# Patient Record
Sex: Female | Born: 1965
Health system: Southern US, Community
[De-identification: ages and names within clinical notes are randomized; demographics above are authoritative.]

## PROBLEM LIST (undated history)

## (undated) DIAGNOSIS — M1990 Unspecified osteoarthritis, unspecified site: Secondary | ICD-10-CM

## (undated) DIAGNOSIS — E785 Hyperlipidemia, unspecified: Secondary | ICD-10-CM

## (undated) DIAGNOSIS — C50919 Malignant neoplasm of unspecified site of unspecified female breast: Secondary | ICD-10-CM

## (undated) DIAGNOSIS — I10 Essential (primary) hypertension: Secondary | ICD-10-CM

## (undated) HISTORY — DX: Hyperlipidemia, unspecified: E78.5

## (undated) HISTORY — DX: Malignant neoplasm of unspecified site of unspecified female breast: C50.919

## (undated) HISTORY — DX: Unspecified osteoarthritis, unspecified site: M19.90

## (undated) HISTORY — DX: Essential (primary) hypertension: I10

---

## 2000-03-01 ENCOUNTER — Encounter: Payer: Self-pay | Admitting: Family Medicine

## 2000-03-01 ENCOUNTER — Ambulatory Visit (HOSPITAL_COMMUNITY): Admission: RE | Admit: 2000-03-01 | Discharge: 2000-03-01 | Payer: Self-pay | Admitting: Family Medicine

## 2001-04-15 ENCOUNTER — Encounter: Admission: RE | Admit: 2001-04-15 | Discharge: 2001-04-15 | Payer: Self-pay | Admitting: Obstetrics & Gynecology

## 2001-05-20 ENCOUNTER — Encounter: Admission: RE | Admit: 2001-05-20 | Discharge: 2001-05-20 | Payer: Self-pay | Admitting: Obstetrics & Gynecology

## 2002-07-07 ENCOUNTER — Encounter: Payer: Self-pay | Admitting: *Deleted

## 2002-07-07 ENCOUNTER — Inpatient Hospital Stay (HOSPITAL_COMMUNITY): Admission: AD | Admit: 2002-07-07 | Discharge: 2002-07-10 | Payer: Self-pay | Admitting: *Deleted

## 2002-07-09 ENCOUNTER — Encounter (INDEPENDENT_AMBULATORY_CARE_PROVIDER_SITE_OTHER): Payer: Self-pay | Admitting: Specialist

## 2002-07-16 ENCOUNTER — Encounter: Admission: RE | Admit: 2002-07-16 | Discharge: 2002-07-16 | Payer: Self-pay | Admitting: Obstetrics and Gynecology

## 2002-07-30 ENCOUNTER — Encounter: Admission: RE | Admit: 2002-07-30 | Discharge: 2002-07-30 | Payer: Self-pay | Admitting: Obstetrics and Gynecology

## 2002-08-20 ENCOUNTER — Encounter: Admission: RE | Admit: 2002-08-20 | Discharge: 2002-08-20 | Payer: Self-pay | Admitting: Obstetrics and Gynecology

## 2002-10-29 ENCOUNTER — Encounter: Admission: RE | Admit: 2002-10-29 | Discharge: 2002-10-29 | Payer: Self-pay | Admitting: Obstetrics and Gynecology

## 2002-11-24 ENCOUNTER — Encounter: Admission: RE | Admit: 2002-11-24 | Discharge: 2002-11-24 | Payer: Self-pay | Admitting: Obstetrics and Gynecology

## 2003-05-11 ENCOUNTER — Encounter: Admission: RE | Admit: 2003-05-11 | Discharge: 2003-05-11 | Payer: Self-pay | Admitting: Obstetrics and Gynecology

## 2003-07-15 ENCOUNTER — Encounter: Admission: RE | Admit: 2003-07-15 | Discharge: 2003-07-15 | Payer: Self-pay | Admitting: Obstetrics and Gynecology

## 2003-08-26 ENCOUNTER — Encounter: Admission: RE | Admit: 2003-08-26 | Discharge: 2003-08-26 | Payer: Self-pay | Admitting: Obstetrics and Gynecology

## 2003-10-07 ENCOUNTER — Encounter: Admission: RE | Admit: 2003-10-07 | Discharge: 2003-10-07 | Payer: Self-pay | Admitting: Obstetrics and Gynecology

## 2004-01-11 ENCOUNTER — Encounter: Admission: RE | Admit: 2004-01-11 | Discharge: 2004-01-11 | Payer: Self-pay | Admitting: Obstetrics and Gynecology

## 2004-01-13 ENCOUNTER — Encounter: Admission: RE | Admit: 2004-01-13 | Discharge: 2004-01-13 | Payer: Self-pay | Admitting: Obstetrics and Gynecology

## 2004-02-29 ENCOUNTER — Encounter: Admission: RE | Admit: 2004-02-29 | Discharge: 2004-02-29 | Payer: Self-pay | Admitting: Obstetrics and Gynecology

## 2004-05-02 ENCOUNTER — Encounter: Admission: RE | Admit: 2004-05-02 | Discharge: 2004-05-02 | Payer: Self-pay | Admitting: Obstetrics and Gynecology

## 2004-08-10 ENCOUNTER — Inpatient Hospital Stay (HOSPITAL_COMMUNITY): Admission: AD | Admit: 2004-08-10 | Discharge: 2004-08-10 | Payer: Self-pay | Admitting: Obstetrics and Gynecology

## 2004-08-15 ENCOUNTER — Ambulatory Visit (HOSPITAL_COMMUNITY): Admission: RE | Admit: 2004-08-15 | Discharge: 2004-08-15 | Payer: Self-pay | Admitting: Obstetrics and Gynecology

## 2004-08-15 ENCOUNTER — Ambulatory Visit: Payer: Self-pay | Admitting: Obstetrics and Gynecology

## 2004-08-15 IMAGING — US US PELVIS COMPLETE MODIFY
1 series · 14 of 25 positions shown · non-contrast
Comparison: none

CLINICAL DATA: Pelvic pain.  
 ULTRASOUND OF THE PELVIS 
 Both transabdominal and transvaginal ultrasound of the pelvis were performed.  The uterus is normal in size measuring 8.9 cm sagittally with a depth of 3.1 cm and width of 4.7 cm.  The endometrium is normal measuring 5.9 mm in thickness.  No myometrial abnormality is seen.  Both ovaries are normal in size.  No free fluid is seen.  
 IMPRESSION
 Negative pelvic ultrasound.

[Series 1: us pelvis complete modify · 0.33mm/px · 14 of 43 slices shown]
[im 1/43]
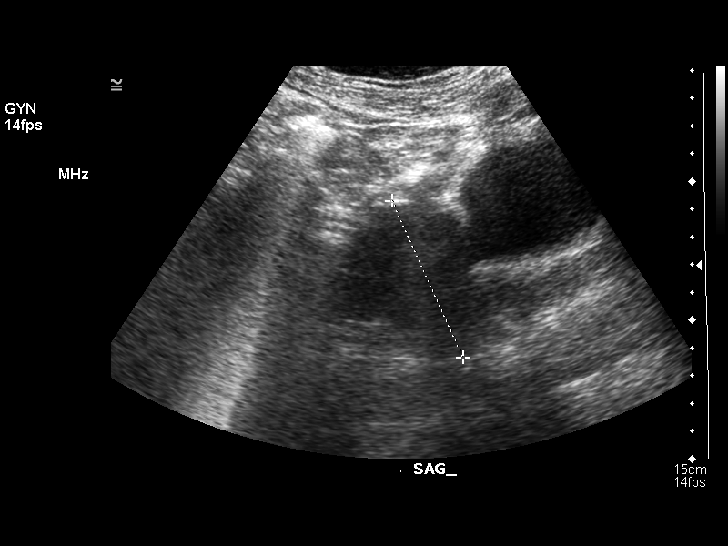
[im 4/43]
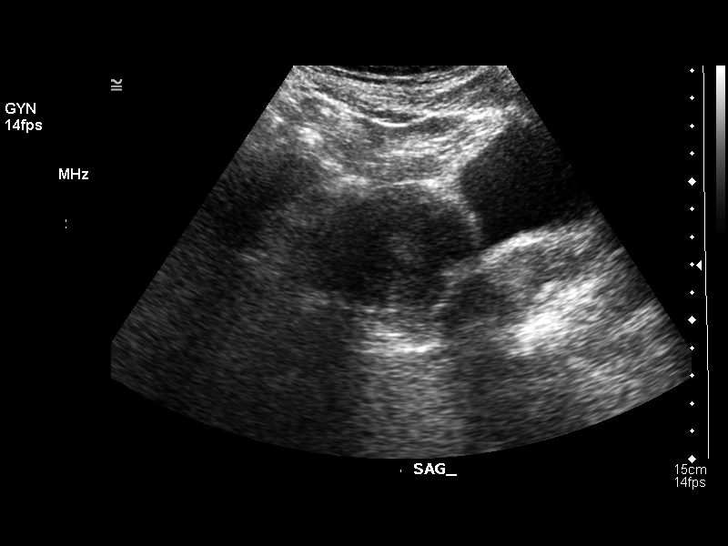
[im 8/43]
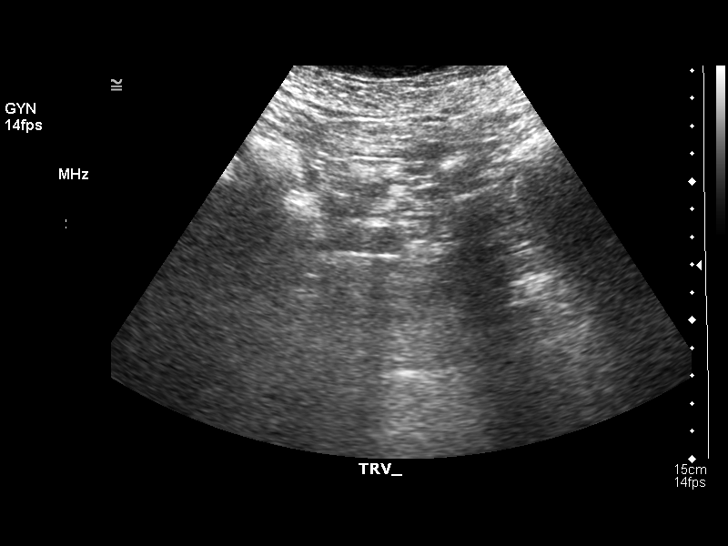
[im 11/43]
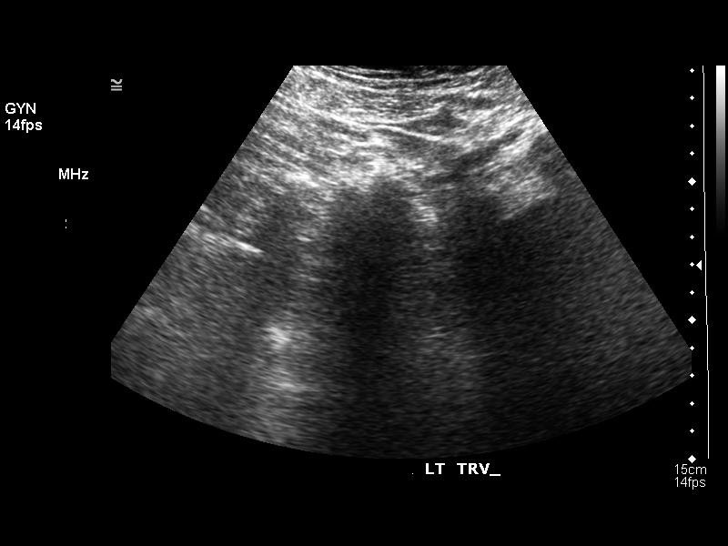
[im 15/43]
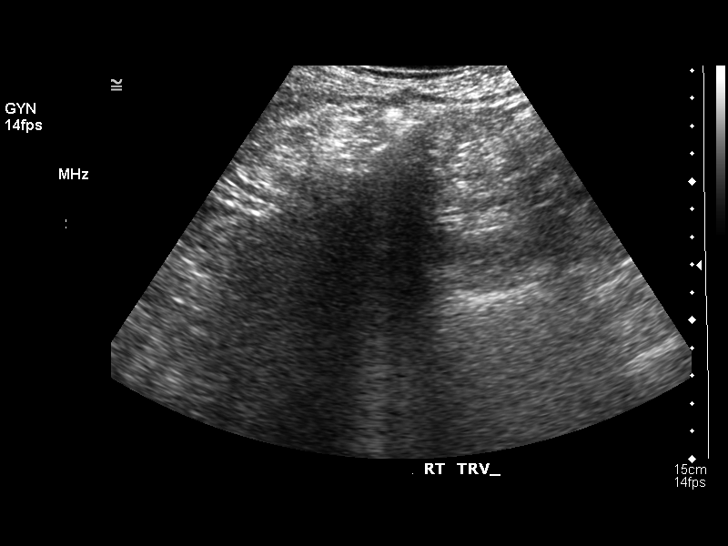
[im 16/43]
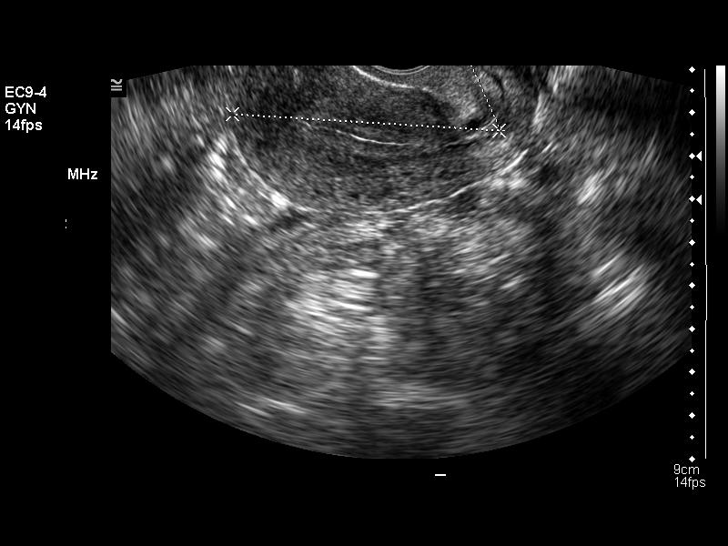
[im 20/43]
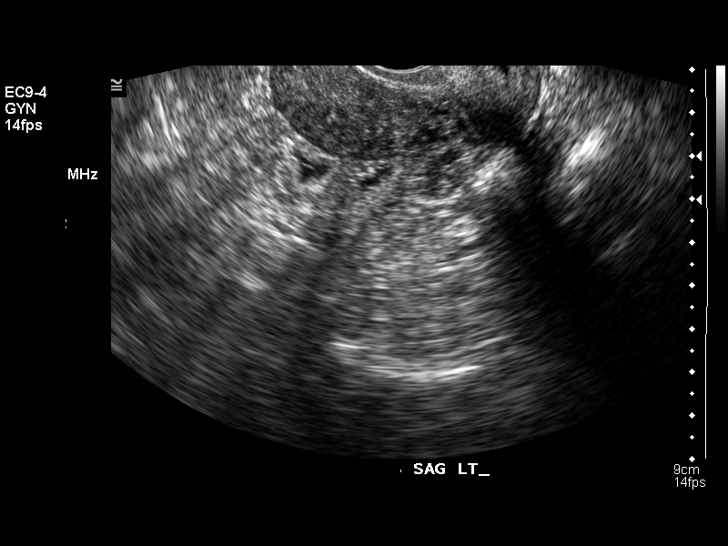
[im 23/43]
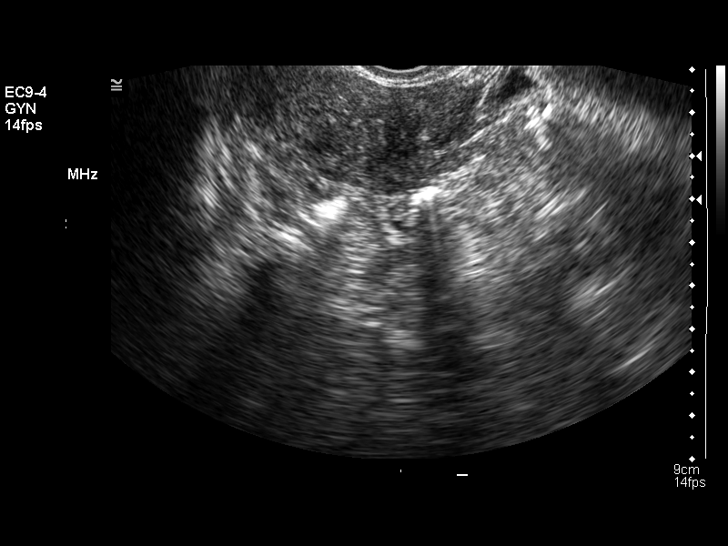
[im 27/43]
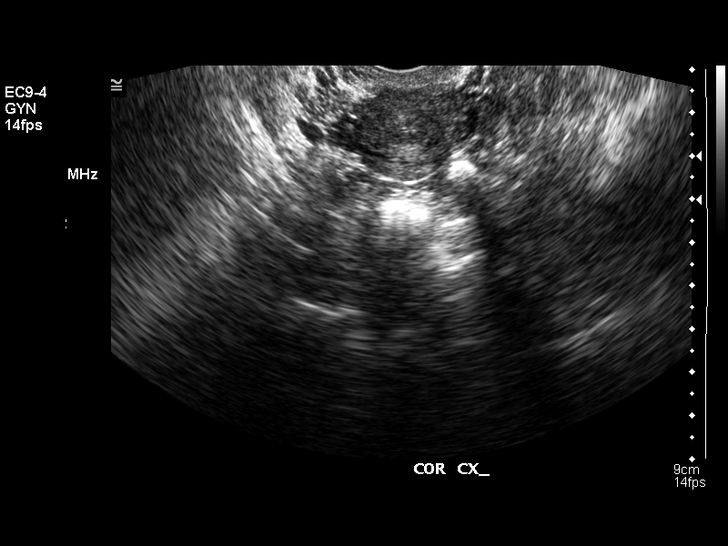
[im 29/43]
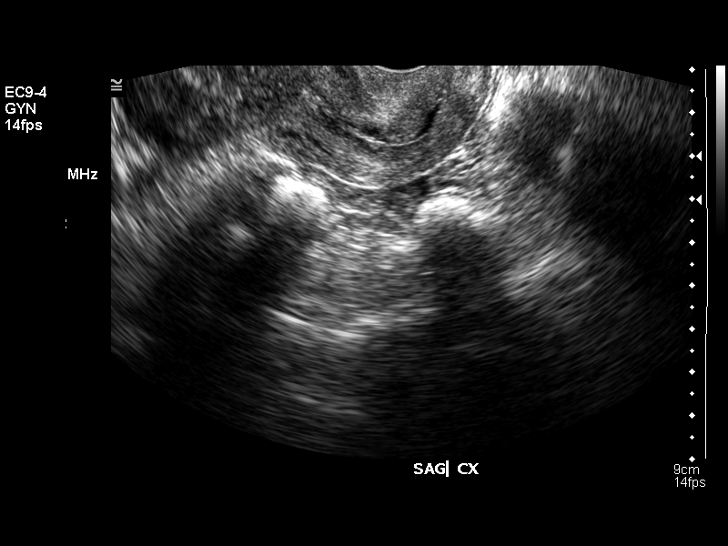
[im 32/43]
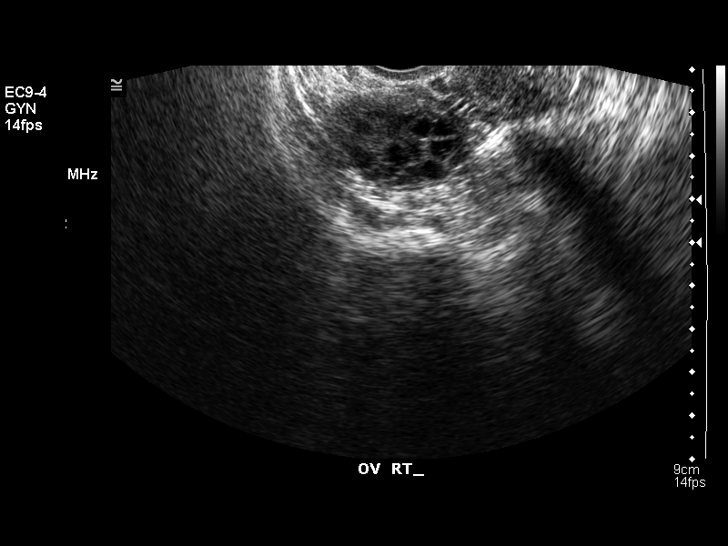
[im 36/43]
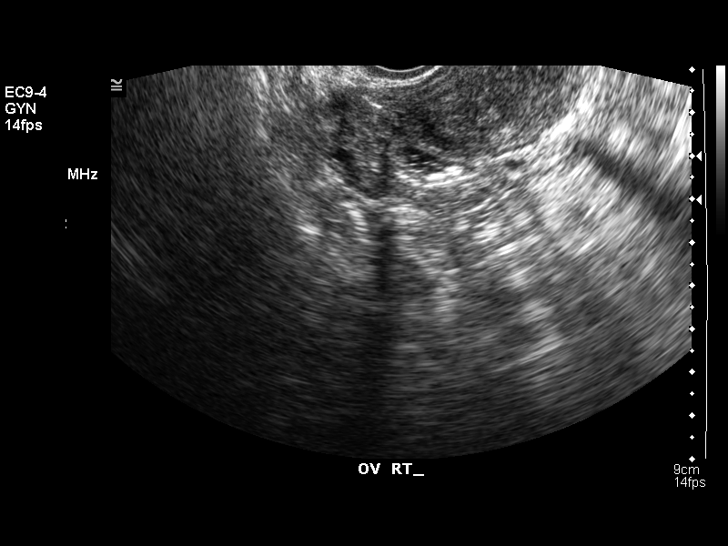
[im 39/43]
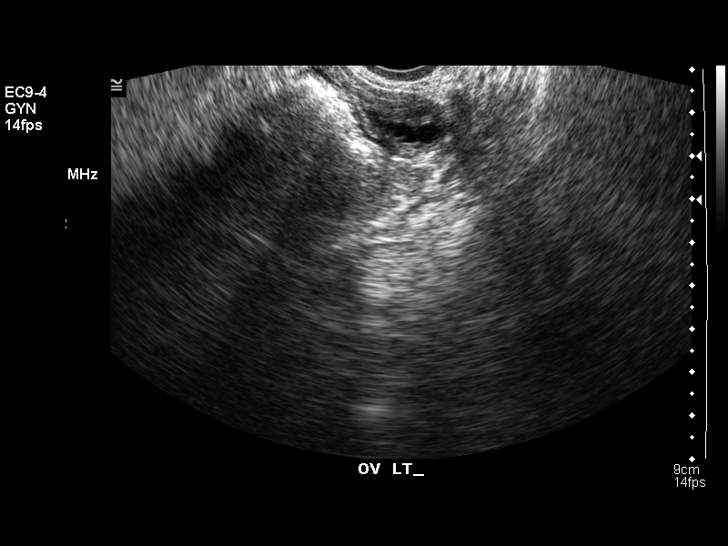
[im 43/43]
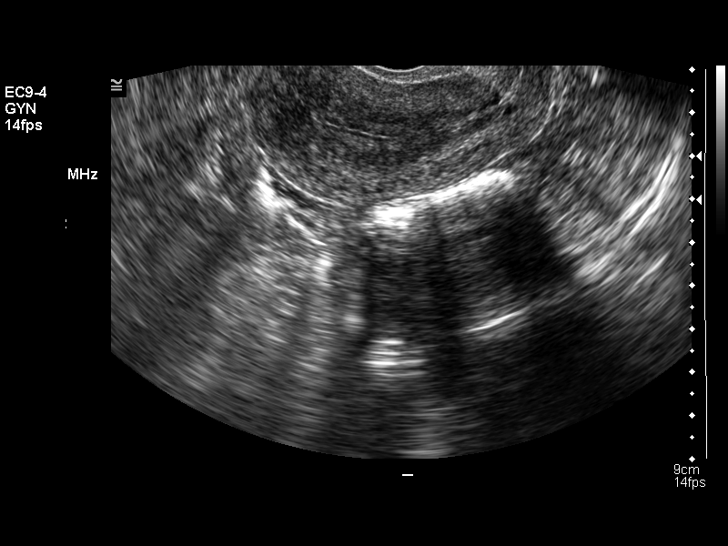

[14 of 25 positions shown; findings below may reference images not displayed]

## 2005-01-16 ENCOUNTER — Emergency Department (HOSPITAL_COMMUNITY): Admission: EM | Admit: 2005-01-16 | Discharge: 2005-01-17 | Payer: Self-pay | Admitting: Emergency Medicine

## 2005-02-17 ENCOUNTER — Emergency Department (HOSPITAL_COMMUNITY): Admission: EM | Admit: 2005-02-17 | Discharge: 2005-02-17 | Payer: Self-pay | Admitting: Emergency Medicine

## 2005-09-24 ENCOUNTER — Emergency Department (HOSPITAL_COMMUNITY): Admission: EM | Admit: 2005-09-24 | Discharge: 2005-09-24 | Payer: Self-pay | Admitting: Emergency Medicine

## 2005-09-24 IMAGING — CR DG CHEST 2V
2 series · 2 of 2 positions shown · non-contrast
Comparison: None.

CLINICAL DATA: Cough.
CHEST- 2 VIEW:

[view not recorded (1 of 2)]
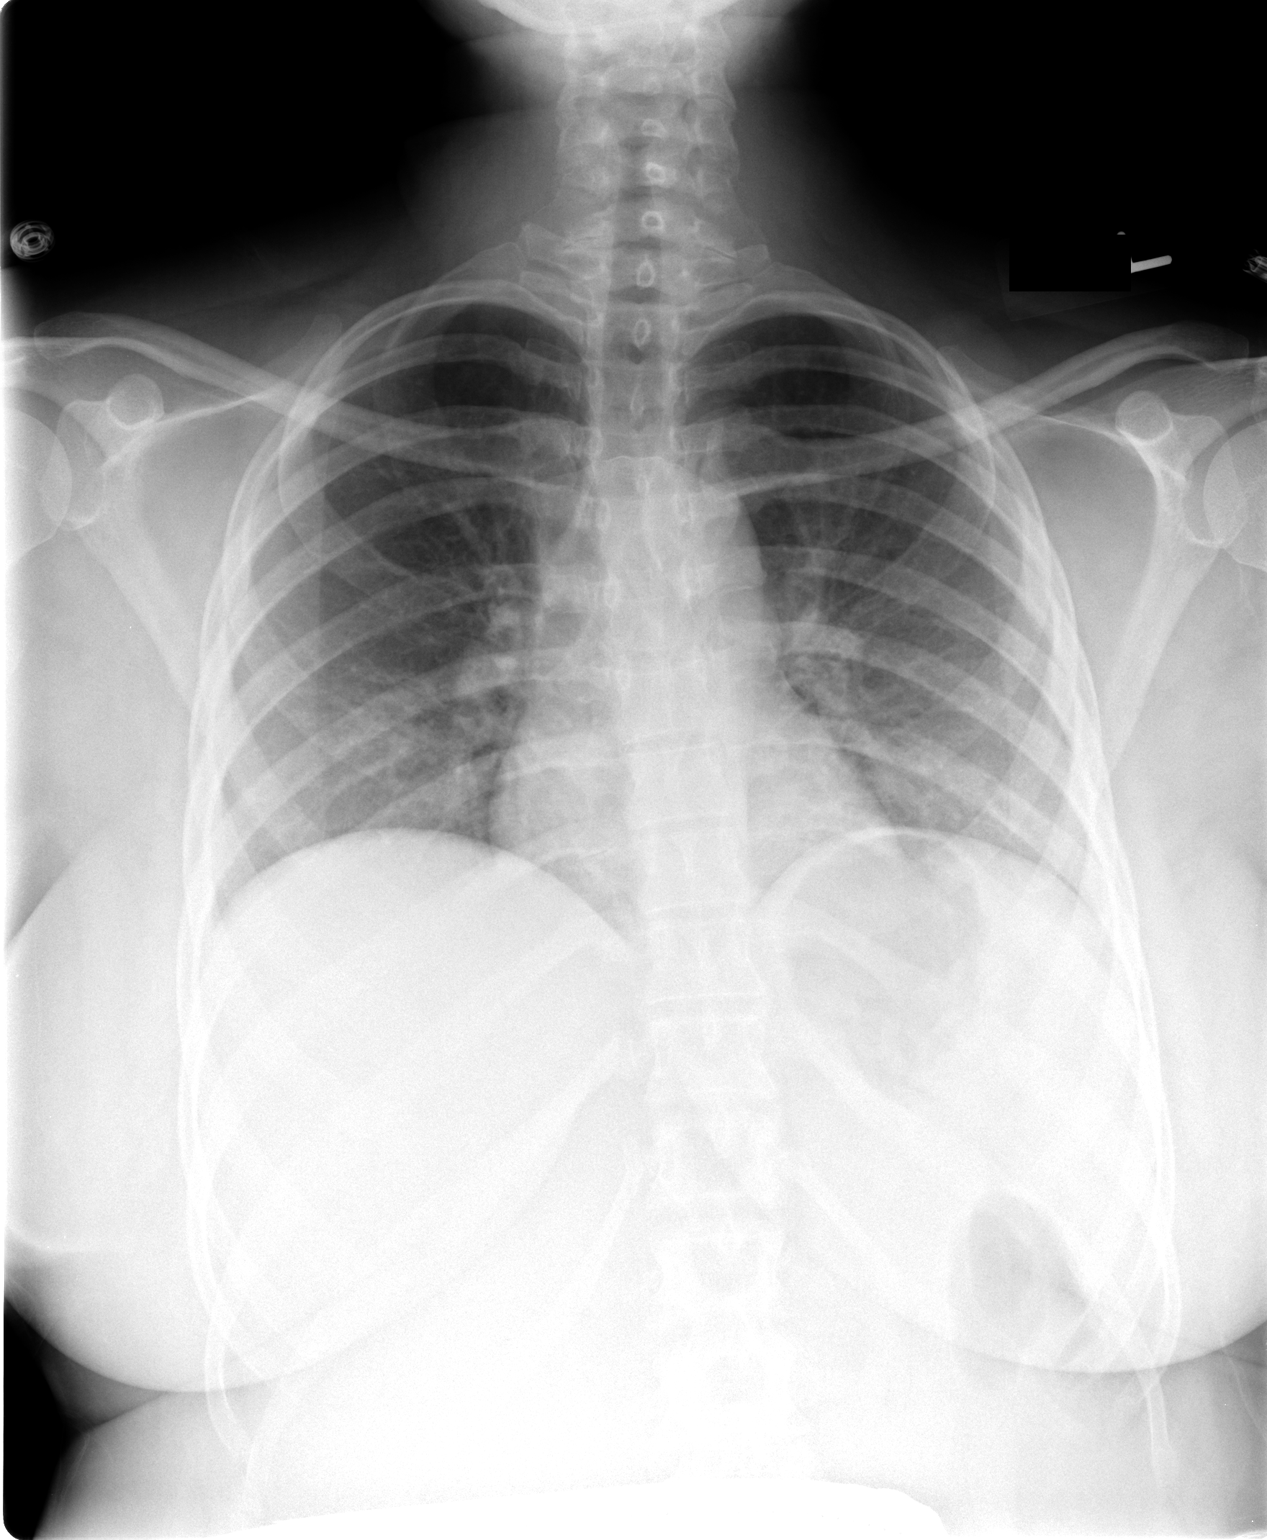

[view not recorded (2 of 2)]
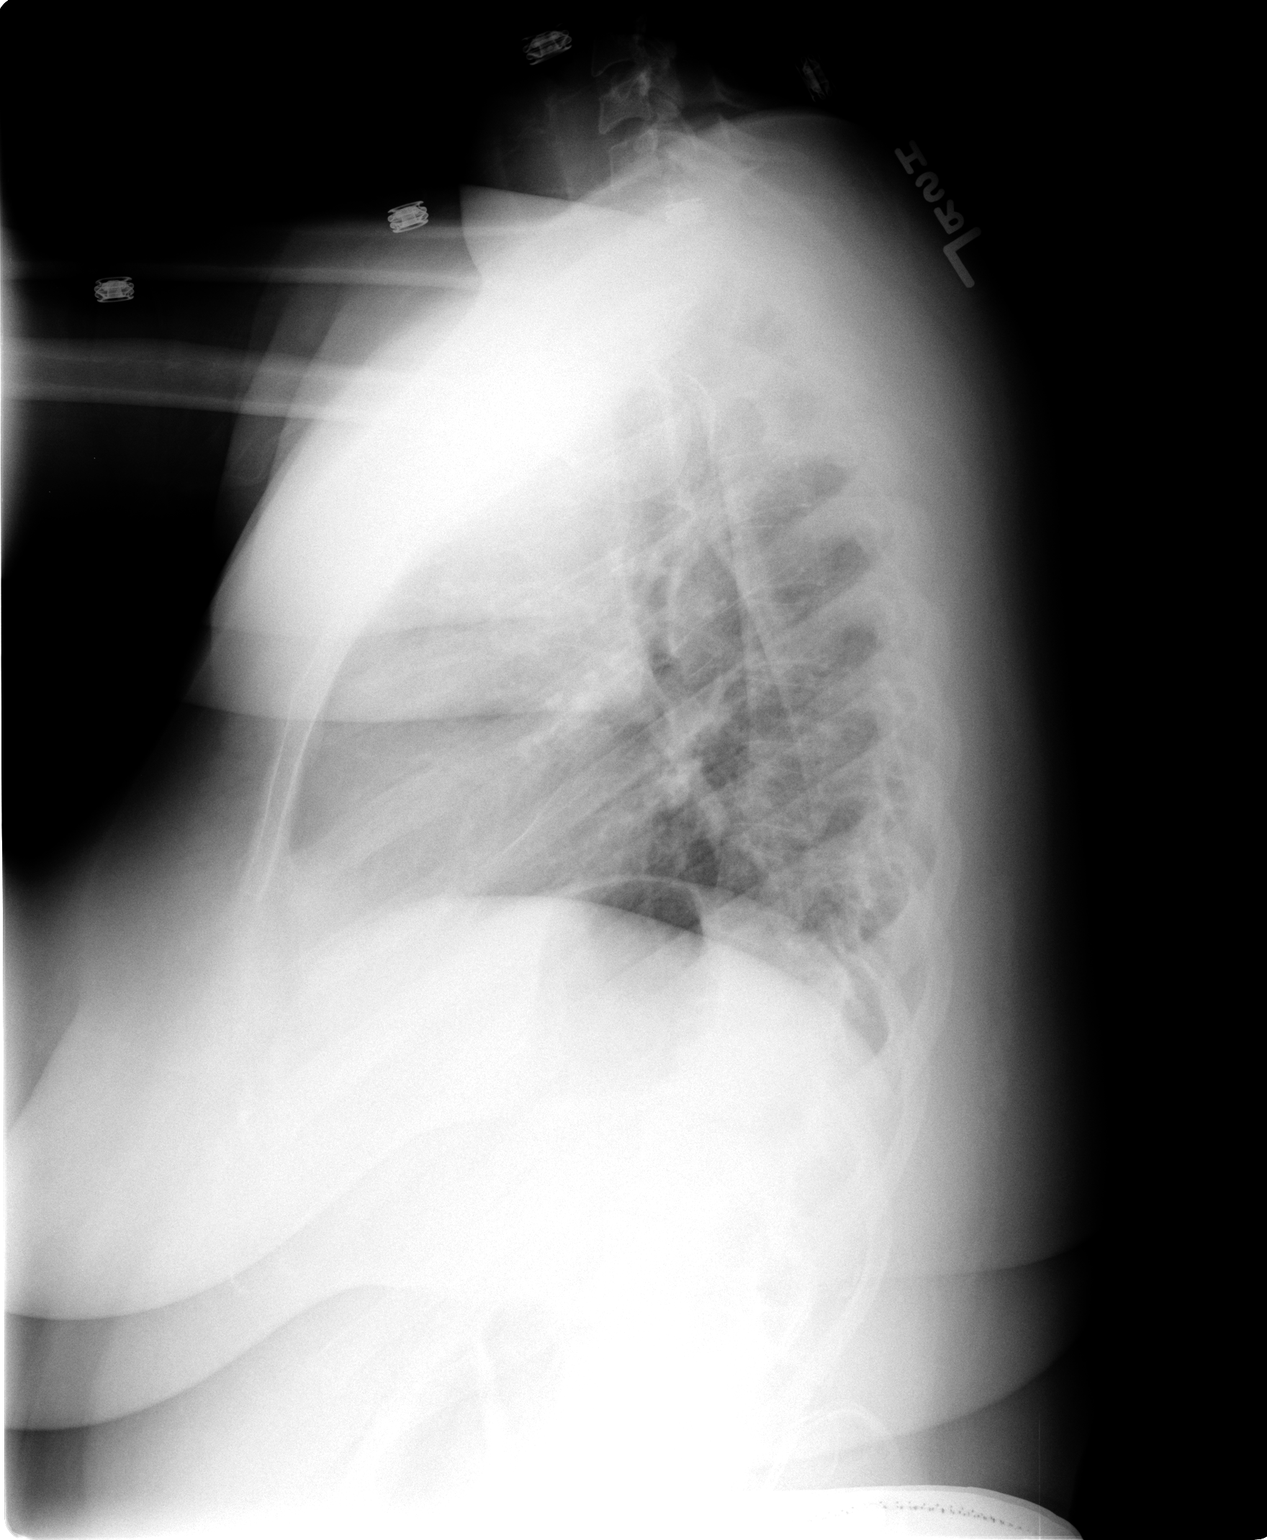

[2 of 2 positions shown; findings below may reference images not displayed]

FINDINGS: Low level of inspiration. Heart and lungs within normal limits considering. Osseous structures intact.
IMPRESSION: Suboptimal  inspiration ? no active disease.

## 2006-12-25 ENCOUNTER — Inpatient Hospital Stay (HOSPITAL_COMMUNITY): Admission: AD | Admit: 2006-12-25 | Discharge: 2006-12-26 | Payer: Self-pay | Admitting: Obstetrics and Gynecology

## 2006-12-25 IMAGING — US US TRANSVAGINAL NON-OB
1 series · 14 of 25 positions shown · non-contrast
Comparison: [DATE].

CLINICAL DATA: Vaginal bleeding and severe pelvic pain. 
 TRANSABDOMINAL AND TRANSVAGINAL PELVIC ULTRASOUND:
TECHNIQUE: Both transabdominal and transvaginal ultrasound examinations of the pelvis were performed including evaluation of the uterus, ovaries, adnexal regions, and pelvic cul-de-sac.

[Series 1: us transvaginal non-ob · 0.28mm/px · 14 of 30 slices shown]
[im 1/30]
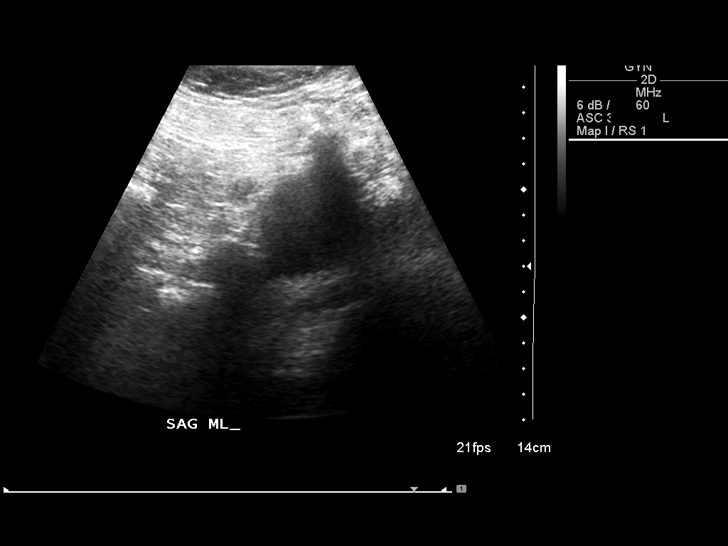
[im 3/30]
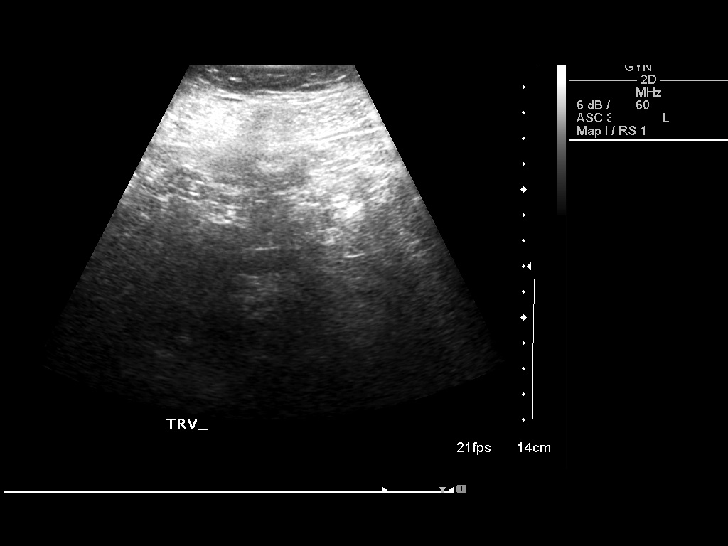
[im 5/30]
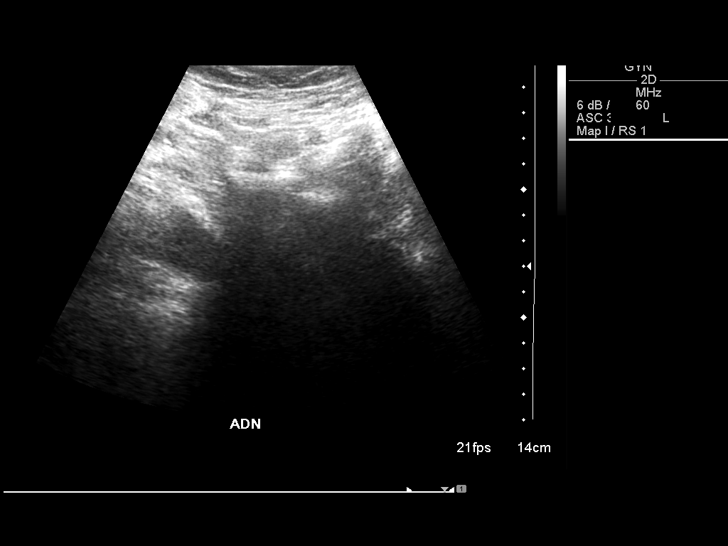
[im 8/30]
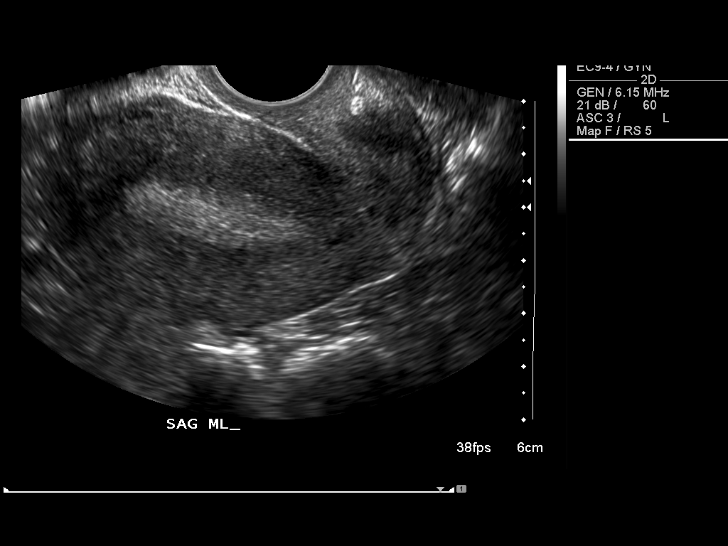
[im 10/30]
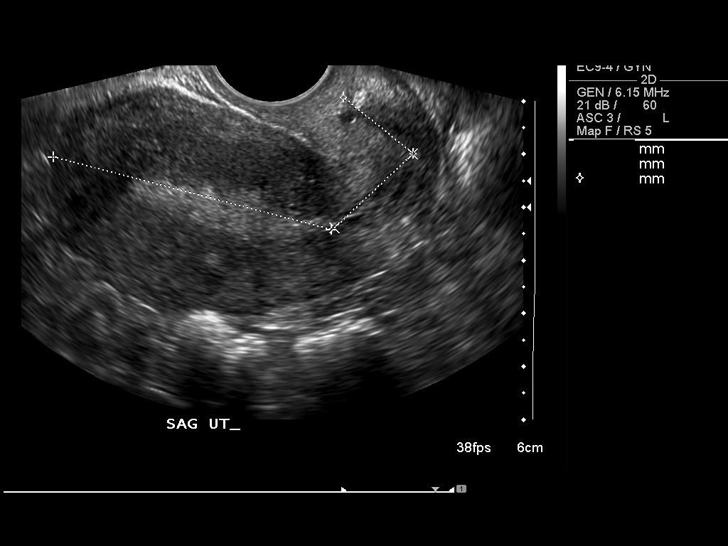
[im 11/30]
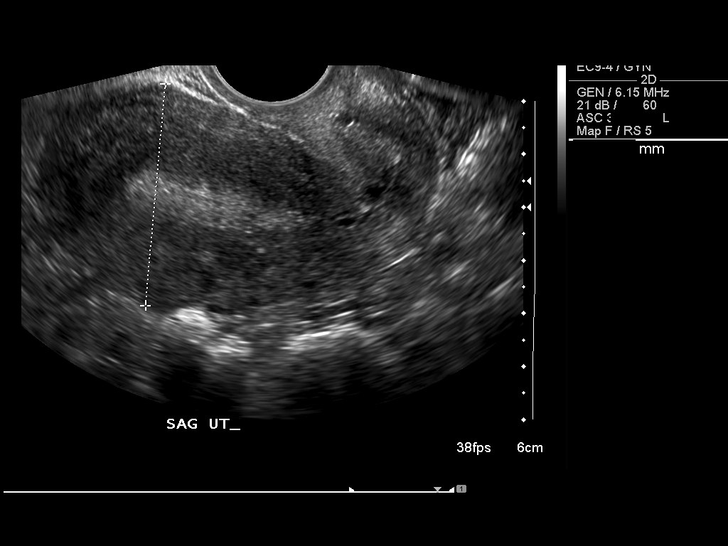
[im 14/30]
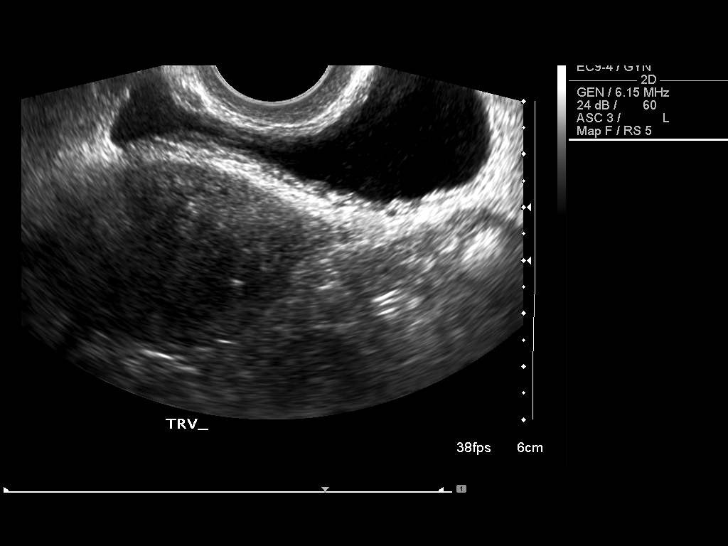
[im 16/30]
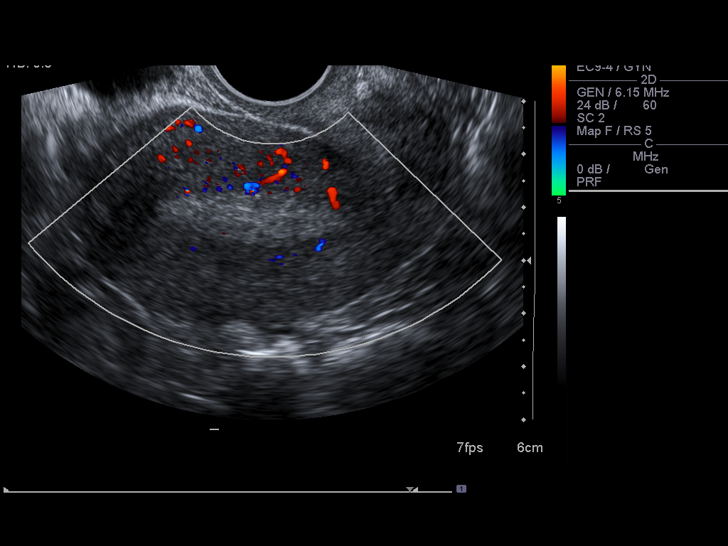
[im 19/30]
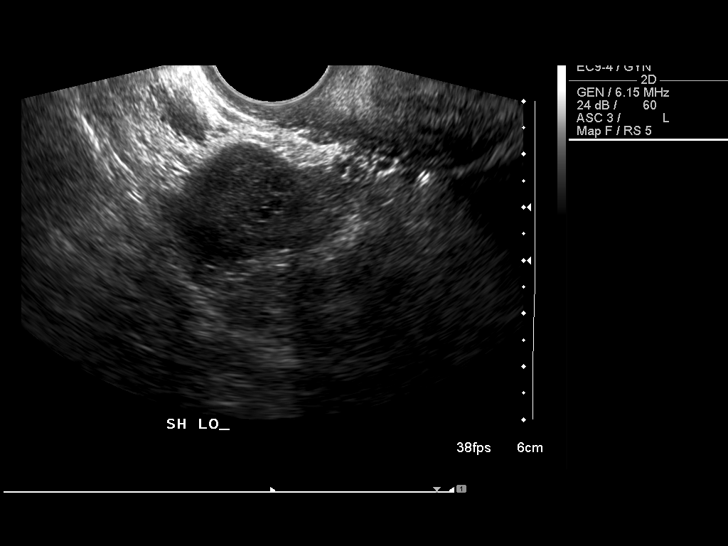
[im 20/30]
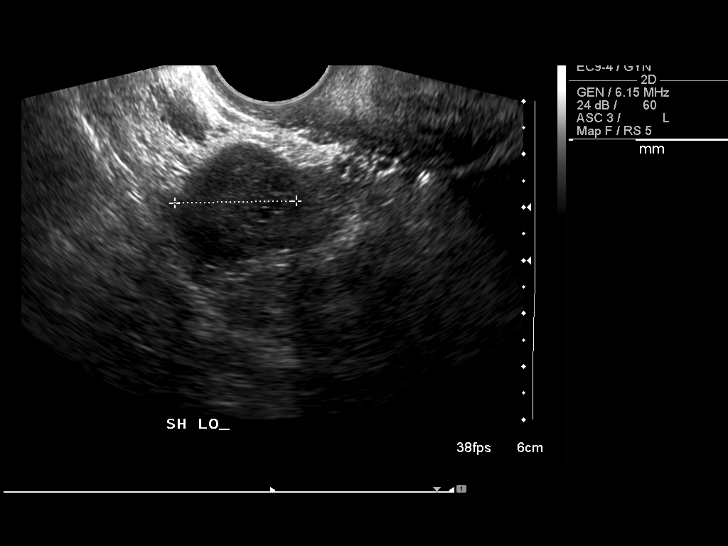
[im 22/30]
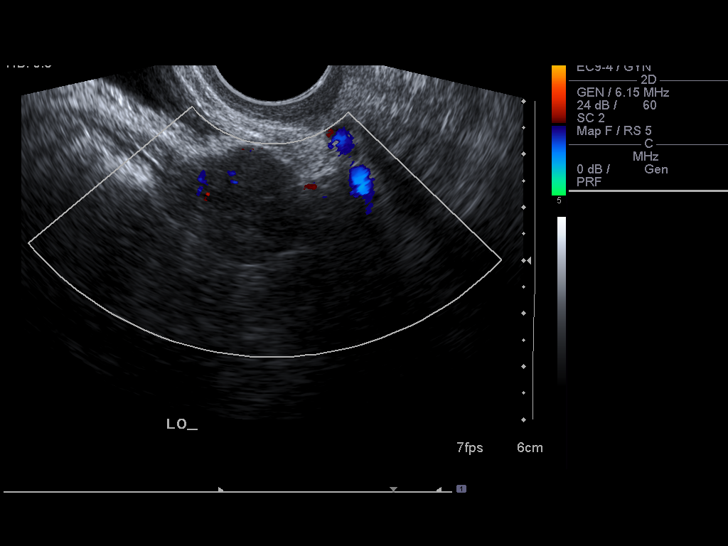
[im 25/30]
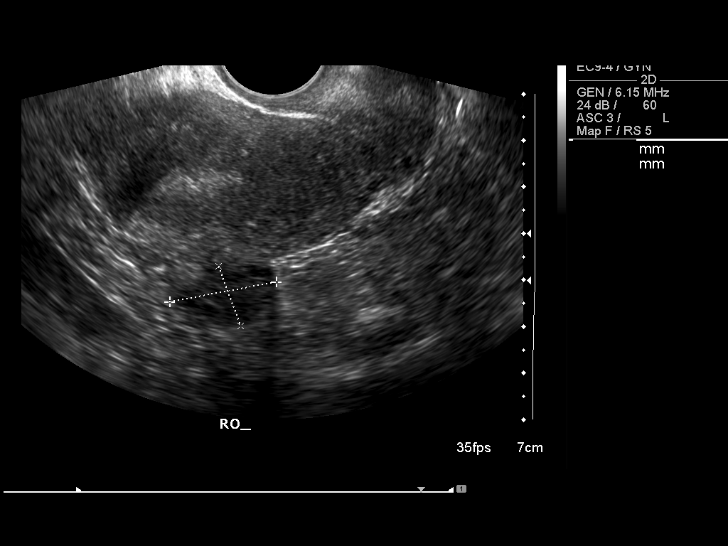
[im 27/30]
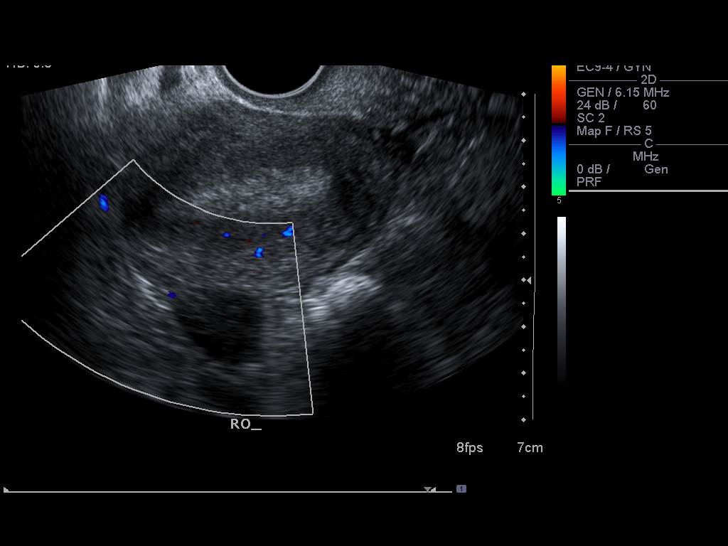
[im 30/30]
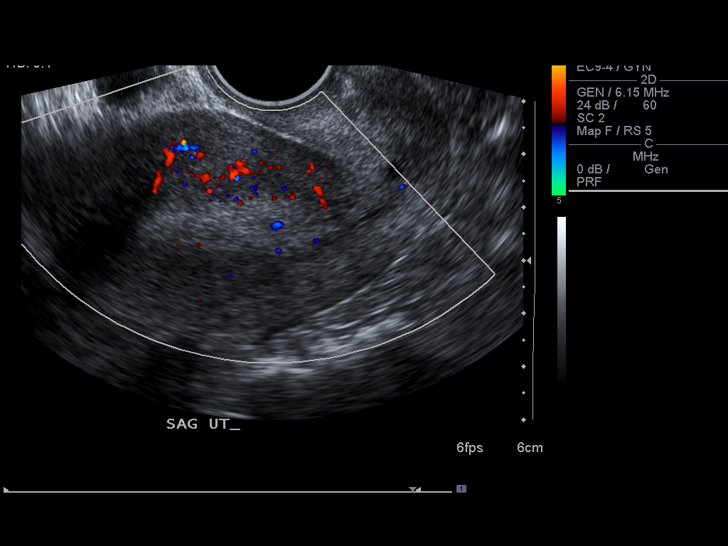

[14 of 25 positions shown; findings below may reference images not displayed]

FINDINGS: The uterus is unremarkable and anteverted measuring 9.1 x 4.2 x 5.1 cm. The endometrial stripe is homogeneous measuring 9 mm in greatest diameter.  No focal uterine lesions are identified.  The ovaries bilaterally are normal.  There is no evidence of adnexal mass or free fluid.
IMPRESSION: Normal pelvic ultrasound.

## 2007-01-02 ENCOUNTER — Ambulatory Visit (HOSPITAL_COMMUNITY): Admission: RE | Admit: 2007-01-02 | Discharge: 2007-01-02 | Payer: Self-pay | Admitting: Obstetrics and Gynecology

## 2007-01-02 IMAGING — RF DG HYSTEROGRAM
5 series · 5 of 5 positions shown · IV contrast (omnipaque)
Comparison: none

CLINICAL DATA: Infertility with oligomenorrhea.  
 HYSTEROGRAM:
TECHNIQUE: Following sterile cleansing of the external cervical os with Betadine, hysterosalpingography was performed via placement of a 5-french hysterosalpingography catheter into the lower uterine segment where it was stabilized with an end-catheter balloon.  The end-catheter balloon was lowered at the conclusion of the exam to allow evaluation of the lower uterine segment portion of the endometrial canal.  Approximately 18 cc Omnipaque 300 was injected into the endometrial canal without difficulty.

[Series 1: run · 1 of 1 slices shown (1 of 5)]
[im 1/1]
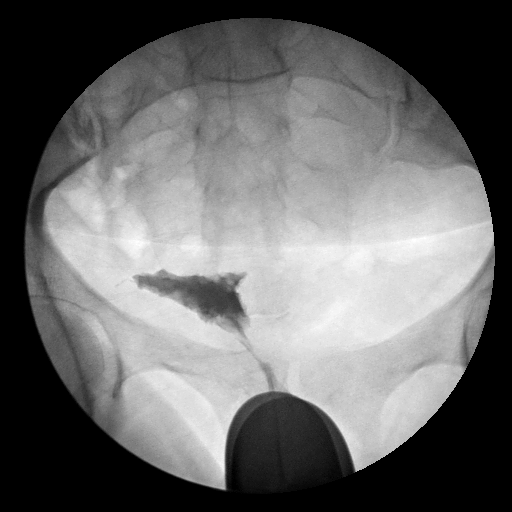

[Series 2: run · 1 of 1 slices shown (2 of 5)]
[im 1/1]
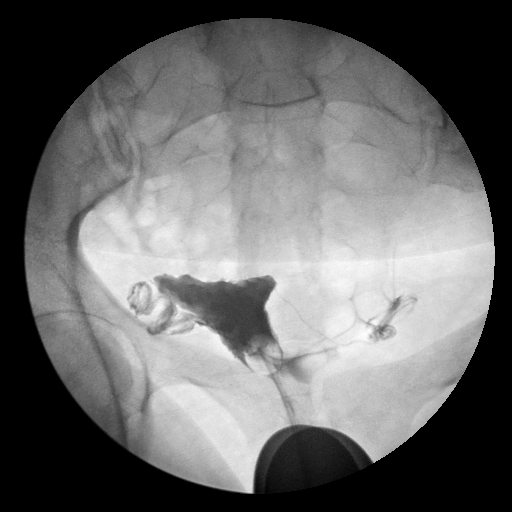

[Series 3: run · 1 of 1 slices shown (3 of 5)]
[im 1/1]
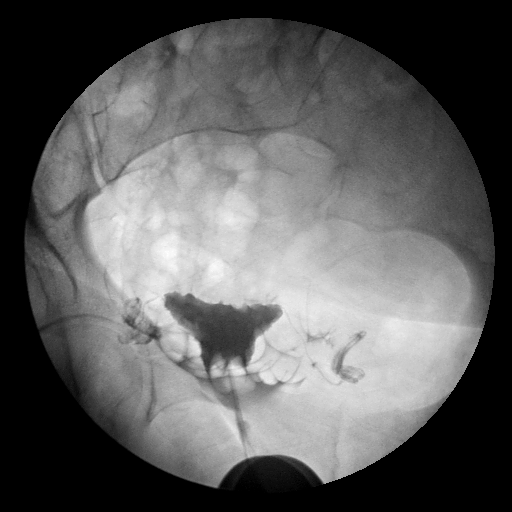

[Series 4: run · 1 of 1 slices shown (4 of 5)]
[im 1/1]
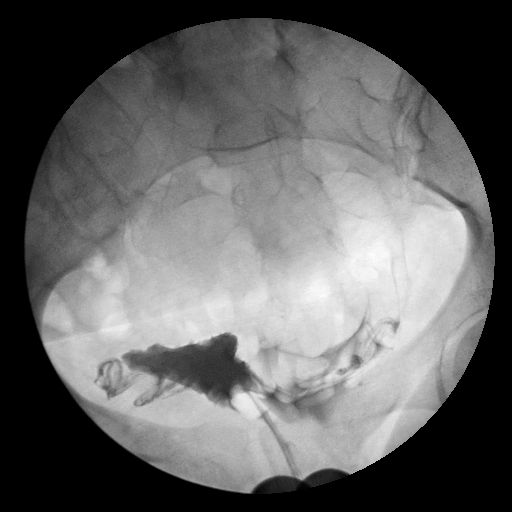

[Series 5: run · 1 of 1 slices shown (5 of 5)]
[im 1/1]
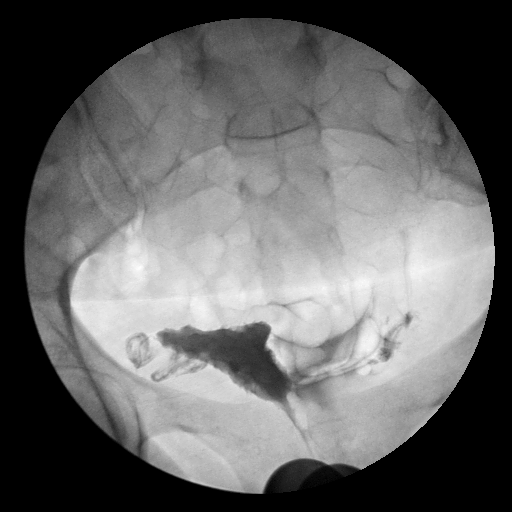

[5 of 5 positions shown; findings below may reference images not displayed]

FINDINGS: The endometrial morphology demonstrates mild diffuse irregularity of the endometrial contour.  No evidence for focal filling defects or bridging linear defects are seen to suggest synechia, polyps, or submucosal myomas.  The mild diffuse irregularity is of questionable significance in this patient with no history of prior instrumentation and likely represents a normal variant.  
 Both fallopian tubes have a normal morphology and bilateral free intraperitoneal spill was documented.  No evidence for loculation of contrast was seen within the pelvis to suggest the presence of peritubal or periovarian adhesions.
IMPRESSION: Mild diffuse contour irregularity of the endometrial canal of questionable significance.  Please see above report for discussion.  Bilateral tubal patentcy confirmed.

## 2007-06-24 ENCOUNTER — Emergency Department (HOSPITAL_COMMUNITY): Admission: EM | Admit: 2007-06-24 | Discharge: 2007-06-24 | Payer: Self-pay | Admitting: Emergency Medicine

## 2007-06-24 IMAGING — CT CT PELVIS W/ CM
3 of 5 series · 13 of 36 positions shown, 19 images · IV contrast (OMNI 300/WATER & 100 ML OMNI 300)
Comparison: None.

CLINICAL DATA: Lower abdominal pain.  Vomiting.
 ABDOMEN CT WITH CONTRAST:
TECHNIQUE: Multidetector CT imaging of the abdomen was performed following the standard protocol during bolus administration of intravenous contrast.
 Contrast:  100 cc Omnipaque 300.
TECHNIQUE: Multidetector CT imaging of the pelvis was performed following the standard protocol during bolus administration of intravenous contrast.

[Series 2: routine abdomen · axial · 0.70mm/px · z∈[-346,+4]mm · 8 of 91 slices shown, 13 images]
[im 11/91  soft-tissue]
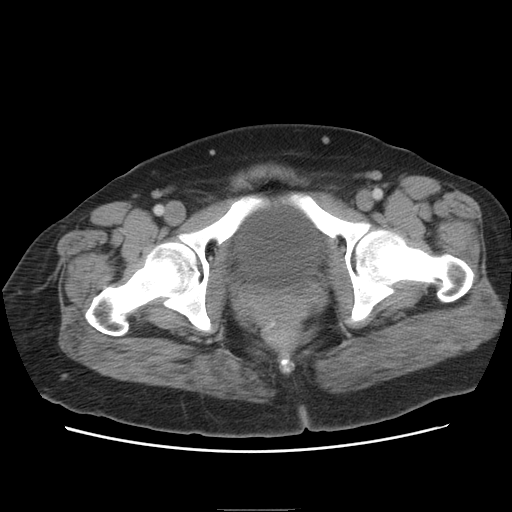
[im 11/91  bone]
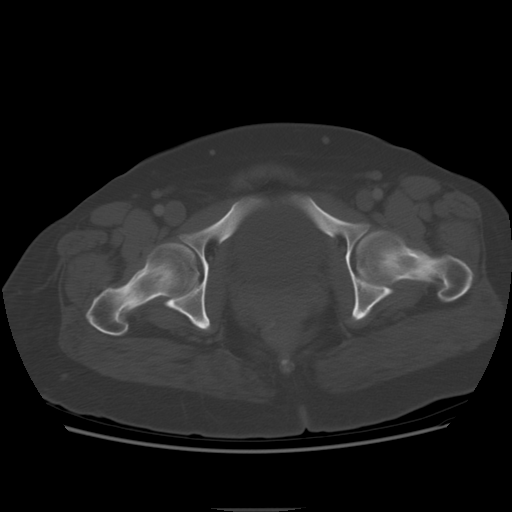
[im 21/91  soft-tissue]
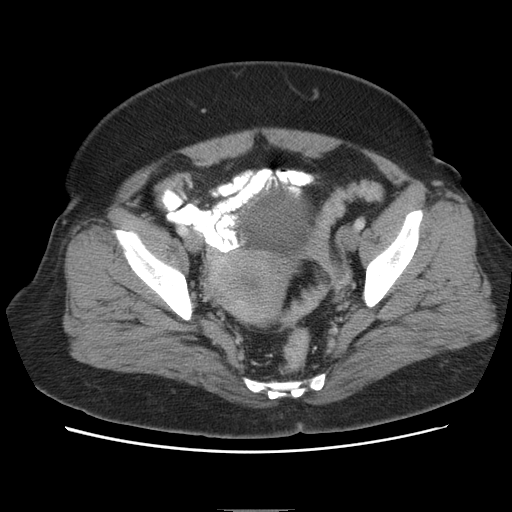
[im 31/91  soft-tissue]
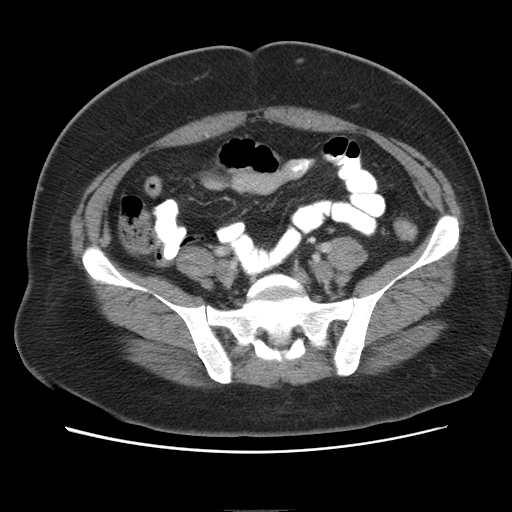
[im 41/91  soft-tissue]
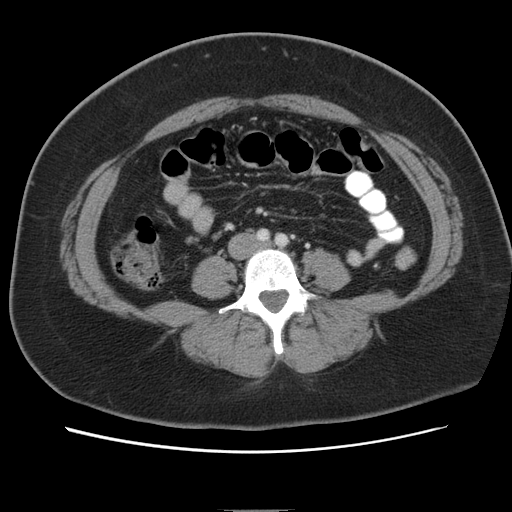
[im 51/91  soft-tissue]
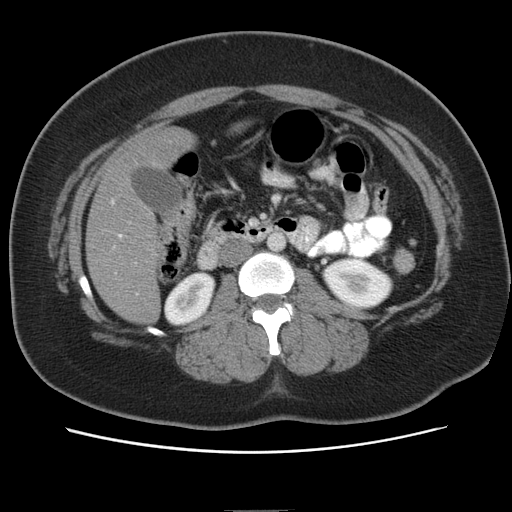
[im 51/91  lung]
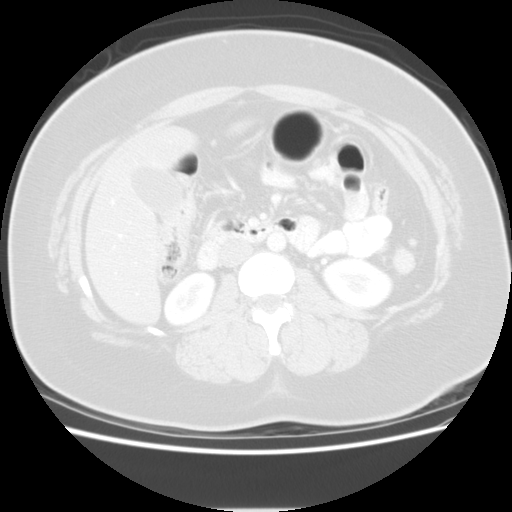
[im 61/91  soft-tissue]
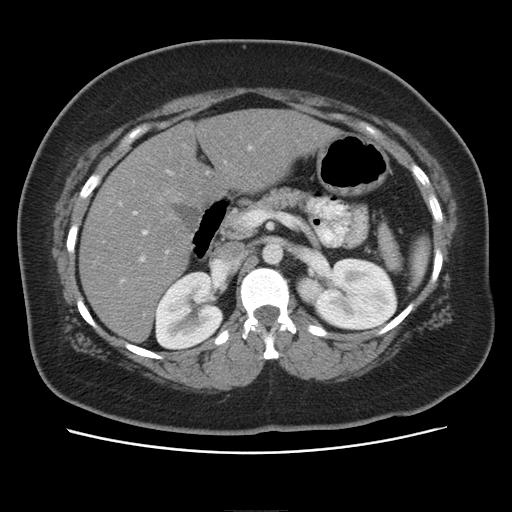
[im 61/91  lung]
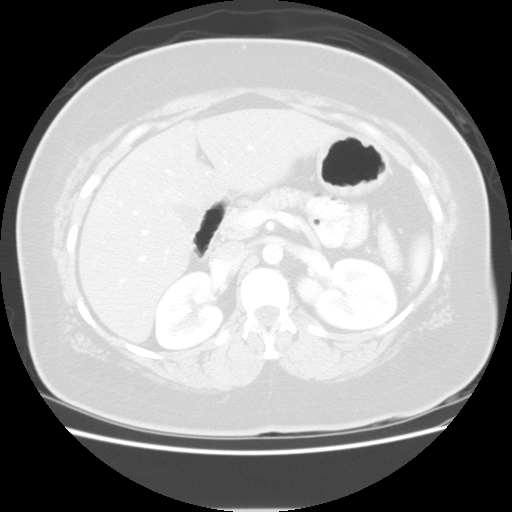
[im 71/91  soft-tissue]
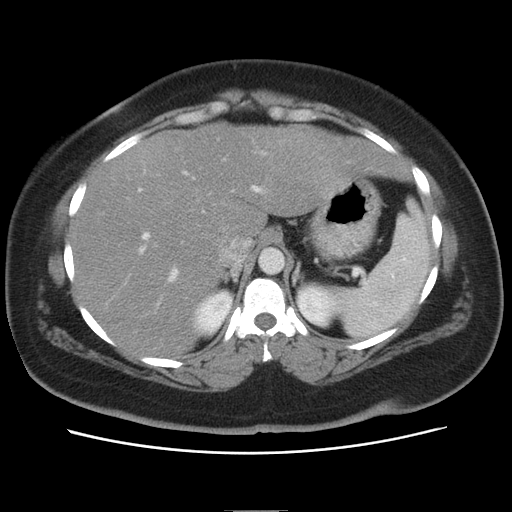
[im 71/91  lung]
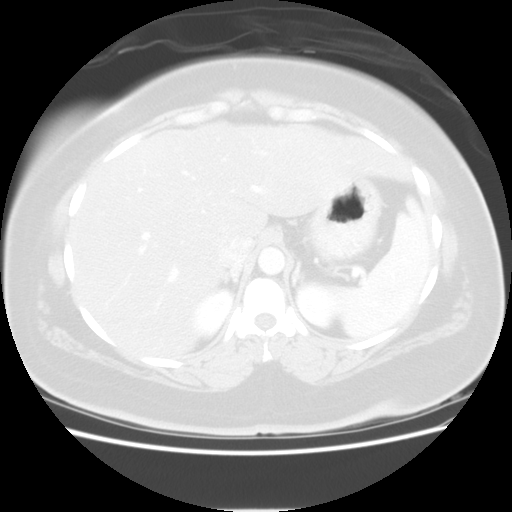
[im 81/91  soft-tissue]
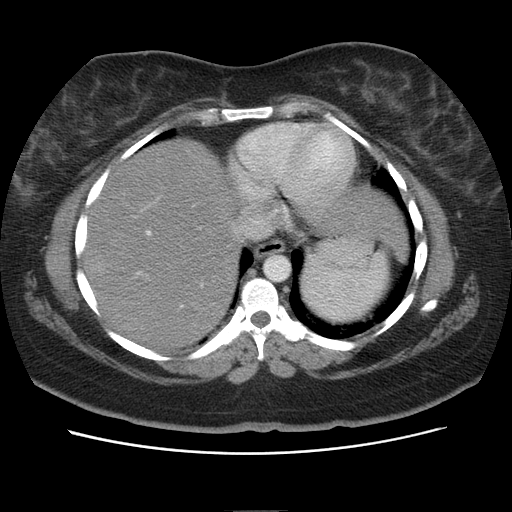
[im 81/91  lung]
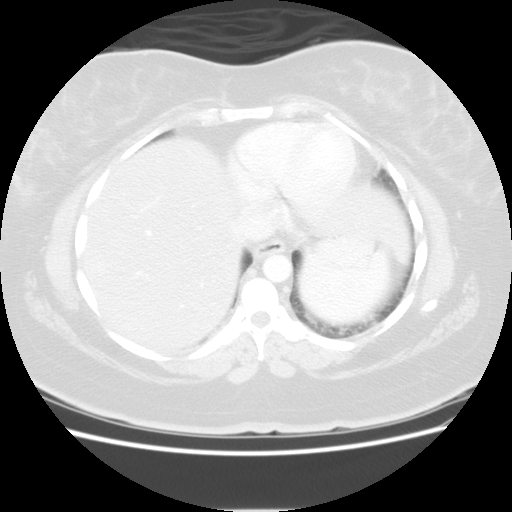

[Series 400: reformatted · sagittal · 0.93mm/px · 1 of 174 slices shown, 2 images (1 of 2)]
[im 58/174  soft-tissue]
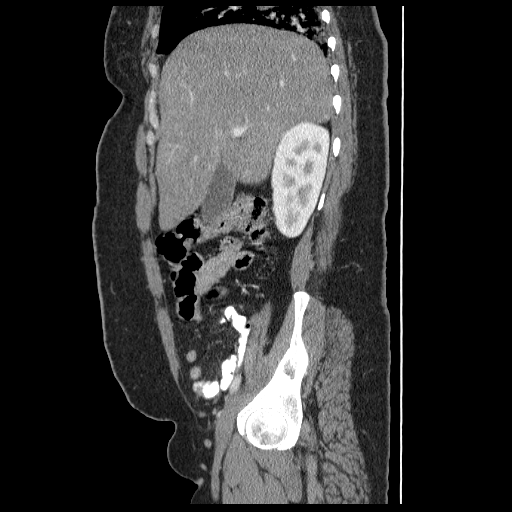
[im 58/174  bone]
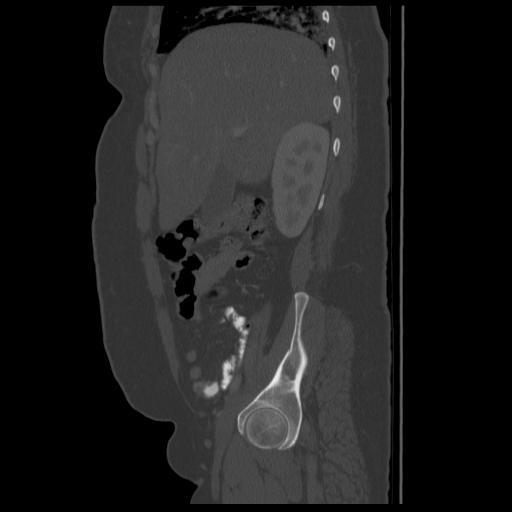

[Series 401: reformatted · coronal · 0.93mm/px · 4 of 145 slices shown (2 of 2)]
[im 11/145  soft-tissue]
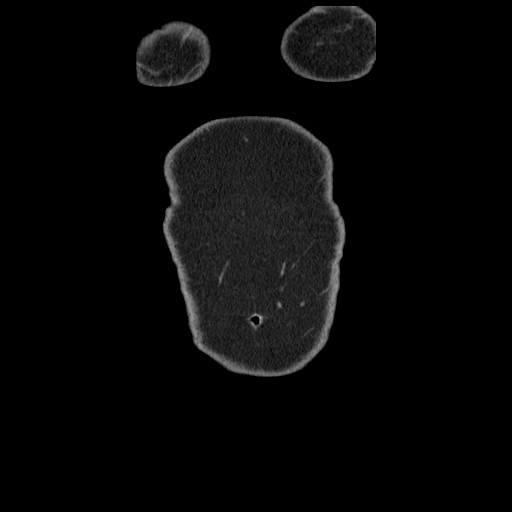
[im 31/145  soft-tissue]
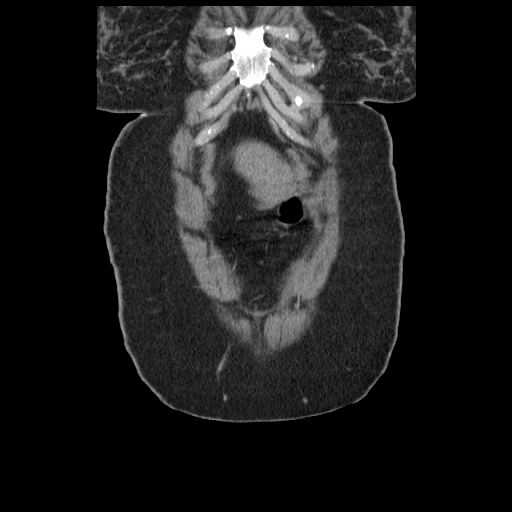
[im 52/145  soft-tissue]
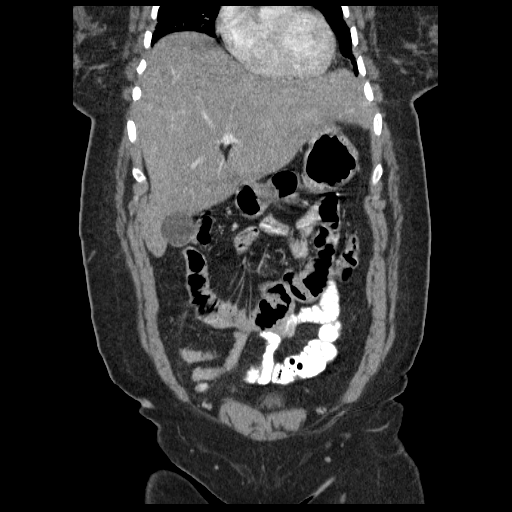
[im 62/145  soft-tissue]
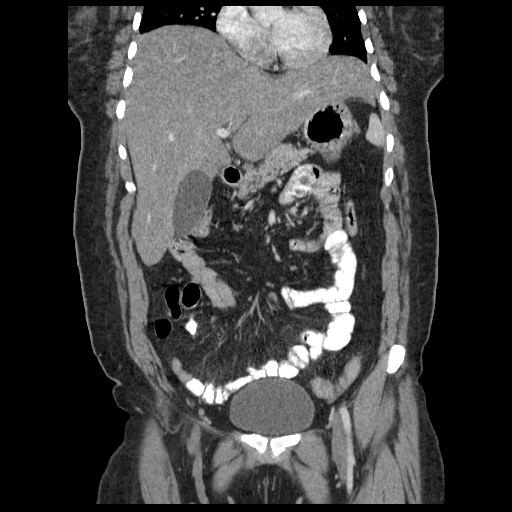

[13 of 36 positions shown; findings below may reference images not displayed]

FINDINGS: There is no pleural or pericardial effusion.  Patient has patchy bibasilar airspace disease likely reflecting atelectasis.  The liver is diffusely low attenuating consistent with fatty change.  No focal liver lesion is identified.  The gallbladder, biliary tree, adrenal glands, kidneys, spleen, and pancreas all appear normal.  No abdominal lymphadenopathy or fluid collection.  Stomach and small bowel are unremarkable in appearance.  No focal bony abnormality.
IMPRESSION: 1.  No acute findings in the abdomen.
 2.  Fatty liver.
 3.  Patchy bibasilar airspace disease, likely atelectasis.
 PELVIS CT WITH CONTRAST:
FINDINGS: The uterus and adnexa are unremarkable.  Colon appears normal with the appendix well visualized and also normal in appearance.   Several small lymph nodes are identified in the right lower quadrant.  No pelvic fluid or lymphadenopathy.  No focal bony abnormality.
IMPRESSION: Small right lower quadrant lymph nodes may be due to mesenteric adenitis.  Appendix is normal.

## 2007-06-24 IMAGING — CR DG ABDOMEN ACUTE W/ 1V CHEST
3 series · 3 of 3 positions shown · non-contrast
Comparison: None.

CLINICAL DATA: Lower abdominal pain.  Nausea and vomiting. 
ACUTE ABDOMINAL SERIES:

[w chest pa]
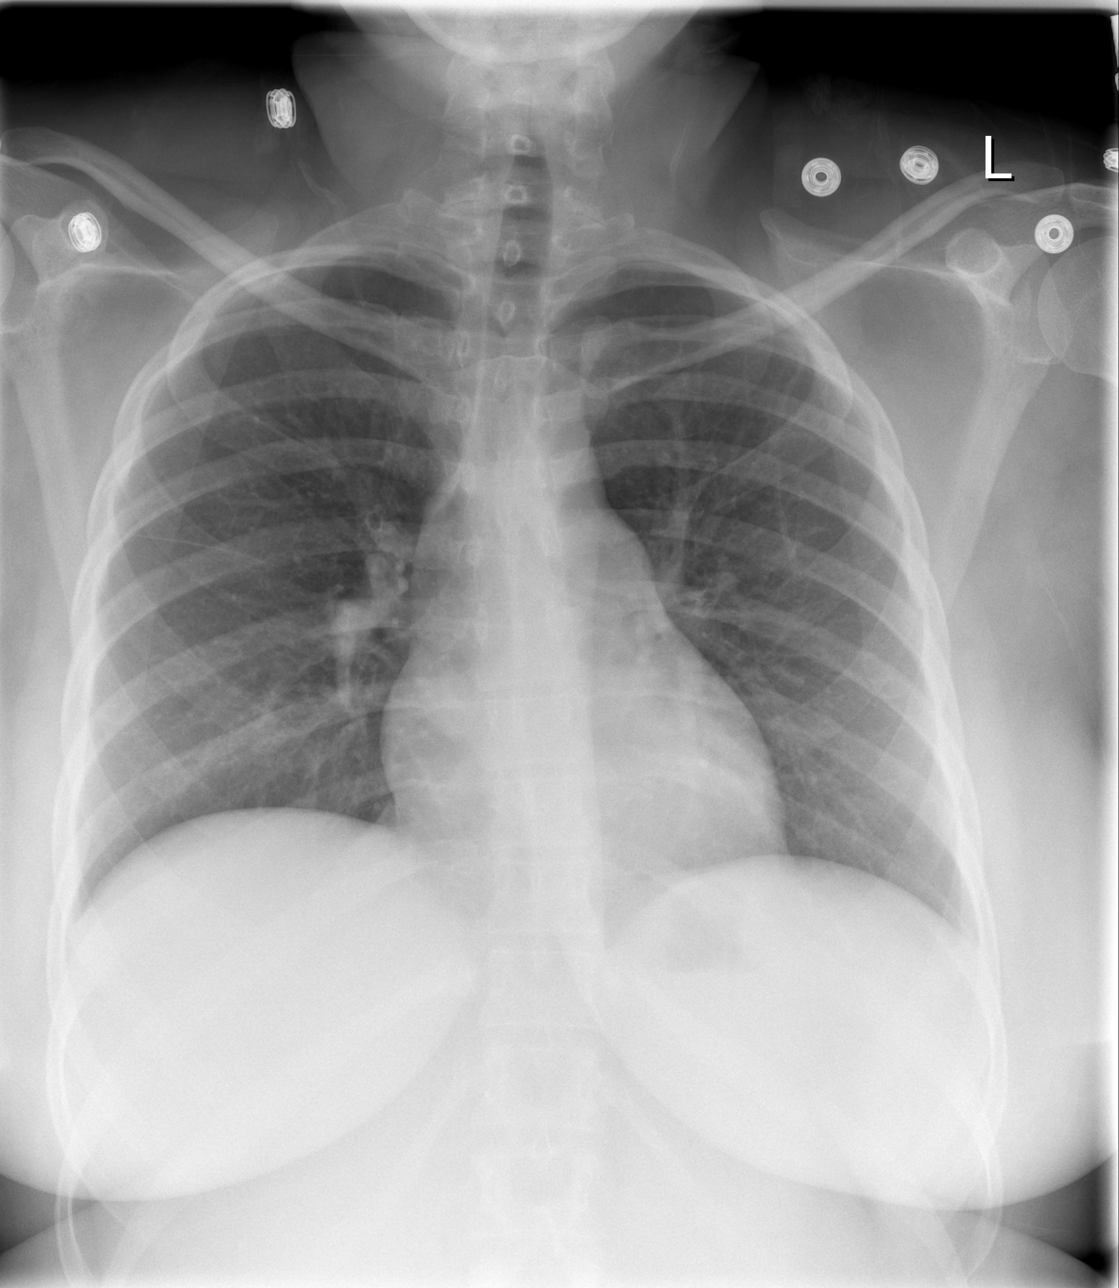

[w abdomen upright]
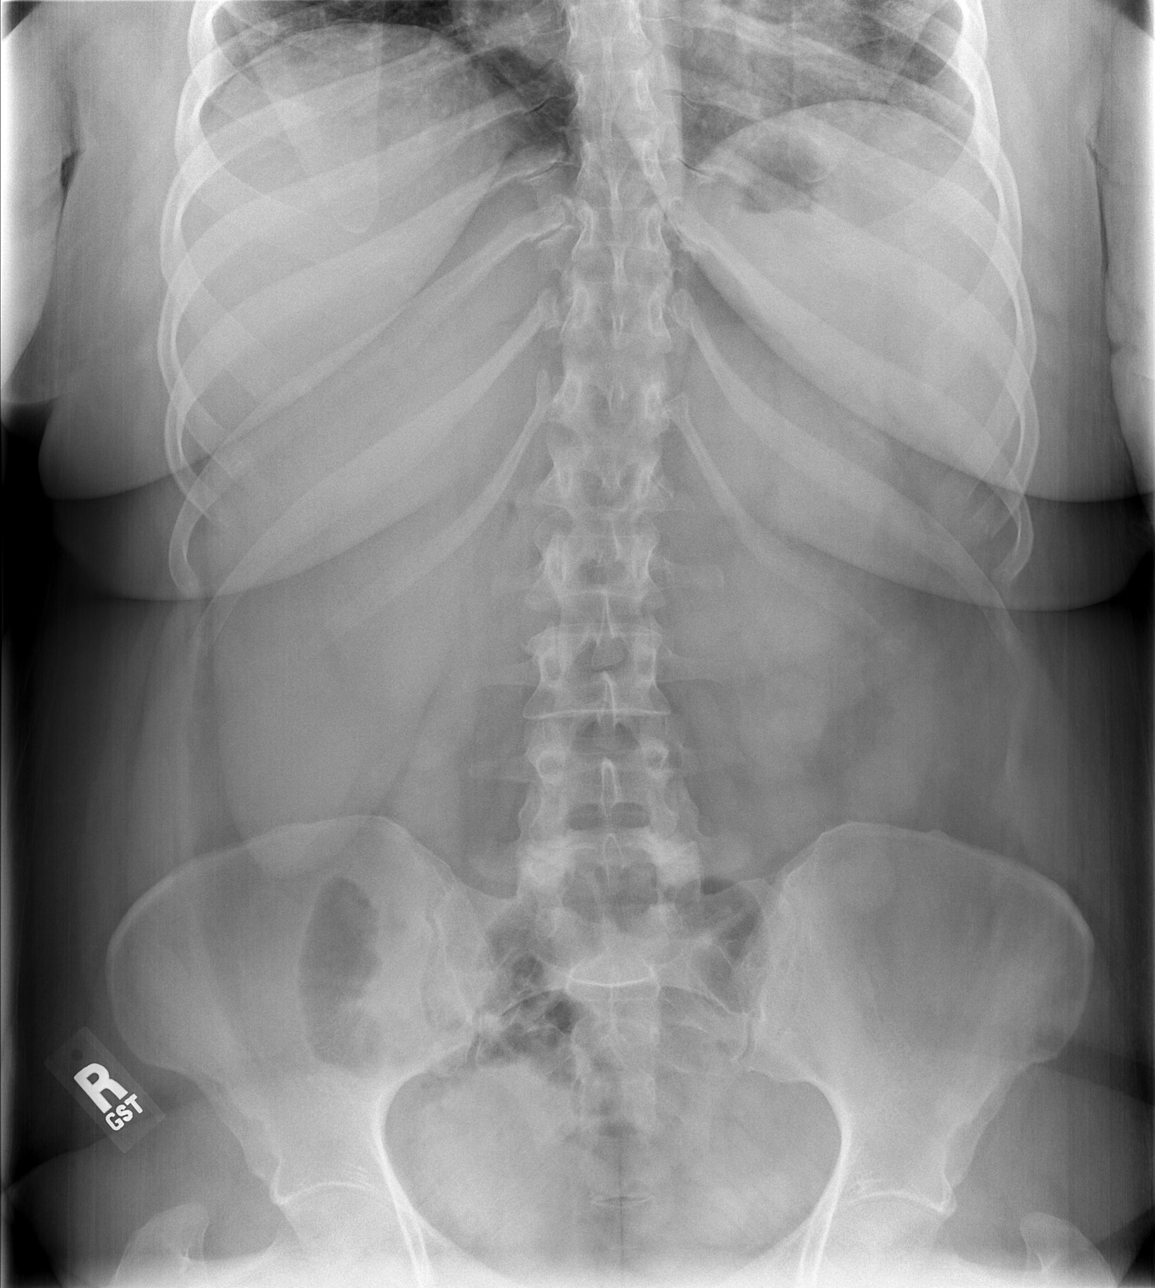

[t abdomen supine]
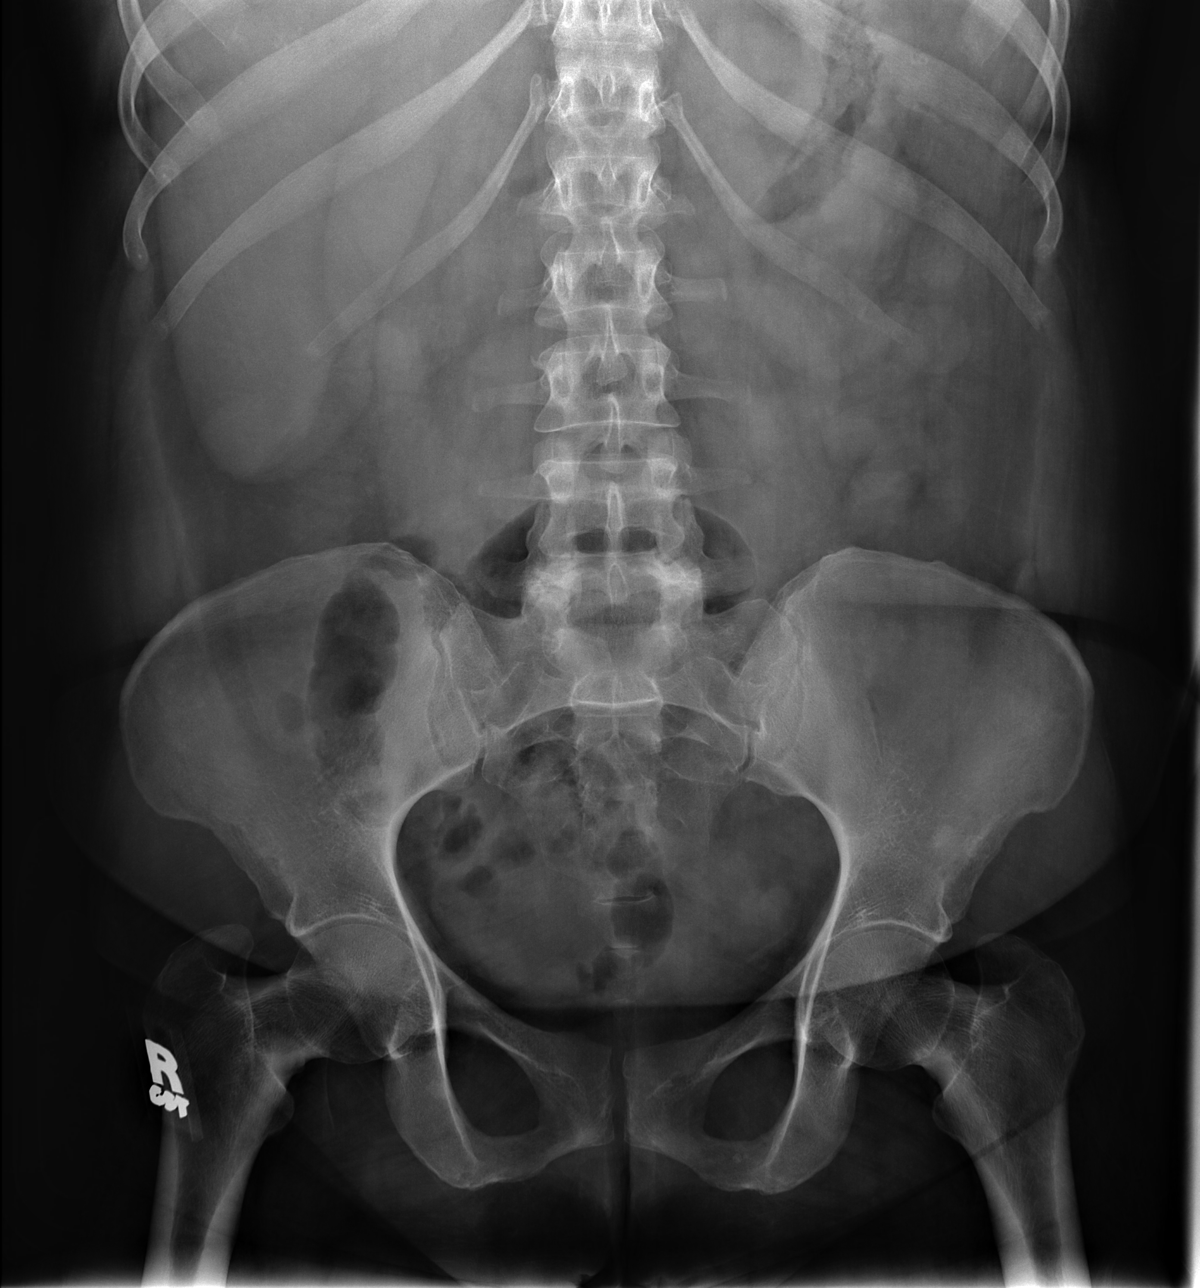

[3 of 3 positions shown; findings below may reference images not displayed]

FINDINGS: Single view of chest demonstrates clear lungs with normal appearing heart and mediastinum.  No pleural effusion. 
Two views of the abdomen demonstrate no free intraperitoneal air and a normal bowel gas pattern.  No unexpected abdominal calcifications.
IMPRESSION: No acute finding chest or abdomen.

## 2007-12-01 ENCOUNTER — Emergency Department (HOSPITAL_COMMUNITY): Admission: EM | Admit: 2007-12-01 | Discharge: 2007-12-01 | Payer: Self-pay | Admitting: Emergency Medicine

## 2010-01-30 ENCOUNTER — Emergency Department (HOSPITAL_COMMUNITY): Admission: EM | Admit: 2010-01-30 | Discharge: 2010-01-30 | Payer: Self-pay | Admitting: Emergency Medicine

## 2010-04-27 ENCOUNTER — Encounter: Admission: RE | Admit: 2010-04-27 | Discharge: 2010-04-27 | Payer: Self-pay | Admitting: Family Medicine

## 2010-08-14 ENCOUNTER — Ambulatory Visit: Payer: Self-pay | Admitting: Surgery

## 2010-08-14 ENCOUNTER — Ambulatory Visit (HOSPITAL_COMMUNITY): Admission: RE | Admit: 2010-08-14 | Discharge: 2010-08-14 | Payer: Self-pay | Admitting: Family Medicine

## 2010-08-14 ENCOUNTER — Encounter: Payer: Self-pay | Admitting: Family Medicine

## 2011-03-09 NOTE — Consult Note (Signed)
   NAME:  Rebecca [name], Rebecca [name]                    ACCOUNT NO.:  [id number]   MEDICAL RECORD NO.:  [id number]                   PATIENT TYPE:  OUT   LOCATION:  [facility]                                  FACILITY:  [facility]   PHYSICIAN:  Phil D. Okey Dupre, M.D.                  DATE OF BIRTH:  07/06/1966   DATE OF CONSULTATION:  DATE OF DISCHARGE:                                   CONSULTATION   HISTORY OF PRESENT ILLNESS:  The patient is a 45 year old nulligravida  Arabic woman who has irregular periods and has been trying to get pregnant  for some time.  Check her chart you will see the [facility] and other levels.  We  gave her b.i.d. Clomid two months ago.  Unfortunately, her understanding of  the temperature chart was not clear and the patient only took her  temperature during her period.  We are going to repeat the Clomid 100 b.i.d.  starting today, the sixth day of her cycle and she is instructed to come in  after 30 days if she does not have a period, to continue the temperature  chart, and if she has a period to come in on or before the fifth day.  She  has also been complaining of some slight abdominal bloating, especially  after eating and frequency of urination.  We are going to get a beta-HCG,  Helicobacter pylori, and a urinalysis.   IMPRESSION:  1. Anovulatory infertility.  2. Urinary frequency.                                               Phil D. Okey Dupre, M.D.    PDR/MEDQ  D:  05/11/2003  T:  05/11/2003  Job:  [id number]

## 2011-03-09 NOTE — Op Note (Signed)
   NAME:  Rebecca [name], Rebecca [name]                    ACCOUNT NO.:  [id number]   MEDICAL RECORD NO.:  [id number]                   PATIENT TYPE:  INP   LOCATION:  9326                                 [facility]   PHYSICIAN:  Conni Elliot, M.D.             DATE OF BIRTH:  [birth year]   DATE OF PROCEDURE:  07/09/2002  DATE OF DISCHARGE:  07/10/2002                                 OPERATIVE REPORT   PREOPERATIVE DIAGNOSIS:  Dysfunctional uterine bleeding.   POSTOPERATIVE DIAGNOSIS:  Dysfunctional uterine bleeding.   OPERATION PERFORMED:  Dilatation and curettage.   SURGEON:  Conni Elliot, M.D.   OPERATIVE FINDINGS:  Exam under anesthesia revealed the uterus to be normal  size, shape, and contour, anterior to mid plane.  Adnexa were clear.  Postural vaginal exam the cervix was open and extruding clot.  The cavity  was sounded to approximately 8 cm and felt to patulous but regular in  contour.   OPERATIVE PROCEDURE:  The patient was in the dorsal lithotomy position and  prepped and draped in sterile fashion.  The bladder was emptied.  A weighted  speculum was placed in the posterior vagina.  Cervix with anterior  tenaculum.  As already noted, the cervix was already dilated and easily  accepted to a 25 dilator without significant dilatation.  A paracervical  block was placed prior to dilatation.  Sharp curettage was utilized and as  stated, the cavity was large but regular in contour and a very small amount  of tissue was obtained and this is sent to pathology.  The cavity felt  gritty throughout.  At the end of the procedure, the patient taken to  recovery room __________.                                                  Conni Elliot, M.D.    ASG/MEDQ  D:  07/09/2002  T:  07/10/2002  Job:  [id number]

## 2011-03-09 NOTE — Group Therapy Note (Signed)
NAME:  Rebecca [name], Rebecca [name]                    ACCOUNT NO.:  [id number]   MEDICAL RECORD NO.:  [id number]                   PATIENT TYPE:  OUT   LOCATION:  [facility]                           FACILITY:  [facility]   PHYSICIAN:  Argentina Donovan, MD                     DATE OF BIRTH:  1966/08/01   DATE OF SERVICE:  02/29/2004                                    CLINIC NOTE   REASON FOR VISIT:  This patient who is an infertility patient with  oligomenorrhea is in for Pap smear.  Unfortunately, we could not do it today  because her period started.  The patient's last menstrual period started  today - Feb 29, 2004.  She has been on Clomid 50 mg t.i.d. during the last  cycle for 5 days.  She did not have a biphasic curve until day #31 but has  been up for 13 days prior to the onset of her period so we may have  accomplished ovulation with this patient.  We are going to continue her on  that for the next 2 months.  If she continues this but has a long portion  before she ovulates in each cycle we will go ahead and give her some  chorionic gonadotrophin.  At this point, it appeared she had a biphasal  curve up to 98 and hopefully we have accomplished what we set out to do with  this patient.  We will plan on a Pap smear next cycle and continue her on  the 50 mg three times a day for 5 days.   IMPRESSION:  Oligomenorrhea, infertility.                                               Argentina Donovan, MD    PR/MEDQ  D:  02/29/2004  T:  02/29/2004  Job:  [id number]

## 2011-03-09 NOTE — Discharge Summary (Signed)
   NAME:  Rebecca [name], Rebecca [name]                    ACCOUNT NO.:  [id number]   MEDICAL RECORD NO.:  [id number]                   PATIENT TYPE:  INP   LOCATION:  9326                                 [facility]ILITY[facility]   PHYSICIAN:  Phil D. Okey Dupre, M.D.                  DATE OF BIRTH:  [birth year]   DATE OF ADMISSION:  07/07/2002  DATE OF DISCHARGE:  07/10/2002                                 DISCHARGE SUMMARY   HISTORY OF PRESENT ILLNESS:  This is a 46 year old nulligravida African  female who was admitted because of dysfunctional bleeding after having gone  four years with amenorrhea.  She had never had regular periods.   HOSPITAL COURSE:  She was treated with Premarin on admission and the  bleeding continued.  On the day prior to discharge, she had a D&C at which  time the cervix appeared somewhat dilated and the uterus boggy.  A sonogram  on her admission was completely normal and the endometrium was thin.  At the  time of discharge, there is no bleeding.  We do not have a diagnosis for her  condition as the hormone levels drawn in the hospital were skewed by the  fact that the patient was on Premarin therapy.  Therefore, I told her to  return to the clinic in one week so that we may evaluate her hormone levels  and check the pathology report.   DISCHARGE DIAGNOSIS:  Dysfunctional uterine bleeding pending confirmatory  diagnosis.   CONDITION ON DISCHARGE:  Satisfactory with mild anemia.                                               Phil D. Okey Dupre, M.D.    PDR/MEDQ  D:  07/10/2002  T:  07/12/2002  Job:  [id number]

## 2011-03-09 NOTE — Group Therapy Note (Signed)
NAME:  Rebecca [name], Rebecca [name]                    ACCOUNT NO.:  [id number]   MEDICAL RECORD NO.:  [id number]                   PATIENT TYPE:  OUT   LOCATION:  [facility]                           FACILITY:  [facility]   PHYSICIAN:  Argentina Donovan, MD                     DATE OF BIRTH:  [birth year]   DATE OF SERVICE:  05/02/2004                                    CLINIC NOTE   REASON FOR VISIT:  The patient is a nulligravida Arab lady who we have been  following for infertility - see previous note - who has had two excellent  biphasic curves on Clomid t.i.d. for 5 days.  We are going to have her  continue on this for 3 more months.  If she is not pregnant and the day of  coitus coincides with a favorable preovulatory time, we will get a sperm  count and then if that is okay, follow up with hysterosalpingogram, etc.   IMPRESSION:  Anovulation, apparently corrected.                                               Argentina Donovan, MD    PR/MEDQ  D:  05/02/2004  T:  05/02/2004  Job:  [id number]

## 2011-03-09 NOTE — Group Therapy Note (Signed)
NAMEMarland [name]  TALIANA, MERSEREAU NO.:  [id number]   MEDICAL RECORD NO.:  [id number]          PATIENT TYPE:  WOC   LOCATION:  [facility]                   FACILITY:  [facility]   PHYSICIAN:  Argentina Donovan, MD        DATE OF BIRTH:  [birth year]   DATE OF SERVICE:                                    CLINIC NOTE   DATE OF VISIT:  August 15, 2004.   CLINIC NOTE:  The patient is a 45 year old, Arabic, nulligravida female, who  has had a problem with anovulation and has now a successful biphasic curve  on three 50 mg Clomid tablets a day for five days for the past two cycles.  Her ovulation apparently occurs around day 20 to 57, and her husband, who  had been a Information systems manager, had difficulty getting home for their  contact to try and get pregnant.  We are going to keep the patient on the  same dose for the next three months in an attempt to see whether or not she  can get pregnant this way.  If not, we may add a shot of chorionic  gonadotropin on day 11 of the cycle following her taking of the Clomid.  The  patient, however, does not complain of any mid-cycle pain, but since her  period this month has had lower abdominal pain, it is difficult to evaluate  because of her habitus, but an ultrasound will be obtained in order to see  whether or not we can make sure she does not have an overstimulation with an  ovarian cyst.  The patient is going to call or come in around fifth day of  the next cycle.  I have taught them how to time that in relationship to  temperature rise, so making an appointment should be easy.      PR/MEDQ  D:  08/15/2004  T:  08/15/2004  Job:  [id number]

## 2011-03-09 NOTE — Group Therapy Note (Signed)
   NAME:  Rebecca [name], SPEIR                    ACCOUNT NO.:  [id number]   MEDICAL RECORD NO.:  [id number]                   PATIENT TYPE:  OUT   LOCATION:  [facility]                           FACILITY:  [facility]   PHYSICIAN:  Argentina Donovan, MD                     DATE OF BIRTH:  Dec 21, 1965   DATE OF SERVICE:  07/15/2003                                    CLINIC NOTE   HISTORY OF PRESENT ILLNESS:  The patient is a 45 year old nulligravida  Arabic woman who was recently treated for Helicobacter pylori, apparently  successfully as the symptoms have abated.  We are going to repeat her test  on that.  She brings in her temperature chart which shows a flat,  nonbiphasic cycle.  I am going to put her on 50 mg daily x5 days for the  next three cycles, Clomid.  She has been instructed if she has no period  within 30 days after her dose to come in as we may have to increase the dose  of Clomid.  She has been anovulatory and has amenorrhea for quite some time,  is anxious to get pregnant.   IMPRESSION:  1. Hypothalamic.  2. Amenorrhea.                                               Argentina Donovan, MD    PR/MEDQ  D:  07/15/2003  T:  07/15/2003  Job:  [id number]

## 2011-08-03 LAB — URINE MICROSCOPIC-ADD ON

## 2011-08-03 LAB — CBC
Hemoglobin: 12.7
MCHC: 34
MCV: 78.8
RBC: 4.75

## 2011-08-03 LAB — URINALYSIS, ROUTINE W REFLEX MICROSCOPIC
Bilirubin Urine: NEGATIVE
Ketones, ur: 15 — AB
Leukocytes, UA: NEGATIVE
Nitrite: NEGATIVE
Urobilinogen, UA: 1
pH: 8.5 — ABNORMAL HIGH

## 2011-08-03 LAB — DIFFERENTIAL
Basophils Absolute: 0
Eosinophils Absolute: 0.2
Lymphocytes Relative: 30
Lymphs Abs: 2.2
Neutrophils Relative %: 59

## 2011-08-03 LAB — COMPREHENSIVE METABOLIC PANEL
ALT: 32
CO2: 29
Calcium: 8.9
Chloride: 100
Creatinine, Ser: 0.55
GFR calc non Af Amer: >60
Glucose, Bld: 156 — ABNORMAL HIGH
Total Bilirubin: 0.5

## 2011-08-03 LAB — POCT PREGNANCY, URINE: Operator id: 26138

## 2011-08-03 LAB — LIPASE, BLOOD: Lipase: 20

## 2011-08-28 ENCOUNTER — Emergency Department (HOSPITAL_COMMUNITY)
Admission: EM | Admit: 2011-08-28 | Discharge: 2011-08-28 | Disposition: A | Discharge status: Home / Self Care | Payer: Self-pay | Attending: Emergency Medicine | Admitting: Emergency Medicine

## 2011-08-28 ENCOUNTER — Encounter: Payer: Self-pay | Admitting: *Deleted

## 2011-08-28 ENCOUNTER — Emergency Department (HOSPITAL_COMMUNITY): Payer: Self-pay

## 2011-08-28 DIAGNOSIS — M25469 Effusion, unspecified knee: Secondary | ICD-10-CM | POA: Insufficient documentation

## 2011-08-28 DIAGNOSIS — Y990 Civilian activity done for income or pay: Secondary | ICD-10-CM | POA: Insufficient documentation

## 2011-08-28 DIAGNOSIS — M25569 Pain in unspecified knee: Secondary | ICD-10-CM | POA: Insufficient documentation

## 2011-08-28 DIAGNOSIS — S838X9A Sprain of other specified parts of unspecified knee, initial encounter: Secondary | ICD-10-CM | POA: Insufficient documentation

## 2011-08-28 DIAGNOSIS — X500XXA Overexertion from strenuous movement or load, initial encounter: Secondary | ICD-10-CM | POA: Insufficient documentation

## 2011-08-28 DIAGNOSIS — S86919A Strain of unspecified muscle(s) and tendon(s) at lower leg level, unspecified leg, initial encounter: Secondary | ICD-10-CM

## 2011-08-28 DIAGNOSIS — E119 Type 2 diabetes mellitus without complications: Secondary | ICD-10-CM | POA: Insufficient documentation

## 2011-08-28 IMAGING — CR DG KNEE COMPLETE 4+V*L*
5 series · 5 of 5 positions shown · non-contrast
Comparison: None.

CLINICAL DATA: Left knee pain.  No known injury.

LEFT KNEE - COMPLETE 4+ VIEW

[t knee ap left]
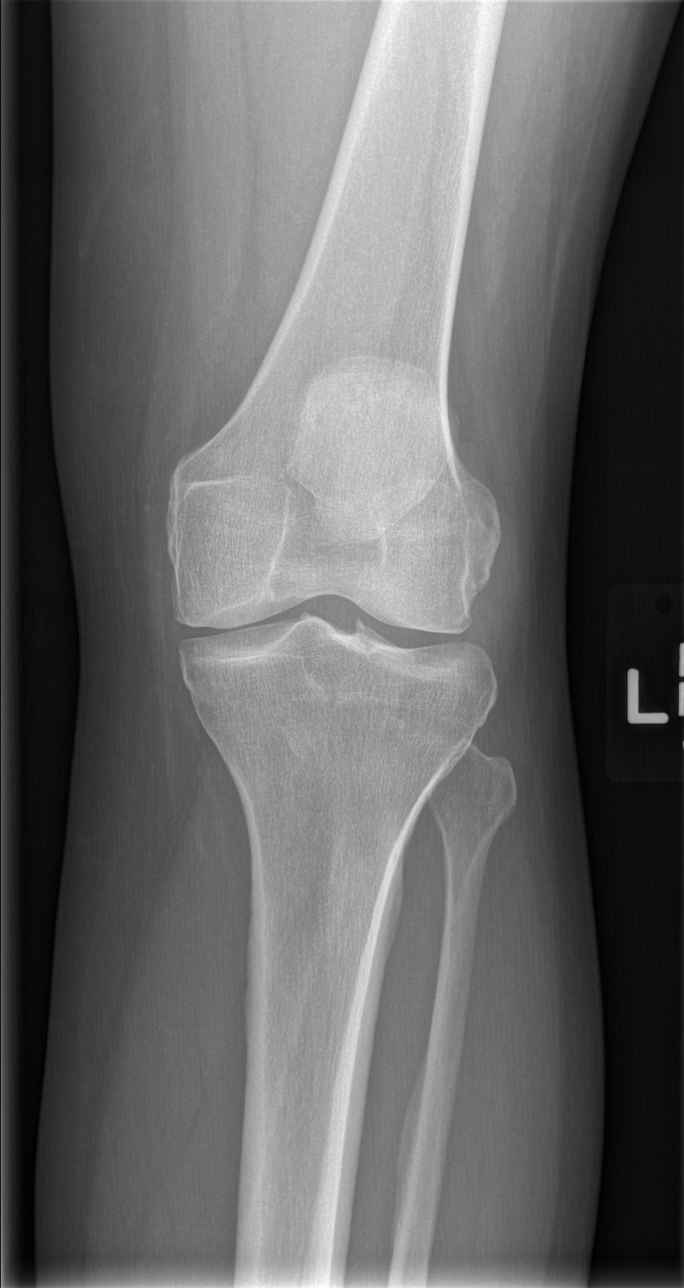

[t knee obl left (1 of 2)]
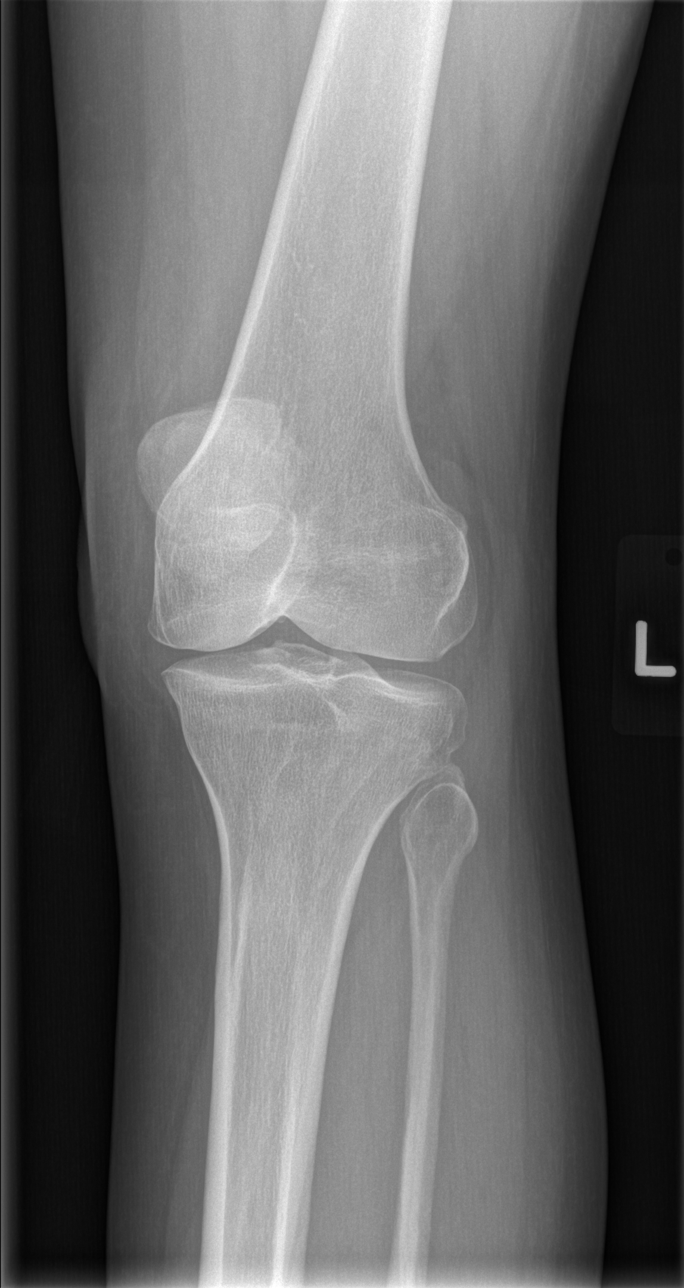

[t knee obl left (2 of 2)]
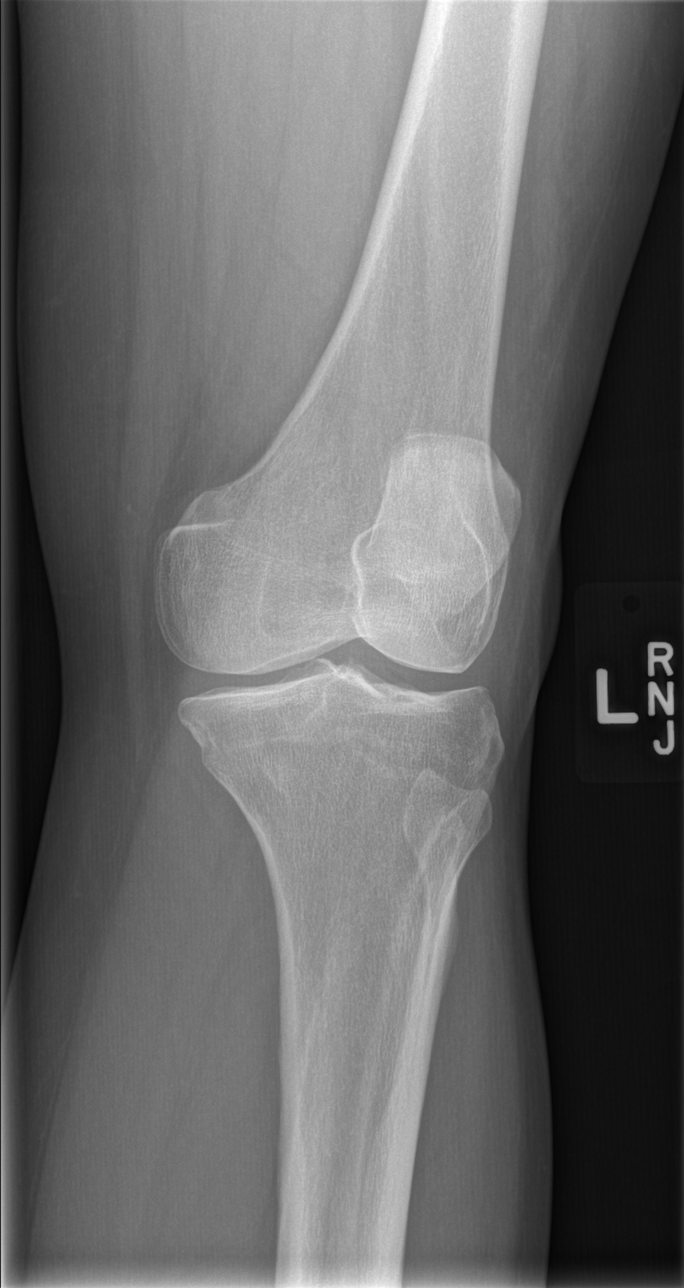

[t knee lat left (1 of 2)]
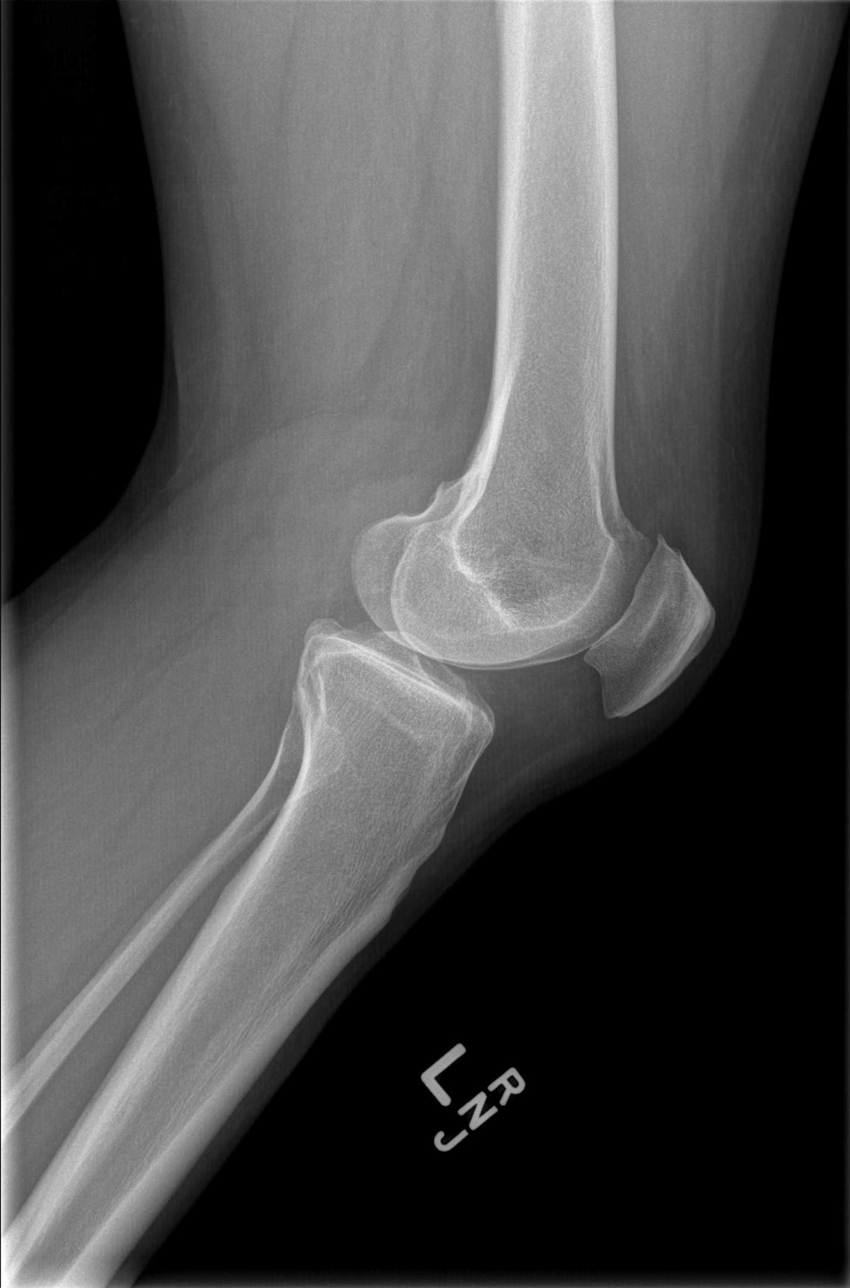

[t knee lat left (2 of 2)]
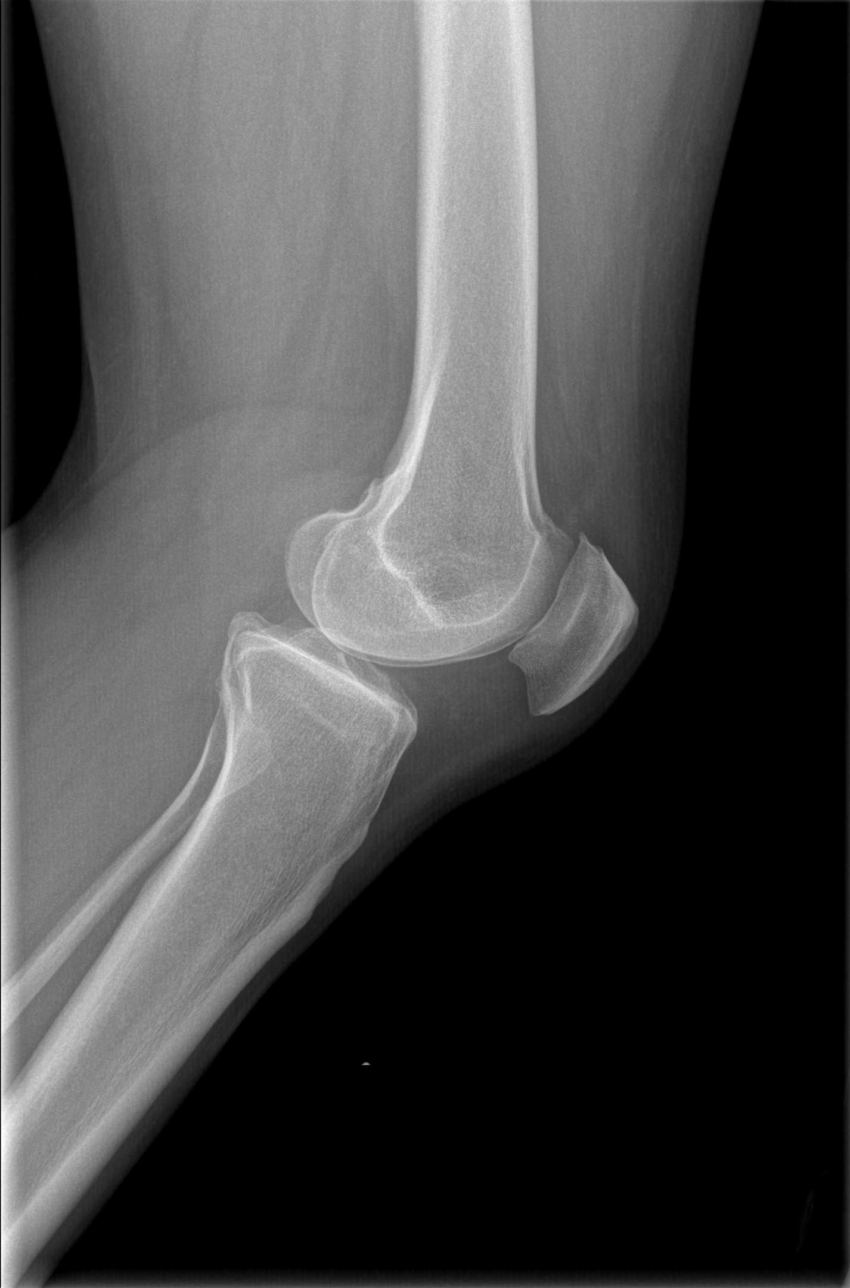

[5 of 5 positions shown; findings below may reference images not displayed]

FINDINGS: No displaced acute fracture or dislocation identified. No
aggressive appearing osseous lesion.  No appreciable joint
effusion.  Mild patellofemoral DJD.
IMPRESSION: Patellofemoral DJD.  No acute osseous abnormality.

## 2011-08-28 MED ORDER — HYDROCODONE-ACETAMINOPHEN 5-325 MG PO TABS: 2.0000 | ORAL_TABLET | ORAL | Status: AC | PRN

## 2011-08-28 MED ORDER — HYDROCODONE-ACETAMINOPHEN 5-325 MG PO TABS
1.0000 | ORAL_TABLET | Freq: Once | ORAL | Status: AC
  Administered 2011-08-28: 1 via ORAL
  Filled 2011-08-28: qty 1

## 2011-08-28 NOTE — ED Provider Notes (Signed)
History     CSN: [id number] Arrival date & time: 08/28/2011  1:03 AM   First MD Initiated Contact with Patient 08/28/11 0421      Chief Complaint  Patient presents with  . Knee Pain    (Consider location/radiation/quality/duration/timing/severity/associated sxs/prior treatment) HPI Comments: Patient states she was at work when she twisted her left knee while stepping over a plank  Patient is a 45 y.o. female presenting with knee pain. The history is provided by the patient and a relative. The history is limited by a language barrier. A language interpreter was used.  Knee Pain This is a new problem. The current episode started today. The problem occurs constantly. The problem has been gradually improving. Associated symptoms include joint swelling. Pertinent negatives include no abdominal pain, anorexia, chest pain, chills, coughing, diaphoresis, fatigue, fever, myalgias, neck pain, rash, sore throat, visual change, vomiting or weakness. The symptoms are aggravated by twisting and walking. She has tried nothing for the symptoms. The treatment provided no relief.    Past Medical History  Diagnosis Date  . Diabetes mellitus     History reviewed. No pertinent past surgical history.  History reviewed. No pertinent family history.  History  Substance Use Topics  . Smoking status: Not on file  . Smokeless tobacco: Not on file  . Alcohol Use: No    OB History    Grav Para Term Preterm Abortions TAB SAB Ect Mult Living                  Review of Systems  Constitutional: Negative.  Negative for fever, chills, diaphoresis and fatigue.  HENT: Negative.  Negative for sore throat and neck pain.   Eyes: Negative.   Respiratory: Negative.  Negative for cough.   Cardiovascular: Negative.  Negative for chest pain.  Gastrointestinal: Negative.  Negative for vomiting, abdominal pain and anorexia.  Genitourinary: Negative.   Musculoskeletal: Positive for joint swelling and gait  problem. Negative for myalgias and back pain.  Skin: Negative for rash.  Neurological: Negative.  Negative for weakness.  Hematological: Negative.   Psychiatric/Behavioral: Negative.     Allergies  Review of patient's allergies indicates no known allergies.  Home Medications   Current Outpatient Rx  Name Route Sig Dispense Refill  . METFORMIN HCL ER (MOD) 500 MG PO TB24 Oral Take 500 mg by mouth daily with breakfast.        BP 161/102  Pulse 90  Temp(Src) 98.2 F (36.8 C) (Oral)  SpO2 95%  Physical Exam  Nursing note and vitals reviewed. Constitutional: She is oriented to person, place, and time. She appears well-developed and well-nourished.  HENT:  Head: Normocephalic and atraumatic.  Eyes: Pupils are equal, round, and reactive to light.  Neck: Normal range of motion.  Cardiovascular: Normal rate, regular rhythm and normal heart sounds.   Pulmonary/Chest: Effort normal and breath sounds normal.  Abdominal: Soft. There is no tenderness.  Musculoskeletal:       Left knee: She exhibits normal range of motion, no swelling, no effusion, no deformity, no erythema, no bony tenderness and normal meniscus. tenderness found. Medial joint line tenderness noted. No patellar tendon tenderness noted.  Neurological: She is alert and oriented to person, place, and time.  Skin: Skin is warm and dry.  Psychiatric: She has a normal mood and affect. Her behavior is normal. Judgment and thought content normal.    ED Course  Procedures (including critical care time)  Labs Reviewed - No data to display Dg  Knee Complete 4 Views Left  08/28/2011  *RADIOLOGY REPORT*  Clinical Data: Left knee pain.  No known injury.  LEFT KNEE - COMPLETE 4+ VIEW  Comparison: None.  Findings: No displaced acute fracture or dislocation identified. No aggressive appearing osseous lesion.  No appreciable joint effusion.  Mild patellofemoral DJD.  IMPRESSION: Patellofemoral DJD.  No acute osseous abnormality.   Original Report Authenticated By: Waneta Martins, M.D.     Knee Strain   MDM  Patient with knee strain - x-ray is negative - able to ambulate in the room - will place in knee sleeve and give short course of pain control        Saanvi Hakala [place], [place] 08/28/11 [phone]

## 2011-08-28 NOTE — ED Notes (Signed)
PA at bedside with patient and patient family

## 2011-08-28 NOTE — ED Notes (Signed)
Per EMS- pt in c/o left knee pain x1 day

## 2011-08-28 NOTE — ED Notes (Signed)
Patient assisted to restroom. Pt wanting to walk after restroom and able to walk with minimal assistance. Pt in no apparent distress with husband at bedside

## 2011-08-30 NOTE — ED Provider Notes (Signed)
Medical screening examination/treatment/procedure(s) were performed by non-physician practitioner and as supervising physician I was immediately available for consultation/collaboration.  Toy Baker, MD 08/30/11 Ernestina [place]

## 2012-04-16 ENCOUNTER — Telehealth: Payer: Self-pay | Admitting: Family Medicine

## 2012-04-16 ENCOUNTER — Encounter: Payer: Self-pay | Admitting: Family Medicine

## 2012-04-16 ENCOUNTER — Ambulatory Visit (INDEPENDENT_AMBULATORY_CARE_PROVIDER_SITE_OTHER): Payer: Self-pay | Admitting: Family Medicine

## 2012-04-16 VITALS — BP 134/87 | HR 89 | Temp 98.1°F | Ht 63.0 in | Wt 157.9 lb

## 2012-04-16 DIAGNOSIS — M25562 Pain in left knee: Secondary | ICD-10-CM

## 2012-04-16 DIAGNOSIS — E119 Type 2 diabetes mellitus without complications: Secondary | ICD-10-CM | POA: Insufficient documentation

## 2012-04-16 DIAGNOSIS — M25561 Pain in right knee: Secondary | ICD-10-CM | POA: Insufficient documentation

## 2012-04-16 DIAGNOSIS — E785 Hyperlipidemia, unspecified: Secondary | ICD-10-CM

## 2012-04-16 DIAGNOSIS — E1100 Type 2 diabetes mellitus with hyperosmolarity without nonketotic hyperglycemic-hyperosmolar coma (NKHHC): Secondary | ICD-10-CM

## 2012-04-16 DIAGNOSIS — R002 Palpitations: Secondary | ICD-10-CM

## 2012-04-16 DIAGNOSIS — M549 Dorsalgia, unspecified: Secondary | ICD-10-CM

## 2012-04-16 DIAGNOSIS — E1101 Type 2 diabetes mellitus with hyperosmolarity with coma: Secondary | ICD-10-CM

## 2012-04-16 DIAGNOSIS — M25569 Pain in unspecified knee: Secondary | ICD-10-CM

## 2012-04-16 LAB — LIPID PANEL
Cholesterol: 210 mg/dL — ABNORMAL HIGH (ref 0–200)
LDL Cholesterol: 89 mg/dL (ref 0–99)
Total CHOL/HDL Ratio: 5 Ratio
VLDL: 79 mg/dL — ABNORMAL HIGH (ref 0–40)

## 2012-04-16 LAB — CBC
HCT: 37.5 % (ref 36.0–46.0)
Hemoglobin: 12.7 g/dL (ref 12.0–15.0)
MCH: 26.7 pg (ref 26.0–34.0)
MCHC: 33.9 g/dL (ref 30.0–36.0)
MCV: 78.9 fL (ref 78.0–100.0)
RBC: 4.75 MIL/uL (ref 3.87–5.11)

## 2012-04-16 LAB — POCT GLYCOSYLATED HEMOGLOBIN (HGB A1C): Hemoglobin A1C: 7.3

## 2012-04-16 LAB — BASIC METABOLIC PANEL
CO2: 24 mEq/L (ref 19–32)
Chloride: 104 mEq/L (ref 96–112)
Creat: 0.46 mg/dL — ABNORMAL LOW (ref 0.50–1.10)
Potassium: 4 mEq/L (ref 3.5–5.3)
Sodium: 139 mEq/L (ref 135–145)

## 2012-04-16 MED ORDER — CYCLOBENZAPRINE HCL 5 MG PO TABS: 5.0000 mg | ORAL_TABLET | Freq: Every evening | ORAL | Status: AC | PRN

## 2012-04-16 MED ORDER — METFORMIN HCL ER (MOD) 500 MG PO TB24: 500.0000 mg | ORAL_TABLET | Freq: Every day | ORAL | Status: DC

## 2012-04-16 MED ORDER — METOPROLOL TARTRATE 25 MG PO TABS: 12.5000 mg | ORAL_TABLET | Freq: Two times a day (BID) | ORAL | Status: DC

## 2012-04-16 NOTE — Assessment & Plan Note (Signed)
Will check lipid profile

## 2012-04-16 NOTE — Progress Notes (Signed)
  Subjective:    Patient ID: Rebecca [name], female    DOB: Mar 18, 1966, 46 y.o.   MRN: [id number]  HPI  Patient comes in to establish care.  Her husband is here with her, she speaks Arabic and limited [place], but they agree to have her husband translate for her. She has multiple complaints.   Knee pain- knees have hurt her for a long time.  She has had no specific injury to them, but she works on her feet all day and they ache after work.  Her calvs also hurt.  She denies any swelling, locking, but does say there is a cracking noise.  She wears knee sleeves on them sometimes.  She says she takes tylenol and ibuprofen for her pain- her husband says she takes too much of the over the counter medications.   Palpitations- the patient complains that sometimes when she exerts herself her heart races and she has to sit down.  She denies chest pain and dyspnea just complains of palpitations.  This has been going on for a year or two.  She has never passed out from this.    Back pain- she complains this has only been going on a few weeks.  She says she wakes up and her whole back is sore.  She denies any falls or injury, denies incontinence, weakness or numbness in LE.    DM- takes metformin, has not been check ing blood sugars lately.  She does not think she has had any high or low blood sugars.   HLD- has been told int he past she had high cholesterol, but is not taking a medication for this.  She says she thinks she eats a healthy diet.   Review of Systems Pertinent items in HPI.     Objective:   Physical Exam BP 134/87  Pulse 89  Temp 98.1 F (36.7 C) (Oral)  Ht 5\' 3"  (1.6 m)  Wt 157 lb 14.4 oz (71.623 kg)  BMI 27.97 kg/m2 General appearance: alert, cooperative and no distress Neck: no adenopathy, no carotid bruit, no JVD, supple, symmetrical, trachea midline and thyroid not enlarged, symmetric, no tenderness/mass/nodules Back: symmetric, no curvature. ROM normal. No CVA tenderness.,  + diffuse tenderness to palpation and muscle spasm Lungs: clear to auscultation bilaterally Heart: regular rate and rhythm, S1, S2 normal, no murmur, click, rub or gallop Abdomen: soft, non-tender; bowel sounds normal; no masses,  no organomegaly Extremities: extremities normal, atraumatic, no cyanosis or edema Knees: Normal appearance, no deformity or effusion  Normal ROM, +crepitus with ROM  +joint line tenderness  Ligaments in tact   McMurray's negative.     Assessment & Plan:

## 2012-04-16 NOTE — Telephone Encounter (Signed)
Called to let Dr Lula Olszewski know what he uses: Lantus Solostar 100units

## 2012-04-16 NOTE — Telephone Encounter (Signed)
This is patient's wife- error.

## 2012-04-16 NOTE — Assessment & Plan Note (Signed)
Will check A1C, BMET to evaluate for control and any kidney disease.

## 2012-04-16 NOTE — Assessment & Plan Note (Signed)
No red flags. Will start flexeril at bedtime to see if this improves muscle spasm.

## 2012-04-16 NOTE — Assessment & Plan Note (Signed)
Suspect SVT- but will check TSH, CBC.  Will start low dose metoprolol to see if this helps her symptoms.

## 2012-04-16 NOTE — Assessment & Plan Note (Signed)
Suspect osteoarthritis- will obtain standing x-rays to evaluate.  Advised to continue knee sleeves during work, ice knees after work, and use tylenol and ibuprofen, reviewed safe doses of both medications.

## 2012-04-16 NOTE — Patient Instructions (Signed)
It was good to meet you.  I have refilled your metformin.  I am starting you on a low dose of a blood pressure and heart rate medication to see if this helps with your fast heart beat.  I am checking your blood work to monitor your diabetes and your cholesterol, as well as look for any thyroid problems that could cause your heart to go fast. I will send you a letter with your lab work.   Please go to the Imaging center to have your knee x-rays done.  Please keep wearing your knee braces, and ice your knees for 20 minutes after work.

## 2012-04-18 ENCOUNTER — Encounter: Payer: Self-pay | Admitting: Family Medicine

## 2012-04-23 ENCOUNTER — Ambulatory Visit
Admission: RE | Admit: 2012-04-23 | Discharge: 2012-04-23 | Disposition: A | Discharge status: Home / Self Care | Payer: No Typology Code available for payment source | Source: Ambulatory Visit | Attending: Family Medicine | Admitting: Family Medicine

## 2012-04-23 DIAGNOSIS — M25561 Pain in right knee: Secondary | ICD-10-CM

## 2012-04-23 IMAGING — CR DG KNEE STANDING AP BILAT
2 series · 2 of 2 positions shown · non-contrast
Comparison: None.

CLINICAL DATA: Bilateral knee pain.

BILATERAL KNEES STANDING - 2 VIEWS TOTAL

[view not recorded (1 of 2)]
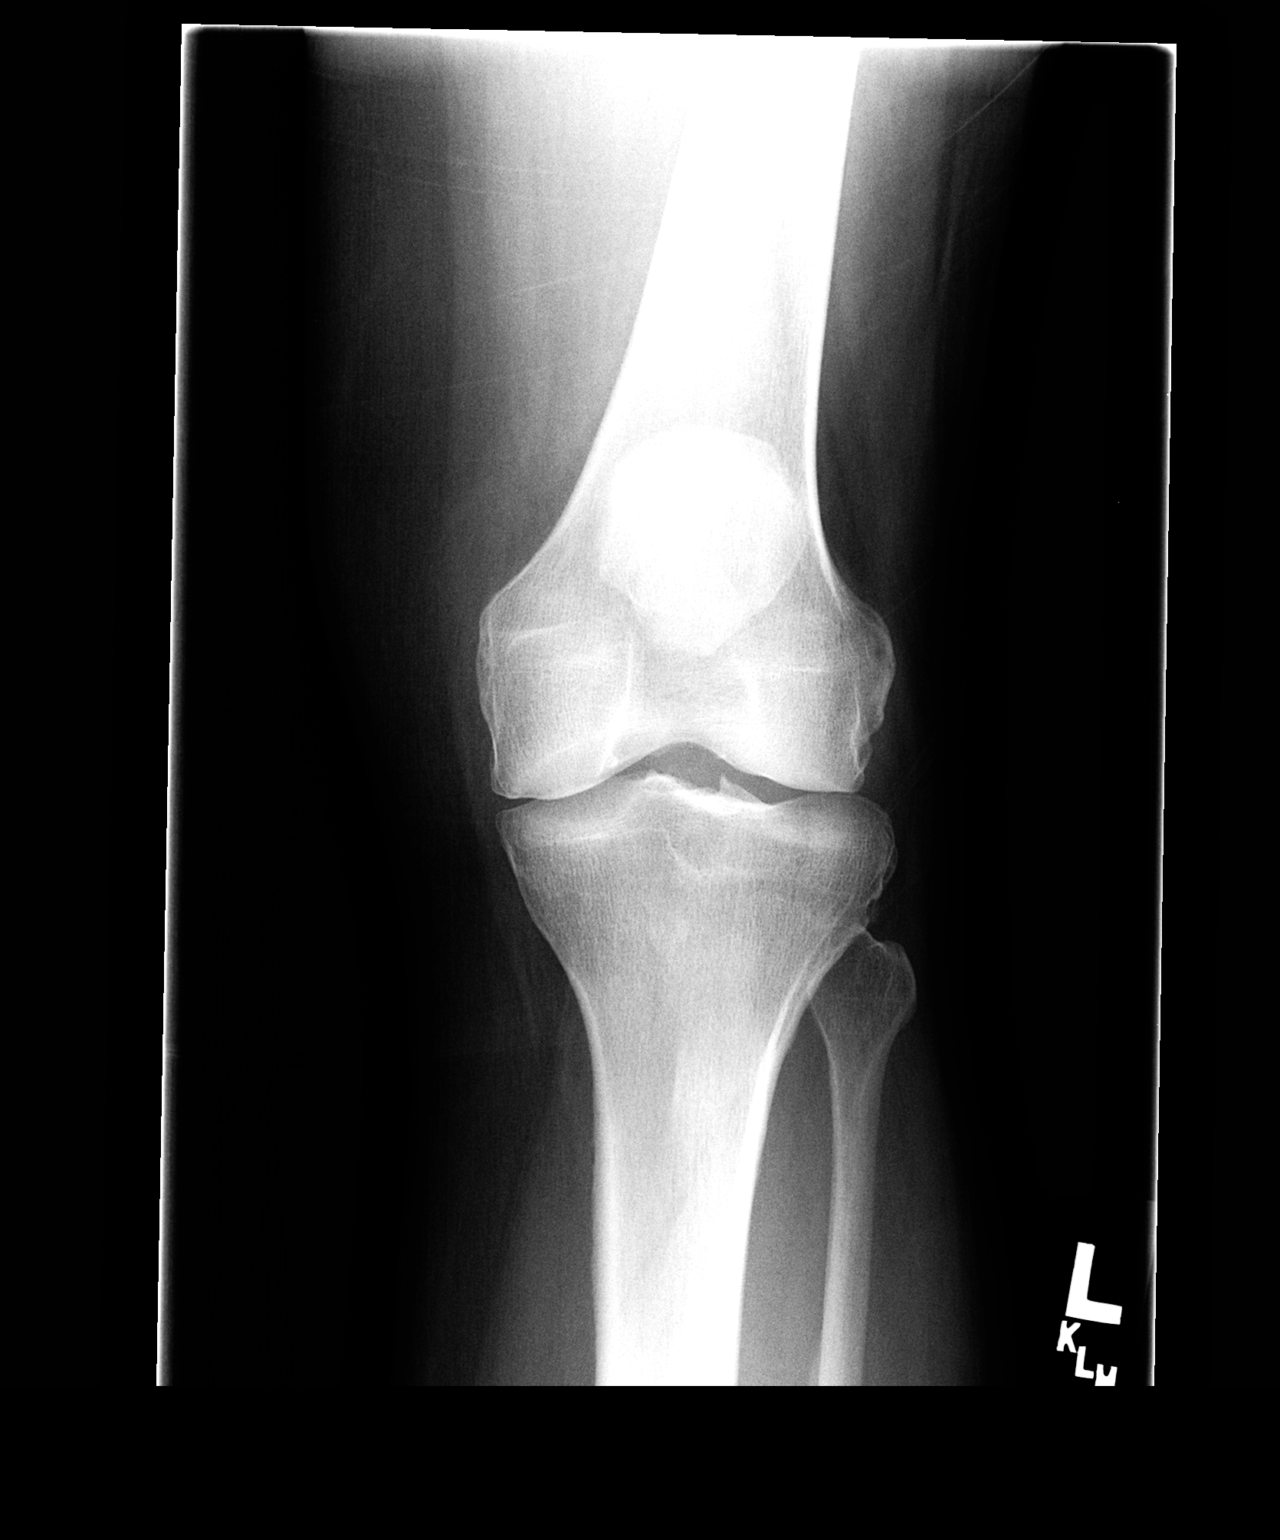

[view not recorded (2 of 2)]
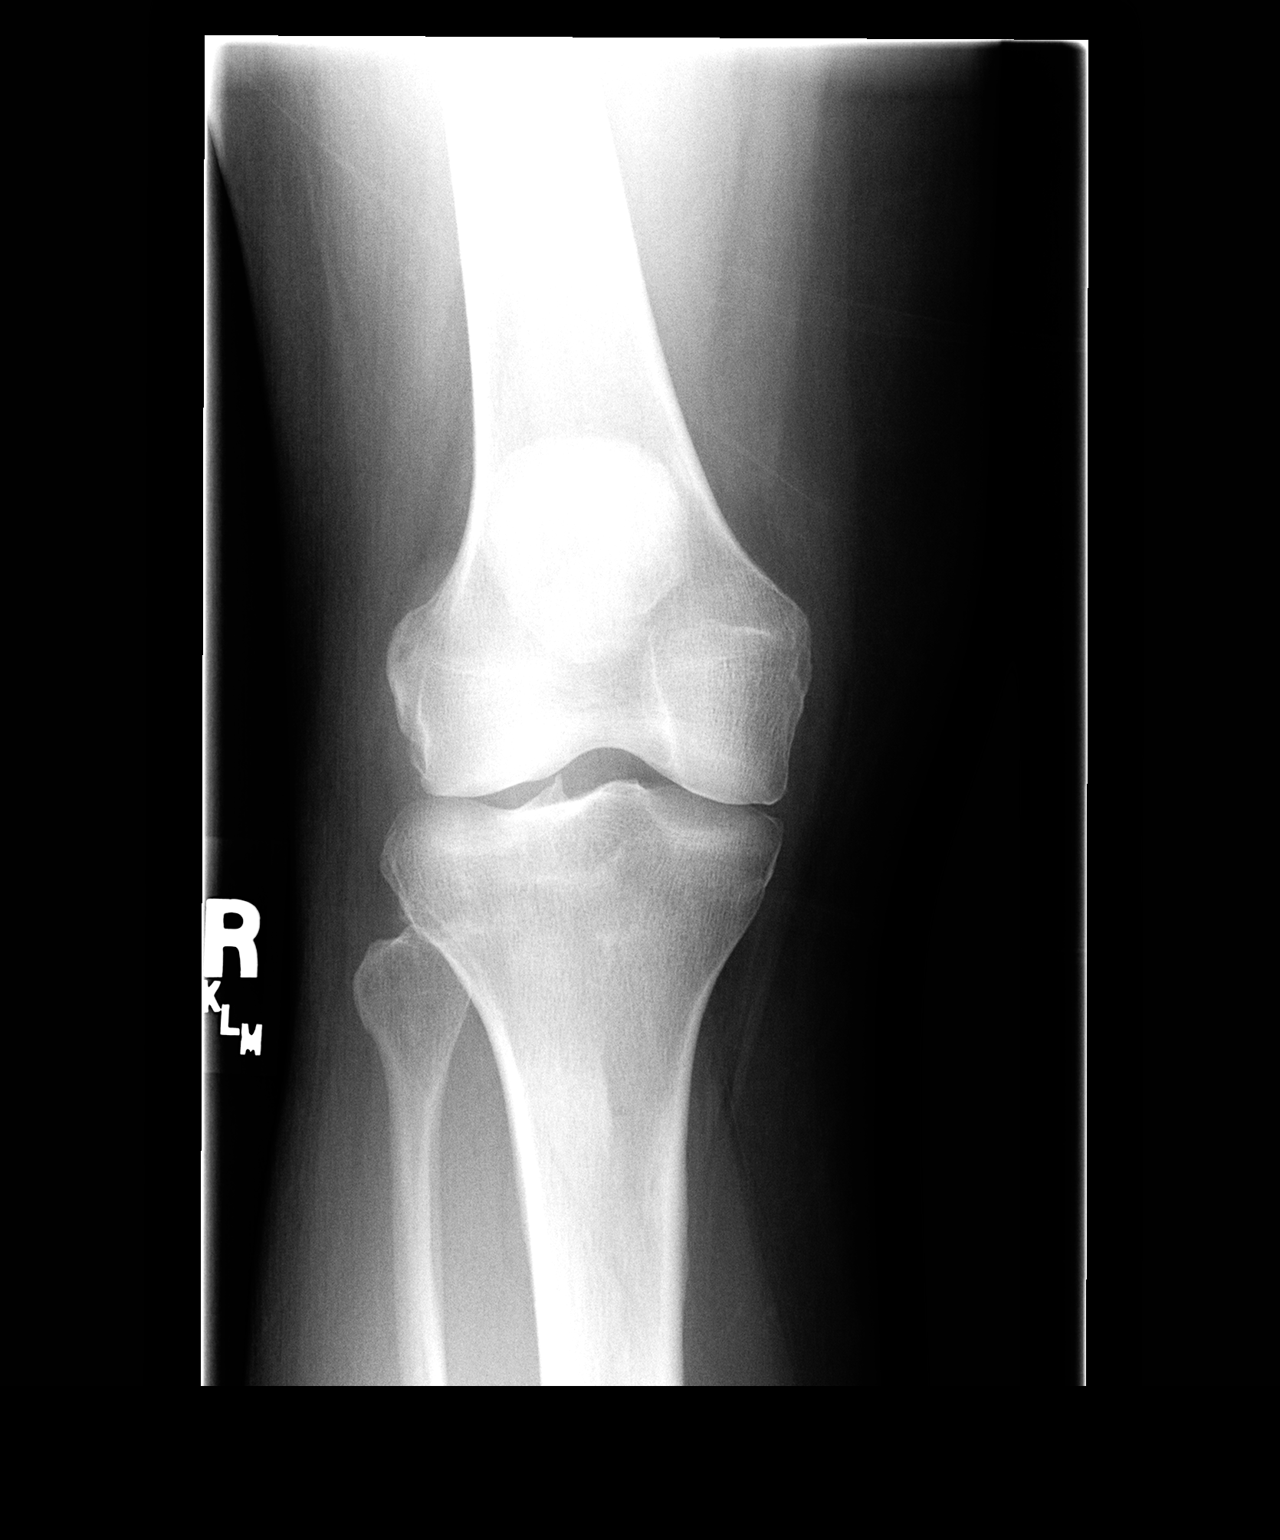

[2 of 2 positions shown; findings below may reference images not displayed]

FINDINGS: A frontal view of the left knee demonstrates spurring of
the tibial spine and suspected mild reduction medial compartmental
articular height.

A frontal projection of the right knee similarly demonstrates mild
spurring of the tibial spine, and mild medial compartmental
narrowing.
IMPRESSION: 1.  Mild findings of osteoarthritis.

## 2012-05-12 ENCOUNTER — Ambulatory Visit (INDEPENDENT_AMBULATORY_CARE_PROVIDER_SITE_OTHER): Payer: No Typology Code available for payment source | Admitting: Family Medicine

## 2012-05-12 ENCOUNTER — Encounter: Payer: Self-pay | Admitting: Family Medicine

## 2012-05-12 VITALS — BP 130/89 | HR 84 | Temp 97.7°F | Ht 63.0 in | Wt 156.0 lb

## 2012-05-12 DIAGNOSIS — M25561 Pain in right knee: Secondary | ICD-10-CM

## 2012-05-12 DIAGNOSIS — E119 Type 2 diabetes mellitus without complications: Secondary | ICD-10-CM

## 2012-05-12 DIAGNOSIS — M25569 Pain in unspecified knee: Secondary | ICD-10-CM

## 2012-05-12 DIAGNOSIS — R002 Palpitations: Secondary | ICD-10-CM

## 2012-05-12 DIAGNOSIS — E785 Hyperlipidemia, unspecified: Secondary | ICD-10-CM

## 2012-05-12 MED ORDER — OMEGA-3 FATTY ACIDS 1000 MG PO CAPS: 2.0000 g | ORAL_CAPSULE | Freq: Every day | ORAL | Status: AC

## 2012-05-12 NOTE — Patient Instructions (Signed)
Please continue to use tylenol and ibuprofen for your knees, you should also wear your knee braces at work and make sure you wear good shoes.   Your triglycerides (part of the cholesterol) are high.  I want you to start taking fish oil to help with this.

## 2012-05-12 NOTE — Assessment & Plan Note (Addendum)
Well controlled- encouraged her to check CBG's. Continue metformin at current dose.

## 2012-05-12 NOTE — Progress Notes (Signed)
  Subjective:    Patient ID: Rebecca [name], female    DOB: 11-Jul-1966, 46 y.o.   MRN: [id number]  HPI  [name] comes in for follow up.    DM- she has not been checking her blood sugars, but thinks they are doing well.  She is taking the metformin once a day without difficulty.    HLD- patient has been told in past she has high cholesterol, is not taking any medications.  Discussed Lipid profile.   Palpitations- she did not take the metoprolol because she was scared to.  She says that she has continued to have some feelings of tiredness and occasional racing heart beat at work.  She says it has improved somewhat though.  She declines any further medications.   Knee pain- XR reviewed, show mild DJD, no other acute abnormalities.  Patient says she has taken occasional tylenol for the pain, but is not taking ibuprofen for the pain. She denies any knee swelling.   Review of Systems See HPI    Objective:   Physical Exam BP 130/89  Pulse 84  Temp 97.7 F (36.5 C)  Ht 5\' 3"  (1.6 m)  Wt 156 lb (70.761 kg)  BMI 27.63 kg/m2  LMP 03/26/2012 General appearance: alert, cooperative and no distress Neck: no adenopathy, supple, symmetrical, trachea midline and thyroid not enlarged, symmetric, no tenderness/mass/nodules Lungs: clear to auscultation bilaterally Heart: regular rate and rhythm, S1, S2 normal, no murmur, click, rub or gallop Abdomen: soft, non-tender; bowel sounds normal; no masses,  no organomegaly Extremities: extremities normal, atraumatic, no cyanosis or edema       Assessment & Plan:

## 2012-05-12 NOTE — Assessment & Plan Note (Signed)
Due to OA.  Advised tylenol scheduled and ibuprofen PRN.  Also advised icing knees after work and wearing knee sleeves.

## 2012-05-12 NOTE — Assessment & Plan Note (Signed)
Unclear etiology- still suspect SVT.  Patient instructed to drink plenty of fluids.  Offered to Rx propranolol for as needed, patient declines.  Reviewed red flags to go to ER.

## 2012-05-12 NOTE — Assessment & Plan Note (Signed)
Rx for fish oil, 2000 mg po qd for triglycerides.  Advised to increase activity as tolerated, and decrease cholesterol in diet.

## 2012-07-14 ENCOUNTER — Other Ambulatory Visit: Payer: Self-pay | Admitting: Family Medicine

## 2012-07-14 MED ORDER — METOPROLOL SUCCINATE ER 25 MG PO TB24: 25.0000 mg | ORAL_TABLET | Freq: Every day | ORAL | Status: DC

## 2012-08-14 ENCOUNTER — Ambulatory Visit: Payer: No Typology Code available for payment source | Admitting: Family Medicine

## 2012-08-19 ENCOUNTER — Ambulatory Visit (INDEPENDENT_AMBULATORY_CARE_PROVIDER_SITE_OTHER): Payer: Self-pay | Admitting: Family Medicine

## 2012-08-19 ENCOUNTER — Encounter: Payer: Self-pay | Admitting: Family Medicine

## 2012-08-19 VITALS — BP 138/90 | HR 81 | Temp 98.0°F | Ht 63.0 in | Wt 157.0 lb

## 2012-08-19 DIAGNOSIS — M25569 Pain in unspecified knee: Secondary | ICD-10-CM

## 2012-08-19 DIAGNOSIS — B373 Candidiasis of vulva and vagina: Secondary | ICD-10-CM | POA: Insufficient documentation

## 2012-08-19 DIAGNOSIS — E119 Type 2 diabetes mellitus without complications: Secondary | ICD-10-CM

## 2012-08-19 DIAGNOSIS — K029 Dental caries, unspecified: Secondary | ICD-10-CM | POA: Insufficient documentation

## 2012-08-19 DIAGNOSIS — B3731 Acute candidiasis of vulva and vagina: Secondary | ICD-10-CM | POA: Insufficient documentation

## 2012-08-19 DIAGNOSIS — M25561 Pain in right knee: Secondary | ICD-10-CM

## 2012-08-19 DIAGNOSIS — N760 Acute vaginitis: Secondary | ICD-10-CM

## 2012-08-19 LAB — POCT WET PREP (WET MOUNT): Clue Cells Wet Prep Whiff POC: NEGATIVE

## 2012-08-19 MED ORDER — PENICILLIN V POTASSIUM 500 MG PO TABS: 500.0000 mg | ORAL_TABLET | Freq: Three times a day (TID) | ORAL | Status: DC

## 2012-08-19 MED ORDER — TRAMADOL HCL 50 MG PO TABS: 50.0000 mg | ORAL_TABLET | Freq: Three times a day (TID) | ORAL | Status: DC | PRN

## 2012-08-19 NOTE — Assessment & Plan Note (Signed)
Rx penicillin x 1 week, will refer to dentist if able.

## 2012-08-19 NOTE — Assessment & Plan Note (Signed)
Well controlled, no medication changes.

## 2012-08-19 NOTE — Progress Notes (Signed)
  Subjective:    Patient ID: Rebecca [name], female    DOB: 09-09-1966, 46 y.o.   MRN: [id number]  HPI  [name] comes in for follow up.    DM: Patient is taking metfomim XL, 500 mg daily.  Patient is not checking blood sugars.  No hyper or hypoglycemic episodes, no polyuria or polydypisa.   Knee pain, bilateral, but R>L.  No acute injury.  Hurts after standing all day at work.  She says they have been making her go up stairs to use the time clock upstairs instead of using the one downstairs.  The stairs really hurt hurt her knee, she is wondering if I can write a note.  She has poor understanding of her knee pain (from OA), and says she wants some thing to make it go away permanently.  She is afraid to take ibuprofen as she has been told this can hurt her kidneys.  She is not taking any medications for her knee pain.    Tooth pain- has had a few cracked teeth for more than a year.  The one on the left upper molar is hurting badly lately, and she says there is some drainage with foul odor.  No fevers or chills.   Vaginal discharge- present for several weeks, has some lower abdominal pain and itching, but no dysuria, fevers, back pain. Is married, no new sexual contacts.   Past Medical History  Diagnosis Date  . Diabetes mellitus   . Hyperlipidemia   . Arthritis    Family History  Problem Relation Age of Onset  . Diabetes Mother   . Heart disease Mother   . Diabetes Father   . Cancer Father 82    Throat  . Diabetes Sister   . Diabetes Brother    History  Substance Use Topics  . Smoking status: Passive Smoke Exposure - Never Smoker  . Smokeless tobacco: Not on file  . Alcohol Use: No    Review of Systems See HPI    Objective:   Physical Exam BP 138/90  Pulse 81  Temp 98 F (36.7 C) (Oral)  Ht 5\' 3"  (1.6 m)  Wt 157 lb (71.215 kg)  BMI 27.81 kg/m2  LMP 08/12/2012 General appearance: alert, cooperative and no distress Mouth: poor dentition, mild drainage from left  upper 2nd to last molar, but no abscess seen.  Abdomen: soft, non-tender; bowel sounds normal; no masses,  no organomegaly Pelvic: cervix normal in appearance, external genitalia normal, no adnexal masses or tenderness, no cervical motion tenderness, rectovaginal septum normal, uterus normal size, shape, and consistency and vagina normal without discharge Knees: Normal to inspection with no erythema or effusion or obvious bony abnormalities. +medial joint line tenderness bilaterally, right knee with lateral joint line tenderness.  ROM normal in flexion and extension and lower leg rotation, +crepitus.  Ligaments with solid consistent endpoints including ACL, PCL, LCL, MCL.      Assessment & Plan:

## 2012-08-19 NOTE — Patient Instructions (Signed)
It was good to see you, I am sorry your knees are still hurting you.  Please try taking two extra strength (1000 mg total) tylenol (acetominophen) three times a day.  You can also take two ibuprofen when you have moderate pain, and I am prescribing a medication called tramadol for severe pain.    Please take the penicillin for one week to help with the tooth.  I will refer you to the dentist.   I will call you to let you know if you need a medication for the vaginal discharge.

## 2012-08-19 NOTE — Assessment & Plan Note (Signed)
Spent 10 minutes discussing osteoarthritis as irreversible except by total knee replacement, which would be very difficult to get considering she has no insurance.  Advised that there is no magic to remove the knee pain, we must manage the pain with medications.  Gave hand out on OA. Advised scheduling tylenol, ibuprofen as needed, tramadol for severe pain.

## 2012-08-19 NOTE — Assessment & Plan Note (Signed)
Wet prep done today  

## 2012-08-20 ENCOUNTER — Telehealth: Payer: Self-pay | Admitting: Family Medicine

## 2012-08-20 MED ORDER — FLUCONAZOLE 150 MG PO TABS: 150.0000 mg | ORAL_TABLET | Freq: Once | ORAL | Status: DC

## 2012-08-20 NOTE — Telephone Encounter (Signed)
Called patient, left message that wet prep showed yeast infection, Rx [facility] diflucan [facility].

## 2012-12-12 ENCOUNTER — Encounter: Payer: Self-pay | Admitting: Family Medicine

## 2012-12-12 ENCOUNTER — Ambulatory Visit (INDEPENDENT_AMBULATORY_CARE_PROVIDER_SITE_OTHER): Payer: No Typology Code available for payment source | Admitting: Family Medicine

## 2012-12-12 VITALS — BP 145/89 | HR 67 | Temp 98.2°F | Ht 63.0 in | Wt 160.0 lb

## 2012-12-12 DIAGNOSIS — N898 Other specified noninflammatory disorders of vagina: Secondary | ICD-10-CM

## 2012-12-12 DIAGNOSIS — E119 Type 2 diabetes mellitus without complications: Secondary | ICD-10-CM

## 2012-12-12 DIAGNOSIS — B3731 Acute candidiasis of vulva and vagina: Secondary | ICD-10-CM

## 2012-12-12 DIAGNOSIS — R002 Palpitations: Secondary | ICD-10-CM

## 2012-12-12 LAB — POCT WET PREP (WET MOUNT): Clue Cells Wet Prep Whiff POC: NEGATIVE

## 2012-12-12 MED ORDER — NYSTATIN 100000 UNIT/GM EX OINT: TOPICAL_OINTMENT | Freq: Two times a day (BID) | CUTANEOUS | Status: DC

## 2012-12-12 MED ORDER — FLUCONAZOLE 150 MG PO TABS: 150.0000 mg | ORAL_TABLET | Freq: Once | ORAL | Status: DC

## 2012-12-12 NOTE — Assessment & Plan Note (Signed)
With surround skin involvement.  Rx for diflucan and nystatin ointment.  Advised that if problem is recurrent should take diflucan preventatively.

## 2012-12-12 NOTE — Patient Instructions (Signed)
It was good to see you.  I want you to come back and see me in about one month so I can see how you are doing.  Please take the diflucan, but also use the nystatin ointment on the itchy areas on the outside. Be sure not to douche or wash on the inside of your vagina vigorously.   It was good to see you today.  Your Hemoglobin A1C is  Lab Results  Component Value Date   HGBA1C 7.6 12/12/2012  .  Remember your goal for A1C is less than 7.  Your goal for fasting morning blood sugar is 80-120.  Good job, please continue your current medication.   Your blood pressure today was BP: 145/89 mmHg.  Remember your goal blood pressure is about 140/80.  Please be sure to take your medication every day.  We will continue to monitor it.

## 2012-12-12 NOTE — Assessment & Plan Note (Signed)
Well controlled, continue metformin.  

## 2012-12-12 NOTE — Assessment & Plan Note (Signed)
Denies recurrent complaints, continue at current dosage.

## 2012-12-12 NOTE — Progress Notes (Signed)
  Subjective:    Patient ID: Rebecca [name], female    DOB: June 24, 1966, 47 y.o.   MRN: [id number]  HPI:  Patient comes in with complaints about recurrent yeast infections.  Getting a history is very difficult due to language barrier, but she declines phone language line.  She says that her GYN would give her refills on the diflucan so she could take it when she needed it.  She is unable to tell me how often (how many times a year, how many months she goes without a yeast infection, etc) she gets yeast infections.  I cannot reliably determine if she douches or not based on the language barrier either. She complains that "her skin is falling off" around her vagina and she complains of itching.   DM: Patient is taking metformin.  Patient is not checking blood sugars.  No hyper or hypoglycemic episodes, no polyuria or polydypisa  Palpitations Taking metoprolol without difficulty.  Palpitations have improved with this medicaiton.  Denies chest pain, dizziness, LE edema.  Patient does not check blood pressures.   She also complains of anxiety and foot pain, which I have asked her to come back to see me about in a few weeks due to time constraints.   Past Medical History  Diagnosis Date  . Diabetes mellitus   . Hyperlipidemia   . Arthritis     History  Substance Use Topics  . Smoking status: Passive Smoke Exposure - Never Smoker  . Smokeless tobacco: Not on file  . Alcohol Use: No    Family History  Problem Relation Age of Onset  . Diabetes Mother   . Heart disease Mother   . Diabetes Father   . Cancer Father 26    Throat  . Diabetes Sister   . Diabetes Brother    ROS Pertinent items in HPI    Objective:  Physical Exam:  BP 145/89  Pulse 67  Temp(Src) 98.2 F (36.8 C) (Oral)  Ht 5\' 3"  (1.6 m)  Wt 160 lb (72.576 kg)  BMI 28.35 kg/m2 General appearance: alert, cooperative and no distress Head: Normocephalic, without obvious abnormality, atraumatic Lungs: clear to  auscultation bilaterally Heart: regular rate and rhythm, S1, S2 normal, no murmur, click, rub or gallop Pulses: 2+ and symmetric GU: Female circumcision, pt has visible thick white discharge.  The skin around the vagina and anus are scaly and white.        Assessment & Plan:

## 2013-01-14 ENCOUNTER — Other Ambulatory Visit: Payer: Self-pay | Admitting: Family Medicine

## 2013-01-14 MED ORDER — METOPROLOL SUCCINATE ER 25 MG PO TB24: 25.0000 mg | ORAL_TABLET | Freq: Every day | ORAL | Status: DC

## 2013-05-15 ENCOUNTER — Telehealth: Payer: Self-pay | Admitting: *Deleted

## 2013-05-15 DIAGNOSIS — E1100 Type 2 diabetes mellitus with hyperosmolarity without nonketotic hyperglycemic-hyperosmolar coma (NKHHC): Secondary | ICD-10-CM

## 2013-05-15 MED ORDER — METFORMIN HCL ER (MOD) 500 MG PO TB24: 500.0000 mg | ORAL_TABLET | Freq: Every day | ORAL | Status: DC

## 2013-05-15 NOTE — Telephone Encounter (Signed)
Done.  Jamyla Ard L, CMA  

## 2013-05-15 NOTE — Telephone Encounter (Signed)
Refilled metformin. [facility]. Thanks.

## 2013-05-15 NOTE — Telephone Encounter (Signed)
Pharmacy calling requesting refill on Metformin 500 mg one pill once a day disp #30 with one refill called to [facility].  Pt will call and schedule appt to meet new MD.  Radene Ou, CMA '

## 2013-06-16 ENCOUNTER — Other Ambulatory Visit: Payer: Self-pay | Admitting: *Deleted

## 2013-06-16 DIAGNOSIS — E1100 Type 2 diabetes mellitus with hyperosmolarity without nonketotic hyperglycemic-hyperosmolar coma (NKHHC): Secondary | ICD-10-CM

## 2013-06-16 MED ORDER — METFORMIN HCL ER (MOD) 500 MG PO TB24: 500.0000 mg | ORAL_TABLET | Freq: Every day | ORAL | Status: DC

## 2013-06-16 NOTE — Telephone Encounter (Signed)
Will refill. Patient needs appointment and A1c prior to any additional refills. Thanks.

## 2013-06-17 ENCOUNTER — Ambulatory Visit: Payer: Self-pay

## 2013-06-17 NOTE — Telephone Encounter (Signed)
Pt notified of refill and pharmacy called w/refill

## 2013-08-06 ENCOUNTER — Encounter: Payer: Self-pay | Admitting: Family Medicine

## 2013-08-06 ENCOUNTER — Ambulatory Visit (INDEPENDENT_AMBULATORY_CARE_PROVIDER_SITE_OTHER): Payer: Self-pay | Admitting: Family Medicine

## 2013-08-06 VITALS — BP 135/88 | HR 85 | Temp 97.5°F | Wt 156.0 lb

## 2013-08-06 DIAGNOSIS — K029 Dental caries, unspecified: Secondary | ICD-10-CM

## 2013-08-06 DIAGNOSIS — R5381 Other malaise: Secondary | ICD-10-CM

## 2013-08-06 DIAGNOSIS — M791 Myalgia, unspecified site: Secondary | ICD-10-CM | POA: Insufficient documentation

## 2013-08-06 DIAGNOSIS — E1100 Type 2 diabetes mellitus with hyperosmolarity without nonketotic hyperglycemic-hyperosmolar coma (NKHHC): Secondary | ICD-10-CM

## 2013-08-06 DIAGNOSIS — E119 Type 2 diabetes mellitus without complications: Secondary | ICD-10-CM

## 2013-08-06 DIAGNOSIS — E785 Hyperlipidemia, unspecified: Secondary | ICD-10-CM

## 2013-08-06 DIAGNOSIS — B373 Candidiasis of vulva and vagina: Secondary | ICD-10-CM

## 2013-08-06 DIAGNOSIS — E1101 Type 2 diabetes mellitus with hyperosmolarity with coma: Secondary | ICD-10-CM

## 2013-08-06 DIAGNOSIS — R5383 Other fatigue: Secondary | ICD-10-CM

## 2013-08-06 DIAGNOSIS — IMO0001 Reserved for inherently not codable concepts without codable children: Secondary | ICD-10-CM

## 2013-08-06 LAB — LIPID PANEL
Cholesterol: 258 mg/dL — ABNORMAL HIGH (ref 0–200)
Total CHOL/HDL Ratio: 6.6 Ratio
Triglycerides: 668 mg/dL — ABNORMAL HIGH (ref <150)

## 2013-08-06 LAB — POCT GLYCOSYLATED HEMOGLOBIN (HGB A1C): Hemoglobin A1C: 7.3

## 2013-08-06 LAB — BASIC METABOLIC PANEL
BUN: 10 mg/dL (ref 6–23)
Calcium: 9 mg/dL (ref 8.4–10.5)
Glucose, Bld: 221 mg/dL — ABNORMAL HIGH (ref 70–99)
Sodium: 135 mEq/L (ref 135–145)

## 2013-08-06 LAB — CBC
HCT: 37.8 % (ref 36.0–46.0)
Hemoglobin: 12.8 g/dL (ref 12.0–15.0)
MCH: 26.8 pg (ref 26.0–34.0)
MCHC: 33.9 g/dL (ref 30.0–36.0)

## 2013-08-06 MED ORDER — METFORMIN HCL ER (MOD) 500 MG PO TB24: 500.0000 mg | ORAL_TABLET | Freq: Every day | ORAL | Status: DC

## 2013-08-06 MED ORDER — TRAMADOL HCL 50 MG PO TABS: 50.0000 mg | ORAL_TABLET | Freq: Three times a day (TID) | ORAL | Status: DC | PRN

## 2013-08-06 MED ORDER — FLUCONAZOLE 150 MG PO TABS: 150.0000 mg | ORAL_TABLET | Freq: Once | ORAL | Status: DC

## 2013-08-06 NOTE — Assessment & Plan Note (Signed)
Has recurrent infections with DM. Will give refill on diflucan.

## 2013-08-06 NOTE — Progress Notes (Signed)
Patient ID: Rebecca [name], female   DOB: 18-Jan-1966, 47 y.o.   MRN: [id number] Rebecca [name] is a 47 y.o. female who presents today for f/u.  DM: patient is taking metformin. Tolerating well. Denies hypoglycemia events. Notes recurrent yeast infections that clear up with diflucan. Has been on this chronically.  Leg pain: states in bilateral calves has pain that states is like muscle balling up and getting hard. Worse in right calf. Occurs when walking or standing for too long. Has been an issue for 5 years. States is not on statin. States has had veins checked and was told they were ok.  Teeth: notes several broken teeth. Has not seen a dentist is some time. Does not have insurance or the orange card at this time.  States gets tired after walking a long distance and then walking up stairs. 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 Feels like her heart is beating fast. Denies chest pain and shortness of breath with this. Denies chest pain and shortness of breath at rest.    Past Medical History  Diagnosis Date  . Diabetes mellitus   . Hyperlipidemia   . Arthritis     History  Smoking status  . Passive Smoke Exposure - Never Smoker  Smokeless tobacco  . Not on file    Family History  Problem Relation Age of Onset  . Diabetes Mother   . Heart disease Mother   . Diabetes Father   . Cancer Father 60    Throat  . Diabetes Sister   . Diabetes Brother     Current Outpatient Prescriptions on File Prior to Visit  Medication Sig Dispense Refill  . nystatin ointment (MYCOSTATIN) Apply topically 2 (two) times daily.  30 g  0   No current facility-administered medications on file prior to visit.    ROS: Per HPI   Physical Exam Filed Vitals:   08/06/13 0835  BP: 135/88  Pulse: 85  Temp: 97.5 F (36.4 C)   Physical Examination: General appearance  - alert, well appearing, and in no distress Chest - clear to auscultation, no wheezes, rales or rhonchi, symmetric air entry Heart - normal rate, regular rhythm, normal S1, S2, no murmurs, rubs, clicks or gallops Musculoskeletal - point tenderness in proximal right calf, states is in area of previous cramp, no masses, no cords, no swelling in this area Extremities - no pedal edema noted  Patient was ambulated twice around clinic with O2 sats of 98% at the lowest and HR to 106 at max.  Lab Results  Component Value Date   HGBA1C 7.3 08/06/2013    Assessment/Plan: Please see individual problem list.  I have spent >25 minutes in the care of this patient with >50% spent in counseling/coordination of care regarding DM, dental issue, leg pain, and tiredness with walking.

## 2013-08-06 NOTE — Assessment & Plan Note (Addendum)
Occurs following walking long distance and then going up stairs. I had her walked in clinic and she did not desat or become overly tachycardic or short of breath. I suspect she is slightly out of shape and this is contributing to this. Will continue to follow.

## 2013-08-06 NOTE — Assessment & Plan Note (Signed)
In right > left calf. Sounds like muscle cramping. No masses or cords felt on exam. Has been a chronic issue. Will check BMET to determine if electrolyte abnormalities could be contributing.

## 2013-08-06 NOTE — Assessment & Plan Note (Signed)
A1c slightly improved. Will continue current metformin regimen.

## 2013-08-06 NOTE — Patient Instructions (Signed)
Nice to meet you. We will check some labs today for your diabetes and cramping. We will call you with the results. Please stay well hydrated as this will help with your cramping. Please continue to take your metformin. You can take tramadol for your pain. This will not hurt your kidney. I will see you back in about a month for a full physical exam.

## 2013-08-06 NOTE — Assessment & Plan Note (Signed)
Patient without orange card. Once she is able to get the orange card set up we will refer to dentist.

## 2013-08-06 NOTE — Assessment & Plan Note (Signed)
Not currently on statin. Will check lipid panel.

## 2013-08-10 ENCOUNTER — Other Ambulatory Visit: Payer: Self-pay | Admitting: Family Medicine

## 2013-08-10 ENCOUNTER — Telehealth: Payer: Self-pay | Admitting: *Deleted

## 2013-08-10 MED ORDER — GEMFIBROZIL 600 MG PO TABS: 600.0000 mg | ORAL_TABLET | Freq: Two times a day (BID) | ORAL | Status: DC

## 2013-08-10 NOTE — Telephone Encounter (Signed)
Called rx for gemfibrozil into pharmacy and left on their Vm. Jazmin Hartsell,CMA

## 2013-08-10 NOTE — Telephone Encounter (Signed)
pharmacy called and the Lopid 600mg  called in this morning is not covered by the [facility] pharmacy nor at the [facility]. Need something different prescribed

## 2013-08-10 NOTE — Telephone Encounter (Signed)
Could you please call the pharmacy to find out what type of fibrates are covered at these pharmacies. I can then write a correct prescription. Thanks.

## 2013-08-10 NOTE — Telephone Encounter (Signed)
Left message for pt to call back. Muhammad Vacca, CMA  

## 2013-08-10 NOTE — Telephone Encounter (Signed)
Message copied by Henri Medal on Mon Aug 10, 2013  9:44 AM ------      Message from: Birdie Sons, ERIC G      Created: Mon Aug 10, 2013  1:21 AM       Patient with triglycerides to 668. She needs to be started on medication for this. I will send in a prescription for gemfibrozil to be taken every day. We will reassess her triglycerides in 3 months after having been on this medication. Please inform the patient. ------

## 2013-08-11 NOTE — Telephone Encounter (Signed)
Left message with the pharmacy to call back and to get name of a fibrate that is available. Thanks [employer]

## 2013-08-11 NOTE — Telephone Encounter (Signed)
Spoke with Myla at [facility] and they carry Liposen.  They do an eligibility for this on tues, wed, thur from 9-11am or 2-4pm.  Pt can walk in to apply for free medication through the manufacturer.  All they have to provide is their household income.  Rx can still be faxed over and will cal pt when this has been done with what she can do. Karam Dunson,CMA

## 2013-08-12 MED ORDER — FENOFIBRATE 50 MG PO CAPS: 50.0000 mg | ORAL_CAPSULE | Freq: Every day | ORAL | Status: DC

## 2013-08-12 NOTE — Telephone Encounter (Signed)
Left message for pt on indentified VM making her that rx was faxed to pharmacy.  Informed her in message that she needs to contact [facility] on how to apply for this medication for free.  [phone].  Stamatia Masri,CMA

## 2013-08-12 NOTE — Telephone Encounter (Signed)
Prescription for lipofen done and can be faxed or called in to the pharmacy. Thanks.

## 2013-10-02 ENCOUNTER — Telehealth: Payer: Self-pay | Admitting: Family Medicine

## 2013-10-02 ENCOUNTER — Ambulatory Visit: Payer: No Typology Code available for payment source

## 2013-10-02 ENCOUNTER — Telehealth: Payer: Self-pay | Admitting: *Deleted

## 2013-10-02 MED ORDER — FENOFIBRATE 50 MG PO CAPS: 50.0000 mg | ORAL_CAPSULE | Freq: Every day | ORAL | Status: DC

## 2013-10-02 NOTE — Telephone Encounter (Signed)
Pharmacy calling stating that they are out of fenofibrate and patient states she would be able to afford the $60 copay anyway. Pharmacist states they also have mevaclor, simvastatin, or generic lopid patient could try that would be cheaper. Will forward to PCP.

## 2013-10-02 NOTE — Telephone Encounter (Signed)
Pt requesting rx be sent to [facility] pharmacy on [address] For her Lipofen cholesterol med.  She has been out and wanted it [facility] of [facility].  Please respond to this asap.  Patient have not been feeling well since she's been out of med.  Applying for her renewal of OC but have to apply for the Affordable [employer] ins first.

## 2013-10-02 NOTE — Telephone Encounter (Signed)
Refills sent to [facility]. 

## 2013-10-02 NOTE — Telephone Encounter (Signed)
Pt and husband are aware of this. Hareem Surowiec,CMA

## 2013-10-06 MED ORDER — GEMFIBROZIL 600 MG PO TABS: 600.0000 mg | ORAL_TABLET | Freq: Two times a day (BID) | ORAL | Status: DC

## 2013-10-06 NOTE — Telephone Encounter (Signed)
Generic lopid prescription sent to pharmacy.

## 2013-11-11 ENCOUNTER — Other Ambulatory Visit: Payer: Self-pay | Admitting: Family Medicine

## 2013-11-11 NOTE — Telephone Encounter (Signed)
Pt called because she needs a refill on her diabetes medication and also needs to talk to a nurse. jw

## 2013-12-17 ENCOUNTER — Emergency Department (HOSPITAL_COMMUNITY)
Admission: EM | Admit: 2013-12-17 | Discharge: 2013-12-17 | Disposition: A | Discharge status: Home / Self Care | Payer: No Typology Code available for payment source | Attending: Emergency Medicine | Admitting: Emergency Medicine

## 2013-12-17 ENCOUNTER — Emergency Department (HOSPITAL_COMMUNITY): Payer: No Typology Code available for payment source

## 2013-12-17 ENCOUNTER — Encounter (HOSPITAL_COMMUNITY): Payer: Self-pay | Admitting: Emergency Medicine

## 2013-12-17 DIAGNOSIS — R0981 Nasal congestion: Secondary | ICD-10-CM

## 2013-12-17 DIAGNOSIS — Z79899 Other long term (current) drug therapy: Secondary | ICD-10-CM | POA: Insufficient documentation

## 2013-12-17 DIAGNOSIS — R519 Headache, unspecified: Secondary | ICD-10-CM

## 2013-12-17 DIAGNOSIS — R51 Headache: Secondary | ICD-10-CM | POA: Insufficient documentation

## 2013-12-17 DIAGNOSIS — R059 Cough, unspecified: Secondary | ICD-10-CM

## 2013-12-17 DIAGNOSIS — E785 Hyperlipidemia, unspecified: Secondary | ICD-10-CM | POA: Insufficient documentation

## 2013-12-17 DIAGNOSIS — M129 Arthropathy, unspecified: Secondary | ICD-10-CM | POA: Insufficient documentation

## 2013-12-17 DIAGNOSIS — E119 Type 2 diabetes mellitus without complications: Secondary | ICD-10-CM | POA: Insufficient documentation

## 2013-12-17 DIAGNOSIS — J029 Acute pharyngitis, unspecified: Secondary | ICD-10-CM

## 2013-12-17 DIAGNOSIS — R509 Fever, unspecified: Secondary | ICD-10-CM | POA: Insufficient documentation

## 2013-12-17 DIAGNOSIS — R05 Cough: Secondary | ICD-10-CM | POA: Insufficient documentation

## 2013-12-17 DIAGNOSIS — J3489 Other specified disorders of nose and nasal sinuses: Secondary | ICD-10-CM | POA: Insufficient documentation

## 2013-12-17 LAB — RAPID STREP SCREEN (MED CTR MEBANE ONLY): Streptococcus, Group A Screen (Direct): NEGATIVE

## 2013-12-17 IMAGING — CR DG CHEST 2V
2 series · 2 of 2 positions shown · non-contrast
Comparison: [DATE] chest radiograph

CLINICAL DATA: 47-year-old with shortness of breath

EXAM:
CHEST  2 VIEW

[w chest pa]
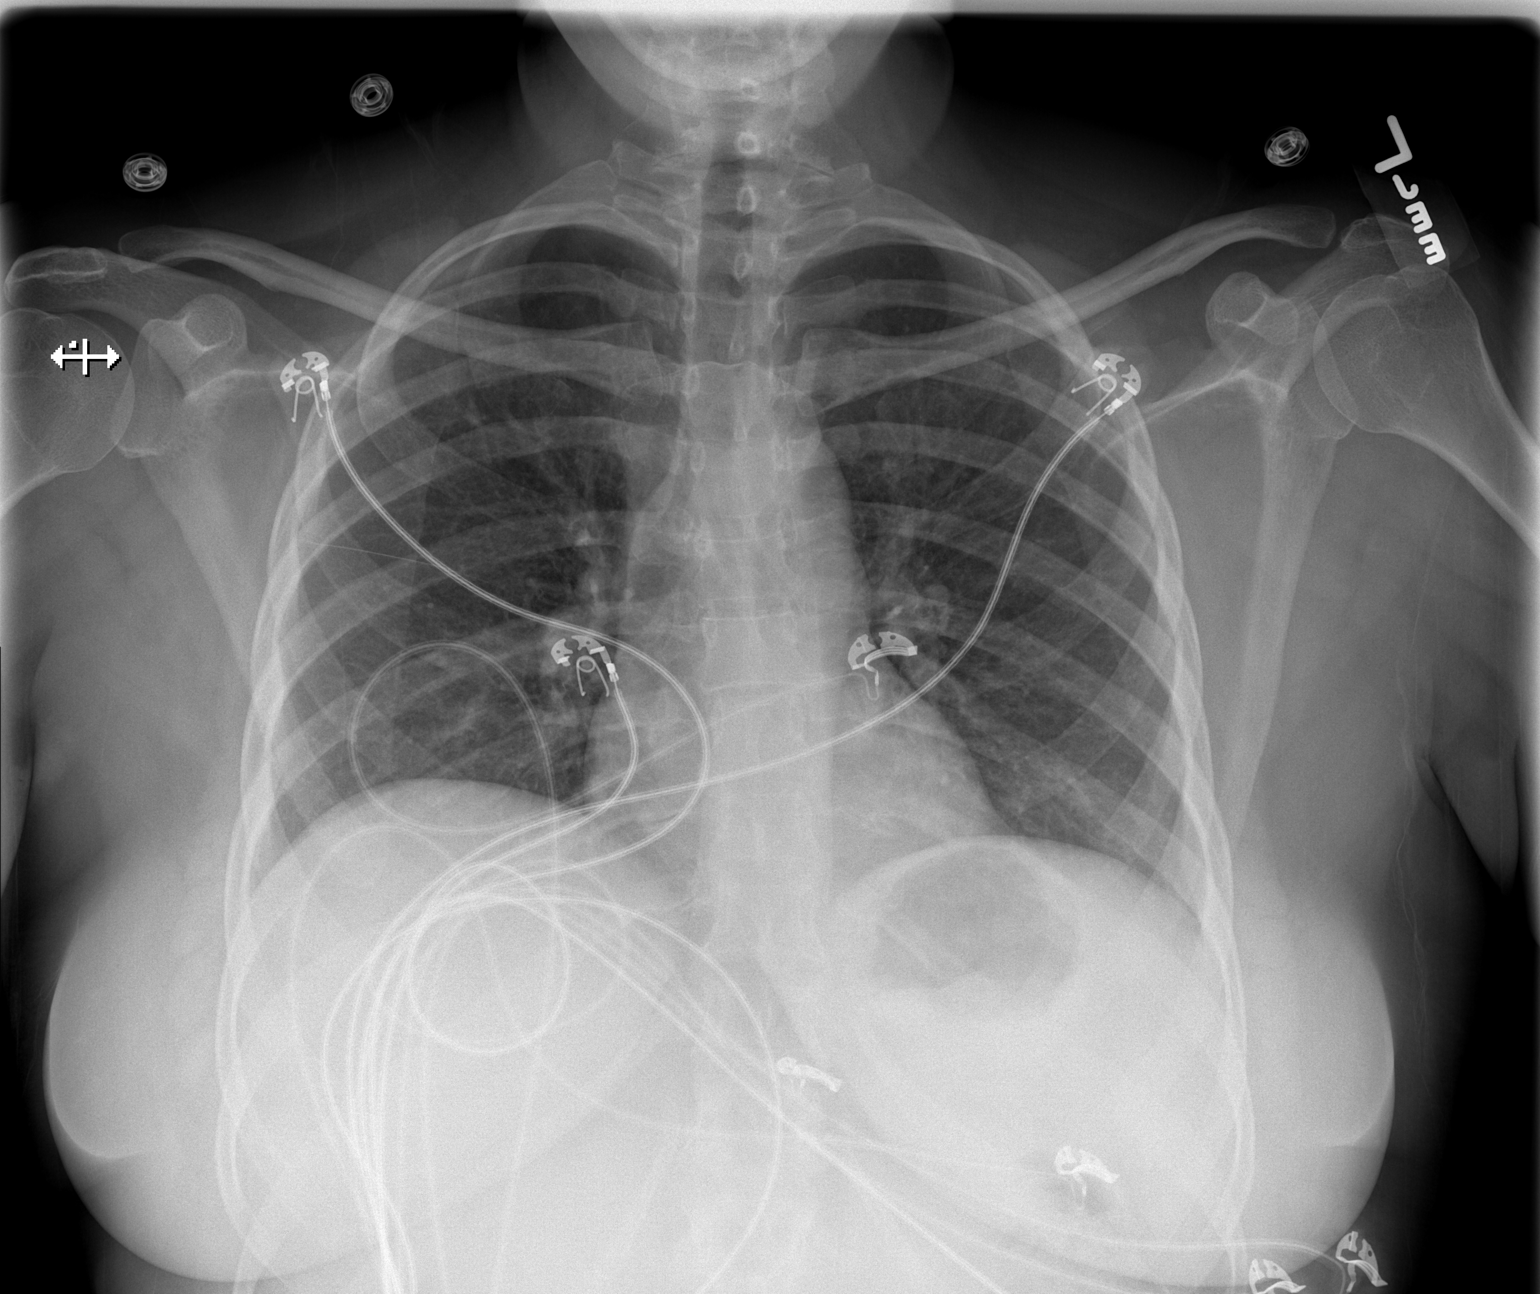

[w chest lat]
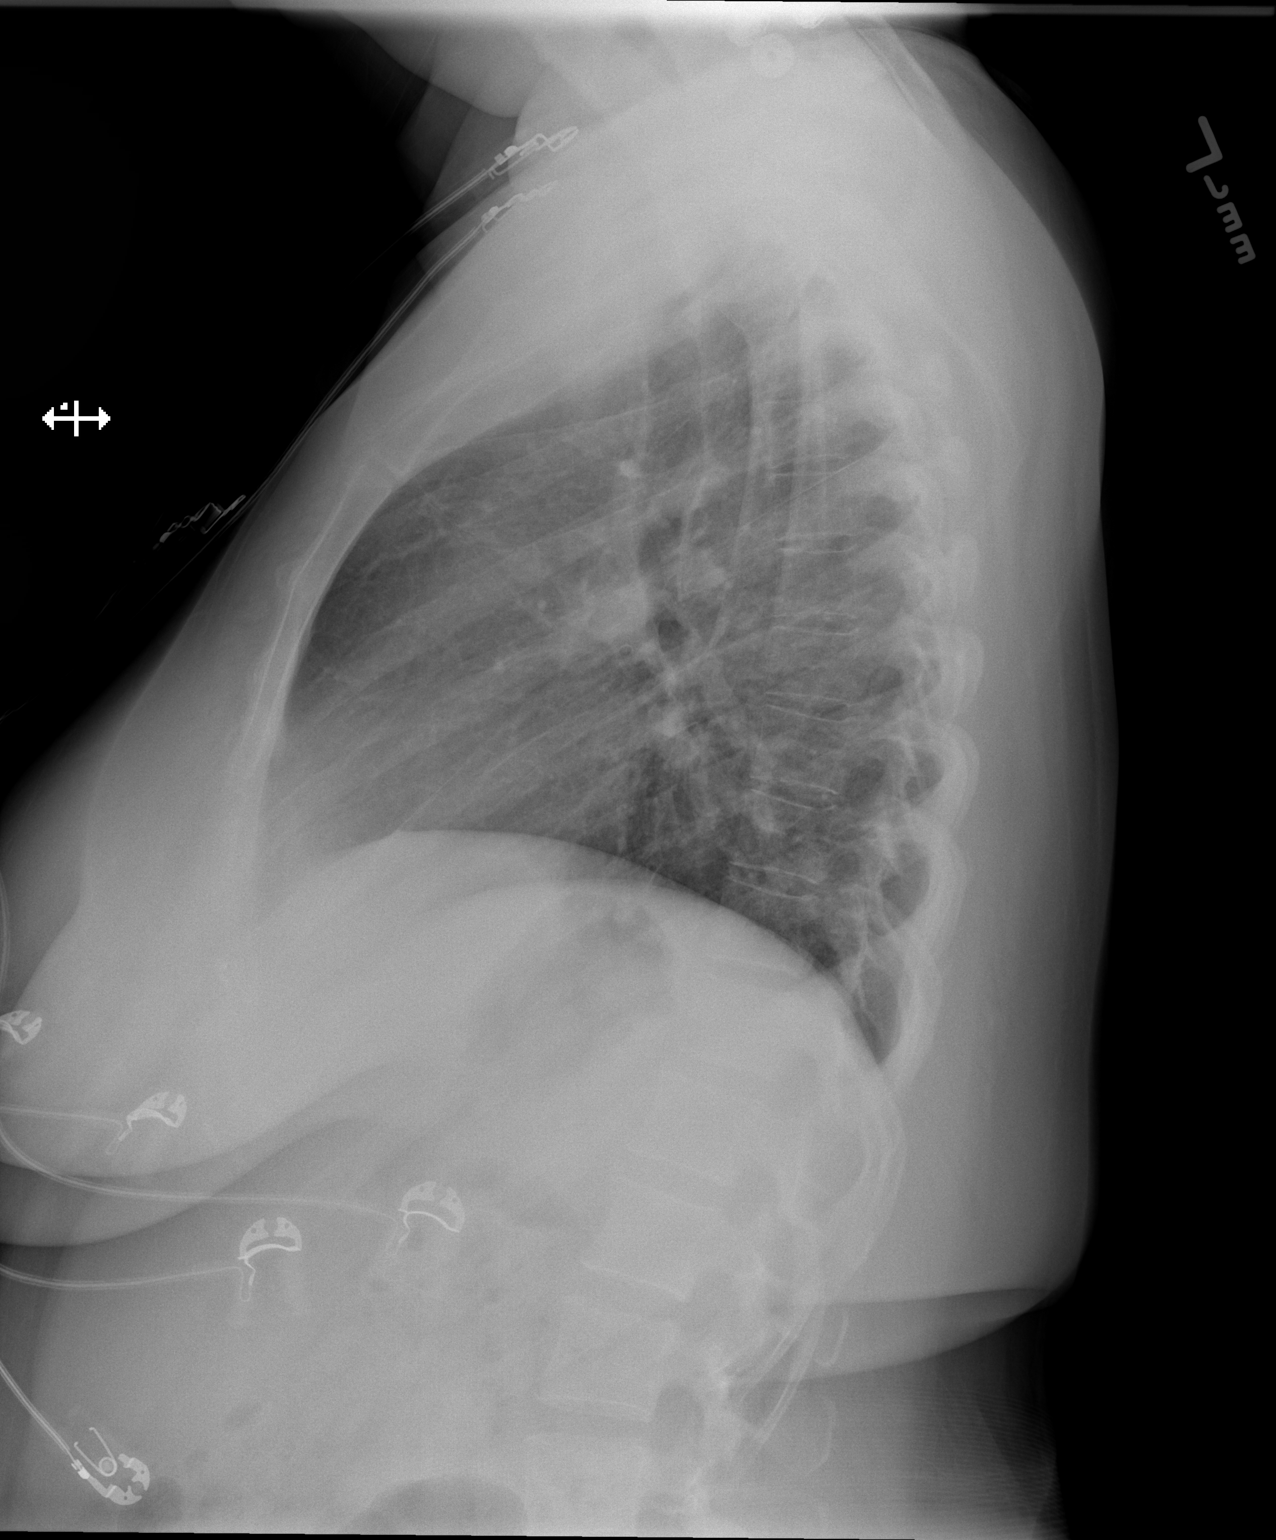

[2 of 2 positions shown; findings below may reference images not displayed]

FINDINGS: The cardiomediastinal silhouette is unremarkable.

The lungs are clear.

There is no evidence of focal airspace disease, pulmonary edema,
suspicious pulmonary nodule/mass, pleural effusion, or pneumothorax.
No acute bony abnormalities are identified.
IMPRESSION: No active cardiopulmonary disease.

## 2013-12-17 MED ORDER — METOCLOPRAMIDE HCL 5 MG/ML IJ SOLN
5.0000 mg | Freq: Once | INTRAMUSCULAR | Status: AC
  Administered 2013-12-17: 5 mg via INTRAVENOUS
  Filled 2013-12-17: qty 2

## 2013-12-17 MED ORDER — SODIUM CHLORIDE 0.9 % IV BOLUS (SEPSIS)
1000.0000 mL | INTRAVENOUS | Status: AC
  Administered 2013-12-17: 1000 mL via INTRAVENOUS

## 2013-12-17 MED ORDER — DIPHENHYDRAMINE HCL 50 MG/ML IJ SOLN
25.0000 mg | Freq: Once | INTRAMUSCULAR | Status: AC
  Administered 2013-12-17: 25 mg via INTRAVENOUS
  Filled 2013-12-17: qty 1

## 2013-12-17 MED ORDER — OXYMETAZOLINE HCL 0.05 % NA SOLN
1.0000 | Freq: Once | NASAL | Status: AC
  Administered 2013-12-17: 1 via NASAL
  Filled 2013-12-17: qty 15

## 2013-12-17 MED ORDER — KETOROLAC TROMETHAMINE 30 MG/ML IJ SOLN
30.0000 mg | Freq: Once | INTRAMUSCULAR | Status: AC
  Administered 2013-12-17: 30 mg via INTRAVENOUS
  Filled 2013-12-17: qty 1

## 2013-12-17 NOTE — ED Provider Notes (Signed)
CSN: [id number]     Arrival date & time 12/17/13  1805 History   First MD Initiated Contact with Patient 12/17/13 1812     Chief Complaint  Patient presents with  . Shortness of Breath     Patient is a 48 y.o. female presenting with pharyngitis.  Sore Throat This is a new problem. The current episode started yesterday. The problem occurs constantly. The problem has been gradually worsening. Associated symptoms include chills, coughing, a fever ( subjective) and a sore throat. Pertinent negatives include no abdominal pain, chest pain, congestion, headaches, nausea, neck pain, rash, vomiting or weakness. Nothing aggravates the symptoms. She has tried NSAIDs and acetaminophen for the symptoms. The treatment provided mild relief.    Past Medical History  Diagnosis Date  . Diabetes mellitus   . Hyperlipidemia   . Arthritis    Past Surgical History  Procedure Laterality Date  . Cesarean section     Family History  Problem Relation Age of Onset  . Diabetes Mother   . Heart disease Mother   . Diabetes Father   . Cancer Father 40    Throat  . Diabetes Sister   . Diabetes Brother    History  Substance Use Topics  . Smoking status: Passive Smoke Exposure - Never Smoker  . Smokeless tobacco: Not on file  . Alcohol Use: No   OB History   Grav Para Term Preterm Abortions TAB SAB Ect Mult Living                 Review of Systems  Constitutional: Positive for fever ( subjective) and chills. Negative for activity change and appetite change.  HENT: Positive for sore throat. Negative for congestion, ear pain and rhinorrhea.   Eyes: Negative for pain.  Respiratory: Positive for cough. Negative for shortness of breath.   Cardiovascular: Negative for chest pain and palpitations.  Gastrointestinal: Negative for nausea, vomiting and abdominal pain.  Genitourinary: Negative for dysuria, difficulty urinating and pelvic pain.  Musculoskeletal: Negative for back pain and neck pain.  Skin:  Negative for rash and wound.  Neurological: Negative for weakness and headaches.  Psychiatric/Behavioral: Negative for behavioral problems, confusion and agitation.      Allergies  Review of patient's allergies indicates no known allergies.  Home Medications   Current Outpatient Rx  Name  Route  Sig  Dispense  Refill  . acetaminophen (TYLENOL) 500 MG tablet   Oral   Take 1,000 mg by mouth daily as needed for mild pain.         Marland Kitchen gemfibrozil (LOPID) 600 MG tablet   Oral   Take 600 mg by mouth 2 (two) times daily before a meal.         . ibuprofen (ADVIL,MOTRIN) 200 MG tablet   Oral   Take 400 mg by mouth daily as needed for mild pain.         . metFORMIN (GLUCOPHAGE-XR) 500 MG 24 hr tablet   Oral   Take 500 mg by mouth daily with breakfast.          BP 148/85  Pulse 100  Temp(Src) 97.4 F (36.3 C) (Oral)  Resp 16  Ht 5\' 5"  (1.651 m)  SpO2 100%  LMP 11/26/2013 Physical Exam  Constitutional: She is oriented to person, place, and time. She appears well-developed and well-nourished. No distress.  HENT:  Head: Normocephalic and atraumatic.  Nose: Nose normal.  Mouth/Throat: Oropharynx is clear and moist.  Eyes: EOM are normal. Pupils  are equal, round, and reactive to light.  Neck: Normal range of motion. Neck supple. No tracheal deviation present.  Cardiovascular: Regular rhythm, normal heart sounds and intact distal pulses.   Tachycardia   Pulmonary/Chest: Effort normal and breath sounds normal. She has no rales.  Abdominal: Soft. Bowel sounds are normal. She exhibits no distension. There is no tenderness. There is no rebound and no guarding.  Musculoskeletal: Normal range of motion. She exhibits no tenderness.  Neurological: She is alert and oriented to person, place, and time.  Skin: Skin is warm and dry. No rash noted.  Psychiatric: She has a normal mood and affect. Her behavior is normal.    ED Course  Procedures  Labs Review Labs Reviewed  RAPID  STREP SCREEN  CULTURE, GROUP A STREP  INFLUENZA PANEL BY PCR (TYPE A & B, H1N1)  URINALYSIS, ROUTINE W REFLEX MICROSCOPIC   Imaging Review Dg Chest 2 View  12/17/2013   CLINICAL DATA:  48 year old with shortness of breath  EXAM: CHEST  2 VIEW  COMPARISON:  09/24/2005 chest radiograph  FINDINGS: The cardiomediastinal silhouette is unremarkable.  The lungs are clear.  There is no evidence of focal airspace disease, pulmonary edema, suspicious pulmonary nodule/mass, pleural effusion, or pneumothorax. No acute bony abnormalities are identified.  IMPRESSION: No active cardiopulmonary disease.   Electronically Signed   By: Hassan Rowan M.D.   On: 12/17/2013 19:01    EKG Interpretation   None       MDM   Final diagnoses:  Cough  Headache  Nasal congestion  Sore throat    48 yo F in NAD AFVSS non toxic appearing presents with 24 hours of sore throat , cough, and subjective fevers. Patients is afebrile here ut took tylenol and motrin at home. She additionally took some left over anabiotics ( not sure what they were called) Patient denies any neck stiffness but reports diffuse myalgias and a mild headache. No meningismus. Doubt meningitis.   CXR with [facility]. Strep swab negative.   Flu swab obtained but will not result today. Feel patient is not a high risk patient. Opted not to treat with tamiflu.   Thorough discussion with patient on plan, findings, return precautions. Patient is feeling much better after afrin and migraine cocktail. Tolerating po and ambulatory. Case co managed with my attending Dr. Aline Brochure.   Strong return precautions given . Follow up with PCP in 2-3 days or sooner in ED if worsening or any concerns.     Ruthell Rummage, MD 12/17/13 2233

## 2013-12-17 NOTE — ED Notes (Addendum)
Pt reports since yesterday she has had a fever with sob, sore throat, headache and cough and back pain. Pt taking tylenol and ibuprofen at home for fever. Lung sounds clear.

## 2013-12-17 NOTE — Discharge Instructions (Signed)
Cough, Adult  A cough is a reflex that helps clear your throat and airways. It can help heal the body or may be a reaction to an irritated airway. A cough may only last 2 or 3 weeks (acute) or may last more than 8 weeks (chronic).  CAUSES Acute cough:  Viral or bacterial infections. Chronic cough:  Infections.  Allergies.  Asthma.  Post-nasal drip.  Smoking.  Heartburn or acid reflux.  Some medicines.  Chronic lung problems (COPD).  Cancer. SYMPTOMS   Cough.  Fever.  Chest pain.  Increased breathing rate.  High-pitched whistling sound when breathing (wheezing).  Colored mucus that you cough up (sputum). TREATMENT   A bacterial cough may be treated with antibiotic medicine.  A viral cough must run its course and will not respond to antibiotics.  Your caregiver may recommend other treatments if you have a chronic cough. HOME CARE INSTRUCTIONS   Only take over-the-counter or prescription medicines for pain, discomfort, or fever as directed by your caregiver. Use cough suppressants only as directed by your caregiver.  Use a cold steam vaporizer or humidifier in your bedroom or home to help loosen secretions.  Sleep in a semi-upright position if your cough is worse at night.  Rest as needed.  Stop smoking if you smoke. SEEK IMMEDIATE MEDICAL CARE IF:   You have pus in your sputum.  Your cough starts to worsen.  You cannot control your cough with suppressants and are losing sleep.  You begin coughing up blood.  You have difficulty breathing.  You develop pain which is getting worse or is uncontrolled with medicine.  You have a fever. MAKE SURE YOU:   Understand these instructions.  Will watch your condition.  Will get help right away if you are not doing well or get worse. Document Released: 04/06/2011 Document Revised: 12/31/2011 Document Reviewed: 04/06/2011 [facility] Patient Information 2014 [place].  Pharyngitis Pharyngitis is a  sore throat (pharynx). There is redness, pain, and swelling of your throat. HOME CARE   Drink enough fluids to keep your pee (urine) clear or pale yellow.  Only take medicine as told by your doctor.  You may get sick again if you do not take medicine as told. Finish your medicines, even if you start to feel better.  Do not take aspirin.  Rest.  Rinse your mouth (gargle) with salt water ( tsp of salt per 1 qt of water) every 1 2 hours. This will help the pain.  If you are not at risk for choking, you can suck on hard candy or sore throat lozenges. GET HELP IF:  You have large, tender lumps on your neck.  You have a rash.  You cough up green, yellow-brown, or bloody spit. GET HELP RIGHT AWAY IF:   You have a stiff neck.  You drool or cannot swallow liquids.  You throw up (vomit) or are not able to keep medicine or liquids down.  You have very bad pain that does not go away with medicine.  You have problems breathing (not from a stuffy nose). MAKE SURE YOU:   Understand these instructions.  Will watch your condition.  Will get help right away if you are not doing well or get worse. Document Released: 03/26/2008 Document Revised: 07/29/2013 Document Reviewed: 06/15/2013 [facility] Patient Information 2014 [place].  Sore Throat A sore throat is a painful, burning, sore, or scratchy feeling of the throat. There may be pain or tenderness when swallowing or talking. You may have  other symptoms with a sore throat. These include coughing, sneezing, fever, or a swollen neck. A sore throat is often the first sign of another sickness. These sicknesses may include a cold, flu, strep throat, or an infection called mono. Most sore throats go away without medical treatment.  HOME CARE   Only take medicine as told by your doctor.  Drink enough fluids to keep your pee (urine) clear or pale yellow.  Rest as needed.  Try using throat sprays, lozenges, or suck on hard candy (if  older than 4 years or as told).  Sip warm liquids, such as broth, herbal tea, or warm water with honey. Try sucking on frozen ice pops or drinking cold liquids.  Rinse the mouth (gargle) with salt water. Mix 1 teaspoon salt with 8 ounces of water.  Do not smoke. Avoid being around others when they are smoking.  Put a humidifier in your bedroom at night to moisten the air. You can also turn on a hot shower and sit in the bathroom for 5 10 minutes. Be sure the bathroom door is closed. GET HELP RIGHT AWAY IF:   You have trouble breathing.  You cannot swallow fluids, soft foods, or your spit (saliva).  You have more puffiness (swelling) in the throat.  Your sore throat does not get better in 7 days.  You feel sick to your stomach (nauseous) and throw up (vomit).  You have a fever or lasting symptoms for more than 2 3 days.  You have a fever and your symptoms suddenly get worse. MAKE SURE YOU:   Understand these instructions.  Will watch your condition.  Will get help right away if you are not doing well or get worse. Document Released: 07/17/2008 Document Revised: 07/02/2012 Document Reviewed: 06/15/2012 [name] Patient Information 2014 [place], [place].

## 2013-12-18 LAB — INFLUENZA PANEL BY PCR (TYPE A & B)
H1N1FLUPCR: NOT DETECTED
Influenza A By PCR: NEGATIVE
Influenza B By PCR: NEGATIVE

## 2013-12-18 NOTE — ED Provider Notes (Signed)
Medical screening examination/treatment/procedure(s) were conducted as a shared visit with resident physician and myself.  I personally evaluated the patient during the encounter.  Date: 12/17/2013  Rate: 96  Rhythm: normal sinus rhythm  QRS Axis: normal  Intervals: normal  ST/T Wave abnormalities: normal  Conduction Disutrbances:none  Narrative Interpretation: No ST or T wave changes consistent with ischemia.  Old EKG Reviewed: none available   I interviewed and examined the patient. Lungs are CTAB. Cardiac exam wnl. Abdomen soft.  Likely viral URI. Pt well appearing on exam. Feeling better after tx here. I discussed her feeling of sob. After a thorough discussion she notes that she is not sob, but has nasal congestion and is having to breath through her mouth.  Blanchard Kelch, MD 12/18/13 1352

## 2013-12-19 LAB — CULTURE, GROUP A STREP

## 2015-07-14 ENCOUNTER — Emergency Department (HOSPITAL_COMMUNITY)
Admission: EM | Admit: 2015-07-14 | Discharge: 2015-07-14 | Disposition: A | Discharge status: Home / Self Care | Payer: 59 | Attending: Emergency Medicine | Admitting: Emergency Medicine

## 2015-07-14 ENCOUNTER — Emergency Department (HOSPITAL_COMMUNITY): Payer: 59

## 2015-07-14 ENCOUNTER — Encounter (HOSPITAL_COMMUNITY): Payer: Self-pay | Admitting: Emergency Medicine

## 2015-07-14 DIAGNOSIS — Z79899 Other long term (current) drug therapy: Secondary | ICD-10-CM | POA: Insufficient documentation

## 2015-07-14 DIAGNOSIS — E119 Type 2 diabetes mellitus without complications: Secondary | ICD-10-CM | POA: Insufficient documentation

## 2015-07-14 DIAGNOSIS — L089 Local infection of the skin and subcutaneous tissue, unspecified: Secondary | ICD-10-CM | POA: Diagnosis present

## 2015-07-14 DIAGNOSIS — L02511 Cutaneous abscess of right hand: Secondary | ICD-10-CM

## 2015-07-14 DIAGNOSIS — M1990 Unspecified osteoarthritis, unspecified site: Secondary | ICD-10-CM | POA: Insufficient documentation

## 2015-07-14 LAB — CBG MONITORING, ED: Glucose-Capillary: 102 mg/dL — ABNORMAL HIGH (ref 65–99)

## 2015-07-14 IMAGING — DX DG FINGER THUMB 2+V*R*
3 series · 3 of 3 positions shown · non-contrast
Comparison: None.

CLINICAL DATA: Pain and swelling after thumb laceration 2 days ago.

EXAM:
RIGHT THUMB 2+V

[finger ap]
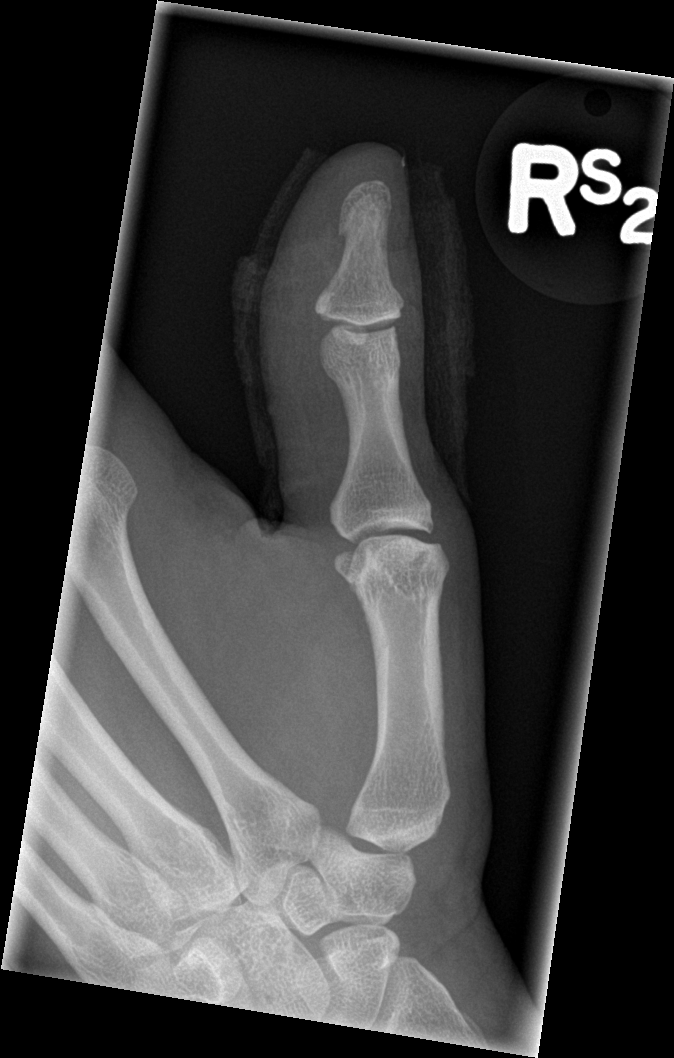

[finger obl]
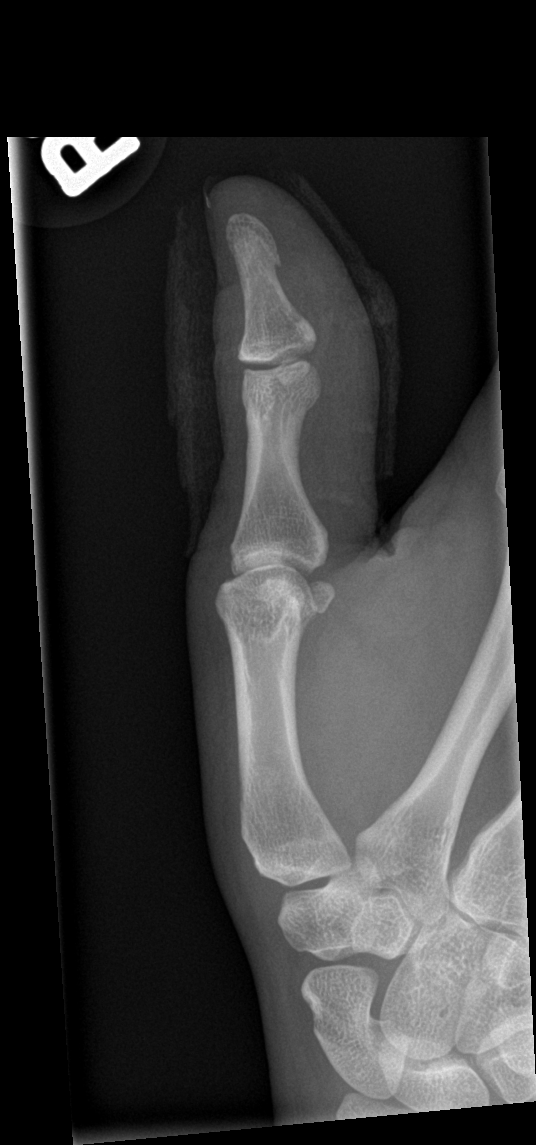

[finger lat]
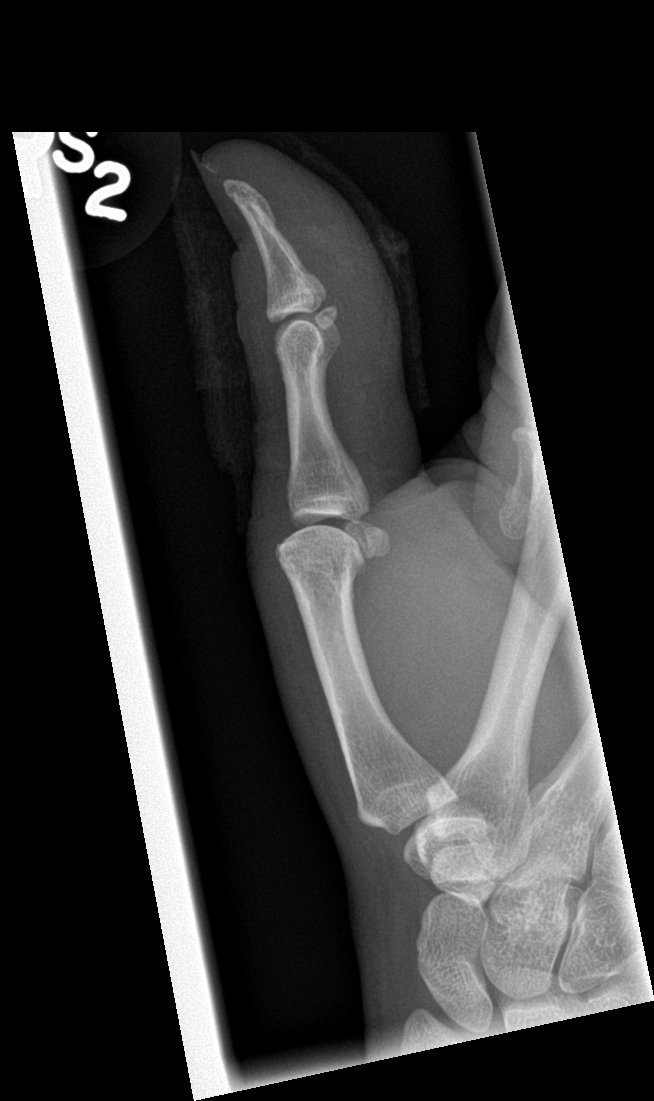

[3 of 3 positions shown; findings below may reference images not displayed]

FINDINGS: There is no evidence of fracture or dislocation. There is no
evidence of arthropathy or other focal bone abnormality. No
radiopaque foreign body is noted.
IMPRESSION: No bony abnormality seen in the right thumb.

## 2015-07-14 MED ORDER — CEPHALEXIN 500 MG PO CAPS: 500.0000 mg | ORAL_CAPSULE | Freq: Two times a day (BID) | ORAL | Status: DC

## 2015-07-14 MED ORDER — BUPIVACAINE HCL (PF) 0.5 % IJ SOLN: 10.0000 mL | Freq: Once | INTRAMUSCULAR | Status: DC

## 2015-07-14 MED ORDER — LIDOCAINE HCL (PF) 1 % IJ SOLN
5.0000 mL | Freq: Once | INTRAMUSCULAR | Status: AC
  Administered 2015-07-14: 5 mL
  Filled 2015-07-14: qty 5

## 2015-07-14 NOTE — Discharge Instructions (Signed)
Abscess  An abscess is an infected area that contains a collection of pus and debris.It can occur in almost any part of the body. An abscess is also known as a furuncle or boil.  CAUSES   An abscess occurs when tissue gets infected. This can occur from blockage of oil or sweat glands, infection of hair follicles, or a minor injury to the skin. As the body tries to fight the infection, pus collects in the area and creates pressure under the skin. This pressure causes pain. People with weakened immune systems have difficulty fighting infections and get certain abscesses more often.   SYMPTOMS  Usually an abscess develops on the skin and becomes a painful mass that is red, warm, and tender. If the abscess forms under the skin, you may feel a moveable soft area under the skin. Some abscesses break open (rupture) on their own, but most will continue to get worse without care. The infection can spread deeper into the body and eventually into the bloodstream, causing you to feel ill.   DIAGNOSIS   Your caregiver will take your medical history and perform a physical exam. A sample of fluid may also be taken from the abscess to determine what is causing your infection.  TREATMENT   Your caregiver may prescribe antibiotic medicines to fight the infection. However, taking antibiotics alone usually does not cure an abscess. Your caregiver may need to make a small cut (incision) in the abscess to drain the pus. In some cases, gauze is packed into the abscess to reduce pain and to continue draining the area.  HOME CARE INSTRUCTIONS    Only take over-the-counter or prescription medicines for pain, discomfort, or fever as directed by your caregiver.   If you were prescribed antibiotics, take them as directed. Finish them even if you start to feel better.   If gauze is used, follow your caregiver's directions for changing the gauze.   To avoid spreading the infection:   Keep your draining abscess covered with a  bandage.   Wash your hands well.   Do not share personal care items, towels, or whirlpools with others.   Avoid skin contact with others.   Keep your skin and clothes clean around the abscess.   Keep all follow-up appointments as directed by your caregiver.  SEEK MEDICAL CARE IF:    You have increased pain, swelling, redness, fluid drainage, or bleeding.   You have muscle aches, chills, or a general ill feeling.   You have a fever.  MAKE SURE YOU:    Understand these instructions.   Will watch your condition.   Will get help right away if you are not doing well or get worse.  Document Released: 07/18/2005 Document Revised: 04/08/2012 Document Reviewed: 12/21/2011  [employer] Patient Information 2015 [employer]. This information is not intended to replace advice given to you by your health care provider. Make sure you discuss any questions you have with your health care provider.  Incision and Drainage  Incision and drainage is a procedure in which a sac-like structure (cystic structure) is opened and drained. The area to be drained usually contains material such as pus, fluid, or blood.   LET YOUR CAREGIVER KNOW ABOUT:    Allergies to medicine.   Medicines taken, including vitamins, herbs, eyedrops, over-the-counter medicines, and creams.   Use of steroids (by mouth or creams).   Previous problems with anesthetics or numbing medicines.   History of bleeding problems or blood clots.     Previous surgery.   Other health problems, including diabetes and kidney problems.   Possibility of pregnancy, if this applies.  RISKS AND COMPLICATIONS   Pain.   Bleeding.   Scarring.   Infection.  BEFORE THE PROCEDURE   You may need to have an ultrasound or other imaging tests to see how large or deep your cystic structure is. Blood tests may also be used to determine if you have an infection or how severe the infection is. You may need to have a tetanus shot.  PROCEDURE   The affected area is cleaned with a  cleaning fluid. The cyst area will then be numbed with a medicine (local anesthetic). A small incision will be made in the cystic structure. A syringe or catheter may be used to drain the contents of the cystic structure, or the contents may be squeezed out. The area will then be flushed with a cleansing solution. After cleansing the area, it is often gently packed with a gauze or another wound dressing. Once it is packed, it will be covered with gauze and tape or some other type of wound dressing.  AFTER THE PROCEDURE    Often, you will be allowed to go home right after the procedure.   You may be given antibiotic medicine to prevent or heal an infection.   If the area was packed with gauze or some other wound dressing, you will likely need to come back in 1 to 2 days to get it removed.   The area should heal in about 14 days.  Document Released: 04/03/2001 Document Revised: 04/08/2012 Document Reviewed: 12/03/2011  [employer] Patient Information 2015 [employer]. This information is not intended to replace advice given to you by your health care provider. Make sure you discuss any questions you have with your health care provider.

## 2015-07-14 NOTE — ED Notes (Signed)
C/o right thumb infection, has been seen by dr, taking medicine, states it is still draining. Reddened and swollen  Nilozol 500mg  bid amoxycillan bid

## 2015-07-14 NOTE — ED Notes (Signed)
CBG 102 

## 2015-07-14 NOTE — ED Provider Notes (Signed)
CSN: [id number]     Arrival date & time 07/14/15  1123 History  This chart was scribed for Margarita Mail, PA-C, working with Frances Nickels, MD by Starleen Arms, ED Scribe. This patient was seen in room TR10C/TR10C and the patient's care was started at 12:46 PM.   Chief Complaint  Patient presents with  . finger infection    The history is provided by the patient. No language interpreter was used.   HPI Comments: Rebecca [name] is a 49 y.o. female with hx of DM who presents to the Emergency Department for wound check for a moderately painful finger right thumb finger infection onset 9/19.  She was seen recently for this complaint in [place] and diagnosed with paronychia.  The complaint was I&D'd and she was prescribed nilozol and amoxicillin, which she has taken.  Overall the complaint has improved significantly since initial treatment but began to slightly worsen and swell minimally this morning.    Past Medical History  Diagnosis Date  . Diabetes mellitus   . Hyperlipidemia   . Arthritis    Past Surgical History  Procedure Laterality Date  . Cesarean section     Family History  Problem Relation Age of Onset  . Diabetes Mother   . Heart disease Mother   . Diabetes Father   . Cancer Father 57    Throat  . Diabetes Sister   . Diabetes Brother    Social History  Substance Use Topics  . Smoking status: Passive Smoke Exposure - Never Smoker  . Smokeless tobacco: None  . Alcohol Use: No   OB History    No data available     Review of Systems  Constitutional: Negative for fever and chills.  Endocrine: Negative for polydipsia, polyphagia and polyuria.  Skin: Positive for wound.  All other systems reviewed and are negative.     Allergies  Review of patient's allergies indicates no known allergies.  Home Medications   Prior to Admission medications   Medication Sig Start Date End Date Taking? Authorizing Provider  acetaminophen (TYLENOL) 500 MG tablet Take 1,000 mg by  mouth daily as needed for mild pain.    Historical Provider, MD  cephALEXin (KEFLEX) 500 MG capsule Take 1 capsule (500 mg total) by mouth 2 (two) times daily. 07/14/15   Margarita Mail, PA-C  gemfibrozil (LOPID) 600 MG tablet Take 600 mg by mouth 2 (two) times daily before a meal.    Historical Provider, MD  ibuprofen (ADVIL,MOTRIN) 200 MG tablet Take 400 mg by mouth daily as needed for mild pain.    Historical Provider, MD  metFORMIN (GLUCOPHAGE-XR) 500 MG 24 hr tablet Take 500 mg by mouth daily with breakfast.    Historical Provider, MD   BP 160/98 mmHg  Pulse 80  Temp(Src) 97.8 F (36.6 C) (Oral)  Resp 18  SpO2 100%  LMP 07/14/2015 (Exact Date) Physical Exam  Constitutional: She is oriented to person, place, and time. She appears well-developed and well-nourished. No distress.  HENT:  Head: Normocephalic and atraumatic.  Eyes: Conjunctivae and EOM are normal.  Neck: Neck supple. No tracheal deviation present.  Cardiovascular: Normal rate.   Pulmonary/Chest: Effort normal. No respiratory distress.  Musculoskeletal: Normal range of motion.  Neurological: She is alert and oriented to person, place, and time.  Skin: Skin is warm and dry.  Right thumb: Swelling, induration, and small fluctuance.  Fingers and feet with henna decorations.  Psychiatric: She has a normal mood and affect. Her behavior is normal.  Nursing note and vitals reviewed.       ED Course  Procedures (including critical care time)  DIAGNOSTIC STUDIES: Oxygen Saturation is 100% on RA, normal by my interpretation.    COORDINATION OF CARE:  12:55 PM Informed patient of plans to perform I&D.  Will consult with hand surgeon.  Patient acknowledges and agrees with plan.    INCISION AND DRAINAGE PROCEDURE NOTE: Patient identification was confirmed and verbal consent was obtained. This procedure was performed by Margarita Mail, PA-C at 12:58 PM. Site: R thumb Sterile procedures observed Anesthetic used (type  and amt): Bupivicaine 0.5% Blade size: 11 Drainage: moderate Complexity: simple Site anesthetized, incision made over site, wound drained and explored loculations, rinsed with copious amounts of normal saline, wound packed with sterile gauze, covered with dry, sterile dressing.  Pt tolerated procedure well without complications.  Instructions for care discussed verbally and pt provided with additional written instructions for homecare and f/u.  Labs Review Labs Reviewed  CBG MONITORING, ED - Abnormal; Notable for the following:    Glucose-Capillary 102 (*)    All other components within normal limits    Imaging Review Dg Finger Thumb Right  07/14/2015   CLINICAL DATA:  Pain and swelling after thumb laceration 2 days ago.  EXAM: RIGHT THUMB 2+V  COMPARISON:  None.  FINDINGS: There is no evidence of fracture or dislocation. There is no evidence of arthropathy or other focal bone abnormality. No radiopaque foreign body is noted.  IMPRESSION: No bony abnormality seen in the right thumb.   Electronically Signed   By: Marijo Conception, M.D.   On: 07/14/2015 14:09   I have personally reviewed and evaluated these images and lab results as part of my medical decision-making.   EKG Interpretation None      MDM   Final diagnoses:  Abscess of thumb, right    Patient with recurrent abscess. No fbs on xray Wound incised and drained.  GBG wnl. Patient will be dischaged on  Keflex to follow p wit Hand.  Discussed with attendign physician. Appears safe for disharge at this time    I personally performed the services described in this documentation, which was scribed in my presence. The recorded information has been reviewed and is accurate.     Margarita Mail, PA-C 07/14/15 2306  Leo Grosser, MD 07/15/15 [id number]

## 2016-01-16 ENCOUNTER — Encounter: Payer: Self-pay | Admitting: Family Medicine

## 2016-01-16 ENCOUNTER — Ambulatory Visit (INDEPENDENT_AMBULATORY_CARE_PROVIDER_SITE_OTHER): Payer: No Typology Code available for payment source | Admitting: Family Medicine

## 2016-01-16 VITALS — BP 147/100 | HR 101 | Temp 97.6°F | Ht 63.0 in | Wt 155.0 lb

## 2016-01-16 DIAGNOSIS — Z0000 Encounter for general adult medical examination without abnormal findings: Secondary | ICD-10-CM

## 2016-01-16 DIAGNOSIS — E138 Other specified diabetes mellitus with unspecified complications: Secondary | ICD-10-CM

## 2016-01-16 DIAGNOSIS — R102 Pelvic and perineal pain: Secondary | ICD-10-CM

## 2016-01-16 DIAGNOSIS — I10 Essential (primary) hypertension: Secondary | ICD-10-CM

## 2016-01-16 LAB — CBC WITH DIFFERENTIAL/PLATELET
Basophils Absolute: 0 10*3/uL (ref 0.0–0.1)
Basophils Relative: 0 % (ref 0–1)
Eosinophils Absolute: 0.2 10*3/uL (ref 0.0–0.7)
Eosinophils Relative: 3 % (ref 0–5)
HCT: 39.6 % (ref 36.0–46.0)
Hemoglobin: 13 g/dL (ref 12.0–15.0)
LYMPHS ABS: 2.7 10*3/uL (ref 0.7–4.0)
LYMPHS PCT: 37 % (ref 12–46)
MCH: 26.1 pg (ref 26.0–34.0)
MCHC: 32.8 g/dL (ref 30.0–36.0)
MCV: 79.5 fL (ref 78.0–100.0)
MPV: 9.8 fL (ref 8.6–12.4)
Monocytes Absolute: 0.4 10*3/uL (ref 0.1–1.0)
Monocytes Relative: 5 % (ref 3–12)
Neutro Abs: 4 10*3/uL (ref 1.7–7.7)
Neutrophils Relative %: 55 % (ref 43–77)
PLATELETS: 318 10*3/uL (ref 150–400)
RBC: 4.98 MIL/uL (ref 3.87–5.11)
RDW: 14.1 % (ref 11.5–15.5)
WBC: 7.2 10*3/uL (ref 4.0–10.5)

## 2016-01-16 LAB — POCT URINALYSIS DIP (DEVICE)
BILIRUBIN URINE: NEGATIVE
GLUCOSE, UA: NEGATIVE mg/dL
HGB URINE DIPSTICK: NEGATIVE
KETONES UR: NEGATIVE mg/dL
Nitrite: NEGATIVE
Protein, ur: NEGATIVE mg/dL
UROBILINOGEN UA: 0.2 mg/dL (ref 0.0–1.0)
pH: 5.5 (ref 5.0–8.0)

## 2016-01-16 MED ORDER — LISINOPRIL 20 MG PO TABS: 20.0000 mg | ORAL_TABLET | Freq: Every day | ORAL | Status: DC

## 2016-01-16 MED ORDER — METFORMIN HCL 500 MG PO TABS: 500.0000 mg | ORAL_TABLET | Freq: Two times a day (BID) | ORAL | Status: DC

## 2016-01-16 MED FILL — LISINOPRIL 20 MG TABLET: 20 | 30 days supply | Qty: 30 | Fill #0

## 2016-01-16 MED FILL — metFORMIN HCL 500 MG TABS: 500 | 30 days supply | Qty: 60 | Fill #0

## 2016-01-16 NOTE — Progress Notes (Signed)
Patient ID: Rebecca [name], female   DOB: 1965-11-29, 50 y.o.   MRN: [id number]   Rebecca [name], is a 50 y.o. female  [id number]  [id number]  DOB - 01-Mar-1966  CC:  Chief Complaint  Patient presents with  . new patient/get established    has orange card, elevated blood pressure, diabetic, has pain with urination since menses, no birth control, has been married for 18 years no conception       HPI: Rebecca [name] is a 50 y.o. female here to establish care. She has a history of Type II diabetes, hyperlipidemia and arthritis.  Her only medication at this time is metformin 500 bid. She has been on Lopid but not currently. She is not on ACE/ARB for renal protection. She some times takes ibuprofen for knee pain.  Health maintenance: She reports being up to date on her tetanus, declines flu. Is in need of PAP and mammogram.  No Known Allergies Past Medical History  Diagnosis Date  . Diabetes mellitus   . Hyperlipidemia   . Arthritis    Current Outpatient Prescriptions on File Prior to Visit  Medication Sig Dispense Refill  . ibuprofen (ADVIL,MOTRIN) 200 MG tablet Take 400 mg by mouth daily as needed for mild pain.    Marland Kitchen acetaminophen (TYLENOL) 500 MG tablet Take 1,000 mg by mouth daily as needed for mild pain. Reported on 01/16/2016    . cephALEXin (KEFLEX) 500 MG capsule Take 1 capsule (500 mg total) by mouth 2 (two) times daily. (Patient not taking: Reported on 01/16/2016) 28 capsule 0   No current facility-administered medications on file prior to visit.   Family History  Problem Relation Age of Onset  . Diabetes Mother   . Heart disease Mother   . Diabetes Father   . Cancer Father 38    Throat  . Diabetes Sister   . Diabetes Brother    Social History   Social History  . Marital Status: Married    Spouse Name: N/A  . Number of Children: N/A  . Years of Education: N/A   Occupational History  . Not on file.   Social History Main Topics  . Smoking  status: Passive Smoke Exposure - Never Smoker  . Smokeless tobacco: Not on file  . Alcohol Use: No  . Drug Use: No  . Sexual Activity: Yes   Other Topics Concern  . Not on file   Social History Narrative    Review of Systems: Constitutional: Negative for fever, chills, appetite change, weight loss,  Fatigue. Skin: Negative for rashes or lesions of concern. HENT: Negative for ear pain, ear discharge.nose bleeds Eyes: Negative for pain, discharge, redness, itching and visual disturbance. Neck: Negative for pain, stiffness Respiratory: Negative for cough, shortness of breath,   Cardiovascular: Negative for chest pain, palpitations and leg swelling. Gastrointestinal: Negative for abdominal pain, nausea, vomiting, diarrhea, constipations Genitourinary: Negative for dysuria, urgency, frequency, hematuria,  Musculoskeletal: Positive  for back pain,, knee pain  joint  swelling, and gait problem.Negative for weakness. Neurological: Negative for dizziness, tremors, seizures, syncope,   light-headedness, numbness and headaches.  Hematological: Negative for easy bruising or bleeding Psychiatric/Behavioral: Negative for depression, anxiety, decreased concentration, confusion GYN" Positive for adnexal pain on occasionally. Negative for unusual discharge.   Objective:   Filed Vitals:   01/16/16 0924  BP: 147/100  Pulse: 101  Temp: 97.6 F (36.4 C)    Physical Exam: Constitutional: Patient appears well-developed and well-nourished. No distress. HENT: Normocephalic, atraumatic, External right and  left ear normal. Oropharynx is clear and moist.  Eyes: Conjunctivae and EOM are normal. PERRLA, no scleral icterus. Neck: Normal ROM. Neck supple. No lymphadenopathy, No thyromegaly. CVS: RRR, S1/S2 +, no murmurs, no gallops, no rubs Pulmonary: Effort and breath sounds normal, no stridor, rhonchi, wheezes, rales.  Abdominal: Soft. Normoactive BS,, no distension, tenderness, rebound or guarding.   Musculoskeletal: Normal range of motion. No edema and no tenderness.  Neuro: Alert.Normal muscle tone coordination. Non-focal Skin: Skin is warm and dry. No rash noted. Not diaphoretic. No erythema. No pallor. Psychiatric: Normal mood and affect. Behavior, judgment, thought content normal.  Lab Results  Component Value Date   WBC 6.2 08/06/2013   HGB 12.8 08/06/2013   HCT 37.8 08/06/2013   MCV 79.2 08/06/2013   PLT 271 08/06/2013   Lab Results  Component Value Date   CREATININE 0.52 08/06/2013   BUN 10 08/06/2013   NA 135 08/06/2013   K 3.9 08/06/2013   CL 101 08/06/2013   CO2 23 08/06/2013    Lab Results  Component Value Date   HGBA1C 7.3 08/06/2013   Lipid Panel     Component Value Date/Time   CHOL 258* 08/06/2013 0919   TRIG 668* 08/06/2013 0919   HDL 39* 08/06/2013 0919   CHOLHDL 6.6 08/06/2013 0919   VLDL NOT CALC 08/06/2013 0919   LDLCALC  08/06/2013 0919     Comment:       Not calculated due to Triglyceride >400. Suggest ordering Direct LDL (Unit Code: [phone]).   Total Cholesterol/HDL Ratio:CHD Risk                        Coronary Heart Disease Risk Table                                        Men       Women          1/2 Average Risk              3.4        3.3              Average Risk              5.0        4.4           2X Average Risk              9.6        7.1           3X Average Risk             23.4       11.0 Use the calculated Patient Ratio above and the CHD Risk table  to determine the patient's CHD Risk. ATP III Classification (LDL):       < 100        mg/dL         Optimal      100 - 129     mg/dL         Near or Above Optimal      130 - 159     mg/dL         Borderline High      160 - 189     mg/dL         High       >  190        mg/dL         Very High         Assessment and plan:   1. Health care maintenance  - TSH - Urinalysis Dipstick - HIV antibody (with reflex) -Recommend appoint for PAP.  2. Diabetes mellitus of  other type with complication (HCC)  - COMPLETE METABOLIC PANEL WITH GFR - CBC with Differential - Lipid panel - Microalbumin, urine - metFORMIN (GLUCOPHAGE) 500 MG tablet; Take 1 tablet (500 mg total) by mouth 2 (two) times daily with a meal.  Dispense: 180 tablet; Refill: 1  3. Adnexal pain  - Chlamydia/Gonococcus/Trichomonas, NAA  4. Essential hypertension -lisinopril 20 mg, #90, one po q day 1 Refill   6 months and prn  The patient was given clear instructions to go to ER or return to medical center if symptoms don't improve, worsen or new problems develop. The patient verbalized understanding.    Micheline Chapman FNP  01/16/2016, 10:04 AM

## 2016-01-16 NOTE — Patient Instructions (Signed)
Make an appointment to come back for PAP and BP check in 3 weeks. Will make a decision about medication for cholesterol when I receive labs. Remember to eat a diet low in sweets and other carb. Eat lots of vegetables fruits and lean meat that is not fried. Try to exercise regularly We will put in referral to orthopedist. Some one will call you about your appointment, if you get it. Call [phone] to make an appointment for mammogram Someone will call you about appointment with eye doctor.

## 2016-01-17 LAB — COMPLETE METABOLIC PANEL WITH GFR
ALT: 19 U/L (ref 6–29)
AST: 21 U/L (ref 10–35)
Albumin: 4.3 g/dL (ref 3.6–5.1)
Alkaline Phosphatase: 60 U/L (ref 33–115)
BUN: 10 mg/dL (ref 7–25)
CHLORIDE: 98 mmol/L (ref 98–110)
CO2: 21 mmol/L (ref 20–31)
Calcium: 9.5 mg/dL (ref 8.6–10.2)
Creat: 0.56 mg/dL (ref 0.50–1.10)
Glucose, Bld: 165 mg/dL — ABNORMAL HIGH (ref 65–99)
POTASSIUM: 4.1 mmol/L (ref 3.5–5.3)
Sodium: 136 mmol/L (ref 135–146)
Total Bilirubin: 0.4 mg/dL (ref 0.2–1.2)
Total Protein: 7.2 g/dL (ref 6.1–8.1)

## 2016-01-17 LAB — HEMOGLOBIN A1C
Hgb A1c MFr Bld: 8.4 % — ABNORMAL HIGH (ref <5.7)
Mean Plasma Glucose: 194 mg/dL

## 2016-01-17 LAB — GC/CHLAMYDIA PROBE AMP
CT PROBE, AMP APTIMA: NOT DETECTED
GC PROBE AMP APTIMA: NOT DETECTED

## 2016-01-17 LAB — MICROALBUMIN, URINE: Microalb, Ur: 0.9 mg/dL

## 2016-01-17 LAB — LIPID PANEL
CHOL/HDL RATIO: 5 ratio (ref <=5.0)
Cholesterol: 241 mg/dL — ABNORMAL HIGH (ref 125–200)
HDL: 48 mg/dL (ref >=46)
TRIGLYCERIDES: 512 mg/dL — AB (ref <150)

## 2016-01-17 LAB — TRICHOMONAS VAGINALIS, PROBE AMP: TRICHOMONAS VAGINALIS PROBE APTIMA: NEGATIVE

## 2016-01-17 LAB — TSH: TSH: 2.29 m[IU]/L

## 2016-01-17 LAB — HIV ANTIBODY (ROUTINE TESTING W REFLEX): HIV: NONREACTIVE

## 2016-01-18 ENCOUNTER — Other Ambulatory Visit: Payer: Self-pay

## 2016-01-18 DIAGNOSIS — Z1231 Encounter for screening mammogram for malignant neoplasm of breast: Secondary | ICD-10-CM

## 2016-01-23 ENCOUNTER — Other Ambulatory Visit: Payer: Self-pay | Admitting: Family Medicine

## 2016-01-23 MED ORDER — PRAVASTATIN SODIUM 40 MG PO TABS: 40.0000 mg | ORAL_TABLET | Freq: Every day | ORAL | Status: DC

## 2016-01-23 MED FILL — PRAVASTATIN NA 40 MG TAB: 40 | 30 days supply | Qty: 30 | Fill #0

## 2016-01-23 NOTE — Progress Notes (Signed)
LM to [facility] 

## 2016-01-25 ENCOUNTER — Telehealth: Payer: Self-pay

## 2016-01-25 NOTE — Telephone Encounter (Signed)
Rebecca [name], Patient called back and I made aware of labs and the need to take Metformin 1000mg  twice daily and need to start cholesterol medication as prescribed. Patient verbalized understanding.     [name],  Patient is requesting something for yeast infection since she has been on the antibiotic, she has now developed a yeast infection. She also is complaning that the bp medication is making her heart rate go up, can you have your nurse call her on Friday to address these issues? Also please change rx for metformin in the computer so when she calls in a refill we will have the correct dosage to send in. Please advise. Thanks!

## 2016-01-26 ENCOUNTER — Other Ambulatory Visit: Payer: Self-pay | Admitting: Family Medicine

## 2016-01-26 DIAGNOSIS — E138 Other specified diabetes mellitus with unspecified complications: Secondary | ICD-10-CM

## 2016-01-26 MED ORDER — METFORMIN HCL 500 MG PO TABS
1000.0000 mg | ORAL_TABLET | Freq: Two times a day (BID) | ORAL | Status: DC
Start: 2016-01-26 — End: 2016-11-02

## 2016-01-27 NOTE — Telephone Encounter (Signed)
Can get yeast medication over the counter like monostat. Talk with her about her pulse. Has she counted her pulse? How fast does it get. How long does it stay fast?

## 2016-01-30 NOTE — Telephone Encounter (Signed)
Spoke with patient and advised her to get Monistat OTC for yeast infection per NP, patient has appointment to discuss heart rate. No new concerns voiced.

## 2016-02-07 ENCOUNTER — Other Ambulatory Visit: Payer: No Typology Code available for payment source

## 2016-02-08 ENCOUNTER — Other Ambulatory Visit: Payer: No Typology Code available for payment source

## 2016-02-09 ENCOUNTER — Ambulatory Visit
Admission: RE | Admit: 2016-02-09 | Discharge: 2016-02-09 | Disposition: A | Discharge status: Home / Self Care | Payer: No Typology Code available for payment source | Source: Ambulatory Visit

## 2016-02-09 DIAGNOSIS — Z1231 Encounter for screening mammogram for malignant neoplasm of breast: Secondary | ICD-10-CM

## 2016-02-09 IMAGING — MG MM DIGITAL SCREENING BILAT
5 series · 5 of 5 positions shown · non-contrast
Comparison: None.

CLINICAL DATA: Screening.

EXAM:
DIGITAL SCREENING BILATERAL MAMMOGRAM WITH CAD

[L CC]
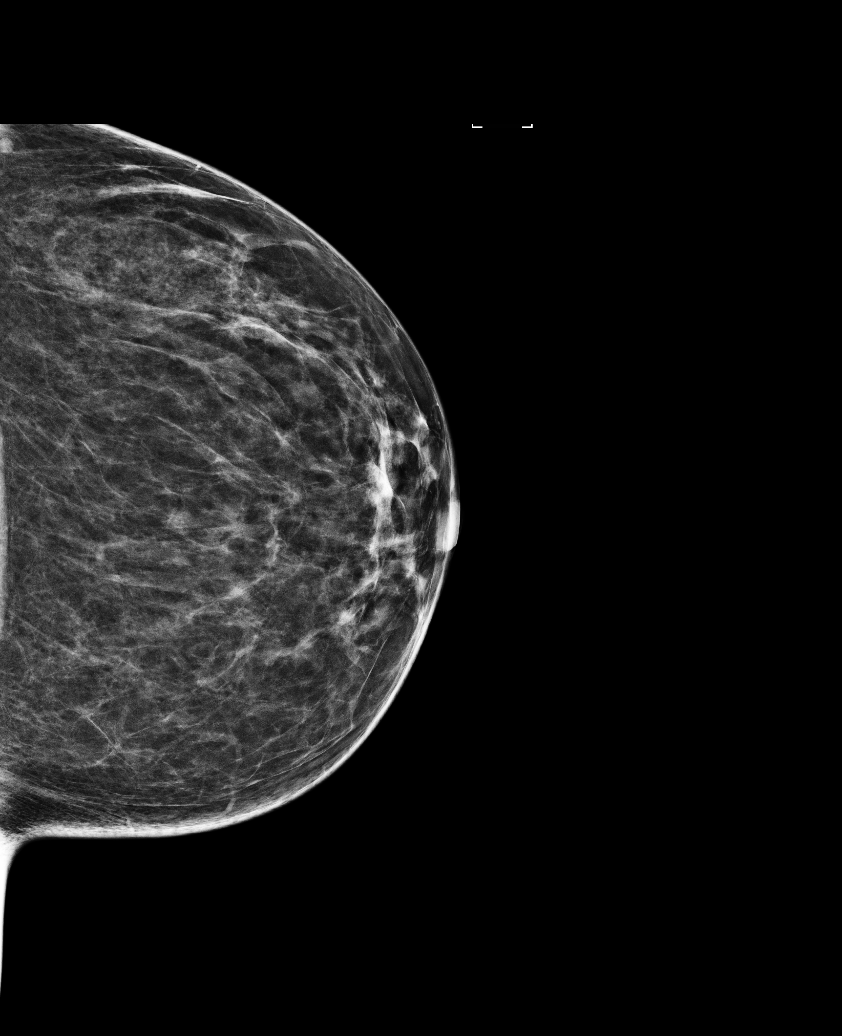

[L MLO (1 of 2)]
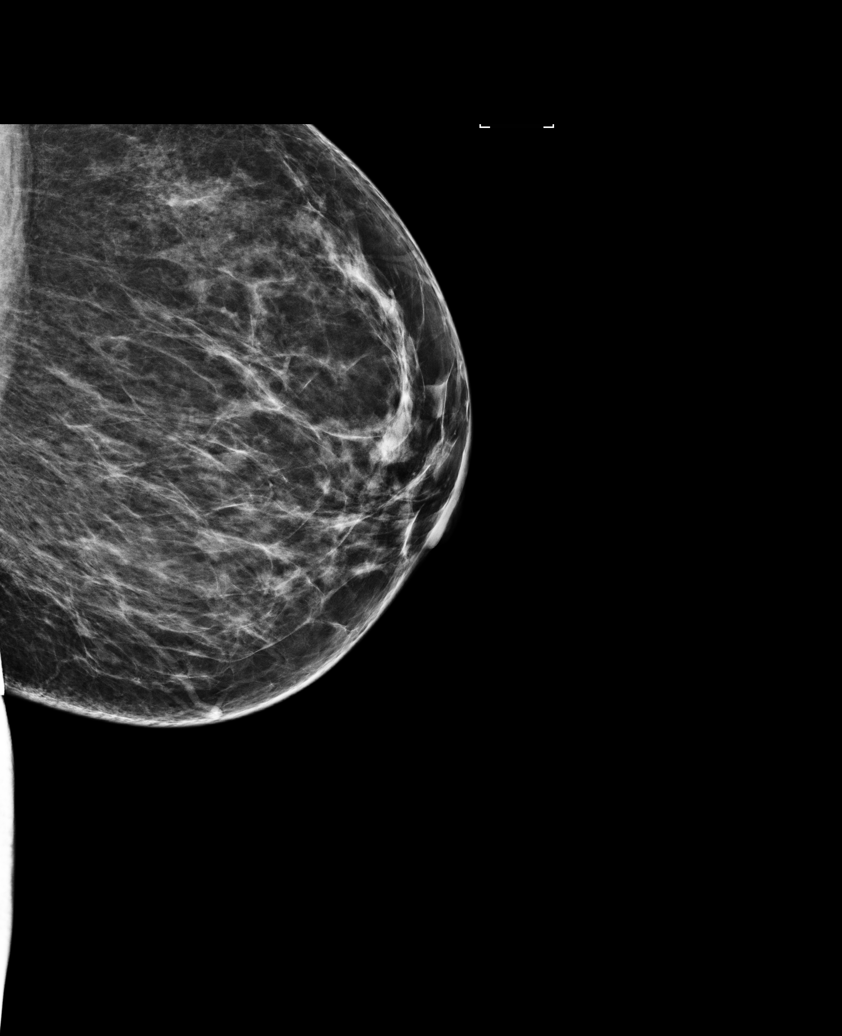

[L MLO (2 of 2)]
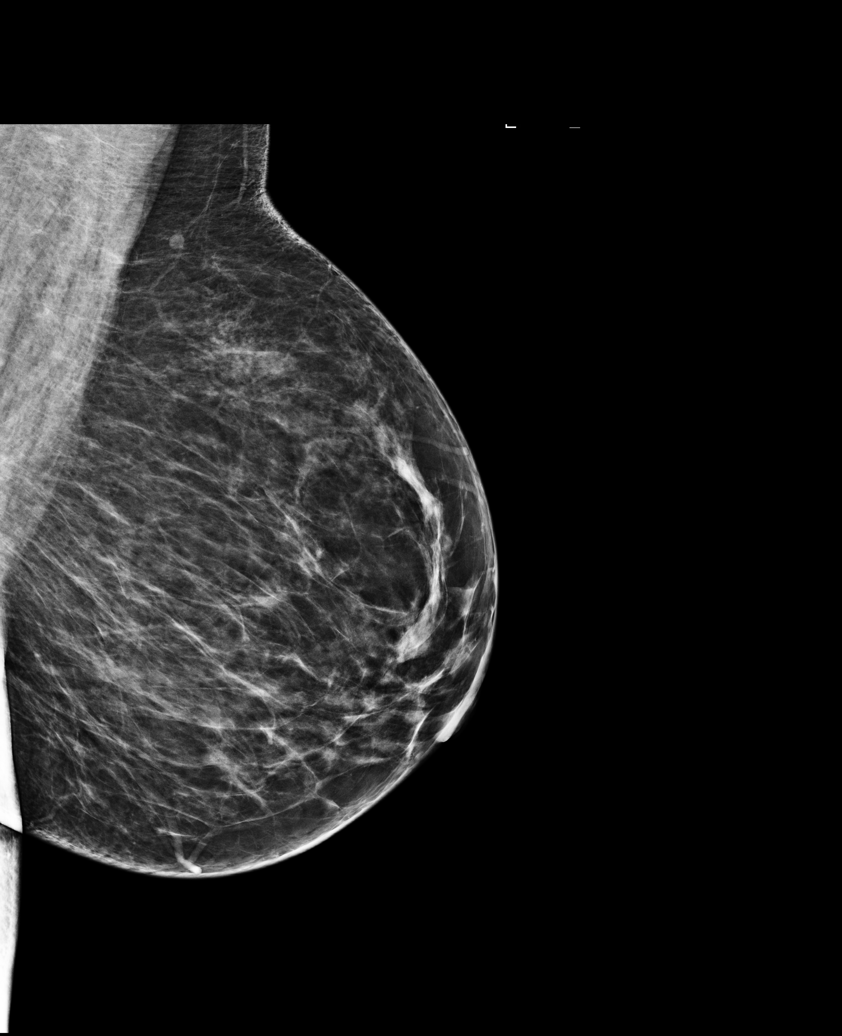

[R MLO]
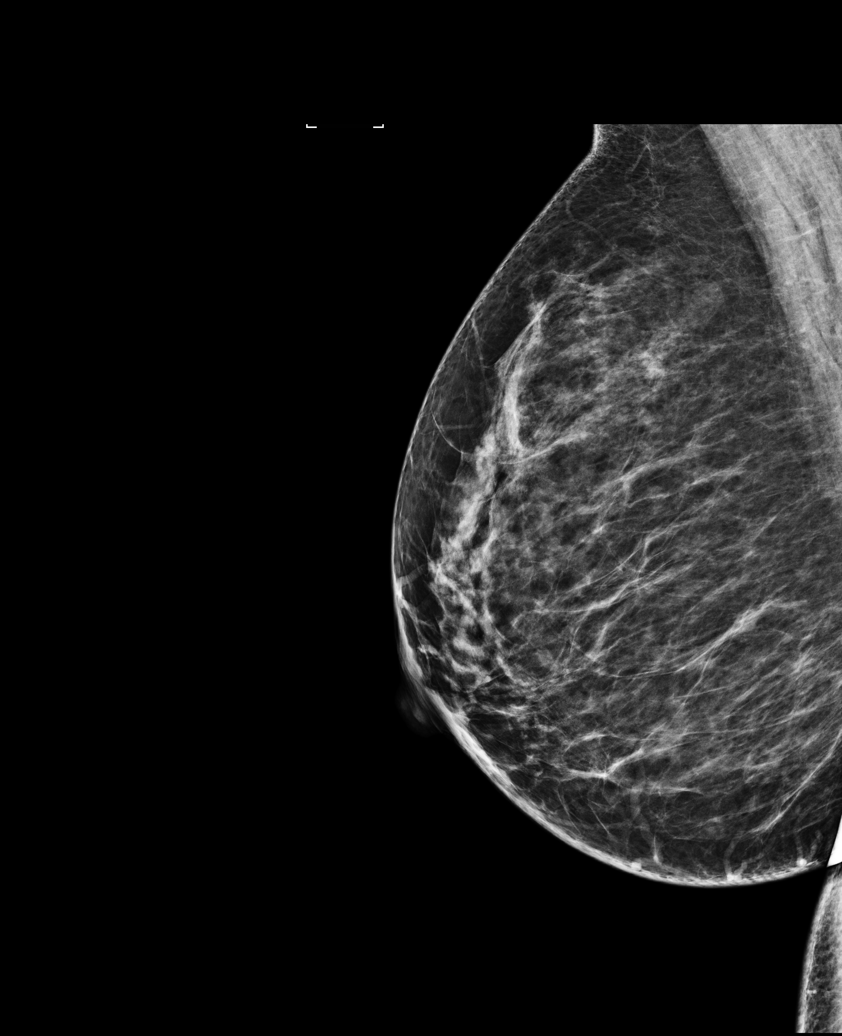

[R CC]
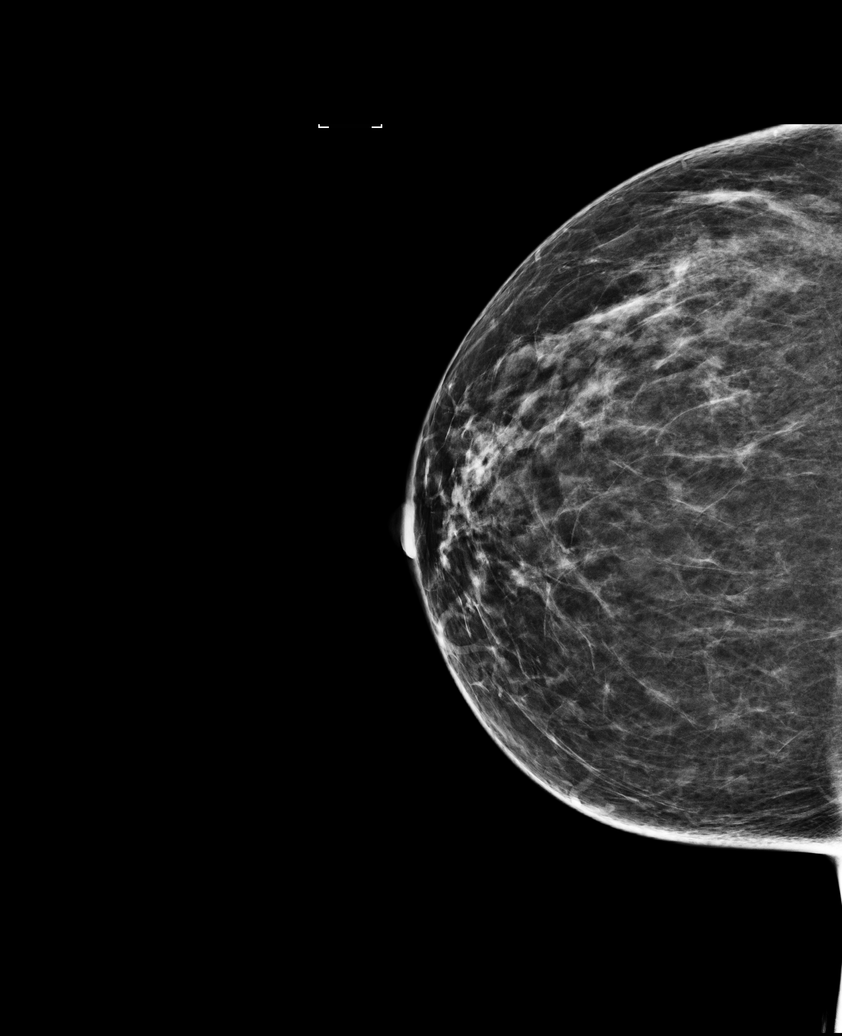

[5 of 5 positions shown; findings below may reference images not displayed]

ACR Breast Density Category b: There are scattered areas of
fibroglandular density.
FINDINGS: In the left breast, a possible mass warrants further evaluation. In
the right breast, no findings suspicious for malignancy.

Images were processed with CAD.
IMPRESSION: Further evaluation is suggested for possible mass in the left
breast.

RECOMMENDATION:
Diagnostic mammogram and possibly ultrasound of the left breast.
(Code:[GA])

The patient will be contacted regarding the findings, and additional
imaging will be scheduled.

BI-RADS CATEGORY  0: Incomplete. Need additional imaging evaluation
and/or prior mammograms for comparison.

## 2016-02-10 ENCOUNTER — Ambulatory Visit (INDEPENDENT_AMBULATORY_CARE_PROVIDER_SITE_OTHER): Payer: No Typology Code available for payment source | Admitting: Family Medicine

## 2016-02-10 ENCOUNTER — Encounter: Payer: Self-pay | Admitting: Family Medicine

## 2016-02-10 VITALS — BP 127/80 | HR 78 | Temp 98.2°F | Resp 18 | Wt 152.0 lb

## 2016-02-10 DIAGNOSIS — B3731 Acute candidiasis of vulva and vagina: Secondary | ICD-10-CM

## 2016-02-10 DIAGNOSIS — R05 Cough: Secondary | ICD-10-CM

## 2016-02-10 DIAGNOSIS — R059 Cough, unspecified: Secondary | ICD-10-CM

## 2016-02-10 DIAGNOSIS — B373 Candidiasis of vulva and vagina: Secondary | ICD-10-CM

## 2016-02-10 MED ORDER — CETIRIZINE HCL 10 MG PO TABS: 10.0000 mg | ORAL_TABLET | Freq: Every day | ORAL | Status: DC

## 2016-02-10 MED ORDER — NYSTATIN-TRIAMCINOLONE 100000-0.1 UNIT/GM-% EX OINT: 1.0000 "application " | TOPICAL_OINTMENT | Freq: Two times a day (BID) | CUTANEOUS | Status: DC

## 2016-02-10 MED FILL — metFORMIN HCL 500 MG TABS: 500 | 30 days supply | Qty: 60 | Fill #1

## 2016-02-10 MED FILL — ?CETIRIZINE HCL 10 MG TABLE: 10 | 30 days supply | Qty: 30 | Fill #0

## 2016-02-10 NOTE — Patient Instructions (Signed)
Take zyrtec once a day for throat itching and cough. Have prescribed nystatin for vaginal itching.

## 2016-02-10 NOTE — Progress Notes (Signed)
Patient ID: Rebecca [name], female   DOB: 04/02/1966, 49 y.o.   MRN: [id number]   Rebecca [name], is a 50 y.o. female  [id number]  [id number]  DOB - 1966/07/23  CC:  Chief Complaint  Patient presents with  . Cough    with phlegm       HPI: Rebecca [name] is a 50 y.o. female here complaining of itchy throat with cough for last couple of weeks.  She denies itchy, runny nose, itchy eyes, or skin rash. She also c/o vaginal discharge and itching. She has been using some old nystatin and request a  Refill.  She has a history of occassional yeast infections. She was here less than a month ago for review of chronic conditions, so will only address current complaints today. No Known Allergies Past Medical History  Diagnosis Date  . Diabetes mellitus   . Hyperlipidemia   . Arthritis    Current Outpatient Prescriptions on File Prior to Visit  Medication Sig Dispense Refill  . acetaminophen (TYLENOL) 500 MG tablet Take 1,000 mg by mouth daily as needed for mild pain. Reported on 01/16/2016    . ibuprofen (ADVIL,MOTRIN) 200 MG tablet Take 400 mg by mouth daily as needed for mild pain.    . metFORMIN (GLUCOPHAGE) 500 MG tablet Take 2 tablets (1,000 mg total) by mouth 2 (two) times daily with a meal. 180 tablet 1  . pravastatin (PRAVACHOL) 40 MG tablet Take 1 tablet (40 mg total) by mouth daily. 90 tablet 1  . cephALEXin (KEFLEX) 500 MG capsule Take 1 capsule (500 mg total) by mouth 2 (two) times daily. (Patient not taking: Reported on 01/16/2016) 28 capsule 0  . lisinopril (PRINIVIL,ZESTRIL) 20 MG tablet Take 1 tablet (20 mg total) by mouth daily. (Patient not taking: Reported on 02/10/2016) 90 tablet 1   No current facility-administered medications on file prior to visit.   Family History  Problem Relation Age of Onset  . Diabetes Mother   . Heart disease Mother   . Diabetes Father   . Cancer Father 36    Throat  . Diabetes Sister   . Diabetes Brother    Social  History   Social History  . Marital Status: Married    Spouse Name: N/A  . Number of Children: N/A  . Years of Education: N/A   Occupational History  . Not on file.   Social History Main Topics  . Smoking status: Passive Smoke Exposure - Never Smoker  . Smokeless tobacco: Not on file  . Alcohol Use: No  . Drug Use: No  . Sexual Activity: Yes   Other Topics Concern  . Not on file   Social History Narrative    Review of Systems: Constitutional: Negative for fever, chills, appetite change, weight loss,  Fatigue. Skin: Negative for rashes or lesions of concern. HENT: Negative for ear pain, ear discharge.nose bleeds. Positive for itching throat Eyes: Negative for pain, discharge, redness, itching and visual disturbance. Neck: Negative for pain, stiffness Respiratory: Positive for cough, shortness of breath,   Cardiovascular: Negative for chest pain, palpitations and leg swelling. Gastrointestinal: Negative for abdominal pain, nausea, vomiting, diarrhea, constipations Genitourinary: Negative for dysuria, urgency, frequency, hematuria,  Musculoskeletal: Negative for back pain, joint pain, joint  swelling, and gait problem.Negative for weakness. Neurological: Negative for dizziness, tremors, seizures, syncope,   light-headedness, numbness and headaches.  Hematological: Negative for easy bruising or bleeding Psychiatric/Behavioral: Negative for depression, anxiety, decreased concentration, confusion GU: Positive for vaginal discharge and intense  itching.  Objective:   Filed Vitals:   02/10/16 0942  BP: 127/80  Pulse: 78  Temp: 98.2 F (36.8 C)  Resp: 18    Physical Exam: Constitutional: Patient appears well-developed and well-nourished. No distress. HENT: Normocephalic, atraumatic, External right and left ear normal. Oropharynx is clear and moist.  Eyes: Conjunctivae and EOM are normal. PERRLA, no scleral icterus. Neck: Normal ROM. Neck supple. No lymphadenopathy, No  thyromegaly. CVS: RRR, S1/S2 +, no murmurs, no gallops, no rubs Pulmonary: Effort and breath sounds normal, no stridor, rhonchi, wheezes, rales.  Abdominal: Soft. Normoactive BS,, no distension, tenderness, rebound or guarding.  Musculoskeletal: Normal range of motion. No edema and no tenderness.  Neuro: Alert.Normal muscle tone coordination. Non-focal Skin: Skin is warm and dry. No rash noted. Not diaphoretic. No erythema. No pallor. Psychiatric: Normal mood and affect. Behavior, judgment, thought content normal.  Lab Results  Component Value Date   WBC 7.2 01/16/2016   HGB 13.0 01/16/2016   HCT 39.6 01/16/2016   MCV 79.5 01/16/2016   PLT 318 01/16/2016   Lab Results  Component Value Date   CREATININE 0.56 01/16/2016   BUN 10 01/16/2016   NA 136 01/16/2016   K 4.1 01/16/2016   CL 98 01/16/2016   CO2 21 01/16/2016    Lab Results  Component Value Date   HGBA1C 8.4* 01/16/2016   Lipid Panel     Component Value Date/Time   CHOL 241* 01/16/2016 1006   TRIG 512* 01/16/2016 1006   HDL 48 01/16/2016 1006   CHOLHDL 5.0 01/16/2016 1006   VLDL NOT CALC 01/16/2016 1006   LDLCALC NOT CALC 01/16/2016 1006       Assessment and plan:   1. Cough - cetirizine (ZYRTEC) 10 MG tablet; Take 1 tablet (10 mg total) by mouth daily.  Dispense: 30 tablet; Refill: 11  2. Vaginal yeast infection - nystatin-triamcinolone ointment (MYCOLOG); Apply 1 application topically 2 (two) times daily.  Dispense: 30 g; Refill: 0   Return if symptoms worsen or fail to improve.  The patient was given clear instructions to go to ER or return to medical center if symptoms don't improve, worsen or new problems develop. The patient verbalized understanding.    Micheline Chapman FNP  02/10/2016, 11:47 AM

## 2016-02-13 ENCOUNTER — Other Ambulatory Visit: Payer: Self-pay | Admitting: Family Medicine

## 2016-02-13 DIAGNOSIS — R928 Other abnormal and inconclusive findings on diagnostic imaging of breast: Secondary | ICD-10-CM

## 2016-02-17 ENCOUNTER — Other Ambulatory Visit: Payer: Self-pay | Admitting: Family Medicine

## 2016-02-17 ENCOUNTER — Other Ambulatory Visit: Payer: Self-pay

## 2016-02-17 DIAGNOSIS — R928 Other abnormal and inconclusive findings on diagnostic imaging of breast: Secondary | ICD-10-CM

## 2016-02-20 ENCOUNTER — Ambulatory Visit
Admission: RE | Admit: 2016-02-20 | Discharge: 2016-02-20 | Disposition: A | Discharge status: Home / Self Care | Payer: No Typology Code available for payment source | Source: Ambulatory Visit | Attending: Family Medicine | Admitting: Family Medicine

## 2016-02-20 DIAGNOSIS — R928 Other abnormal and inconclusive findings on diagnostic imaging of breast: Secondary | ICD-10-CM

## 2016-02-20 IMAGING — MG MM DIAG BREAST TOMO UNI LEFT
6 series · 6 of 14 positions shown · non-contrast
Comparison: [DATE]

CLINICAL DATA: Possible mass left breast on recent baseline
screening mammogram.

EXAM:
2D DIGITAL DIAGNOSTIC LEFT MAMMOGRAM WITH CAD AND ADJUNCT TOMO
ULTRASOUND LEFT BREAST

[L MLO synth-2D]
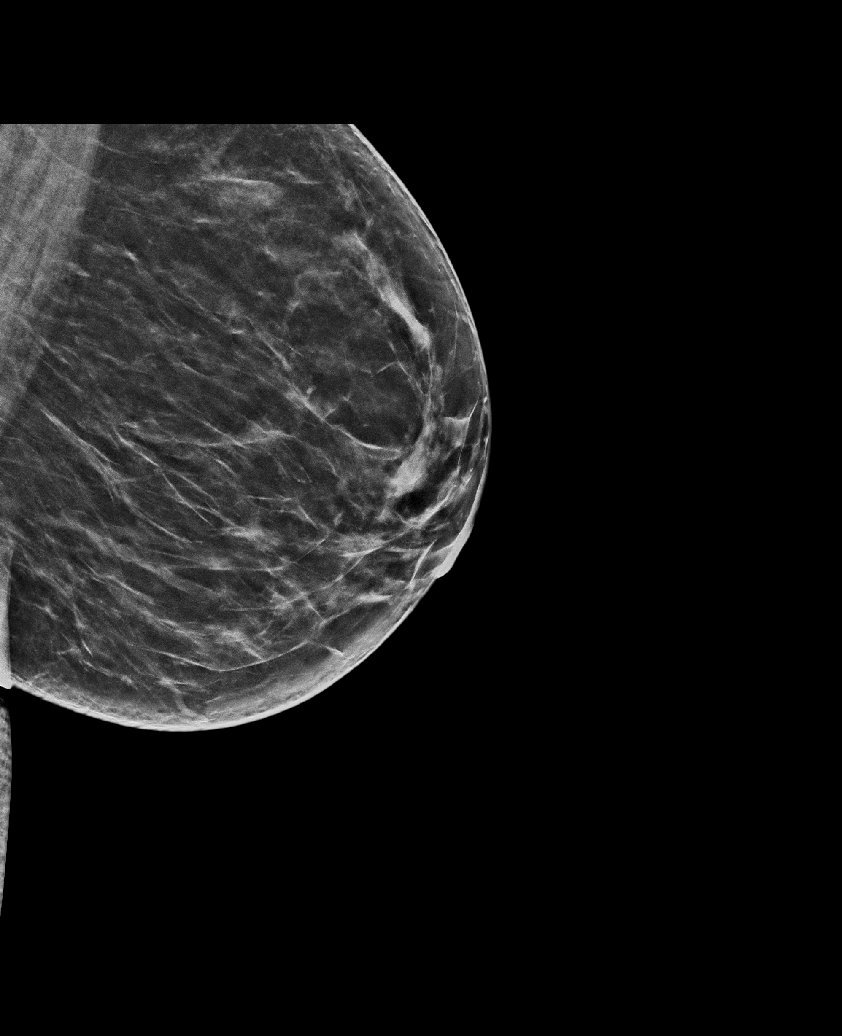

[L MLO]
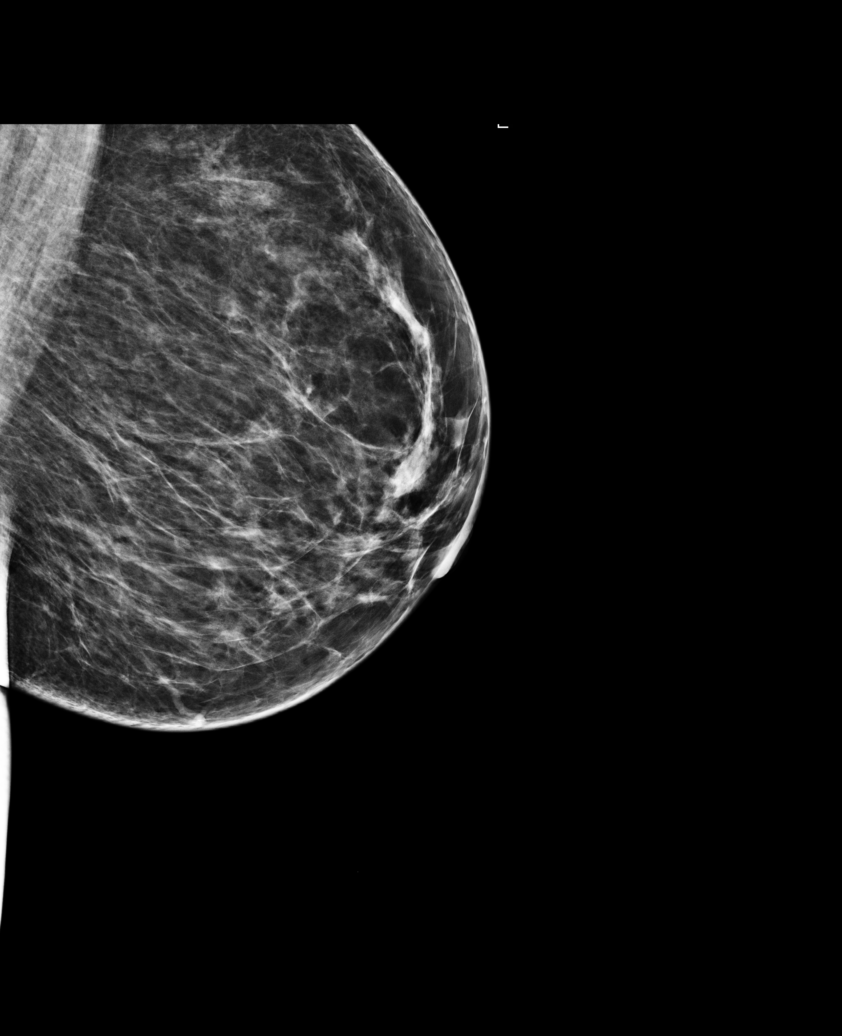

[L XCCL]
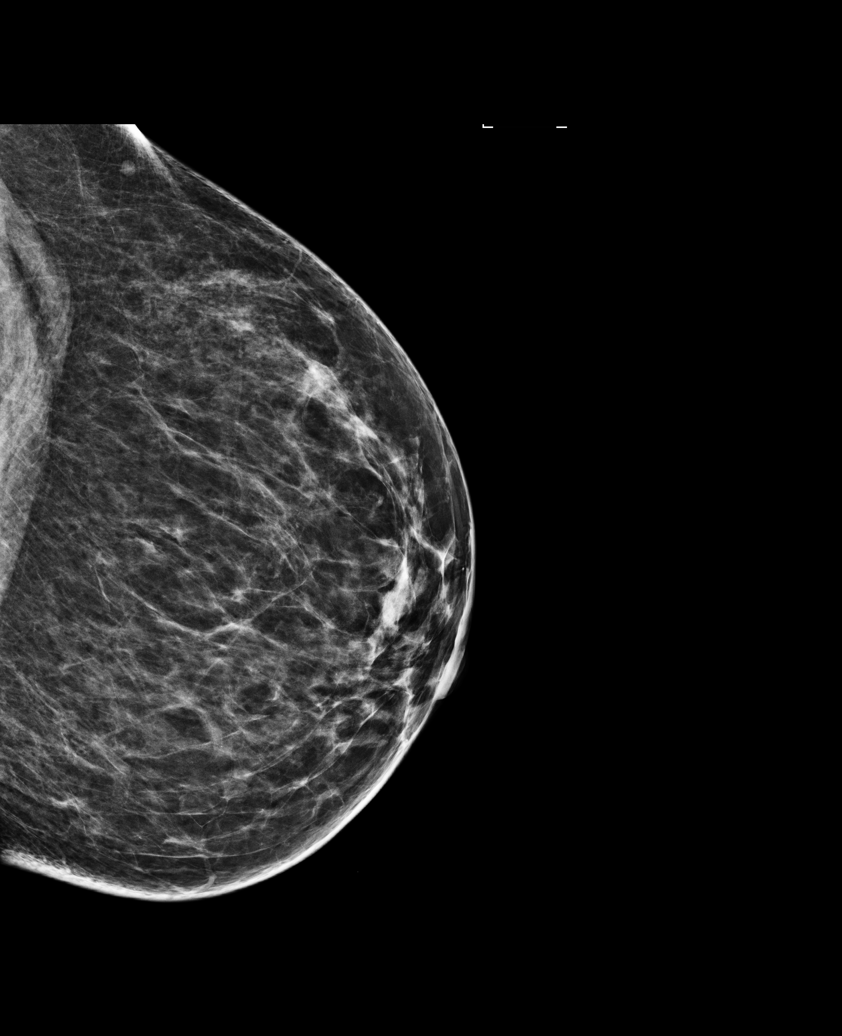

[L XCCL synth-2D]
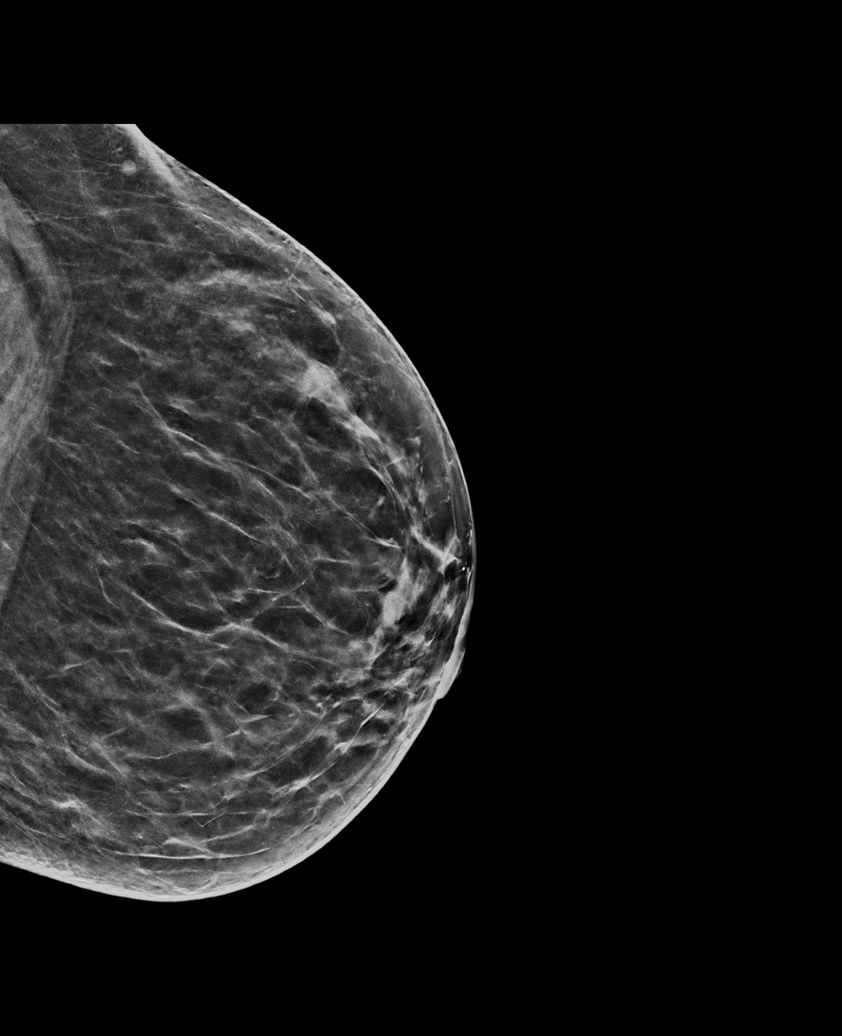

[L XCCL tomo · tomo slice 33/64.0]
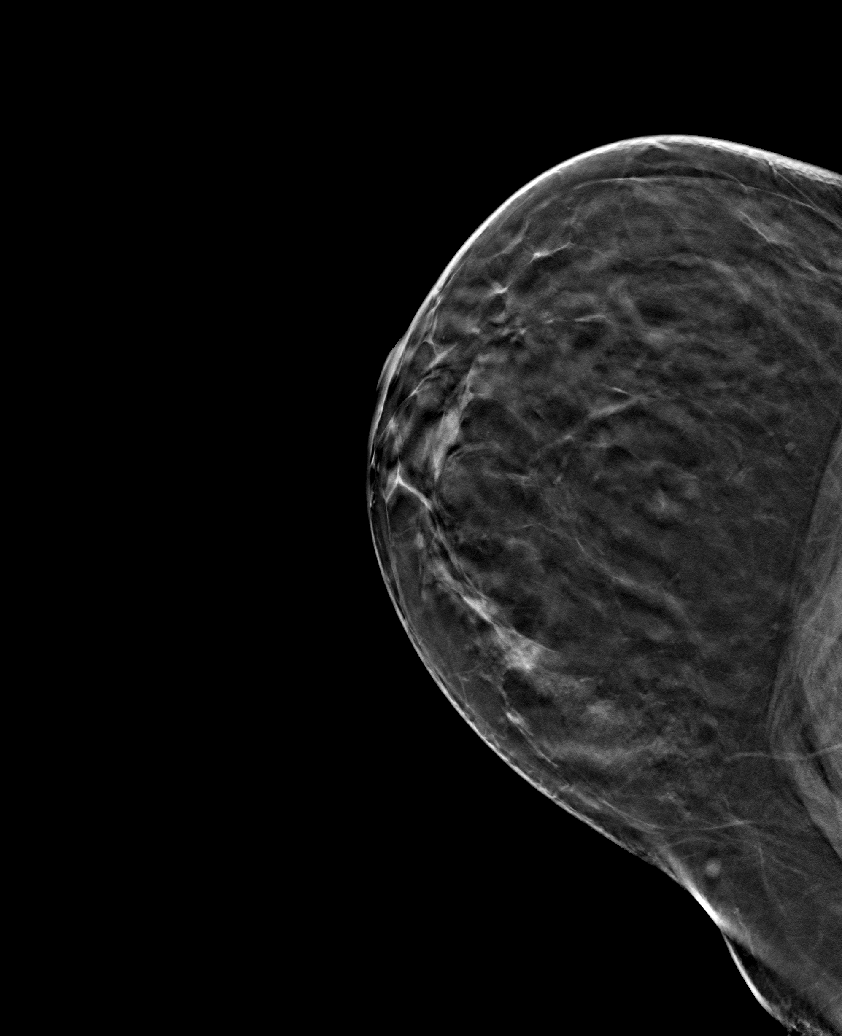

[L MLO tomo · tomo slice 34/67.0]
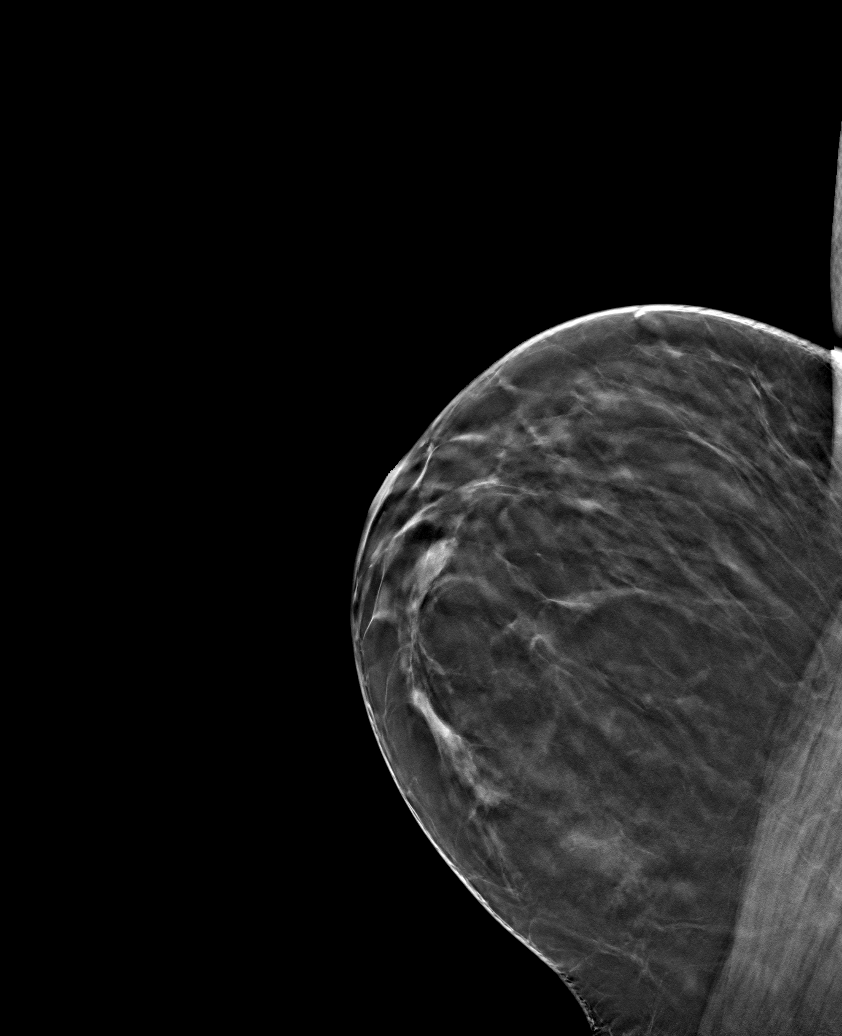

[6 of 14 positions shown; findings below may reference images not displayed]

ACR Breast Density Category b: There are scattered areas of
fibroglandular density.
FINDINGS: 3D tomographic images of the left breast in the MLO and exaggerated
CC lateral projections are obtained. There is a circumscribed 3 mm
nodule in the axillary tail of the left breast with a probable fatty
hilum. This is favored to be an axillary tail intramammary lymph
node.

Mammographic images were processed with CAD.

Targeted ultrasound is performed, showing a circumscribed oval
hypoechoic nodule measuring 4 x 2 x 4 mm. There is central
echogenicity with vascular flow. Findings are consistent with an
intramammary lymph node. This is at [DATE] position 12 cm from the
nipple.
IMPRESSION: Benign intramammary lymph node axillary tail left breast. No
evidence of malignancy.

RECOMMENDATION:
Screening mammogram in one year.(Code:[H3])

I have discussed the findings and recommendations with the patient.
Results were also provided in writing at the conclusion of the
visit. If applicable, a reminder letter will be sent to the patient
regarding the next appointment.

BI-RADS CATEGORY  2: Benign.

## 2016-03-20 MED FILL — PRAVASTATIN NA 40 MG TAB: 40 | 30 days supply | Qty: 30 | Fill #1

## 2016-03-20 MED FILL — NYSTATIN-TRIAMCINOLONE OINT: 100000-0.1 | 15 days supply | Qty: 30 | Fill #0

## 2016-03-20 MED FILL — metFORMIN HCL 500 MG TABS: 500 | 30 days supply | Qty: 60 | Fill #2

## 2016-03-20 MED FILL — ?CETIRIZINE HCL 10 MG TABLE: 10 | 30 days supply | Qty: 30 | Fill #1

## 2016-06-15 ENCOUNTER — Ambulatory Visit: Payer: No Typology Code available for payment source | Admitting: Family Medicine

## 2016-11-02 ENCOUNTER — Ambulatory Visit (HOSPITAL_COMMUNITY)
Admission: RE | Admit: 2016-11-02 | Discharge: 2016-11-02 | Disposition: A | Discharge status: Home / Self Care | Payer: BLUE CROSS/BLUE SHIELD | Source: Ambulatory Visit | Attending: Family Medicine | Admitting: Family Medicine

## 2016-11-02 ENCOUNTER — Encounter: Payer: Self-pay | Admitting: Family Medicine

## 2016-11-02 ENCOUNTER — Ambulatory Visit: Payer: BLUE CROSS/BLUE SHIELD | Attending: Family Medicine | Admitting: Family Medicine

## 2016-11-02 VITALS — BP 130/84 | HR 92 | Temp 97.7°F | Resp 18 | Ht 63.0 in | Wt 156.8 lb

## 2016-11-02 DIAGNOSIS — E119 Type 2 diabetes mellitus without complications: Secondary | ICD-10-CM | POA: Insufficient documentation

## 2016-11-02 DIAGNOSIS — E118 Type 2 diabetes mellitus with unspecified complications: Secondary | ICD-10-CM

## 2016-11-02 DIAGNOSIS — M25562 Pain in left knee: Secondary | ICD-10-CM

## 2016-11-02 DIAGNOSIS — M2010 Hallux valgus (acquired), unspecified foot: Secondary | ICD-10-CM | POA: Insufficient documentation

## 2016-11-02 DIAGNOSIS — Z9181 History of falling: Secondary | ICD-10-CM | POA: Diagnosis present

## 2016-11-02 DIAGNOSIS — M19072 Primary osteoarthritis, left ankle and foot: Secondary | ICD-10-CM | POA: Diagnosis not present

## 2016-11-02 DIAGNOSIS — M25561 Pain in right knee: Secondary | ICD-10-CM | POA: Insufficient documentation

## 2016-11-02 DIAGNOSIS — M25471 Effusion, right ankle: Secondary | ICD-10-CM | POA: Insufficient documentation

## 2016-11-02 DIAGNOSIS — Z79899 Other long term (current) drug therapy: Secondary | ICD-10-CM | POA: Diagnosis not present

## 2016-11-02 DIAGNOSIS — I10 Essential (primary) hypertension: Secondary | ICD-10-CM

## 2016-11-02 DIAGNOSIS — M25571 Pain in right ankle and joints of right foot: Secondary | ICD-10-CM | POA: Diagnosis not present

## 2016-11-02 DIAGNOSIS — M170 Bilateral primary osteoarthritis of knee: Secondary | ICD-10-CM | POA: Insufficient documentation

## 2016-11-02 DIAGNOSIS — M79672 Pain in left foot: Secondary | ICD-10-CM

## 2016-11-02 DIAGNOSIS — Z7984 Long term (current) use of oral hypoglycemic drugs: Secondary | ICD-10-CM | POA: Diagnosis not present

## 2016-11-02 DIAGNOSIS — E785 Hyperlipidemia, unspecified: Secondary | ICD-10-CM

## 2016-11-02 LAB — BASIC METABOLIC PANEL
BUN: 13 mg/dL (ref 7–25)
CO2: 26 mmol/L (ref 20–31)
Calcium: 9 mg/dL (ref 8.6–10.2)
Chloride: 101 mmol/L (ref 98–110)
Creat: 0.5 mg/dL (ref 0.50–1.10)
Glucose, Bld: 190 mg/dL — ABNORMAL HIGH (ref 65–99)
POTASSIUM: 4.1 mmol/L (ref 3.5–5.3)
Sodium: 136 mmol/L (ref 135–146)

## 2016-11-02 LAB — GLUCOSE, POCT (MANUAL RESULT ENTRY): POC GLUCOSE: 166 mg/dL — AB (ref 70–99)

## 2016-11-02 LAB — LIPID PANEL
CHOLESTEROL: 263 mg/dL — AB (ref <200)
HDL: 48 mg/dL — ABNORMAL LOW (ref >50)
LDL Cholesterol: 135 mg/dL — ABNORMAL HIGH (ref <100)
TRIGLYCERIDES: 399 mg/dL — AB (ref <150)
Total CHOL/HDL Ratio: 5.5 Ratio — ABNORMAL HIGH (ref <5.0)
VLDL: 80 mg/dL — ABNORMAL HIGH (ref <30)

## 2016-11-02 LAB — POCT GLYCOSYLATED HEMOGLOBIN (HGB A1C): Hemoglobin A1C: 9.5

## 2016-11-02 IMAGING — DX DG FOOT COMPLETE 3+V*L*
3 series · 3 of 3 positions shown · non-contrast
Comparison: None

CLINICAL DATA: Recent fall.  Left foot pain

EXAM:
LEFT FOOT - COMPLETE 3+ VIEW

[foot ap]
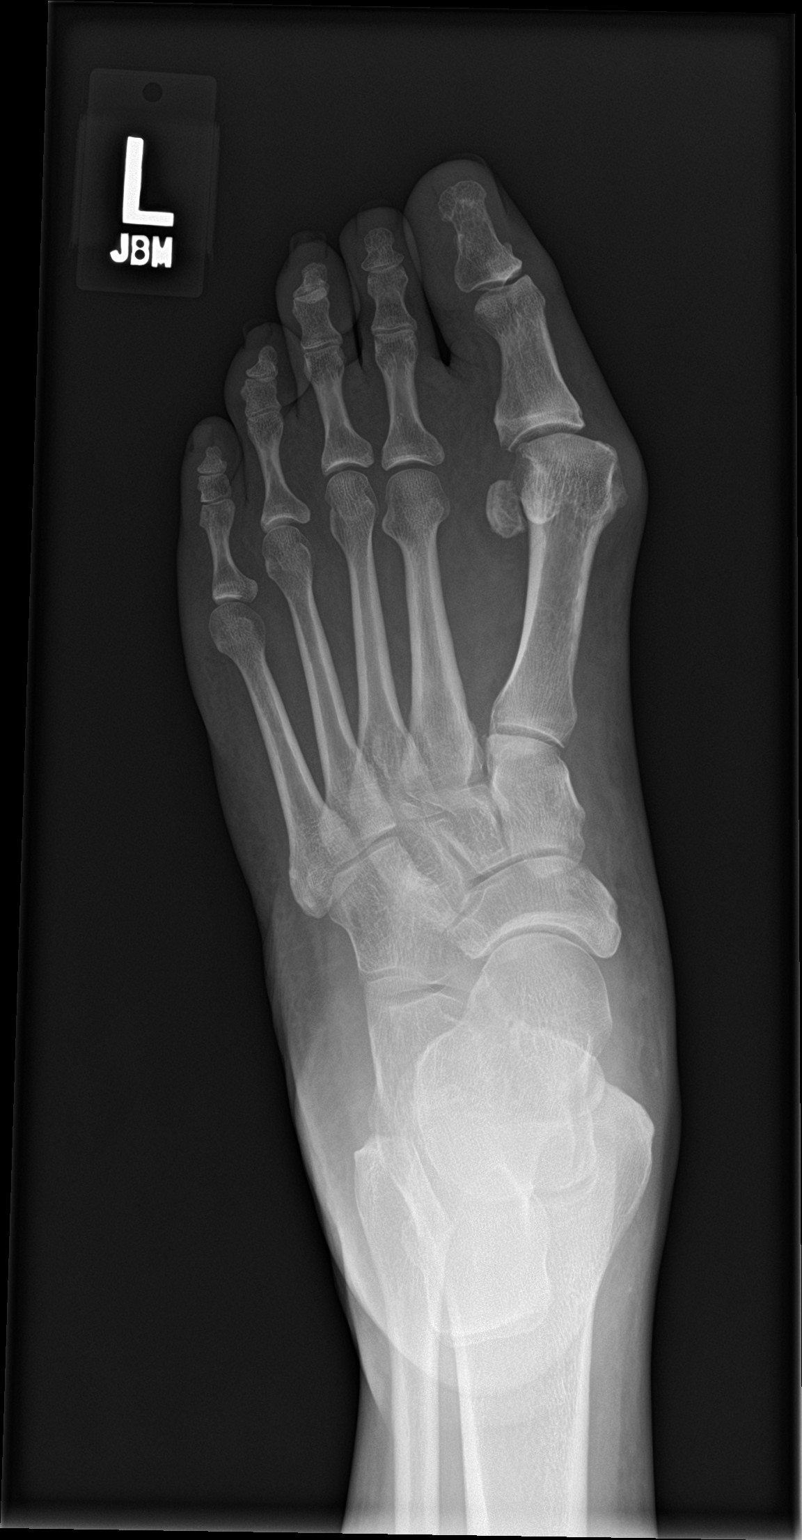

[foot obl]
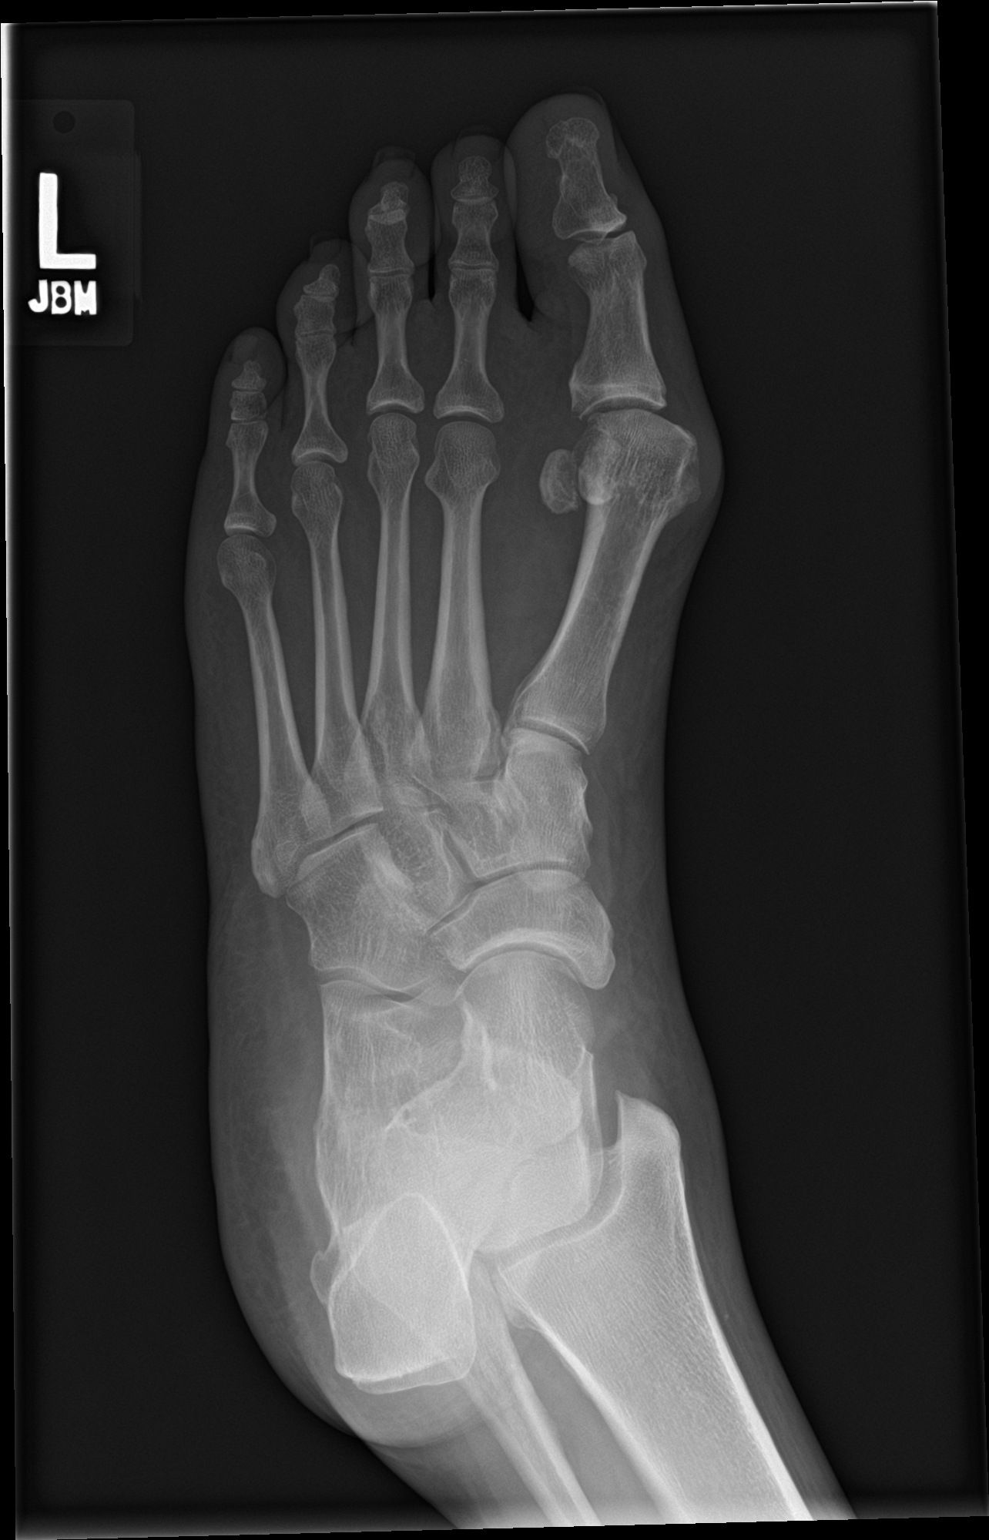

[foot lat]
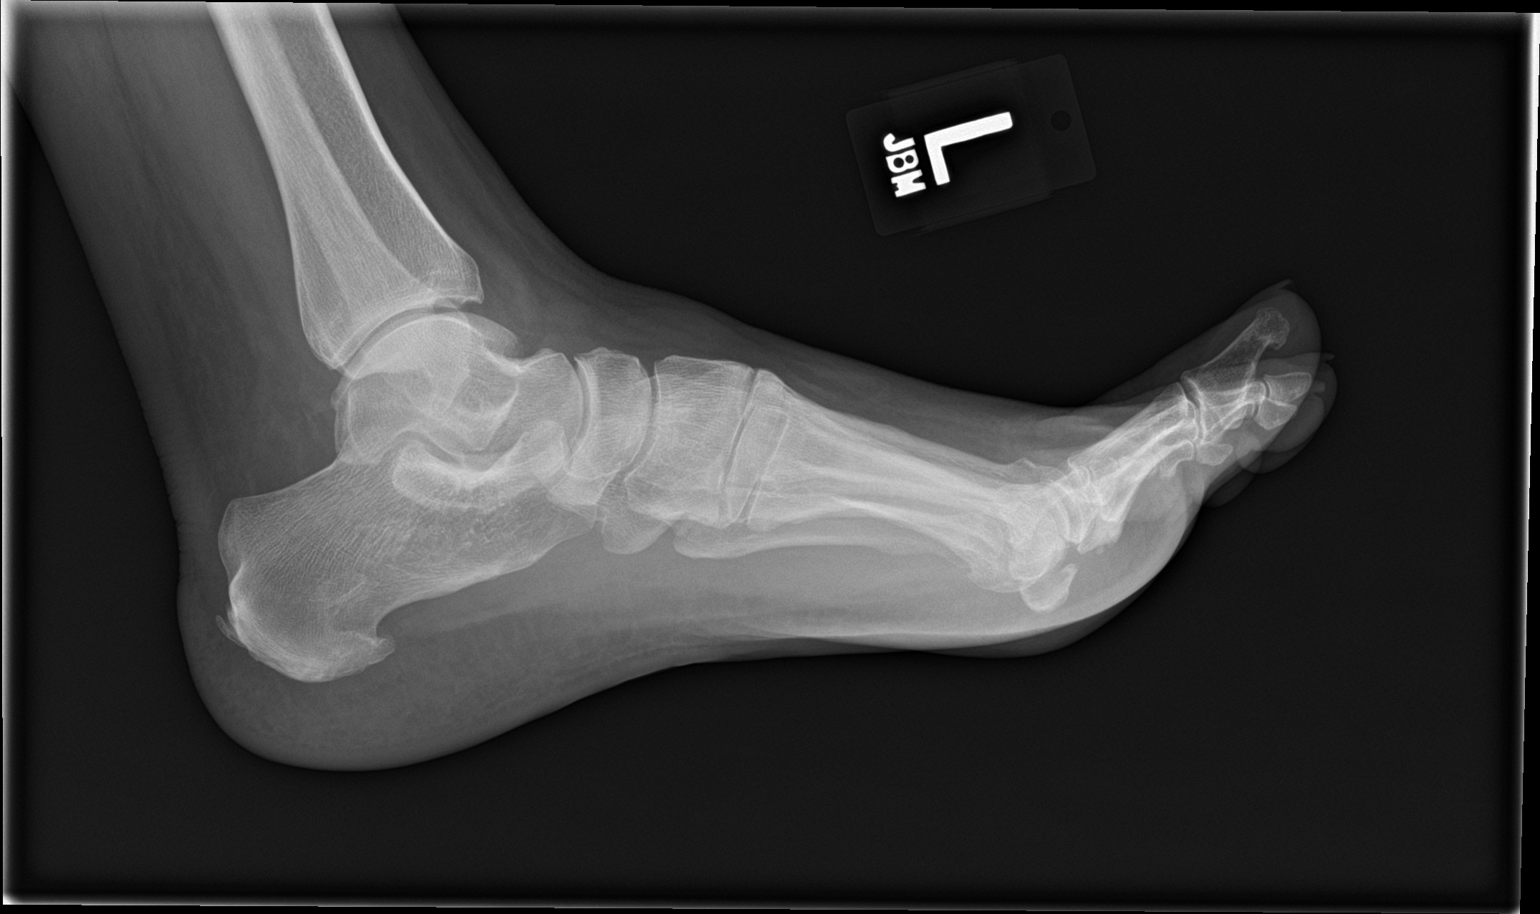

[3 of 3 positions shown; findings below may reference images not displayed]

FINDINGS: Mild degenerative changes and hallux valgus deformity at the first
MTP joint. No acute bony abnormality. Specifically, no fracture,
subluxation, or dislocation. Soft tissues are intact. Plantar
calcaneal spur.
IMPRESSION: Hallux valgus.  No acute bony abnormality.

## 2016-11-02 IMAGING — DX DG KNEE COMPLETE 4+V*R*
4 series · 4 of 4 positions shown · non-contrast
Comparison: None.

CLINICAL DATA: Acute bilateral knee pain.  Recent fall.

EXAM:
RIGHT KNEE - COMPLETE 4+ VIEW

[knee ap]
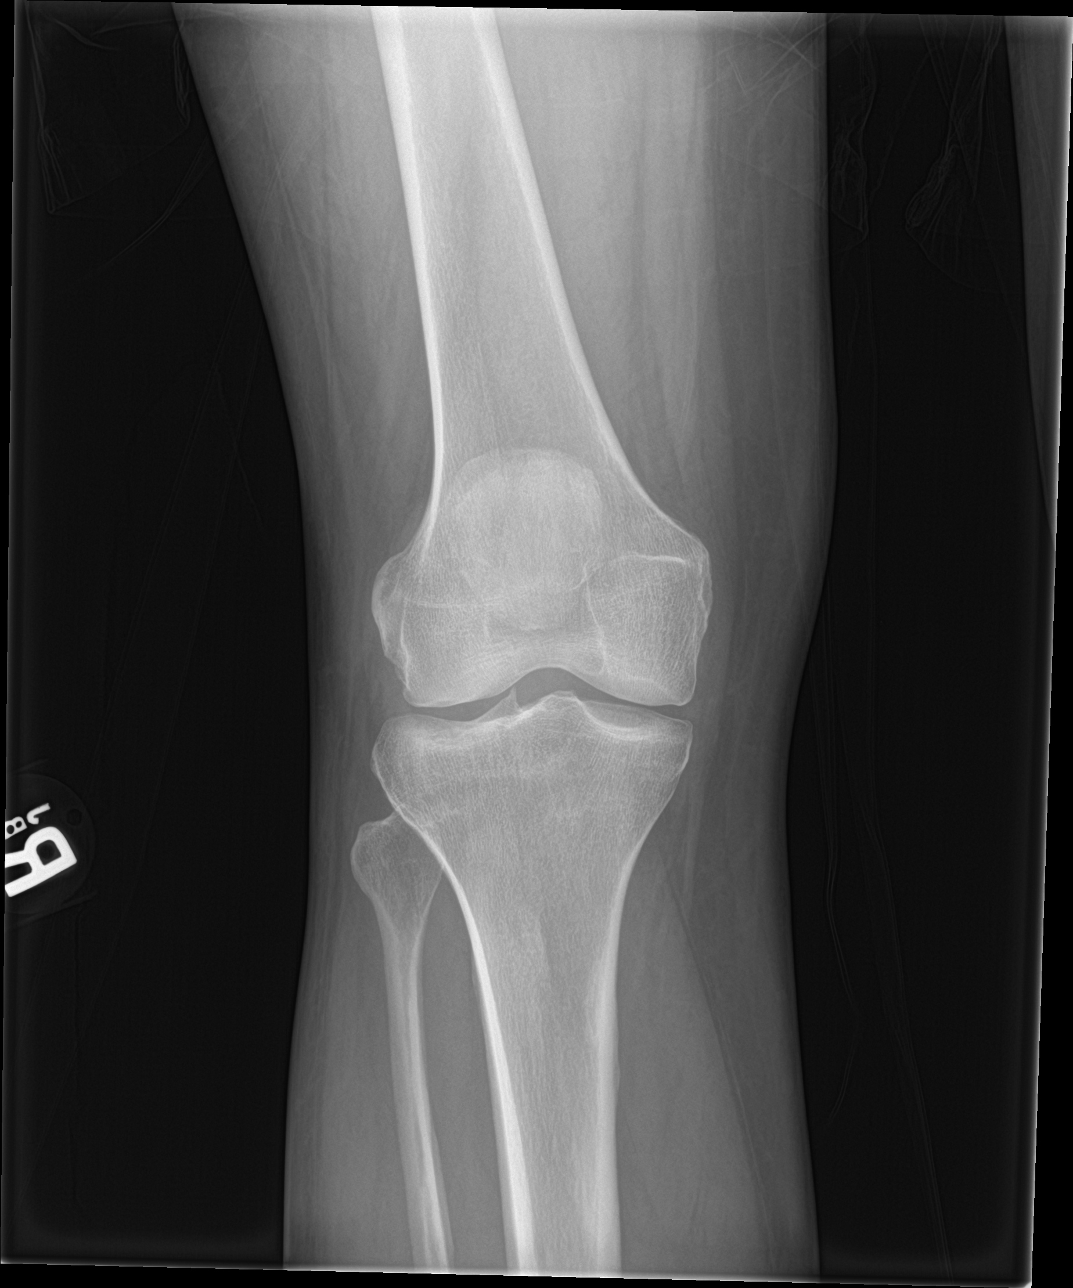

[knee lat]
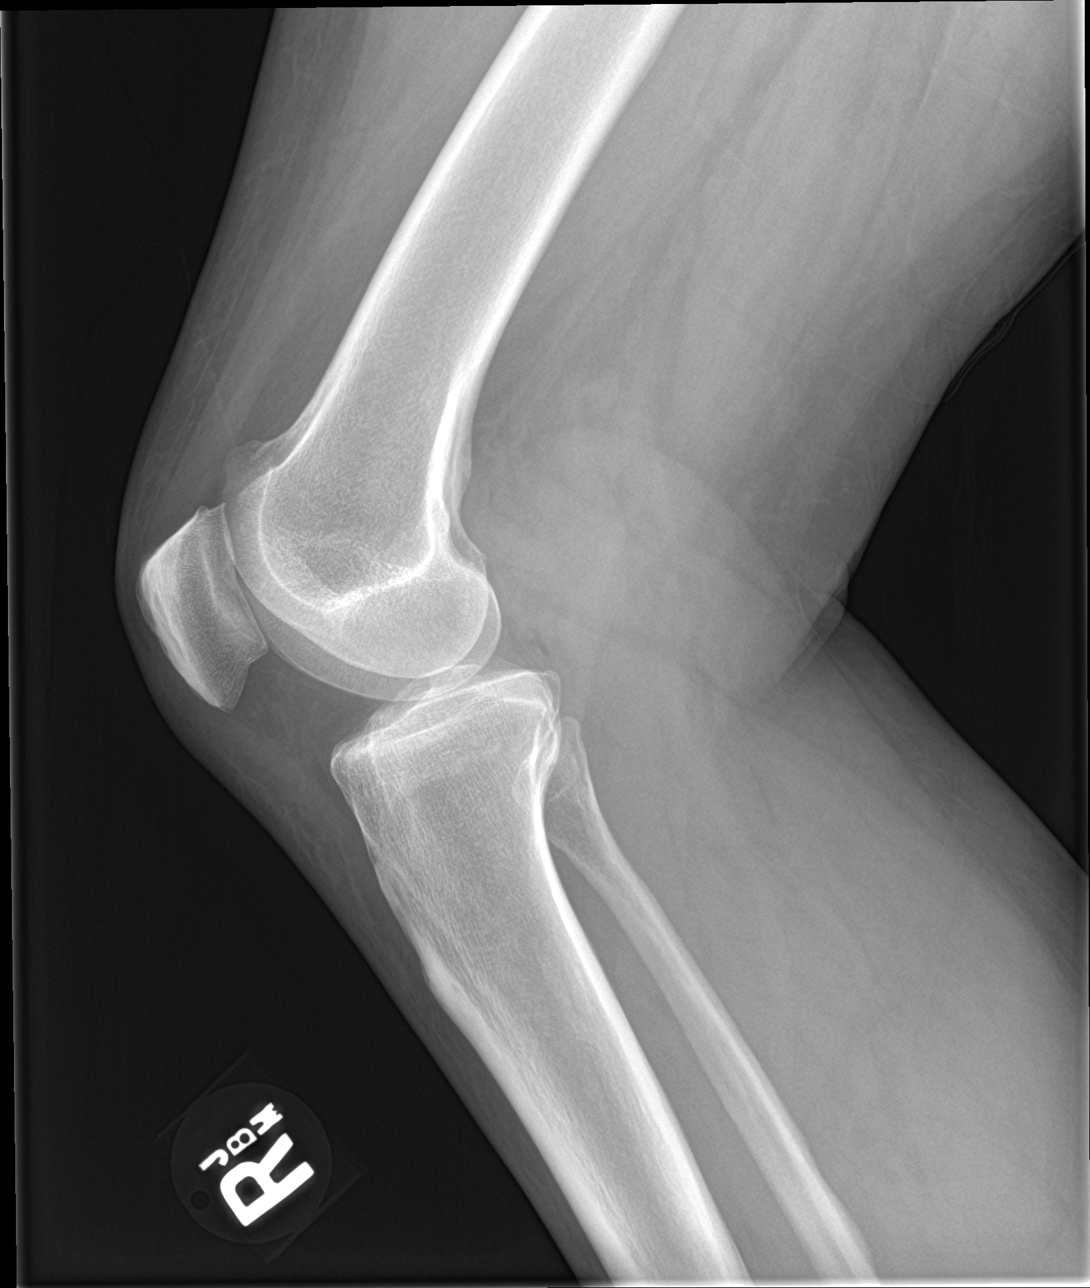

[knee obl (1 of 2)]
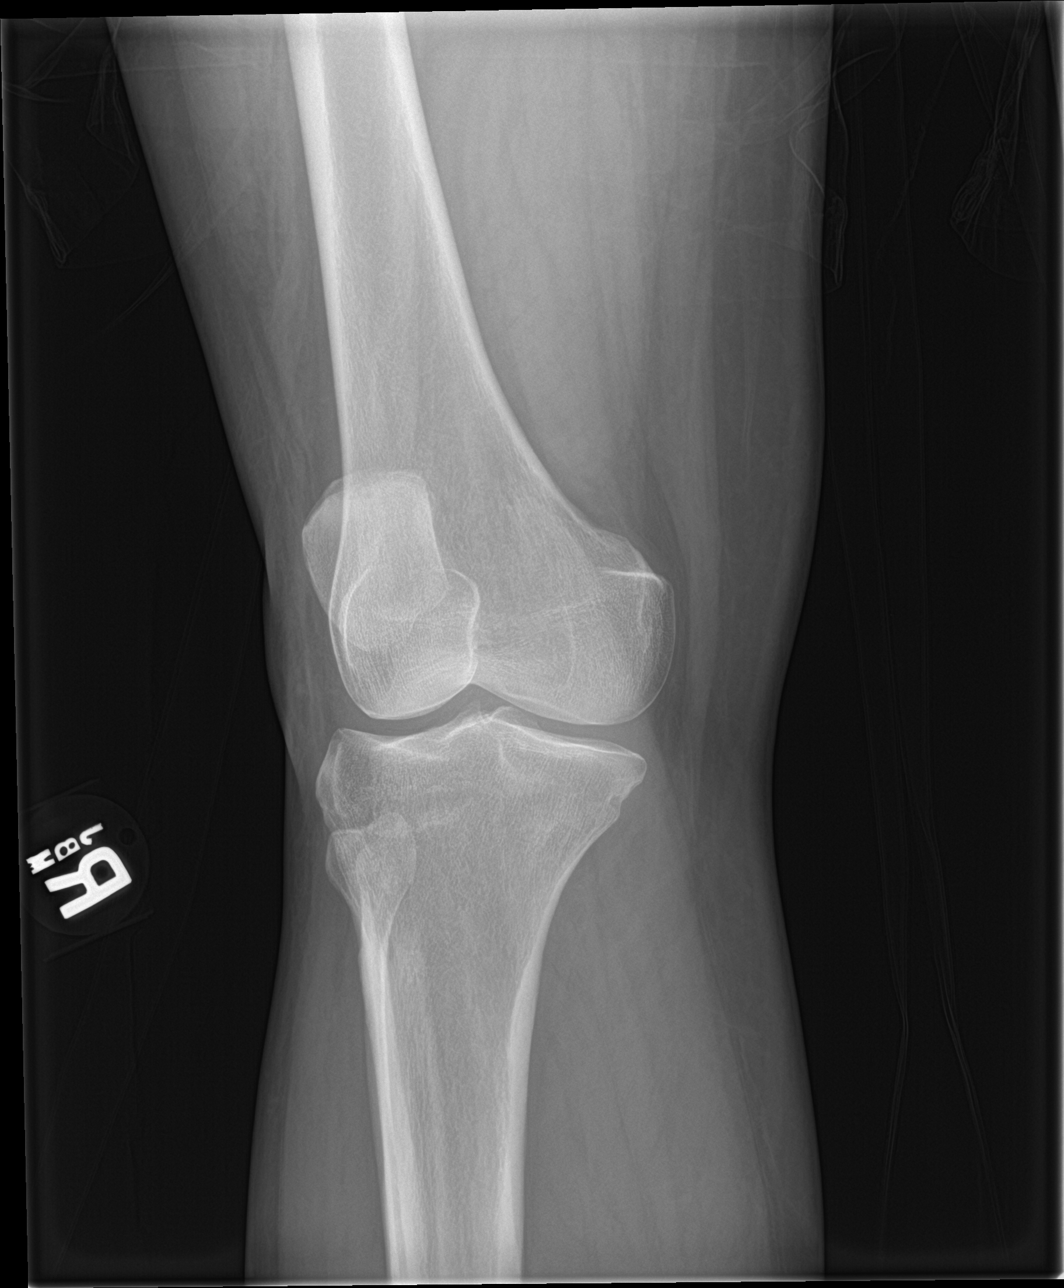

[knee obl (2 of 2)]
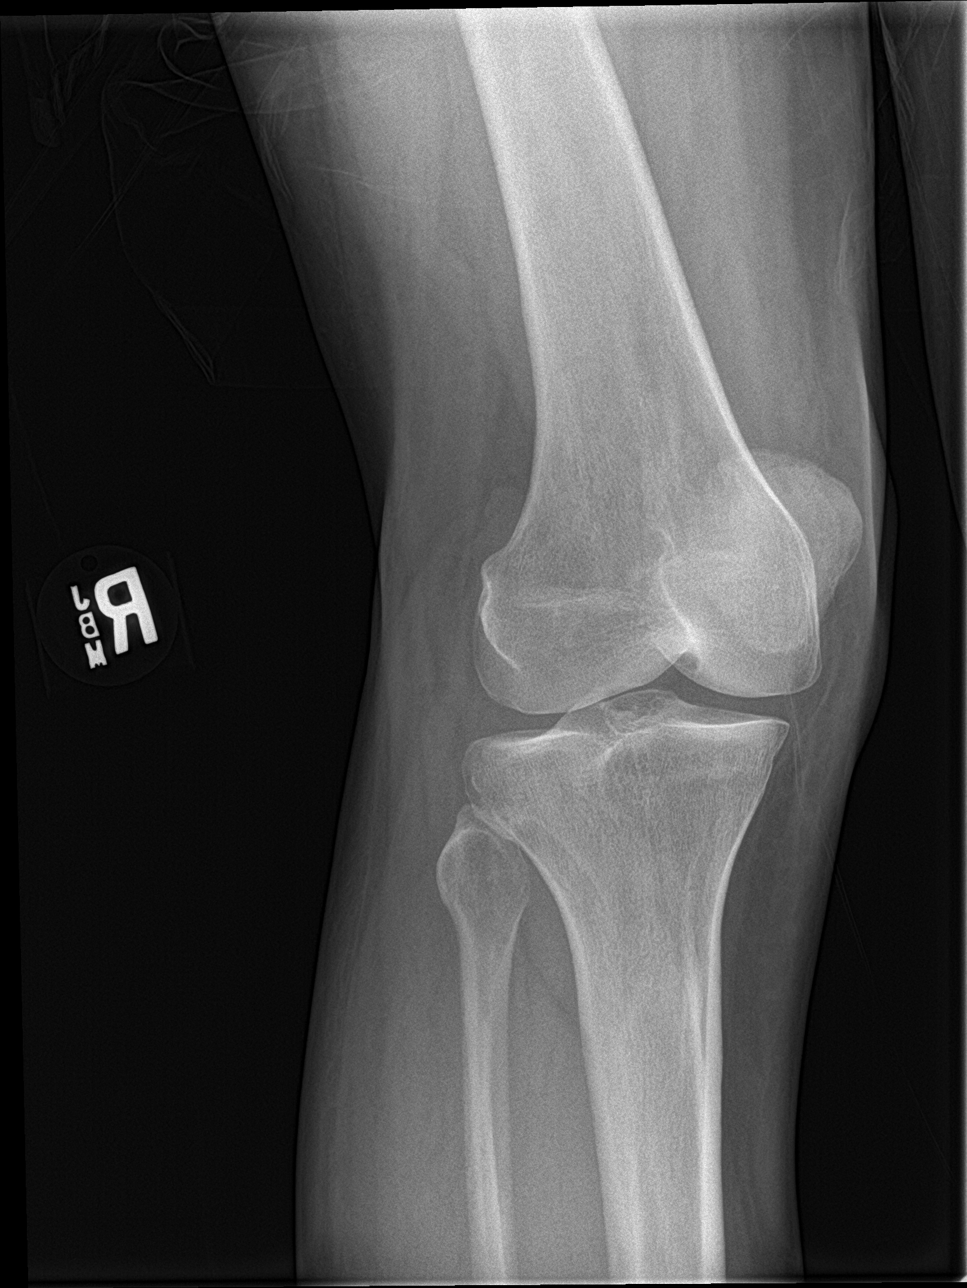

[4 of 4 positions shown; findings below may reference images not displayed]

FINDINGS: Early spurring and joint space narrowing in the medial and
patellofemoral compartments. No acute bony abnormality.
Specifically, no fracture, subluxation, or dislocation. Soft tissues
are intact. No joint effusion.
IMPRESSION: No acute bony abnormality.

## 2016-11-02 IMAGING — DX DG KNEE COMPLETE 4+V*L*
4 series · 4 of 4 positions shown · non-contrast
Comparison: [DATE]

CLINICAL DATA: Acute bilateral knee pain.  Recent fall.

EXAM:
LEFT KNEE - COMPLETE 4+ VIEW

[knee ap]
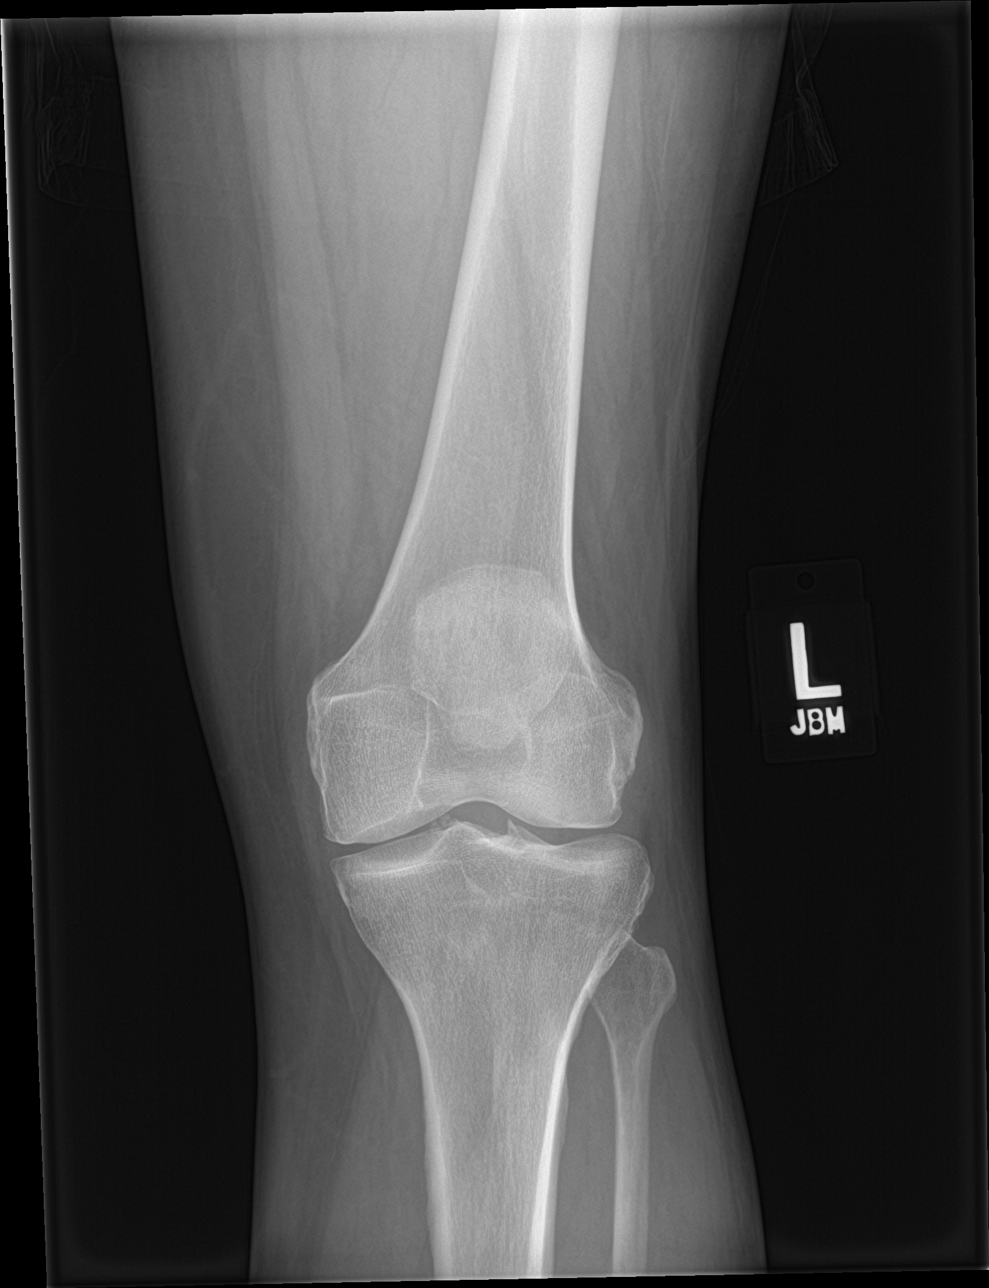

[knee lat]
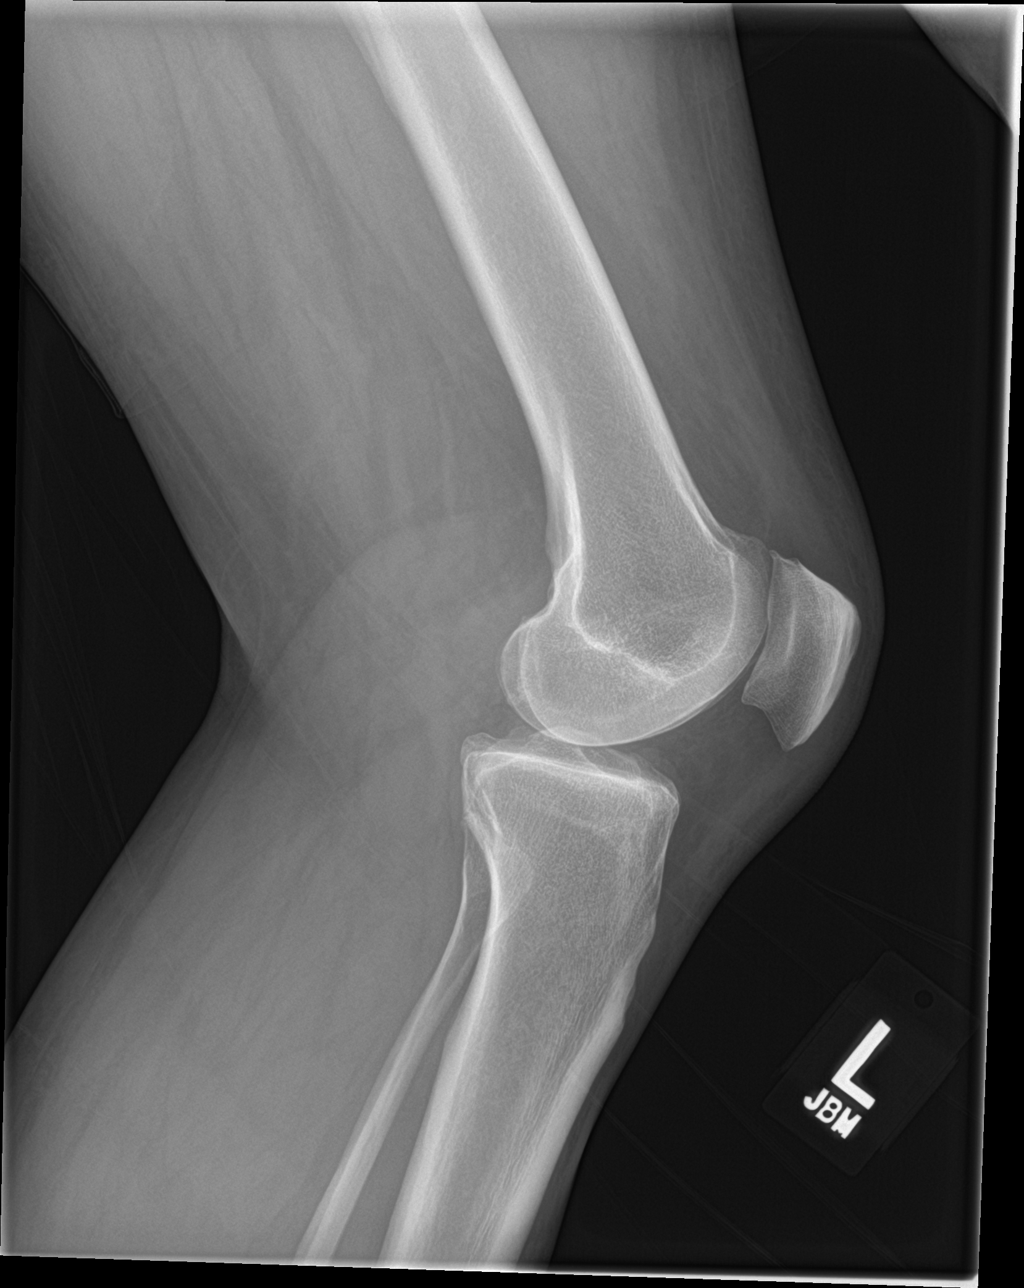

[knee obl (1 of 2)]
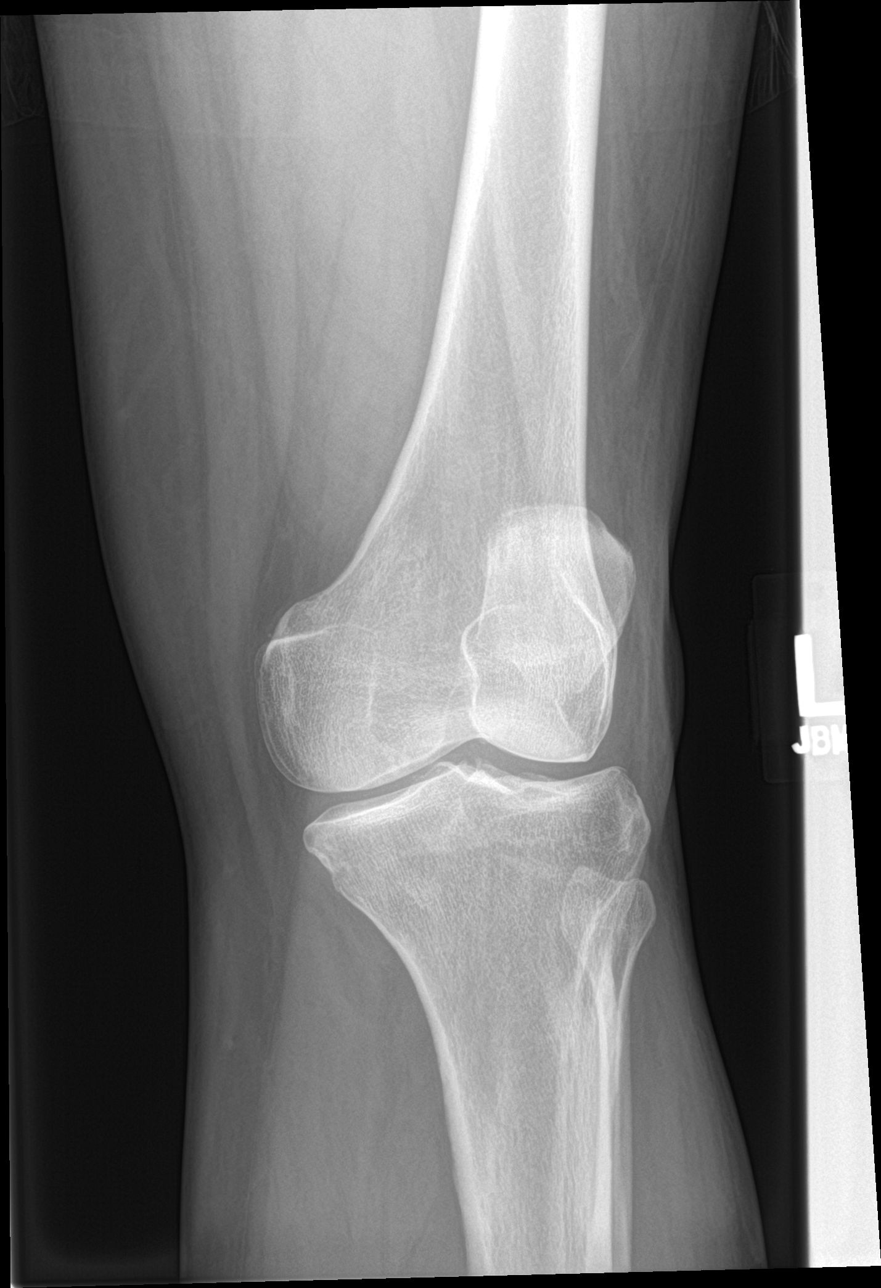

[knee obl (2 of 2)]
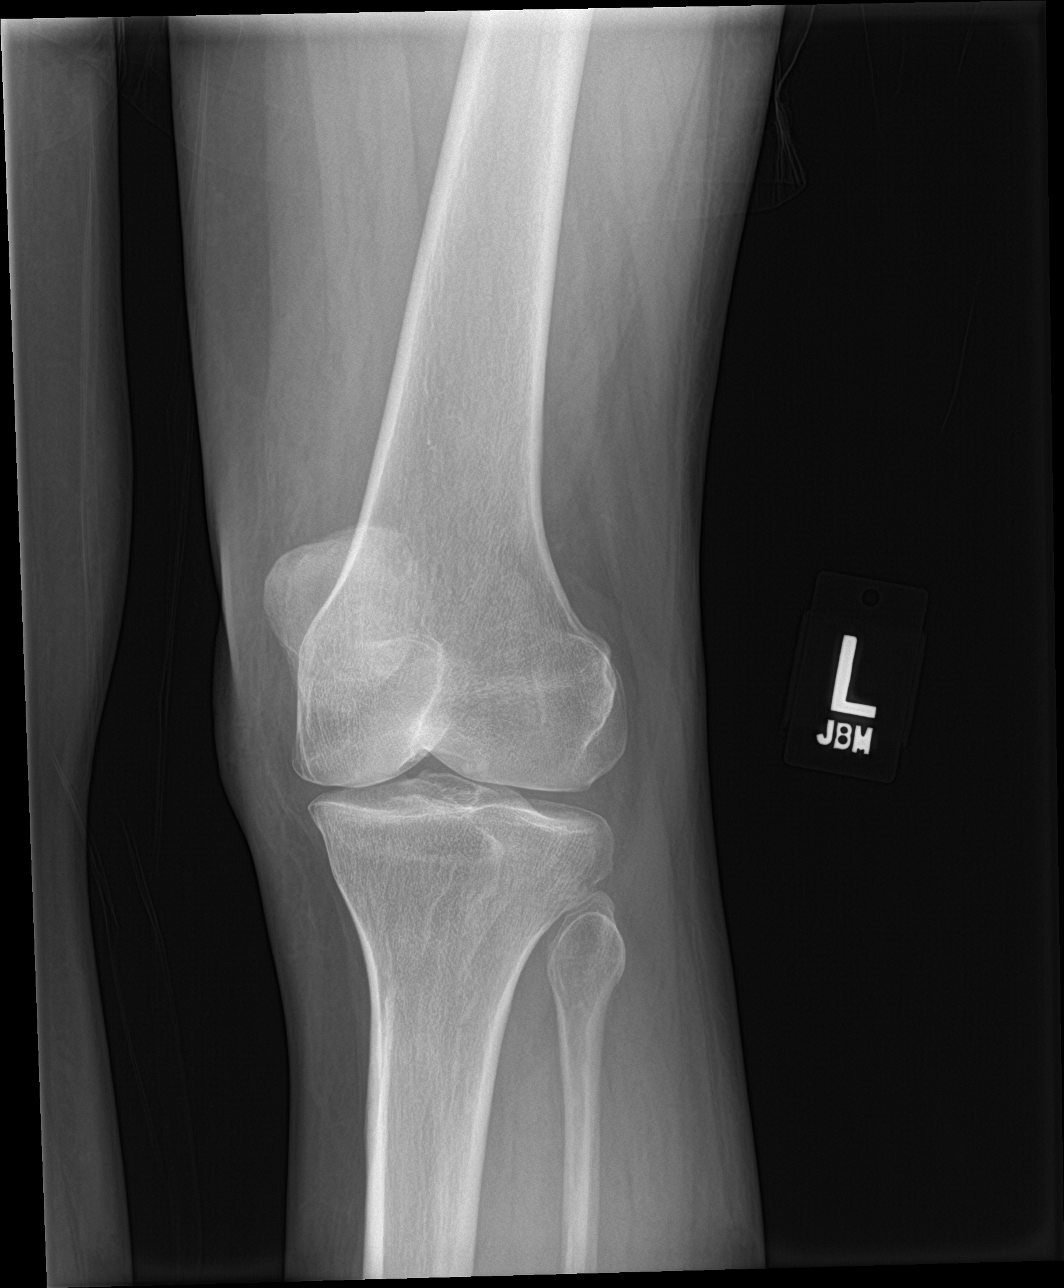

[4 of 4 positions shown; findings below may reference images not displayed]

FINDINGS: Early degenerative changes with joint space narrowing and spurring,
best seen in the patellofemoral compartment. No acute bony
abnormality. Specifically, no fracture, subluxation, or dislocation.
Soft tissues are intact. No joint effusion.
IMPRESSION: No acute bony abnormality.

## 2016-11-02 IMAGING — DX DG FOOT COMPLETE 3+V*R*
3 series · 3 of 3 positions shown · non-contrast
Comparison: None.

CLINICAL DATA: Recent fall. Pain and swelling of right ankle and
foot

EXAM:
RIGHT FOOT COMPLETE - 3+ VIEW

[foot ap]
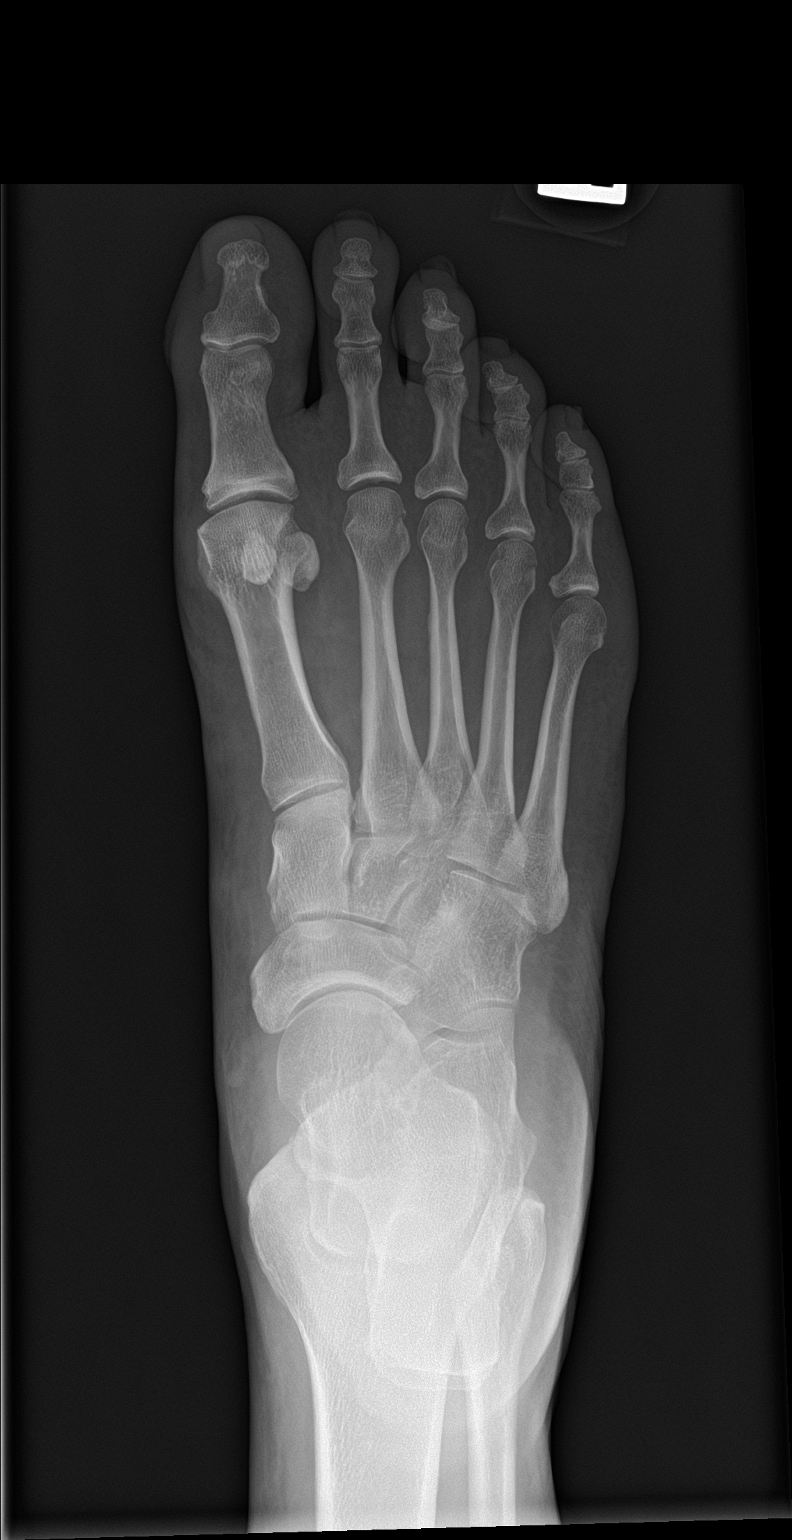

[foot obl]
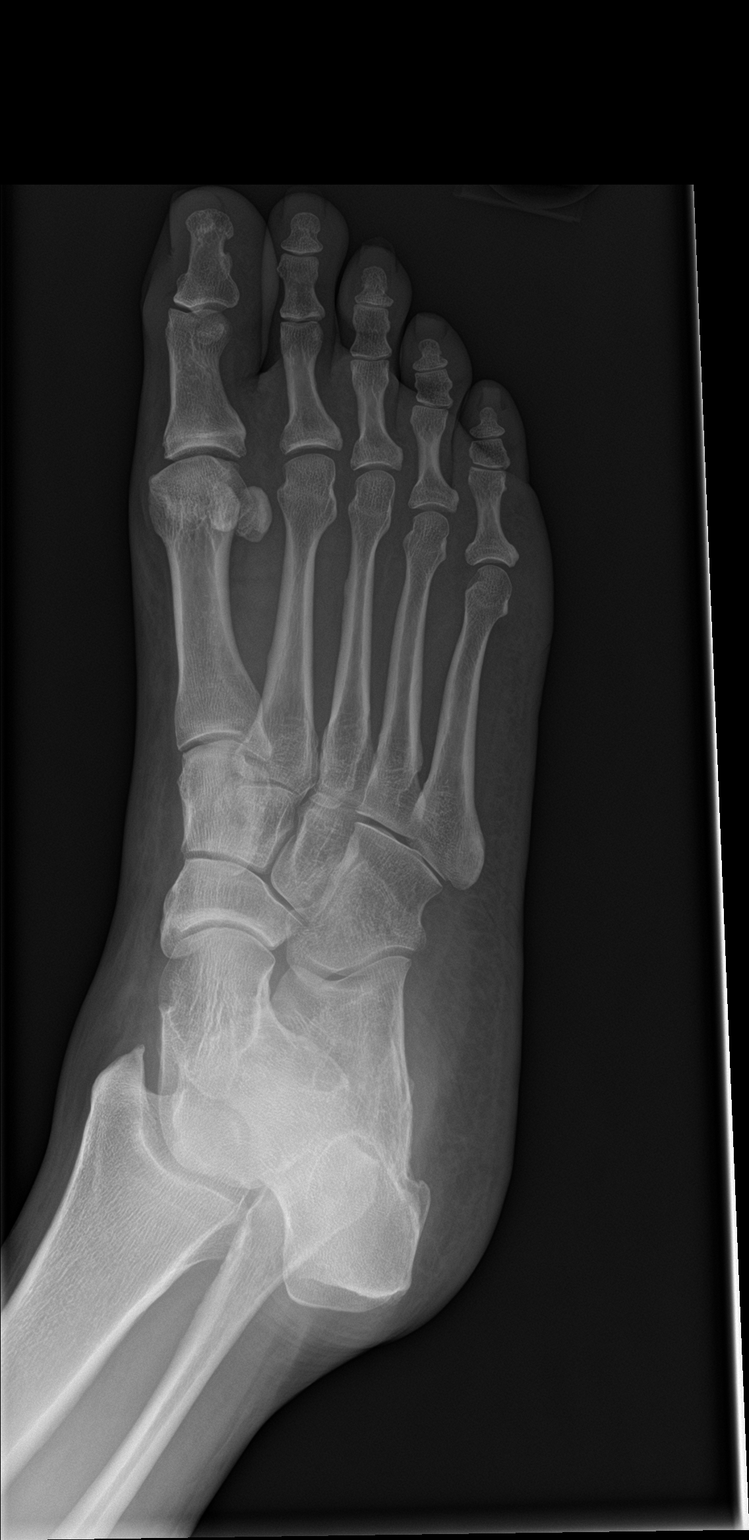

[foot lat]
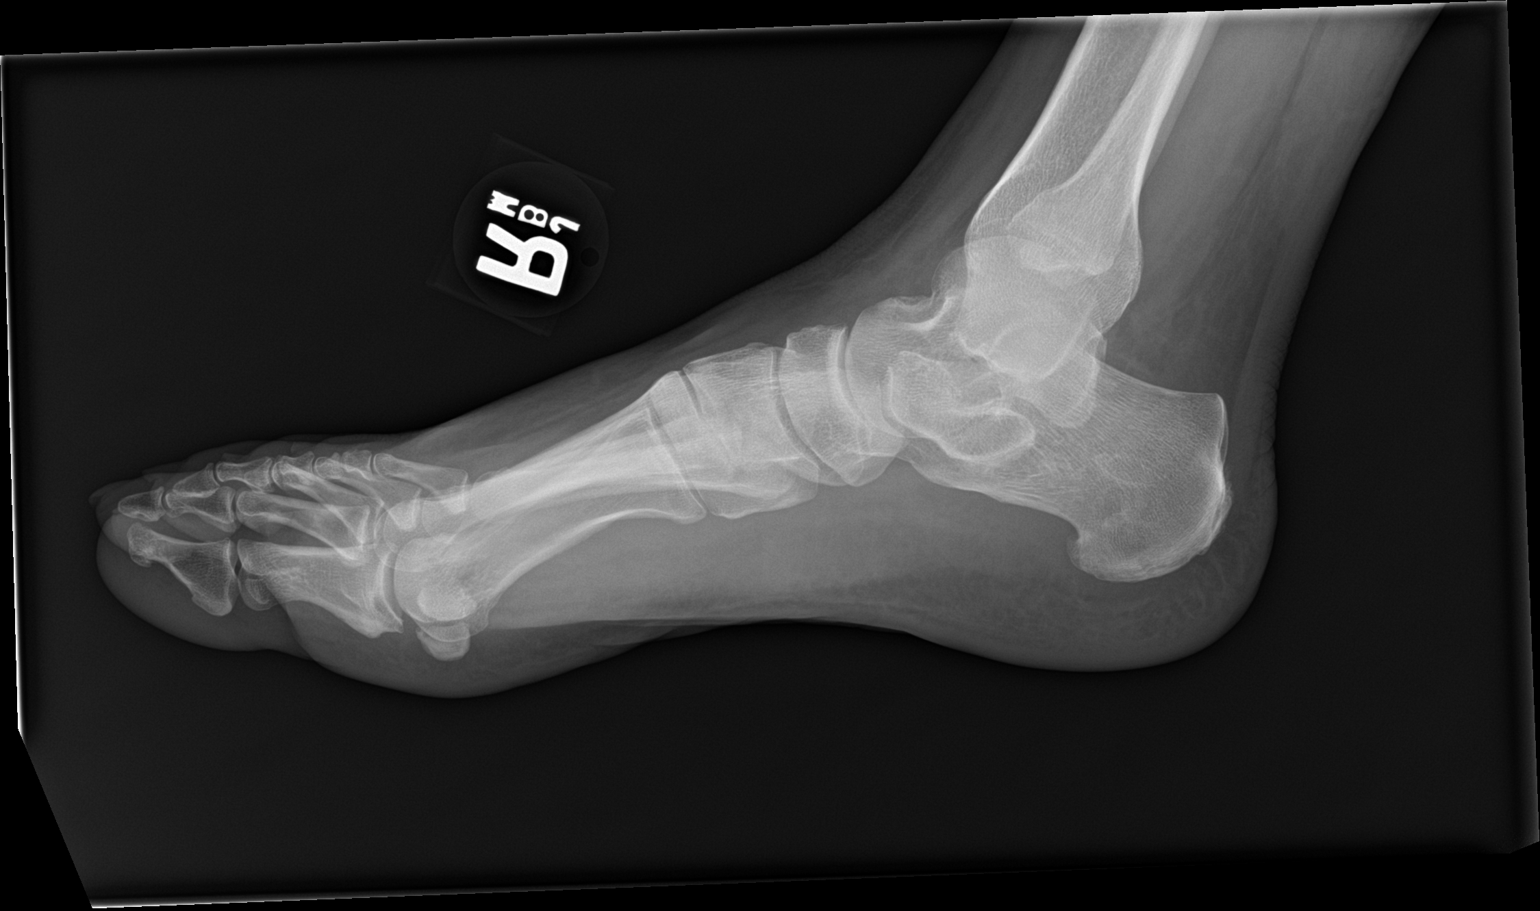

[3 of 3 positions shown; findings below may reference images not displayed]

FINDINGS: No acute bony abnormality. Specifically, no fracture, subluxation,
or dislocation. Soft tissues are intact. Joint spaces are
maintained.
IMPRESSION: No acute bony abnormality.

## 2016-11-02 MED ORDER — LISINOPRIL 20 MG PO TABS: 20.0000 mg | ORAL_TABLET | Freq: Every day | ORAL | 2 refills | Status: DC

## 2016-11-02 MED ORDER — METFORMIN HCL 500 MG PO TABS
1000.0000 mg | ORAL_TABLET | Freq: Two times a day (BID) | ORAL | 2 refills | Status: DC
Start: 2016-11-02 — End: 2017-03-05

## 2016-11-02 MED ORDER — NAPROXEN 500 MG PO TABS: 500.0000 mg | ORAL_TABLET | Freq: Two times a day (BID) | ORAL | 0 refills | Status: DC

## 2016-11-02 MED ORDER — PRAVASTATIN SODIUM 40 MG PO TABS: 40.0000 mg | ORAL_TABLET | Freq: Every day | ORAL | 2 refills | Status: DC

## 2016-11-02 MED FILL — metFORMIN HCL 500 MG TABS: 500 | 30 days supply | Qty: 120 | Fill #0

## 2016-11-02 MED FILL — NAPROXEN 500 MG TABLET: 500 | 20 days supply | Qty: 40 | Fill #0

## 2016-11-02 NOTE — Progress Notes (Signed)
Patient is her for diabetes  patient also is complaining about Knee pain and that its swelling  The right one is the worst one.  The patient also stated that she has vaginal itching   Patient has not taken any meds or have eaten today  Patient declined the flu shot today

## 2016-11-02 NOTE — Progress Notes (Signed)
Subjective:  Patient ID: Rebecca [name], female    DOB: [birth year]  Age: 51 y.o. MRN: [id number]  CC: Diabetes   HPI Rebecca [name] presents for diabetes mellitus. Symptoms: hyperglycemia. Symptoms have worsened, beginning 2 days ago.Patient denies foot ulcerations, nausea, paresthesia of the feet, visual disturbances and vomitting. She reports no longer having medications available and has been using her husbands medications. She also c/o history of a fall on Jan. 1 while trying to board a train in [place]. Reports falling forward. Denies any head injury, vision changes, or prior history of falls. Reports left knee pain, right knee pain, and right ankle swelling. Pain 10/10 at it's worst. Denies taking anything for symptoms.  Outpatient Medications Prior to Visit  Medication Sig Dispense Refill  . acetaminophen (TYLENOL) 500 MG tablet Take 1,000 mg by mouth daily as needed for mild pain. Reported on 01/16/2016    . cephALEXin (KEFLEX) 500 MG capsule Take 1 capsule (500 mg total) by mouth 2 (two) times daily. (Patient not taking: Reported on 01/16/2016) 28 capsule 0  . cetirizine (ZYRTEC) 10 MG tablet Take 1 tablet (10 mg total) by mouth daily. 30 tablet 11  . ibuprofen (ADVIL,MOTRIN) 200 MG tablet Take 400 mg by mouth daily as needed for mild pain.    Marland Kitchen lisinopril (PRINIVIL,ZESTRIL) 20 MG tablet Take 1 tablet (20 mg total) by mouth daily. (Patient not taking: Reported on 02/10/2016) 90 tablet 1  . metFORMIN (GLUCOPHAGE) 500 MG tablet Take 2 tablets (1,000 mg total) by mouth 2 (two) times daily with a meal. 180 tablet 1  . nystatin-triamcinolone ointment (MYCOLOG) Apply 1 application topically 2 (two) times daily. 30 g 0  . pravastatin (PRAVACHOL) 40 MG tablet Take 1 tablet (40 mg total) by mouth daily. 90 tablet 1   No facility-administered medications prior to visit.     ROS Review of Systems  Eyes: Negative.   Respiratory: Negative.   Cardiovascular: Negative.   Gastrointestinal:  Negative.   Musculoskeletal: Positive for arthralgias, joint swelling and myalgias.  Skin: Negative.   Neurological: Negative.    Objective:  There were no vitals taken for this visit.  BP/Weight 02/10/2016 02/08/2016 0000000  Systolic BP [id number]  Diastolic BP 80 84 [id number]  Wt. (Lbs) 152 - 155  BMI 26.93 - 27.46   Physical Exam  Constitutional: She is oriented to person, place, and time.  Eyes: Pupils are equal, round, and reactive to light.  Cardiovascular: Normal rate, regular rhythm, normal heart sounds and intact distal pulses.   Pulmonary/Chest: Effort normal and breath sounds normal.  Abdominal: Soft. Bowel sounds are normal.  Musculoskeletal: Normal range of motion. She exhibits tenderness.       Right knee: She exhibits bony tenderness.       Left knee: She exhibits bony tenderness.       Right ankle: She exhibits swelling. She exhibits normal pulse.       Left foot: There is deformity (bunion to left metarsal joint).  Neurological: She is alert and oriented to person, place, and time.   Assessment & Plan:   Problem List Items Addressed This Visit      Endocrine   Diabetes mellitus type 2, controlled ([place]) - Primary   Relevant Medications   metFORMIN (GLUCOPHAGE) 500 MG tablet   lisinopril (PRINIVIL,ZESTRIL) 20 MG tablet   pravastatin (PRAVACHOL) 40 MG tablet   Other Relevant Orders   POCT glycosylated hemoglobin (Hb A1C) (Completed)   Microalbumin / creatinine urine ratio (  Completed)   Lipid panel (Completed)   POCT glucose (manual entry) (Completed)   Basic metabolic panel (Completed)   Ambulatory referral to Ophthalmology     Other   Hyperlipidemia   Relevant Medications   lisinopril (PRINIVIL,ZESTRIL) 20 MG tablet   pravastatin (PRAVACHOL) 40 MG tablet    Other Visit Diagnoses    Acute bilateral knee pain       Relevant Medications   naproxen (NAPROSYN) 500 MG tablet   Other Relevant Orders   DG Knee Complete 4 Views Left (Completed)   DG Knee  Complete 4 Views Right (Completed)   History of recent fall       Relevant Orders   DG Knee Complete 4 Views Left (Completed)   DG Knee Complete 4 Views Right (Completed)   DG Foot Complete Left (Completed)   DG Foot Complete Right (Completed)   Pain and swelling of right ankle       Relevant Medications   naproxen (NAPROSYN) 500 MG tablet   Other Relevant Orders   DG Foot Complete Right (Completed)   Left foot pain       Relevant Orders   DG Knee Complete 4 Views Left (Completed)   Essential hypertension       Relevant Medications   lisinopril (PRINIVIL,ZESTRIL) 20 MG tablet   pravastatin (PRAVACHOL) 40 MG tablet     Meds ordered this encounter  Medications  . naproxen (NAPROSYN) 500 MG tablet    Sig: Take 1 tablet (500 mg total) by mouth 2 (two) times daily with a meal.    Dispense:  40 tablet    Refill:  0    Order Specific Question:   Supervising Provider    Answer:   Tresa Garter [id number]  . metFORMIN (GLUCOPHAGE) 500 MG tablet    Sig: Take 2 tablets (1,000 mg total) by mouth 2 (two) times daily with a meal.    Dispense:  120 tablet    Refill:  2    Order Specific Question:   Supervising Provider    Answer:   Tresa Garter [id number]  . lisinopril (PRINIVIL,ZESTRIL) 20 MG tablet    Sig: Take 1 tablet (20 mg total) by mouth daily.    Dispense:  30 tablet    Refill:  2    Order Specific Question:   Supervising Provider    Answer:   Tresa Garter [id number]  . pravastatin (PRAVACHOL) 40 MG tablet    Sig: Take 1 tablet (40 mg total) by mouth daily.    Dispense:  30 tablet    Refill:  2    Order Specific Question:   Supervising Provider    Answer:   Tresa Garter [id number]    Follow-up: Return in about 3 months (around 01/31/2017) for Diabetes.   Alfonse Spruce FNP

## 2016-11-03 LAB — MICROALBUMIN / CREATININE URINE RATIO
CREATININE, URINE: 141 mg/dL (ref 20–320)
MICROALB UR: 0.6 mg/dL
Microalb Creat Ratio: 4 mcg/mg creat (ref <30)

## 2016-11-05 ENCOUNTER — Other Ambulatory Visit: Payer: Self-pay | Admitting: Family Medicine

## 2016-11-06 ENCOUNTER — Other Ambulatory Visit: Payer: Self-pay | Admitting: Family Medicine

## 2016-11-09 ENCOUNTER — Telehealth: Payer: Self-pay | Admitting: *Deleted

## 2016-11-09 ENCOUNTER — Other Ambulatory Visit: Payer: Self-pay | Admitting: Family Medicine

## 2016-11-09 DIAGNOSIS — M25562 Pain in left knee: Secondary | ICD-10-CM

## 2016-11-09 DIAGNOSIS — M2012 Hallux valgus (acquired), left foot: Secondary | ICD-10-CM

## 2016-11-09 DIAGNOSIS — E782 Mixed hyperlipidemia: Secondary | ICD-10-CM

## 2016-11-09 DIAGNOSIS — M25561 Pain in right knee: Secondary | ICD-10-CM

## 2016-11-09 MED ORDER — ATORVASTATIN CALCIUM 20 MG PO TABS: 20.0000 mg | ORAL_TABLET | Freq: Every day | ORAL | 2 refills | Status: DC

## 2016-11-09 MED FILL — ATORVASTATIN 20 MG TABLET: 20 | 30 days supply | Qty: 30 | Fill #0

## 2016-11-09 NOTE — Telephone Encounter (Signed)
-----   Message from Alfonse Spruce, [place] sent at 11/09/2016  9:49 AM EST ----- Odette Horns showed left foot bunion and narrowing of the knee joints. No fractures. Referral has been made to orthopedics. -Cholesterol level were elevated. This can increase your risk of heart disease. You were prescribed atorvastatin to help lower these levels. Discontinue taking pravastatin. Recheck labs in 3 months. -Microalbumin/creatinine ratio level was normal. This tests for protein in your urine that can indicate early signs of kidney damage.  -All other labs are normal.

## 2016-11-09 NOTE — Telephone Encounter (Signed)
Medical Assistant used [employer] to contact patient.  Interpreter Name: Zenon Mayo #: [id number] Patient was not available, [employer] left patient a voicemail. Voicemail states to give a call back to [place] with [facility] at [phone].

## 2016-11-22 ENCOUNTER — Ambulatory Visit (INDEPENDENT_AMBULATORY_CARE_PROVIDER_SITE_OTHER): Payer: BLUE CROSS/BLUE SHIELD | Admitting: Family

## 2016-11-22 ENCOUNTER — Encounter (INDEPENDENT_AMBULATORY_CARE_PROVIDER_SITE_OTHER): Payer: Self-pay | Admitting: Orthopedic Surgery

## 2016-11-22 DIAGNOSIS — M25561 Pain in right knee: Secondary | ICD-10-CM

## 2016-11-22 DIAGNOSIS — M25562 Pain in left knee: Secondary | ICD-10-CM

## 2016-11-22 DIAGNOSIS — M25571 Pain in right ankle and joints of right foot: Secondary | ICD-10-CM | POA: Diagnosis not present

## 2016-11-22 DIAGNOSIS — M25471 Effusion, right ankle: Secondary | ICD-10-CM

## 2016-11-22 MED ORDER — NAPROXEN 500 MG PO TABS: 500.0000 mg | ORAL_TABLET | Freq: Two times a day (BID) | ORAL | 0 refills | Status: DC

## 2016-11-22 MED FILL — NAPROXEN 500 MG TABLET: 500 | 20 days supply | Qty: 40 | Fill #0

## 2016-11-22 NOTE — Progress Notes (Signed)
Office Visit Note   Patient: Rebecca [name]           Date of Birth: 04-11-1966           MRN: [id number] Visit Date: 11/22/2016              Requested by: Alfonse Spruce, [address] [place], [place] [address] PCP: Fredia Beets, FNP  Chief Complaint  Patient presents with  . Left Knee - Pain  . Right Knee - Pain  . Left Great Toe - Pain    HPI: Patient presents today with left great toe pain. She has hallux valgus deformity. She has been seen for medical evaluation for this before and was recommended stiff sole shoes. This no longer helps. She states the pain has been ongoing for two years now. She has had diagnostic imaging done of bilateral feet.  Patient also complains of bilateral knee pain since December 31st 2017. She fell down and hit both knees on a piece of metal. She states right knee is worse than the right. She reports of previous swelling. She was seen by medical doctor who prescribed naproxen. She has taken this seldom. She has had diagnostic imaging done of bilateral knees. Maxcine Ham, RT  The patient is a 51 year old woman who is seen today for evaluation of 2 separate issues. She has a bunion deformity of the left great toe. Has tried stiff soled walking shoes. She is in a stiff [employer] clog today. Sole does not bed. This is not providing much relief. She is hopeful to have this surgically repaired. She will be travelling to [place] from march to April and would like to proceed with surgery when she returns this summer.   She fell on new years eve directly on her bilateral knees. Has had continued anterior knee pain. This is exacerbated by kneeling. This is especially trouble some as she prays often and is unable to be on her knees. Minimal pain with weight bearing.  No swelling.     Assessment & Plan: Visit Diagnoses:  1. Acute bilateral knee pain   2. Pain and swelling of right ankle     Plan: Have given her the surgical  scheduler's card. She's to call when she returns to set up surgery for bunionectomy. Reassured that with conservative measures the knee pain will resolved. She will continue with Naproxen for pain. May use ice and rest.   Follow-Up Instructions: Return if symptoms worsen or fail to improve.   Physical Exam  Constitutional: Appears well-developed.  Head: Normocephalic.  Eyes: EOM are normal.  Neck: Normal range of motion.  Cardiovascular: Normal rate.   Pulmonary/Chest: Effort normal.  Neurological: Is alert.  Skin: Skin is warm.  Psychiatric: Has a normal mood and affect.  Right Knee Exam   Tenderness  The patient is experiencing tenderness in the lateral joint line, medial joint line and patella.  Muscle Strength   The patient has normal right knee strength.  Tests  Varus: negative Valgus: negative  Other  Erythema: absent Swelling: none Other tests: no effusion present   Left Knee Exam   Tenderness  The patient is experiencing tenderness in the medial joint line and patella.  Muscle Strength   The patient has normal left knee strength.  Tests  Varus: negative Valgus: negative  Other  Erythema: absent Swelling: none Effusion: no effusion present    Left foot: valgus deformity of left great toe. No callus. No ulcer. No erythema.  Does have a strong DP pulse.    Imaging: No results found.  Orders:  No orders of the defined types were placed in this encounter.  Meds ordered this encounter  Medications  . naproxen (NAPROSYN) 500 MG tablet    Sig: Take 1 tablet (500 mg total) by mouth 2 (two) times daily with a meal.    Dispense:  40 tablet    Refill:  0     Procedures: No procedures performed  Clinical Data: No additional findings.  Subjective: Review of Systems  Objective: Vital Signs: Ht 5\' 3"  (1.6 m)   Wt 156 lb (70.8 kg)   BMI 27.63 kg/m   Specialty Comments:  No specialty comments available.  PMFS History: Patient Active  Problem List   Diagnosis Date Noted  . Muscle pain 08/06/2013  . Tiredness 08/06/2013  . Candida vaginitis 08/19/2012  . Dental cavities 08/19/2012  . Knee pain, bilateral 04/16/2012  . Diabetes mellitus type 2, controlled ([place]) 04/16/2012  . Hyperlipidemia 04/16/2012  . Palpitations 04/16/2012  . Back pain 04/16/2012   Past Medical History:  Diagnosis Date  . Arthritis   . Diabetes mellitus   . Hyperlipidemia     Family History  Problem Relation Age of Onset  . Diabetes Mother   . Heart disease Mother   . Diabetes Father   . Cancer Father 47    Throat  . Diabetes Sister   . Diabetes Brother     Past Surgical History:  Procedure Laterality Date  . CESAREAN SECTION     Social History   Occupational History  . Not on file.   Social History Main Topics  . Smoking status: Passive Smoke Exposure - Never Smoker  . Smokeless tobacco: Never Used  . Alcohol use No  . Drug use: No  . Sexual activity: Yes

## 2016-12-10 IMAGING — US US BREAST*L* LIMITED INC AXILLA
1 series · 5 of 5 positions shown · non-contrast
Comparison: [DATE]

CLINICAL DATA: Possible mass left breast on recent baseline
screening mammogram.

EXAM:
2D DIGITAL DIAGNOSTIC LEFT MAMMOGRAM WITH CAD AND ADJUNCT TOMO
ULTRASOUND LEFT BREAST

[Series 1: us breast*left* limited inc axilla · 0.04mm/px · 5 of 5 slices shown]
[im 1/5]
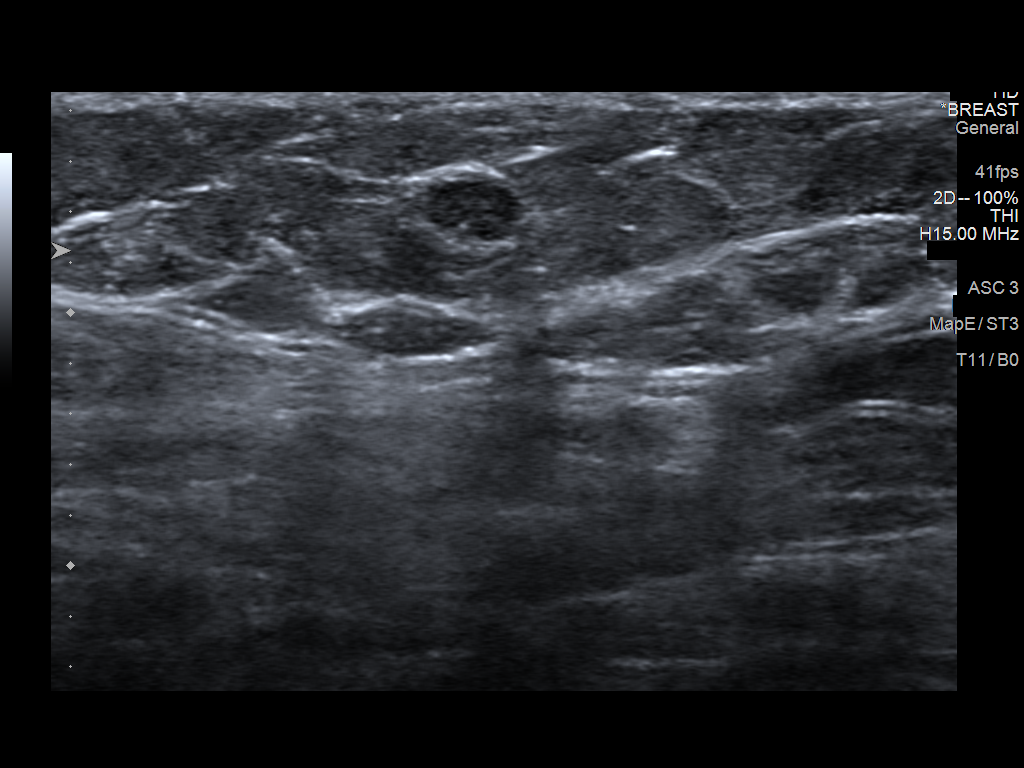
[im 2/5]
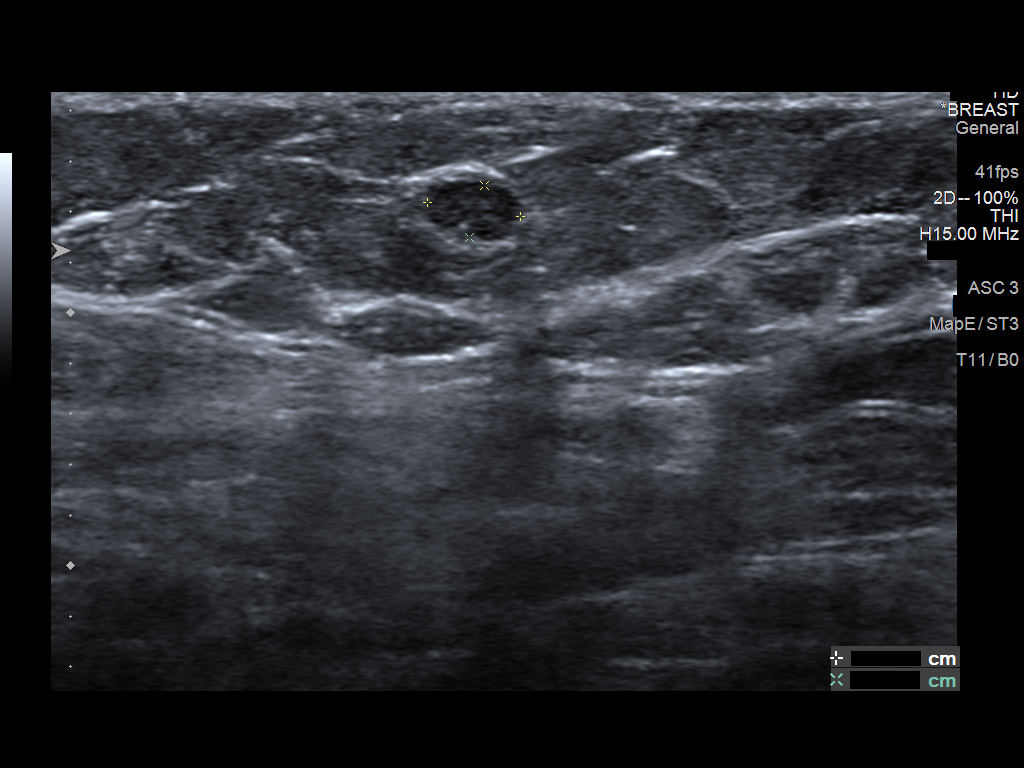
[im 3/5]
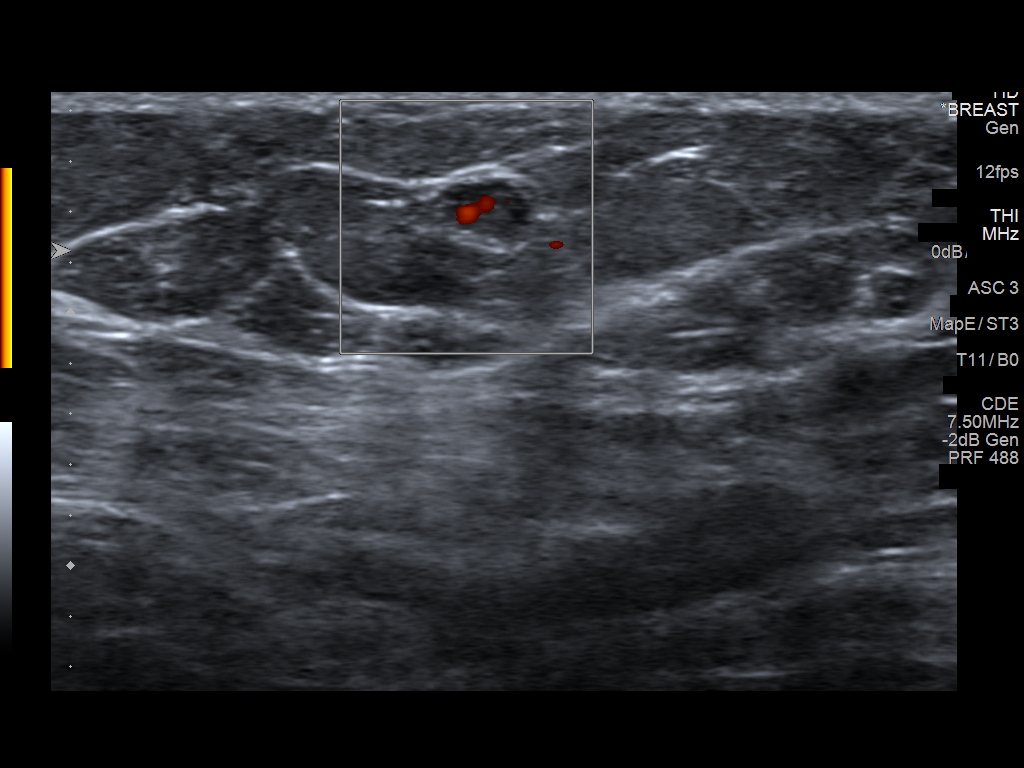
[im 4/5]
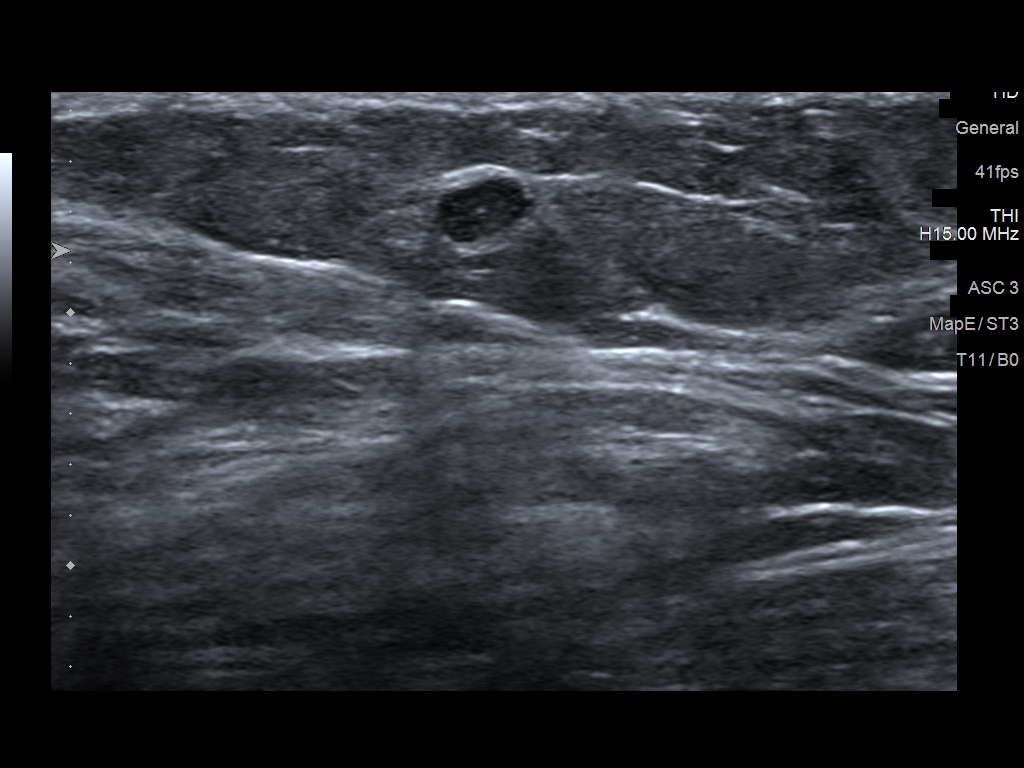
[im 5/5]
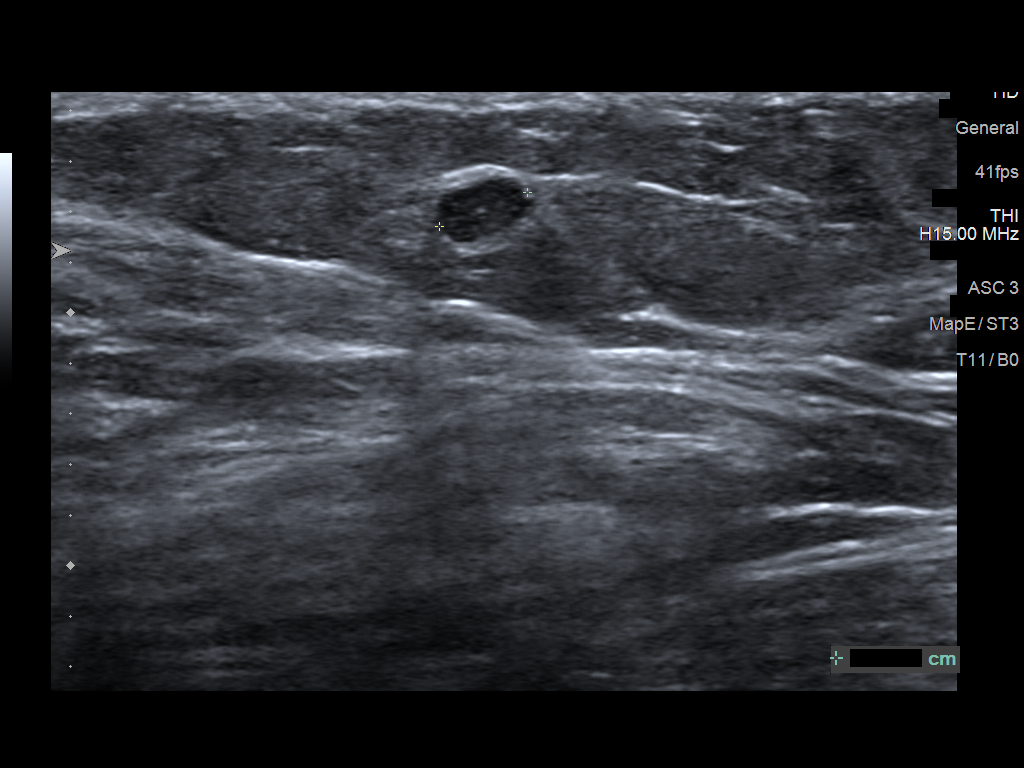

[5 of 5 positions shown; findings below may reference images not displayed]

ACR Breast Density Category b: There are scattered areas of
fibroglandular density.
FINDINGS: 3D tomographic images of the left breast in the MLO and exaggerated
CC lateral projections are obtained. There is a circumscribed 3 mm
nodule in the axillary tail of the left breast with a probable fatty
hilum. This is favored to be an axillary tail intramammary lymph
node.

Mammographic images were processed with CAD.

Targeted ultrasound is performed, showing a circumscribed oval
hypoechoic nodule measuring 4 x 2 x 4 mm. There is central
echogenicity with vascular flow. Findings are consistent with an
intramammary lymph node. This is at [DATE] position 12 cm from the
nipple.
IMPRESSION: Benign intramammary lymph node axillary tail left breast. No
evidence of malignancy.

RECOMMENDATION:
Screening mammogram in one year.(Code:[H3])

I have discussed the findings and recommendations with the patient.
Results were also provided in writing at the conclusion of the
visit. If applicable, a reminder letter will be sent to the patient
regarding the next appointment.

BI-RADS CATEGORY  2: Benign.

## 2016-12-17 MED FILL — LISINOPRIL 20 MG TABLET: 20 | 30 days supply | Qty: 30 | Fill #0

## 2016-12-17 MED FILL — CLOMIPHENE CITRATE 50 MG TA: 50 | 90 days supply | Qty: 45 | Fill #0

## 2016-12-17 MED FILL — PRAVASTATIN NA 40 MG TAB: 40 | 30 days supply | Qty: 30 | Fill #0

## 2016-12-20 ENCOUNTER — Other Ambulatory Visit: Payer: Self-pay | Admitting: Family Medicine

## 2016-12-20 DIAGNOSIS — M25562 Pain in left knee: Principal | ICD-10-CM

## 2016-12-20 DIAGNOSIS — M25561 Pain in right knee: Secondary | ICD-10-CM

## 2016-12-20 DIAGNOSIS — M25571 Pain in right ankle and joints of right foot: Secondary | ICD-10-CM

## 2016-12-20 DIAGNOSIS — M25471 Effusion, right ankle: Secondary | ICD-10-CM

## 2017-02-19 MED FILL — ATORVASTATIN 20 MG TABLET: 20 | 30 days supply | Qty: 30 | Fill #1

## 2017-02-19 MED FILL — metFORMIN HCL 500 MG TABS: 500 | 30 days supply | Qty: 120 | Fill #1

## 2017-03-05 ENCOUNTER — Ambulatory Visit: Payer: BLUE CROSS/BLUE SHIELD | Attending: Family Medicine | Admitting: Family Medicine

## 2017-03-05 VITALS — BP 134/86 | HR 64 | Temp 97.3°F | Resp 18 | Ht 63.0 in | Wt 150.2 lb

## 2017-03-05 DIAGNOSIS — Z7984 Long term (current) use of oral hypoglycemic drugs: Secondary | ICD-10-CM | POA: Diagnosis not present

## 2017-03-05 DIAGNOSIS — E1169 Type 2 diabetes mellitus with other specified complication: Secondary | ICD-10-CM

## 2017-03-05 DIAGNOSIS — M546 Pain in thoracic spine: Secondary | ICD-10-CM

## 2017-03-05 DIAGNOSIS — E785 Hyperlipidemia, unspecified: Secondary | ICD-10-CM | POA: Diagnosis not present

## 2017-03-05 DIAGNOSIS — E1165 Type 2 diabetes mellitus with hyperglycemia: Secondary | ICD-10-CM

## 2017-03-05 DIAGNOSIS — N898 Other specified noninflammatory disorders of vagina: Secondary | ICD-10-CM

## 2017-03-05 DIAGNOSIS — I10 Essential (primary) hypertension: Secondary | ICD-10-CM | POA: Diagnosis not present

## 2017-03-05 DIAGNOSIS — E119 Type 2 diabetes mellitus without complications: Secondary | ICD-10-CM | POA: Diagnosis present

## 2017-03-05 DIAGNOSIS — E782 Mixed hyperlipidemia: Secondary | ICD-10-CM | POA: Diagnosis not present

## 2017-03-05 DIAGNOSIS — Z79899 Other long term (current) drug therapy: Secondary | ICD-10-CM | POA: Diagnosis not present

## 2017-03-05 DIAGNOSIS — M545 Low back pain: Secondary | ICD-10-CM | POA: Diagnosis not present

## 2017-03-05 LAB — POCT GLYCOSYLATED HEMOGLOBIN (HGB A1C): HEMOGLOBIN A1C: 9

## 2017-03-05 LAB — GLUCOSE, POCT (MANUAL RESULT ENTRY): POC GLUCOSE: 136 mg/dL — AB (ref 70–99)

## 2017-03-05 MED ORDER — LABETALOL HCL 100 MG PO TABS: 100.0000 mg | ORAL_TABLET | Freq: Two times a day (BID) | ORAL | 2 refills | Status: DC

## 2017-03-05 MED ORDER — GLIPIZIDE 5 MG PO TABS: 5.0000 mg | ORAL_TABLET | Freq: Every day | ORAL | 2 refills | Status: DC

## 2017-03-05 MED ORDER — FLUCONAZOLE 150 MG PO TABS: 150.0000 mg | ORAL_TABLET | Freq: Once | ORAL | 0 refills | Status: AC

## 2017-03-05 MED ORDER — METFORMIN HCL 500 MG PO TABS: 1000.0000 mg | ORAL_TABLET | Freq: Two times a day (BID) | ORAL | 2 refills | Status: DC

## 2017-03-05 MED ORDER — IBUPROFEN 400 MG PO TABS: 600.0000 mg | ORAL_TABLET | Freq: Three times a day (TID) | ORAL | 0 refills | Status: DC | PRN

## 2017-03-05 MED FILL — IBUPROFEN 400 MG TABLET: 400 | 6 days supply | Qty: 30 | Fill #0

## 2017-03-05 MED FILL — glipiZIDE 5 MG TABS: 5 | 30 days supply | Qty: 30 | Fill #0

## 2017-03-05 MED FILL — FLUCONAZOLE 150 MG TABLET: 150 | 1 days supply | Qty: 1 | Fill #0

## 2017-03-05 NOTE — Patient Instructions (Addendum)
Start check blood sugars twice a day, am and pm.   Diabetes Mellitus and Food It is important for you to manage your blood sugar (glucose) level. Your blood glucose level can be greatly affected by what you eat. Eating healthier foods in the appropriate amounts throughout the day at about the same time each day will help you control your blood glucose level. It can also help slow or prevent worsening of your diabetes mellitus. Healthy eating may even help you improve the level of your blood pressure and reach or maintain a healthy weight. General recommendations for healthful eating and cooking habits include:  Eating meals and snacks regularly. Avoid going long periods of time without eating to lose weight.  Eating a diet that consists mainly of plant-based foods, such as fruits, vegetables, nuts, legumes, and whole grains.  Using low-heat cooking methods, such as baking, instead of high-heat cooking methods, such as deep frying. Work with your dietitian to make sure you understand how to use the Nutrition Facts information on food labels. How can food affect me? Carbohydrates  Carbohydrates affect your blood glucose level more than any other type of food. Your dietitian will help you determine how many carbohydrates to eat at each meal and teach you how to count carbohydrates. Counting carbohydrates is important to keep your blood glucose at a healthy level, especially if you are using insulin or taking certain medicines for diabetes mellitus. Alcohol  Alcohol can cause sudden decreases in blood glucose (hypoglycemia), especially if you use insulin or take certain medicines for diabetes mellitus. Hypoglycemia can be a life-threatening condition. Symptoms of hypoglycemia (sleepiness, dizziness, and disorientation) are similar to symptoms of having too much alcohol. If your health care provider has given you approval to drink alcohol, do so in moderation and use the following guidelines:  Women  should not have more than one drink per day, and men should not have more than two drinks per day. One drink is equal to:  12 oz of beer.  5 oz of wine.  1 oz of hard liquor.  Do not drink on an empty stomach.  Keep yourself hydrated. Have water, diet soda, or unsweetened iced tea.  Regular soda, juice, and other mixers might contain a lot of carbohydrates and should be counted. What foods are not recommended? As you make food choices, it is important to remember that all foods are not the same. Some foods have fewer nutrients per serving than other foods, even though they might have the same number of calories or carbohydrates. It is difficult to get your body what it needs when you eat foods with fewer nutrients. Examples of foods that you should avoid that are high in calories and carbohydrates but low in nutrients include:  Trans fats (most processed foods list trans fats on the Nutrition Facts label).  Regular soda.  Juice.  Candy.  Sweets, such as cake, pie, doughnuts, and cookies.  Fried foods. What foods can I eat? Eat nutrient-rich foods, which will nourish your body and keep you healthy. The food you should eat also will depend on several factors, including:  The calories you need.  The medicines you take.  Your weight.  Your blood glucose level.  Your blood pressure level.  Your cholesterol level. You should eat a variety of foods, including:  Protein.  Lean cuts of meat.  Proteins low in saturated fats, such as fish, egg whites, and beans. Avoid processed meats.  Fruits and vegetables.  Fruits and vegetables  that may help control blood glucose levels, such as apples, mangoes, and yams.  Dairy products.  Choose fat-free or low-fat dairy products, such as milk, yogurt, and cheese.  Grains, bread, pasta, and rice.  Choose whole grain products, such as multigrain bread, whole oats, and brown rice. These foods may help control blood  pressure.  Fats.  Foods containing healthful fats, such as nuts, avocado, olive oil, canola oil, and fish. Does everyone with diabetes mellitus have the same meal plan? Because every person with diabetes mellitus is different, there is not one meal plan that works for everyone. It is very important that you meet with a dietitian who will help you create a meal plan that is just right for you. This information is not intended to replace advice given to you by your health care provider. Make sure you discuss any questions you have with your health care provider. Document Released: 07/05/2005 Document Revised: 03/15/2016 Document Reviewed: 09/04/2013 Elsevier Interactive Patient Education  2017 [place].  Glipizide tablets What is this medicine? GLIPIZIDE (GLIP i zide) helps to treat type 2 diabetes. Treatment is combined with diet and exercise. The medicine helps your body to use insulin better. This medicine may be used for other purposes; ask your health care provider or pharmacist if you have questions. COMMON BRAND NAME(S): Glucotrol What should I tell my health care provider before I take this medicine? They need to know if you have any of these conditions: -diabetic ketoacidosis -glucose-6-phosphate dehydrogenase deficiency -heart disease -kidney disease -liver disease -porphyria -severe infection or injury -thyroid disease -an unusual or allergic reaction to glipizide, sulfa drugs, other medicines, foods, dyes, or preservatives -pregnant or trying to get pregnant -breast-feeding How should I use this medicine? Take this medicine by mouth. Swallow with a drink of water. Do not take with food. Take it 30 minutes before a meal. Follow the directions on the prescription label. If you take this medicine once a day, take it 30 minutes before breakfast. Take your doses at the same time each day. Do not take more often than directed. Talk to your pediatrician regarding the use of  this medicine in children. Special care may be needed. Elderly patients over 44 years old may have a stronger reaction and need a smaller dose. Overdosage: If you think you have taken too much of this medicine contact a poison control center or emergency room at once. NOTE: This medicine is only for you. Do not share this medicine with others. What if I miss a dose? If you miss a dose, take it as soon as you can. If it is almost time for your next dose, take only that dose. Do not take double or extra doses. What may interact with this medicine? -bosentan -chloramphenicol -cisapride -clarithromycin -medicines for fungal or yeast infections -metoclopramide -probenecid -warfarin Many medications may cause an increase or decrease in blood sugar, these include: -alcohol containing beverages -aspirin and aspirin-like drugs -chloramphenicol -chromium -diuretics -female hormones, like estrogens or progestins and birth control pills -heart medicines -isoniazid -female hormones or anabolic steroids -medicines for weight loss -medicines for allergies, asthma, cold, or cough -medicines for mental problems -medicines called MAO Inhibitors like Nardil, Parnate, Marplan, Eldepryl -niacin -NSAIDs, medicines for pain and inflammation, like ibuprofen or naproxen -pentamidine -phenytoin -probenecid -quinolone antibiotics like ciprofloxacin, levofloxacin, ofloxacin -some herbal dietary supplements -steroid medicines like prednisone or cortisone -thyroid medicine This list may not describe all possible interactions. Give your health care provider a list of all the  medicines, herbs, non-prescription drugs, or dietary supplements you use. Also tell them if you smoke, drink alcohol, or use illegal drugs. Some items may interact with your medicine. What should I watch for while using this medicine? Visit your doctor or health care professional for regular checks on your progress. A test called the  HbA1C (A1C) will be monitored. This is a simple blood test. It measures your blood sugar control over the last 2 to 3 months. You will receive this test every 3 to 6 months. Learn how to check your blood sugar. Learn the symptoms of low and high blood sugar and how to manage them. Always carry a quick-source of sugar with you in case you have symptoms of low blood sugar. Examples include hard sugar candy or glucose tablets. Make sure others know that you can choke if you eat or drink when you develop serious symptoms of low blood sugar, such as seizures or unconsciousness. They must get medical help at once. Tell your doctor or health care professional if you have high blood sugar. You might need to change the dose of your medicine. If you are sick or exercising more than usual, you might need to change the dose of your medicine. Do not skip meals. Ask your doctor or health care professional if you should avoid alcohol. Many nonprescription cough and cold products contain sugar or alcohol. These can affect blood sugar. This medicine can make you more sensitive to the sun. Keep out of the sun. If you cannot avoid being in the sun, wear protective clothing and use sunscreen. Do not use sun lamps or tanning beds/booths. Wear a medical ID bracelet or chain, and carry a card that describes your disease and details of your medicine and dosage times. What side effects may I notice from receiving this medicine? Side effects that you should report to your doctor or health care professional as soon as possible: -allergic reactions like skin rash, itching or hives, swelling of the face, lips, or tongue -breathing problems -dark urine -fever, chills, sore throat -signs and symptoms of low blood sugar such as feeling anxious, confusion, dizziness, increased hunger, unusually weak or tired, sweating, shakiness, cold, irritable, headache, blurred vision, fast heartbeat, loss of consciousness -unusual bleeding or  bruising -yellowing of the eyes or skin Side effects that usually do not require medical attention (report to your doctor or health care professional if they continue or are bothersome): -diarrhea -dizziness -headache -heartburn -nausea -stomach gas This list may not describe all possible side effects. Call your doctor for medical advice about side effects. You may report side effects to FDA at 1-800-FDA-1088. Where should I keep my medicine? Keep out of the reach of children. Store at room temperature below 30 degrees C (86 degrees F). Throw away any unused medicine after the expiration date. NOTE: This sheet is a summary. It may not cover all possible information. If you have questions about this medicine, talk to your doctor, pharmacist, or health care provider.  2018 Elsevier/Gold Standard (2013-01-21 14:42:46)  Labetalol tablets What is this medicine? LABETALOL (la BET a lole) is a beta-blocker. Beta-blockers reduce the workload on the heart and help it to beat more regularly. This medicine is used to treat high blood pressure. This medicine may be used for other purposes; ask your health care provider or pharmacist if you have questions. COMMON BRAND NAME(S): Normodyne, Trandate What should I tell my health care provider before I take this medicine? They need to know if you  have any of these conditions: -diabetes -history of heart attack, heart disease or heart failure -kidney disease -liver disease -lung or breathing disease, like asthma or emphysema -pheochromocytoma -thyroid disease -an unusual or allergic reaction to labetalol, other beta-blockers, medicines, foods, dyes, or preservatives -pregnant or trying to get pregnant -breast-feeding How should I use this medicine? Take this medicine by mouth with a glass of water. Follow the directions on the prescription label. Take your doses at regular intervals. Do not take your medicine more often than directed. Do not stop  taking this medicine suddenly. This could lead to serious heart-related effects. Talk to your pediatrician regarding the use of this medicine in children. Special care may be needed. Overdosage: If you think you have taken too much of this medicine contact a poison control center or emergency room at once. NOTE: This medicine is only for you. Do not share this medicine with others. What if I miss a dose? If you miss a dose, take it as soon as you can. If it is almost time for your next dose, take only that dose. Do not take double or extra doses. What may interact with this medicine? This medicine also interact with the following medications: -certain medicines for blood pressure, heart disease, irregular heart beat -cimetidine -general anesthetics -medicines for asthma or lung disease like albuterol -medicines for depression -nitroglycerin This list may not describe all possible interactions. Give your health care provider a list of all the medicines, herbs, non-prescription drugs, or dietary supplements you use. Also tell them if you smoke, drink alcohol, or use illegal drugs. Some items may interact with your medicine. What should I watch for while using this medicine? Visit your doctor or health care professional for regular check ups. Check your blood pressure and pulse rate regularly. Ask your health care professional what your blood pressure and pulse rate should be, and when you should contact him or her. You may get drowsy or dizzy. Do not drive, use machinery, or do anything that needs mental alertness until you know how this medicine affects you. Do not stand or sit up quickly. Alcohol may interfere with the effect of this medicine. Avoid alcoholic drinks. This medicine can affect blood sugar levels. If you have diabetes, check with your doctor or health care professional before you change your diet or the dose of your diabetic medicine. Do not treat yourself for coughs, colds, or pain  while you are taking this medicine without asking your doctor or health care professional for advice. Some ingredients may increase your blood pressure. What side effects may I notice from receiving this medicine? Side effects that you should report to your doctor or health care professional as soon as possible: -allergic reactions like skin rash, itching or hives, swelling of the face, lips, or tongue -breathing problems -cold hands or feet -dark urine -depression -general ill feeling or flu-like symptoms -irregular heartbeat -light-colored stools -loss of appetite, nausea -pain or trouble passing urine -right upper belly pain -slow heart rate (fewer than recommended by your doctor or health care professional) -swollen legs or ankles -tingling of the scalp or skin -unusually weak or tired -vomiting -yellowing of the eyes or skin Side effects that usually do not require medical attention (report to your doctor or health care professional if they continue or are bothersome): -decreased sexual function or desire -dry itching skin -headache -tiredness This list may not describe all possible side effects. Call your doctor for medical advice about side effects. You may  report side effects to FDA at 1-800-FDA-1088. Where should I keep my medicine? Keep out of the reach of children. Store at room temperature between 15 and 30 degrees C (59 and 86 degrees F). Protect from light. Keep container tightly closed. Throw away any unused medicine after the expiration date. NOTE: This sheet is a summary. It may not cover all possible information. If you have questions about this medicine, talk to your doctor, pharmacist, or health care provider.  2018 Elsevier/Gold Standard (2013-06-12 14:34:23)

## 2017-03-05 NOTE — Progress Notes (Signed)
Subjective:  Patient ID: Rebecca [name], female    DOB: [birth year]  Age: 50 y.o. MRN: [id number]  CC: Diabetes   HPI Rebecca [name] presents for  Diabetes Mellitus: Patient presents for follow up of diabetes. Symptoms: hyperglycemia. Symptoms have been basically asymptomatic. Patient denies foot ulcerations, nausea, paresthesia of the feet, visual disturbances and vomitting.  Evaluation to date has been included: fasting blood sugar, fasting lipid panel, hemoglobin A1C and microalbuminuria.  Home sugars: BGs are running  consistent with Hgb A1C. Treatment to date: Continued sulfonylurea which has been effective and Continued metformin which has been effective.   Hypertension: Patient here for follow-up of elevated blood pressure. She is not exercising and is adherent to low salt diet.  Blood pressure She does not check BP at home.   Cardiac symptoms none. Patient denies none.  Cardiovascular risk factors: diabetes mellitus, dyslipidemia, hypertension and sedentary lifestyle. Use of agents associated with hypertension: NSAIDS. History of target organ damage: none.  Vaginitis: Patient complains of an abnormal vaginal discharge for a few days. Vaginal symptoms include discharge described as white and curd-like and local irritation.Vulvar symptoms include burning, local irritation and vulvar itching. She denies any vaginal lesions or dysuria. STI Risk: Very low risk of STD exposure. Reports 1 sexual partner within the last 3 months. She declines STD screening at this time. She agrees to testing for BV and yeast.   Low Back Pain: Patient complains of chronic low back pain.The patient first noted symptoms 29month ago. It was not related to no known injury. The pain is rated moderate, and is located at the lumbar area. The pain is described as aching and occurs intermittently. She denies symptoms have  been progressive. Symptoms are exacerbated by flexion. Factors which relieve the pain include  NSAIDs and rest. Other associated symptoms include no other symptoms. She denies any bowel or bladder incontinence or paresthesias.Treatment efforts have included rest, OTC NSAIDS and prescription NSAIDS, with relief.   Outpatient Medications Prior to Visit  Medication Sig Dispense Refill  . atorvastatin (LIPITOR) 20 MG tablet Take 1 tablet (20 mg total) by mouth daily at 6 PM. 30 tablet 2  . metFORMIN (GLUCOPHAGE) 500 MG tablet Take 2 tablets (1,000 mg total) by mouth 2 (two) times daily with a meal. 120 tablet 2  . acetaminophen (TYLENOL) 500 MG tablet Take 1,000 mg by mouth daily as needed for mild pain. Reported on 01/16/2016    . cephALEXin (KEFLEX) 500 MG capsule Take 1 capsule (500 mg total) by mouth 2 (two) times daily. (Patient not taking: Reported on 01/16/2016) 28 capsule 0  . cetirizine (ZYRTEC) 10 MG tablet Take 1 tablet (10 mg total) by mouth daily. 30 tablet 11  . naproxen (NAPROSYN) 500 MG tablet Take 1 tablet (500 mg total) by mouth 2 (two) times daily with a meal. 40 tablet 0  . naproxen (NAPROSYN) 500 MG tablet TAKE 1 TABLET BY MOUTH 2 TIMES DAILY WITH A MEAL. 40 tablet 0  . nystatin-triamcinolone ointment (MYCOLOG) Apply 1 application topically 2 (two) times daily. 30 g 0  . ibuprofen (ADVIL,MOTRIN) 200 MG tablet Take 400 mg by mouth daily as needed for mild pain.    Marland Kitchen lisinopril (PRINIVIL,ZESTRIL) 20 MG tablet Take 1 tablet (20 mg total) by mouth daily. 30 tablet 2  . pravastatin (PRAVACHOL) 40 MG tablet   2   No facility-administered medications prior to visit.     ROS Review of Systems  Constitutional: Negative.   Eyes: Negative.  Respiratory: Negative.   Cardiovascular: Negative.   Gastrointestinal: Negative.   Genitourinary: Positive for vaginal discharge.  Musculoskeletal: Positive for back pain.  Psychiatric/Behavioral: Negative.     Objective:  BP 134/86 (BP Location: Left Arm, Patient Position: Sitting, Cuff Size: Normal)   Pulse 64   Temp 97.3 F (36.3  C) (Oral)   Resp 18   Ht 5\' 3"  (1.6 m)   Wt 150 lb 3.2 oz (68.1 kg)   SpO2 97%   BMI 26.61 kg/m   BP/Weight 03/05/2017 11/22/2016 9/41/7408  Systolic BP 144 - 818  Diastolic BP 86 - 84  Wt. (Lbs) 150.2 156 156.8  BMI 26.61 27.63 27.78     Physical Exam  Constitutional: She is oriented to person, place, and time. She appears well-developed and well-nourished.  Eyes: Conjunctivae are normal. Pupils are equal, round, and reactive to light.  Neck: Normal range of motion. Neck supple. No JVD present.  Cardiovascular: Normal rate, regular rhythm, normal heart sounds and intact distal pulses.   Pulmonary/Chest: Effort normal and breath sounds normal.  Abdominal: Soft. Bowel sounds are normal.  Musculoskeletal: Normal range of motion.  Neurological: She is alert and oriented to person, place, and time. She has normal reflexes.  Skin: Skin is warm and dry.  Psychiatric: She has a normal mood and affect.  Nursing note and vitals reviewed.  Assessment & Plan:   Problem List Items Addressed This Visit      Other   Hyperlipidemia   Relevant Medications   labetalol (NORMODYNE) 100 MG tablet   Other Relevant Orders   Hepatic Function Panel (Completed)   Lipid Panel (Completed)   Back pain   Relevant Medications   ibuprofen (ADVIL,MOTRIN) 400 MG tablet    Other Visit Diagnoses    Uncontrolled type 2 diabetes mellitus with hyperglycemia, without long-term current use of insulin (HCC)    -  Primary   Relevant Medications   metFORMIN (GLUCOPHAGE) 500 MG tablet   glipiZIDE (GLUCOTROL) 5 MG tablet   Other Relevant Orders   POCT glycosylated hemoglobin (Hb A1C) (Completed)   Glucose (CBG) (Completed)   Vaginal discharge       Relevant Orders   Urine cytology ancillary only (Completed)   Essential hypertension       Relevant Medications   labetalol (NORMODYNE) 100 MG tablet   Hyperlipidemia associated with type 2 diabetes mellitus (HCC)       Relevant Medications   metFORMIN  (GLUCOPHAGE) 500 MG tablet   glipiZIDE (GLUCOTROL) 5 MG tablet   labetalol (NORMODYNE) 100 MG tablet      Meds ordered this encounter  Medications  . metFORMIN (GLUCOPHAGE) 500 MG tablet    Sig: Take 2 tablets (1,000 mg total) by mouth 2 (two) times daily with a meal.    Dispense:  120 tablet    Refill:  2    Order Specific Question:   Supervising Provider    Answer:   Tresa Garter [id number]  . glipiZIDE (GLUCOTROL) 5 MG tablet    Sig: Take 1 tablet (5 mg total) by mouth daily before breakfast.    Dispense:  30 tablet    Refill:  2    Order Specific Question:   Supervising Provider    Answer:   Tresa Garter [id number]  . fluconazole (DIFLUCAN) 150 MG tablet    Sig: Take 1 tablet (150 mg total) by mouth once.    Dispense:  1 tablet    Refill:  0  Order Specific Question:   Supervising Provider    Answer:   Tresa Garter [id number]  . ibuprofen (ADVIL,MOTRIN) 400 MG tablet    Sig: Take 1.5 tablets (600 mg total) by mouth every 8 (eight) hours as needed.    Dispense:  30 tablet    Refill:  0    Order Specific Question:   Supervising Provider    Answer:   Tresa Garter [id number]  . labetalol (NORMODYNE) 100 MG tablet    Sig: Take 1 tablet (100 mg total) by mouth 2 (two) times daily.    Dispense:  60 tablet    Refill:  2    Order Specific Question:   Supervising Provider    Answer:   Tresa Garter [id number]    Follow-up: Return in about 1 month (around 04/05/2017) for DM /HTN.   Alfonse Spruce FNP

## 2017-03-05 NOTE — Progress Notes (Signed)
Patient is here for f/up  Patient has not taking any medications for today  Patient has not eaten for today  Patient denies pain for today but had a concern that her period came early this month

## 2017-03-06 LAB — LIPID PANEL
Chol/HDL Ratio: 3.9 ratio (ref 0.0–4.4)
Cholesterol, Total: 152 mg/dL (ref 100–199)
HDL: 39 mg/dL — AB (ref >39)
LDL Calculated: 47 mg/dL (ref 0–99)
Triglycerides: 332 mg/dL — ABNORMAL HIGH (ref 0–149)
VLDL Cholesterol Cal: 66 mg/dL — ABNORMAL HIGH (ref 5–40)

## 2017-03-06 LAB — HEPATIC FUNCTION PANEL
ALT: 18 IU/L (ref 0–32)
AST: 21 IU/L (ref 0–40)
Albumin: 4.1 g/dL (ref 3.5–5.5)
Alkaline Phosphatase: 69 IU/L (ref 39–117)
BILIRUBIN TOTAL: 0.2 mg/dL (ref 0.0–1.2)
BILIRUBIN, DIRECT: 0.09 mg/dL (ref 0.00–0.40)
Total Protein: 6.6 g/dL (ref 6.0–8.5)

## 2017-03-07 MED FILL — TERCONAZOLE 0.4% VAG CREAM: 0.4 | 7 days supply | Qty: 45 | Fill #0

## 2017-03-08 LAB — URINE CYTOLOGY ANCILLARY ONLY
Bacterial vaginitis: POSITIVE — AB
Candida vaginitis: POSITIVE — AB

## 2017-03-11 ENCOUNTER — Telehealth: Payer: Self-pay | Admitting: Family Medicine

## 2017-03-11 ENCOUNTER — Other Ambulatory Visit: Payer: Self-pay | Admitting: Family Medicine

## 2017-03-11 DIAGNOSIS — N760 Acute vaginitis: Secondary | ICD-10-CM

## 2017-03-11 DIAGNOSIS — E1165 Type 2 diabetes mellitus with hyperglycemia: Secondary | ICD-10-CM

## 2017-03-11 DIAGNOSIS — E782 Mixed hyperlipidemia: Secondary | ICD-10-CM

## 2017-03-11 DIAGNOSIS — B9689 Other specified bacterial agents as the cause of diseases classified elsewhere: Secondary | ICD-10-CM

## 2017-03-11 DIAGNOSIS — E119 Type 2 diabetes mellitus without complications: Secondary | ICD-10-CM | POA: Insufficient documentation

## 2017-03-11 MED ORDER — ACCU-CHEK SOFT TOUCH LANCETS MISC: 12 refills | Status: DC

## 2017-03-11 MED ORDER — METRONIDAZOLE 500 MG PO TABS: 500.0000 mg | ORAL_TABLET | Freq: Two times a day (BID) | ORAL | 0 refills | Status: DC

## 2017-03-11 MED ORDER — TRUEPLUS LANCETS 28G MISC: 1.0000 | Freq: Once | 12 refills | Status: DC

## 2017-03-11 MED ORDER — GLUCOSE BLOOD VI STRP: ORAL_STRIP | 12 refills | Status: DC

## 2017-03-11 MED ORDER — ATORVASTATIN CALCIUM 40 MG PO TABS: 40.0000 mg | ORAL_TABLET | Freq: Every day | ORAL | 0 refills | Status: DC

## 2017-03-11 MED FILL — metroNIDAZOLE 500 MG TABS: 500 | 7 days supply | Qty: 14 | Fill #0

## 2017-03-11 MED FILL — ATORVASTATIN 40 MG TABLET: 40 | 30 days supply | Qty: 30 | Fill #0

## 2017-03-11 NOTE — Telephone Encounter (Signed)
Refill request for blood glucose strips and lancets sent to pharmacy on file.

## 2017-03-11 NOTE — Telephone Encounter (Signed)
PT came to the office requesting a refill for the Strip to check her sugar level, please sent it to the phramacy please follow up with PT

## 2017-03-12 NOTE — Telephone Encounter (Signed)
-----   Message from Alfonse Spruce, [place] sent at 03/11/2017  1:56 PM EDT ----- Urine test positive for BV and yeast. Complete diflucan pill that was prescribed for yeast. Bacterial vaginosis was positive. BV is caused by an overgrowth of germs in the vagina. You will be prescribed metronidazole. To reduce your risk of developing BV don't douche, don't use scented soap or sprays, and use protection during sexual intercourse. Lipid levels were elevated. This can increase your risk of heart disease your dose of atorvastatin will be increased. Recommend recheck in 3 months. Liver function normal Start eating a diet low in saturated fat. Limit your intake of fried foods, red meats, and whole milk.

## 2017-03-12 NOTE — Telephone Encounter (Signed)
CMA call patient regarding lab results & to let he know that we already sent her refill for lancets & strips to the pharmacy   Patient Verify DOB  Patient was aware and understood

## 2017-03-13 ENCOUNTER — Other Ambulatory Visit: Payer: Self-pay | Admitting: Family Medicine

## 2017-03-13 DIAGNOSIS — E1165 Type 2 diabetes mellitus with hyperglycemia: Secondary | ICD-10-CM

## 2017-03-13 MED ORDER — CONTOUR NEXT MONITOR W/DEVICE KIT: 1.0000 | PACK | Freq: Once | 0 refills | Status: AC

## 2017-03-13 MED ORDER — GLUCOSE BLOOD VI STRP: ORAL_STRIP | 11 refills | Status: DC

## 2017-03-13 MED ORDER — MICROLET LANCETS MISC: 1.0000 | Freq: Once | 11 refills | Status: AC

## 2017-04-04 MED FILL — glipiZIDE 5 MG TABS: 5 | 30 days supply | Qty: 30 | Fill #1

## 2017-04-04 MED FILL — ?METFORMIN HCL 500MG TABLET: 500 | 30 days supply | Qty: 120 | Fill #2

## 2017-04-04 MED FILL — ATORVASTATIN 40 MG TABLET: 40 | 30 days supply | Qty: 30 | Fill #1

## 2017-05-06 ENCOUNTER — Other Ambulatory Visit: Payer: Self-pay | Admitting: Family Medicine

## 2017-05-06 DIAGNOSIS — E1165 Type 2 diabetes mellitus with hyperglycemia: Secondary | ICD-10-CM

## 2017-05-06 MED FILL — ?GLIPIZIDE 5MG TABLET: 5 | 30 days supply | Qty: 30 | Fill #2

## 2017-05-06 MED FILL — ?ATORVASTATIN 40MG TABLET: 40 | 30 days supply | Qty: 30 | Fill #2

## 2017-05-06 MED FILL — ?METFORMIN HCL 500MG TABLET: 500 | 30 days supply | Qty: 120 | Fill #0

## 2017-06-06 ENCOUNTER — Other Ambulatory Visit: Payer: Self-pay | Admitting: Family Medicine

## 2017-06-06 DIAGNOSIS — E1165 Type 2 diabetes mellitus with hyperglycemia: Secondary | ICD-10-CM

## 2017-06-06 DIAGNOSIS — E782 Mixed hyperlipidemia: Secondary | ICD-10-CM

## 2017-06-06 MED FILL — glipiZIDE 5 MG TABS: 5 | 30 days supply | Qty: 30 | Fill #0

## 2017-06-06 MED FILL — ATORVASTATIN 40 MG TABLET: 40 | 30 days supply | Qty: 30 | Fill #0

## 2017-06-06 NOTE — Telephone Encounter (Signed)
CMA call regarding medication refill already sent to pharmacy   Patient did not answer but left a detailed message

## 2017-06-10 MED FILL — ?METFORMIN HCL 500MG TABLET: 500 | 30 days supply | Qty: 120 | Fill #1

## 2017-06-17 ENCOUNTER — Ambulatory Visit: Payer: Self-pay | Attending: Family Medicine

## 2017-06-26 ENCOUNTER — Ambulatory Visit: Payer: Self-pay | Attending: Family Medicine | Admitting: Family Medicine

## 2017-06-26 ENCOUNTER — Encounter: Payer: Self-pay | Admitting: Family Medicine

## 2017-06-26 VITALS — BP 101/68 | HR 72 | Temp 98.1°F | Resp 18 | Ht 63.0 in | Wt 156.0 lb

## 2017-06-26 DIAGNOSIS — E1165 Type 2 diabetes mellitus with hyperglycemia: Secondary | ICD-10-CM | POA: Insufficient documentation

## 2017-06-26 DIAGNOSIS — L700 Acne vulgaris: Secondary | ICD-10-CM

## 2017-06-26 DIAGNOSIS — E119 Type 2 diabetes mellitus without complications: Secondary | ICD-10-CM

## 2017-06-26 DIAGNOSIS — Z79899 Other long term (current) drug therapy: Secondary | ICD-10-CM | POA: Insufficient documentation

## 2017-06-26 DIAGNOSIS — Z7984 Long term (current) use of oral hypoglycemic drugs: Secondary | ICD-10-CM | POA: Insufficient documentation

## 2017-06-26 DIAGNOSIS — E785 Hyperlipidemia, unspecified: Secondary | ICD-10-CM | POA: Insufficient documentation

## 2017-06-26 DIAGNOSIS — I10 Essential (primary) hypertension: Secondary | ICD-10-CM | POA: Insufficient documentation

## 2017-06-26 DIAGNOSIS — Z789 Other specified health status: Secondary | ICD-10-CM

## 2017-06-26 LAB — GLUCOSE, POCT (MANUAL RESULT ENTRY): POC Glucose: 91 mg/dl (ref 70–99)

## 2017-06-26 LAB — POCT URINE PREGNANCY: PREG TEST UR: NEGATIVE

## 2017-06-26 LAB — POCT GLYCOSYLATED HEMOGLOBIN (HGB A1C): Hemoglobin A1C: 6.5

## 2017-06-26 MED ORDER — METFORMIN HCL 500 MG PO TABS: 1000.0000 mg | ORAL_TABLET | Freq: Two times a day (BID) | ORAL | 6 refills | Status: DC

## 2017-06-26 MED ORDER — DAPSONE 5 % EX GEL: CUTANEOUS | 1 refills | Status: DC

## 2017-06-26 MED ORDER — CETAPHIL GENTLE CLEANSER EX LIQD: 1.0000 "application " | Freq: Two times a day (BID) | CUTANEOUS | Status: DC

## 2017-06-26 MED ORDER — GLIPIZIDE 5 MG PO TABS: ORAL_TABLET | ORAL | 1 refills | Status: DC

## 2017-06-26 NOTE — Patient Instructions (Signed)
Acne  Acne is a skin problem that causes small, red bumps (pimples). Acne happens when the tiny holes in your skin (pores) get blocked. Your pores may become red, sore, and swollen. They may also become infected. Acne is a common skin problem. It is especially common in teenagers. Acne usually goes away over time.  Follow these instructions at home:  Good skin care is the most important thing you can do to treat your acne. Take care of your skin as told by your doctor. You may be told to do these things:  · Wash your skin gently at least two times each day. You should also wash your skin:  ? After you exercise.  ? Before you go to bed.  · Use mild soap.  · Use a water-based skin moisturizer after you wash your skin.  · Use a sunscreen or sunblock with SPF 30 or greater. This is very important if you are using acne medicines.  · Choose cosmetics that will not plug your oil glands (are noncomedogenic).    Medicines  · Take over-the-counter and prescription medicines only as told by your doctor.  · If you were prescribed an antibiotic medicine, apply or take it as told by your doctor. Do not stop using the antibiotic even if your acne improves.  General instructions  · Keep your hair clean and off of your face. Shampoo your hair regularly. If you have oily hair, you may need to wash it every day.  · Avoid leaning your chin or forehead on your hands.  · Avoid wearing tight headbands or hats.  · Avoid picking or squeezing your pimples. That can make your acne worse and cause scarring.  · Keep all follow-up visits as told by your doctor. This is important.  · Shave gently. Only shave when it is necessary.  · Keep a food journal. This can help you to see if any foods are linked with your acne.  Contact a doctor if:  · Your acne is not better after eight weeks.  · Your acne gets worse.  · You have a large area of skin that is red or tender.  · You think that you are having side effects from any acne medicine.  This  information is not intended to replace advice given to you by your health care provider. Make sure you discuss any questions you have with your health care provider.  Document Released: 09/27/2011 Document Revised: 03/15/2016 Document Reviewed: 12/15/2014  Elsevier Interactive Patient Education © 2018 Else[employer] Inc[employer]

## 2017-06-26 NOTE — Progress Notes (Signed)
Subjective:  Patient ID: Rebecca [name], female    DOB: January 12, 1966  Age: 51 y.o. MRN: [id number]  CC: Diabetes   HPI Rebecca [name] presents for  follow up of diabetes. Symptoms: hyperglycemia. Symptoms have been basically asymptomatic. Patient denies foot ulcerations, nausea, paresthesia of the feet, visual disturbances and vomiting.  Evaluation to date has been included: fasting blood sugar, fasting lipid panel, hemoglobin A1C and microalbuminuria.  Home sugars: BGs are running  consistent with Hgb A1C. CBG 90's-130's.Treatment to date: Continued sulfonylurea which has been effective and Continued metformin which has been effective. History of hypertension. She is adherent to low salt diet. She does not check BP at home.   Cardiac symptoms none. Patient denies none.  Cardiovascular risk factors: diabetes mellitus, dyslipidemia, hypertension and sedentary lifestyle. Use of agents associated with hypertension: NSAIDS. History of target organ damage: none. She reports sensation of movement to the suprapubic area. She denies any dysuria or discharge. LPM 06/25/17. She is trying conceive and reports seeing gynecologist specialist.   Outpatient Medications Prior to Visit  Medication Sig Dispense Refill  . acetaminophen (TYLENOL) 500 MG tablet Take 1,000 mg by mouth daily as needed for mild pain. Reported on 01/16/2016    . atorvastatin (LIPITOR) 40 MG tablet TAKE 1 TABLET BY MOUTH DAILY. 30 tablet 0  . cephALEXin (KEFLEX) 500 MG capsule Take 1 capsule (500 mg total) by mouth 2 (two) times daily. (Patient not taking: Reported on 01/16/2016) 28 capsule 0  . cetirizine (ZYRTEC) 10 MG tablet Take 1 tablet (10 mg total) by mouth daily. 30 tablet 11  . glucose blood test strip Use as instructed 100 each 11  . ibuprofen (ADVIL,MOTRIN) 400 MG tablet Take 1.5 tablets (600 mg total) by mouth every 8 (eight) hours as needed. 30 tablet 0  . labetalol (NORMODYNE) 100 MG tablet Take 1 tablet (100 mg total)  by mouth 2 (two) times daily. 60 tablet 2  . metroNIDAZOLE (FLAGYL) 500 MG tablet Take 1 tablet (500 mg total) by mouth 2 (two) times daily. 14 tablet 0  . naproxen (NAPROSYN) 500 MG tablet Take 1 tablet (500 mg total) by mouth 2 (two) times daily with a meal. 40 tablet 0  . naproxen (NAPROSYN) 500 MG tablet TAKE 1 TABLET BY MOUTH 2 TIMES DAILY WITH A MEAL. 40 tablet 0  . nystatin-triamcinolone ointment (MYCOLOG) Apply 1 application topically 2 (two) times daily. 30 g 0  . glipiZIDE (GLUCOTROL) 5 MG tablet TAKE 1 TABLET BY MOUTH DAILY BEFORE BREAKFAST. 30 tablet 0  . metFORMIN (GLUCOPHAGE) 500 MG tablet Take 2 tablets (1,000 mg total) by mouth 2 (two) times daily with a meal. 120 tablet 2   No facility-administered medications prior to visit.     ROS Review of Systems  Constitutional: Negative.   Eyes: Negative.   Respiratory: Negative.   Cardiovascular: Negative.   Gastrointestinal: Negative.   Endocrine: Negative.   Skin: Negative.   Neurological: Negative.    Objective:  BP 101/68 (BP Location: Left Arm, Patient Position: Sitting, Cuff Size: Normal)   Pulse 72   Temp 98.1 F (36.7 C) (Oral)   Resp 18   Ht 5\' 3"  (1.6 m)   Wt 156 lb (70.8 kg)   SpO2 98%   BMI 27.63 kg/m   BP/Weight 06/26/2017 03/09/8415 6/0/6301  Systolic BP 601 093 -  Diastolic BP 68 86 -  Wt. (Lbs) 156 150.2 156  BMI 27.63 26.61 27.63   Physical Exam  Constitutional: She appears  well-developed and well-nourished.  Eyes: Pupils are equal, round, and reactive to light. Conjunctivae are normal.  Neck: No JVD present.  Cardiovascular: Normal rate, regular rhythm, normal heart sounds and intact distal pulses.   Pulmonary/Chest: Effort normal and breath sounds normal.  Abdominal: Soft. Bowel sounds are normal. There is no tenderness.  Skin: Skin is warm and dry.  Scattered comodones to face.  Nursing note and vitals reviewed.  Diabetic Foot Exam - Simple   Simple Foot Form Diabetic Foot exam was  performed with the following findings:  Yes 06/26/2017  9:35 AM  Visual Inspection No deformities, no ulcerations, no other skin breakdown bilaterally:  Yes Sensation Testing Intact to touch and monofilament testing bilaterally:  Yes Pulse Check Posterior Tibialis and Dorsalis pulse intact bilaterally:  Yes Comments    Assessment & Plan:   Problem List Items Addressed This Visit      Endocrine   Diabetes mellitus type 2, controlled ([place]) - Primary   Relevant Medications   metFORMIN (GLUCOPHAGE) 500 MG tablet   glipiZIDE (GLUCOTROL) 5 MG tablet   Other Relevant Orders   Glucose (CBG) (Completed)   HgB A1c (Completed)   Ambulatory referral to Ophthalmology    Other Visit Diagnoses    Attempting to conceive       Based on hpi and past history will obtain urine preg.   Relevant Orders   POCT urine pregnancy (Completed)   Acne comedone       Relevant Medications   Soap & Cleansers (CETAPHIL GENTLE CLEANSER) LIQD   Dapsone 5 % topical gel      Meds ordered this encounter  Medications  . metFORMIN (GLUCOPHAGE) 500 MG tablet    Sig: Take 2 tablets (1,000 mg total) by mouth 2 (two) times daily with a meal.    Dispense:  120 tablet    Refill:  6    Order Specific Question:   Supervising Provider    Answer:   Tresa Garter [id number]  . glipiZIDE (GLUCOTROL) 5 MG tablet    Sig: TAKE 1 TABLET BY MOUTH DAILY BEFORE BREAKFAST.    Dispense:  90 tablet    Refill:  1    Order Specific Question:   Supervising Provider    Answer:   Tresa Garter [id number]  . Soap & Cleansers (CETAPHIL GENTLE CLEANSER) LIQD    Sig: Apply 1 application topically 2 (two) times daily. For face washing. Rinse off with warm water and pat dry.    Order Specific Question:   Supervising Provider    Answer:   Tresa Garter [id number]  . Dapsone 5 % topical gel    Sig: APPLY ONE PEA SIZED AMOUNT TO FACE TWICE A DAY.    Dispense:  30 g    Refill:  1    Order Specific Question:    Supervising Provider    Answer:   Tresa Garter [[id number]]    Follow-up: Return in about 3 months (around 09/25/2017) for Diabetes.   Alfonse Spruce FNP

## 2017-06-26 NOTE — Progress Notes (Signed)
Patient is here for f/up  

## 2017-07-10 ENCOUNTER — Other Ambulatory Visit: Payer: Self-pay | Admitting: Family Medicine

## 2017-07-10 DIAGNOSIS — E1165 Type 2 diabetes mellitus with hyperglycemia: Secondary | ICD-10-CM

## 2017-07-10 MED FILL — ?METFORMIN HCL 500MG TABLET: 500 | 30 days supply | Qty: 120 | Fill #0

## 2017-07-10 MED FILL — glipiZIDE 5 MG TABS: 5 | 30 days supply | Qty: 30 | Fill #0

## 2017-08-08 MED FILL — ?METFORMIN HCL 500MG TABLET: 500 | 30 days supply | Qty: 120 | Fill #1

## 2017-08-08 MED FILL — ?GLIPIZIDE 5MG TABLET: 5 | 30 days supply | Qty: 30 | Fill #1

## 2017-09-09 MED FILL — ?METFORMIN HCL 500MG TABLET: 500 | 30 days supply | Qty: 120 | Fill #2

## 2017-09-09 MED FILL — ?GLIPIZIDE 5MG TABLET: 5 | 30 days supply | Qty: 30 | Fill #2

## 2017-09-25 ENCOUNTER — Ambulatory Visit: Payer: Self-pay | Attending: Family Medicine | Admitting: Family Medicine

## 2017-09-25 ENCOUNTER — Other Ambulatory Visit: Payer: Self-pay | Admitting: Family Medicine

## 2017-09-25 ENCOUNTER — Encounter: Payer: Self-pay | Admitting: Family Medicine

## 2017-09-25 VITALS — BP 134/79 | HR 86 | Temp 98.0°F | Resp 18 | Ht 63.0 in | Wt 157.6 lb

## 2017-09-25 DIAGNOSIS — I10 Essential (primary) hypertension: Secondary | ICD-10-CM | POA: Insufficient documentation

## 2017-09-25 DIAGNOSIS — E1165 Type 2 diabetes mellitus with hyperglycemia: Secondary | ICD-10-CM

## 2017-09-25 DIAGNOSIS — L84 Corns and callosities: Secondary | ICD-10-CM

## 2017-09-25 DIAGNOSIS — E119 Type 2 diabetes mellitus without complications: Secondary | ICD-10-CM

## 2017-09-25 DIAGNOSIS — T3695XA Adverse effect of unspecified systemic antibiotic, initial encounter: Principal | ICD-10-CM

## 2017-09-25 DIAGNOSIS — Z791 Long term (current) use of non-steroidal anti-inflammatories (NSAID): Secondary | ICD-10-CM | POA: Insufficient documentation

## 2017-09-25 DIAGNOSIS — Z79899 Other long term (current) drug therapy: Secondary | ICD-10-CM | POA: Insufficient documentation

## 2017-09-25 DIAGNOSIS — E785 Hyperlipidemia, unspecified: Secondary | ICD-10-CM | POA: Insufficient documentation

## 2017-09-25 DIAGNOSIS — Z7984 Long term (current) use of oral hypoglycemic drugs: Secondary | ICD-10-CM | POA: Insufficient documentation

## 2017-09-25 DIAGNOSIS — N764 Abscess of vulva: Secondary | ICD-10-CM

## 2017-09-25 DIAGNOSIS — B379 Candidiasis, unspecified: Secondary | ICD-10-CM

## 2017-09-25 LAB — POCT GLYCOSYLATED HEMOGLOBIN (HGB A1C): HEMOGLOBIN A1C: 6.9

## 2017-09-25 LAB — GLUCOSE, POCT (MANUAL RESULT ENTRY): POC Glucose: 128 mg/dl — AB (ref 70–99)

## 2017-09-25 MED ORDER — FLUCONAZOLE 150 MG PO TABS: 150.0000 mg | ORAL_TABLET | Freq: Once | ORAL | 0 refills | Status: AC

## 2017-09-25 MED ORDER — LIDOCAINE 5 % EX OINT: 1.0000 "application " | TOPICAL_OINTMENT | Freq: Every day | CUTANEOUS | 0 refills | Status: DC | PRN

## 2017-09-25 MED ORDER — TRUEPLUS LANCETS 28G MISC: 1.0000 | Freq: Once | 11 refills | Status: AC

## 2017-09-25 MED ORDER — GLUCOSE BLOOD VI STRP: ORAL_STRIP | 11 refills | Status: DC

## 2017-09-25 MED ORDER — SULFAMETHOXAZOLE-TRIMETHOPRIM 800-160 MG PO TABS: 1.0000 | ORAL_TABLET | Freq: Two times a day (BID) | ORAL | 0 refills | Status: DC

## 2017-09-25 MED ORDER — TRUE METRIX METER W/DEVICE KIT: 1.0000 | PACK | Freq: Once | 0 refills | Status: AC

## 2017-09-25 MED FILL — FLUCONAZOLE 150 MG TABLET: 150 | 1 days supply | Qty: 1 | Fill #0

## 2017-09-25 MED FILL — TRUE METRIX TEST STRIP: 25 days supply | Qty: 100 | Fill #0

## 2017-09-25 MED FILL — SULFAMETHOXAZOLE-TMP DS TAB: 800-160 | 7 days supply | Qty: 14 | Fill #0

## 2017-09-25 MED FILL — LIDOCAINE 5 % OINT: 5 | 14 days supply | Qty: 35 | Fill #0

## 2017-09-25 MED FILL — !TRUE METRIX BLOOD GLUCOSE: 30 days supply | Qty: 1 | Fill #0

## 2017-09-25 MED FILL — TRUEplus LANCETS 28G MISC: 25 days supply | Qty: 100 | Fill #0

## 2017-09-25 NOTE — Progress Notes (Signed)
Subjective:  Patient ID: Rebecca [name], female    DOB: 1966/07/12  Age: 51 y.o. MRN: [id number]  CC: Diabetes   HPI Rebecca [name] presents for  follow up of diabetes. Symptoms: none.  Patient denies foot ulcerations, nausea, paresthesia of the feet, visual disturbances and vomiting.  Evaluation to date has been included: fasting blood sugar, fasting lipid panel, hemoglobin A1C and microalbuminuria.  Home sugars: She is not check BP at home. She request new meter and strips. Treatment to date: Continued sulfonylurea and metformin, and more attention to diet. History of hypertension. She is adherent to low salt diet. She does not check BP at home.   Cardiac symptoms none. Patient denies none.  Cardiovascular risk factors: diabetes mellitus, dyslipidemia, hypertension and sedentary lifestyle. Use of agents associated with hypertension: NSAIDS. Vaginal lesions: Onset 1 week ago. Location labia. Associated symptoms include pain. She reports running a fever first day of onset but none thereafter. She denies any drainage.  She complaints of callous to left medial foot. Associated symptoms include pain. Worsened with friction from shoes. She reports seeing podiatry in the past and recommendation for follow up if symptom didn't improve.   Outpatient Medications Prior to Visit  Medication Sig Dispense Refill  . acetaminophen (TYLENOL) 500 MG tablet Take 1,000 mg by mouth daily as needed for mild pain. Reported on 01/16/2016    . atorvastatin (LIPITOR) 40 MG tablet TAKE 1 TABLET BY MOUTH DAILY. 30 tablet 0  . cephALEXin (KEFLEX) 500 MG capsule Take 1 capsule (500 mg total) by mouth 2 (two) times daily. (Patient not taking: Reported on 01/16/2016) 28 capsule 0  . cetirizine (ZYRTEC) 10 MG tablet Take 1 tablet (10 mg total) by mouth daily. 30 tablet 11  . Dapsone 5 % topical gel APPLY ONE PEA SIZED AMOUNT TO FACE TWICE A DAY. 30 g 1  . glipiZIDE (GLUCOTROL) 5 MG tablet TAKE 1 TABLET BY MOUTH DAILY  BEFORE BREAKFAST. 90 tablet 1  . ibuprofen (ADVIL,MOTRIN) 400 MG tablet Take 1.5 tablets (600 mg total) by mouth every 8 (eight) hours as needed. 30 tablet 0  . labetalol (NORMODYNE) 100 MG tablet Take 1 tablet (100 mg total) by mouth 2 (two) times daily. 60 tablet 2  . metFORMIN (GLUCOPHAGE) 500 MG tablet Take 2 tablets (1,000 mg total) by mouth 2 (two) times daily with a meal. 120 tablet 6  . metroNIDAZOLE (FLAGYL) 500 MG tablet Take 1 tablet (500 mg total) by mouth 2 (two) times daily. 14 tablet 0  . naproxen (NAPROSYN) 500 MG tablet Take 1 tablet (500 mg total) by mouth 2 (two) times daily with a meal. 40 tablet 0  . naproxen (NAPROSYN) 500 MG tablet TAKE 1 TABLET BY MOUTH 2 TIMES DAILY WITH A MEAL. 40 tablet 0  . nystatin-triamcinolone ointment (MYCOLOG) Apply 1 application topically 2 (two) times daily. 30 g 0  . Soap & Cleansers (CETAPHIL GENTLE CLEANSER) LIQD Apply 1 application topically 2 (two) times daily. For face washing. Rinse off with warm water and pat dry.    Marland Kitchen glucose blood test strip Use as instructed 100 each 11   No facility-administered medications prior to visit.     ROS Review of Systems  Constitutional: Negative.   Eyes: Negative.   Respiratory: Negative.   Cardiovascular: Negative.   Gastrointestinal: Negative.   Endocrine: Negative.   Genitourinary:       Vaginal lesion  Skin:       Left foot callous  Neurological: Negative.  Objective:  BP 134/79 (BP Location: Left Arm, Patient Position: Sitting, Cuff Size: Normal)   Pulse 86   Temp 98 F (36.7 C) (Oral)   Resp 18   Ht 5' 3"  (1.6 m)   Wt 157 lb 9.6 oz (71.5 kg)   SpO2 100%   BMI 27.92 kg/m   BP/Weight 09/25/2017 06/26/2017 0/63/8685  Systolic BP 488 301 415  Diastolic BP 79 68 86  Wt. (Lbs) 157.6 156 150.2  BMI 27.92 27.63 26.61   Physical Exam  Constitutional: She appears well-developed and well-nourished.  Eyes: Conjunctivae are normal. Pupils are equal, round, and reactive to light.    Neck: No JVD present.  Cardiovascular: Normal rate, regular rhythm, normal heart sounds and intact distal pulses.  Pulmonary/Chest: Effort normal and breath sounds normal.  Abdominal: Soft. Bowel sounds are normal. There is no tenderness.  Genitourinary: There is lesion (skin abscess non-draining) on the right labia. There is lesion (skin abscess non-draining) on the left labia.  Skin: Skin is warm and dry.  Callous to left medial foot near the great toe.  Nursing note and vitals reviewed.   Assessment & Plan:   1. Type 2 diabetes mellitus without complication, without long-term current use of insulin ([facility]CC)  - Glucose (CBG) - HgB A1c - Blood Glucose Monitoring Suppl (TRUE METRIX METER) w/Device KIT; 1 Device by Does not apply route once for 1 dose.  Dispense: 1 kit; Refill: 0 - TRUEPLUS LANCETS 28G MISC; 1 kit by Does not apply route once for 1 dose.  Dispense: 100 each; Refill: 11 - glucose blood test strip; Use as instructed  Dispense: 100 each; Refill: 11 - Hemoglobin A1c  2. Abscess of labia majora Course of antibiotics and warm compress therapy, if symptoms don't improve referral to gynecology for I&D. Patient reports shaving/waxing area, recommend trimming with clipper instead, however if she wants to continue previous hair removal methods recommend washing with antibacterial based soap prior to hair removal. - sulfamethoxazole-trimethoprim (BACTRIM DS,SEPTRA DS) 800-160 MG tablet; Take 1 tablet by mouth 2 (two) times daily.  Dispense: 14 tablet; Refill: 0 - lidocaine (XYLOCAINE) 5 % ointment; Apply 1 application topically daily as needed. Apply to affected areas.  Dispense: 30 g; Refill: 0  3. Corns and callosities  - Ambulatory referral to Podiatry     Follow-up: Return in about 3 months (around 12/24/2017), or if symptoms worsen or fail to improve, for DM.   Alfonse Spruce FNP

## 2017-09-25 NOTE — Progress Notes (Signed)
Patient is here for f/up   Patient complains both foot pain   Patient complain bump on vaginal area

## 2017-09-25 NOTE — Patient Instructions (Addendum)
Bring glucometer and or blood glucose log to next office visit.  Skin Abscess A skin abscess is an infected area on or under your skin that contains pus and other material. An abscess can happen almost anywhere on your body. Some abscesses break open (rupture) on their own. Most continue to get worse unless they are treated. The infection can spread deeper into the body and into your blood, which can make you feel sick. Treatment usually involves draining the abscess. Follow these instructions at home: Abscess Care  If you have an abscess that has not drained, place a warm, clean, wet washcloth over the abscess several times a day. Do this as told by your doctor.  Follow instructions from your doctor about how to take care of your abscess. Make sure you: ? Cover the abscess with a bandage (dressing). ? Change your bandage or gauze as told by your doctor. ? Wash your hands with soap and water before you change the bandage or gauze. If you cannot use soap and water, use hand sanitizer.  Check your abscess every day for signs that the infection is getting worse. Check for: ? More redness, swelling, or pain. ? More fluid or blood. ? Warmth. ? More pus or a bad smell. Medicines   Take over-the-counter and prescription medicines only as told by your doctor.  If you were prescribed an antibiotic medicine, take it as told by your doctor. Do not stop taking the antibiotic even if you start to feel better. General instructions  To avoid spreading the infection: ? Do not share personal care items, towels, or hot tubs with others. ? Avoid making skin-to-skin contact with other people.  Keep all follow-up visits as told by your doctor. This is important. Contact a doctor if:  You have more redness, swelling, or pain around your abscess.  You have more fluid or blood coming from your abscess.  Your abscess feels warm when you touch it.  You have more pus or a bad smell coming from your  abscess.  You have a fever.  Your muscles ache.  You have chills.  You feel sick. Get help right away if:  You have very bad (severe) pain.  You see red streaks on your skin spreading away from the abscess. This information is not intended to replace advice given to you by your health care provider. Make sure you discuss any questions you have with your health care provider. Document Released: 03/26/2008 Document Revised: 06/03/2016 Document Reviewed: 08/17/2015 Elsevier Interactive Patient Education  2018 [employer].    Diabetes and Foot Care Diabetes may cause you to have problems because of poor blood supply (circulation) to your feet and legs. This may cause the skin on your feet to become thinner, break easier, and heal more slowly. Your skin may become dry, and the skin may peel and crack. You may also have nerve damage in your legs and feet causing decreased feeling in them. You may not notice minor injuries to your feet that could lead to infections or more serious problems. Taking care of your feet is one of the most important things you can do for yourself. Follow these instructions at home:  Wear shoes at all times, even in the house. Do not go barefoot. Bare feet are easily injured.  Check your feet daily for blisters, cuts, and redness. If you cannot see the bottom of your feet, use a mirror or ask someone for help.  Wash your feet with warm water (  do not use hot water) and mild soap. Then pat your feet and the areas between your toes until they are completely dry. Do not soak your feet as this can dry your skin.  Apply a moisturizing lotion or petroleum jelly (that does not contain alcohol and is unscented) to the skin on your feet and to dry, brittle toenails. Do not apply lotion between your toes.  Trim your toenails straight across. Do not dig under them or around the cuticle. File the edges of your nails with an emery board or nail file.  Do not cut corns or  calluses or try to remove them with medicine.  Wear clean socks or stockings every day. Make sure they are not too tight. Do not wear knee-high stockings since they may decrease blood flow to your legs.  Wear shoes that fit properly and have enough cushioning. To break in new shoes, wear them for just a few hours a day. This prevents you from injuring your feet. Always look in your shoes before you put them on to be sure there are no objects inside.  Do not cross your legs. This may decrease the blood flow to your feet.  If you find a minor scrape, cut, or break in the skin on your feet, keep it and the skin around it clean and dry. These areas may be cleansed with mild soap and water. Do not cleanse the area with peroxide, alcohol, or iodine.  When you remove an adhesive bandage, be sure not to damage the skin around it.  If you have a wound, look at it several times a day to make sure it is healing.  Do not use heating pads or hot water bottles. They may burn your skin. If you have lost feeling in your feet or legs, you may not know it is happening until it is too late.  Make sure your health care provider performs a complete foot exam at least annually or more often if you have foot problems. Report any cuts, sores, or bruises to your health care provider immediately. Contact a health care provider if:  You have an injury that is not healing.  You have cuts or breaks in the skin.  You have an ingrown nail.  You notice redness on your legs or feet.  You feel burning or tingling in your legs or feet.  You have pain or cramps in your legs and feet.  Your legs or feet are numb.  Your feet always feel cold. Get help right away if:  There is increasing redness, swelling, or pain in or around a wound.  There is a red line that goes up your leg.  Pus is coming from a wound.  You develop a fever or as directed by your health care provider.  You notice a bad smell coming from an  ulcer or wound. This information is not intended to replace advice given to you by your health care provider. Make sure you discuss any questions you have with your health care provider. Document Released: 10/05/2000 Document Revised: 03/15/2016 Document Reviewed: 03/17/2013 Elsevier Interactive Patient Education  2017 [employer].

## 2017-10-16 MED FILL — ?METFORMIN HCL 500MG TABLET: 500 | 30 days supply | Qty: 120 | Fill #3

## 2017-10-16 MED FILL — ?GLIPIZIDE 5MG TABLET: 5 | 30 days supply | Qty: 30 | Fill #3

## 2017-10-18 MED FILL — TRUE METRIX TEST STRIP: 30 days supply | Qty: 100 | Fill #0

## 2017-11-12 MED FILL — ?GLIPIZIDE 5MG TABLET: 5 | 30 days supply | Qty: 30 | Fill #4

## 2017-11-12 MED FILL — ?METFORMIN HCL 500MG TABLET: 500 | 30 days supply | Qty: 120 | Fill #4

## 2017-12-12 MED FILL — metFORMIN HCL 500 MG TABS: 500 | 30 days supply | Qty: 120 | Fill #5

## 2017-12-12 MED FILL — glipiZIDE 5 MG TABS: 5 | 30 days supply | Qty: 30 | Fill #5

## 2017-12-16 ENCOUNTER — Encounter (HOSPITAL_COMMUNITY): Payer: Self-pay

## 2017-12-16 ENCOUNTER — Emergency Department (HOSPITAL_COMMUNITY): Payer: Self-pay

## 2017-12-16 ENCOUNTER — Other Ambulatory Visit: Payer: Self-pay

## 2017-12-16 ENCOUNTER — Emergency Department (HOSPITAL_COMMUNITY)
Admission: EM | Admit: 2017-12-16 | Discharge: 2017-12-16 | Disposition: A | Discharge status: Home / Self Care | Payer: Self-pay | Attending: Emergency Medicine | Admitting: Emergency Medicine

## 2017-12-16 DIAGNOSIS — J111 Influenza due to unidentified influenza virus with other respiratory manifestations: Secondary | ICD-10-CM | POA: Insufficient documentation

## 2017-12-16 DIAGNOSIS — Y929 Unspecified place or not applicable: Secondary | ICD-10-CM | POA: Insufficient documentation

## 2017-12-16 DIAGNOSIS — Z7722 Contact with and (suspected) exposure to environmental tobacco smoke (acute) (chronic): Secondary | ICD-10-CM | POA: Insufficient documentation

## 2017-12-16 DIAGNOSIS — X58XXXA Exposure to other specified factors, initial encounter: Secondary | ICD-10-CM | POA: Insufficient documentation

## 2017-12-16 DIAGNOSIS — Z79899 Other long term (current) drug therapy: Secondary | ICD-10-CM | POA: Insufficient documentation

## 2017-12-16 DIAGNOSIS — Z7984 Long term (current) use of oral hypoglycemic drugs: Secondary | ICD-10-CM | POA: Insufficient documentation

## 2017-12-16 DIAGNOSIS — Y939 Activity, unspecified: Secondary | ICD-10-CM | POA: Insufficient documentation

## 2017-12-16 DIAGNOSIS — Y999 Unspecified external cause status: Secondary | ICD-10-CM | POA: Insufficient documentation

## 2017-12-16 DIAGNOSIS — S6391XA Sprain of unspecified part of right wrist and hand, initial encounter: Secondary | ICD-10-CM | POA: Insufficient documentation

## 2017-12-16 DIAGNOSIS — E119 Type 2 diabetes mellitus without complications: Secondary | ICD-10-CM | POA: Insufficient documentation

## 2017-12-16 DIAGNOSIS — R69 Illness, unspecified: Secondary | ICD-10-CM

## 2017-12-16 IMAGING — DX DG HAND COMPLETE 3+V*R*
3 series · 3 of 3 positions shown · non-contrast
Comparison: Right thumb films [DATE].

CLINICAL DATA: 51-year-old female with pain and swelling right
fourth finger after picking up bucket 2 weeks ago. Initial
encounter.

EXAM:
RIGHT HAND - COMPLETE 3+ VIEW

[hand pa]
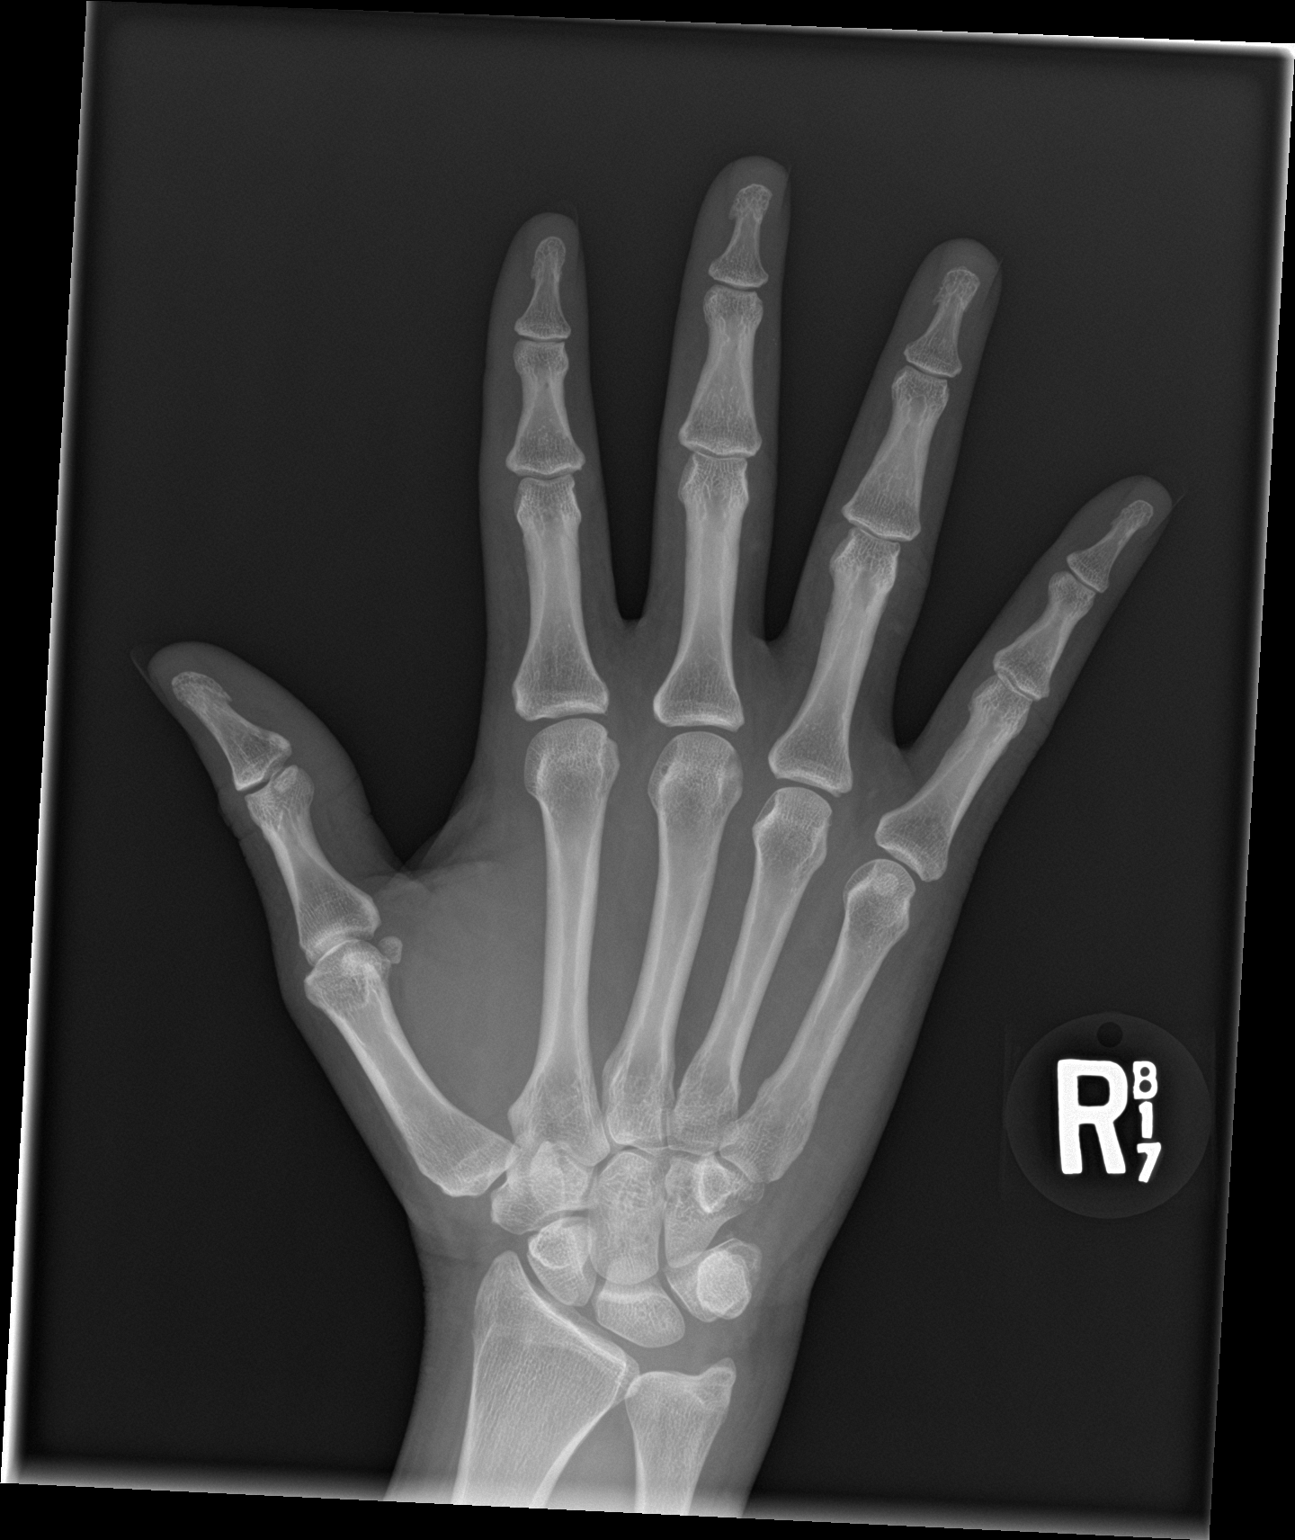

[hand obl]
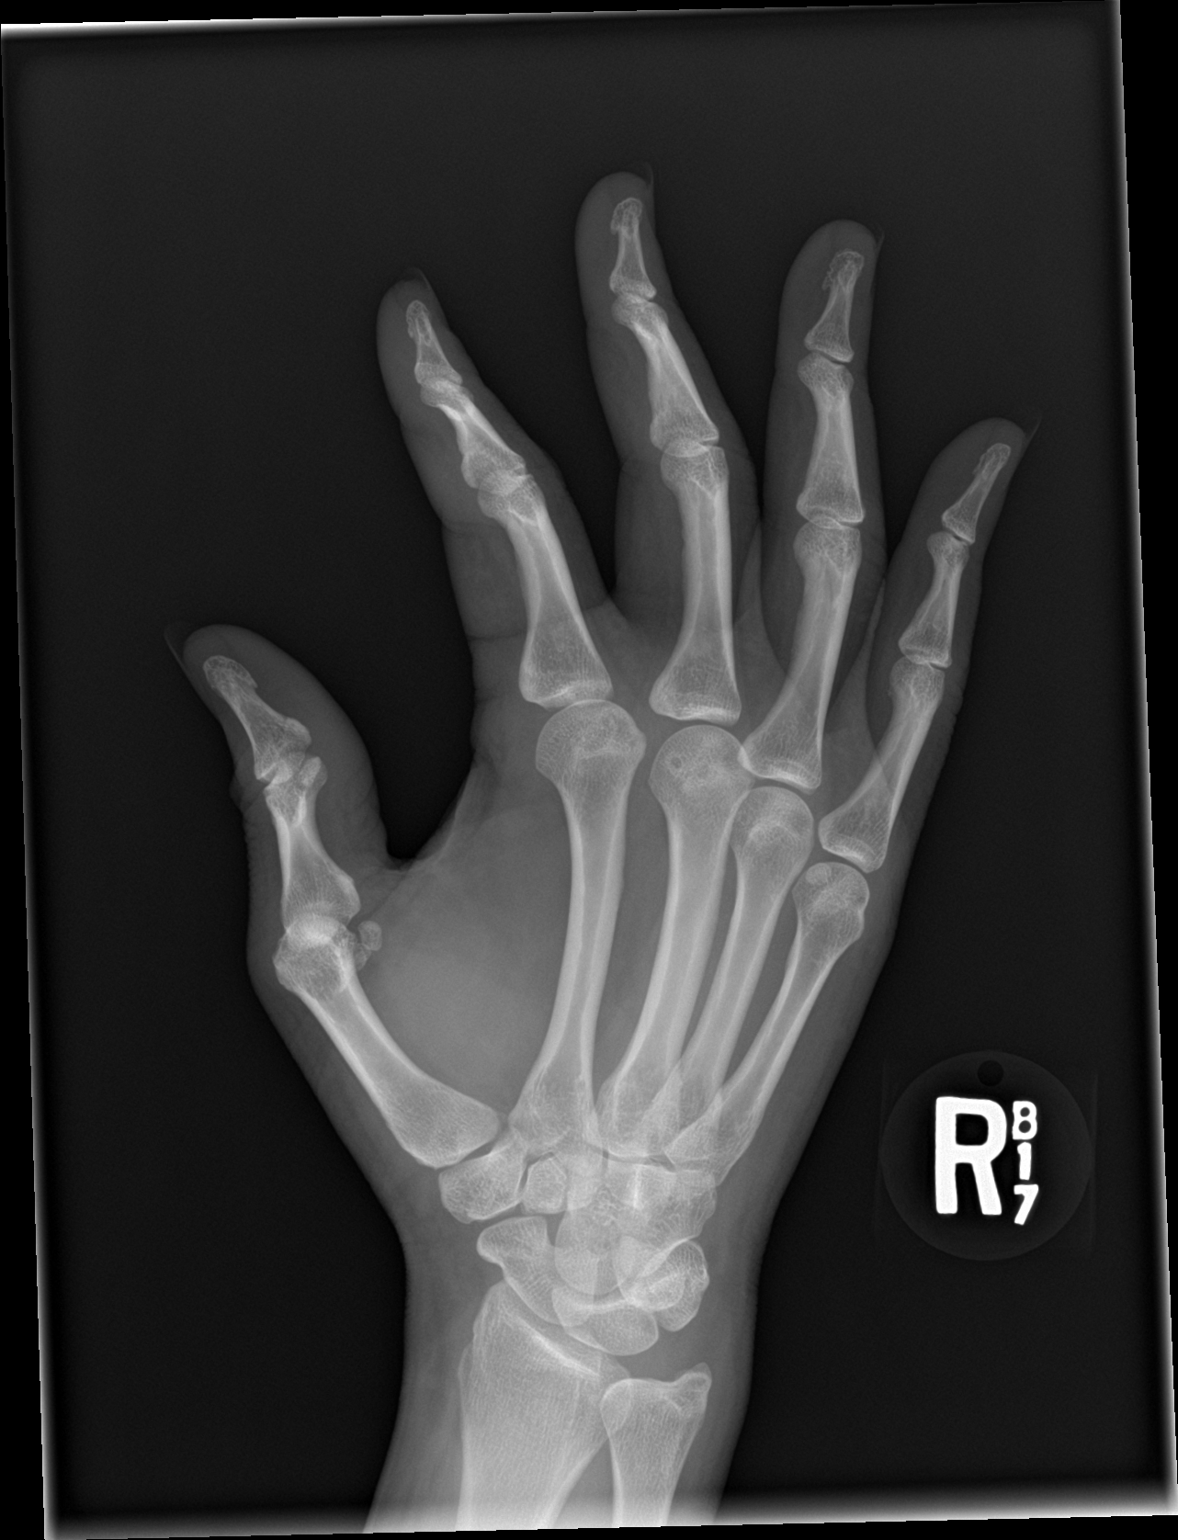

[hand lat]
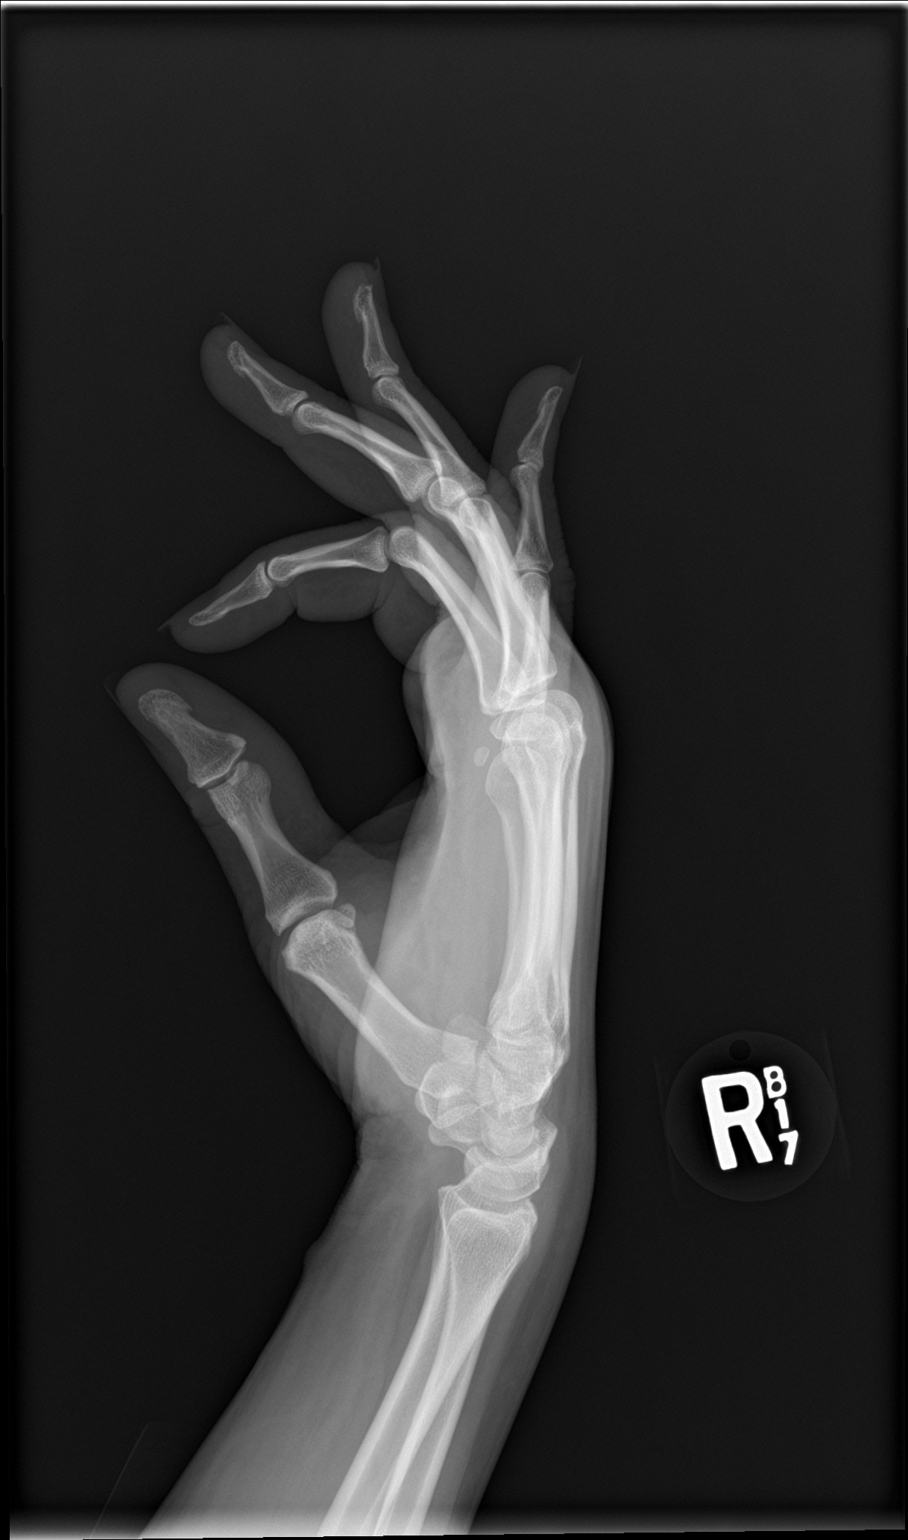

[3 of 3 positions shown; findings below may reference images not displayed]

FINDINGS: There is no evidence of fracture or dislocation. There is no
evidence of arthropathy or other focal bone abnormality. Soft
tissues are unremarkable.
IMPRESSION: Negative.

## 2017-12-16 MED ORDER — IBUPROFEN 800 MG PO TABS: 800.0000 mg | ORAL_TABLET | Freq: Three times a day (TID) | ORAL | 0 refills | Status: DC

## 2017-12-16 MED ORDER — IBUPROFEN 800 MG PO TABS
800.0000 mg | ORAL_TABLET | Freq: Once | ORAL | Status: AC
  Administered 2017-12-16: 800 mg via ORAL
  Filled 2017-12-16: qty 1

## 2017-12-16 MED ORDER — BENZONATATE 100 MG PO CAPS: 100.0000 mg | ORAL_CAPSULE | Freq: Three times a day (TID) | ORAL | 0 refills | Status: DC

## 2017-12-16 MED ORDER — BENZONATATE 100 MG PO CAPS
100.0000 mg | ORAL_CAPSULE | Freq: Once | ORAL | Status: AC
  Administered 2017-12-16: 100 mg via ORAL
  Filled 2017-12-16: qty 1

## 2017-12-16 MED FILL — BENZONATATE 100 MG CAP: 100 | 7 days supply | Qty: 21 | Fill #0

## 2017-12-16 NOTE — ED Notes (Signed)
ED Provider at bedside. 

## 2017-12-16 NOTE — ED Notes (Signed)
On way to XR 

## 2017-12-16 NOTE — ED Triage Notes (Signed)
Pt presents to the ed with complaints of flu like symptoms since Friday; body aches, headache, cough and congestion. Denies any other symptoms, NAD in triage.

## 2017-12-16 NOTE — ED Provider Notes (Signed)
[facility] Provider Note   CSN: [id number] Arrival date & time: 12/16/17  0907     History   Chief Complaint Chief Complaint  Patient presents with  . Influenza    HPI Rebecca [name] is a 52 y.o. female.  HPI   52 year old female with history of diabetes, hyperlipidemia, presenting with flulike symptoms.  Patient report for the past 4 days she has had fever, chills, headache, sinus congestion, nonproductive cough, decrease in appetite and generalized weakness.  Body aches slightly improving but symptom has not fully resolved.  She endorsed decrease in appetite and having been eating and drinking much.  She has tried over-the-counter medication with minimal improvement.  She did not have a flu shot this year.  She denies any recent sick contact or anyone else at home with similar sickness.  She has history of diabetes but unsure of her last blood sugars.  She is not on insulin.  She has been compliant with her metformin and glipizide.  Furthermore, she also complaining of pain to her right hand.  Pain is along the ring finger down towards the hand and described as a throbbing sensation, worse with prolonged use and has been ongoing for the past 3 weeks.  She denies any specific injury, denies any numbness, or wrist pain. She did remember picking up some buckets with the affected hand but unsure if that's what caused her pain. Upon Further evaluation of her symptoms, she does admits to doing repetitive movement at work.  She is left-hand dominant.  Past Medical History:  Diagnosis Date  . Arthritis   . Diabetes mellitus   . Hyperlipidemia     Patient Active Problem List   Diagnosis Date Noted  . Diabetes ([place]) 03/11/2017  . Muscle pain 08/06/2013  . Tiredness 08/06/2013  . Candida vaginitis 08/19/2012  . Dental cavities 08/19/2012  . Knee pain, bilateral 04/16/2012  . Diabetes mellitus type 2, controlled ([place]) 04/16/2012  .  Hyperlipidemia 04/16/2012  . Palpitations 04/16/2012  . Back pain 04/16/2012    Past Surgical History:  Procedure Laterality Date  . CESAREAN SECTION      OB History    No data available       Home Medications    Prior to Admission medications   Medication Sig Start Date End Date Taking? Authorizing Provider  acetaminophen (TYLENOL) 500 MG tablet Take 1,000 mg by mouth daily as needed for mild pain. Reported on 01/16/2016    [provider]  atorvastatin (LIPITOR) 40 MG tablet TAKE 1 TABLET BY MOUTH DAILY. 06/06/17   Alfonse Spruce, FNP  cephALEXin (KEFLEX) 500 MG capsule Take 1 capsule (500 mg total) by mouth 2 (two) times daily. Patient not taking: Reported on 01/16/2016 07/14/15   Margarita Mail, PA-C  cetirizine (ZYRTEC) 10 MG tablet Take 1 tablet (10 mg total) by mouth daily. 02/10/16   Micheline Chapman, NP  Dapsone 5 % topical gel APPLY ONE PEA SIZED AMOUNT TO FACE TWICE A DAY. 06/26/17   Hairston, Toy Baker R, FNP  glipiZIDE (GLUCOTROL) 5 MG tablet TAKE 1 TABLET BY MOUTH DAILY BEFORE BREAKFAST. 06/26/17   Fredia Beets R, FNP  glucose blood test strip Use as instructed 09/25/17   Alfonse Spruce, FNP  ibuprofen (ADVIL,MOTRIN) 400 MG tablet Take 1.5 tablets (600 mg total) by mouth every 8 (eight) hours as needed. 03/05/17   Alfonse Spruce, FNP  labetalol (NORMODYNE) 100 MG tablet Take 1 tablet (100 mg total)  by mouth 2 (two) times daily. 03/05/17   Alfonse Spruce, FNP  lidocaine (XYLOCAINE) 5 % ointment Apply 1 application topically daily as needed. Apply to affected areas. 09/25/17   Alfonse Spruce, FNP  metFORMIN (GLUCOPHAGE) 500 MG tablet Take 2 tablets (1,000 mg total) by mouth 2 (two) times daily with a meal. 06/26/17   Hairston, Maylon Peppers, FNP  metroNIDAZOLE (FLAGYL) 500 MG tablet Take 1 tablet (500 mg total) by mouth 2 (two) times daily. 03/11/17   Alfonse Spruce, FNP  naproxen (NAPROSYN) 500 MG tablet Take 1 tablet (500 mg total) by  mouth 2 (two) times daily with a meal. 11/22/16   Suzan Slick, NP  naproxen (NAPROSYN) 500 MG tablet TAKE 1 TABLET BY MOUTH 2 TIMES DAILY WITH A MEAL. 12/20/16   Alfonse Spruce, FNP  nystatin-triamcinolone ointment (MYCOLOG) Apply 1 application topically 2 (two) times daily. 02/10/16   Micheline Chapman, NP  Soap & Cleansers (CETAPHIL GENTLE CLEANSER) LIQD Apply 1 application topically 2 (two) times daily. For face washing. Rinse off with warm water and pat dry. 06/26/17   Alfonse Spruce, FNP  sulfamethoxazole-trimethoprim (BACTRIM DS,SEPTRA DS) 800-160 MG tablet Take 1 tablet by mouth 2 (two) times daily. 09/25/17   Alfonse Spruce, FNP    Family History Family History  Problem Relation Age of Onset  . Diabetes Mother   . Heart disease Mother   . Diabetes Father   . Cancer Father 42       Throat  . Diabetes Sister   . Diabetes Brother     Social History Social History   Tobacco Use  . Smoking status: Passive Smoke Exposure - Never Smoker  . Smokeless tobacco: Never Used  Substance Use Topics  . Alcohol use: No  . Drug use: No     Allergies   Patient has no known allergies.   Review of Systems Review of Systems  All other systems reviewed and are negative.    Physical Exam Updated Vital Signs BP (!) 160/98   Pulse (!) 116   Temp 98.9 F (37.2 C) (Oral)   Resp 19   Wt 71.2 kg (157 lb)   LMP 12/16/2017   SpO2 100%   BMI 27.81 kg/m   Physical Exam  Constitutional: She is oriented to person, place, and time. She appears well-developed and well-nourished. No distress.  Nontoxic in appearance  HENT:  Head: Atraumatic.  Right Ear: External ear normal.  Left Ear: External ear normal.  Nose: Nose normal.  Mouth/Throat: Oropharynx is clear and moist.  Eyes: Conjunctivae and EOM are normal. Pupils are equal, round, and reactive to light.  Neck: Normal range of motion. Neck supple.  No nuchal rigidity  Cardiovascular:  Tachycardia without murmur  rubs or gallops  Pulmonary/Chest: Effort normal and breath sounds normal. No stridor. No respiratory distress. She has no wheezes. She has no rales.  Abdominal: Soft. She exhibits no distension. There is no tenderness.  Musculoskeletal: She exhibits tenderness (Right hand: Tenderness along the ring finger and fourth metacarpal region without any obvious swelling or deformity.  Normal grip strength.  Ring finger with normal flexion extension at each joint.  Brisk cap refill.).  Right wrist nontender, radial pulse 2+.  Neurological: She is alert and oriented to person, place, and time.  Skin: No rash noted.  Psychiatric: She has a normal mood and affect.  Nursing note and vitals reviewed.    ED Treatments / Results  Labs (all labs  ordered are listed, but only abnormal results are displayed) Labs Reviewed - No data to display  EKG  EKG Interpretation None       Radiology Dg Hand Complete Right  Result Date: 12/16/2017 CLINICAL DATA:  52 year old female with pain and swelling right fourth finger after picking up bucket 2 weeks ago. Initial encounter. EXAM: RIGHT HAND - COMPLETE 3+ VIEW COMPARISON:  Right thumb films 07/14/2015. FINDINGS: There is no evidence of fracture or dislocation. There is no evidence of arthropathy or other focal bone abnormality. Soft tissues are unremarkable. IMPRESSION: Negative. Electronically Signed   By: Genia Del M.D.   On: 12/16/2017 15:18    Procedures Procedures (including critical care time)  Medications Ordered in ED Medications - No data to display   Initial Impression / Assessment and Plan / ED Course  I have reviewed the triage vital signs and the nursing notes.  Pertinent labs & imaging results that were available during my care of the patient were reviewed by me and considered in my medical decision making (see chart for details).     BP 120/78 (BP Location: Right Arm)   Pulse 100   Temp 98.6 F (37 C) (Oral)   Resp 18   Wt 71.2  kg (157 lb)   LMP 12/16/2017   SpO2 100%   BMI 27.81 kg/m    Final Clinical Impressions(s) / ED Diagnoses   Final diagnoses:  Influenza-like illness  Hand sprain, right, initial encounter    ED Discharge Orders        Ordered    ibuprofen (ADVIL,MOTRIN) 800 MG tablet  3 times daily     12/16/17 1531    benzonatate (TESSALON) 100 MG capsule  Every 8 hours     12/16/17 1531     2:02 PM Patient with symptoms consistent with influenza.  Vitals are stable, low-grade fever.  No signs of dehydration, tolerating PO's.  Lungs are clear. Due to patient's presentation and physical exam a chest x-ray was not ordered bc likely diagnosis of flu.  Discussed the cost versus benefit of Tamiflu treatment with the patient.  The patient understands that symptoms are greater than the recommended 24-48 hour window of treatment.  Patient will be discharged with instructions to orally hydrate, rest, and use over-the-counter medications such as anti-inflammatories ibuprofen and Aleve for muscle aches and Tylenol for fever.  Patient will also be given a cough suppressant.   Pt also report having atraumatic R hand pain x 3 weeks.  She requesting xray.  Will obtain xray.  No evidence to suggest infectious etiology.  Suspect tendinitis/trigger finger.   3:24 PM Xray of R hand is without acute finding.  Will provide ACE wrap and RICE therapy.  Pt d/c home with sxs treatment.  Return precaution given.     Domenic Moras, PA-C 12/16/17 1534    Dorie Rank, MD 12/17/17 [phone]

## 2017-12-27 ENCOUNTER — Ambulatory Visit: Payer: Self-pay | Attending: Family Medicine

## 2018-01-14 MED FILL — metFORMIN HCL 500 MG TABS: 500 | 30 days supply | Qty: 120 | Fill #6

## 2018-01-14 MED FILL — TRUE METRIX TEST STRIP: 25 days supply | Qty: 100 | Fill #1

## 2018-01-17 ENCOUNTER — Telehealth: Payer: Self-pay | Admitting: Family Medicine

## 2018-01-17 DIAGNOSIS — E119 Type 2 diabetes mellitus without complications: Secondary | ICD-10-CM

## 2018-01-17 MED ORDER — GLIPIZIDE 5 MG PO TABS: ORAL_TABLET | ORAL | 0 refills | Status: DC

## 2018-01-17 MED FILL — glipiZIDE 5 MG TABS: 5 | 30 days supply | Qty: 30 | Fill #0

## 2018-01-17 NOTE — Telephone Encounter (Signed)
Refilled x 30 days, needs OV

## 2018-01-17 NOTE — Telephone Encounter (Signed)
Pt called to request a medication refill on -glipiZIDE (GLUCOTROL) 5 MG tablet  Until her next appt To [facility] pharmacy Please follow up

## 2018-01-17 NOTE — Addendum Note (Signed)
Addended by: Rica Mast on: 01/17/2018 01:23 PM   Modules accepted: Orders

## 2018-02-25 MED FILL — ?METFORMIN HCL 500MG TABLET: 500 | 30 days supply | Qty: 120 | Fill #2

## 2018-02-27 ENCOUNTER — Ambulatory Visit: Payer: Self-pay | Attending: Family Medicine | Admitting: Family Medicine

## 2018-02-27 VITALS — BP 148/97 | HR 89 | Temp 97.6°F | Ht 63.0 in | Wt 158.0 lb

## 2018-02-27 DIAGNOSIS — I10 Essential (primary) hypertension: Secondary | ICD-10-CM | POA: Insufficient documentation

## 2018-02-27 DIAGNOSIS — L309 Dermatitis, unspecified: Secondary | ICD-10-CM | POA: Insufficient documentation

## 2018-02-27 DIAGNOSIS — Z791 Long term (current) use of non-steroidal anti-inflammatories (NSAID): Secondary | ICD-10-CM | POA: Insufficient documentation

## 2018-02-27 DIAGNOSIS — G8929 Other chronic pain: Secondary | ICD-10-CM | POA: Insufficient documentation

## 2018-02-27 DIAGNOSIS — E782 Mixed hyperlipidemia: Secondary | ICD-10-CM | POA: Insufficient documentation

## 2018-02-27 DIAGNOSIS — Z7984 Long term (current) use of oral hypoglycemic drugs: Secondary | ICD-10-CM | POA: Insufficient documentation

## 2018-02-27 DIAGNOSIS — E11649 Type 2 diabetes mellitus with hypoglycemia without coma: Secondary | ICD-10-CM | POA: Insufficient documentation

## 2018-02-27 DIAGNOSIS — Z79899 Other long term (current) drug therapy: Secondary | ICD-10-CM | POA: Insufficient documentation

## 2018-02-27 DIAGNOSIS — Z9889 Other specified postprocedural states: Secondary | ICD-10-CM | POA: Insufficient documentation

## 2018-02-27 DIAGNOSIS — E119 Type 2 diabetes mellitus without complications: Secondary | ICD-10-CM

## 2018-02-27 DIAGNOSIS — Z9114 Patient's other noncompliance with medication regimen: Secondary | ICD-10-CM | POA: Insufficient documentation

## 2018-02-27 DIAGNOSIS — M25561 Pain in right knee: Secondary | ICD-10-CM | POA: Insufficient documentation

## 2018-02-27 LAB — GLUCOSE, POCT (MANUAL RESULT ENTRY): POC GLUCOSE: 188 mg/dL — AB (ref 70–99)

## 2018-02-27 LAB — POCT GLYCOSYLATED HEMOGLOBIN (HGB A1C): HEMOGLOBIN A1C: 8.4

## 2018-02-27 MED ORDER — HYDROCORTISONE 0.5 % EX CREA: 1.0000 "application " | TOPICAL_CREAM | Freq: Two times a day (BID) | CUTANEOUS | 0 refills | Status: DC

## 2018-02-27 MED ORDER — METFORMIN HCL 500 MG PO TABS: 1000.0000 mg | ORAL_TABLET | Freq: Two times a day (BID) | ORAL | 6 refills | Status: DC

## 2018-02-27 MED ORDER — DAPAGLIFLOZIN PROPANEDIOL 10 MG PO TABS: 10.0000 mg | ORAL_TABLET | Freq: Every day | ORAL | 6 refills | Status: DC

## 2018-02-27 MED ORDER — ATORVASTATIN CALCIUM 40 MG PO TABS: 40.0000 mg | ORAL_TABLET | Freq: Every day | ORAL | 6 refills | Status: DC

## 2018-02-27 MED ORDER — IBUPROFEN 800 MG PO TABS: 800.0000 mg | ORAL_TABLET | Freq: Two times a day (BID) | ORAL | 1 refills | Status: DC | PRN

## 2018-02-27 MED ORDER — LABETALOL HCL 100 MG PO TABS: 100.0000 mg | ORAL_TABLET | Freq: Two times a day (BID) | ORAL | 6 refills | Status: DC

## 2018-02-27 MED FILL — IBUPROFEN 800 MG TABLET: 800 | 30 days supply | Qty: 60 | Fill #0

## 2018-02-27 MED FILL — ?ATORVASTATIN 40MG TABLET: 40 | 30 days supply | Qty: 30 | Fill #0

## 2018-02-27 MED FILL — TRUE METRIX TEST STRIP: 25 days supply | Qty: 100 | Fill #2

## 2018-02-27 MED FILL — FARXIGA 10 MG TABLET: 10 | 30 days supply | Qty: 30 | Fill #0

## 2018-02-27 NOTE — Patient Instructions (Signed)
Diabetes Mellitus and Nutrition When you have diabetes (diabetes mellitus), it is very important to have healthy eating habits because your blood sugar (glucose) levels are greatly affected by what you eat and drink. Eating healthy foods in the appropriate amounts, at about the same times every day, can help you:  Control your blood glucose.  Lower your risk of heart disease.  Improve your blood pressure.  Reach or maintain a healthy weight.  Every person with diabetes is different, and each person has different needs for a meal plan. Your health care provider may recommend that you work with a diet and nutrition specialist (dietitian) to make a meal plan that is best for you. Your meal plan may vary depending on factors such as:  The calories you need.  The medicines you take.  Your weight.  Your blood glucose, blood pressure, and cholesterol levels.  Your activity level.  Other health conditions you have, such as heart or kidney disease.  How do carbohydrates affect me? Carbohydrates affect your blood glucose level more than any other type of food. Eating carbohydrates naturally increases the amount of glucose in your blood. Carbohydrate counting is a method for keeping track of how many carbohydrates you eat. Counting carbohydrates is important to keep your blood glucose at a healthy level, especially if you use insulin or take certain oral diabetes medicines. It is important to know how many carbohydrates you can safely have in each meal. This is different for every person. Your dietitian can help you calculate how many carbohydrates you should have at each meal and for snack. Foods that contain carbohydrates include:  Bread, cereal, rice, pasta, and crackers.  Potatoes and corn.  Peas, beans, and lentils.  Milk and yogurt.  Fruit and juice.  Desserts, such as cakes, cookies, ice cream, and candy.  How does alcohol affect me? Alcohol can cause a sudden decrease in blood  glucose (hypoglycemia), especially if you use insulin or take certain oral diabetes medicines. Hypoglycemia can be a life-threatening condition. Symptoms of hypoglycemia (sleepiness, dizziness, and confusion) are similar to symptoms of having too much alcohol. If your health care provider says that alcohol is safe for you, follow these guidelines:  Limit alcohol intake to no more than 1 drink per day for nonpregnant women and 2 drinks per day for men. One drink equals 12 oz of beer, 5 oz of wine, or 1 oz of hard liquor.  Do not drink on an empty stomach.  Keep yourself hydrated with water, diet soda, or unsweetened iced tea.  Keep in mind that regular soda, juice, and other mixers may contain a lot of sugar and must be counted as carbohydrates.  What are tips for following this plan? Reading food labels  Start by checking the serving size on the label. The amount of calories, carbohydrates, fats, and other nutrients listed on the label are based on one serving of the food. Many foods contain more than one serving per package.  Check the total grams (g) of carbohydrates in one serving. You can calculate the number of servings of carbohydrates in one serving by dividing the total carbohydrates by 15. For example, if a food has 30 g of total carbohydrates, it would be equal to 2 servings of carbohydrates.  Check the number of grams (g) of saturated and trans fats in one serving. Choose foods that have low or no amount of these fats.  Check the number of milligrams (mg) of sodium in one serving. Most people   should limit total sodium intake to less than 2,300 mg per day.  Always check the nutrition information of foods labeled as "low-fat" or "nonfat". These foods may be higher in added sugar or refined carbohydrates and should be avoided.  Talk to your dietitian to identify your daily goals for nutrients listed on the label. Shopping  Avoid buying canned, premade, or processed foods. These  foods tend to be high in fat, sodium, and added sugar.  Shop around the outside edge of the grocery store. This includes fresh fruits and vegetables, bulk grains, fresh meats, and fresh dairy. Cooking  Use low-heat cooking methods, such as baking, instead of high-heat cooking methods like deep frying.  Cook using healthy oils, such as olive, canola, or sunflower oil.  Avoid cooking with butter, cream, or high-fat meats. Meal planning  Eat meals and snacks regularly, preferably at the same times every day. Avoid going long periods of time without eating.  Eat foods high in fiber, such as fresh fruits, vegetables, beans, and whole grains. Talk to your dietitian about how many servings of carbohydrates you can eat at each meal.  Eat 4-6 ounces of lean protein each day, such as lean meat, chicken, fish, eggs, or tofu. 1 ounce is equal to 1 ounce of meat, chicken, or fish, 1 egg, or 1/4 cup of tofu.  Eat some foods each day that contain healthy fats, such as avocado, nuts, seeds, and fish. Lifestyle   Check your blood glucose regularly.  Exercise at least 30 minutes 5 or more days each week, or as told by your health care provider.  Take medicines as told by your health care provider.  Do not use any products that contain nicotine or tobacco, such as cigarettes and e-cigarettes. If you need help quitting, ask your health care provider.  Work with a counselor or diabetes educator to identify strategies to manage stress and any emotional and social challenges. What are some questions to ask my health care provider?  Do I need to meet with a diabetes educator?  Do I need to meet with a dietitian?  What number can I call if I have questions?  When are the best times to check my blood glucose? Where to find more information:  American Diabetes Association: diabetes.org/food-and-fitness/food  Academy of Nutrition and Dietetics:  www.eatright.org/resources/health/diseases-and-conditions/diabetes  National Institute of Diabetes and Digestive and Kidney Diseases (NIH): www.niddk.nih.gov/health-information/diabetes/overview/diet-eating-physical-activity Summary  A healthy meal plan will help you control your blood glucose and maintain a healthy lifestyle.  Working with a diet and nutrition specialist (dietitian) can help you make a meal plan that is best for you.  Keep in mind that carbohydrates and alcohol have immediate effects on your blood glucose levels. It is important to count carbohydrates and to use alcohol carefully. This information is not intended to replace advice given to you by your health care provider. Make sure you discuss any questions you have with your health care provider. Document Released: 07/05/2005 Document Revised: 11/12/2016 Document Reviewed: 11/12/2016 Elsevier Interactive Patient Education  2018 [employer]  

## 2018-02-27 NOTE — Progress Notes (Signed)
Subjective:  Patient ID: Rebecca [name], female    DOB: April 17, 1966  Age: 52 y.o. MRN: [id number]  CC: Diabetes   HPI Nadalee A Faux is a 52 year old female with Type 2 DM (A1c 8.4), Hypertension, Hyperlipidemia here to establish care with me. Her A1c is 8.4 an has trended up from 6.9 and she informs me she has skipped her medications on some instances due to hypoglycemic episodes with random sugars in the 80s which she has treated with sugary substances. She denies visual concerns or neuropathy. She has been out of her medications for the last one week which explains her elevated blood pressure. Denies myalgias or other adverse effect with her statin. She complains of intermittent right knee pain for which she applies ice. Also noticed itching and peeling of the tips of her fingers after applying a herbal cosmetic product 'Henna" to her nail and has done so in the past with no prior reactions.  Past Medical History:  Diagnosis Date  . Arthritis   . Diabetes mellitus   . Hyperlipidemia     Past Surgical History:  Procedure Laterality Date  . CESAREAN SECTION      No Known Allergies   Outpatient Medications Prior to Visit  Medication Sig Dispense Refill  . cetirizine (ZYRTEC) 10 MG tablet Take 1 tablet (10 mg total) by mouth daily. 30 tablet 11  . Dapsone 5 % topical gel APPLY ONE PEA SIZED AMOUNT TO FACE TWICE A DAY. 30 g 1  . glucose blood test strip Use as instructed 100 each 11  . glipiZIDE (GLUCOTROL) 5 MG tablet TAKE 1 TABLET BY MOUTH DAILY BEFORE BREAKFAST. 30 tablet 0  . ibuprofen (ADVIL,MOTRIN) 800 MG tablet Take 1 tablet (800 mg total) by mouth 3 (three) times daily. 21 tablet 0  . metFORMIN (GLUCOPHAGE) 500 MG tablet Take 2 tablets (1,000 mg total) by mouth 2 (two) times daily with a meal. 120 tablet 6  . acetaminophen (TYLENOL) 500 MG tablet Take 1,000 mg by mouth daily as needed for mild pain. Reported on 01/16/2016    . lidocaine (XYLOCAINE) 5 % ointment  Apply 1 application topically daily as needed. Apply to affected areas. (Patient not taking: Reported on 02/27/2018) 30 g 0  . naproxen (NAPROSYN) 500 MG tablet Take 1 tablet (500 mg total) by mouth 2 (two) times daily with a meal. (Patient not taking: Reported on 02/27/2018) 40 tablet 0  . atorvastatin (LIPITOR) 40 MG tablet TAKE 1 TABLET BY MOUTH DAILY. (Patient not taking: Reported on 02/27/2018) 30 tablet 0  . benzonatate (TESSALON) 100 MG capsule Take 1 capsule (100 mg total) by mouth every 8 (eight) hours. (Patient not taking: Reported on 02/27/2018) 21 capsule 0  . cephALEXin (KEFLEX) 500 MG capsule Take 1 capsule (500 mg total) by mouth 2 (two) times daily. (Patient not taking: Reported on 01/16/2016) 28 capsule 0  . labetalol (NORMODYNE) 100 MG tablet Take 1 tablet (100 mg total) by mouth 2 (two) times daily. (Patient not taking: Reported on 02/27/2018) 60 tablet 2  . metroNIDAZOLE (FLAGYL) 500 MG tablet Take 1 tablet (500 mg total) by mouth 2 (two) times daily. (Patient not taking: Reported on 02/27/2018) 14 tablet 0  . naproxen (NAPROSYN) 500 MG tablet TAKE 1 TABLET BY MOUTH 2 TIMES DAILY WITH A MEAL. (Patient not taking: Reported on 02/27/2018) 40 tablet 0  . nystatin-triamcinolone ointment (MYCOLOG) Apply 1 application topically 2 (two) times daily. (Patient not taking: Reported on 02/27/2018) 30 g 0  .  Soap & Cleansers (CETAPHIL GENTLE CLEANSER) LIQD Apply 1 application topically 2 (two) times daily. For face washing. Rinse off with warm water and pat dry. (Patient not taking: Reported on 02/27/2018)    . sulfamethoxazole-trimethoprim (BACTRIM DS,SEPTRA DS) 800-160 MG tablet Take 1 tablet by mouth 2 (two) times daily. (Patient not taking: Reported on 02/27/2018) 14 tablet 0   No facility-administered medications prior to visit.     ROS Review of Systems  Constitutional: Negative for activity change, appetite change and fatigue.  HENT: Negative for congestion, sinus pressure and sore throat.   Eyes:  Negative for visual disturbance.  Respiratory: Negative for cough, chest tightness, shortness of breath and wheezing.   Cardiovascular: Negative for chest pain and palpitations.  Gastrointestinal: Negative for abdominal distention, abdominal pain and constipation.  Endocrine: Negative for polydipsia.  Genitourinary: Negative for dysuria and frequency.  Musculoskeletal:       See hpi  Skin: Positive for rash.  Neurological: Negative for tremors, light-headedness and numbness.  Hematological: Does not bruise/bleed easily.  Psychiatric/Behavioral: Negative for agitation and behavioral problems.    Objective:  BP (!) 148/97   Pulse 89   Temp 97.6 F (36.4 C) (Oral)   Ht 5' 3"  (1.6 m)   Wt 158 lb (71.7 kg)   SpO2 99%   BMI 27.99 kg/m   BP/Weight 02/27/2018 12/16/2017 45/0/3888  Systolic BP 280 034 917  Diastolic BP 97 78 79  Wt. (Lbs) 158 157 157.6  BMI 27.99 27.81 27.92      Physical Exam  Constitutional: She is oriented to person, place, and time. She appears well-developed and well-nourished.  Cardiovascular: Normal rate, normal heart sounds and intact distal pulses.  No murmur heard. Pulmonary/Chest: Effort normal and breath sounds normal. She has no wheezes. She has no rales. She exhibits no tenderness.  Abdominal: Soft. Bowel sounds are normal. She exhibits no distension and no mass. There is no tenderness.  Musculoskeletal: Normal range of motion.  Neurological: She is alert and oriented to person, place, and time.  Skin: Rash (slight peeling of skin of the tips of both fingers) noted.  Psychiatric: She has a normal mood and affect.     CMP Latest Ref Rng & Units 03/05/2017 11/02/2016 01/16/2016  Glucose 65 - 99 mg/dL - 190(H) 165(H)  BUN 7 - 25 mg/dL - 13 10  Creatinine 0.50 - 1.10 mg/dL - 0.50 0.56  Sodium 135 - 146 mmol/L - 136 136  Potassium 3.5 - 5.3 mmol/L - 4.1 4.1  Chloride 98 - 110 mmol/L - 101 98  CO2 20 - 31 mmol/L - 26 21  Calcium 8.6 - 10.2 mg/dL - 9.0  9.5  Total Protein 6.0 - 8.5 g/dL 6.6 - 7.2  Total Bilirubin 0.0 - 1.2 mg/dL 0.2 - 0.4  Alkaline Phos 39 - 117 IU/L 69 - 60  AST 0 - 40 IU/L 21 - 21  ALT 0 - 32 IU/L 18 - 19    Lipid Panel     Component Value Date/Time   CHOL 152 03/05/2017 1040   TRIG 332 (H) 03/05/2017 1040   HDL 39 (L) 03/05/2017 1040   CHOLHDL 3.9 03/05/2017 1040   CHOLHDL 5.5 (H) 11/02/2016 1102   VLDL 80 (H) 11/02/2016 1102   LDLCALC 47 03/05/2017 1040    Lab Results  Component Value Date   HGBA1C 8.4 02/27/2018    Assessment & Plan:   1. Type 2 diabetes mellitus without complication, without long-term current use of insulin ([facility])  Uncontrolled with A1c of 8.4 which is up from 6.9 Discontinue Glipizide due to hypoglycemic symptoms and commence Farxiga Emphasized the need to be compliant with a diabetic diet and lifestyle modifications - POCT glucose (manual entry) - POCT glycosylated hemoglobin (Hb A1C) - CMP14+EGFR - Lipid panel - Microalbumin/Creatinine Ratio, Urine - dapagliflozin propanediol (FARXIGA) 10 MG TABS tablet; Take 10 mg by mouth daily.  Dispense: 30 tablet; Refill: 6 - metFORMIN (GLUCOPHAGE) 500 MG tablet; Take 2 tablets (1,000 mg total) by mouth 2 (two) times daily with a meal.  Dispense: 120 tablet; Refill: 6  2. Essential hypertension Elevated BP due to being out of medications which I have refilled - labetalol (NORMODYNE) 100 MG tablet; Take 1 tablet (100 mg total) by mouth 2 (two) times daily.  Dispense: 60 tablet; Refill: 6  3. Mixed hyperlipidemia Stable Low cholesterol diet - atorvastatin (LIPITOR) 40 MG tablet; Take 1 tablet (40 mg total) by mouth daily.  Dispense: 30 tablet; Refill: 6  4. Dermatitis Secondary to irritation from herbal cosmetic application - hydrocortisone cream 0.5 %; Apply 1 application topically 2 (two) times daily.  Dispense: 30 g; Refill: 0  5. Chronic pain of right knee Likely underlying osteoarthritis   Meds ordered this encounter    Medications  . dapagliflozin propanediol (FARXIGA) 10 MG TABS tablet    Sig: Take 10 mg by mouth daily.    Dispense:  30 tablet    Refill:  6    Discontinue glipizide  . hydrocortisone cream 0.5 %    Sig: Apply 1 application topically 2 (two) times daily.    Dispense:  30 g    Refill:  0  . labetalol (NORMODYNE) 100 MG tablet    Sig: Take 1 tablet (100 mg total) by mouth 2 (two) times daily.    Dispense:  60 tablet    Refill:  6  . metFORMIN (GLUCOPHAGE) 500 MG tablet    Sig: Take 2 tablets (1,000 mg total) by mouth 2 (two) times daily with a meal.    Dispense:  120 tablet    Refill:  6  . atorvastatin (LIPITOR) 40 MG tablet    Sig: Take 1 tablet (40 mg total) by mouth daily.    Dispense:  30 tablet    Refill:  6  . ibuprofen (ADVIL,MOTRIN) 800 MG tablet    Sig: Take 1 tablet (800 mg total) by mouth 2 (two) times daily as needed.    Dispense:  60 tablet    Refill:  1    Follow-up: Return in about 3 months (around 05/30/2018) for Follow-up of chronic medical conditions.   [name] MD

## 2018-02-28 ENCOUNTER — Encounter: Payer: Self-pay | Admitting: Family Medicine

## 2018-02-28 LAB — CMP14+EGFR
A/G RATIO: 1.9 (ref 1.2–2.2)
ALT: 32 IU/L (ref 0–32)
AST: 32 IU/L (ref 0–40)
Albumin: 4.5 g/dL (ref 3.5–5.5)
Alkaline Phosphatase: 62 IU/L (ref 39–117)
BUN/Creatinine Ratio: 22 (ref 9–23)
BUN: 11 mg/dL (ref 6–24)
Bilirubin Total: <0.2 mg/dL (ref 0.0–1.2)
CALCIUM: 9.5 mg/dL (ref 8.7–10.2)
CO2: 21 mmol/L (ref 20–29)
Chloride: 96 mmol/L (ref 96–106)
Creatinine, Ser: 0.5 mg/dL — ABNORMAL LOW (ref 0.57–1.00)
GFR calc Af Amer: 131 mL/min/{1.73_m2} (ref >59)
GFR, EST NON AFRICAN AMERICAN: 114 mL/min/{1.73_m2} (ref >59)
GLOBULIN, TOTAL: 2.4 g/dL (ref 1.5–4.5)
Glucose: 172 mg/dL — ABNORMAL HIGH (ref 65–99)
POTASSIUM: 4.1 mmol/L (ref 3.5–5.2)
SODIUM: 135 mmol/L (ref 134–144)
Total Protein: 6.9 g/dL (ref 6.0–8.5)

## 2018-02-28 LAB — LIPID PANEL
CHOL/HDL RATIO: 5 ratio — AB (ref 0.0–4.4)
Cholesterol, Total: 215 mg/dL — ABNORMAL HIGH (ref 100–199)
HDL: 43 mg/dL (ref >39)
TRIGLYCERIDES: 507 mg/dL — AB (ref 0–149)

## 2018-02-28 LAB — MICROALBUMIN / CREATININE URINE RATIO: CREATININE, UR: 83.8 mg/dL

## 2018-02-28 MED ORDER — ATORVASTATIN CALCIUM 80 MG PO TABS: 80.0000 mg | ORAL_TABLET | Freq: Every day | ORAL | 3 refills | Status: DC

## 2018-03-06 ENCOUNTER — Telehealth: Payer: Self-pay

## 2018-03-06 NOTE — Telephone Encounter (Signed)
[facility] Konrad Penta [phone] contacted pt to go over lab results pt didn't answer left a detailed vm for pt and if she has any questions or concerns to give me a call

## 2018-03-07 ENCOUNTER — Other Ambulatory Visit: Payer: Self-pay

## 2018-03-07 ENCOUNTER — Telehealth: Payer: Self-pay | Admitting: Family Medicine

## 2018-03-07 MED ORDER — GLIPIZIDE 5 MG PO TABS: 5.0000 mg | ORAL_TABLET | Freq: Every day | ORAL | 3 refills | Status: DC

## 2018-03-07 MED FILL — TERCONAZOLE 0.4% VAG CREAM: 0.4 | 7 days supply | Qty: 45 | Fill #0

## 2018-03-07 MED FILL — glipiZIDE 5 MG TABS: 5 | 90 days supply | Qty: 90 | Fill #0

## 2018-03-07 NOTE — Telephone Encounter (Signed)
Patient called and stated that the Farxiga medication she has been taking is making her sick, patient stated she throws up and has diahhrea. Patient also requested for a refill on Glipizide 5 mg tablets. Please send to [facility] pharmacy.

## 2018-03-10 MED FILL — ATORVASTATIN 80 MG TABLET: 80 | 90 days supply | Qty: 90 | Fill #0

## 2018-03-10 MED FILL — TRUE METRIX TEST STRIP: 75 days supply | Qty: 300 | Fill #3

## 2018-03-11 MED FILL — CLOMIPHENE CITRATE 50 MG TA: 50 | 90 days supply | Qty: 45 | Fill #0

## 2018-03-11 MED FILL — metFORMIN HCL 500 MG TABS: 500 | 90 days supply | Qty: 360 | Fill #0

## 2018-06-12 ENCOUNTER — Encounter: Payer: Self-pay | Admitting: Family Medicine

## 2018-06-12 ENCOUNTER — Ambulatory Visit: Payer: Self-pay | Attending: Family Medicine | Admitting: Family Medicine

## 2018-06-12 VITALS — BP 130/83 | HR 90 | Temp 97.7°F | Ht 63.0 in | Wt 153.6 lb

## 2018-06-12 DIAGNOSIS — E782 Mixed hyperlipidemia: Secondary | ICD-10-CM

## 2018-06-12 DIAGNOSIS — Z7984 Long term (current) use of oral hypoglycemic drugs: Secondary | ICD-10-CM | POA: Insufficient documentation

## 2018-06-12 DIAGNOSIS — Z0000 Encounter for general adult medical examination without abnormal findings: Secondary | ICD-10-CM

## 2018-06-12 DIAGNOSIS — E119 Type 2 diabetes mellitus without complications: Secondary | ICD-10-CM

## 2018-06-12 DIAGNOSIS — L708 Other acne: Secondary | ICD-10-CM

## 2018-06-12 LAB — POCT GLYCOSYLATED HEMOGLOBIN (HGB A1C): HbA1c, POC (controlled diabetic range): 6.8 % (ref 0.0–7.0)

## 2018-06-12 LAB — GLUCOSE, POCT (MANUAL RESULT ENTRY): POC Glucose: 166 mg/dl — AB (ref 70–99)

## 2018-06-12 MED ORDER — CLINDAMYCIN PHOS-BENZOYL PEROX 1-5 % EX GEL: Freq: Two times a day (BID) | CUTANEOUS | 1 refills | Status: DC

## 2018-06-12 MED ORDER — METFORMIN HCL 500 MG PO TABS
1000.0000 mg | ORAL_TABLET | Freq: Two times a day (BID) | ORAL | 6 refills | Status: DC
Start: 2018-06-12 — End: 2018-07-10

## 2018-06-12 MED ORDER — GLIPIZIDE 5 MG PO TABS: 2.5000 mg | ORAL_TABLET | Freq: Every day | ORAL | 6 refills | Status: DC

## 2018-06-12 MED ORDER — ATORVASTATIN CALCIUM 80 MG PO TABS: 80.0000 mg | ORAL_TABLET | Freq: Every day | ORAL | 6 refills | Status: DC

## 2018-06-12 MED FILL — ?GLIPIZIDE 5MG TABLET: 5 | 30 days supply | Qty: 15 | Fill #0

## 2018-06-12 MED FILL — ATORVASTATIN 80 MG TABLET: 80 | 30 days supply | Qty: 30 | Fill #0

## 2018-06-12 MED FILL — CLINDAMYCIN PHOS-BENZOYL PE: 1-5 | 30 days supply | Qty: 25 | Fill #0

## 2018-06-12 MED FILL — ?METFORMIN HCL 500MG TABS: 500 | 30 days supply | Qty: 120 | Fill #0

## 2018-06-12 NOTE — Patient Instructions (Signed)
Diabetes Mellitus and Nutrition When you have diabetes (diabetes mellitus), it is very important to have healthy eating habits because your blood sugar (glucose) levels are greatly affected by what you eat and drink. Eating healthy foods in the appropriate amounts, at about the same times every day, can help you:  Control your blood glucose.  Lower your risk of heart disease.  Improve your blood pressure.  Reach or maintain a healthy weight.  Every person with diabetes is different, and each person has different needs for a meal plan. Your health care provider may recommend that you work with a diet and nutrition specialist (dietitian) to make a meal plan that is best for you. Your meal plan may vary depending on factors such as:  The calories you need.  The medicines you take.  Your weight.  Your blood glucose, blood pressure, and cholesterol levels.  Your activity level.  Other health conditions you have, such as heart or kidney disease.  How do carbohydrates affect me? Carbohydrates affect your blood glucose level more than any other type of food. Eating carbohydrates naturally increases the amount of glucose in your blood. Carbohydrate counting is a method for keeping track of how many carbohydrates you eat. Counting carbohydrates is important to keep your blood glucose at a healthy level, especially if you use insulin or take certain oral diabetes medicines. It is important to know how many carbohydrates you can safely have in each meal. This is different for every person. Your dietitian can help you calculate how many carbohydrates you should have at each meal and for snack. Foods that contain carbohydrates include:  Bread, cereal, rice, pasta, and crackers.  Potatoes and corn.  Peas, beans, and lentils.  Milk and yogurt.  Fruit and juice.  Desserts, such as cakes, cookies, ice cream, and candy.  How does alcohol affect me? Alcohol can cause a sudden decrease in blood  glucose (hypoglycemia), especially if you use insulin or take certain oral diabetes medicines. Hypoglycemia can be a life-threatening condition. Symptoms of hypoglycemia (sleepiness, dizziness, and confusion) are similar to symptoms of having too much alcohol. If your health care provider says that alcohol is safe for you, follow these guidelines:  Limit alcohol intake to no more than 1 drink per day for nonpregnant women and 2 drinks per day for men. One drink equals 12 oz of beer, 5 oz of wine, or 1 oz of hard liquor.  Do not drink on an empty stomach.  Keep yourself hydrated with water, diet soda, or unsweetened iced tea.  Keep in mind that regular soda, juice, and other mixers may contain a lot of sugar and must be counted as carbohydrates.  What are tips for following this plan? Reading food labels  Start by checking the serving size on the label. The amount of calories, carbohydrates, fats, and other nutrients listed on the label are based on one serving of the food. Many foods contain more than one serving per package.  Check the total grams (g) of carbohydrates in one serving. You can calculate the number of servings of carbohydrates in one serving by dividing the total carbohydrates by 15. For example, if a food has 30 g of total carbohydrates, it would be equal to 2 servings of carbohydrates.  Check the number of grams (g) of saturated and trans fats in one serving. Choose foods that have low or no amount of these fats.  Check the number of milligrams (mg) of sodium in one serving. Most people   should limit total sodium intake to less than 2,300 mg per day.  Always check the nutrition information of foods labeled as "low-fat" or "nonfat". These foods may be higher in added sugar or refined carbohydrates and should be avoided.  Talk to your dietitian to identify your daily goals for nutrients listed on the label. Shopping  Avoid buying canned, premade, or processed foods. These  foods tend to be high in fat, sodium, and added sugar.  Shop around the outside edge of the grocery store. This includes fresh fruits and vegetables, bulk grains, fresh meats, and fresh dairy. Cooking  Use low-heat cooking methods, such as baking, instead of high-heat cooking methods like deep frying.  Cook using healthy oils, such as olive, canola, or sunflower oil.  Avoid cooking with butter, cream, or high-fat meats. Meal planning  Eat meals and snacks regularly, preferably at the same times every day. Avoid going long periods of time without eating.  Eat foods high in fiber, such as fresh fruits, vegetables, beans, and whole grains. Talk to your dietitian about how many servings of carbohydrates you can eat at each meal.  Eat 4-6 ounces of lean protein each day, such as lean meat, chicken, fish, eggs, or tofu. 1 ounce is equal to 1 ounce of meat, chicken, or fish, 1 egg, or 1/4 cup of tofu.  Eat some foods each day that contain healthy fats, such as avocado, nuts, seeds, and fish. Lifestyle   Check your blood glucose regularly.  Exercise at least 30 minutes 5 or more days each week, or as told by your health care provider.  Take medicines as told by your health care provider.  Do not use any products that contain nicotine or tobacco, such as cigarettes and e-cigarettes. If you need help quitting, ask your health care provider.  Work with a counselor or diabetes educator to identify strategies to manage stress and any emotional and social challenges. What are some questions to ask my health care provider?  Do I need to meet with a diabetes educator?  Do I need to meet with a dietitian?  What number can I call if I have questions?  When are the best times to check my blood glucose? Where to find more information:  American Diabetes Association: diabetes.org/food-and-fitness/food  Academy of Nutrition and Dietetics:  www.eatright.org/resources/health/diseases-and-conditions/diabetes  National Institute of Diabetes and Digestive and Kidney Diseases (NIH): www.niddk.nih.gov/health-information/diabetes/overview/diet-eating-physical-activity Summary  A healthy meal plan will help you control your blood glucose and maintain a healthy lifestyle.  Working with a diet and nutrition specialist (dietitian) can help you make a meal plan that is best for you.  Keep in mind that carbohydrates and alcohol have immediate effects on your blood glucose levels. It is important to count carbohydrates and to use alcohol carefully. This information is not intended to replace advice given to you by your health care provider. Make sure you discuss any questions you have with your health care provider. Document Released: 07/05/2005 Document Revised: 11/12/2016 Document Reviewed: 11/12/2016 Elsevier Interactive Patient Education  2018 [employer]  

## 2018-06-12 NOTE — Progress Notes (Signed)
Subjective:  Patient ID: Rebecca [name], female    DOB: [birth year]  Age: 52 y.o. MRN: [id number]  CC: Diabetes   HPI Illianna A [name] is a 52 year old female with a history of type 2 diabetes mellitus (A1c 6.8), hyperlipidemia who presents today for a follow-up visit. Her A1c is 6.8 which has improved from 8.4 previously; Wilder Glade was commenced at her last office visit however she discontinued this due to diarrhea and has been taking metformin regularly but takes glipizide on an as-needed basis as she has had some hypoglycemic episodes.  She has modified her diet to include lots of fish, brown rice and whole wheat bread and tries to exercise.  Denies numbness in extremities or visual concerns. With regards to her hypertension she discontinued labetalol as it made her fatigued.  Doing well on her statin with no complaints of myalgias. She is concerned about acne lesions which have developed on her face and have not responded to the use of topical OTC facial washes. She informs me she has been doing a lot of thinking lately as she is divorced her husband last month due to the fact that he was about to marry his second wife but then goes on to say she is coping well as she recently got married to another man from her country and is in the process of getting him over to the [place].  She declined speaking with an LCSW as she feels she is coping well.  Past Medical History:  Diagnosis Date  . Arthritis   . Diabetes mellitus   . Hyperlipidemia    Past Surgical History:  Procedure Laterality Date  . CESAREAN SECTION      No Known Allergies   Outpatient Medications Prior to Visit  Medication Sig Dispense Refill  . acetaminophen (TYLENOL) 500 MG tablet Take 1,000 mg by mouth daily as needed for mild pain. Reported on 01/16/2016    . Dapsone 5 % topical gel APPLY ONE PEA SIZED AMOUNT TO FACE TWICE A DAY. 30 g 1  . glucose blood test strip Use as instructed 100 each 11  . hydrocortisone  cream 0.5 % Apply 1 application topically 2 (two) times daily. 30 g 0  . ibuprofen (ADVIL,MOTRIN) 800 MG tablet Take 1 tablet (800 mg total) by mouth 2 (two) times daily as needed. 60 tablet 1  . atorvastatin (LIPITOR) 80 MG tablet Take 1 tablet (80 mg total) by mouth daily. 30 tablet 3  . glipiZIDE (GLUCOTROL) 5 MG tablet Take 1 tablet (5 mg total) by mouth daily before breakfast. 60 tablet 3  . metFORMIN (GLUCOPHAGE) 500 MG tablet Take 2 tablets (1,000 mg total) by mouth 2 (two) times daily with a meal. 120 tablet 6  . lidocaine (XYLOCAINE) 5 % ointment Apply 1 application topically daily as needed. Apply to affected areas. (Patient not taking: Reported on 02/27/2018) 30 g 0  . naproxen (NAPROSYN) 500 MG tablet Take 1 tablet (500 mg total) by mouth 2 (two) times daily with a meal. (Patient not taking: Reported on 02/27/2018) 40 tablet 0  . cetirizine (ZYRTEC) 10 MG tablet Take 1 tablet (10 mg total) by mouth daily. (Patient not taking: Reported on 06/12/2018) 30 tablet 11  . dapagliflozin propanediol (FARXIGA) 10 MG TABS tablet Take 10 mg by mouth daily. (Patient not taking: Reported on 06/12/2018) 30 tablet 6  . labetalol (NORMODYNE) 100 MG tablet Take 1 tablet (100 mg total) by mouth 2 (two) times daily. (Patient not taking: Reported on 06/12/2018)  60 tablet 6   No facility-administered medications prior to visit.     ROS Review of Systems  Constitutional: Negative for activity change, appetite change and fatigue.  HENT: Negative for congestion, sinus pressure and sore throat.   Eyes: Negative for visual disturbance.  Respiratory: Negative for cough, chest tightness, shortness of breath and wheezing.   Cardiovascular: Negative for chest pain and palpitations.  Gastrointestinal: Negative for abdominal distention, abdominal pain and constipation.  Endocrine: Negative for polydipsia.  Genitourinary: Negative for dysuria and frequency.  Musculoskeletal: Negative for arthralgias and back pain.    Skin: Positive for rash.  Neurological: Negative for tremors, light-headedness and numbness.  Hematological: Does not bruise/bleed easily.  Psychiatric/Behavioral: Negative for agitation and behavioral problems.    Objective:  BP 130/83   Pulse 90   Temp 97.7 F (36.5 C) (Oral)   Ht 5\' 3"  (1.6 m)   Wt 153 lb 9.6 oz (69.7 kg)   SpO2 96%   BMI 27.21 kg/m   BP/Weight 06/12/2018 02/27/2018 12/20/6008  Systolic BP 932 355 732  Diastolic BP 83 97 78  Wt. (Lbs) 153.6 158 157  BMI 27.21 27.99 27.81      Physical Exam  Constitutional: She is oriented to person, place, and time. She appears well-developed and well-nourished.  Cardiovascular: Normal rate, normal heart sounds and intact distal pulses.  No murmur heard. Pulmonary/Chest: Effort normal and breath sounds normal. She has no wheezes. She has no rales. She exhibits no tenderness.  Abdominal: Soft. Bowel sounds are normal. She exhibits no distension and no mass. There is no tenderness.  Musculoskeletal: Normal range of motion.  Neurological: She is alert and oriented to person, place, and time.  Skin:  Closed comedones on lower half of face  Psychiatric: She has a normal mood and affect.    CMP Latest Ref Rng & Units 02/27/2018 03/05/2017 11/02/2016  Glucose 65 - 99 mg/dL 172(H) - 190(H)  BUN 6 - 24 mg/dL 11 - 13  Creatinine 0.57 - 1.00 mg/dL 0.50(L) - 0.50  Sodium 134 - 144 mmol/L 135 - 136  Potassium 3.5 - 5.2 mmol/L 4.1 - 4.1  Chloride 96 - 106 mmol/L 96 - 101  CO2 20 - 29 mmol/L 21 - 26  Calcium 8.7 - 10.2 mg/dL 9.5 - 9.0  Total Protein 6.0 - 8.5 g/dL 6.9 6.6 -  Total Bilirubin 0.0 - 1.2 mg/dL <0.2 0.2 -  Alkaline Phos 39 - 117 IU/L 62 69 -  AST 0 - 40 IU/L 32 21 -  ALT 0 - 32 IU/L 32 18 -    Lipid Panel     Component Value Date/Time   CHOL 215 (H) 02/27/2018 1019   TRIG 507 (H) 02/27/2018 1019   HDL 43 02/27/2018 1019   CHOLHDL 5.0 (H) 02/27/2018 1019   CHOLHDL 5.5 (H) 11/02/2016 1102   VLDL 80 (H)  11/02/2016 1102   LDLCALC Comment 02/27/2018 1019    Lab Results  Component Value Date   HGBA1C 6.8 06/12/2018     Assessment & Plan:   1. Type 2 diabetes mellitus without complication, without long-term current use of insulin (HCC) Controlled with A1c of 6.8 which has trended down from 8.4 She discontinued Farxiga due to diarrhea Reduce glipizide from 5 mg to 2.5 mg daily due to some hypoglycemic episodes Counseled on Diabetic diet, my plate method, 202 minutes of moderate intensity exercise/week Keep blood sugar logs with fasting goals of 80-120 mg/dl, random of less than 180 and in  the event of sugars less than 60 mg/dl or greater than 400 mg/dl please notify the clinic ASAP. It is recommended that you undergo annual eye exams and annual foot exams. Pneumonia vaccine is recommended. - POCT glucose (manual entry) - POCT glycosylated hemoglobin (Hb A1C) - metFORMIN (GLUCOPHAGE) 500 MG tablet; Take 2 tablets (1,000 mg total) by mouth 2 (two) times daily with a meal.  Dispense: 120 tablet; Refill: 6 - glipiZIDE (GLUCOTROL) 5 MG tablet; Take 0.5 tablets (2.5 mg total) by mouth daily before breakfast.  Dispense: 15 tablet; Refill: 6  2. Mixed hyperlipidemia Uncontrolled from last set of labs with associated hypertriglyceridemia Regimen had been adjusted at her last visit Make no regimen changes today Low-cholesterol diet - atorvastatin (LIPITOR) 80 MG tablet; Take 1 tablet (80 mg total) by mouth daily.  Dispense: 30 tablet; Refill: 6  3. Health care maintenance Up-to-date on Pap smear by her [facility] 3 months ago.  4. Other acne - clindamycin-benzoyl peroxide (BENZACLIN) gel; Apply topically 2 (two) times daily.  Dispense: 25 g; Refill: 1   Meds ordered this encounter  Medications  . metFORMIN (GLUCOPHAGE) 500 MG tablet    Sig: Take 2 tablets (1,000 mg total) by mouth 2 (two) times daily with a meal.    Dispense:  120 tablet    Refill:  6  . glipiZIDE (GLUCOTROL) 5 MG  tablet    Sig: Take 0.5 tablets (2.5 mg total) by mouth daily before breakfast.    Dispense:  15 tablet    Refill:  6    Discontinue previous dose  . atorvastatin (LIPITOR) 80 MG tablet    Sig: Take 1 tablet (80 mg total) by mouth daily.    Dispense:  30 tablet    Refill:  6  . clindamycin-benzoyl peroxide (BENZACLIN) gel    Sig: Apply topically 2 (two) times daily.    Dispense:  25 g    Refill:  1    Follow-up: Return in about 3 months (around 09/12/2018) for follow up of diabetes mellitus.   [name] MD

## 2018-07-10 ENCOUNTER — Ambulatory Visit (HOSPITAL_COMMUNITY)
Admission: RE | Admit: 2018-07-10 | Discharge: 2018-07-10 | Disposition: A | Discharge status: Home / Self Care | Payer: Self-pay | Source: Ambulatory Visit | Attending: Physician Assistant | Admitting: Physician Assistant

## 2018-07-10 ENCOUNTER — Ambulatory Visit: Payer: Self-pay | Attending: Family Medicine | Admitting: Physician Assistant

## 2018-07-10 VITALS — BP 147/89 | HR 85 | Temp 98.5°F | Resp 18 | Ht 63.0 in | Wt 155.8 lb

## 2018-07-10 DIAGNOSIS — M545 Low back pain, unspecified: Secondary | ICD-10-CM

## 2018-07-10 DIAGNOSIS — M1990 Unspecified osteoarthritis, unspecified site: Secondary | ICD-10-CM | POA: Insufficient documentation

## 2018-07-10 DIAGNOSIS — M4316 Spondylolisthesis, lumbar region: Secondary | ICD-10-CM | POA: Insufficient documentation

## 2018-07-10 DIAGNOSIS — E119 Type 2 diabetes mellitus without complications: Secondary | ICD-10-CM | POA: Insufficient documentation

## 2018-07-10 DIAGNOSIS — Z7984 Long term (current) use of oral hypoglycemic drugs: Secondary | ICD-10-CM | POA: Insufficient documentation

## 2018-07-10 DIAGNOSIS — Z789 Other specified health status: Secondary | ICD-10-CM

## 2018-07-10 DIAGNOSIS — E782 Mixed hyperlipidemia: Secondary | ICD-10-CM | POA: Insufficient documentation

## 2018-07-10 DIAGNOSIS — Z79899 Other long term (current) drug therapy: Secondary | ICD-10-CM | POA: Insufficient documentation

## 2018-07-10 LAB — GLUCOSE, POCT (MANUAL RESULT ENTRY): POC Glucose: 207 mg/dl — AB (ref 70–99)

## 2018-07-10 IMAGING — CR DG LUMBAR SPINE COMPLETE 4+V
5 series · 5 of 5 positions shown · non-contrast
Comparison: CT [DATE]

CLINICAL DATA: Acute right-sided low back pain without sciatica

EXAM:
LUMBAR SPINE - COMPLETE 4+ VIEW

[l-spine obl (1 of 2)]
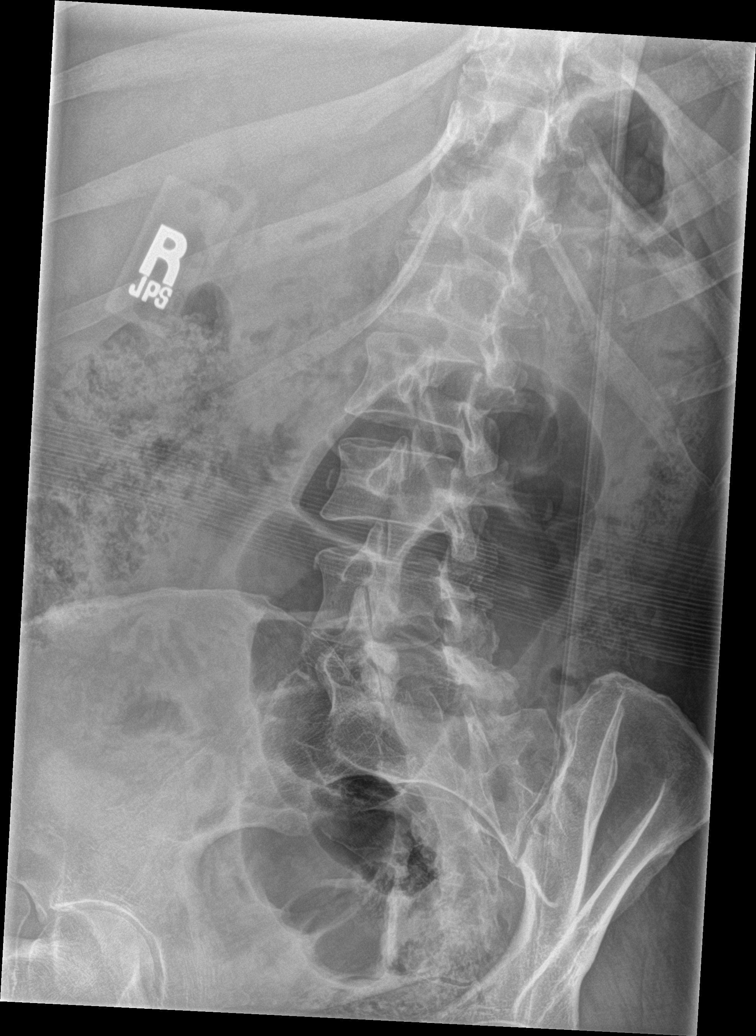

[l-spine obl (2 of 2)]
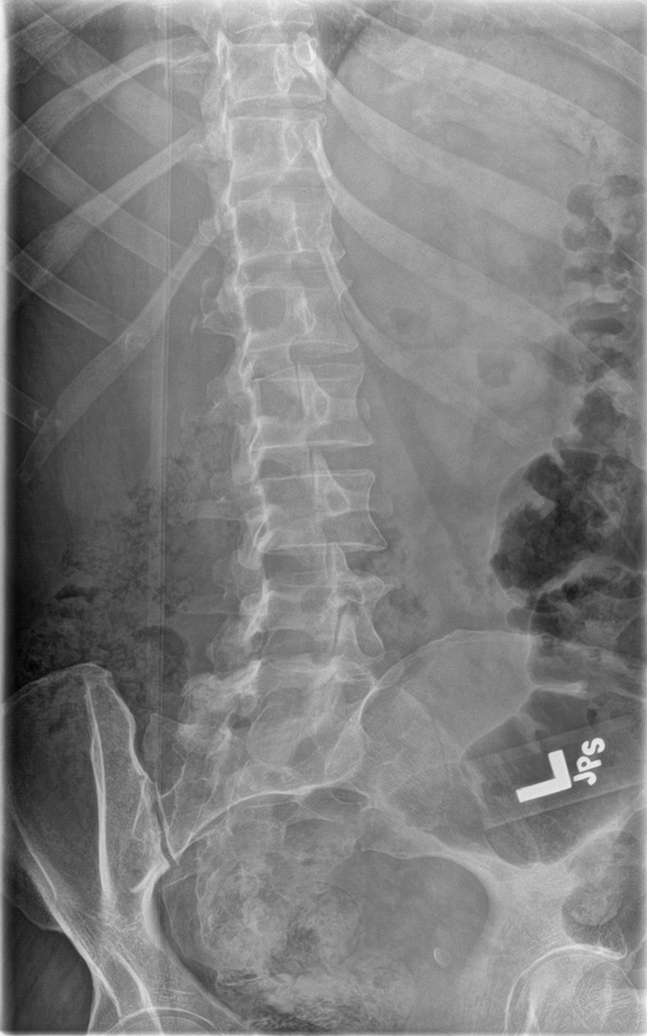

[l-spine lat]
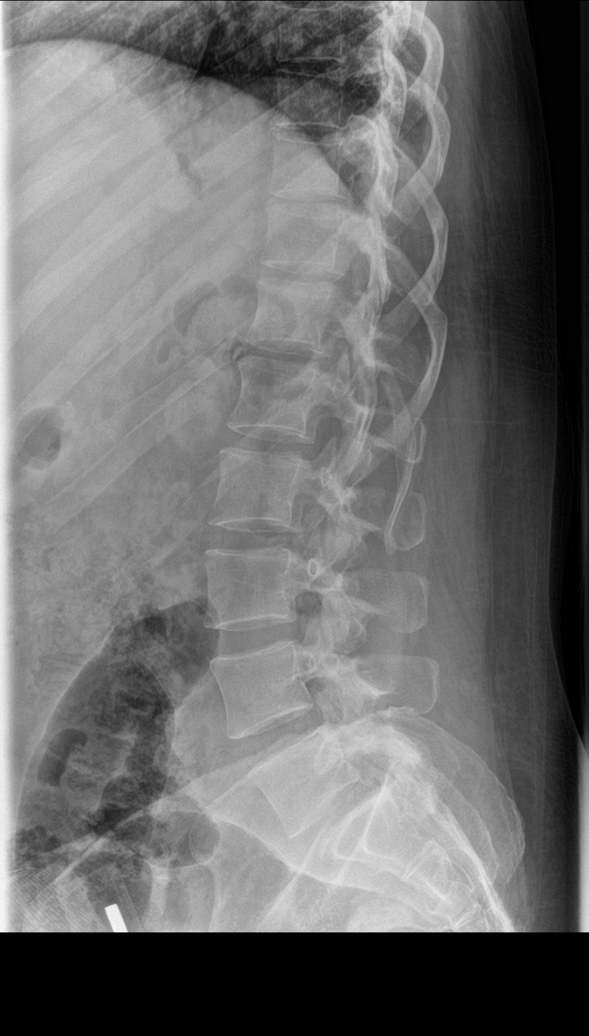

[l-spine spot]
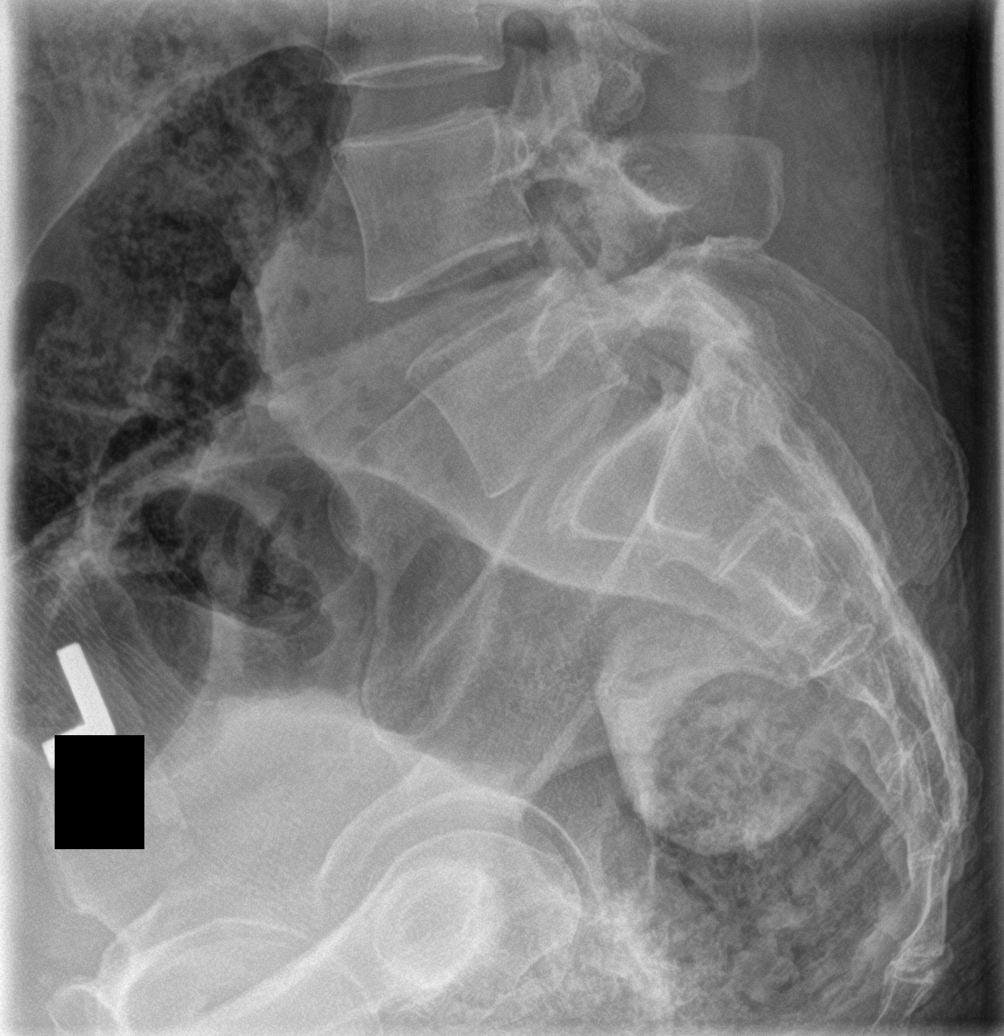

[l-spine ap]
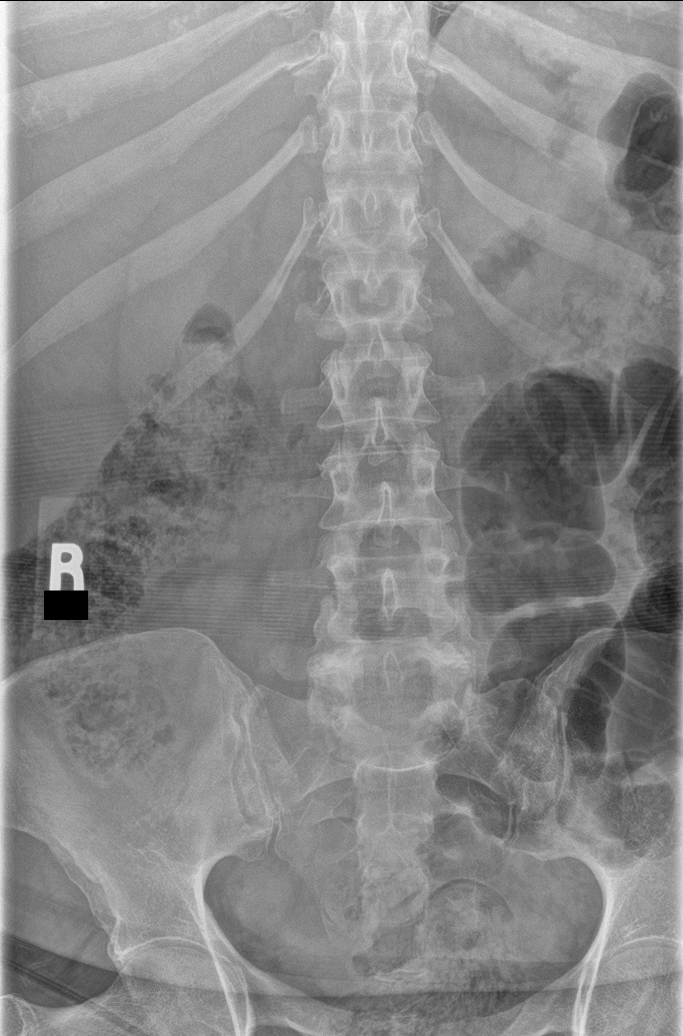

[5 of 5 positions shown; findings below may reference images not displayed]

FINDINGS: Five lumbar type vertebral bodies. L4-5 and L5-S1 degenerative facet
spurring with mild anterolisthesis. Disc height is preserved. No
evidence of fracture or bone lesion.
IMPRESSION: Facet arthropathy with anterolisthesis at L4-5 and L5-S1.

## 2018-07-10 MED ORDER — ATORVASTATIN CALCIUM 80 MG PO TABS: 80.0000 mg | ORAL_TABLET | Freq: Every day | ORAL | 1 refills | Status: DC

## 2018-07-10 MED ORDER — METFORMIN HCL 500 MG PO TABS: 1000.0000 mg | ORAL_TABLET | Freq: Two times a day (BID) | ORAL | 1 refills | Status: DC

## 2018-07-10 MED ORDER — IBUPROFEN 800 MG PO TABS: 800.0000 mg | ORAL_TABLET | Freq: Two times a day (BID) | ORAL | 1 refills | Status: DC | PRN

## 2018-07-10 MED ORDER — METHOCARBAMOL 500 MG PO TABS: 500.0000 mg | ORAL_TABLET | Freq: Three times a day (TID) | ORAL | 0 refills | Status: DC | PRN

## 2018-07-10 MED ORDER — GLIPIZIDE 5 MG PO TABS: 5.0000 mg | ORAL_TABLET | Freq: Every day | ORAL | 1 refills | Status: DC

## 2018-07-10 MED FILL — TRUE METRIX TEST STRIP: 75 days supply | Qty: 150 | Fill #1

## 2018-07-10 MED FILL — IBUPROFEN 800 MG TABLET: 800 | 30 days supply | Qty: 60 | Fill #0

## 2018-07-10 MED FILL — glipiZIDE 5 MG TABS: 5 | 90 days supply | Qty: 90 | Fill #0

## 2018-07-10 MED FILL — METHOCARBAMOL 500 MG TABS: 500 | 30 days supply | Qty: 90 | Fill #0

## 2018-07-10 MED FILL — metFORMIN HCL 500 MG TABS: 500 | 90 days supply | Qty: 360 | Fill #0

## 2018-07-10 MED FILL — ATORVASTATIN 80 MG TABLET: 80 | 90 days supply | Qty: 90 | Fill #0

## 2018-07-10 NOTE — Progress Notes (Signed)
Patient ID: Rebecca [name], female   DOB: [birth year], 52 y.o.   MRN: [id number]   Rebecca [name], is a 52 y.o. female  [id number]:[id number]  IRS:[id number]  DOB - 14-May-1966  Subjective:  Chief Complaint and HPI: Rebecca [name] is a 52 y.o. female here today bc diabetes isn't controlled.  Blood sugars running in 200s.  Needs to increase glipizide.  Also needs 2 [place] supply [place].    Also c/o R lower back pain X 3 days.  NKI.  Feels like the "bones are hurt."  No urinary s/sx.  No leg pain.  No paresthesias or weakness. Not SA.    ROS:   Constitutional:  No f/c, No night sweats, No unexplained weight loss. EENT:  No vision changes, No blurry vision, No hearing changes. No mouth, throat, or ear problems.  Respiratory: No cough, No SOB Cardiac: No CP, no palpitations GI:  No abd pain, No N/V/D. GU: No Urinary s/sx Musculoskeletal: R low back pain Neuro: No headache, no dizziness, no motor weakness.  Skin: No rash Endocrine:  No polydipsia. No polyuria.  Psych: Denies SI/HI  No problems updated.  ALLERGIES: No Known Allergies  PAST MEDICAL HISTORY: Past Medical History:  Diagnosis Date  . Arthritis   . Diabetes mellitus   . Hyperlipidemia     MEDICATIONS AT HOME: Prior to Admission medications   Medication Sig Start Date End Date Taking? Authorizing Provider  acetaminophen (TYLENOL) 500 MG tablet Take 1,000 mg by mouth daily as needed for mild pain. Reported on 01/16/2016   Yes [provider]  atorvastatin (LIPITOR) 80 MG tablet Take 1 tablet (80 mg total) by mouth daily. 07/10/18  Yes Soniyah Mcglory, Dionne Bucy, PA-C  clindamycin-benzoyl peroxide (BENZACLIN) gel Apply topically 2 (two) times daily. 06/12/18  Yes Newlin, [name], MD  Dapsone 5 % topical gel APPLY ONE PEA SIZED AMOUNT TO FACE TWICE A DAY. 06/26/17  Yes Hairston, Mandesia R, FNP  glipiZIDE (GLUCOTROL) 5 MG tablet Take 1 tablet (5 mg total) by mouth daily before breakfast. 07/10/18  Yes  Midas Daughety M, PA-C  glucose blood test strip Use as instructed 09/25/17  Yes Hairston, Mandesia R, FNP  hydrocortisone cream 0.5 % Apply 1 application topically 2 (two) times daily. 02/27/18  Yes [name], MD  ibuprofen (ADVIL,MOTRIN) 800 MG tablet Take 1 tablet (800 mg total) by mouth 2 (two) times daily as needed. 07/10/18  Yes Freeman Caldron M, PA-C  metFORMIN (GLUCOPHAGE) 500 MG tablet Take 2 tablets (1,000 mg total) by mouth 2 (two) times daily with a meal. 07/10/18  Yes Naydeen Speirs M, PA-C  lidocaine (XYLOCAINE) 5 % ointment Apply 1 application topically daily as needed. Apply to affected areas. Patient not taking: Reported on 02/27/2018 09/25/17   Alfonse Spruce, FNP  methocarbamol (ROBAXIN) 500 MG tablet Take 1 tablet (500 mg total) by mouth every 8 (eight) hours as needed for muscle spasms. 07/10/18   Argentina Donovan, PA-C     Objective:  EXAM:   Vitals:   07/10/18 0957  BP: (!) 147/89  Pulse: 85  Resp: 18  Temp: 98.5 F (36.9 C)  TempSrc: Oral  SpO2: 99%  Weight: 155 lb 12.8 oz (70.7 kg)  Height: 5\' 3"  (1.6 m)    General appearance : A&OX3. NAD. Non-toxic-appearing HEENT: Atraumatic and Normocephalic.  PERRLA. EOM intact.  Neck: supple, no JVD. No cervical lymphadenopathy. No thyromegaly Chest/Lungs:  Breathing-non-labored, Good air entry bilaterally, breath sounds normal without rales, rhonchi, or wheezing  CVS: S1 S2 regular, no murmurs, gallops, rubs  Back-full S&ROM.  No bony TTP in lower back.  +paraspinus spasms B lower back.  +TTp over S-I joint.  Hip stable with internal/external rotation.  Some pain with internal rotation Extremities: Bilateral Lower Ext shows no edema, both legs are warm to touch with = pulse throughout Neurology:  CN II-XII grossly intact, Non focal.   Psych:  TP linear. J/I WNL. Normal speech. Appropriate eye contact and affect.  Skin:  No Rash  Data Review Lab Results  Component Value Date   HGBA1C 6.8 06/12/2018    HGBA1C 8.4 02/27/2018   HGBA1C 6.9 09/25/2017     Assessment & Plan   1. Acute right-sided low back pain without sciatica Likely musculoskeletal - ibuprofen (ADVIL,MOTRIN) 800 MG tablet; Take 1 tablet (800 mg total) by mouth 2 (two) times daily as needed.  Dispense: 60 tablet; Refill: 1 - methocarbamol (ROBAXIN) 500 MG tablet; Take 1 tablet (500 mg total) by mouth every 8 (eight) hours as needed for muscle spasms.  Dispense: 90 tablet; Refill: 0 - DG Lumbar Spine Complete; Future  2. Type 2 diabetes mellitus without complication, without long-term current use of insulin (HCC) Uncontrolled.  Work on diet, increase water and increase glipizide.   - Glucose (CBG) - glipiZIDE (GLUCOTROL) 5 MG tablet; Take 1 tablet (5 mg total) by mouth daily before breakfast.  Dispense: 90 tablet; Refill: 1 - metFORMIN (GLUCOPHAGE) 500 MG tablet; Take 2 tablets (1,000 mg total) by mouth 2 (two) times daily with a meal.  Dispense: 280 tablet; Refill: 1  3. Mixed hyperlipidemia - atorvastatin (LIPITOR) 80 MG tablet; Take 1 tablet (80 mg total) by mouth daily.  Dispense: 90 tablet; Refill: 1  4. Language barrier [facility] interpreters used and additional time performing visit was required. [place] translated.       Patient have been counseled extensively about nutrition and exercise  Return for keep 11/26 appt.  The patient was given clear instructions to go to ER or return to medical center if symptoms don't improve, worsen or new problems develop. The patient verbalized understanding. The patient was told to call to get lab results if they haven't heard anything in the next week.     Freeman Caldron, PA-C [facility] [place], [place]   07/10/2018, 10:14 AM

## 2018-07-15 ENCOUNTER — Telehealth: Payer: Self-pay | Admitting: *Deleted

## 2018-07-15 NOTE — Telephone Encounter (Signed)
Medical Assistant used [employer] to contact patient.  Interpreter Name: Maurice Small #: [id number] !!!Please inform patient of xray showing arthritis but no bony abnormalities where her pain is located. Patient most likely pulled a muscle and that is where the pain is coming from. Please follow up as planned!!!

## 2018-07-15 NOTE — Telephone Encounter (Signed)
-----   Message from Argentina Donovan, [place] sent at 07/10/2018  4:35 PM EDT ----- Please call patient.  Her xrays show arthritis but no abnormality of the bone where her pain is.  I think the pain is coming from a pulled muscle.  Follow-up as planned. Thanks, Freeman Caldron, PA-C

## 2018-07-17 ENCOUNTER — Telehealth: Payer: Self-pay | Admitting: Family Medicine

## 2018-07-17 NOTE — Telephone Encounter (Addendum)
Pt came in to request her x-ray results. via interpreter [phone] she received the note left by her nurse. She understood and agreed to continue using her current medications as prescribed, she started to cry but did not want to speak about it [place]

## 2018-09-16 ENCOUNTER — Ambulatory Visit: Payer: Self-pay | Admitting: Family Medicine

## 2018-09-29 ENCOUNTER — Ambulatory Visit: Payer: Self-pay | Admitting: Family Medicine

## 2018-11-04 ENCOUNTER — Telehealth: Payer: Self-pay | Admitting: Family Medicine

## 2018-11-04 MED FILL — metFORMIN HCL 500 MG TABS: 500 | 30 days supply | Qty: 120 | Fill #1

## 2018-11-04 MED FILL — glipiZIDE 5 MG TABS: 5 | 30 days supply | Qty: 30 | Fill #1

## 2018-11-04 MED FILL — ATORVASTATIN 80 MG TABLET: 80 | 30 days supply | Qty: 30 | Fill #1

## 2018-11-04 NOTE — Telephone Encounter (Signed)
Pt had refills available w/ [facility] pharmacy, refills were submitted and will be ready by tomorrow AM. No further action required.

## 2018-11-04 NOTE — Telephone Encounter (Signed)
1) Medication(s) Requested (by name):METFORMIN & LIPITOR & GLIPIZIDE  2) Pharmacy of Choice:[facility]  3) Special Requests:has appt scheduled 12/16/18   Approved medications will be sent to the pharmacy, we will reach out if there is an issue.  Requests made after 3pm may not be addressed until the following business day!  If a patient is unsure of the name of the medication(s) please note and ask patient to call back when they are able to provide all info, do not send to responsible party until all information is available!

## 2018-11-06 ENCOUNTER — Telehealth: Payer: Self-pay | Admitting: Family Medicine

## 2018-11-06 ENCOUNTER — Other Ambulatory Visit: Payer: Self-pay | Admitting: Physician Assistant

## 2018-11-06 DIAGNOSIS — M545 Low back pain, unspecified: Secondary | ICD-10-CM

## 2018-11-06 MED FILL — CLINDAMYCIN PHOS-BENZOYL PE: 1-5 | 30 days supply | Qty: 25 | Fill #1

## 2018-11-06 MED FILL — METHOCARBAMOL 500 MG TABS: 500 | 30 days supply | Qty: 90 | Fill #0

## 2018-11-06 NOTE — Telephone Encounter (Signed)
1) Medication(s) Requested (by name): -methocarbamol (ROBAXIN) 500 MG tablet   2) Pharmacy of Choice: -[facility] 3) Special Requests:   Approved medications will be sent to the pharmacy, we will reach out if there is an issue.  Requests made after 3pm may not be addressed until the following business day!  If a patient is unsure of the name of the medication(s) please note and ask patient to call back when they are able to provide all info, do not send to responsible party until all information is available!

## 2018-11-06 NOTE — Telephone Encounter (Signed)
RX already sent to pharmacy. No further action is required.

## 2018-12-12 MED FILL — glipiZIDE 5 MG TABS: 5 | 30 days supply | Qty: 30 | Fill #2

## 2018-12-15 ENCOUNTER — Other Ambulatory Visit: Payer: Self-pay | Admitting: Pharmacist

## 2018-12-15 DIAGNOSIS — E119 Type 2 diabetes mellitus without complications: Secondary | ICD-10-CM

## 2018-12-15 MED ORDER — ONETOUCH VERIO W/DEVICE KIT: PACK | 0 refills | Status: DC

## 2018-12-15 MED ORDER — GLUCOSE BLOOD VI STRP: ORAL_STRIP | 2 refills | Status: DC

## 2018-12-15 MED ORDER — ONETOUCH DELICA LANCETS 33G MISC: 2 refills | Status: DC

## 2018-12-15 MED FILL — ATORVASTATIN 80 MG TABLET: 80 | 30 days supply | Qty: 30 | Fill #2

## 2018-12-15 MED FILL — metFORMIN HCL 500 MG TABS: 500 | 20 days supply | Qty: 80 | Fill #2

## 2018-12-16 ENCOUNTER — Ambulatory Visit: Payer: Self-pay | Admitting: Family Medicine

## 2018-12-30 ENCOUNTER — Telehealth: Payer: Self-pay | Admitting: Family Medicine

## 2018-12-30 DIAGNOSIS — E119 Type 2 diabetes mellitus without complications: Secondary | ICD-10-CM

## 2018-12-30 MED ORDER — CONTOUR NEXT MONITOR W/DEVICE KIT: 1.0000 [IU] | PACK | Freq: Every day | 0 refills | Status: DC

## 2018-12-30 MED ORDER — MICROLET LANCETS MISC: 12 refills | Status: DC

## 2018-12-30 MED ORDER — GLUCOSE BLOOD VI STRP: ORAL_STRIP | 12 refills | Status: DC

## 2018-12-30 NOTE — Telephone Encounter (Signed)
The patients insurance will only pay for [facility] if she is insulin dependent. They prefer the Contour brand test trips, lancet and meter.  Spoke with patient using [employer] intpreter [phone] with [employer]. Patient understood and would like the contour system to be sent to [facility] on [address], she was given my extension to call if there are any further issues.

## 2018-12-30 NOTE — Telephone Encounter (Signed)
Patient came in and said insurance will only pay for a freestyle libre wants it sent to [facility] on [address]

## 2019-01-02 ENCOUNTER — Other Ambulatory Visit: Payer: Self-pay

## 2019-01-02 ENCOUNTER — Encounter (HOSPITAL_COMMUNITY): Payer: Self-pay | Admitting: Emergency Medicine

## 2019-01-02 ENCOUNTER — Ambulatory Visit (HOSPITAL_COMMUNITY)
Admission: EM | Admit: 2019-01-02 | Discharge: 2019-01-02 | Disposition: A | Discharge status: Home / Self Care | Payer: BLUE CROSS/BLUE SHIELD | Attending: Family Medicine | Admitting: Family Medicine

## 2019-01-02 ENCOUNTER — Ambulatory Visit (INDEPENDENT_AMBULATORY_CARE_PROVIDER_SITE_OTHER): Payer: BLUE CROSS/BLUE SHIELD

## 2019-01-02 DIAGNOSIS — M545 Low back pain, unspecified: Secondary | ICD-10-CM

## 2019-01-02 DIAGNOSIS — R1011 Right upper quadrant pain: Secondary | ICD-10-CM | POA: Diagnosis not present

## 2019-01-02 DIAGNOSIS — R1900 Intra-abdominal and pelvic swelling, mass and lump, unspecified site: Secondary | ICD-10-CM

## 2019-01-02 DIAGNOSIS — R1031 Right lower quadrant pain: Secondary | ICD-10-CM | POA: Diagnosis not present

## 2019-01-02 DIAGNOSIS — M791 Myalgia, unspecified site: Secondary | ICD-10-CM | POA: Diagnosis not present

## 2019-01-02 LAB — POCT PREGNANCY, URINE: PREG TEST UR: NEGATIVE

## 2019-01-02 IMAGING — DX ABDOMEN - 1 VIEW
1 series · 1 of 1 positions shown · non-contrast
Comparison: None.

CLINICAL DATA: Right-sided abdominal pain.

EXAM:
ABDOMEN - 1 VIEW

[abdomen kub]
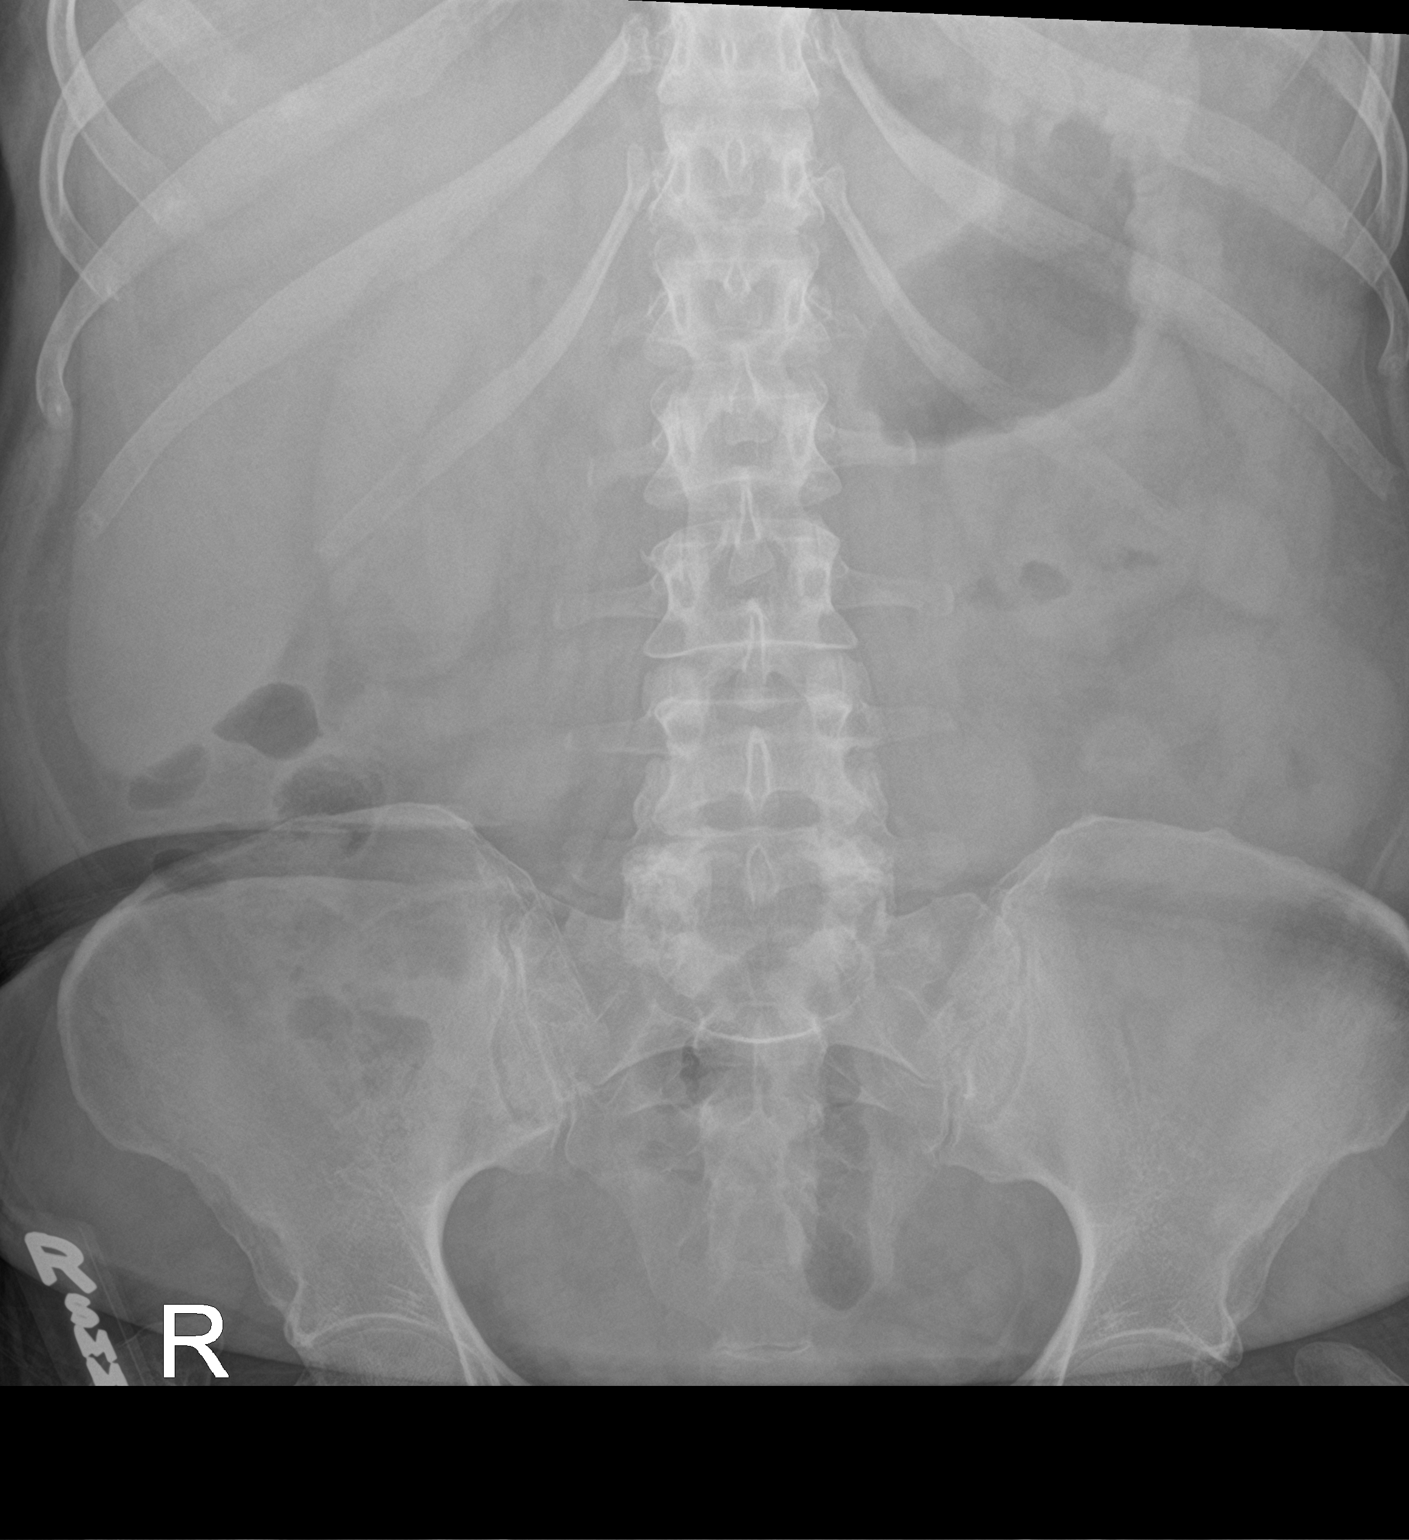

[1 of 1 positions shown; findings below may reference images not displayed]

FINDINGS: The bowel gas pattern is normal. No radio-opaque calculi or other
significant radiographic abnormality are seen.
IMPRESSION: No evidence of bowel obstruction or ileus.

## 2019-01-02 MED ORDER — CYCLOBENZAPRINE HCL 10 MG PO TABS: 5.0000 mg | ORAL_TABLET | Freq: Every day | ORAL | 0 refills | Status: DC

## 2019-01-02 MED ORDER — NAPROXEN 500 MG PO TABS: 500.0000 mg | ORAL_TABLET | Freq: Two times a day (BID) | ORAL | 0 refills | Status: DC

## 2019-01-02 MED FILL — NAPROXEN 500 MG TABLET: 500 | 15 days supply | Qty: 30 | Fill #0

## 2019-01-02 MED FILL — CYCLOBENZAPRINE 10 MG TAB: 10 | 30 days supply | Qty: 15 | Fill #0

## 2019-01-02 NOTE — ED Provider Notes (Signed)
[name]    CSN: [id number] Arrival date & time: 01/02/19  1047     History   Chief Complaint Chief Complaint  Patient presents with  . Shoulder Pain    HPI Rebecca [name] is a 53 y.o. female.   Patient is a 53 year old female with past medical history of arthritis, diabetes, hyperlipidemia that presents with right upper quadrant/flank pain that has been ongoing over the past week.  She feels as if the pain is getting worse.  She is complaining of swelling to the abdomen.  She also picks up a lot of heavy objects at work daily.  The pain is worse with certain movements and pressing on the rib muscles.  She has been taking ibuprofen for the pain which helps at times.  She denies any associated fevers, shortness of breath, cough, congestion, urinary symptoms.  No nausea, vomiting, diarrhea.  She is reporting normal bowel movements. Patient's last menstrual period was 12/06/2018. She would like to be checked for pregnancy.  ROS per HPI     Shoulder Pain    Past Medical History:  Diagnosis Date  . Arthritis   . Diabetes mellitus   . Hyperlipidemia     Patient Active Problem List   Diagnosis Date Noted  . Diabetes ([place]) 03/11/2017  . Muscle pain 08/06/2013  . Tiredness 08/06/2013  . Candida vaginitis 08/19/2012  . Dental cavities 08/19/2012  . Knee pain, bilateral 04/16/2012  . Diabetes mellitus type 2, controlled ([place]) 04/16/2012  . Hyperlipidemia 04/16/2012  . Palpitations 04/16/2012  . Back pain 04/16/2012    Past Surgical History:  Procedure Laterality Date  . CESAREAN SECTION      OB History   No obstetric history on file.      Home Medications    Prior to Admission medications   Medication Sig Start Date End Date Taking? Authorizing Provider  glipiZIDE (GLUCOTROL) 5 MG tablet Take 1 tablet (5 mg total) by mouth daily before breakfast. 07/10/18  Yes McClung, Angela M, PA-C  glucose blood (CONTOUR NEXT TEST) test strip Use as directed  to test blood sugar up to three times daily. 12/30/18  Yes [name], MD  hydrocortisone cream 0.5 % Apply 1 application topically 2 (two) times daily. 02/27/18  Yes [name], MD  ibuprofen (ADVIL,MOTRIN) 800 MG tablet Take 1 tablet (800 mg total) by mouth 2 (two) times daily as needed. 07/10/18  Yes Argentina Donovan, PA-C  lidocaine (XYLOCAINE) 5 % ointment Apply 1 application topically daily as needed. Apply to affected areas. 09/25/17  Yes Alfonse Spruce, FNP  metFORMIN (GLUCOPHAGE) 500 MG tablet Take 2 tablets (1,000 mg total) by mouth 2 (two) times daily with a meal. 07/10/18  Yes McClung, Angela M, PA-C  methocarbamol (ROBAXIN) 500 MG tablet TAKE 1 TABLET BY MOUTH EVERY 8 HOURS AS NEEDED FOR MUSCLE SPASMS. 11/06/18  Yes [name], MD  Microlet Lancets MISC Use as directed to test blood sugar up to three times daily. 12/30/18  Yes [name], MD  acetaminophen (TYLENOL) 500 MG tablet Take 1,000 mg by mouth daily as needed for mild pain. Reported on 01/16/2016    [provider]  atorvastatin (LIPITOR) 80 MG tablet Take 1 tablet (80 mg total) by mouth daily. 07/10/18   Argentina Donovan, PA-C  Blood Glucose Monitoring Suppl (CONTOUR NEXT MONITOR) w/Device KIT 1 Units by Does not apply route daily. Use as directed to test blood sugar up to three times daily. 12/30/18  [name], MD  clindamycin-benzoyl peroxide (BENZACLIN) gel Apply topically 2 (two) times daily. 06/12/18   [name], MD  cyclobenzaprine (FLEXERIL) 10 MG tablet Take 0.5 tablets (5 mg total) by mouth at bedtime. 01/02/19   Frederik Standley, Tressia Miners A, NP  Dapsone 5 % topical gel APPLY ONE PEA SIZED AMOUNT TO FACE TWICE A DAY. 06/26/17   Hairston, Toy Baker R, FNP  naproxen (NAPROSYN) 500 MG tablet Take 1 tablet (500 mg total) by mouth 2 (two) times daily. 01/02/19   Orvan July, NP    Family History Family History  Problem Relation Age of Onset  . Diabetes Mother   . Heart disease Mother   . Diabetes  Father   . Cancer Father 34       Throat  . Diabetes Sister   . Diabetes Brother     Social History Social History   Tobacco Use  . Smoking status: Passive Smoke Exposure - Never Smoker  . Smokeless tobacco: Never Used  Substance Use Topics  . Alcohol use: No  . Drug use: No     Allergies   Patient has no known allergies.   Review of Systems Review of Systems   Physical Exam Triage Vital Signs ED Triage Vitals [01/02/19 1105]  Enc Vitals Group     BP 138/87     Pulse Rate 95     Resp 20     Temp 98.7 F (37.1 C)     Temp Source Temporal     SpO2 100 %     Weight      Height      Head Circumference      Peak Flow      Pain Score 6     Pain Loc      Pain Edu?      Excl. in [place]?    No data found.  Updated Vital Signs BP 138/87 (BP Location: Right Arm)   Pulse 95   Temp 98.7 F (37.1 C) (Temporal)   Resp 20   LMP 12/06/2018 (Exact Date) Comment: Negative pregnancy test  SpO2 100%   Visual Acuity Right Eye Distance:   Left Eye Distance:   Bilateral Distance:    Right Eye Near:   Left Eye Near:    Bilateral Near:     Physical Exam Vitals signs and nursing note reviewed.  Constitutional:      General: She is not in acute distress.    Appearance: Normal appearance. She is not ill-appearing, toxic-appearing or diaphoretic.  HENT:     Head: Normocephalic and atraumatic.     Nose: Nose normal.  Eyes:     Conjunctiva/sclera: Conjunctivae normal.  Cardiovascular:     Rate and Rhythm: Normal rate and regular rhythm.     Pulses: Normal pulses.     Heart sounds: Normal heart sounds.  Pulmonary:     Effort: Pulmonary effort is normal.     Breath sounds: Normal breath sounds.  Abdominal:     General: Bowel sounds are normal.     Palpations: Abdomen is soft.     Tenderness: There is abdominal tenderness.       Comments: Tender to palpation of the RUQ more in the ribs. There is mild visible swelling to the right rib area. Mildly tender to tough.   Negative Murphy sign No hepatomegaly.    Neurological:     Mental Status: She is alert.      UC Treatments / Results  Labs (all labs  ordered are listed, but only abnormal results are displayed) Labs Reviewed  POC URINE PREG, ED  POCT PREGNANCY, URINE    EKG None  Radiology Dg Abd 1 View  Result Date: 01/02/2019 CLINICAL DATA:  Right-sided abdominal pain. EXAM: ABDOMEN - 1 VIEW COMPARISON:  None. FINDINGS: The bowel gas pattern is normal. No radio-opaque calculi or other significant radiographic abnormality are seen. IMPRESSION: No evidence of bowel obstruction or ileus. Electronically Signed   By: Marijo Conception, M.D.   On: 01/02/2019 12:27    Procedures Procedures (including critical care time)  Medications Ordered in UC Medications - No data to display  Initial Impression / Assessment and Plan / UC Course  I have reviewed the triage vital signs and the nursing notes.  Pertinent labs & imaging results that were available during my care of the patient were reviewed by me and considered in my medical decision making (see chart for details).    Right rib/muscle pain  X-ray was negative for any abnormalities. Urine negative for pregnancy  Patient's last menstrual period was 12/06/2018 (exact date).  No acute abdomen on exam and abdomen soft.  Negative murphy's sign No hepatomegaly.  VSS, no toxic or ill appearing.  Pt tender over the right rib muscles and there is mild swelling No deformities, erythema or bruising.  Most likely strained muscle or muscle spasm The pain is somewhat radiating into her back at times.  Pain is worse with certain movements.  We will try a low dose muscle relaxant and NSAID to see if this helps her symptoms.  Instructed that if her symptoms worsen then she needs to go to the ER.  Pt agrees    Final Clinical Impressions(s) / UC Diagnoses   Final diagnoses:  Muscle pain     Discharge Instructions     Your x-ray was normal  Your pregnancy test was negative. I believe this is muscle pain We will do naproxen twice a day with food and have you take a low-dose muscle relaxer to see if this helps Follow up as needed for continued or worsening symptoms     ED Prescriptions    Medication Sig Dispense Auth. Provider   cyclobenzaprine (FLEXERIL) 10 MG tablet Take 0.5 tablets (5 mg total) by mouth at bedtime. 20 tablet Antron Seth A, NP   naproxen (NAPROSYN) 500 MG tablet Take 1 tablet (500 mg total) by mouth 2 (two) times daily. 30 tablet Loura Halt A, NP     Controlled Substance Prescriptions [place] Controlled Substance Registry consulted? Not Applicable   Orvan July, NP 01/02/19 1320

## 2019-01-02 NOTE — ED Triage Notes (Signed)
C/o right shoulder pain onset x1 week that is subsiding but also reports abd/epigastric pain  Reports she picks up heavy obj at work constantly   Denies fevers, dyspnea, SOB, urinary sx, diaphoresis, headaches  A&O x4... NAD.Marland Kitchen. ambulatory

## 2019-01-02 NOTE — Discharge Instructions (Addendum)
Your x-ray was normal Your pregnancy test was negative. I believe this is muscle pain We will do naproxen twice a day with food and have you take a low-dose muscle relaxer to see if this helps Follow up as needed for continued or worsening symptoms

## 2019-01-13 MED FILL — metFORMIN HCL 500 MG TABS: 500 | 30 days supply | Qty: 120 | Fill #1

## 2019-01-13 MED FILL — glipiZIDE 5 MG TABS: 5 | 30 days supply | Qty: 30 | Fill #3

## 2019-01-13 MED FILL — ATORVASTATIN 80 MG TABLET: 80 | 30 days supply | Qty: 30 | Fill #3

## 2019-02-20 MED FILL — glipiZIDE 5 MG TABS: 5 | 30 days supply | Qty: 30 | Fill #1

## 2019-02-20 MED FILL — ATORVASTATIN 80 MG TABLET: 80 | 30 days supply | Qty: 30 | Fill #1

## 2019-02-20 MED FILL — metFORMIN HCL 500 MG TABS: 500 | 30 days supply | Qty: 120 | Fill #2

## 2019-03-30 ENCOUNTER — Other Ambulatory Visit: Payer: Self-pay | Admitting: Family Medicine

## 2019-03-30 DIAGNOSIS — E119 Type 2 diabetes mellitus without complications: Secondary | ICD-10-CM

## 2019-03-30 MED FILL — ATORVASTATIN 80 MG TABLET: 80 | 30 days supply | Qty: 30 | Fill #2

## 2019-03-30 MED FILL — metFORMIN HCL 500 MG TABS: 500 | 30 days supply | Qty: 120 | Fill #3

## 2019-04-06 MED FILL — ?GLIPIZIDE 5MG TABLET: 5 | 30 days supply | Qty: 15 | Fill #1

## 2019-04-07 MED FILL — LISINOPRIL 20 MG TABLET: 20 | 30 days supply | Qty: 30 | Fill #0

## 2019-04-20 MED FILL — CYCLOBENZAPRINE 10 MG TAB: 10 | 10 days supply | Qty: 5 | Fill #1

## 2019-04-22 MED FILL — glipiZIDE 5 MG TABS: 5 | 30 days supply | Qty: 30 | Fill #0

## 2019-05-08 MED FILL — ATORVASTATIN 80 MG TABLET: 80 | 30 days supply | Qty: 30 | Fill #0

## 2019-05-08 MED FILL — metFORMIN HCL 500 MG TABS: 500 | 30 days supply | Qty: 120 | Fill #0

## 2019-05-11 MED FILL — AMLODIPINE BESYLATE 10 MG T: 10 | 90 days supply | Qty: 90 | Fill #0

## 2019-05-27 MED FILL — glipiZIDE 5 MG TABS: 5 | 30 days supply | Qty: 30 | Fill #1

## 2019-07-12 ENCOUNTER — Encounter (HOSPITAL_COMMUNITY): Payer: Self-pay | Admitting: Family Medicine

## 2019-07-12 ENCOUNTER — Other Ambulatory Visit: Payer: Self-pay

## 2019-07-12 ENCOUNTER — Ambulatory Visit (HOSPITAL_COMMUNITY)
Admission: EM | Admit: 2019-07-12 | Discharge: 2019-07-12 | Disposition: A | Discharge status: Home / Self Care | Payer: BLUE CROSS/BLUE SHIELD | Attending: Family Medicine | Admitting: Family Medicine

## 2019-07-12 DIAGNOSIS — R600 Localized edema: Secondary | ICD-10-CM | POA: Diagnosis not present

## 2019-07-12 DIAGNOSIS — R002 Palpitations: Secondary | ICD-10-CM | POA: Diagnosis not present

## 2019-07-12 LAB — CBC WITH DIFFERENTIAL/PLATELET
Abs Immature Granulocytes: 0.06 10*3/uL (ref 0.00–0.07)
Basophils Absolute: 0 10*3/uL (ref 0.0–0.1)
Basophils Relative: 0 %
Eosinophils Absolute: 0.3 10*3/uL (ref 0.0–0.5)
Eosinophils Relative: 3 %
HCT: 42.5 % (ref 36.0–46.0)
Hemoglobin: 13.6 g/dL (ref 12.0–15.0)
Immature Granulocytes: 1 %
Lymphocytes Relative: 32 %
Lymphs Abs: 2.8 10*3/uL (ref 0.7–4.0)
MCH: 26.3 pg (ref 26.0–34.0)
MCHC: 32 g/dL (ref 30.0–36.0)
MCV: 82 fL (ref 80.0–100.0)
Monocytes Absolute: 0.5 10*3/uL (ref 0.1–1.0)
Monocytes Relative: 6 %
Neutro Abs: 5.3 10*3/uL (ref 1.7–7.7)
Neutrophils Relative %: 58 %
Platelets: 349 10*3/uL (ref 150–400)
RBC: 5.18 MIL/uL — ABNORMAL HIGH (ref 3.87–5.11)
RDW: 13.5 % (ref 11.5–15.5)
WBC: 8.9 10*3/uL (ref 4.0–10.5)
nRBC: 0 % (ref 0.0–0.2)

## 2019-07-12 LAB — POCT URINALYSIS DIP (DEVICE)
Bilirubin Urine: NEGATIVE
Glucose, UA: NEGATIVE mg/dL
Hgb urine dipstick: NEGATIVE
Ketones, ur: NEGATIVE mg/dL
Leukocytes,Ua: NEGATIVE
Nitrite: NEGATIVE
Protein, ur: NEGATIVE mg/dL
Specific Gravity, Urine: 1.025 (ref 1.005–1.030)
Urobilinogen, UA: 0.2 mg/dL (ref 0.0–1.0)
pH: 7 (ref 5.0–8.0)

## 2019-07-12 LAB — COMPREHENSIVE METABOLIC PANEL
ALT: 27 U/L (ref 0–44)
AST: 51 U/L — ABNORMAL HIGH (ref 15–41)
Albumin: 4.3 g/dL (ref 3.5–5.0)
Alkaline Phosphatase: 65 U/L (ref 38–126)
Anion gap: 11 (ref 5–15)
BUN: 10 mg/dL (ref 6–20)
CO2: 26 mmol/L (ref 22–32)
Calcium: 9.3 mg/dL (ref 8.9–10.3)
Chloride: 99 mmol/L (ref 98–111)
Creatinine, Ser: 0.46 mg/dL (ref 0.44–1.00)
GFR calc Af Amer: >60 mL/min (ref >60)
GFR calc non Af Amer: >60 mL/min (ref >60)
Glucose, Bld: 145 mg/dL — ABNORMAL HIGH (ref 70–99)
Potassium: 4.5 mmol/L (ref 3.5–5.1)
Sodium: 136 mmol/L (ref 135–145)
Total Bilirubin: 1.7 mg/dL — ABNORMAL HIGH (ref 0.3–1.2)
Total Protein: 7.4 g/dL (ref 6.5–8.1)

## 2019-07-12 LAB — TSH: TSH: 1.361 u[IU]/mL (ref 0.350–4.500)

## 2019-07-12 NOTE — ED Provider Notes (Signed)
[facility]    CSN: [id number] Arrival date & time: 07/12/19  1239      History   Chief Complaint Chief Complaint  Patient presents with  . Palpitations  . Leg Pain    HPI Rebecca [name] is a 54 y.o. female.   This is a 53 year old woman who presents with lower extremity swelling.  She is an established [employer] urgent care patient.  She states that for the last week or so she has had mild swelling in her lower extremities and has palpitations, particularly at night.  Patient has got past medical history for diabetes and hyperlipidemia.  Patient states that a couple years ago her husband went back to [place].  When he was there he married another woman.  Then her husband was in the hospital for a long period of time and is still sick.  Does not look like he is coming back.  Patient has some friends here but no family, she is gravida 0.  Patient is very depressed.     Past Medical History:  Diagnosis Date  . Arthritis   . Diabetes mellitus   . Hyperlipidemia     Patient Active Problem List   Diagnosis Date Noted  . Diabetes ([place]) 03/11/2017  . Muscle pain 08/06/2013  . Tiredness 08/06/2013  . Candida vaginitis 08/19/2012  . Dental cavities 08/19/2012  . Knee pain, bilateral 04/16/2012  . Diabetes mellitus type 2, controlled ([place]) 04/16/2012  . Hyperlipidemia 04/16/2012  . Palpitations 04/16/2012  . Back pain 04/16/2012    Past Surgical History:  Procedure Laterality Date  . CESAREAN SECTION      OB History   No obstetric history on file.      Home Medications    Prior to Admission medications   Medication Sig Start Date End Date Taking? Authorizing Provider  acetaminophen (TYLENOL) 500 MG tablet Take 1,000 mg by mouth daily as needed for mild pain. Reported on 01/16/2016    [provider]  atorvastatin (LIPITOR) 80 MG tablet Take 1 tablet (80 mg total) by mouth daily. 07/10/18   Argentina Donovan, PA-C  Blood Glucose  Monitoring Suppl (CONTOUR NEXT MONITOR) w/Device KIT 1 Units by Does not apply route daily. Use as directed to test blood sugar up to three times daily. 12/30/18   [name], MD  clindamycin-benzoyl peroxide (BENZACLIN) gel Apply topically 2 (two) times daily. 06/12/18   [name], MD  Dapsone 5 % topical gel APPLY ONE PEA SIZED AMOUNT TO FACE TWICE A DAY. 06/26/17   Hairston, Maylon Peppers, FNP  glipiZIDE (GLUCOTROL) 5 MG tablet Take 1 tablet (5 mg total) by mouth daily before breakfast. 07/10/18   Argentina Donovan, PA-C  glucose blood (CONTOUR NEXT TEST) test strip Use as directed to test blood sugar up to three times daily. 12/30/18   [name], MD  ibuprofen (ADVIL,MOTRIN) 800 MG tablet Take 1 tablet (800 mg total) by mouth 2 (two) times daily as needed. 07/10/18   Argentina Donovan, PA-C  lidocaine (XYLOCAINE) 5 % ointment Apply 1 application topically daily as needed. Apply to affected areas. 09/25/17   Alfonse Spruce, FNP  metFORMIN (GLUCOPHAGE) 500 MG tablet Take 2 tablets (1,000 mg total) by mouth 2 (two) times daily with a meal. 07/10/18   McClung, Dionne Bucy, PA-C  Microlet Lancets MISC Use as directed to test blood sugar up to three times daily. 12/30/18   [name], MD  naproxen (NAPROSYN) 500 MG tablet Take  1 tablet (500 mg total) by mouth 2 (two) times daily. 01/02/19   Orvan July, NP    Family History Family History  Problem Relation Age of Onset  . Diabetes Mother   . Heart disease Mother   . Diabetes Father   . Cancer Father 17       Throat  . Diabetes Sister   . Diabetes Brother     Social History Social History   Tobacco Use  . Smoking status: Passive Smoke Exposure - Never Smoker  . Smokeless tobacco: Never Used  Substance Use Topics  . Alcohol use: No  . Drug use: No     Allergies   Patient has no known allergies.   Review of Systems Review of Systems  Cardiovascular: Positive for palpitations and leg swelling.   Psychiatric/Behavioral: Positive for dysphoric mood.  All other systems reviewed and are negative.    Physical Exam Triage Vital Signs ED Triage Vitals  Enc Vitals Group     BP      Pulse      Resp      Temp      Temp src      SpO2      Weight      Height      Head Circumference      Peak Flow      Pain Score      Pain Loc      Pain Edu?      Excl. in [place]?    No data found.  Updated Vital Signs BP (!) 160/93 (BP Location: Right Arm)   Pulse (!) 115   Temp 98.2 F (36.8 C) (Oral)   Resp 18   SpO2 100%   Physical Exam Vitals signs and nursing note reviewed.  Constitutional:      Appearance: Normal appearance.  HENT:     Head: Normocephalic and atraumatic.  Eyes:     Conjunctiva/sclera: Conjunctivae normal.  Neck:     Musculoskeletal: Normal range of motion and neck supple.  Cardiovascular:     Rate and Rhythm: Tachycardia present.     Pulses: Normal pulses.     Heart sounds: Normal heart sounds.  Pulmonary:     Effort: Pulmonary effort is normal.     Breath sounds: Normal breath sounds.  Musculoskeletal: Normal range of motion.        General: Swelling present. No tenderness, deformity or signs of injury.     Comments: Patient has 1+ bilateral pedal edema.  Skin:    General: Skin is warm and dry.  Neurological:     General: No focal deficit present.     Mental Status: She is alert.  Psychiatric:     Comments: Patient is crying through much of the interview.      UC Treatments / Results  Labs (all labs ordered are listed, but only abnormal results are displayed) Labs Reviewed  COMPREHENSIVE METABOLIC PANEL  CBC WITH DIFFERENTIAL/PLATELET  TSH    EKG Twelve-lead EKG shows borderline tachycardia in 1 PVC but is otherwise normal.  Radiology No results found.  Procedures Procedures (including critical care time)  Medications Ordered in UC Medications - No data to display  Initial Impression / Assessment and Plan / UC Course  I have  reviewed the triage vital signs and the nursing notes.  Pertinent labs & imaging results that were available during my care of the patient were reviewed by me and considered in my medical decision making (see  chart for details).    Final Clinical Impressions(s) / UC Diagnoses   Final diagnoses:  Palpitations  Pedal edema     Discharge Instructions     I am doing blood and urine test and will call you when the results are back at [phone]    ED Prescriptions    None     I have reviewed the PDMP during this encounter.   Robyn Haber, MD 07/12/19 1415

## 2019-07-12 NOTE — ED Triage Notes (Signed)
Pt sts palpitations and leg pain; pt sts increased stress at home and is tearful

## 2019-07-12 NOTE — Discharge Instructions (Signed)
I am doing blood and urine test and will call you when the results are back at [phone]

## 2019-07-29 ENCOUNTER — Other Ambulatory Visit: Payer: Self-pay

## 2019-07-29 ENCOUNTER — Emergency Department (HOSPITAL_COMMUNITY)
Admission: EM | Admit: 2019-07-29 | Discharge: 2019-07-29 | Disposition: A | Discharge status: Home / Self Care | Payer: BLUE CROSS/BLUE SHIELD | Attending: Emergency Medicine | Admitting: Emergency Medicine

## 2019-07-29 ENCOUNTER — Encounter (HOSPITAL_COMMUNITY): Payer: Self-pay

## 2019-07-29 ENCOUNTER — Emergency Department (HOSPITAL_COMMUNITY): Payer: BLUE CROSS/BLUE SHIELD

## 2019-07-29 DIAGNOSIS — Z79899 Other long term (current) drug therapy: Secondary | ICD-10-CM | POA: Insufficient documentation

## 2019-07-29 DIAGNOSIS — Z7984 Long term (current) use of oral hypoglycemic drugs: Secondary | ICD-10-CM | POA: Insufficient documentation

## 2019-07-29 DIAGNOSIS — E119 Type 2 diabetes mellitus without complications: Secondary | ICD-10-CM | POA: Insufficient documentation

## 2019-07-29 DIAGNOSIS — Z7722 Contact with and (suspected) exposure to environmental tobacco smoke (acute) (chronic): Secondary | ICD-10-CM | POA: Diagnosis not present

## 2019-07-29 DIAGNOSIS — R002 Palpitations: Secondary | ICD-10-CM | POA: Insufficient documentation

## 2019-07-29 LAB — D-DIMER, QUANTITATIVE: D-Dimer, Quant: 0.36 ug/mL-FEU (ref 0.00–0.50)

## 2019-07-29 LAB — CBC WITH DIFFERENTIAL/PLATELET
Abs Immature Granulocytes: 0.04 10*3/uL (ref 0.00–0.07)
Basophils Absolute: 0 10*3/uL (ref 0.0–0.1)
Basophils Relative: 0 %
Eosinophils Absolute: 0.2 10*3/uL (ref 0.0–0.5)
Eosinophils Relative: 2 %
HCT: 39.2 % (ref 36.0–46.0)
Hemoglobin: 12.8 g/dL (ref 12.0–15.0)
Immature Granulocytes: 1 %
Lymphocytes Relative: 41 %
Lymphs Abs: 3 10*3/uL (ref 0.7–4.0)
MCH: 27.2 pg (ref 26.0–34.0)
MCHC: 32.7 g/dL (ref 30.0–36.0)
MCV: 83.2 fL (ref 80.0–100.0)
Monocytes Absolute: 0.4 10*3/uL (ref 0.1–1.0)
Monocytes Relative: 5 %
Neutro Abs: 3.6 10*3/uL (ref 1.7–7.7)
Neutrophils Relative %: 51 %
Platelets: 304 10*3/uL (ref 150–400)
RBC: 4.71 MIL/uL (ref 3.87–5.11)
RDW: 13.5 % (ref 11.5–15.5)
WBC: 7.2 10*3/uL (ref 4.0–10.5)
nRBC: 0.3 % — ABNORMAL HIGH (ref 0.0–0.2)

## 2019-07-29 LAB — CBG MONITORING, ED: Glucose-Capillary: 280 mg/dL — ABNORMAL HIGH (ref 70–99)

## 2019-07-29 LAB — COMPREHENSIVE METABOLIC PANEL
ALT: 29 U/L (ref 0–44)
AST: 68 U/L — ABNORMAL HIGH (ref 15–41)
Albumin: 4.2 g/dL (ref 3.5–5.0)
Alkaline Phosphatase: 68 U/L (ref 38–126)
Anion gap: 13 (ref 5–15)
BUN: 9 mg/dL (ref 6–20)
CO2: 23 mmol/L (ref 22–32)
Calcium: 9.2 mg/dL (ref 8.9–10.3)
Chloride: 101 mmol/L (ref 98–111)
Creatinine, Ser: 0.53 mg/dL (ref 0.44–1.00)
GFR calc Af Amer: >60 mL/min (ref >60)
GFR calc non Af Amer: >60 mL/min (ref >60)
Glucose, Bld: 153 mg/dL — ABNORMAL HIGH (ref 70–99)
Potassium: 5.7 mmol/L — ABNORMAL HIGH (ref 3.5–5.1)
Sodium: 137 mmol/L (ref 135–145)
Total Bilirubin: 2 mg/dL — ABNORMAL HIGH (ref 0.3–1.2)
Total Protein: 7.2 g/dL (ref 6.5–8.1)

## 2019-07-29 LAB — TROPONIN I (HIGH SENSITIVITY): Troponin I (High Sensitivity): <2 ng/L (ref <18)

## 2019-07-29 LAB — ETHANOL: Alcohol, Ethyl (B): <10 mg/dL (ref <10)

## 2019-07-29 IMAGING — CR DG CHEST 2V
2 series · 2 of 2 positions shown · non-contrast
Comparison: [DATE]

CLINICAL DATA: Shortness of breath and palpitations.

EXAM:
CHEST - 2 VIEW

[chest pa]
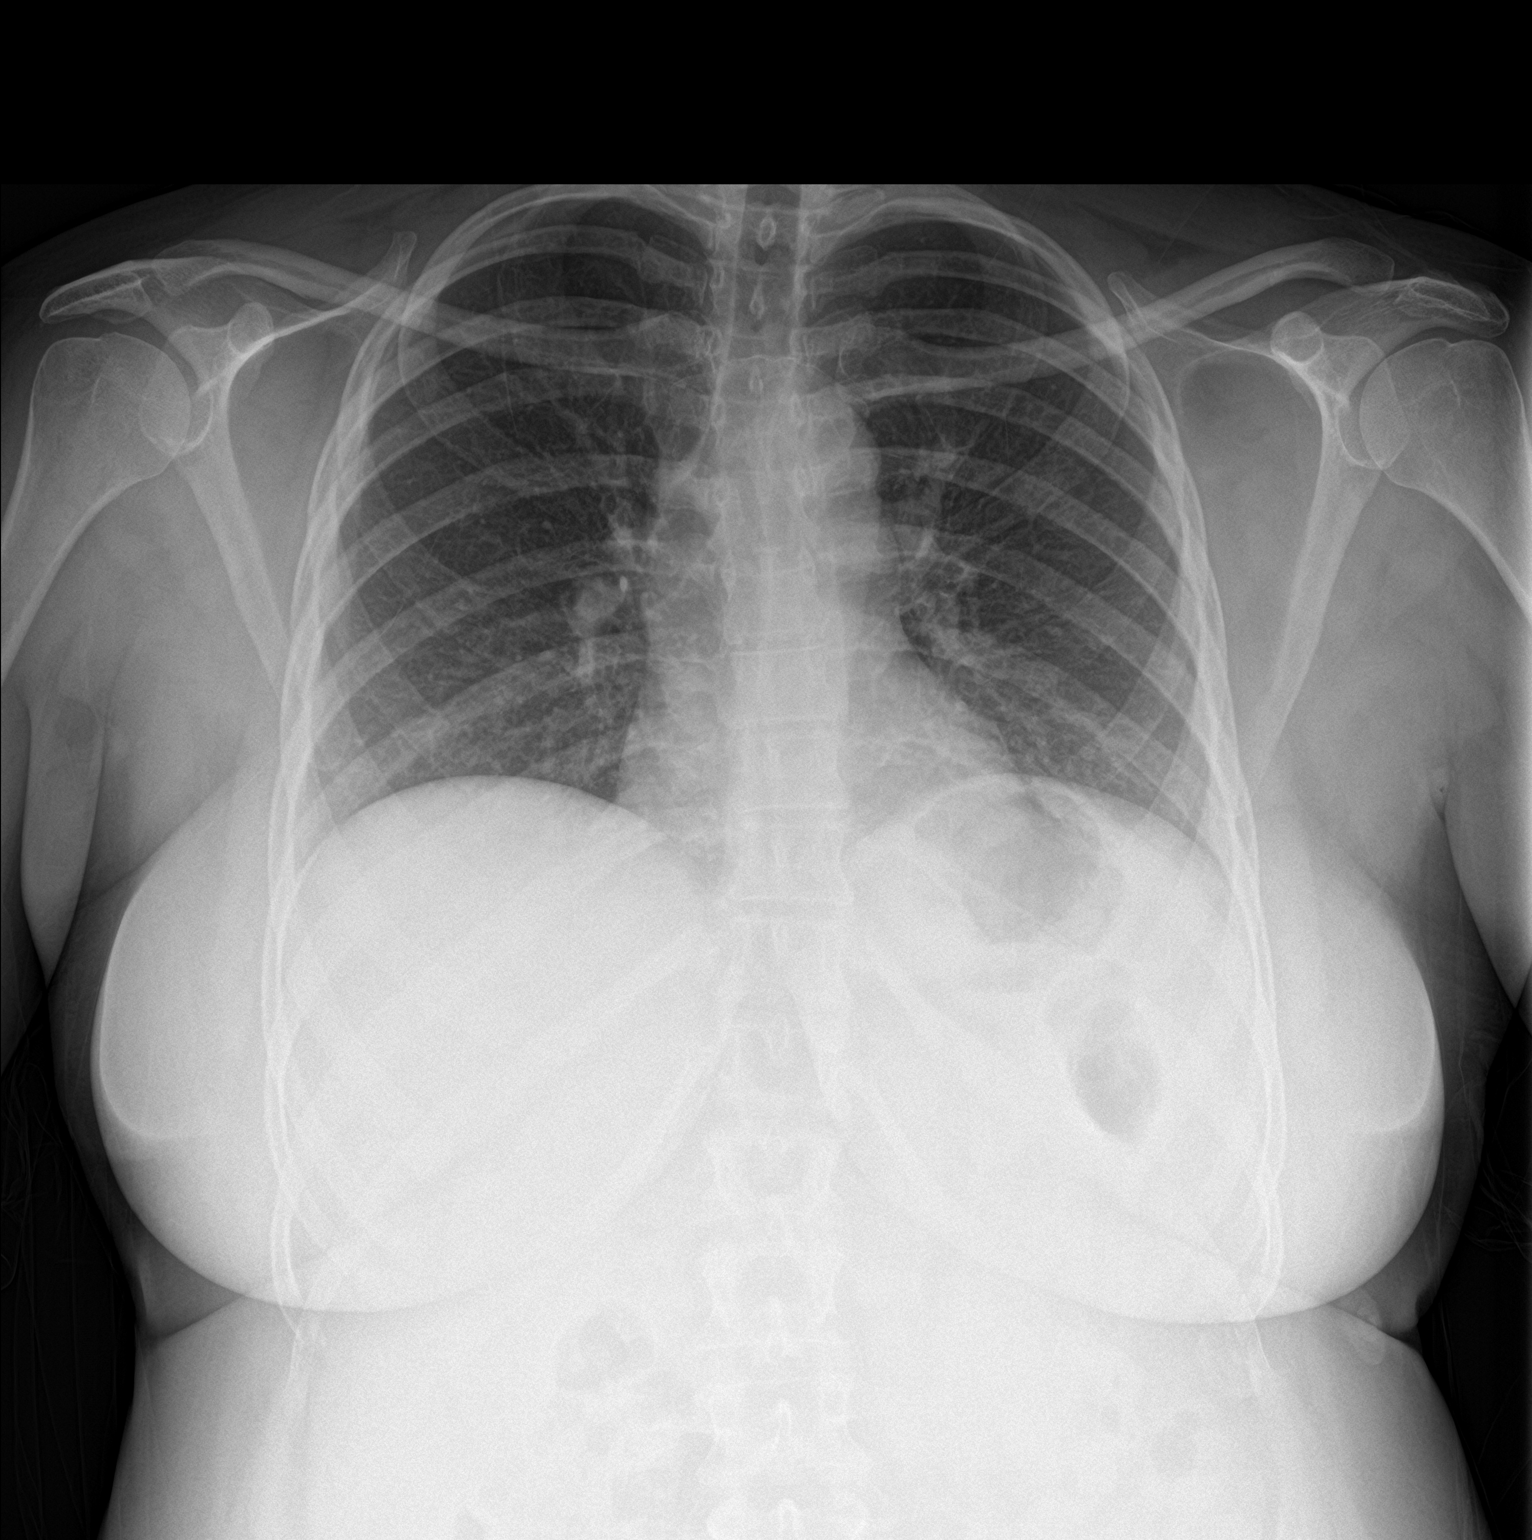

[chest lat]
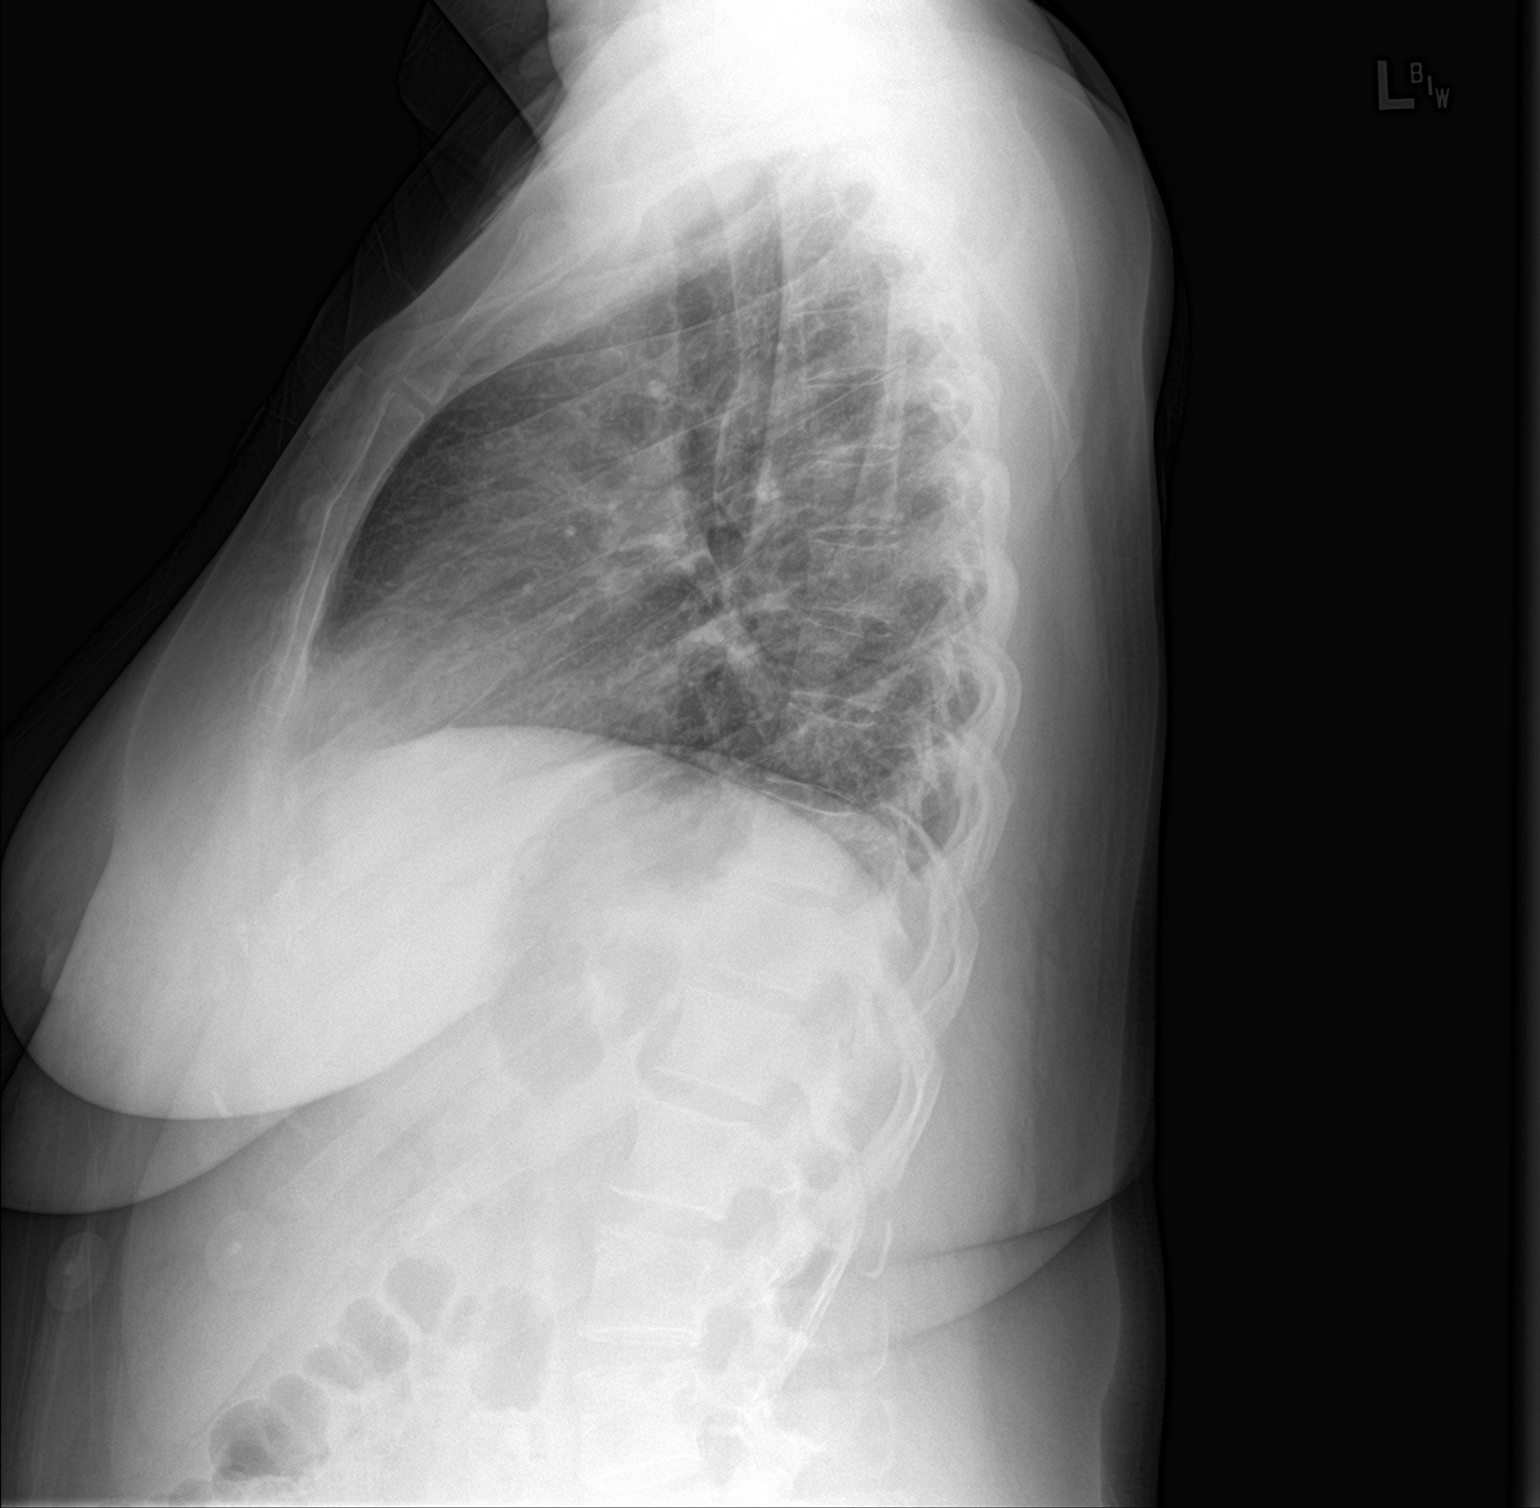

[2 of 2 positions shown; findings below may reference images not displayed]

FINDINGS: The lungs are clear without focal pneumonia, edema, pneumothorax or
pleural effusion. The cardiopericardial silhouette is within normal
limits for size. The visualized bony structures of the thorax are
intact.
IMPRESSION: No active cardiopulmonary disease.

## 2019-07-29 MED ORDER — PROPRANOLOL HCL 10 MG PO TABS: 10.0000 mg | ORAL_TABLET | Freq: Three times a day (TID) | ORAL | 0 refills | Status: DC | PRN

## 2019-07-29 MED ORDER — METOPROLOL TARTRATE 5 MG/5ML IV SOLN
5.0000 mg | Freq: Once | INTRAVENOUS | Status: AC
  Administered 2019-07-29: 21:00:00 5 mg via INTRAVENOUS
  Filled 2019-07-29: qty 5

## 2019-07-29 MED ORDER — SODIUM CHLORIDE 0.9 % IV BOLUS
1000.0000 mL | Freq: Once | INTRAVENOUS | Status: AC
  Administered 2019-07-29: 1000 mL via INTRAVENOUS

## 2019-07-29 MED FILL — glipiZIDE 5 MG TABS: 5 | 30 days supply | Qty: 30 | Fill #2

## 2019-07-29 MED FILL — ATORVASTATIN 80 MG TABLET: 80 | 30 days supply | Qty: 30 | Fill #1

## 2019-07-29 NOTE — Discharge Instructions (Signed)
Take propranolol as needed for palpitations   See your doctor   Return to ER if you have worse palpitations, chest pain, shortness of breath

## 2019-07-29 NOTE — ED Provider Notes (Signed)
[facility] EMERGENCY DEPARTMENT Provider Note   CSN: [id number] Arrival date & time: 07/29/19  1628     History   Chief Complaint No chief complaint on file.   HPI Rebecca [name] is a 53 y.o. female hx of DM, HL, here presenting with palpitations, anxiety.  Patient states that she has been having sinus congestion and palpitation for the last 3 weeks or so.  She states that her husband who is since again is having health problems and she is very worried.  States that the palpitations are intermittent and when she has palpitations she gets very short of breath.  She denies any closures to COVID.  Denies any recent travel or history of blood clots.  She was seen at urgent care several weeks ago and has a normal blood work as well as TSH.  She denies any suicidal homicidal ideations.    The history is provided by the patient. A language interpreter was used.  Arabic language interpreter services used   Past Medical History:  Diagnosis Date   Arthritis    Diabetes mellitus    Hyperlipidemia     Patient Active Problem List   Diagnosis Date Noted   Diabetes ([place]) 03/11/2017   Muscle pain 08/06/2013   Tiredness 08/06/2013   Candida vaginitis 08/19/2012   Dental cavities 08/19/2012   Knee pain, bilateral 04/16/2012   Diabetes mellitus type 2, controlled ([place]) 04/16/2012   Hyperlipidemia 04/16/2012   Palpitations 04/16/2012   Back pain 04/16/2012    Past Surgical History:  Procedure Laterality Date   CESAREAN SECTION       OB History   No obstetric history on file.      Home Medications    Prior to Admission medications   Medication Sig Start Date End Date Taking? Authorizing Provider  acetaminophen (TYLENOL) 500 MG tablet Take 1,000 mg by mouth daily as needed for mild pain. Reported on 01/16/2016    [provider]  atorvastatin (LIPITOR) 80 MG tablet Take 1 tablet (80 mg total) by mouth daily. 07/10/18   Argentina Donovan, PA-C  Blood Glucose Monitoring Suppl (CONTOUR NEXT MONITOR) w/Device KIT 1 Units by Does not apply route daily. Use as directed to test blood sugar up to three times daily. 12/30/18   [name], MD  clindamycin-benzoyl peroxide (BENZACLIN) gel Apply topically 2 (two) times daily. 06/12/18   [name], MD  Dapsone 5 % topical gel APPLY ONE PEA SIZED AMOUNT TO FACE TWICE A DAY. 06/26/17   Hairston, Maylon Peppers, FNP  glipiZIDE (GLUCOTROL) 5 MG tablet Take 1 tablet (5 mg total) by mouth daily before breakfast. 07/10/18   Argentina Donovan, PA-C  glucose blood (CONTOUR NEXT TEST) test strip Use as directed to test blood sugar up to three times daily. 12/30/18   [name], MD  ibuprofen (ADVIL,MOTRIN) 800 MG tablet Take 1 tablet (800 mg total) by mouth 2 (two) times daily as needed. 07/10/18   Argentina Donovan, PA-C  lidocaine (XYLOCAINE) 5 % ointment Apply 1 application topically daily as needed. Apply to affected areas. 09/25/17   Alfonse Spruce, FNP  metFORMIN (GLUCOPHAGE) 500 MG tablet Take 2 tablets (1,000 mg total) by mouth 2 (two) times daily with a meal. 07/10/18   McClung, Dionne Bucy, PA-C  Microlet Lancets MISC Use as directed to test blood sugar up to three times daily. 12/30/18   [name], MD  naproxen (NAPROSYN) 500 MG tablet Take 1 tablet (500  mg total) by mouth 2 (two) times daily. 01/02/19   Orvan July, NP    Family History Family History  Problem Relation Age of Onset   Diabetes Mother    Heart disease Mother    Diabetes Father    Cancer Father 69       Throat   Diabetes Sister    Diabetes Brother     Social History Social History   Tobacco Use   Smoking status: Passive Smoke Exposure - Never Smoker   Smokeless tobacco: Never Used  Substance Use Topics   Alcohol use: No   Drug use: No     Allergies   Patient has no known allergies.   Review of Systems Review of Systems  Cardiovascular: Positive for palpitations.    Psychiatric/Behavioral: The patient is nervous/anxious.   All other systems reviewed and are negative.    Physical Exam Updated Vital Signs BP 120/78    Pulse 86    Temp (!) 97.4 F (36.3 C) (Oral)    Resp 14    Ht 5' 5" (1.651 m)    Wt 77.1 kg    SpO2 99%    BMI 28.29 kg/m   Physical Exam Vitals signs and nursing note reviewed.  Constitutional:      Comments: Anxious   HENT:     Head: Normocephalic.     Mouth/Throat:     Mouth: Mucous membranes are moist.  Eyes:     Extraocular Movements: Extraocular movements intact.     Pupils: Pupils are equal, round, and reactive to light.  Neck:     Musculoskeletal: Normal range of motion.  Cardiovascular:     Rate and Rhythm: Regular rhythm. Tachycardia present.     Pulses: Normal pulses.     Heart sounds: Normal heart sounds.  Pulmonary:     Effort: Pulmonary effort is normal.     Breath sounds: Normal breath sounds.  Abdominal:     General: Abdomen is flat.     Palpations: Abdomen is soft.  Skin:    General: Skin is warm.     Capillary Refill: Capillary refill takes less than 2 seconds.  Neurological:     General: No focal deficit present.     Mental Status: She is oriented to person, place, and time.  Psychiatric:        Mood and Affect: Mood normal.        Behavior: Behavior normal.     Comments: Anxious not depressed or suicidal       ED Treatments / Results  Labs (all labs ordered are listed, but only abnormal results are displayed) Labs Reviewed  CBG MONITORING, ED - Abnormal; Notable for the following components:      Result Value   Glucose-Capillary 280 (*)    All other components within normal limits  CBC WITH DIFFERENTIAL/PLATELET  COMPREHENSIVE METABOLIC PANEL  ETHANOL  D-DIMER, QUANTITATIVE (NOT AT [facility])  TROPONIN I (HIGH SENSITIVITY)    EKG EKG Interpretation  Date/Time:  Wednesday July 29 2019 16:35:01 EDT Ventricular Rate:  119 PR Interval:  124 QRS Duration: 80 QT Interval:  298 QTC  Calculation: 419 R Axis:   93 Text Interpretation:  Sinus tachycardia Rightward axis ST & T wave abnormality, consider inferior ischemia Abnormal ECG Since last tracing rate faster Confirmed by Wandra Arthurs [phone]) on 07/29/2019 8:32:09 PM   Radiology Dg Chest 2 View  Result Date: 07/29/2019 CLINICAL DATA:  Shortness of breath and palpitations. EXAM: CHEST -  2 VIEW COMPARISON:  12/17/2013 FINDINGS: The lungs are clear without focal pneumonia, edema, pneumothorax or pleural effusion. The cardiopericardial silhouette is within normal limits for size. The visualized bony structures of the thorax are intact. IMPRESSION: No active cardiopulmonary disease. Electronically Signed   By: Misty Stanley M.D.   On: 07/29/2019 17:13    Procedures Procedures (including critical care time)  Medications Ordered in ED Medications  metoprolol tartrate (LOPRESSOR) injection 5 mg (has no administration in time range)  sodium chloride 0.9 % bolus 1,000 mL (has no administration in time range)     Initial Impression / Assessment and Plan / ED Course  I have reviewed the triage vital signs and the nursing notes.  Pertinent labs & imaging results that were available during my care of the patient were reviewed by me and considered in my medical decision making (see chart for details).       Rebecca [name] is a 53 y.o. female here with palpitations, anxiety. Tachycardic in the ED. TSH normal several weeks ago. CBG was 280. Will check labs, D-dimer, CXR. Will give beta blockers for symptomatic relief. Likely panic attacks.   10:44 PM Labs including D-dimer negative. K 5.7 but hemolyzed. Trop neg. Likely panic attacked. HR 80s after beta blocker. Will prescribe propranolol prn for palpitations and will help with panic attacks   Final Clinical Impressions(s) / ED Diagnoses   Final diagnoses:  None    ED Discharge Orders    None       Drenda Freeze, MD 07/29/19 2245

## 2019-07-29 NOTE — ED Triage Notes (Signed)
Patient complains of ongoing palpitations and SOB, patient became tearful in triage and states that she has a lot of personal stress at home. Denies any depression but worried about her spouse who is ill

## 2019-08-05 MED FILL — AMLODIPINE BESYLATE 10 MG T: 10 | 90 days supply | Qty: 90 | Fill #1

## 2019-08-05 MED FILL — metFORMIN HCL 500 MG TABS: 500 | 30 days supply | Qty: 120 | Fill #1

## 2019-09-02 MED FILL — glipiZIDE 5 MG TABS: 5 | 30 days supply | Qty: 30 | Fill #0

## 2019-09-02 MED FILL — metFORMIN HCL 500 MG TABS: 500 | 30 days supply | Qty: 120 | Fill #2

## 2019-09-02 MED FILL — ATORVASTATIN 80 MG TABLET: 80 | 30 days supply | Qty: 30 | Fill #2

## 2019-09-28 MED FILL — ATORVASTATIN 80 MG TABLET: 80 | 30 days supply | Qty: 30 | Fill #0

## 2019-09-28 MED FILL — metFORMIN HCL 500 MG TABS: 500 | 30 days supply | Qty: 120 | Fill #0

## 2019-09-28 MED FILL — JARDIANCE 25 MG TABLET: 25 | 30 days supply | Qty: 30 | Fill #0

## 2019-10-12 MED FILL — AMLODIPINE BESYLATE 10 MG T: 10 | 90 days supply | Qty: 90 | Fill #0

## 2019-11-04 ENCOUNTER — Encounter: Payer: Self-pay | Admitting: Family Medicine

## 2019-11-04 ENCOUNTER — Other Ambulatory Visit: Payer: Self-pay

## 2019-11-04 ENCOUNTER — Ambulatory Visit: Payer: 59 | Attending: Family Medicine | Admitting: Family Medicine

## 2019-11-04 VITALS — BP 128/83 | HR 85 | Ht 65.0 in | Wt 171.0 lb

## 2019-11-04 DIAGNOSIS — E782 Mixed hyperlipidemia: Secondary | ICD-10-CM

## 2019-11-04 DIAGNOSIS — E119 Type 2 diabetes mellitus without complications: Secondary | ICD-10-CM | POA: Diagnosis not present

## 2019-11-04 DIAGNOSIS — Z1231 Encounter for screening mammogram for malignant neoplasm of breast: Secondary | ICD-10-CM

## 2019-11-04 DIAGNOSIS — B373 Candidiasis of vulva and vagina: Secondary | ICD-10-CM

## 2019-11-04 DIAGNOSIS — Z1211 Encounter for screening for malignant neoplasm of colon: Secondary | ICD-10-CM

## 2019-11-04 DIAGNOSIS — B3731 Acute candidiasis of vulva and vagina: Secondary | ICD-10-CM

## 2019-11-04 LAB — POCT GLYCOSYLATED HEMOGLOBIN (HGB A1C): HbA1c, POC (controlled diabetic range): 7.9 % — AB (ref 0.0–7.0)

## 2019-11-04 LAB — GLUCOSE, POCT (MANUAL RESULT ENTRY): POC Glucose: 103 mg/dl — AB (ref 70–99)

## 2019-11-04 MED ORDER — METFORMIN HCL 500 MG PO TABS: 1000.0000 mg | ORAL_TABLET | Freq: Two times a day (BID) | ORAL | 1 refills | Status: DC

## 2019-11-04 MED ORDER — CLOTRIMAZOLE 1 % EX CREA: 1.0000 "application " | TOPICAL_CREAM | Freq: Two times a day (BID) | CUTANEOUS | 1 refills | Status: DC

## 2019-11-04 MED ORDER — ATORVASTATIN CALCIUM 80 MG PO TABS: 80.0000 mg | ORAL_TABLET | Freq: Every day | ORAL | 1 refills | Status: DC

## 2019-11-04 MED ORDER — GLIPIZIDE 5 MG PO TABS: 5.0000 mg | ORAL_TABLET | Freq: Every day | ORAL | 1 refills | Status: DC

## 2019-11-04 MED FILL — ATORVASTATIN 80 MG TABLET: 80 | 90 days supply | Qty: 90 | Fill #0

## 2019-11-04 MED FILL — glipiZIDE 5 MG TABS: 5 | 90 days supply | Qty: 90 | Fill #0

## 2019-11-04 MED FILL — metFORMIN HCL 500 MG TABS: 500 | 90 days supply | Qty: 360 | Fill #0

## 2019-11-04 NOTE — Progress Notes (Signed)
Subjective:  Patient ID: Rebecca [name], female    DOB: 02/13/1966  Age: 54 y.o. MRN: [id number]  CC: Diabetes   HPI Rebecca [name] is a 54 year old female with a history of type 2 diabetes mellitus (A1c 7.9), hyperlipidemia who presents today for follow-up visit. She was last seen in the clinic in 06/2018 and states she did not return due to the fact that her insurance was not accepted here in the clinic.  She does not like Jardiance which was prescribed by Endocrine she is on last month and would like to remain on glipizide.  She states Rebecca [name] has been added due to elevated blood sugars which she attributes to being depressed and noncompliant with her medications as her husband had passed away. She is now more compliant with glipizide, Metformin and Lipitor and would like refills.  She complains of vaginal itching which responded to clotrimazole cream in the past and is requesting a refill.  Past Medical History:  Diagnosis Date  . Arthritis   . Diabetes mellitus   . Hyperlipidemia     Past Surgical History:  Procedure Laterality Date  . CESAREAN SECTION      Family History  Problem Relation Age of Onset  . Diabetes Mother   . Heart disease Mother   . Diabetes Father   . Cancer Father 70       Throat  . Diabetes Sister   . Diabetes Brother     No Known Allergies  Outpatient Medications Prior to Visit  Medication Sig Dispense Refill  . acetaminophen (TYLENOL) 500 MG tablet Take 1,000 mg by mouth daily as needed for mild pain. Reported on 01/16/2016    . atorvastatin (LIPITOR) 80 MG tablet Take 1 tablet (80 mg total) by mouth daily. 90 tablet 1  . Blood Glucose Monitoring Suppl (CONTOUR NEXT MONITOR) w/Device KIT 1 Units by Does not apply route daily. Use as directed to test blood sugar up to three times daily. 1 kit 0  . clindamycin-benzoyl peroxide (BENZACLIN) gel Apply topically 2 (two) times daily. 25 g 1  . Dapsone 5 % topical gel APPLY ONE PEA SIZED  AMOUNT TO FACE TWICE A DAY. 30 g 1  . glipiZIDE (GLUCOTROL) 5 MG tablet Take 1 tablet (5 mg total) by mouth daily before breakfast. 90 tablet 1  . glucose blood (CONTOUR NEXT TEST) test strip Use as directed to test blood sugar up to three times daily. 100 each 12  . ibuprofen (ADVIL,MOTRIN) 800 MG tablet Take 1 tablet (800 mg total) by mouth 2 (two) times daily as needed. 60 tablet 1  . lidocaine (XYLOCAINE) 5 % ointment Apply 1 application topically daily as needed. Apply to affected areas. 30 g 0  . metFORMIN (GLUCOPHAGE) 500 MG tablet Take 2 tablets (1,000 mg total) by mouth 2 (two) times daily with a meal. 280 tablet 1  . Microlet Lancets MISC Use as directed to test blood sugar up to three times daily. 100 each 12  . naproxen (NAPROSYN) 500 MG tablet Take 1 tablet (500 mg total) by mouth 2 (two) times daily. 30 tablet 0  . propranolol (INDERAL) 10 MG tablet Take 1 tablet (10 mg total) by mouth 3 (three) times daily as needed. 15 tablet 0   No facility-administered medications prior to visit.     ROS Review of Systems  Constitutional: Negative for activity change, appetite change and fatigue.  HENT: Negative for congestion, sinus pressure and sore throat.   Eyes: Negative for  visual disturbance.  Respiratory: Negative for cough, chest tightness, shortness of breath and wheezing.   Cardiovascular: Negative for chest pain and palpitations.  Gastrointestinal: Negative for abdominal distention, abdominal pain and constipation.  Endocrine: Negative for polydipsia.  Genitourinary: Negative for dysuria and frequency.  Musculoskeletal: Negative for arthralgias and back pain.  Skin: Negative for rash.  Neurological: Negative for tremors, light-headedness and numbness.  Hematological: Does not bruise/bleed easily.  Psychiatric/Behavioral: Negative for agitation and behavioral problems.    Objective:  BP 128/83   Pulse 85   Ht 5' 5"  (1.651 m)   Wt 171 lb (77.6 kg)   SpO2 100%   BMI  28.46 kg/m   BP/Weight 11/04/2019 07/29/2019 7/00/1749  Systolic BP 449 675 916  Diastolic BP 83 70 93  Wt. (Lbs) 171 170 -  BMI 28.46 28.29 -      Physical Exam  CMP Latest Ref Rng & Units 07/29/2019 07/12/2019 02/27/2018  Glucose 70 - 99 mg/dL 153(H) 145(H) 172(H)  BUN 6 - 20 mg/dL 9 10 11   Creatinine 0.44 - 1.00 mg/dL 0.53 0.46 0.50(L)  Sodium 135 - 145 mmol/L 137 136 135  Potassium 3.5 - 5.1 mmol/L 5.7(H) 4.5 4.1  Chloride 98 - 111 mmol/L 101 99 96  CO2 22 - 32 mmol/L 23 26 21   Calcium 8.9 - 10.3 mg/dL 9.2 9.3 9.5  Total Protein 6.5 - 8.1 g/dL 7.2 7.4 6.9  Total Bilirubin 0.3 - 1.2 mg/dL 2.0(H) 1.7(H) <0.2  Alkaline Phos 38 - 126 U/L 68 65 62  AST 15 - 41 U/L 68(H) 51(H) 32  ALT 0 - 44 U/L 29 27 32    Lipid Panel     Component Value Date/Time   CHOL 215 (H) 02/27/2018 1019   TRIG 507 (H) 02/27/2018 1019   HDL 43 02/27/2018 1019   CHOLHDL 5.0 (H) 02/27/2018 1019   CHOLHDL 5.5 (H) 11/02/2016 1102   VLDL 80 (H) 11/02/2016 1102   LDLCALC Comment 02/27/2018 1019    CBC    Component Value Date/Time   WBC 7.2 07/29/2019 2115   RBC 4.71 07/29/2019 2115   HGB 12.8 07/29/2019 2115   HCT 39.2 07/29/2019 2115   PLT 304 07/29/2019 2115   MCV 83.2 07/29/2019 2115   MCH 27.2 07/29/2019 2115   MCHC 32.7 07/29/2019 2115   RDW 13.5 07/29/2019 2115   LYMPHSABS 3.0 07/29/2019 2115   MONOABS 0.4 07/29/2019 2115   EOSABS 0.2 07/29/2019 2115   BASOSABS 0.0 07/29/2019 2115    Lab Results  Component Value Date   HGBA1C 7.9 (A) 11/04/2019    Assessment & Plan:   1. Type 2 diabetes mellitus without complication, without long-term current use of insulin (HCC) Uncontrolled with A1c of 7.9 She attributes suboptimal glycemic control to noncompliance at the time of her husband's passing but promises to be more compliant We will refill glipizide and Metformin Counseled on Diabetic diet, my plate method, 384 minutes of moderate intensity exercise/week Blood sugar logs with fasting  goals of 80-120 mg/dl, random of less than 180 and in the event of sugars less than 60 mg/dl or greater than 400 mg/dl encouraged to notify the clinic. Advised on the need for annual eye exams, annual foot exams, Pneumonia vaccine. - Glucose (CBG) - HgB A1c - Microalbumin/Creatinine Ratio, Urine - glipiZIDE (GLUCOTROL) 5 MG tablet; Take 1 tablet (5 mg total) by mouth daily before breakfast.  Dispense: 90 tablet; Refill: 1 - metFORMIN (GLUCOPHAGE) 500 MG tablet; Take 2 tablets (1,000  mg total) by mouth 2 (two) times daily with a meal.  Dispense: 360 tablet; Refill: 1 - Lipid panel - Complete Metabolic Panel with GFR - Ambulatory referral to Ophthalmology  2. Mixed hyperlipidemia Uncontrolled We will make no regimen changes lipid panel is abnormal as she was previously noncompliant with Lipitor Counseled on low-cholesterol diet, lifestyle modification - atorvastatin (LIPITOR) 80 MG tablet; Take 1 tablet (80 mg total) by mouth daily.  Dispense: 90 tablet; Refill: 1  3. Vaginal candidiasis - clotrimazole (CLOTRIMAZOLE AF) 1 % cream; Apply 1 application topically 2 (two) times daily.  Dispense: 30 g; Refill: 1  4. Encounter for screening mammogram for malignant neoplasm of breast - MM 3D SCREEN BREAST BILATERAL; Future  5. Screening for colon cancer - Ambulatory referral to Gastroenterology     [name], MD, FAAFP. [facility] [place], [place]   11/04/2019, 10:15 AM

## 2019-11-04 NOTE — Patient Instructions (Signed)
Once I review your test results, I will be in touch with you via 'my chart' or a phone call from my office (if you are not signed up for my chart).  

## 2019-11-04 NOTE — Progress Notes (Signed)
Patient would like to discuss BP.

## 2019-11-05 LAB — CMP14+EGFR
ALT: 18 [IU]/L (ref 0–32)
AST: 19 [IU]/L (ref 0–40)
Albumin/Globulin Ratio: 1.7 (ref 1.2–2.2)
Albumin: 4.5 g/dL (ref 3.8–4.9)
Alkaline Phosphatase: 76 [IU]/L (ref 39–117)
BUN/Creatinine Ratio: 23 (ref 9–23)
BUN: 12 mg/dL (ref 6–24)
Bilirubin Total: 0.5 mg/dL (ref 0.0–1.2)
CO2: 24 mmol/L (ref 20–29)
Calcium: 9.5 mg/dL (ref 8.7–10.2)
Chloride: 97 mmol/L (ref 96–106)
Creatinine, Ser: 0.53 mg/dL — ABNORMAL LOW (ref 0.57–1.00)
GFR calc Af Amer: 127 mL/min/{1.73_m2}
GFR calc non Af Amer: 110 mL/min/{1.73_m2}
Globulin, Total: 2.6 g/dL (ref 1.5–4.5)
Glucose: 115 mg/dL — ABNORMAL HIGH (ref 65–99)
Potassium: 4.3 mmol/L (ref 3.5–5.2)
Sodium: 137 mmol/L (ref 134–144)
Total Protein: 7.1 g/dL (ref 6.0–8.5)

## 2019-11-05 LAB — LIPID PANEL
Chol/HDL Ratio: 2.1 ratio (ref 0.0–4.4)
Cholesterol, Total: 89 mg/dL — ABNORMAL LOW (ref 100–199)
HDL: 43 mg/dL (ref >39)
LDL Chol Calc (NIH): 23 mg/dL (ref 0–99)
Triglycerides: 132 mg/dL (ref 0–149)
VLDL Cholesterol Cal: 23 mg/dL (ref 5–40)

## 2019-11-05 LAB — MICROALBUMIN / CREATININE URINE RATIO
Creatinine, Urine: 68.9 mg/dL
Microalb/Creat Ratio: <4 mg/g{creat} (ref 0–29)
Microalbumin, Urine: <3 ug/mL

## 2019-11-06 ENCOUNTER — Telehealth (INDEPENDENT_AMBULATORY_CARE_PROVIDER_SITE_OTHER): Payer: Self-pay

## 2019-11-06 NOTE — Telephone Encounter (Signed)
Patient name and DOB has been verified Patient was informed of lab results. Patient had no questions.  

## 2019-11-06 NOTE — Telephone Encounter (Signed)
-----   Message from [name], MD sent at 11/06/2019  9:54 AM EST ----- Please inform the patient that labs are normal. Thank you.

## 2019-11-21 ENCOUNTER — Ambulatory Visit (HOSPITAL_COMMUNITY)
Admission: EM | Admit: 2019-11-21 | Discharge: 2019-11-21 | Disposition: A | Discharge status: Home / Self Care | Payer: 59 | Attending: Family Medicine | Admitting: Family Medicine

## 2019-11-21 ENCOUNTER — Other Ambulatory Visit: Payer: Self-pay

## 2019-11-21 ENCOUNTER — Encounter (HOSPITAL_COMMUNITY): Payer: Self-pay

## 2019-11-21 ENCOUNTER — Ambulatory Visit (INDEPENDENT_AMBULATORY_CARE_PROVIDER_SITE_OTHER): Payer: 59

## 2019-11-21 DIAGNOSIS — R000 Tachycardia, unspecified: Secondary | ICD-10-CM | POA: Diagnosis not present

## 2019-11-21 DIAGNOSIS — R059 Cough, unspecified: Secondary | ICD-10-CM

## 2019-11-21 DIAGNOSIS — N898 Other specified noninflammatory disorders of vagina: Secondary | ICD-10-CM | POA: Insufficient documentation

## 2019-11-21 DIAGNOSIS — R05 Cough: Secondary | ICD-10-CM

## 2019-11-21 DIAGNOSIS — B373 Candidiasis of vulva and vagina: Secondary | ICD-10-CM | POA: Diagnosis not present

## 2019-11-21 DIAGNOSIS — B3731 Acute candidiasis of vulva and vagina: Secondary | ICD-10-CM

## 2019-11-21 LAB — POCT RAPID STREP A: Streptococcus, Group A Screen (Direct): NEGATIVE

## 2019-11-21 IMAGING — DX DG CHEST 2V
2 series · 2 of 2 positions shown · non-contrast
Comparison: [DATE]

CLINICAL DATA: Cough for 3 weeks. Tachycardia. Hypertension.
Diabetes.

EXAM:
CHEST - 2 VIEW

[chest pa]
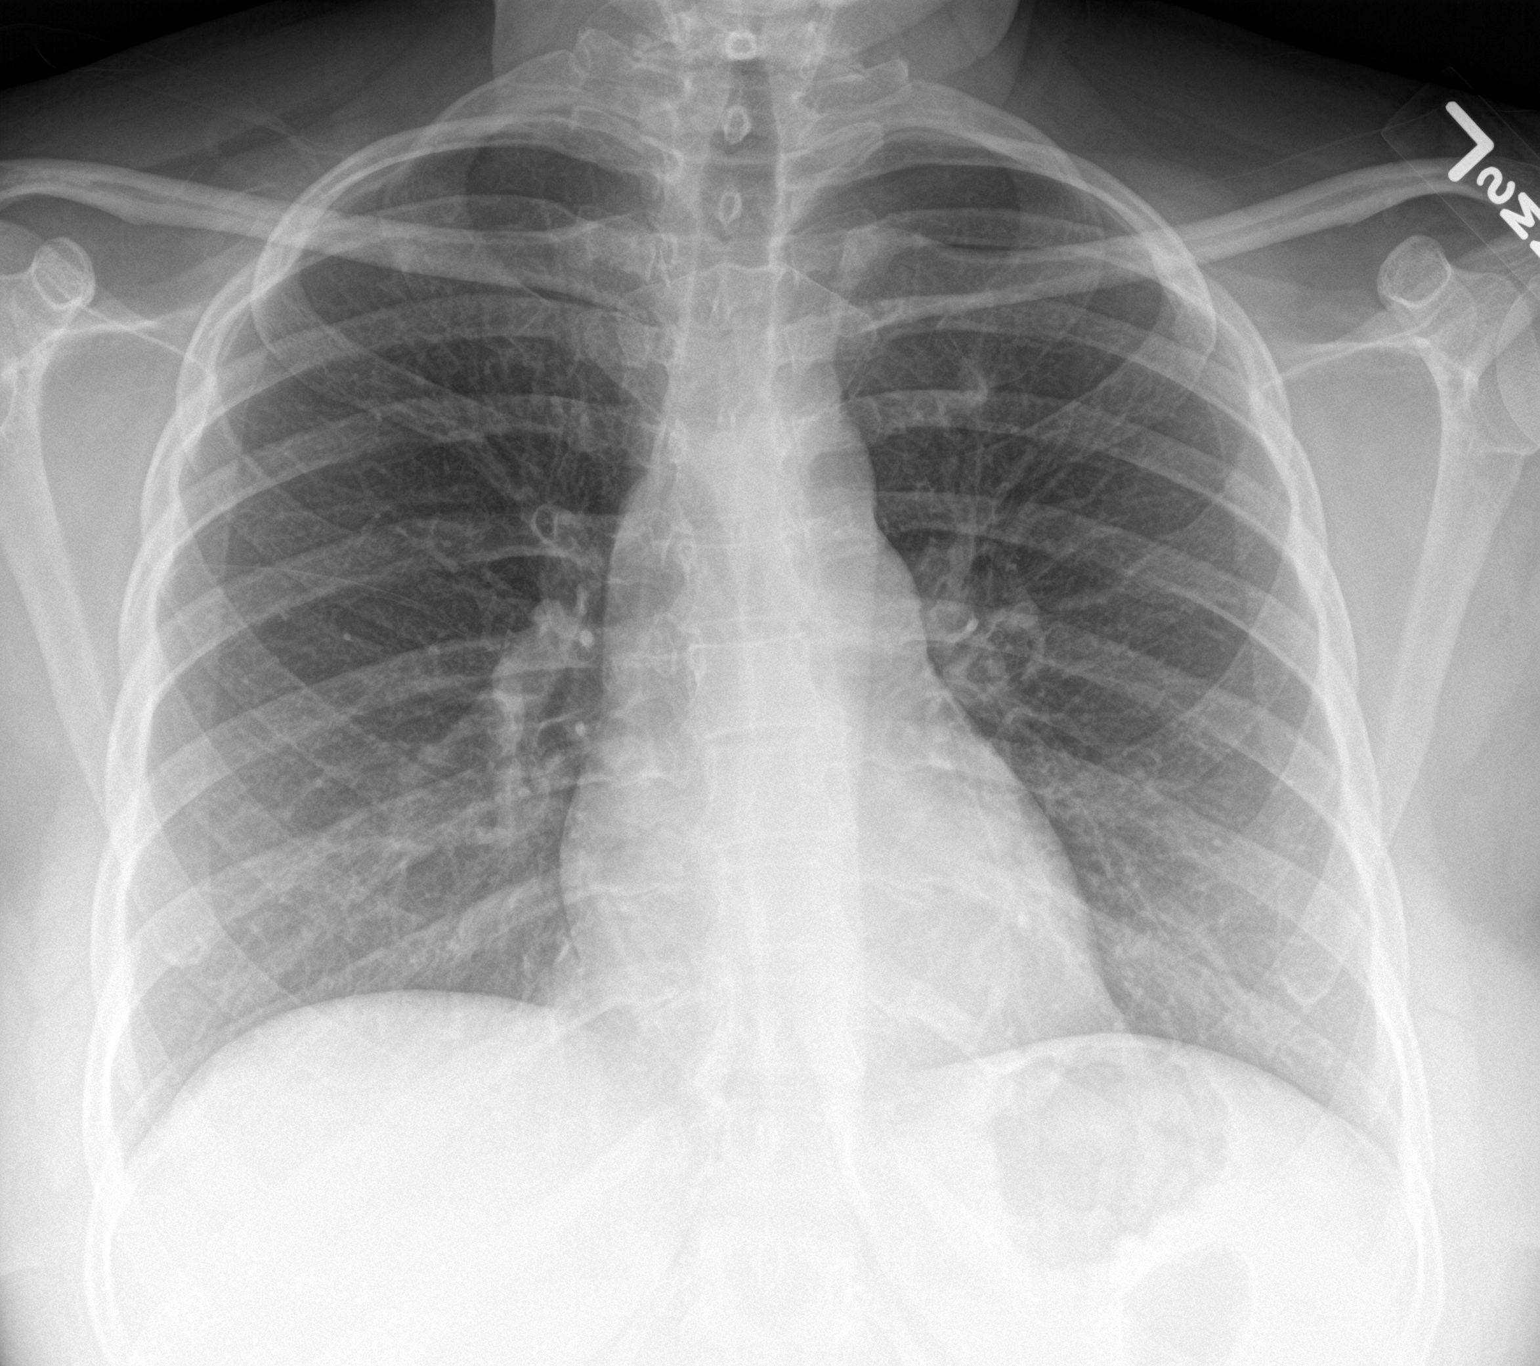

[chest lat]
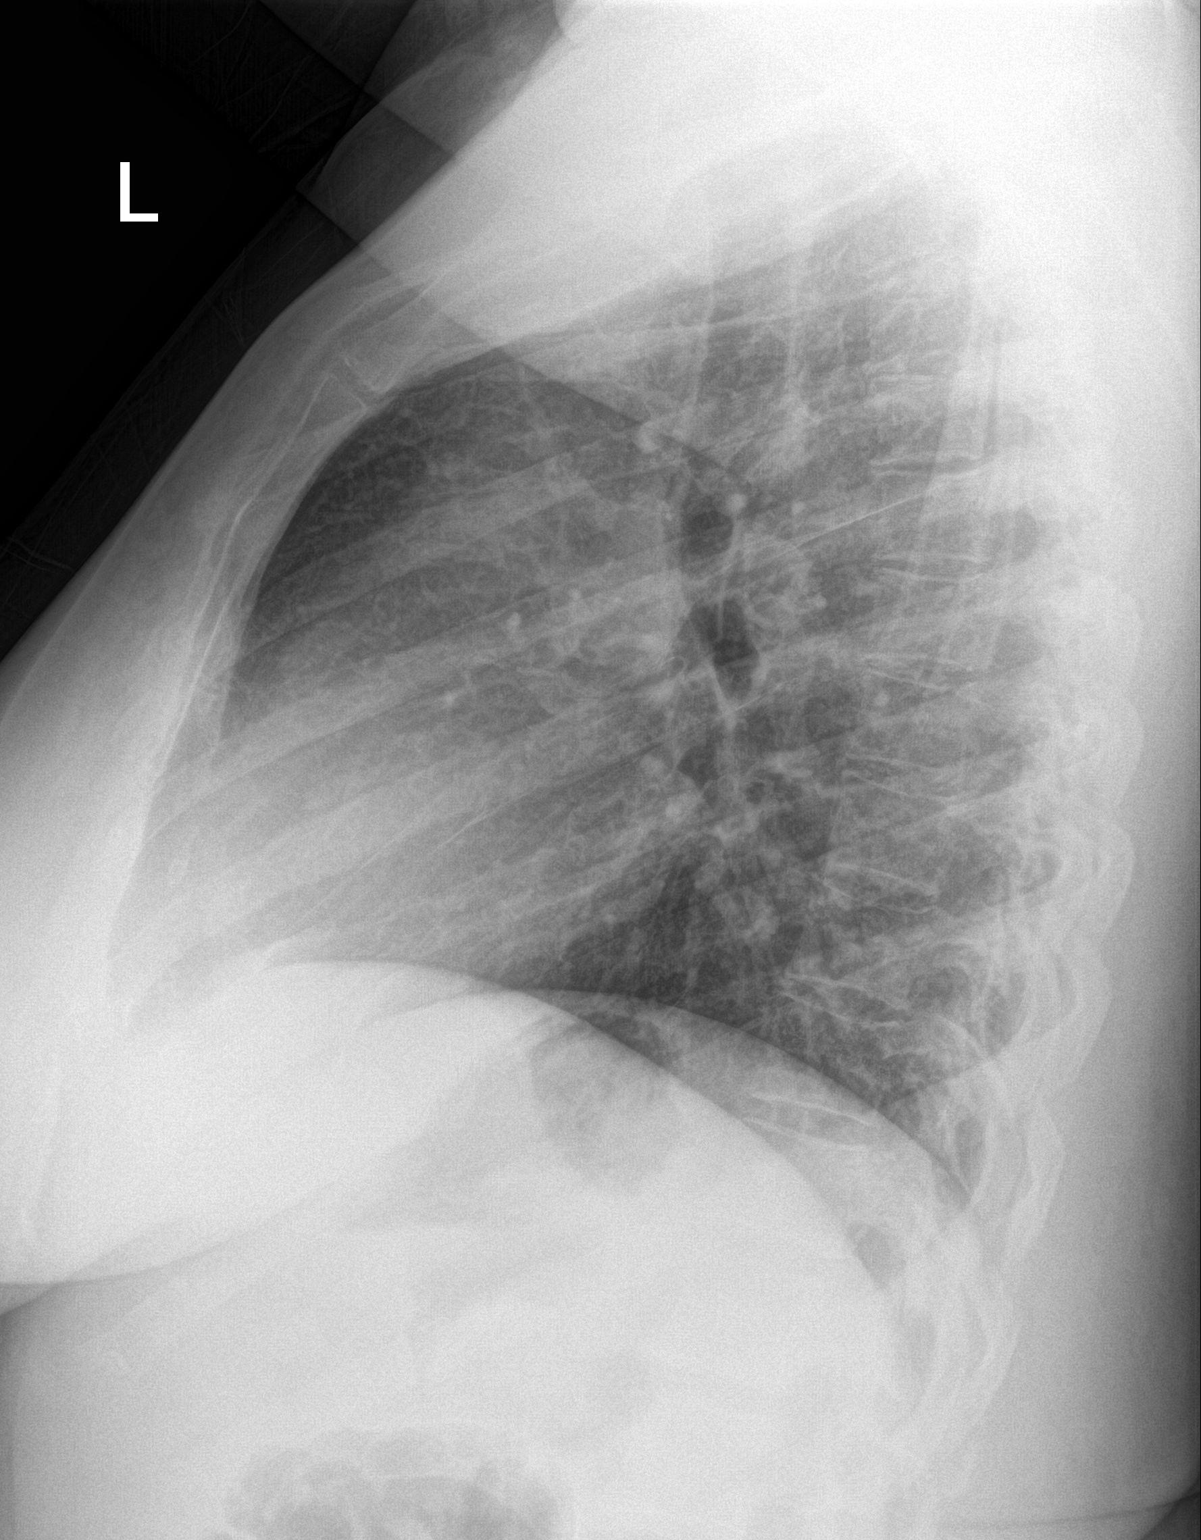

[2 of 2 positions shown; findings below may reference images not displayed]

FINDINGS: The heart size and mediastinal contours are within normal limits.
Both lungs are clear. The visualized skeletal structures are
unremarkable.
IMPRESSION: Negative.  No active cardiopulmonary disease.

## 2019-11-21 MED ORDER — OMEPRAZOLE 20 MG PO CPDR: 20.0000 mg | DELAYED_RELEASE_CAPSULE | Freq: Every day | ORAL | 0 refills | Status: DC

## 2019-11-21 MED ORDER — BENZONATATE 100 MG PO CAPS: 100.0000 mg | ORAL_CAPSULE | Freq: Three times a day (TID) | ORAL | 0 refills | Status: DC

## 2019-11-21 MED ORDER — FLUCONAZOLE 200 MG PO TABS: 200.0000 mg | ORAL_TABLET | Freq: Once | ORAL | 0 refills | Status: AC

## 2019-11-21 MED ORDER — CETIRIZINE HCL 10 MG PO TABS: 10.0000 mg | ORAL_TABLET | Freq: Every day | ORAL | 0 refills | Status: DC

## 2019-11-21 MED ORDER — CLOTRIMAZOLE 1 % EX CREA: 1.0000 "application " | TOPICAL_CREAM | Freq: Two times a day (BID) | CUTANEOUS | 1 refills | Status: DC

## 2019-11-21 NOTE — ED Provider Notes (Signed)
[place]    CSN: [id number] Arrival date & time: 11/21/19  1046      History   Chief Complaint Chief Complaint  Patient presents with  . Sore Throat  . Vaginal Itching    HPI Rebecca [name] is a 54 y.o. female.   Rebecca [name] presents with complaints of cough which is intermittent for the past 2-3 weeks now. Throat feels itchy. Cough can wake her up at night. Has been taking allergy medication which hasn't helped. Some days are worse than others. No shortness of breath . No chest pain . Has had similar in the past with allergies. Has had increased stress as her husband recently passed away. Has had similar in the past as well with a previous blood pressure medication. No fevers. Cough is rarely productive. No nasal drainage. No headache. No asthma. Doesn't smoke. Does get some leg swelling, wears compression socks which helps, this is not new. No chest pain.  Also with complaints of vaginal itching. States she has had what sounds to have been a female circumcision procedure to vulva, and has some issues with itching. Has had to take "a pill" for this in the past. No noticeable vaginal discharge. Has used cream in the past as well, ran out.     ROS per HPI, negative if not otherwise mentioned.       Past Medical History:  Diagnosis Date  . Arthritis   . Diabetes mellitus   . Hyperlipidemia     Patient Active Problem List   Diagnosis Date Noted  . Diabetes ([place]) 03/11/2017  . Muscle pain 08/06/2013  . Tiredness 08/06/2013  . Candida vaginitis 08/19/2012  . Dental cavities 08/19/2012  . Knee pain, bilateral 04/16/2012  . Diabetes mellitus type 2, controlled ([place]) 04/16/2012  . Hyperlipidemia 04/16/2012  . Palpitations 04/16/2012  . Back pain 04/16/2012    Past Surgical History:  Procedure Laterality Date  . CESAREAN SECTION      OB History   No obstetric history on file.      Home Medications    Prior to Admission medications     Medication Sig Start Date End Date Taking? Authorizing Provider  acetaminophen (TYLENOL) 500 MG tablet Take 1,000 mg by mouth daily as needed for mild pain. Reported on 01/16/2016    [provider]  amLODipine (NORVASC) 10 MG tablet Take 10 mg by mouth daily. 10/12/19   [provider]  atorvastatin (LIPITOR) 80 MG tablet Take 1 tablet (80 mg total) by mouth daily. 11/04/19   [name], MD  benzonatate (TESSALON) 100 MG capsule Take 1 capsule (100 mg total) by mouth every 8 (eight) hours. 11/21/19   Zigmund Gottron, NP  Blood Glucose Monitoring Suppl (CONTOUR NEXT MONITOR) w/Device KIT 1 Units by Does not apply route daily. Use as directed to test blood sugar up to three times daily. 12/30/18   [name], MD  cetirizine (ZYRTEC) 10 MG tablet Take 1 tablet (10 mg total) by mouth daily. 11/21/19   Zigmund Gottron, NP  clindamycin-benzoyl peroxide (BENZACLIN) gel Apply topically 2 (two) times daily. 06/12/18   [name], MD  clotrimazole (CLOTRIMAZOLE AF) 1 % cream Apply 1 application topically 2 (two) times daily. 11/21/19   Augusto Gamble B, NP  Dapsone 5 % topical gel APPLY ONE PEA SIZED AMOUNT TO FACE TWICE A DAY. 06/26/17   Alfonse Spruce, FNP  fluconazole (DIFLUCAN) 200 MG tablet Take 1 tablet (200 mg total) by mouth  once for 1 dose. 11/21/19 11/21/19  Zigmund Gottron, NP  glipiZIDE (GLUCOTROL) 5 MG tablet Take 1 tablet (5 mg total) by mouth daily before breakfast. 11/04/19   [name], MD  glucose blood (CONTOUR NEXT TEST) test strip Use as directed to test blood sugar up to three times daily. 12/30/18   [name], MD  ibuprofen (ADVIL,MOTRIN) 800 MG tablet Take 1 tablet (800 mg total) by mouth 2 (two) times daily as needed. 07/10/18   Argentina Donovan, PA-C  lidocaine (XYLOCAINE) 5 % ointment Apply 1 application topically daily as needed. Apply to affected areas. 09/25/17   Alfonse Spruce, FNP  metFORMIN (GLUCOPHAGE) 500 MG tablet Take 2 tablets  (1,000 mg total) by mouth 2 (two) times daily with a meal. 11/04/19   [name], MD  Microlet Lancets MISC Use as directed to test blood sugar up to three times daily. 12/30/18   [name], MD  naproxen (NAPROSYN) 500 MG tablet Take 1 tablet (500 mg total) by mouth 2 (two) times daily. 01/02/19   Loura Halt A, NP  omeprazole (PRILOSEC) 20 MG capsule Take 1 capsule (20 mg total) by mouth daily. 11/21/19   Zigmund Gottron, NP  propranolol (INDERAL) 10 MG tablet Take 1 tablet (10 mg total) by mouth 3 (three) times daily as needed. 07/29/19   Drenda Freeze, MD    Family History Family History  Problem Relation Age of Onset  . Diabetes Mother   . Heart disease Mother   . Diabetes Father   . Cancer Father 19       Throat  . Diabetes Sister   . Diabetes Brother     Social History Social History   Tobacco Use  . Smoking status: Passive Smoke Exposure - Never Smoker  . Smokeless tobacco: Never Used  Substance Use Topics  . Alcohol use: No  . Drug use: No     Allergies   Patient has no known allergies.   Review of Systems Review of Systems   Physical Exam Triage Vital Signs ED Triage Vitals  Enc Vitals Group     BP 11/21/19 1127 135/86     Pulse Rate 11/21/19 1127 88     Resp 11/21/19 1127 18     Temp 11/21/19 1127 98.2 F (36.8 C)     Temp Source 11/21/19 1127 Oral     SpO2 11/21/19 1127 100 %     Weight --      Height --      Head Circumference --      Peak Flow --      Pain Score 11/21/19 1119 0     Pain Loc --      Pain Edu? --      Excl. in [place]? --    No data found.  Updated Vital Signs BP 135/86 (BP Location: Right Arm)   Pulse 88   Temp 98.2 F (36.8 C) (Oral)   Resp 18   LMP 11/04/2019 (Approximate) Comment: 1 month  SpO2 100%   Physical Exam Constitutional:      General: She is not in acute distress.    Appearance: She is well-developed.  Cardiovascular:     Rate and Rhythm: Normal rate.  Pulmonary:     Effort: Pulmonary effort  is normal.     Breath sounds: Normal breath sounds.     Comments: Occasional strong dry cough noted.  Skin:    General: Skin is warm and dry.  Neurological:  Mental Status: She is alert and oriented to person, place, and time.      UC Treatments / Results  Labs (all labs ordered are listed, but only abnormal results are displayed) Labs Reviewed  CULTURE, GROUP A STREP [facility])  POCT RAPID STREP A  CERVICOVAGINAL ANCILLARY ONLY    EKG   Radiology DG Chest 2 View  Result Date: 11/21/2019 CLINICAL DATA:  Cough for 3 weeks. Tachycardia. Hypertension. Diabetes. EXAM: CHEST - 2 VIEW COMPARISON:  07/29/2019 FINDINGS: The heart size and mediastinal contours are within normal limits. Both lungs are clear. The visualized skeletal structures are unremarkable. IMPRESSION: Negative.  No active cardiopulmonary disease. Electronically Signed   By: Marlaine Hind M.D.   On: 11/21/2019 13:03    Procedures Procedures (including critical care time)  Medications Ordered in UC Medications - No data to display  Initial Impression / Assessment and Plan / UC Course  I have reviewed the triage vital signs and the nursing notes.  Pertinent labs & imaging results that were available during my care of the patient were reviewed by me and considered in my medical decision making (see chart for details).     2-3 weeks of cough. covid testing deferred at this time. Chest xray without acute findings. Some cough during exam, no shortness of breath. No increased work of breathing, hypoxia or tachycardia. No chest pain . Tessalon, omeprazole, zyrtec initiated. Viral or post viral vs allergic vs reflux contributing to cough considered.  Diflucan and clotrimazole provided pending confirmation with self swab vaginal cytology, suspect vulvovaginal yeast. Return precautions provided. If symptoms worsen or do not improve in the next week to return to be seen or to follow up with PCP.  Patient verbalized understanding  and agreeable to plan.   Final Clinical Impressions(s) / UC Diagnoses   Final diagnoses:  Cough  Vaginal itching     Discharge Instructions     I have sent cream to use to the vagina for itching, as well as 1 tablet to treat any yeast infection. This will be confirmed with the swab we collected here today, we will call you if any changes to treatment are indicated.  Tessalon as needed for cough.  Reflux can sometimes trigger a cough so I do recommend daily omeprazole to see if this is helpful.  Daily allergy medication, or zyrtec to help with any post nasal drip.  Please follow up with your primary care provider for recheck if your symptoms persist.     ED Prescriptions    Medication Sig Dispense Auth. Provider   clotrimazole (CLOTRIMAZOLE AF) 1 % cream Apply 1 application topically 2 (two) times daily. 30 g Augusto Gamble B, NP   fluconazole (DIFLUCAN) 200 MG tablet Take 1 tablet (200 mg total) by mouth once for 1 dose. 1 tablet Augusto Gamble B, NP   benzonatate (TESSALON) 100 MG capsule Take 1 capsule (100 mg total) by mouth every 8 (eight) hours. 21 capsule Augusto Gamble B, NP   omeprazole (PRILOSEC) 20 MG capsule Take 1 capsule (20 mg total) by mouth daily. 30 capsule Augusto Gamble B, NP   cetirizine (ZYRTEC) 10 MG tablet Take 1 tablet (10 mg total) by mouth daily. 30 tablet Zigmund Gottron, NP     PDMP not reviewed this encounter.   Zigmund Gottron, NP 11/21/19 1443

## 2019-11-21 NOTE — Discharge Instructions (Addendum)
I have sent cream to use to the vagina for itching, as well as 1 tablet to treat any yeast infection. This will be confirmed with the swab we collected here today, we will call you if any changes to treatment are indicated.  Tessalon as needed for cough.  Reflux can sometimes trigger a cough so I do recommend daily omeprazole to see if this is helpful.  Daily allergy medication, or zyrtec to help with any post nasal drip.  Please follow up with your primary care provider for recheck if your symptoms persist.

## 2019-11-21 NOTE — ED Triage Notes (Addendum)
Pt reports she is having scratchy throat and dry cough x 2 weeks aprox. Pt states this may be related to allergies. Pt reports she is having intermittent vaginal itching x 1 month.   Pt reports she is having a lot of stress, as her husband died in 2023-11-01.

## 2019-11-23 LAB — CULTURE, GROUP A STREP (THRC)

## 2019-11-24 LAB — CERVICOVAGINAL ANCILLARY ONLY
Bacterial vaginitis: NEGATIVE
Candida vaginitis: POSITIVE — AB
Chlamydia: NEGATIVE
Neisseria Gonorrhea: NEGATIVE
Trichomonas: NEGATIVE

## 2019-12-07 ENCOUNTER — Encounter: Payer: Self-pay | Admitting: Family Medicine

## 2019-12-26 ENCOUNTER — Ambulatory Visit (HOSPITAL_COMMUNITY)
Admission: EM | Admit: 2019-12-26 | Discharge: 2019-12-26 | Disposition: A | Discharge status: Home / Self Care | Payer: 59 | Attending: Family Medicine | Admitting: Family Medicine

## 2019-12-26 ENCOUNTER — Other Ambulatory Visit: Payer: Self-pay

## 2019-12-26 ENCOUNTER — Encounter (HOSPITAL_COMMUNITY): Payer: Self-pay

## 2019-12-26 ENCOUNTER — Ambulatory Visit (INDEPENDENT_AMBULATORY_CARE_PROVIDER_SITE_OTHER): Payer: 59

## 2019-12-26 DIAGNOSIS — M79671 Pain in right foot: Secondary | ICD-10-CM

## 2019-12-26 DIAGNOSIS — M79672 Pain in left foot: Secondary | ICD-10-CM

## 2019-12-26 DIAGNOSIS — M2012 Hallux valgus (acquired), left foot: Secondary | ICD-10-CM

## 2019-12-26 DIAGNOSIS — M7751 Other enthesopathy of right foot: Secondary | ICD-10-CM

## 2019-12-26 IMAGING — DX DG FOOT COMPLETE 3+V*L*
3 series · 3 of 3 positions shown · non-contrast
Comparison: Left foot radiographs [DATE]

CLINICAL DATA: Pain and swelling in bilateral legs with difficulty
walking x >1 month. MUI

EXAM:
LEFT FOOT - COMPLETE 3+ VIEW

[foot ap]
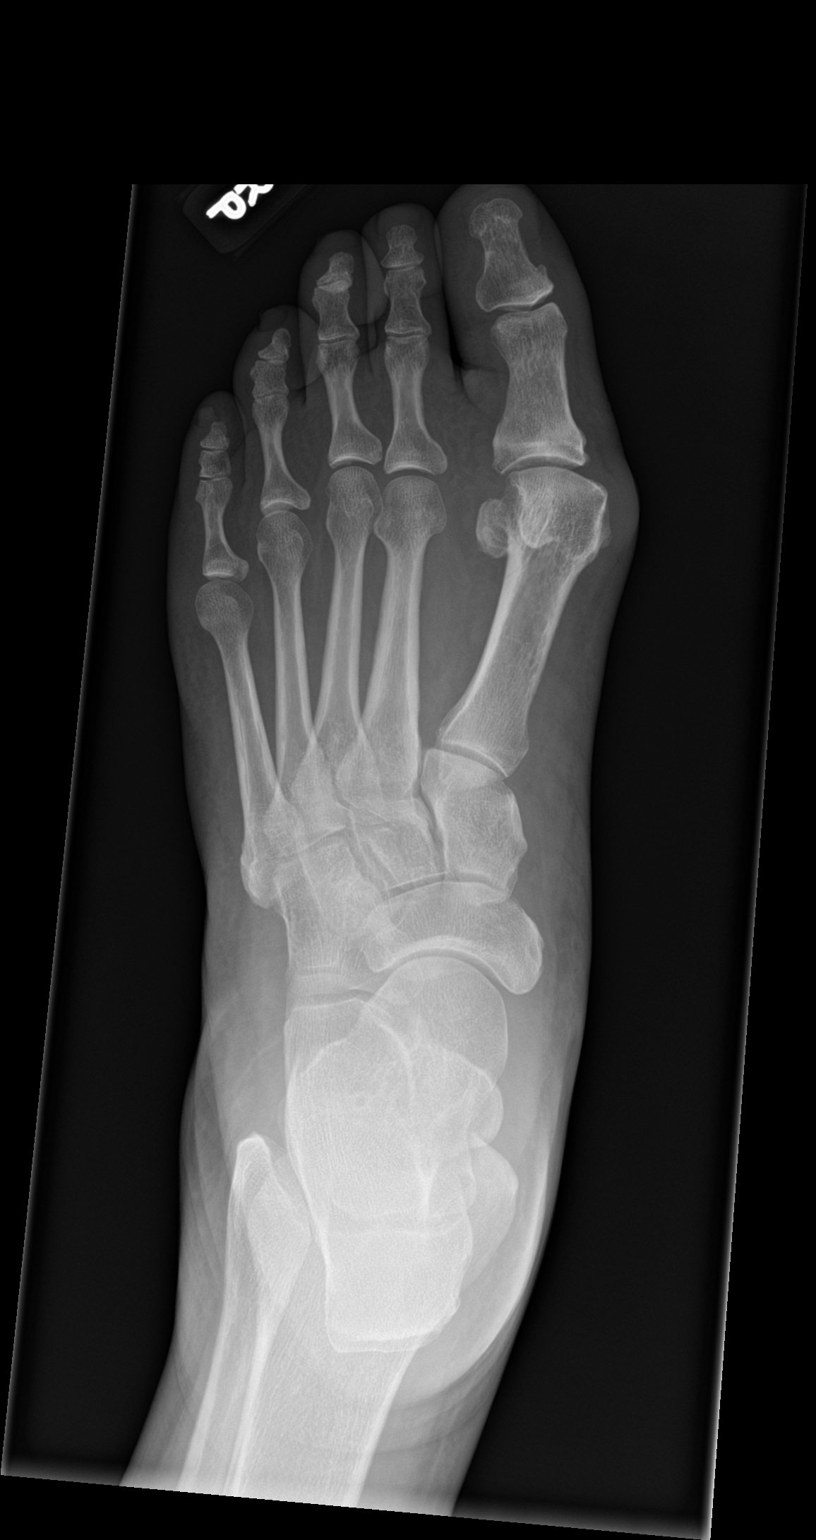

[foot obl]
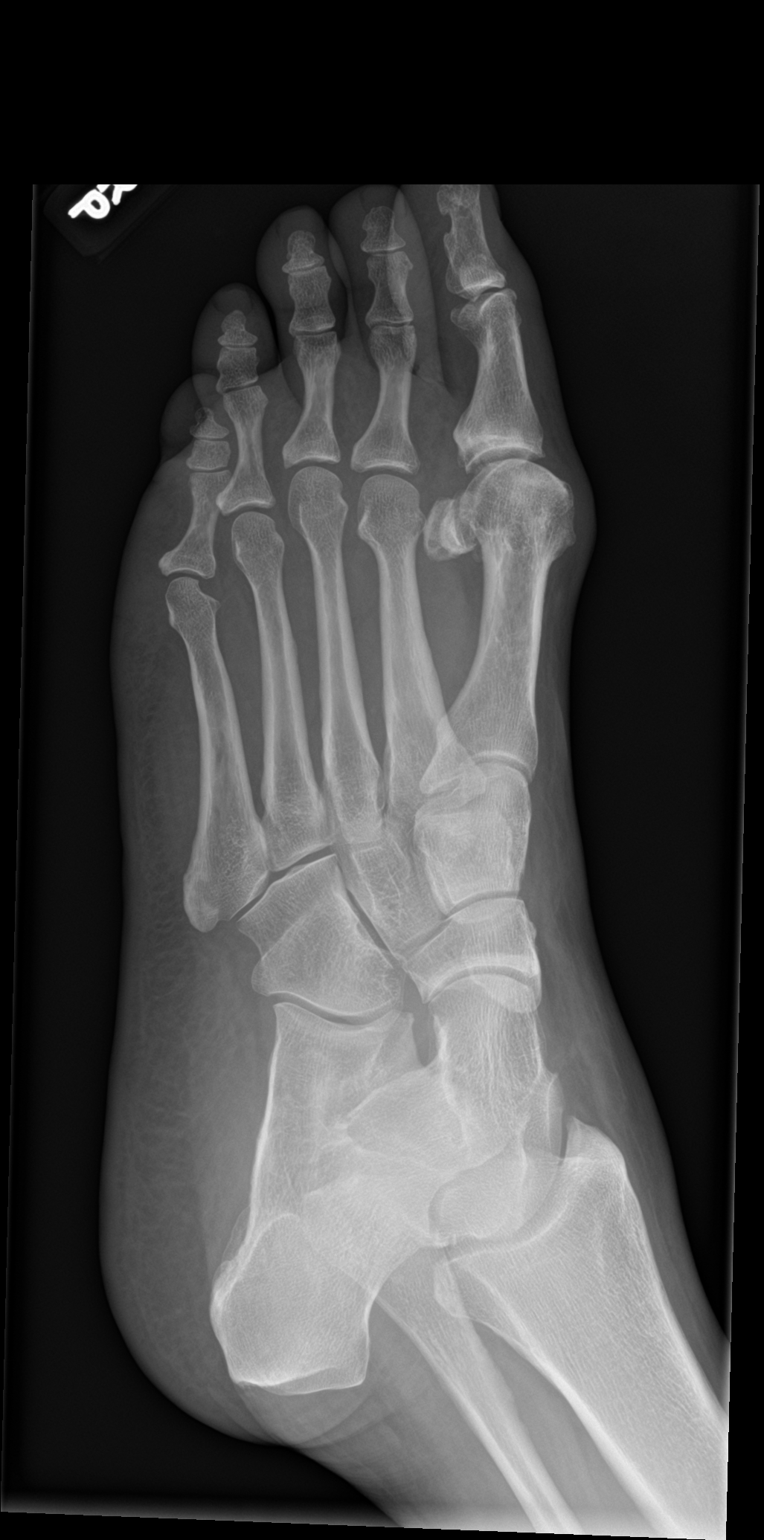

[foot lat]
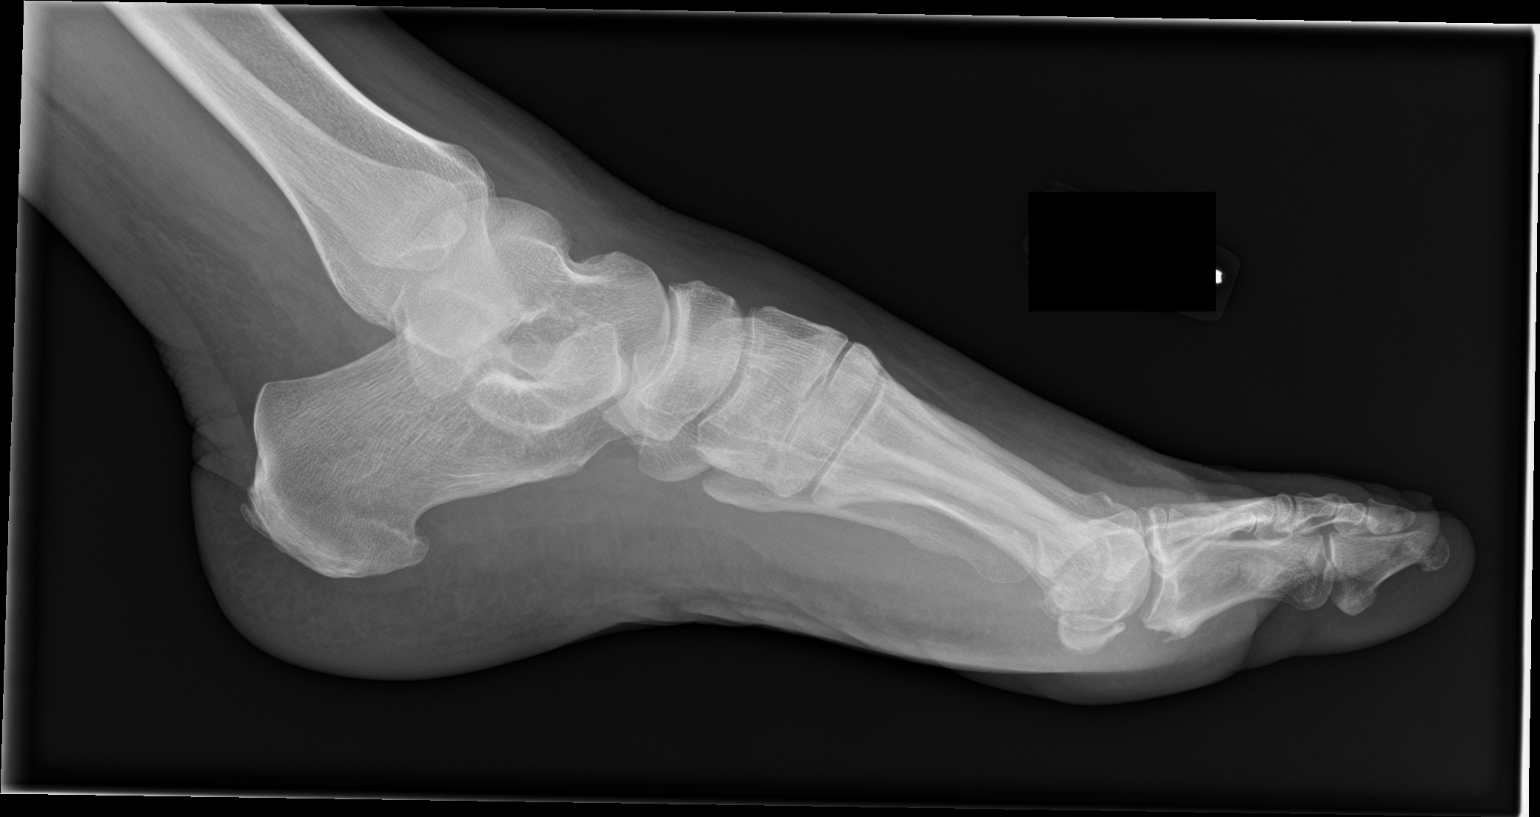

[3 of 3 positions shown; findings below may reference images not displayed]

FINDINGS: There is no evidence of fracture or dislocation. Hallux valgus
deformity of the first MTP joint with mild to moderate degenerative
changes. No focal bone lesion. Soft tissues are unremarkable.
IMPRESSION: No acute finding in the left foot.  Hallux valgus.

## 2019-12-26 IMAGING — DX DG FOOT COMPLETE 3+V*R*
3 series · 3 of 3 positions shown · non-contrast
Comparison: [DATE]

CLINICAL DATA: Foot and ankle pain with edema.

EXAM:
RIGHT FOOT COMPLETE - 3+ VIEW

[foot ap]
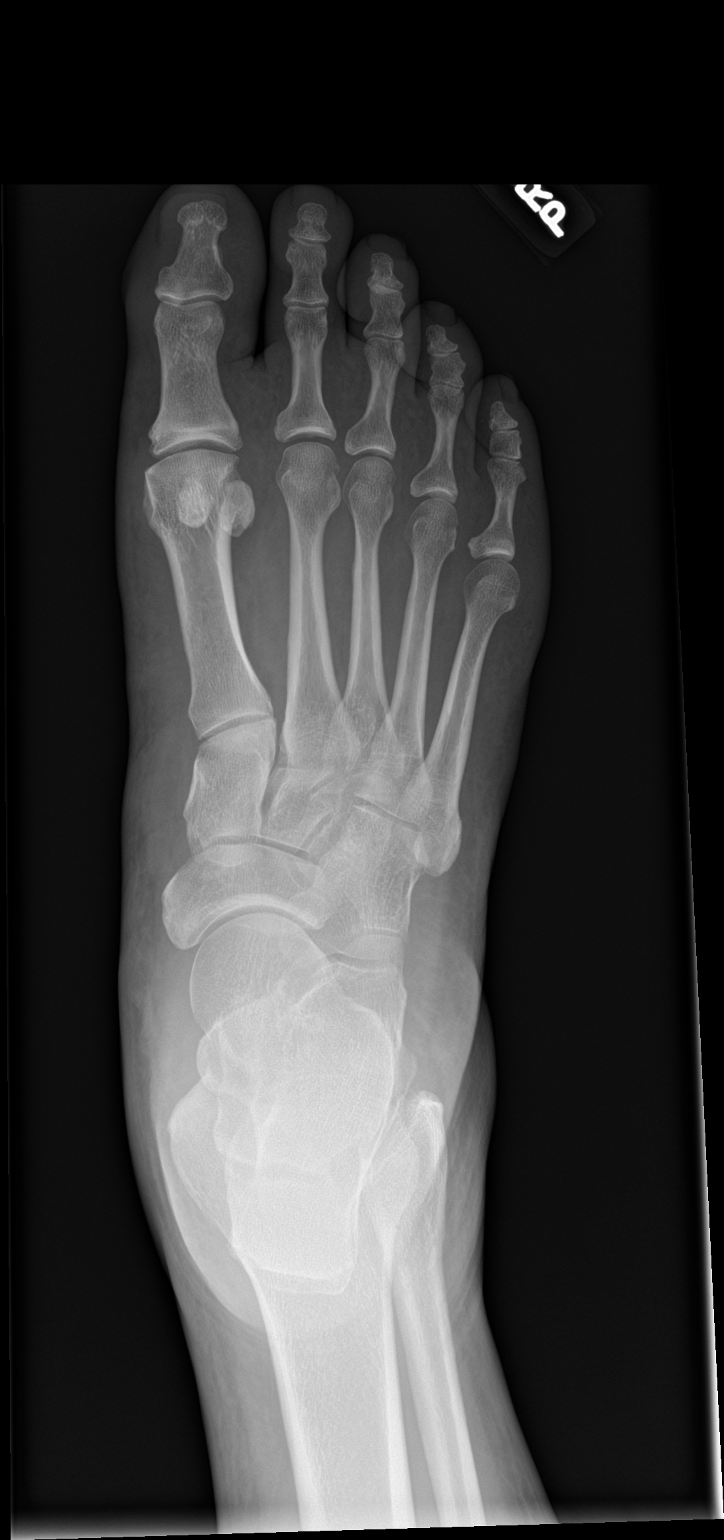

[foot obl]
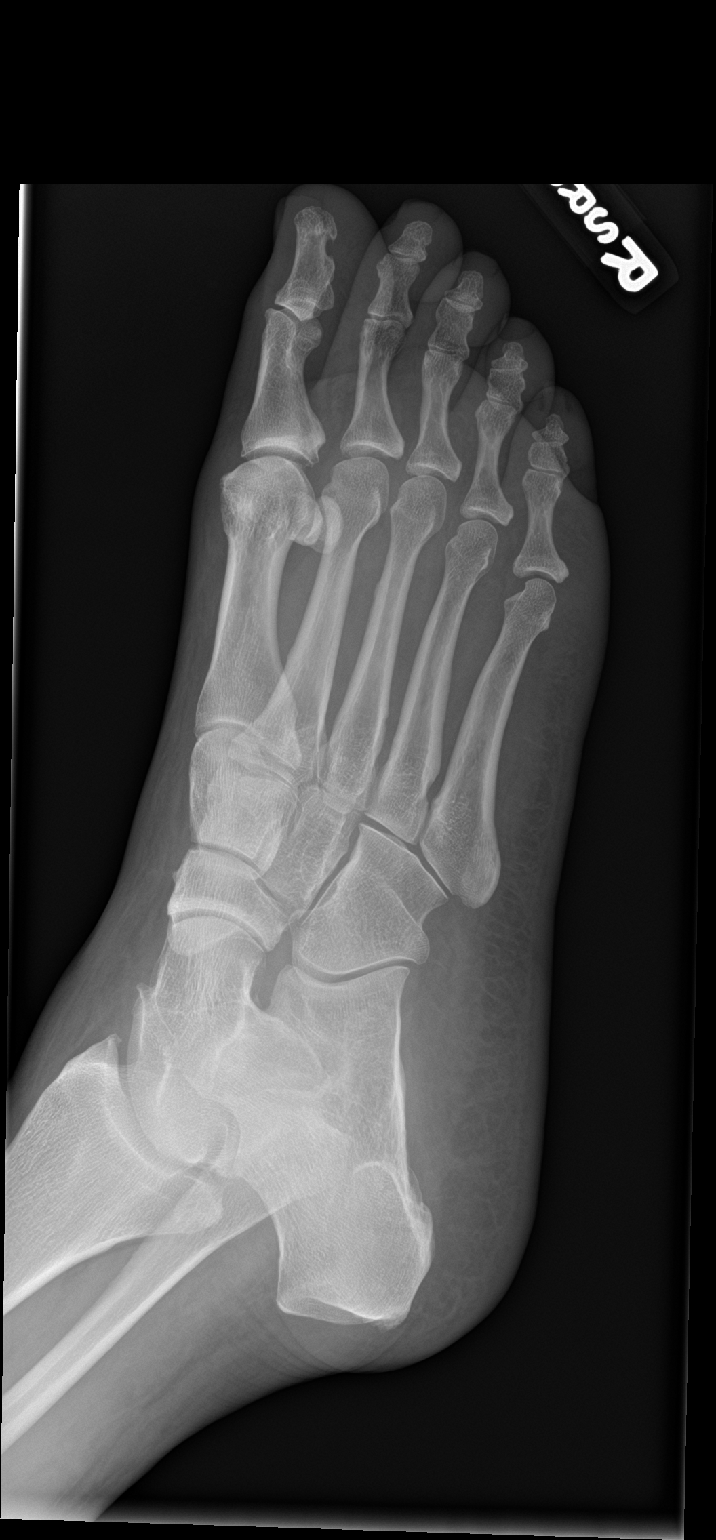

[foot lat]
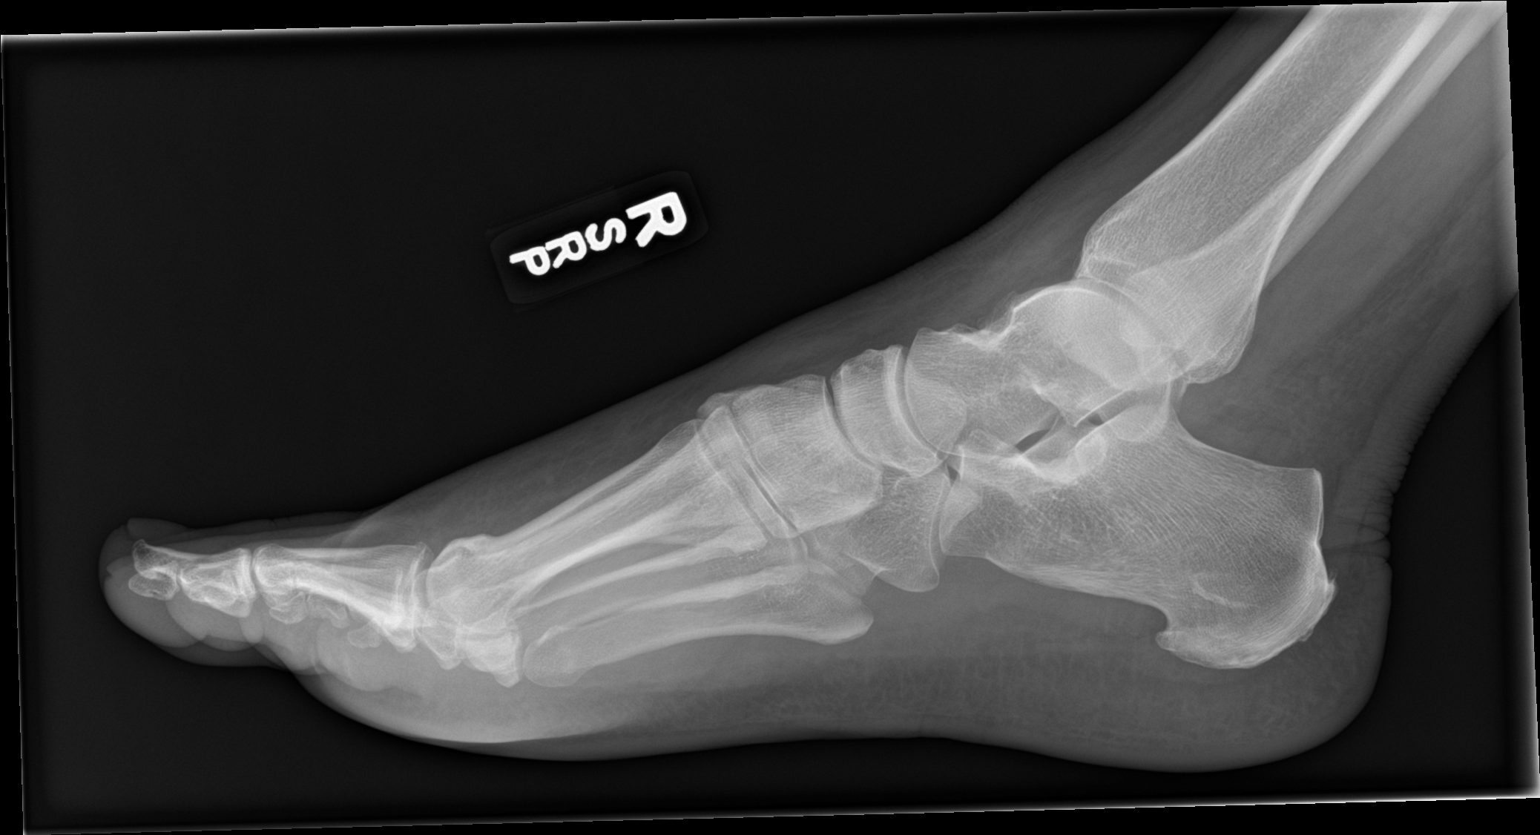

[3 of 3 positions shown; findings below may reference images not displayed]

FINDINGS: Negative for fracture or dislocation. Alignment of the right foot is
normal. There is a plantar calcaneal spur. No focal soft tissue
abnormality. Mild degenerative changes at the first MTP joint.
IMPRESSION: 1. No acute bone abnormality.
2. Calcaneal spur.

## 2019-12-26 MED ORDER — NAPROXEN 500 MG PO TABS: 500.0000 mg | ORAL_TABLET | Freq: Two times a day (BID) | ORAL | 1 refills | Status: DC

## 2019-12-26 MED ORDER — CYCLOBENZAPRINE HCL 10 MG PO TABS: 10.0000 mg | ORAL_TABLET | Freq: Every day | ORAL | 1 refills | Status: DC

## 2019-12-26 NOTE — ED Triage Notes (Signed)
Pt present pain in both legs, symptoms started over a month ago. Pt is having pain and swelling in both legs with difficulty walking.

## 2019-12-26 NOTE — Discharge Instructions (Addendum)
For foot swelling I recommend wearing compression socks that she can purchase over-the-counter at any retail store.  Compression socks will decrease pain and swelling of her ankles and feet.  Also recommend purchasing over-the-counter gel inserts to place in your work boots as this will also help improve the pain that is occurring in your feet and ankles.  I am placing the information for you to follow back up with [facility] for evaluation of your foot pain and other musculoskeletal pain that they were treating you for previously.  You can contact their office on Monday to see if they are excepting your insurance.  I have included their contact information on your paperwork.  I have refilled your muscle relaxer and also your anti-inflammatory medicine naproxen 500 mg twice daily.  Take medication as directed.

## 2019-12-26 NOTE — ED Provider Notes (Signed)
[place]    CSN: [id number] Arrival date & time: 12/26/19  1240      History   Chief Complaint Chief Complaint  Patient presents with  . Leg Pain    Both   Arabic interpreter service used to facilitate communication during visit.  HPI Rebecca [name] is a 54 y.o. female.   HPI  Patient presents today with a concern of bilateral foot swelling and pain. Patient has had this issue occur in the past approximately 2 years ago and was advised that her right foot pain was related to her chronic bunion and left foot pain was related to a bone spur.  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  Current symptoms have been present for over a month has gradually worsened to the point she is having difficulty walking.  She endorses some bilateral lower leg pain.  Has had issues with knee pain previously however no knee pain is accompanying symptoms at present.  She has taken a muscle relaxer which resolved pain symptoms however she is out of medication.  She is also taken naproxen in the past and found relief with the inflammatory.  She denies any weakness of her legs and swelling improves with elevation however returns immediately with prolonged standing.  Swelling is mostly occurring in both of her ankles and the top portion of her feet..  She recently purchased new safety boots thinking that was the issue however foot pain has not improved. Past Medical History:  Diagnosis Date  . Arthritis   . Diabetes mellitus   . Hyperlipidemia     Patient Active Problem List   Diagnosis Date Noted  . Diabetes ([place]) 03/11/2017  . Muscle pain 08/06/2013  . Tiredness 08/06/2013  . Candida vaginitis 08/19/2012  . Dental cavities 08/19/2012  . Knee pain, bilateral 04/16/2012  . Diabetes mellitus type 2, controlled ([place]) 04/16/2012  . Hyperlipidemia 04/16/2012  . Palpitations 04/16/2012  . Back pain 04/16/2012    Past Surgical  History:  Procedure Laterality Date  . CESAREAN SECTION      OB History   No obstetric history on file.      Home Medications    Prior to Admission medications   Medication Sig Start Date End Date Taking? Authorizing Provider  acetaminophen (TYLENOL) 500 MG tablet Take 1,000 mg by mouth daily as needed for mild pain. Reported on 01/16/2016    [provider]  amLODipine (NORVASC) 10 MG tablet Take 10 mg by mouth daily. 10/12/19   [provider]  atorvastatin (LIPITOR) 80 MG tablet Take 1 tablet (80 mg total) by mouth daily. 11/04/19   [name], MD  benzonatate (TESSALON) 100 MG capsule Take 1 capsule (100 mg total) by mouth every 8 (eight) hours. 11/21/19   Zigmund Gottron, NP  Blood Glucose Monitoring Suppl (CONTOUR NEXT MONITOR) w/Device KIT 1 Units by Does not apply route daily. Use as directed to test blood sugar up to three times daily. 12/30/18   [name], MD  cetirizine (ZYRTEC) 10 MG tablet Take 1 tablet (10 mg total) by mouth daily. 11/21/19   Zigmund Gottron, NP  clindamycin-benzoyl peroxide (BENZACLIN) gel Apply topically 2 (two) times daily. 06/12/18   [name], MD  clotrimazole (CLOTRIMAZOLE AF) 1 % cream Apply 1 application topically 2 (two) times daily. 11/21/19   Augusto Gamble B, NP  Dapsone 5 % topical gel APPLY ONE PEA SIZED AMOUNT TO FACE  TWICE A DAY. 06/26/17   Alfonse Spruce, FNP  glipiZIDE (GLUCOTROL) 5 MG tablet Take 1 tablet (5 mg total) by mouth daily before breakfast. 11/04/19   [name], MD  glucose blood (CONTOUR NEXT TEST) test strip Use as directed to test blood sugar up to three times daily. 12/30/18   [name], MD  ibuprofen (ADVIL,MOTRIN) 800 MG tablet Take 1 tablet (800 mg total) by mouth 2 (two) times daily as needed. 07/10/18   Argentina Donovan, PA-C  lidocaine (XYLOCAINE) 5 % ointment Apply 1 application topically daily as needed. Apply to affected areas. 09/25/17   Alfonse Spruce, FNP  metFORMIN  (GLUCOPHAGE) 500 MG tablet Take 2 tablets (1,000 mg total) by mouth 2 (two) times daily with a meal. 11/04/19   [name], MD  Microlet Lancets MISC Use as directed to test blood sugar up to three times daily. 12/30/18   [name], MD  naproxen (NAPROSYN) 500 MG tablet Take 1 tablet (500 mg total) by mouth 2 (two) times daily. 01/02/19   Loura Halt A, NP  omeprazole (PRILOSEC) 20 MG capsule Take 1 capsule (20 mg total) by mouth daily. 11/21/19   Zigmund Gottron, NP  propranolol (INDERAL) 10 MG tablet Take 1 tablet (10 mg total) by mouth 3 (three) times daily as needed. 07/29/19   Drenda Freeze, MD    Family History Family History  Problem Relation Age of Onset  . Diabetes Mother   . Heart disease Mother   . Diabetes Father   . Cancer Father 64       Throat  . Diabetes Sister   . Diabetes Brother     Social History Social History   Tobacco Use  . Smoking status: Passive Smoke Exposure - Never Smoker  . Smokeless tobacco: Never Used  Substance Use Topics  . Alcohol use: No  . Drug use: No     Allergies   Patient has no known allergies.   Review of Systems Review of Systems Pertinent negatives listed in HPI Physical Exam Triage Vital Signs ED Triage Vitals  Enc Vitals Group     BP 12/26/19 1313 131/85     Pulse Rate 12/26/19 1313 (!) 101     Resp 12/26/19 1313 16     Temp 12/26/19 1313 98.3 F (36.8 C)     Temp Source 12/26/19 1313 Oral     SpO2 12/26/19 1313 99 %     Weight --      Height --      Head Circumference --      Peak Flow --      Pain Score 12/26/19 1312 10     Pain Loc --      Pain Edu? --      Excl. in [address]? --    No data found.  Updated Vital Signs BP 131/85 (BP Location: Right Arm)   Pulse (!) 101   Temp 98.3 F (36.8 C) (Oral)   Resp 16   SpO2 99%   Visual Acuity Right Eye Distance:   Left Eye Distance:   Bilateral Distance:    Right Eye Near:   Left Eye Near:    Bilateral Near:     Physical Exam Constitutional:        Appearance: Normal appearance.  HENT:     Head: Normocephalic.  Cardiovascular:     Rate and Rhythm: Normal rate and regular rhythm.     Heart sounds: Normal heart sounds.  Pulmonary:  Effort: Pulmonary effort is normal.     Breath sounds: Normal breath sounds.  Musculoskeletal:     Cervical back: Normal range of motion.     Right ankle: Swelling present.     Left ankle: Swelling present.     Right foot: Swelling and tenderness present. Normal pulse.     Left foot: Bunion and tenderness present. Normal pulse.  Skin:    General: Skin is warm.  Neurological:     Mental Status: She is alert.      UC Treatments / Results  Labs (all labs ordered are listed, but only abnormal results are displayed) Labs Reviewed - No data to display  EKG   Radiology No results found.  Procedures Procedures (including critical care time)  Medications Ordered in UC Medications - No data to display  Initial Impression / Assessment and Plan / UC Course  I have reviewed the triage vital signs and the nursing notes.  Pertinent labs & imaging results that were available during my care of the patient were reviewed by me and considered in my medical decision making (see chart for details).        Final Clinical Impressions(s) / UC Diagnoses   Final diagnoses:  Foot pain, bilateral  Bone spur of right foot  Hallux valgus of left foot   Patient previously followed by [facility] for this issue in the past and request to go back to their practice as long as 6 after new insurance.  Information provided to contact their office directly during normal business hours on Monday to schedule an appointment.  Today we will manage symptoms of pain and refill cyclobenzaprine 10 mg to take at bedtime and prescribed naproxen 500 mg twice daily for inflammation and pain.  Encouraged to purchase over-the-counter compression socks as this will help with swelling that occurs mostly with prolonged  ambulation.  Also encouraged to purchase over-the-counter foot gel inserts as imaging did reveal a right foot spur which has been present since her imaging back in 2018.  Patient voiced understanding and agreement with plan.      Discharge Instructions     For foot swelling I recommend wearing compression socks that she can purchase over-the-counter at any retail store.  Compression socks will decrease pain and swelling of her ankles and feet.  Also recommend purchasing over-the-counter gel inserts to place in your work boots as this will also help improve the pain that is occurring in your feet and ankles.  I am placing the information for you to follow back up with [facility] for evaluation of your foot pain and other musculoskeletal pain that they were treating you for previously.  You can contact their office on Monday to see if they are excepting your insurance.  I have included their contact information on your paperwork.  I have refilled your muscle relaxer and also your anti-inflammatory medicine naproxen 500 mg twice daily.  Take medication as directed.    ED Prescriptions    Medication Sig Dispense Auth. Provider   naproxen (NAPROSYN) 500 MG tablet Take 1 tablet (500 mg total) by mouth 2 (two) times daily with a meal. 30 tablet Scot Jun, FNP   cyclobenzaprine (FLEXERIL) 10 MG tablet Take 1 tablet (10 mg total) by mouth at bedtime. 30 tablet Scot Jun, FNP     PDMP not reviewed this encounter.   Scot Jun, FNP 12/26/19 [phone]

## 2019-12-29 ENCOUNTER — Ambulatory Visit (INDEPENDENT_AMBULATORY_CARE_PROVIDER_SITE_OTHER): Payer: 59 | Admitting: Physician Assistant

## 2019-12-29 ENCOUNTER — Other Ambulatory Visit: Payer: Self-pay

## 2019-12-29 ENCOUNTER — Encounter: Payer: Self-pay | Admitting: Family

## 2019-12-29 VITALS — Ht 65.0 in | Wt 171.0 lb

## 2019-12-29 DIAGNOSIS — M2012 Hallux valgus (acquired), left foot: Secondary | ICD-10-CM | POA: Diagnosis not present

## 2019-12-29 DIAGNOSIS — M76821 Posterior tibial tendinitis, right leg: Secondary | ICD-10-CM | POA: Diagnosis not present

## 2019-12-29 NOTE — Progress Notes (Signed)
Office Visit Note   Patient: Rebecca [name]           Date of Birth: July 03, 1966           MRN: [id number] Visit Date: 12/29/2019              Requested by: [name], MD [place],  [place] [address] PCP: [name], MD  Chief Complaint  Patient presents with  . Right Foot - Pain      HPI: This is a pleasant 54 year old woman who is accompanied by an interpreter she presents with a 16-month history of right medial ankle pain and swelling.  She is also concerned about a deformity over the medial side of her left foot.  She had been seen and evaluated in the emergency room for these issues.  She was diagnosed with a heel spur as well as a bunion.  She has been on naproxen for this.  Assessment & Plan: Visit Diagnoses: No diagnosis found.  Plan: On the right side findings most consistent with posterior tibial tendinitis.  She has very unsupportive pointy flat shoes with no arch support.  On the right side I will try her in a posterior tibial tendon brace for 4 weeks.  She also has a work shoe that she must wear and she thinks this is when her problems began.  I gave her a prescription for sole orthotics.  I also discussed with her the importance of obtaining a more supportive shoe with a wider toe box.  I believe new balance would be a good option for her.  As she is leaving she is also discussing a history of scoliosis on her back.  If she returns in 4 weeks and this is still a problem if she does not have current x-rays we should obtain radiographs  Follow-Up Instructions: No follow-ups on file.   Ortho Exam  Patient is alert, oriented, no adenopathy, well-dressed, normal affect, normal respiratory effort. Focused examination of her right ankle demonstrates mild soft tissue swelling no cellulitis focused around the ankle.  She is tender just inferior to the medial malleolus along the posterior tibial tendon.  This pain is accentuated with resisted  internal rotation and she cannot do a toe raise.  No lateral impingement findings.  On her left side she has a prominent hallux valgus deformity.  There is no associated skin issues no swelling she has bilateral strong palpable pulses  Imaging: No results found. No images are attached to the encounter.  Labs: Lab Results  Component Value Date   HGBA1C 7.9 (A) 11/04/2019   HGBA1C 6.8 06/12/2018   HGBA1C 8.4 02/27/2018   REPTSTATUS 11/23/2019 FINAL 11/21/2019   CULT  11/21/2019    NO GROUP A STREP (S.PYOGENES) ISOLATED Performed at [facility], [place] [address] [place], [place] [address]      Lab Results  Component Value Date   ALBUMIN 4.5 11/04/2019   ALBUMIN 4.2 07/29/2019   ALBUMIN 4.3 07/12/2019    No results found for: MG No results found for: VD25OH  No results found for: PREALBUMIN CBC EXTENDED Latest Ref Rng & Units 07/29/2019 07/12/2019 01/16/2016  WBC 4.0 - 10.5 K/uL 7.2 8.9 7.2  RBC 3.87 - 5.11 MIL/uL 4.71 5.18(H) 4.98  HGB 12.0 - 15.0 g/dL 12.8 13.6 13.0  HCT 36.0 - 46.0 % 39.2 42.5 39.6  PLT 150 - 400 K/uL 304 349 318  NEUTROABS 1.7 - 7.7 K/uL 3.6 5.3 4.0  LYMPHSABS 0.7 - 4.0 K/uL 3.0 2.8 2.7     Body mass index is 28.46 kg/m.  Orders:  No orders of the defined types were placed in this encounter.  No orders of the defined types were placed in this encounter.    Procedures: No procedures performed  Clinical Data: No additional findings.  ROS:  All other systems negative, except as noted in the HPI. Review of Systems  Objective: Vital Signs: Ht 5\' 5"  (1.651 m)   Wt 171 lb (77.6 kg)   BMI 28.46 kg/m   Specialty Comments:  No specialty comments available.  PMFS History: Patient Active Problem List   Diagnosis Date Noted  . Diabetes ([place]) 03/11/2017  . Muscle pain 08/06/2013  . Tiredness 08/06/2013  . Candida vaginitis 08/19/2012  . Dental cavities 08/19/2012  . Knee pain, bilateral 04/16/2012  . Diabetes mellitus type 2,  controlled ([place]) 04/16/2012  . Hyperlipidemia 04/16/2012  . Palpitations 04/16/2012  . Back pain 04/16/2012   Past Medical History:  Diagnosis Date  . Arthritis   . Diabetes mellitus   . Hyperlipidemia     Family History  Problem Relation Age of Onset  . Diabetes Mother   . Heart disease Mother   . Diabetes Father   . Cancer Father 17       Throat  . Diabetes Sister   . Diabetes Brother     Past Surgical History:  Procedure Laterality Date  . CESAREAN SECTION     Social History   Occupational History  . Not on file  Tobacco Use  . Smoking status: Passive Smoke Exposure - Never Smoker  . Smokeless tobacco: Never Used  Substance and Sexual Activity  . Alcohol use: No  . Drug use: No  . Sexual activity: Yes

## 2020-01-20 ENCOUNTER — Other Ambulatory Visit: Payer: Self-pay | Admitting: Family Medicine

## 2020-01-26 ENCOUNTER — Ambulatory Visit (INDEPENDENT_AMBULATORY_CARE_PROVIDER_SITE_OTHER): Payer: 59 | Admitting: Family

## 2020-01-26 ENCOUNTER — Other Ambulatory Visit: Payer: Self-pay

## 2020-01-26 ENCOUNTER — Encounter: Payer: Self-pay | Admitting: Family

## 2020-01-26 VITALS — Ht 65.0 in | Wt 171.0 lb

## 2020-01-26 DIAGNOSIS — M549 Dorsalgia, unspecified: Secondary | ICD-10-CM | POA: Diagnosis not present

## 2020-01-26 DIAGNOSIS — M76821 Posterior tibial tendinitis, right leg: Secondary | ICD-10-CM | POA: Diagnosis not present

## 2020-01-26 DIAGNOSIS — M25511 Pain in right shoulder: Secondary | ICD-10-CM

## 2020-01-26 DIAGNOSIS — M76822 Posterior tibial tendinitis, left leg: Secondary | ICD-10-CM

## 2020-01-26 NOTE — Progress Notes (Signed)
Office Visit Note   Patient: Rebecca [name]           Date of Birth: [birth year]           MRN: [id number] Visit Date: 01/26/2020              Requested by: [name], MD [place],  [place] [address] PCP: [name], MD  Chief Complaint  Patient presents with  . Right Foot - Follow-up  . Lower Back - Pain      HPI: This is a pleasant 54 year old woman who is accompanied by an interpreter presents today in follow up for a 4 month history of medial sided foot pain on the right.  Has been in a posterior tibial tendon brace for posterior tibial tendon-itis on the right.  States that she previously had been employed working on a [employer] and had to stand for prolonged hours currently has a new job where she can alter her sitting and standing she has been having good relief of pain when she is wearing the brace however when she walks barefoot she does have return of her symptoms.  She has a obtained the step orthotics as well as new via walking shoes feels relief of her symptoms using the is not complete but does have some relief.  She does relate that she has been having the same symptoms of her left foot is requesting a posterior tibial tendinitis brace for the left foot.  Having medial sided pain shooting worse with standing and prolonged standing and walking aching ankle and foot pain   She has been on naproxen prn for this with moderate relief. States has been taking a muscle relaxer that has been helpful as well.   Separately she is complaining of some upper back pain pointing to the paraspinal muscles on the right.  Having some associated shoulder pain no neck pain no weakness no numbness no tingling no associated radicular symptoms.  Complaining of tightness and aching pain associated with lifting and prolonged activities.  Assessment & Plan: Visit Diagnoses:  1. Posterior tibial tendinitis, right leg   2. Upper back pain   3. Acute pain of  right shoulder   4. Posterior tibial tendon dysfunction (PTTD) of left lower extremity     Plan: Given a posterior tibial tendon brace for the left.  Discussed heel cord stretching for some metatarsalgia she is having.  She is also going to be referred to physical therapy for her bilateral lower extremities as well as her shoulder and back.  Follow-Up Instructions: Return in about 4 weeks (around 02/23/2020), or if symptoms worsen or fail to improve.   Ortho Exam  Patient is alert, oriented, no adenopathy, well-dressed, normal affect, normal respiratory effort.  On examination of the right and left foot and ankle she has some very mild soft tissue swelling medially.  There is no erythema no warmth she has tenderness along the course of the posterior tibial tendon bilaterally.  She is unable to do a single limb heel raise on either foot. On her left side she has a prominent hallux valgus deformity.  There is no associated skin issues no swelling she has bilateral strong palpable pulses  Imaging: No results found. No images are attached to the encounter.  Labs: Lab Results  Component Value Date   HGBA1C 7.9 (A) 11/04/2019   HGBA1C 6.8 06/12/2018   HGBA1C 8.4 02/27/2018   REPTSTATUS 11/23/2019 FINAL 11/21/2019   CULT  11/21/2019    NO GROUP A STREP (S.PYOGENES) ISOLATED Performed at [facility] [address] [place], [place] [address]      Lab Results  Component Value Date   ALBUMIN 4.5 11/04/2019   ALBUMIN 4.2 07/29/2019   ALBUMIN 4.3 07/12/2019    No results found for: MG No results found for: VD25OH  No results found for: PREALBUMIN CBC EXTENDED Latest Ref Rng & Units 07/29/2019 07/12/2019 01/16/2016  WBC 4.0 - 10.5 K/uL 7.2 8.9 7.2  RBC 3.87 - 5.11 MIL/uL 4.71 5.18(H) 4.98  HGB 12.0 - 15.0 g/dL 12.8 13.6 13.0  HCT 36.0 - 46.0 % 39.2 42.5 39.6  PLT 150 - 400 K/uL 304 349 318  NEUTROABS 1.7 - 7.7 K/uL 3.6 5.3 4.0  LYMPHSABS 0.7 - 4.0 K/uL 3.0 2.8 2.7     Body  mass index is 28.46 kg/m.  Orders:  Orders Placed This Encounter  Procedures  . Ambulatory referral to Physical Therapy   No orders of the defined types were placed in this encounter.    Procedures: No procedures performed  Clinical Data: No additional findings.  ROS:  All other systems negative, except as noted in the HPI. Review of Systems  Constitutional: Negative for chills and fever.  Musculoskeletal: Positive for back pain and myalgias.  Neurological: Positive for numbness. Negative for weakness.    Objective: Vital Signs: Ht 5\' 5"  (1.651 m)   Wt 171 lb (77.6 kg)   BMI 28.46 kg/m   Specialty Comments:  No specialty comments available.  PMFS History: Patient Active Problem List   Diagnosis Date Noted  . Posterior tibial tendinitis, right leg 01/26/2020  . Diabetes ([place]) 03/11/2017  . Muscle pain 08/06/2013  . Tiredness 08/06/2013  . Candida vaginitis 08/19/2012  . Dental cavities 08/19/2012  . Knee pain, bilateral 04/16/2012  . Diabetes mellitus type 2, controlled ([place]) 04/16/2012  . Hyperlipidemia 04/16/2012  . Palpitations 04/16/2012  . Back pain 04/16/2012   Past Medical History:  Diagnosis Date  . Arthritis   . Diabetes mellitus   . Hyperlipidemia     Family History  Problem Relation Age of Onset  . Diabetes Mother   . Heart disease Mother   . Diabetes Father   . Cancer Father 19       Throat  . Diabetes Sister   . Diabetes Brother     Past Surgical History:  Procedure Laterality Date  . CESAREAN SECTION     Social History   Occupational History  . Not on file  Tobacco Use  . Smoking status: Passive Smoke Exposure - Never Smoker  . Smokeless tobacco: Never Used  Substance and Sexual Activity  . Alcohol use: No  . Drug use: No  . Sexual activity: Yes

## 2020-01-26 NOTE — Patient Instructions (Signed)
Posterior Tibial Tendinitis Posterior tibial tendinitis is irritation of a tendon called the posterior tibial tendon. Your posterior tibial tendon is a cord-like tissue that connects bones of your lower leg and foot to a muscle that:  Supports your arch.  Helps you raise up on your toes.  Helps you turn your foot down and in. This condition causes foot and ankle pain. It can also lead to a flat foot. What are the causes? This condition is most often caused by repeated stress to the tendon (overuse injury). It can also be caused by a sudden injury that stresses the tendon, such as landing on your foot after jumping or falling. What increases the risk? This condition is more likely to develop in:  People who play a sport that involves putting a lot of pressure on the feet, such as: ? Basketball. ? Tennis. ? Soccer. ? Hockey.  Runners.  Females who are older than 52 years of age and are overweight.  People with diabetes.  People with decreased foot stability.  People with flat feet. What are the signs or symptoms? Symptoms include:  Pain in the inner ankle.  Pain at the arch of your foot.  Pain that gets worse with running, walking, or standing.  Swelling on the inside of your ankle and foot.  Weakness in your ankle or foot.  Inability to stand up on tiptoe.  Flattening of the arch of your foot. How is this diagnosed? This condition may be diagnosed based on:  Your symptoms.  Your medical history.  A physical exam.  Tests, such as: ? X-ray. ? MRI. ? Ultrasound. How is this treated? This condition may be treated by:  Putting ice to the injured area.  Taking NSAIDs, such as ibuprofen, to reduce pain and swelling.  Wearing a special shoe or shoe insert to support your arch (orthotic).  Having physical therapy.  Replacing high-impact exercise with low-impact exercise, such as swimming or cycling. If your symptoms do not improve with these treatments, you  may need to wear a splint, removable walking boot, or short leg cast for 6-8 weeks to keep your foot and ankle still (immobilized). Follow these instructions at home: If you have a cast, splint, or boot:  Keep it clean and dry.  Check the skin around it every day. Tell your health care provider about any concerns. If you have a cast:  Do not stick anything inside it to scratch your skin. Doing that increases your risk of infection.  You may put lotion on dry skin around the edges of the cast. Do not put lotion on the skin underneath the cast. If you have a splint or boot:  Wear it as told by your health care provider. Remove it only as told by your health care provider.  Loosen it if your toes tingle, become numb, or turn cold and blue. Bathing  Do not take baths, swim, or use a hot tub until your health care provider approves. Ask your health care provider if you may take showers.  If your cast, splint, or boot is not waterproof: ? Do not let it get wet. ? Cover it with a waterproof covering while you take a bath or a shower. Managing pain and swelling   If directed, put ice on the injured area. ? If you have a removable splint or boot, remove it as told by your health care provider. ? Put ice in a plastic bag. ? Place a towel between your skin and  the bag or between your cast and the bag. ? Leave the ice on for 20 minutes, 2-3 times a day.  Move your toes often to reduce stiffness and swelling.  Raise (elevate) the injured area above the level of your heart while you are sitting or lying down. Activity  Do not use the injured foot to support your body weight until your health care provider says that you can. Use crutches as told by your health care provider.  Do not do activities that make pain or swelling worse.  Ask your health care provider when it is safe to drive if you have a cast, splint, or boot on your foot.  Return to your normal activities as told by your  health care provider. Ask your health care provider what activities are safe for you.  Do exercises as told by your health care provider. General instructions  Take over-the-counter and prescription medicines only as told by your health care provider.  If you have an orthotic, use it as told by your health care provider.  Keep all follow-up visits as told by your health care provider. This is important. How is this prevented?  Wear footwear that is appropriate to your athletic activity.  Avoid athletic activities that cause pain or swelling in your ankle or foot.  Before being active, do range-of-motion and stretching exercises.  If you develop pain or swelling while training, stop training.  If you have pain or swelling that does not improve after a few days of rest, see your health care provider.  If you start a new athletic activity, start gradually so you can build up your strength and flexibility. Contact a health care provider if:  Your symptoms get worse.  Your symptoms do not improve in 6-8 weeks.  You develop new, unexplained symptoms.  Your splint, boot, or cast gets damaged. Summary  Posterior tibial tendinitis is irritation of a tendon called the posterior tibial tendon.  This condition is most often caused by repeated stress to the tendon (overuse injury).  This condition causes foot pain and ankle pain. It can also lead to a flat foot.  This condition may be treated by not doing high-impact activities, applying ice, having physical therapy, wearing orthotics, and wearing a cast, splint, or boot if needed. This information is not intended to replace advice given to you by your health care provider. Make sure you discuss any questions you have with your health care provider. Document Revised: 02/03/2019 Document Reviewed: 12/11/2018 Elsevier Patient Education  [employer].

## 2020-02-01 MED FILL — glipiZIDE 5 MG TABS: 5 | 90 days supply | Qty: 90 | Fill #1

## 2020-02-01 MED FILL — ATORVASTATIN 80 MG TABLET: 80 | 90 days supply | Qty: 90 | Fill #1

## 2020-02-01 MED FILL — AMLODIPINE BESYLATE 10 MG T: 10 | 90 days supply | Qty: 90 | Fill #1

## 2020-02-02 ENCOUNTER — Other Ambulatory Visit: Payer: Self-pay | Admitting: Family Medicine

## 2020-02-02 ENCOUNTER — Telehealth: Payer: Self-pay | Admitting: Family Medicine

## 2020-02-02 DIAGNOSIS — E119 Type 2 diabetes mellitus without complications: Secondary | ICD-10-CM

## 2020-02-02 MED ORDER — ONETOUCH VERIO REFLECT W/DEVICE KIT: 1.0000 | PACK | Freq: Three times a day (TID) | 0 refills | Status: AC

## 2020-02-02 MED ORDER — ONETOUCH VERIO VI STRP: ORAL_STRIP | 6 refills | Status: AC

## 2020-02-02 MED ORDER — ONETOUCH DELICA PLUS LANCET33G MISC: 1.0000 | Freq: Three times a day (TID) | 6 refills | Status: AC

## 2020-02-02 MED FILL — ONETOUCH DELICA PLUS LANCET: 33 days supply | Qty: 100 | Fill #0

## 2020-02-02 MED FILL — metFORMIN HCL 500 MG TABS: 500 | 90 days supply | Qty: 360 | Fill #1

## 2020-02-02 MED FILL — ONETOUCH VERIO REFLECT W/DE: W/DEVICE | 1 days supply | Qty: 1 | Fill #0

## 2020-02-02 MED FILL — ONE TOUCH VERIO TEST STRIP: 33 days supply | Qty: 100 | Fill #0

## 2020-02-02 NOTE — Telephone Encounter (Signed)
Pt came in the office asking for a one touch verio strips 20 lancers 8.75 meter 20 sent to [facility]

## 2020-02-02 NOTE — Telephone Encounter (Signed)
Rx for testing supplies sent.  

## 2020-02-03 ENCOUNTER — Ambulatory Visit: Payer: 59 | Admitting: Physical Therapy

## 2020-02-09 ENCOUNTER — Telehealth: Payer: Self-pay | Admitting: Orthopedic Surgery

## 2020-02-09 ENCOUNTER — Telehealth: Payer: Self-pay | Admitting: Family

## 2020-02-09 MED ORDER — CYCLOBENZAPRINE HCL 10 MG PO TABS: 10.0000 mg | ORAL_TABLET | Freq: Every day | ORAL | 1 refills | Status: DC

## 2020-02-09 MED ORDER — NAPROXEN 500 MG PO TABS: 500.0000 mg | ORAL_TABLET | Freq: Two times a day (BID) | ORAL | 1 refills | Status: DC

## 2020-02-09 NOTE — Telephone Encounter (Signed)
Please see below.

## 2020-02-09 NOTE — Addendum Note (Signed)
Addended by: Dondra Prader R on: 02/09/2020 01:08 PM   Modules accepted: Orders

## 2020-02-09 NOTE — Telephone Encounter (Signed)
sent 

## 2020-02-09 NOTE — Telephone Encounter (Signed)
Patient came into the clinic stating that Rebecca [name] was suppose to refill her RX's on her Naproxen and Cyclobenzaprine.  The patient uses [facility] on [address].  CB#[phone].  Thank you.

## 2020-02-09 NOTE — Telephone Encounter (Signed)
Patient missed her PT appointment on April 14th.  She stated that she did not know about the appointment because she did not understand due to language barrier.  CB#[phone].  Thank you.

## 2020-02-10 ENCOUNTER — Other Ambulatory Visit: Payer: Self-pay

## 2020-02-10 ENCOUNTER — Ambulatory Visit (INDEPENDENT_AMBULATORY_CARE_PROVIDER_SITE_OTHER): Payer: 59 | Admitting: Physical Therapy

## 2020-02-10 ENCOUNTER — Encounter: Payer: Self-pay | Admitting: Physical Therapy

## 2020-02-10 DIAGNOSIS — M545 Low back pain: Secondary | ICD-10-CM | POA: Diagnosis not present

## 2020-02-10 DIAGNOSIS — M25511 Pain in right shoulder: Secondary | ICD-10-CM

## 2020-02-10 DIAGNOSIS — M546 Pain in thoracic spine: Secondary | ICD-10-CM | POA: Diagnosis not present

## 2020-02-10 DIAGNOSIS — R600 Localized edema: Secondary | ICD-10-CM

## 2020-02-10 DIAGNOSIS — M25571 Pain in right ankle and joints of right foot: Secondary | ICD-10-CM

## 2020-02-10 DIAGNOSIS — G8929 Other chronic pain: Secondary | ICD-10-CM

## 2020-02-10 NOTE — Therapy (Signed)
[facility] Physical Therapy [address] [place], [place], [address] Phone: [phone]   Fax:  [phone]  Physical Therapy Evaluation  Patient Details  Name: Rebecca [name] MRN: [id number] Date of Birth: May 23, 1966 Referring Provider (PT): Suzan Slick, NP   Encounter Date: 02/10/2020  PT End of Session - 02/10/20 1055    Visit Number  1    Number of Visits  12    Date for PT Re-Evaluation  03/23/20    PT Start Time  0935    PT Stop Time  1020    PT Time Calculation (min)  45 min    Activity Tolerance  Patient tolerated treatment well;Patient limited by pain    Behavior During Therapy  [facility] for tasks assessed/performed       Past Medical History:  Diagnosis Date  . Arthritis   . Diabetes mellitus   . Hyperlipidemia     Past Surgical History:  Procedure Laterality Date  . CESAREAN SECTION      There were no vitals filed for this visit.   Subjective Assessment - 02/10/20 0944    Subjective  she is with interpreter and relays Rt leg pain, posterior tib tendinitis, upper back pain, Rt shoulder pain pain has been since november 2020 for back/shoulder pain, and pain in Rt leg since January 2021. 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 She has been wearing brace on her foot and relays this helps, she does not have it on now. She also is having difficulty breathing at time and her legs swell.    Pertinent History  PMH: lumbar anterolisthesis, DM    Limitations  Lifting;Standing;Walking;House hold activities    How long can you stand comfortably?  depends    How long can you walk comfortably?  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  Yes    Pain Score  9    also has mild to moderate back pain   Pain Location  Foot     Pain Orientation  Right    Pain Descriptors / Indicators  Aching    Pain Type  Chronic pain    Pain Radiating Towards  denies N/T in her legs    Pain Onset  More than a month ago    Pain Frequency  Constant    Aggravating Factors   standing, walking, work    Pain Relieving Factors  brace, pain meds, shoe insert    Effect of Pain on Daily Activities  limtis standing ADLS         [facility] PT Assessment - 02/10/20 0001      Assessment   Medical Diagnosis  Rt foot posterior tib syndrome, chronic back pain, Rt shoulder pain    Referring Provider (PT)  Suzan Slick, NP    Onset Date/Surgical Date  --   pain since Novemeber 2020   Next MD Visit  nothing scheduled      Precautions   Precautions  None      Restrictions   Weight Bearing Restrictions  No    Other Position/Activity Restrictions  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      Insurance underwriter Screen  Has the patient fallen in the past 6 months  No      [place] residence    Additional Comments  no steps or stairs      Prior Function   Level of Independence  Independent    Vocation  Full time employment    Vocation Requirements  standing work, Ecologist   Overall Cognitive Status  Within Functional Limits for tasks assessed      Observation/Other Assessments-Edema    Edema  --   mild to mod edema in Rt foot/ankle     Sensation   Light Touch  Appears Intact      Posture/Postural Control   Posture Comments  slumped posture, Flat feet with colapsed arches       ROM / Strength   AROM / PROM / Strength  AROM;Strength      AROM   Overall AROM Comments  lumbar ROM WFL, bilat ankle ROM WFL except very tight heelcords and DF limited to 90 deg/neutral only      Strength   Overall Strength Comments  ankle strength overall 4+/5, decreased strength in foot intrinsics      Flexibility    Soft Tissue Assessment /Muscle Length  --   very tight heelcords     Palpation   Palpation comment  TTP in Rt medial ankle and arch      Special Tests   Other special tests  + navicular drop test, negative SLR test      Transfers   Transfers  Independent with all Transfers      Ambulation/Gait   Gait Comments  ambulates no AD, community distances, slower velocity, feet in increased ER and pronation                Objective measurements completed on examination: See above findings.      [place] Adult PT Treatment/Exercise - 02/10/20 0001      Modalities   Modalities  Vasopneumatic      Vasopneumatic   Number Minutes Vasopneumatic   10 minutes    Vasopnuematic Location   Ankle    Vasopneumatic Pressure  Medium    Vasopneumatic Temperature   34             PT Education - 02/10/20 1042    Education Details  HEP, POC    Person(s) Educated  Patient    Methods  Explanation;Demonstration;Verbal cues;Handout    Comprehension  Verbalized understanding;Need further instruction          PT Long Term Goals - 02/10/20 1118      PT LONG TERM GOAL #1   Title  Pt will be I and compliant with HEP. (target for all goals 6 weeks 03/23/20)    Status  New      PT LONG TERM GOAL #2   Title  Pt will decrease pain overall by 50% with standing activites    Baseline  9    Status  New      PT LONG TERM GOAL #3   Title  Pt will improve bilat ankle DF ROM to at least 5 degrees past neutral to show improved flexibility in heel cords to decrease overall pain    Status  New             Plan - 02/10/20 1055    Clinical Impression Statement  Pt presents with Rt leg pain, posterior tib tendinitis,  and chronicback pain/lumbar anteriolisthesis, Rt shoulder  pain has been since november 2020 for back/shoulder pain, and pain in Rt leg since January 2021. She will benefit from skilled PT to address her functional deficits listed below with proposed interventions listed below     Personal Factors and Comorbidities  Comorbidity 2    Comorbidities  PMH: lumbar anterolisthesis, DM    Examination-Activity Limitations  Bend;Carry;Squat;Stairs;Stand;Lift;Locomotion Level;Transfers    Examination-Participation Restrictions  Community Activity;Cleaning;Meal Prep;Shop;Laundry;Yard Work    Merchant navy officer  Evolving/Moderate complexity    Clinical Decision Making  Moderate    Rehab Potential  Good    PT Frequency  Other (comment)   1-2   PT Duration  6 weeks    PT Treatment/Interventions  ADLs/Self Care Home Management;Aquatic Therapy;Cryotherapy;Electrical Stimulation;Iontophoresis 4mg /ml Dexamethasone;Moist Heat;Traction;Ultrasound;Stair training;Therapeutic activities;Therapeutic exercise;Neuromuscular re-education;Balance training;Patient/family education;Manual techniques;Passive range of motion;Joint Manipulations;Spinal Manipulations;Dry needling;Taping;Vasopneumatic Device    PT Next Visit Plan  review HEP, further examine back, Rt shoulder, SLS    PT Home Exercise Plan  Access Code: [email or web address]    Consulted and Agree with Plan of Care  Patient       Patient will benefit from skilled therapeutic intervention in order to improve the following deficits and impairments:  Abnormal gait, Decreased activity tolerance, Decreased balance, Decreased endurance, Decreased range of motion, Hypomobility, Difficulty walking, Increased fascial restricitons, Increased muscle spasms, Impaired flexibility, Postural dysfunction, Improper body mechanics, Pain  Visit Diagnosis: Pain in right ankle and joints of right foot  Pain in thoracic spine  Chronic bilateral low back pain without sciatica  Chronic right shoulder pain  Localized edema     Problem List Patient Active Problem List   Diagnosis Date Noted  . Posterior tibial tendinitis, right leg 01/26/2020  . Diabetes ([place]) 03/11/2017  . Muscle pain 08/06/2013  . Tiredness 08/06/2013  . Candida  vaginitis 08/19/2012  . Dental cavities 08/19/2012  . Knee pain, bilateral 04/16/2012  . Diabetes mellitus type 2, controlled ([place]) 04/16/2012  . Hyperlipidemia 04/16/2012  . Palpitations 04/16/2012  . Back pain 04/16/2012    Silvestre Mesi 02/10/2020, 11:58 AM  [facility] Physical Therapy [address] [place], [place], [address] Phone: [phone]   Fax:  [phone]  Name: Rebecca [name] MRN: [id number] Date of Birth: 1966-10-11

## 2020-02-10 NOTE — Patient Instructions (Signed)
Access Code: [email or web address]: [email or web address]: 04/21/2021Prepared by: Aaron Edelman NelsonExercises  Standing Gastroc Stretch - 2 x daily - 6 x weekly - 10 reps - 3 sets  Standing Soleus Stretch - 2 x daily - 6 x weekly - 10 reps - 3 sets  Heel Toe Raises with Counter Support - 2 x daily - 6 x weekly - 10 reps - 2 sets  Seated Arch Lifts - 2 x daily - 6 x weekly - 10 reps - 2-3 sets  Sidelying Mid Thoracic Rotation - 2 x daily - 6 x weekly - 10 reps - 1 sets - 10 hold  Seated Scapular Retraction - 2 x daily - 6 x weekly - 10 reps - 3 sets

## 2020-02-10 NOTE — Therapy (Signed)
[facility] Physical Therapy [address] [place], [place], [address] Phone: [phone]   Fax:  [phone]  Physical Therapy Evaluation  Patient Details  Name: Rebecca [name] MRN: [id number] Date of Birth: 22-Jun-1966 Referring Provider (PT): Suzan Slick, NP   Encounter Date: 02/10/2020  PT End of Session - 02/10/20 1055    Visit Number  1    Number of Visits  12    Date for PT Re-Evaluation  03/23/20    PT Start Time  0935    PT Stop Time  1020    PT Time Calculation (min)  45 min    Activity Tolerance  Patient tolerated treatment well;Patient limited by pain    Behavior During Therapy  [facility] for tasks assessed/performed       Past Medical History:  Diagnosis Date  . Arthritis   . Diabetes mellitus   . Hyperlipidemia     Past Surgical History:  Procedure Laterality Date  . CESAREAN SECTION      There were no vitals filed for this visit.   Subjective Assessment - 02/10/20 0944    Subjective  she is with interpreter and relays Rt leg pain, posterior tib tendinitis, upper back pain, Rt shoulder pain pain has been since november 2020 for back/shoulder pain, and pain in Rt leg since January 2021. 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 She has been wearing brace on her foot and relays this helps, she does not have it on now. She also is having difficulty breathing at time and her legs swell.    Pertinent History  PMH: lumbar anterolisthesis, DM    Limitations  Lifting;Standing;Walking;House hold activities    How long can you stand comfortably?  depends    How long can you walk comfortably?  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  Yes    Pain Score  9    also has mild to moderate back pain   Pain Location  Foot     Pain Orientation  Right    Pain Descriptors / Indicators  Aching    Pain Type  Chronic pain    Pain Radiating Towards  denies N/T in her legs    Pain Onset  More than a month ago    Pain Frequency  Constant    Aggravating Factors   standing, walking, work    Pain Relieving Factors  brace, pain meds, shoe insert    Effect of Pain on Daily Activities  limtis standing ADLS         [facility] PT Assessment - 02/10/20 0001      Assessment   Medical Diagnosis  Rt foot posterior tib syndrome, chronic back pain, Rt shoulder pain    Referring Provider (PT)  Suzan Slick, NP    Onset Date/Surgical Date  --   pain since Novemeber 2020   Next MD Visit  nothing scheduled      Precautions   Precautions  None      Restrictions   Weight Bearing Restrictions  No    Other Position/Activity Restrictions  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      Insurance underwriter Screen  Has the patient fallen in the past 6 months  No      [place] residence    Additional Comments  no steps or stairs      Prior Function   Level of Independence  Independent    Vocation  Full time employment    Vocation Requirements  standing work, Ecologist   Overall Cognitive Status  Within Functional Limits for tasks assessed      Observation/Other Assessments-Edema    Edema  --   mild to mod edema in Rt foot/ankle     Sensation   Light Touch  Appears Intact      Posture/Postural Control   Posture Comments  slumped posture, Flat feet with colapsed arches       ROM / Strength   AROM / PROM / Strength  AROM;Strength      AROM   Overall AROM Comments  lumbar ROM WFL, bilat ankle ROM WFL except very tight heelcords and DF limited to 90 deg/neutral only      Strength   Overall Strength Comments  ankle strength overall 4+/5, decreased strength in foot intrinsics      Flexibility    Soft Tissue Assessment /Muscle Length  --   very tight heelcords     Palpation   Palpation comment  TTP in Rt medial ankle and arch      Special Tests   Other special tests  + navicular drop test, negative SLR test      Transfers   Transfers  Independent with all Transfers      Ambulation/Gait   Gait Comments  ambulates no AD, community distances, slower velocity, feet in increased ER and pronation                Objective measurements completed on examination: See above findings.      [place] Adult PT Treatment/Exercise - 02/10/20 0001      Modalities   Modalities  Vasopneumatic      Vasopneumatic   Number Minutes Vasopneumatic   10 minutes    Vasopnuematic Location   Ankle    Vasopneumatic Pressure  Medium    Vasopneumatic Temperature   34             PT Education - 02/10/20 1042    Education Details  HEP, POC    Person(s) Educated  Patient    Methods  Explanation;Demonstration;Verbal cues;Handout    Comprehension  Verbalized understanding;Need further instruction          PT Long Term Goals - 02/10/20 1118      PT LONG TERM GOAL #1   Title  Pt will be I and compliant with HEP. (target for all goals 6 weeks 03/23/20)    Status  New      PT LONG TERM GOAL #2   Title  Pt will decrease pain overall by 50% with standing activites    Baseline  9    Status  New      PT LONG TERM GOAL #3   Title  Pt will improve bilat ankle DF ROM to at least 5 degrees past neutral to show improved flexibility in heel cords to decrease overall pain    Status  New             Plan - 02/10/20 1055    Clinical Impression Statement  Pt presents with Rt leg pain, posterior tib tendinitis,  and chronicback pain/lumbar anteriolisthesis, Rt shoulder  pain has been since november 2020 for back/shoulder pain, and pain in Rt leg since January 2021. She will benefit from skilled PT to address her functional deficits listed below with proposed interventions listed below     Personal Factors and Comorbidities  Comorbidity 2    Comorbidities  PMH: lumbar anterolisthesis, DM    Examination-Activity Limitations  Bend;Carry;Squat;Stairs;Stand;Lift;Locomotion Level;Transfers    Examination-Participation Restrictions  Community Activity;Cleaning;Meal Prep;Shop;Laundry;Yard Work    Merchant navy officer  Evolving/Moderate complexity    Clinical Decision Making  Moderate    Rehab Potential  Good    PT Frequency  Other (comment)   1-2   PT Duration  6 weeks    PT Treatment/Interventions  ADLs/Self Care Home Management;Aquatic Therapy;Cryotherapy;Electrical Stimulation;Iontophoresis 4mg /ml Dexamethasone;Moist Heat;Traction;Ultrasound;Stair training;Therapeutic activities;Therapeutic exercise;Neuromuscular re-education;Balance training;Patient/family education;Manual techniques;Passive range of motion;Joint Manipulations;Spinal Manipulations;Dry needling;Taping;Vasopneumatic Device    PT Next Visit Plan  review HEP, further examine back, Rt shoulder, SLS    PT Home Exercise Plan  Access Code: [email or web address]    Consulted and Agree with Plan of Care  Patient       Patient will benefit from skilled therapeutic intervention in order to improve the following deficits and impairments:  Abnormal gait, Decreased activity tolerance, Decreased balance, Decreased endurance, Decreased range of motion, Hypomobility, Difficulty walking, Increased fascial restricitons, Increased muscle spasms, Impaired flexibility, Postural dysfunction, Improper body mechanics, Pain  Visit Diagnosis: Pain in right ankle and joints of right foot  Pain in thoracic spine  Chronic bilateral low back pain without sciatica  Chronic right shoulder pain  Localized edema     Problem List Patient Active Problem List   Diagnosis Date Noted  . Posterior tibial tendinitis, right leg 01/26/2020  . Diabetes ([place]) 03/11/2017  . Muscle pain 08/06/2013  . Tiredness 08/06/2013  . Candida  vaginitis 08/19/2012  . Dental cavities 08/19/2012  . Knee pain, bilateral 04/16/2012  . Diabetes mellitus type 2, controlled ([place]) 04/16/2012  . Hyperlipidemia 04/16/2012  . Palpitations 04/16/2012  . Back pain 04/16/2012    Debbe Odea 02/10/2020, 11:43 AM  [facility] Physical Therapy [address] [place], [place], [address] Phone: [phone]   Fax:  [phone]  Name: Rebecca [name] MRN: [id number] Date of Birth: 10-May-1966

## 2020-02-17 ENCOUNTER — Other Ambulatory Visit: Payer: Self-pay

## 2020-02-17 ENCOUNTER — Ambulatory Visit (INDEPENDENT_AMBULATORY_CARE_PROVIDER_SITE_OTHER): Payer: 59 | Admitting: Rehabilitative and Restorative Service Providers"

## 2020-02-17 ENCOUNTER — Encounter: Payer: Self-pay | Admitting: Rehabilitative and Restorative Service Providers"

## 2020-02-17 DIAGNOSIS — M545 Low back pain: Secondary | ICD-10-CM | POA: Diagnosis not present

## 2020-02-17 DIAGNOSIS — M25511 Pain in right shoulder: Secondary | ICD-10-CM | POA: Diagnosis not present

## 2020-02-17 DIAGNOSIS — M25571 Pain in right ankle and joints of right foot: Secondary | ICD-10-CM | POA: Diagnosis not present

## 2020-02-17 DIAGNOSIS — G8929 Other chronic pain: Secondary | ICD-10-CM

## 2020-02-17 DIAGNOSIS — M546 Pain in thoracic spine: Secondary | ICD-10-CM

## 2020-02-17 DIAGNOSIS — R600 Localized edema: Secondary | ICD-10-CM

## 2020-02-17 NOTE — Therapy (Signed)
[facility] Physical Therapy [address] [place], [place], [address] Phone: [phone]   Fax:  [phone]  Physical Therapy Treatment  Patient Details  Name: Rebecca [name] MRN: [id number] Date of Birth: January 11, 1966 Referring Provider (PT): Suzan Slick, NP   Encounter Date: 02/17/2020  PT End of Session - 02/17/20 0945    Visit Number  2    Number of Visits  12    Date for PT Re-Evaluation  03/23/20    PT Start Time  0845    PT Stop Time  0930    PT Time Calculation (min)  45 min    Activity Tolerance  Patient tolerated treatment well;Patient limited by pain    Behavior During Therapy  [facility] for tasks assessed/performed       Past Medical History:  Diagnosis Date  . Arthritis   . Diabetes mellitus   . Hyperlipidemia     Past Surgical History:  Procedure Laterality Date  . CESAREAN SECTION      There were no vitals filed for this visit.  Subjective Assessment - 02/17/20 0936    Subjective  Rebecca [name] had several questions with her HEP.  There is definately a language barrier and the interpreter was very valuable with today's treatment.    Patient is accompained by:  Interpreter    Pertinent History  PMH: lumbar anterolisthesis, DM    Limitations  Lifting;Standing;Walking;House hold activities    How long can you stand comfortably?  depends    How long can you walk comfortably?  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" Foot XR 2021 shows "calcaneal spur"    Patient Stated Goals  reduce pain    Pain Score  9     Pain Location  Foot    Pain Onset  More than a month ago                       [facility] Adult PT Treatment/Exercise - 02/17/20 0001      Exercises   Exercises  Ankle;Shoulder      Shoulder Exercises: Sidelying   Other Sidelying Exercises  Side lie thoracic rotations 5X each side      Shoulder Exercises: Standing   Other Standing Exercises  Shoulder  blade pinches   3 sets of 5  5 second hold     Ankle Exercises: Stretches   Gastroc Stretch  5 reps;20 seconds   3 sets   Gastroc Stretch Limitations  Golda needed lots of corrective feedback and direction    Slant Board Stretch  3 reps;60 seconds   Gastrocnemius   Slant Board Stretch Limitations  Slight toe in with all stretches      Ankle Exercises: Standing   Heel Raises  10 reps;3 seconds   3 sets and slow eccentrics.  Needed lots of corrective feedb   Heel Raises Limitations  Encourage her to go straight up and not just roll on the ball of her foot.                  PT Long Term Goals - 02/10/20 1118      PT LONG TERM GOAL #1   Title  Pt will be I and compliant with HEP. (target for all goals 6 weeks 03/23/20)    Status  New      PT LONG TERM GOAL #2   Title  Pt will decrease pain overall  by 50% with standing activites    Baseline  9    Status  New      PT LONG TERM GOAL #3   Title  Pt will improve bilat ankle DF ROM to at least 5 degrees past neutral to show improved flexibility in heel cords to decrease overall pain    Status  New            Plan - 02/17/20 0945    Clinical Impression Statement  Rebecca [name] was not completing her HEP the right way.  She required a lot of feedback and correction to complete activities correctly.  With repetition and assistance from her interpreter, she was able to complete 4 activities correctly.  She was encouraged to focus on these over the next week with follow-up and possible additions at that time.    Personal Factors and Comorbidities  Comorbidity 2    Comorbidities  PMH: lumbar anterolisthesis, DM    Examination-Activity Limitations  Bend;Carry;Squat;Stairs;Stand;Lift;Locomotion Level;Transfers    Examination-Participation Restrictions  Community Activity;Cleaning;Meal Prep;Shop;Laundry;Yard Work    Merchant navy officer  Evolving/Moderate complexity    Rehab Potential  Good    PT Frequency  Other (comment)    1-2   PT Duration  6 weeks    PT Treatment/Interventions  ADLs/Self Care Home Management;Aquatic Therapy;Cryotherapy;Electrical Stimulation;Iontophoresis 4mg /ml Dexamethasone;Moist Heat;Traction;Ultrasound;Stair training;Therapeutic activities;Therapeutic exercise;Neuromuscular re-education;Balance training;Patient/family education;Manual techniques;Passive range of motion;Joint Manipulations;Spinal Manipulations;Dry needling;Taping;Vasopneumatic Device    PT Next Visit Plan  Assess progress with current HEP and progress as appropriate.  She may need a lot of feedback.    PT Home Exercise Plan  Access Code: [email or web address]    Consulted and Agree with Plan of Care  Patient       Patient will benefit from skilled therapeutic intervention in order to improve the following deficits and impairments:  Abnormal gait, Decreased activity tolerance, Decreased balance, Decreased endurance, Decreased range of motion, Hypomobility, Difficulty walking, Increased fascial restricitons, Increased muscle spasms, Impaired flexibility, Postural dysfunction, Improper body mechanics, Pain  Visit Diagnosis: Pain in right ankle and joints of right foot  Pain in thoracic spine  Chronic bilateral low back pain without sciatica  Chronic right shoulder pain  Localized edema     Problem List Patient Active Problem List   Diagnosis Date Noted  . Posterior tibial tendinitis, right leg 01/26/2020  . Diabetes ([place]) 03/11/2017  . Muscle pain 08/06/2013  . Tiredness 08/06/2013  . Candida vaginitis 08/19/2012  . Dental cavities 08/19/2012  . Knee pain, bilateral 04/16/2012  . Diabetes mellitus type 2, controlled ([place]) 04/16/2012  . Hyperlipidemia 04/16/2012  . Palpitations 04/16/2012  . Back pain 04/16/2012    Farley Ly PT, MPT 02/17/2020, 9:50 AM  [facility] Physical Therapy [address] [place], [place], [address] Phone: [phone]   Fax:  [phone]  Name: Rebecca [name] MRN: [id number] Date of Birth: November 18, 1965

## 2020-02-19 ENCOUNTER — Ambulatory Visit (INDEPENDENT_AMBULATORY_CARE_PROVIDER_SITE_OTHER): Payer: 59 | Admitting: Physical Therapy

## 2020-02-19 ENCOUNTER — Other Ambulatory Visit: Payer: Self-pay

## 2020-02-19 DIAGNOSIS — M546 Pain in thoracic spine: Secondary | ICD-10-CM | POA: Diagnosis not present

## 2020-02-19 DIAGNOSIS — G8929 Other chronic pain: Secondary | ICD-10-CM

## 2020-02-19 DIAGNOSIS — M545 Low back pain: Secondary | ICD-10-CM

## 2020-02-19 DIAGNOSIS — M25571 Pain in right ankle and joints of right foot: Secondary | ICD-10-CM

## 2020-02-19 DIAGNOSIS — M25511 Pain in right shoulder: Secondary | ICD-10-CM | POA: Diagnosis not present

## 2020-02-19 DIAGNOSIS — R600 Localized edema: Secondary | ICD-10-CM

## 2020-02-19 NOTE — Therapy (Signed)
[facility] Physical Therapy [address] [place], [place], [address] Phone: [phone]   Fax:  [phone]  Physical Therapy Treatment  Patient Details  Name: Rebecca [name] MRN: [id number] Date of Birth: 12-16-1965 Referring Provider (PT): Suzan Slick, NP   Encounter Date: 02/19/2020  PT End of Session - 02/19/20 1006    Visit Number  3    Number of Visits  12    Date for PT Re-Evaluation  03/23/20    PT Start Time  0853    PT Stop Time  0931    PT Time Calculation (min)  38 min    Activity Tolerance  Patient tolerated treatment well;Patient limited by pain    Behavior During Therapy  [facility] for tasks assessed/performed       Past Medical History:  Diagnosis Date  . Arthritis   . Diabetes mellitus   . Hyperlipidemia     Past Surgical History:  Procedure Laterality Date  . CESAREAN SECTION      There were no vitals filed for this visit.  Subjective Assessment - 02/19/20 0903    Subjective  relays the standing gastroc-soleus stretch hurts her knee so this was modified to seated today and slantboard was recommended to her. She relays she is currently 9/10 pain in her feet today after working    Patient is accompained by:  Interpreter   [employer]   Pertinent History  PMH: lumbar anterolisthesis, DM    Limitations  Lifting;Standing;Walking;House hold activities    How long can you stand comfortably?  depends    How long can you walk comfortably?  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" Foot XR 2021 shows "calcaneal spur"    Patient Stated Goals  reduce pain    Pain Onset  More than a month ago         [facility] PT Assessment - 02/19/20 0001      AROM   Overall AROM Comments  bilat DF ROM limited to 2 deg                   [facility] Adult PT Treatment/Exercise - 02/19/20 0001      Exercises   Exercises  Other Exercises    Other Exercises   Nu step 6 min L5, seat 7  for 6 min UE/LE, seated gastroc stretch 30 sec X 3 bilat with strap, attempted seated soleus stretch but relays it hurts too much and does not feel stretch so discontinued and moved to slantboard stretch 4X30 seconds. heel toe raises off step 4X5, standing rows red band 2X10, supine ankle Tband X 10 reps all planes on Lt, no resistance on Rt AROM only 4 planes due to pain X 10 reps.                  PT Long Term Goals - 02/10/20 1118      PT LONG TERM GOAL #1   Title  Pt will be I and compliant with HEP. (target for all goals 6 weeks 03/23/20)    Status  New      PT LONG TERM GOAL #2   Title  Pt will decrease pain overall by 50% with standing activites    Baseline  9    Status  New      PT LONG TERM GOAL #3   Title  Pt will improve bilat ankle DF ROM to at least 5 degrees  past neutral to show improved flexibility in heel cords to decrease overall pain    Status  New            Plan - 02/19/20 1006    Clinical Impression Statement  Needs max cues for demo and verbal for correct technique, reps, sets for exercises. Her standing gastroc-soleus stretch was modified to seated as she says she has too much knee pain with this in standing. Attempted ankle 4 way with theraband but she has poor movement coordination and had pain on her Rt side to performed AROM only on right side. Provided continued education on keeping execises and stretches in gentle ROM and to not push through too much pain. Continue POC    Personal Factors and Comorbidities  Comorbidity 2    Comorbidities  PMH: lumbar anterolisthesis, DM    Examination-Activity Limitations  Bend;Carry;Squat;Stairs;Stand;Lift;Locomotion Level;Transfers    Examination-Participation Restrictions  Community Activity;Cleaning;Meal Prep;Shop;Laundry;Yard Work    [employer] officer  Evolving/Moderate complexity    Rehab Potential  Good    PT Frequency  Other (comment)   1-2   PT Duration  6 weeks    PT  Treatment/Interventions  ADLs/Self Care Home Management;Aquatic Therapy;Cryotherapy;Electrical Stimulation;Iontophoresis 4mg /ml Dexamethasone;Moist Heat;Traction;Ultrasound;Stair training;Therapeutic activities;Therapeutic exercise;Neuromuscular re-education;Balance training;Patient/family education;Manual techniques;Passive range of motion;Joint Manipulations;Spinal Manipulations;Dry needling;Taping;Vasopneumatic Device    PT Next Visit Plan  Assess progress with current HEP and progress as appropriate.  She may need a lot of feedback.    PT Home Exercise Plan  Access Code: [email or web address]    Consulted and Agree with Plan of Care  Patient       Patient will benefit from skilled therapeutic intervention in order to improve the following deficits and impairments:  Abnormal gait, Decreased activity tolerance, Decreased balance, Decreased endurance, Decreased range of motion, Hypomobility, Difficulty walking, Increased fascial restricitons, Increased muscle spasms, Impaired flexibility, Postural dysfunction, Improper body mechanics, Pain  Visit Diagnosis: Pain in right ankle and joints of right foot  Pain in thoracic spine  Chronic bilateral low back pain without sciatica  Chronic right shoulder pain  Localized edema     Problem List Patient Active Problem List   Diagnosis Date Noted  . Posterior tibial tendinitis, right leg 01/26/2020  . Diabetes ([place]) 03/11/2017  . Muscle pain 08/06/2013  . Tiredness 08/06/2013  . Candida vaginitis 08/19/2012  . Dental cavities 08/19/2012  . Knee pain, bilateral 04/16/2012  . Diabetes mellitus type 2, controlled ([place]) 04/16/2012  . Hyperlipidemia 04/16/2012  . Palpitations 04/16/2012  . Back pain 04/16/2012    Rebecca [name] 02/19/2020, 10:10 AM  [facility] Physical Therapy [address] [place], [place], [address] Phone: [phone]   Fax:  [phone]  Name: Rebecca [name] MRN: [id number] Date of Birth:  12/06/1965

## 2020-02-23 ENCOUNTER — Encounter: Payer: 59 | Admitting: Rehabilitative and Restorative Service Providers"

## 2020-02-24 ENCOUNTER — Telehealth: Payer: Self-pay | Admitting: Family Medicine

## 2020-02-24 NOTE — Telephone Encounter (Signed)
Patient came in and dropped off a disability parking placard to be filled out by PCP. Form will be put in PCP box.

## 2020-02-24 NOTE — Telephone Encounter (Signed)
Paperwork has been received and patient will be called once its ready for pick up.

## 2020-02-25 ENCOUNTER — Other Ambulatory Visit: Payer: Self-pay

## 2020-02-25 ENCOUNTER — Ambulatory Visit (INDEPENDENT_AMBULATORY_CARE_PROVIDER_SITE_OTHER): Payer: 59 | Admitting: Physical Therapy

## 2020-02-25 ENCOUNTER — Encounter: Payer: Self-pay | Admitting: Physical Therapy

## 2020-02-25 DIAGNOSIS — M545 Low back pain, unspecified: Secondary | ICD-10-CM

## 2020-02-25 DIAGNOSIS — M25571 Pain in right ankle and joints of right foot: Secondary | ICD-10-CM

## 2020-02-25 DIAGNOSIS — M546 Pain in thoracic spine: Secondary | ICD-10-CM

## 2020-02-25 DIAGNOSIS — M25511 Pain in right shoulder: Secondary | ICD-10-CM

## 2020-02-25 DIAGNOSIS — G8929 Other chronic pain: Secondary | ICD-10-CM

## 2020-02-25 DIAGNOSIS — R600 Localized edema: Secondary | ICD-10-CM

## 2020-02-25 NOTE — Therapy (Signed)
[facility] Physical Therapy [address] [place], [place], [address] Phone: [phone]   Fax:  [phone]  Physical Therapy Treatment  Patient Details  Name: Rebecca [name] MRN: [id number] Date of Birth: [birth year] Referring Provider (PT): Suzan Slick, NP   Encounter Date: 02/25/2020  PT End of Session - 02/25/20 0856    Visit Number  4    Number of Visits  12    Date for PT Re-Evaluation  03/23/20    PT Start Time  0851    PT Stop Time  0930    PT Time Calculation (min)  39 min    Activity Tolerance  Patient tolerated treatment well;Patient limited by pain    Behavior During Therapy  [facility] for tasks assessed/performed       Past Medical History:  Diagnosis Date  . Arthritis   . Diabetes mellitus   . Hyperlipidemia     Past Surgical History:  Procedure Laterality Date  . CESAREAN SECTION      There were no vitals filed for this visit.  Subjective Assessment - 02/25/20 0854    Subjective  Pt arriving to therapy reporting 5/10 pain in her R ankle.    Patient is accompained by:  Interpreter    Pertinent History  PMH: lumbar anterolisthesis, DM    Limitations  Lifting;Standing;Walking;House hold activities    How long can you stand comfortably?  depends    How long can you walk comfortably?  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" Foot XR 2021 shows "calcaneal spur"    Patient Stated Goals  reduce pain                       [facility] Adult PT Treatment/Exercise - 02/25/20 0001      Shoulder Exercises: Seated   Row  AROM;Strengthening;15 reps;Limitations    Row Limitations  deep breating for thoracic expansion      Modalities   Modalities  Moist Heat      Moist Heat Therapy   Number Minutes Moist Heat  8 Minutes   applied while performing deep breathing and rows   Moist Heat Location  Ankle      Manual Therapy   Manual Therapy  Joint mobilization    Manual therapy comments  PROM R ankle all directions    Joint Mobilization  gentle subtal mobs,       Ankle Exercises: Seated   BAPS  Sitting;Level 2;10 reps;Limitations    BAPS Limitations  unable to perform complete revolutions    Other Seated Ankle Exercises  4 way ankle exercises using Level 1 theraband x 10 each direrction      Ankle Exercises: Stretches   Slant Board Stretch  3 reps;30 seconds    Other Stretch  seated rocking on rocker board df/pf, and inv/eversion x 20 reps each direction      Ankle Exercises: Aerobic   Nustep  L4 x 6 minutes      Ankle Exercises: Standing   Heel Raises  Both;15 reps                  PT Long Term Goals - 02/25/20 0857      PT LONG TERM GOAL #1   Title  Pt will be I and compliant with HEP. (target for all goals 6 weeks 03/23/20)    Status  On-going      PT LONG TERM  GOAL #2   Title  Pt will decrease pain overall by 50% with standing activites    Status  On-going      PT LONG TERM GOAL #3   Title  Pt will improve bilat ankle DF ROM to at least 5 degrees past neutral to show improved flexibility in heel cords to decrease overall pain    Status  On-going            Plan - 02/25/20 0940    Clinical Impression Statement  Pt arriving reporting 5/10 pain in her R ankle. Pt tolerating more standing/seated and AROM exericses today. Pt performed 4 way Level 1 theraband exercises today progressing with strengthening. We also discussed deep breathing with rows for posture and R UE. Continue skilled PT as pt tolerates progressing toward goals set.    Personal Factors and Comorbidities  Comorbidity 2    Comorbidities  PMH: lumbar anterolisthesis, DM    Examination-Activity Limitations  Bend;Carry;Squat;Stairs;Stand;Lift;Locomotion Level;Transfers    Examination-Participation Restrictions  Community Activity;Cleaning;Meal Prep;Shop;Laundry;Yard Work    [employer] officer  Evolving/Moderate complexity    Rehab Potential   Good    PT Frequency  Other (comment)    PT Duration  6 weeks    PT Treatment/Interventions  ADLs/Self Care Home Management;Aquatic Therapy;Cryotherapy;Electrical Stimulation;Iontophoresis 4mg /ml Dexamethasone;Moist Heat;Traction;Ultrasound;Stair training;Therapeutic activities;Therapeutic exercise;Neuromuscular re-education;Balance training;Patient/family education;Manual techniques;Passive range of motion;Joint Manipulations;Spinal Manipulations;Dry needling;Taping;Vasopneumatic Device    PT Next Visit Plan  Assess progress with current HEP and progress as appropriate.  She may need a lot of feedback.    PT Home Exercise Plan  Access Code: [email or web address]       Patient will benefit from skilled therapeutic intervention in order to improve the following deficits and impairments:  Abnormal gait, Decreased activity tolerance, Decreased balance, Decreased endurance, Decreased range of motion, Hypomobility, Difficulty walking, Increased fascial restricitons, Increased muscle spasms, Impaired flexibility, Postural dysfunction, Improper body mechanics, Pain  Visit Diagnosis: Pain in right ankle and joints of right foot  Pain in thoracic spine  Chronic bilateral low back pain without sciatica  Chronic right shoulder pain  Localized edema     Problem List Patient Active Problem List   Diagnosis Date Noted  . Posterior tibial tendinitis, right leg 01/26/2020  . Diabetes ([place]) 03/11/2017  . Muscle pain 08/06/2013  . Tiredness 08/06/2013  . Candida vaginitis 08/19/2012  . Dental cavities 08/19/2012  . Knee pain, bilateral 04/16/2012  . Diabetes mellitus type 2, controlled ([place]) 04/16/2012  . Hyperlipidemia 04/16/2012  . Palpitations 04/16/2012  . Back pain 04/16/2012    Oretha Caprice, PT, MPT 02/25/2020, 9:48 AM  [facility] Physical Therapy [address] [place], [place], [address] Phone: [phone]   Fax:  [phone]  Name: Rebecca [name] MRN:  [id number] Date of Birth: 04/29/1966

## 2020-03-01 ENCOUNTER — Ambulatory Visit (INDEPENDENT_AMBULATORY_CARE_PROVIDER_SITE_OTHER): Payer: 59 | Admitting: Physical Therapy

## 2020-03-01 ENCOUNTER — Encounter: Payer: Self-pay | Admitting: Physical Therapy

## 2020-03-01 ENCOUNTER — Other Ambulatory Visit: Payer: Self-pay

## 2020-03-01 DIAGNOSIS — M546 Pain in thoracic spine: Secondary | ICD-10-CM

## 2020-03-01 DIAGNOSIS — G8929 Other chronic pain: Secondary | ICD-10-CM

## 2020-03-01 DIAGNOSIS — M25511 Pain in right shoulder: Secondary | ICD-10-CM | POA: Diagnosis not present

## 2020-03-01 DIAGNOSIS — M25571 Pain in right ankle and joints of right foot: Secondary | ICD-10-CM

## 2020-03-01 DIAGNOSIS — M545 Low back pain: Secondary | ICD-10-CM | POA: Diagnosis not present

## 2020-03-01 DIAGNOSIS — R600 Localized edema: Secondary | ICD-10-CM

## 2020-03-01 NOTE — Therapy (Signed)
[facility] Physical Therapy [address] [place], [place], [address] Phone: [phone]   Fax:  [phone]  Physical Therapy Treatment  Patient Details  Name: Rebecca [name] MRN: [id number] Date of Birth: 05/22/1966 Referring Provider (PT): Suzan Slick, NP   Encounter Date: 03/01/2020  PT End of Session - 03/01/20 0856    Visit Number  5    Number of Visits  12    Date for PT Re-Evaluation  03/23/20    PT Start Time  0846    PT Stop Time  0928    PT Time Calculation (min)  42 min    Activity Tolerance  Patient tolerated treatment well;Patient limited by pain       Past Medical History:  Diagnosis Date  . Arthritis   . Diabetes mellitus   . Hyperlipidemia     Past Surgical History:  Procedure Laterality Date  . CESAREAN SECTION      There were no vitals filed for this visit.  Subjective Assessment - 03/01/20 0853    Subjective  Pt arriving to therapy reporting 5/10 pain in her R ankle. Pt states she is having SOB at night. Pt also reporting that her MD wanted to schedule an MRI of her back,  but pt states, "I'm scared".    Patient is accompained by:  Interpreter    Pertinent History  PMH: lumbar anterolisthesis, DM    Limitations  Lifting;Standing;Walking;House hold activities    How long can you stand comfortably?  depends    How long can you walk comfortably?  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  Yes    Pain Score  5     Pain Location  Foot    Pain Orientation  Right    Pain Descriptors / Indicators  Aching    Pain Type  Chronic pain    Pain Onset  More than a month ago                       [facility] Adult PT Treatment/Exercise - 03/01/20 0001      Shoulder Exercises: Seated   Row  AROM;Strengthening;15 reps;Limitations    Row Limitations  deep breating for thoracic expansion      Modalities   Modalities  Moist Heat      Moist Heat Therapy   Number Minutes Moist Heat  5 Minutes    Moist Heat Location  Ankle      Manual Therapy   Manual Therapy  Joint mobilization;Soft tissue mobilization;Passive ROM    Manual therapy comments  10 minutes    Joint Mobilization  gentle subtal mobs,     Soft tissue mobilization  IASTM to plantar surface of R foot    Passive ROM  PROM R ankle      Ankle Exercises: Stretches   Slant Board Stretch  3 reps;30 seconds      Ankle Exercises: Aerobic   Recumbent Bike  L1 6 minutes   pt reported the bike helped her knees     Ankle Exercises: Standing   Heel Raises  Both;15 reps    Other Standing Ankle Exercises  rocker board, df/pf and inv/eversion x 2 minutes each      Ankle Exercises: Machines for Strengthening   Cybex Leg Press  --      Ankle Exercises: Seated  Other Seated Ankle Exercises  4 way ankle exercises using Level 1 theraband x 10 each direrction                  PT Long Term Goals - 02/25/20 0857      PT LONG TERM GOAL #1   Title  Pt will be I and compliant with HEP. (target for all goals 6 weeks 03/23/20)    Status  On-going      PT LONG TERM GOAL #2   Title  Pt will decrease pain overall by 50% with standing activites    Status  On-going      PT LONG TERM GOAL #3   Title  Pt will improve bilat ankle DF ROM to at least 5 degrees past neutral to show improved flexibility in heel cords to decrease overall pain    Status  On-going            Plan - 03/01/20 [id number]    Clinical Impression Statement  Pt arriving to therapy reporting 5/10 R ankle pain. Pt also reporting R knee pain. Pt stated after riding the bike her knee felt better. Pt tolerating exericses well with concentration on standing and strengthening. IASTM to plantar surface and gentle mobs at end of sesison. Pt reporting drop in pain from 5 - 3/10 at end of session.    Personal Factors and Comorbidities  Comorbidity 2    Comorbidities   PMH: lumbar anterolisthesis, DM    Examination-Activity Limitations  Bend;Carry;Squat;Stairs;Stand;Lift;Locomotion Level;Transfers    Examination-Participation Restrictions  Community Activity;Cleaning;Meal Prep;Shop;Laundry;Yard Work    [employer] officer  Evolving/Moderate complexity    Rehab Potential  Good    PT Frequency  Other (comment)    PT Duration  6 weeks    PT Treatment/Interventions  ADLs/Self Care Home Management;Aquatic Therapy;Cryotherapy;Electrical Stimulation;Iontophoresis 4mg /ml Dexamethasone;Moist Heat;Traction;Ultrasound;Stair training;Therapeutic activities;Therapeutic exercise;Neuromuscular re-education;Balance training;Patient/family education;Manual techniques;Passive range of motion;Joint Manipulations;Spinal Manipulations;Dry needling;Taping;Vasopneumatic Device    PT Next Visit Plan  Assess progress with current HEP and progress as appropriate.  She may need a lot of feedback.    PT Home Exercise Plan  Access Code: [email or web address]    Consulted and Agree with Plan of Care  Patient       Patient will benefit from skilled therapeutic intervention in order to improve the following deficits and impairments:  Abnormal gait, Decreased activity tolerance, Decreased balance, Decreased endurance, Decreased range of motion, Hypomobility, Difficulty walking, Increased fascial restricitons, Increased muscle spasms, Impaired flexibility, Postural dysfunction, Improper body mechanics, Pain  Visit Diagnosis: Pain in right ankle and joints of right foot  Pain in thoracic spine  Chronic bilateral low back pain without sciatica  Chronic right shoulder pain  Localized edema     Problem List Patient Active Problem List   Diagnosis Date Noted  . Posterior tibial tendinitis, right leg 01/26/2020  . Diabetes ([place]) 03/11/2017  . Muscle pain 08/06/2013  . Tiredness 08/06/2013  . Candida vaginitis 08/19/2012  . Dental cavities 08/19/2012  . Knee pain, bilateral  04/16/2012  . Diabetes mellitus type 2, controlled ([place]) 04/16/2012  . Hyperlipidemia 04/16/2012  . Palpitations 04/16/2012  . Back pain 04/16/2012    Oretha Caprice, PT, MPT 03/01/2020, 9:30 AM  [facility] Physical Therapy [address] [place], [place], [address] Phone: [phone]   Fax:  [phone]  Name: Rebecca [name] MRN: [id number] Date of Birth: 1965/12/10

## 2020-03-03 ENCOUNTER — Encounter: Payer: 59 | Admitting: Physical Therapy

## 2020-03-03 ENCOUNTER — Telehealth: Payer: Self-pay

## 2020-03-03 NOTE — Telephone Encounter (Signed)
Call was placed to patient and a voicemail was left informing patient that paperwork is ready.  paperwork has been placed upfront in folder.

## 2020-03-08 ENCOUNTER — Encounter: Payer: Self-pay | Admitting: Physical Therapy

## 2020-03-08 ENCOUNTER — Other Ambulatory Visit: Payer: Self-pay

## 2020-03-08 ENCOUNTER — Ambulatory Visit (INDEPENDENT_AMBULATORY_CARE_PROVIDER_SITE_OTHER): Payer: 59 | Admitting: Physical Therapy

## 2020-03-08 DIAGNOSIS — G8929 Other chronic pain: Secondary | ICD-10-CM

## 2020-03-08 DIAGNOSIS — M546 Pain in thoracic spine: Secondary | ICD-10-CM

## 2020-03-08 DIAGNOSIS — M545 Low back pain, unspecified: Secondary | ICD-10-CM

## 2020-03-08 DIAGNOSIS — R600 Localized edema: Secondary | ICD-10-CM | POA: Diagnosis not present

## 2020-03-08 DIAGNOSIS — M25511 Pain in right shoulder: Secondary | ICD-10-CM

## 2020-03-08 DIAGNOSIS — M25571 Pain in right ankle and joints of right foot: Secondary | ICD-10-CM | POA: Diagnosis not present

## 2020-03-08 NOTE — Therapy (Signed)
[facility] [address] [place], [place], [address] Phone: [phone]   Fax:  [phone]  Physical Therapy Treatment  Patient Details  Name: Rebecca [name] MRN: [id number] Date of Birth: 1966/10/22 Referring Provider (PT): Suzan Slick, NP   Encounter Date: 03/08/2020  PT End of Session - 03/08/20 0903    Visit Number  6    Number of Visits  12    Date for PT Re-Evaluation  03/23/20    PT Start Time  0858    PT Stop Time  0930    PT Time Calculation (min)  32 min    Activity Tolerance  Patient tolerated treatment well;Patient limited by pain    Behavior During Therapy  [facility] for tasks assessed/performed       Past Medical History:  Diagnosis Date  . Arthritis   . Diabetes mellitus   . Hyperlipidemia     Past Surgical History:  Procedure Laterality Date  . CESAREAN SECTION      There were no vitals filed for this visit.  Subjective Assessment - 03/08/20 0901    Subjective  Pt arriving to therapy reporting 2/10 pain in her R ankle. Pt reporting she was at the beach last week and did a lot of walking. Pt also stated she used a bike at the hotel  while on vacation.    Patient is accompained by:  Interpreter    Pertinent History  PMH: lumbar anterolisthesis, DM    Limitations  Lifting;Standing;Walking;House hold activities    How long can you stand comfortably?  depends    How long can you walk comfortably?  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  Yes    Pain Score  2     Pain Location  Ankle    Pain Orientation  Right    Pain Descriptors / Indicators  Aching    Pain Type  Chronic pain    Pain Onset  More than a month ago                        [facility] Adult PT Treatment/Exercise - 03/08/20 0001      Shoulder Exercises: Seated   Row   AROM;Strengthening;15 reps;Limitations      Manual Therapy   Manual Therapy  Joint mobilization;Soft tissue mobilization;Passive ROM    Manual therapy comments  10 minutes    Joint Mobilization  gentle subtal mobs,     Soft tissue mobilization  IASTM to plantar surface of R foot    Passive ROM  PROM R ankle      Ankle Exercises: Aerobic   Stationary Bike  L3 x 6 minutes      Ankle Exercises: Standing   Heel Raises  Both;15 reps    Other Standing Ankle Exercises  rocker board, df/pf and inv/eversion x 2 minutes each      Ankle Exercises: Seated   Other Seated Ankle Exercises  4 way ankle exercises using Level 1 theraband x 10 each direrction                  PT Long Term Goals - 03/08/20 0910      PT LONG TERM GOAL #1   Title  Pt will be I and compliant with HEP. (target for  all goals 6 weeks 03/23/20)    Status  On-going      PT LONG TERM GOAL #2   Title  Pt will decrease pain overall by 50% with standing activites    Status  On-going      PT LONG TERM GOAL #3   Title  Pt will improve bilat ankle DF ROM to at least 5 degrees past neutral to show improved flexibility in heel cords to decrease overall pain    Status  On-going            Plan - 03/08/20 7341    Clinical Impression Statement  Pt tolerating exercises well and reporting less stiffness at end of session. Pt did report increased pain after walking on the beach over the weekend. Progressing toward SLS activies however pt's R SLS time was 5 seconds and L SLS time was 3 seconds with no UE support. Pt was instructed to stand at her kitchen sink and work on balance. Continue skilled PT to progress toward pt's LTG's no goals met this session.    Personal Factors and Comorbidities  Comorbidity 2    Comorbidities  PMH: lumbar anterolisthesis, DM    Examination-Activity Limitations  Bend;Carry;Squat;Stairs;Stand;Lift;Locomotion Level;Transfers    Examination-Participation Restrictions  Community  Activity;Cleaning;Meal Prep;Shop;Laundry;Yard Work    [employer] officer  Evolving/Moderate complexity    Rehab Potential  Good    PT Frequency  Other (comment)    PT Duration  6 weeks    PT Treatment/Interventions  ADLs/Self Care Home Management;Aquatic Therapy;Cryotherapy;Electrical Stimulation;Iontophoresis 80m/ml Dexamethasone;Moist Heat;Traction;Ultrasound;Stair training;Therapeutic activities;Therapeutic exercise;Neuromuscular re-education;Balance training;Patient/family education;Manual techniques;Passive range of motion;Joint Manipulations;Spinal Manipulations;Dry needling;Taping;Vasopneumatic Device    PT Next Visit Plan  Assess progress with current HEP and progress as appropriate.  She may need a lot of feedback.    PT Home Exercise Plan  Access Code: [email or web address]    Consulted and Agree with Plan of Care  Patient       Patient will benefit from skilled therapeutic intervention in order to improve the following deficits and impairments:  Abnormal gait, Decreased activity tolerance, Decreased balance, Decreased endurance, Decreased range of motion, Hypomobility, Difficulty walking, Increased fascial restricitons, Increased muscle spasms, Impaired flexibility, Postural dysfunction, Improper body mechanics, Pain  Visit Diagnosis: Pain in right ankle and joints of right foot  Pain in thoracic spine  Chronic bilateral low back pain without sciatica  Localized edema  Chronic right shoulder pain     Problem List Patient Active Problem List   Diagnosis Date Noted  . Posterior tibial tendinitis, right leg 01/26/2020  . Diabetes ([place] 03/11/2017  . Muscle pain 08/06/2013  . Tiredness 08/06/2013  . Candida vaginitis 08/19/2012  . Dental cavities 08/19/2012  . Knee pain, bilateral 04/16/2012  . Diabetes mellitus type 2, controlled ([place] 04/16/2012  . Hyperlipidemia 04/16/2012  . Palpitations 04/16/2012  . Back pain 04/16/2012    JOretha Caprice PT,  MPT 03/08/2020, 9:57 AM  [facility] [address] [address]Phone: [phone]  Fax:  [phone] Name: Rebecca GRANDERSONMRN: [id number] of Birth: 103-31-1967

## 2020-03-11 ENCOUNTER — Ambulatory Visit: Payer: Self-pay

## 2020-03-11 ENCOUNTER — Encounter: Payer: Self-pay | Admitting: Rehabilitative and Restorative Service Providers"

## 2020-03-11 ENCOUNTER — Ambulatory Visit (INDEPENDENT_AMBULATORY_CARE_PROVIDER_SITE_OTHER): Payer: 59 | Admitting: Rehabilitative and Restorative Service Providers"

## 2020-03-11 ENCOUNTER — Other Ambulatory Visit: Payer: Self-pay

## 2020-03-11 ENCOUNTER — Ambulatory Visit (INDEPENDENT_AMBULATORY_CARE_PROVIDER_SITE_OTHER): Payer: 59 | Admitting: Family

## 2020-03-11 ENCOUNTER — Encounter: Payer: Self-pay | Admitting: Family

## 2020-03-11 VITALS — Ht 65.0 in | Wt 171.0 lb

## 2020-03-11 DIAGNOSIS — M546 Pain in thoracic spine: Secondary | ICD-10-CM | POA: Diagnosis not present

## 2020-03-11 DIAGNOSIS — M25571 Pain in right ankle and joints of right foot: Secondary | ICD-10-CM

## 2020-03-11 DIAGNOSIS — R600 Localized edema: Secondary | ICD-10-CM

## 2020-03-11 DIAGNOSIS — M542 Cervicalgia: Secondary | ICD-10-CM

## 2020-03-11 DIAGNOSIS — G8929 Other chronic pain: Secondary | ICD-10-CM

## 2020-03-11 DIAGNOSIS — M545 Low back pain, unspecified: Secondary | ICD-10-CM

## 2020-03-11 DIAGNOSIS — M25511 Pain in right shoulder: Secondary | ICD-10-CM

## 2020-03-11 DIAGNOSIS — M5416 Radiculopathy, lumbar region: Secondary | ICD-10-CM

## 2020-03-11 MED ORDER — PREDNISONE 50 MG PO TABS: ORAL_TABLET | ORAL | 0 refills | Status: DC

## 2020-03-11 NOTE — Therapy (Addendum)
[facility] [address] [place], [place], [address] Phone: [phone]   Fax:  [phone]  Physical Therapy Treatment/Discharge  Patient Details  Name: Rebecca [name] MRN: [id number] Date of Birth: 12-21-1965 Referring Provider (PT): Suzan Slick, NP   Encounter Date: 03/11/2020  PT End of Session - 03/11/20 0857    Visit Number  7    Number of Visits  12    Date for PT Re-Evaluation  03/23/20    PT Start Time  0851    PT Stop Time  0930    PT Time Calculation (min)  39 min    Activity Tolerance  Patient tolerated treatment well;Patient limited by pain    Behavior During Therapy  [facility] for tasks assessed/performed       Past Medical History:  Diagnosis Date  . Arthritis   . Diabetes mellitus   . Hyperlipidemia     Past Surgical History:  Procedure Laterality Date  . CESAREAN SECTION      There were no vitals filed for this visit.  Subjective Assessment - 03/11/20 0856    Subjective  Pt. indicated feeling pain in neck and Lt hip in last day or so.  Pt. stated overall the last few visits seemed to be helpful.  Rated Lt hip pain at 10/10 at times.    Patient is accompained by:  Interpreter    Pertinent History  PMH: lumbar anterolisthesis, DM    Limitations  Lifting;Standing;Walking;House hold activities    How long can you stand comfortably?  depends    How long can you walk comfortably?  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  Yes    Pain Score  10-Worst pain ever   at times   Pain Location  Hip    Pain Orientation  Left    Pain Descriptors / Indicators  Aching    Pain Type  Chronic pain    Pain Onset  More than a month ago    Pain Frequency  Constant    Aggravating Factors   Insidious onset, sometimes worse c standing/walking                         [facility] Adult PT Treatment/Exercise - 03/11/20 0001      Shoulder Exercises: Standing   Extension  Strengthening;Both;Other (comment)   3 x 10   Theraband Level (Shoulder Extension)  Level 3 (Green)    Row  Strengthening;Both;Other (comment)   3 x 10   Theraband Level (Shoulder Row)  Level 3 (Green)      Ankle Exercises: Aerobic   Nustep  Lvl 6 7 mins UE/LE      Ankle Exercises: Stretches   Slant Board Stretch  3 reps;30 seconds   bilateral   Other Stretch  seated baps lvl 2 fwd/back, circles cw, ccw x 15 each      Ankle Exercises: Standing   Other Standing Ankle Exercises  lateral step up 6 inch 2 x 10 bilateral       Ankle Exercises: Seated   Other Seated Ankle Exercises  seated clam shell green band 3 x 10 bilateral                  PT Long Term Goals - 03/08/20 6415  PT LONG TERM GOAL #1   Title  Pt will be I and compliant with HEP. (target for all goals 6 weeks 03/23/20)    Status  On-going      PT LONG TERM GOAL #2   Title  Pt will decrease pain overall by 50% with standing activites    Status  On-going      PT LONG TERM GOAL #3   Title  Pt will improve bilat ankle DF ROM to at least 5 degrees past neutral to show improved flexibility in heel cords to decrease overall pain    Status  On-going            Plan - 03/11/20 0923    Clinical Impression Statement  Fair control in ankle stability intervention today at best.  Various reported symptoms in bilateral LE at times but overall tolerated intervention fair to good.  Pt. may continue to benefit from skilled PT services to improve mobility/strength and movement coordination.    Personal Factors and Comorbidities  Comorbidity 2    Comorbidities  PMH: lumbar anterolisthesis, DM    Examination-Activity Limitations  Bend;Carry;Squat;Stairs;Stand;Lift;Locomotion Level;Transfers    Examination-Participation Restrictions  Community Activity;Cleaning;Meal  Prep;Shop;Laundry;Yard Work    [employer] officer  Evolving/Moderate complexity    Rehab Potential  Good    PT Frequency  Other (comment)    PT Duration  6 weeks    PT Treatment/Interventions  ADLs/Self Care Home Management;Aquatic Therapy;Cryotherapy;Electrical Stimulation;Iontophoresis 61m/ml Dexamethasone;Moist Heat;Traction;Ultrasound;Stair training;Therapeutic activities;Therapeutic exercise;Neuromuscular re-education;Balance training;Patient/family education;Manual techniques;Passive range of motion;Joint Manipulations;Spinal Manipulations;Dry needling;Taping;Vasopneumatic Device    PT Next Visit Plan  Balance intervention complaint/non complaint surfaces in addition to POC    PT Home Exercise Plan  Access Code: [email or web address]    Consulted and Agree with Plan of Care  Patient       Patient will benefit from skilled therapeutic intervention in order to improve the following deficits and impairments:  Abnormal gait, Decreased activity tolerance, Decreased balance, Decreased endurance, Decreased range of motion, Hypomobility, Difficulty walking, Increased fascial restricitons, Increased muscle spasms, Impaired flexibility, Postural dysfunction, Improper body mechanics, Pain  Visit Diagnosis: Pain in right ankle and joints of right foot  Pain in thoracic spine  Chronic bilateral low back pain without sciatica  Localized edema  Chronic right shoulder pain     Problem List Patient Active Problem List   Diagnosis Date Noted  . Posterior tibial tendinitis, right leg 01/26/2020  . Diabetes ([place] 03/11/2017  . Muscle pain 08/06/2013  . Tiredness 08/06/2013  . Candida vaginitis 08/19/2012  . Dental cavities 08/19/2012  . Knee pain, bilateral 04/16/2012  . Diabetes mellitus type 2, controlled ([place] 04/16/2012  . Hyperlipidemia 04/16/2012  . Palpitations 04/16/2012  . Back pain 04/16/2012   MScot Jun PT, DPT, OCS, ATC 03/11/20  9:25 AM  PHYSICAL THERAPY  DISCHARGE SUMMARY  Visits from Start of Care:7  Current functional level related to goals / functional outcomes: See note   Remaining deficits: See note   Education / Equipment: HEP Plan: Patient agrees to discharge.  Patient goals were partially met. Patient is being discharged due to not returning since the last visit.  ?????    MScot Jun PT, DPT, OCS, ATC 05/24/20  1:52 PM       [facility] [address] [address]Phone: [phone]  Fax:  [phone] Name: Rebecca BOENINGMRN: [id number] of Birth: [birth year]

## 2020-03-11 NOTE — Patient Instructions (Addendum)
Pinched Nerve A pinched nerve is an injury that occurs when too much pressure is placed on a nerve. This pressure can cause pain, burning, and muscle weakness in places that the nerve supplies feeling to, such as an arm, hand, or leg, or the back or neck. If a nerve is severely pinched or has been pinched for a long time, permanent nerve damage can occur. What are the causes? This condition may be caused by:  A nerve passing through a narrow area between bones or other body structures.  Arthritis that causes bones to press on a nerve.  Loss of blood supply to a nerve.  A nerve being stretched from an injury.  A sudden injury with swelling.  Long-term wear on the nerve.  Age-related changes in the spine. What are the signs or symptoms? The most common symptoms of a pinched nerve are feeling a tingling sensation and numbness. Other symptoms include:  Pain that spreads from one area of the body part to another.  A burning feeling.  Muscle weakness. How is this diagnosed? This condition may be diagnosed based on:  A physical exam. During the exam, your health care provider will: ? Check for numbness and muscle weakness. ? Move affected body parts to test for pain.  X-rays to check for bone damage.  A MRI or CT scan to check for conditions that may be causing nerve damage.  A muscle test (electromyogram, EMG) to evaluate how your muscles and nerves communicate. How is this treated?     A pinched nerve is usually treated first by:  Resting the affected body area.  Using devices to help you move without pain (supportive or protective devices), such as a splint, brace, or neck collar. Other treatments depend on your symptoms and the amount of nerve damage you have. Other treatments may include:  Medicines, such as: ? Injections of numbing medicine. ? NSAIDs. ? Pain medicines. ? Steroid medicines. These may be given as a pill or as an injection.  Physical therapy to  relieve pain, maintain movement, and improve muscle strength.  Surgery. This may be done if other treatments do not work. Follow these instructions at home:      Take over-the-counter and prescription medicines only as told by your health care provider.  Wear supportive or protective devices as told by your health care provider.  Do physical therapy exercises as directed.  Ask your health care provider what activities are safe for you.  Rest as needed.  If directed, put ice on the affected area: ? Put ice in a plastic bag. ? Place a towel between your skin and the bag. ? Leave the ice on for 20 minutes, 2-3 times a day.  If directed, apply heat to the affected area. Use the heat source that your health care provider recommends, such as a moist heat pack or a heating pad. ? Place a towel between your skin and the heat source. ? Leave the heat on for 20-30 minutes. ? Remove the heat if your skin turns bright red. This is especially important if you are unable to feel pain, heat, or cold. You may have a greater risk of getting burned.  Do not drive or use heavy machinery while taking prescription pain medicine.  Keep all follow-up visits as told by your health care provider. This is important. Contact a health care provider if:  Your condition does not improve with treatment.  Your pain, numbness, or weakness suddenly gets worse. Get help   right away if you:  Have loss of bladder control (urinary incontinence) or you cannot urinate.  Cannot control bowel movements (fecal incontinence).  Have new weakness in your arms or legs. Summary  A pinched nerve is an injury that occurs when too much pressure is placed on a nerve.  This pressure can cause pain, burning, and muscle weakness in places that the nerve supplies feeling to, such as an arm, hand, or leg, or the back or neck.  Take over-the-counter and prescription medicines only as told by your health care provider.  Ask  your health care provider what activities are safe for you while you are having symptoms. This information is not intended to replace advice given to you by your health care provider. Make sure you discuss any questions you have with your health care provider. Document Revised: 10/25/2017 Document Reviewed: 10/22/2017 Elsevier Patient Education  [place].  Radicular Pain Radicular pain is a type of pain that spreads from your back or neck along a spinal nerve. Spinal nerves are nerves that leave the spinal cord and go to the muscles. Radicular pain is sometimes called radiculopathy, radiculitis, or a pinched nerve. When you have this type of pain, you may also have weakness, numbness, or tingling in the area of your body that is supplied by the nerve. The pain may feel sharp and burning. Depending on which spinal nerve is affected, the pain may occur in the:  Neck area (cervical radicular pain). You may also feel pain, numbness, weakness, or tingling in the arms.  Mid-spine area (thoracic radicular pain). You would feel this pain in the back and chest. This type is rare.  Lower back area (lumbar radicular pain). You would feel this pain as low back pain. You may feel pain, numbness, weakness, or tingling in the buttocks or legs. Sciatica is a type of lumbar radicular pain that shoots down the back of the leg. Radicular pain occurs when one of the spinal nerves becomes irritated or squeezed (compressed). It is often caused by something pushing on a spinal nerve, such as one of the bones of the spine (vertebrae) or one of the round cushions between vertebrae (intervertebral disks). This can result from:  An injury.  Wear and tear or aging of a disk.  The growth of a bone spur that pushes on the nerve. Radicular pain often goes away when you follow instructions from your health care provider for relieving pain at home. Follow these instructions at home: Managing pain      If  directed, put ice on the affected area: ? Put ice in a plastic bag. ? Place a towel between your skin and the bag. ? Leave the ice on for 20 minutes, 2-3 times a day.  If directed, apply heat to the affected area as often as told by your health care provider. Use the heat source that your health care provider recommends, such as a moist heat pack or a heating pad. ? Place a towel between your skin and the heat source. ? Leave the heat on for 20-30 minutes. ? Remove the heat if your skin turns bright red. This is especially important if you are unable to feel pain, heat, or cold. You may have a greater risk of getting burned. Activity   Do not sit or rest in bed for long periods of time.  Try to stay as active as possible. Ask your health care provider what type of exercise or activity is best for  you.  Avoid activities that make your pain worse, such as bending and lifting.  Do not lift anything that is heavier than 10 lb (4.5 kg), or the limit that you are told, until your health care provider says that it is safe.  Practice using proper technique when lifting items. Proper lifting technique involves bending your knees and rising up.  Do strength and range-of-motion exercises only as told by your health care provider or physical therapist. General instructions  Take over-the-counter and prescription medicines only as told by your health care provider.  Pay attention to any changes in your symptoms.  Keep all follow-up visits as told by your health care provider. This is important. ? Your health care provider may send you to a physical therapist to help with this pain. Contact a health care provider if:  Your pain and other symptoms get worse.  Your pain medicine is not helping.  Your pain has not improved after a few weeks of home care.  You have a fever. Get help right away if:  You have severe pain, weakness, or numbness.  You have difficulty with bladder or bowel  control. Summary  Radicular pain is a type of pain that spreads from your back or neck along a spinal nerve.  When you have radicular pain, you may also have weakness, numbness, or tingling in the area of your body that is supplied by the nerve.  The pain may feel sharp or burning.  Radicular pain may be treated with ice, heat, medicines, or physical therapy. This information is not intended to replace advice given to you by your health care provider. Make sure you discuss any questions you have with your health care provider. Document Revised: 04/22/2018 Document Reviewed: 04/22/2018 Elsevier Patient Education  [place].  Cervical Strain and Sprain Rehab Ask your health care provider which exercises are safe for you. Do exercises exactly as told by your health care provider and adjust them as directed. It is normal to feel mild stretching, pulling, tightness, or discomfort as you do these exercises. Stop right away if you feel sudden pain or your pain gets worse. Do not begin these exercises until told by your health care provider. Stretching and range-of-motion exercises Cervical side bending  1. Using good posture, sit on a stable chair or stand up. 2. Without moving your shoulders, slowly tilt your left / right ear to your shoulder until you feel a stretch in the opposite side neck muscles. You should be looking straight ahead. 3. Hold for __________ seconds. 4. Repeat with the other side of your neck. Repeat __________ times. Complete this exercise __________ times a day. Cervical rotation  1. Using good posture, sit on a stable chair or stand up. 2. Slowly turn your head to the side as if you are looking over your left / right shoulder. ? Keep your eyes level with the ground. ? Stop when you feel a stretch along the side and the back of your neck. 3. Hold for __________ seconds. 4. Repeat this by turning to your other side. Repeat __________ times. Complete this  exercise __________ times a day. Thoracic extension and pectoral stretch 1. Roll a towel or a small blanket so it is about 4 inches (10 cm) in diameter. 2. Lie down on your back on a firm surface. 3. Put the towel lengthwise, under your spine in the middle of your back. It should not be under your shoulder blades. The towel should line up with  your spine from your middle back to your lower back. 4. Put your hands behind your head and let your elbows fall out to your sides. 5. Hold for __________ seconds. Repeat __________ times. Complete this exercise __________ times a day. Strengthening exercises Isometric upper cervical flexion 1. Lie on your back with a thin pillow behind your head and a small rolled-up towel under your neck. 2. Gently tuck your chin toward your chest and nod your head down to look toward your feet. Do not lift your head off the pillow. 3. Hold for __________ seconds. 4. Release the tension slowly. Relax your neck muscles completely before you repeat this exercise. Repeat __________ times. Complete this exercise __________ times a day. Isometric cervical extension  1. Stand about 6 inches (15 cm) away from a wall, with your back facing the wall. 2. Place a soft object, about 6-8 inches (15-20 cm) in diameter, between the back of your head and the wall. A soft object could be a small pillow, a ball, or a folded towel. 3. Gently tilt your head back and press into the soft object. Keep your jaw and forehead relaxed. 4. Hold for __________ seconds. 5. Release the tension slowly. Relax your neck muscles completely before you repeat this exercise. Repeat __________ times. Complete this exercise __________ times a day. Posture and body mechanics Body mechanics refers to the movements and positions of your body while you do your daily activities. Posture is part of body mechanics. Good posture and healthy body mechanics can help to relieve stress in your body's tissues and  joints. Good posture means that your spine is in its natural S-curve position (your spine is neutral), your shoulders are pulled back slightly, and your head is not tipped forward. The following are general guidelines for applying improved posture and body mechanics to your everyday activities. Sitting  1. When sitting, keep your spine neutral and keep your feet flat on the floor. Use a footrest, if necessary, and keep your thighs parallel to the floor. Avoid rounding your shoulders, and avoid tilting your head forward. 2. When working at a desk or a computer, keep your desk at a height where your hands are slightly lower than your elbows. Slide your chair under your desk so you are close enough to maintain good posture. 3. When working at a computer, place your monitor at a height where you are looking straight ahead and you do not have to tilt your head forward or downward to look at the screen. Standing   When standing, keep your spine neutral and keep your feet about hip-width apart. Keep a slight bend in your knees. Your ears, shoulders, and hips should line up.  When you do a task in which you stand in one place for a long time, place one foot up on a stable object that is 2-4 inches (5-10 cm) high, such as a footstool. This helps keep your spine neutral. Resting When lying down and resting, avoid positions that are most painful for you. Try to support your neck in a neutral position. You can use a contour pillow or a small rolled-up towel. Your pillow should support your neck but not push on it. This information is not intended to replace advice given to you by your health care provider. Make sure you discuss any questions you have with your health care provider. Document Revised: 01/28/2019 Document Reviewed: 07/09/2018 Elsevier Patient Education  [place].

## 2020-03-30 NOTE — Progress Notes (Signed)
Office Visit Note   Patient: Rebecca [name]           Date of Birth: February 10, 1966           MRN: [id number] Visit Date: 03/11/2020              Requested by: [name], MD [place],  [place] [id number] PCP: [name], MD  Chief Complaint  Patient presents with  . Spine - Pain      HPI: The patient is a 54 year old woman seen today for evaluation of neck and low back pain. She has been having issues with both for months now. Cannot recall how this started.  No associated injury. The neck pain is to the sides of her neck she is not pointing to the spinous processes.  She denies any numbness or tingling no radicular type symptoms.  Aching waxing and waning pain of the neck sometimes to the upper back. The low back pain she is having has been ongoing for quite some time we have treated her her for this previously.  She has not had any new red flag symptoms note radicular symptoms no numbness and tingling in her legs.  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  Has been in physical therapy for her low back pain as well as some thoracic spine pain.  Her most recent therapy visit was just this morning.  She feels this has been moderately helpful.  Is open to continuing.  Assessment & Plan: Visit Diagnoses:  1. Neck pain   2. Radiculopathy, lumbar region     Plan:  Follow-Up Instructions: No follow-ups on file.   Ortho Exam  Patient is alert, oriented, no adenopathy, well-dressed, normal affect, normal respiratory effort.   Imaging: No results found. No images are attached to the encounter.  Labs: Lab Results  Component Value Date   HGBA1C 7.9 (A) 11/04/2019   HGBA1C 6.8 06/12/2018   HGBA1C 8.4 02/27/2018   REPTSTATUS 11/23/2019 FINAL 11/21/2019   CULT  11/21/2019    NO GROUP A STREP (S.PYOGENES) ISOLATED Performed at [facility], [place]  [address], [place], [place] [address]      Lab Results  Component Value Date   ALBUMIN 4.5 11/04/2019   ALBUMIN 4.2 07/29/2019   ALBUMIN 4.3 07/12/2019    No results found for: MG No results found for: VD25OH  No results found for: PREALBUMIN CBC EXTENDED Latest Ref Rng & Units 07/29/2019 07/12/2019 01/16/2016  WBC 4.0 - 10.5 K/uL 7.2 8.9 7.2  RBC 3.87 - 5.11 MIL/uL 4.71 5.18(H) 4.98  HGB 12.0 - 15.0 g/dL 12.8 13.6 13.0  HCT 36.0 - 46.0 % 39.2 42.5 39.6  PLT 150 - 400 K/uL 304 349 318  NEUTROABS 1.7 - 7.7 K/uL 3.6 5.3 4.0  LYMPHSABS 0.7 - 4.0 K/uL 3.0 2.8 2.7     Body mass index is 28.46 kg/m.  Orders:  Orders Placed This Encounter  Procedures  . XR Lumbar Spine 2-3 Views  . XR Cervical Spine 2 or 3 views   Meds ordered this encounter  Medications  . predniSONE (DELTASONE) 50 MG tablet    Sig: Take one tablet by mouth once daily for 5 days.    Dispense:  5 tablet    Refill:  0     Procedures: No procedures performed  Clinical Data: No additional findings.  ROS:  All other systems  negative, except as noted in the HPI. Review of Systems  Objective: Vital Signs: Ht 5\' 5"  (1.651 m)   Wt 171 lb (77.6 kg)   BMI 28.46 kg/m   Specialty Comments:  No specialty comments available.  PMFS History: Patient Active Problem List   Diagnosis Date Noted  . Posterior tibial tendinitis, right leg 01/26/2020  . Diabetes ([place]) 03/11/2017  . Muscle pain 08/06/2013  . Tiredness 08/06/2013  . Candida vaginitis 08/19/2012  . Dental cavities 08/19/2012  . Knee pain, bilateral 04/16/2012  . Diabetes mellitus type 2, controlled ([place]) 04/16/2012  . Hyperlipidemia 04/16/2012  . Palpitations 04/16/2012  . Back pain 04/16/2012   Past Medical History:  Diagnosis Date  . Arthritis   . Diabetes mellitus   . Hyperlipidemia     Family History  Problem Relation Age of Onset  . Diabetes Mother   . Heart disease Mother   . Diabetes Father   . Cancer Father 65       Throat  .  Diabetes Sister   . Diabetes Brother     Past Surgical History:  Procedure Laterality Date  . CESAREAN SECTION     Social History   Occupational History  . Not on file  Tobacco Use  . Smoking status: Passive Smoke Exposure - Never Smoker  . Smokeless tobacco: Never Used  Substance and Sexual Activity  . Alcohol use: No  . Drug use: No  . Sexual activity: Yes

## 2020-04-28 MED FILL — glipiZIDE 5 MG TABS: 5 | 30 days supply | Qty: 30 | Fill #1

## 2020-04-28 MED FILL — ATORVASTATIN 80 MG TABLET: 80 | 30 days supply | Qty: 30 | Fill #1

## 2020-04-29 MED FILL — METFORMIN HCL 500 MG TABS: 500 | 30 days supply | Qty: 120 | Fill #1

## 2020-04-29 MED FILL — AMLODIPINE BESYLATE 10 MG T: 10 | 90 days supply | Qty: 90 | Fill #2

## 2020-04-29 MED FILL — ONETOUCH DELICA PLUS LANCET: 33 days supply | Qty: 100 | Fill #1

## 2020-04-29 MED FILL — ONE TOUCH VERIO TEST STRIP: 33 days supply | Qty: 100 | Fill #1

## 2020-05-24 MED FILL — ONE TOUCH VERIO TEST STRIP: 33 days supply | Qty: 100 | Fill #1

## 2020-06-02 ENCOUNTER — Other Ambulatory Visit: Payer: Self-pay

## 2020-06-02 ENCOUNTER — Encounter: Payer: Self-pay | Admitting: Family Medicine

## 2020-06-02 ENCOUNTER — Other Ambulatory Visit: Payer: Self-pay | Admitting: Family Medicine

## 2020-06-02 ENCOUNTER — Ambulatory Visit: Payer: 59 | Attending: Family Medicine | Admitting: Family Medicine

## 2020-06-02 DIAGNOSIS — M791 Myalgia, unspecified site: Secondary | ICD-10-CM

## 2020-06-02 DIAGNOSIS — I10 Essential (primary) hypertension: Secondary | ICD-10-CM

## 2020-06-02 DIAGNOSIS — E119 Type 2 diabetes mellitus without complications: Secondary | ICD-10-CM

## 2020-06-02 DIAGNOSIS — E1169 Type 2 diabetes mellitus with other specified complication: Secondary | ICD-10-CM | POA: Diagnosis not present

## 2020-06-02 DIAGNOSIS — H539 Unspecified visual disturbance: Secondary | ICD-10-CM

## 2020-06-02 DIAGNOSIS — Z13228 Encounter for screening for other metabolic disorders: Secondary | ICD-10-CM

## 2020-06-02 DIAGNOSIS — E785 Hyperlipidemia, unspecified: Secondary | ICD-10-CM

## 2020-06-02 MED ORDER — ATORVASTATIN CALCIUM 80 MG PO TABS: 80.0000 mg | ORAL_TABLET | Freq: Every day | ORAL | 1 refills | Status: DC

## 2020-06-02 MED ORDER — METFORMIN HCL 500 MG PO TABS: 1000.0000 mg | ORAL_TABLET | Freq: Two times a day (BID) | ORAL | 1 refills | Status: DC

## 2020-06-02 MED ORDER — GLIPIZIDE 5 MG PO TABS: 5.0000 mg | ORAL_TABLET | Freq: Every day | ORAL | 1 refills | Status: DC

## 2020-06-02 MED ORDER — CYCLOBENZAPRINE HCL 10 MG PO TABS: 10.0000 mg | ORAL_TABLET | Freq: Every day | ORAL | 1 refills | Status: DC

## 2020-06-02 MED ORDER — AMLODIPINE BESYLATE 10 MG PO TABS: 10.0000 mg | ORAL_TABLET | Freq: Every day | ORAL | 1 refills | Status: DC

## 2020-06-02 MED FILL — glipiZIDE 5 MG TABS: 5 | 90 days supply | Qty: 90 | Fill #0

## 2020-06-02 MED FILL — ATORVASTATIN 80 MG TABLET: 80 | 90 days supply | Qty: 90 | Fill #0

## 2020-06-02 MED FILL — CYCLOBENZAPRINE 10 MG TAB: 10 | 30 days supply | Qty: 30 | Fill #0

## 2020-06-02 MED FILL — METFORMIN HCL 500 MG TABS: 500 | 90 days supply | Qty: 360 | Fill #0

## 2020-06-02 NOTE — Progress Notes (Signed)
Having pain in legs.  Having pain in left side.

## 2020-06-02 NOTE — Progress Notes (Signed)
Virtual Visit via Telephone Note  I connected with Rebecca [name], on 06/02/2020 at 8:35 AM by telephone due to the COVID-19 pandemic and verified that I am speaking with the correct person using two identifiers.   Consent: I discussed the limitations, risks, security and privacy concerns of performing an evaluation and management service by telephone and the availability of in person appointments. I also discussed with the patient that there may be a patient responsible charge related to this service. The patient expressed understanding and agreed to proceed.   Location of Patient: Home  Location of Provider: Clinic   Persons participating in Telemedicine visit: Tiare A Sadira Standard [id number] -Interpreter Dr. Margarita Rana     History of Present Illness: Rebecca [name] is a 54 year old female with a history of type 2 diabetes mellitus (A1c 7.9), hyperlipidemia, hypertension who presents today for follow-up visit. She complains of generalized body aches and would like to be checked for RA.  Complains of muscle spasm in her thighs with difficulty walking until after a few minutes after taking a few steps. Also noticed swelling of her ankle joints but has not had swelling of her PIP or DIP joints. Complains of difficulty reading and would like to see Ophthalmology  Endorses compliance with her antihypertensive, statin and diabetic medications.  Past Medical History:  Diagnosis Date  . Arthritis   . Diabetes mellitus   . Hyperlipidemia    No Known Allergies  Current Outpatient Medications on File Prior to Visit  Medication Sig Dispense Refill  . acetaminophen (TYLENOL) 500 MG tablet Take 1,000 mg by mouth daily as needed for mild pain. Reported on 01/16/2016    . amLODipine (NORVASC) 10 MG tablet Take 10 mg by mouth daily.    Marland Kitchen atorvastatin (LIPITOR) 80 MG tablet Take 1 tablet (80 mg total) by mouth daily. 90 tablet 1  . Blood Glucose Monitoring  Suppl (ONETOUCH VERIO REFLECT) w/Device KIT 1 kit by Does not apply route in the morning, at noon, and at bedtime. Use as directed to test blood sugar up to three times daily.E11.9 1 kit 0  . cetirizine (ZYRTEC) 10 MG tablet Take 1 tablet (10 mg total) by mouth daily. 30 tablet 0  . clotrimazole (CLOTRIMAZOLE AF) 1 % cream Apply 1 application topically 2 (two) times daily. 30 g 1  . cyclobenzaprine (FLEXERIL) 10 MG tablet Take 1 tablet (10 mg total) by mouth at bedtime. 30 tablet 1  . Dapsone 5 % topical gel APPLY ONE PEA SIZED AMOUNT TO FACE TWICE A DAY. 30 g 1  . glipiZIDE (GLUCOTROL) 5 MG tablet Take 1 tablet (5 mg total) by mouth daily before breakfast. 90 tablet 1  . glucose blood (ONETOUCH VERIO) test strip Use as directed to test blood sugar up to three times daily. E11.9 100 each 6  . ibuprofen (ADVIL,MOTRIN) 800 MG tablet Take 1 tablet (800 mg total) by mouth 2 (two) times daily as needed. 60 tablet 1  . Lancets (ONETOUCH DELICA PLUS [id number]) MISC 1 Device by Does not apply route in the morning, at noon, and at bedtime. Use as directed to test blood sugar up to three times daily. E11.9 100 each 6  . lidocaine (XYLOCAINE) 5 % ointment Apply 1 application topically daily as needed. Apply to affected areas. 30 g 0  . naproxen (NAPROSYN) 500 MG tablet Take 1 tablet (500 mg total) by mouth 2 (two) times daily with a meal. 30 tablet 1  . omeprazole (PRILOSEC) 20  MG capsule Take 1 capsule (20 mg total) by mouth daily. 30 capsule 0  . propranolol (INDERAL) 10 MG tablet Take 1 tablet (10 mg total) by mouth 3 (three) times daily as needed. 15 tablet 0  . benzonatate (TESSALON) 100 MG capsule Take 1 capsule (100 mg total) by mouth every 8 (eight) hours. (Patient not taking: Reported on 06/02/2020) 21 capsule 0  . clindamycin-benzoyl peroxide (BENZACLIN) gel Apply topically 2 (two) times daily. (Patient not taking: Reported on 06/02/2020) 25 g 1  . metFORMIN (GLUCOPHAGE) 500 MG tablet Take 2 tablets  (1,000 mg total) by mouth 2 (two) times daily with a meal. (Patient not taking: Reported on 06/02/2020) 360 tablet 1  . predniSONE (DELTASONE) 50 MG tablet Take one tablet by mouth once daily for 5 days. (Patient not taking: Reported on 06/02/2020) 5 tablet 0   No current facility-administered medications on file prior to visit.    Observations/Objective: Awake, alert, oriented x3 Not in acute distress   CMP Latest Ref Rng & Units 11/04/2019 07/29/2019 07/12/2019  Glucose 65 - 99 mg/dL 115(H) 153(H) 145(H)  BUN 6 - 24 mg/dL _0 Creatinine 0.57 - 1.00 mg/dL 0.53(L) 0.53 0.46  Sodium 134 - 144 mmol/L 137 137 136  Potassium 3.5 - 5.2 mmol/L 4.3 5.7(H) 4.5  Chloride 96 - 106 mmol/L 97 101 99  CO2 20 - 29 mmol/L _1 Calcium 8.7 - 10.2 mg/dL 9.5 9.2 9.3  Total Protein 6.0 - 8.5 g/dL 7.1 7.2 7.4  Total Bilirubin 0.0 - 1.2 mg/dL 0.5 2.0(H) 1.7(H)  Alkaline Phos 39 - 117 IU/L 76 68 65  AST 0 - 40 IU/L 19 68(H) 51(H)  ALT 0 - 32 IU/L _2 Lipid Panel     Component Value Date/Time   CHOL 89 (L) 11/04/2019 1043   TRIG 132 11/04/2019 1043   HDL 43 11/04/2019 1043   CHOLHDL 2.1 11/04/2019 1043   CHOLHDL 5.5 (H) 11/02/2016 1102   VLDL 80 (H) 11/02/2016 1102   LDLCALC 23 11/04/2019 1043   LABVLDL 23 11/04/2019 1043    Lab Results  Component Value Date   HGBA1C 7.9 (A) 11/04/2019    Assessment and Plan: 1. Hyperlipidemia associated with type 2 diabetes mellitus (HCC) Controlled Low-cholesterol diet - CMP14+EGFR - atorvastatin (LIPITOR) 80 MG tablet; Take 1 tablet (80 mg total) by mouth daily.  Dispense: 90 tablet; Refill: 1  2. Type 2 diabetes mellitus without complication, without long-term current use of insulin (HCC) Not at goal with A1c of 7.9 We will check A1c and adjust regimen accordingly - Microalbumin / creatinine urine ratio - Hemoglobin A1c - glipiZIDE (GLUCOTROL) 5 MG tablet; Take 1 tablet (5 mg total) by mouth daily before breakfast.  Dispense: 90  tablet; Refill: 1 - metFORMIN (GLUCOPHAGE) 500 MG tablet; Take 2 tablets (1,000 mg total) by mouth 2 (two) times daily with a meal.  Dispense: 360 tablet; Refill: 1  3. Myalgia Unknown etiology Continue Flexeril We will check for autoimmune disorder - ANA,IFA RA Diag Pnl w/rflx Tit/Patn - Sedimentation Rate - C-reactive protein - CYCLIC CITRUL PEPTIDE ANTIBODY, IGG/IGA  4. Essential hypertension Stable Continue amlodipine Counseled on blood pressure goal of less than 130/80, low-sodium, DASH diet, medication compliance, 150 minutes of moderate intensity exercise per week. Discussed medication compliance, adverse effects.   5. Screening for metabolic disorder - VITAMIN D 25 Hydroxy (Vit-D Deficiency, Fractures)  6. Abnormal vision - Ambulatory referral to Ophthalmology  Follow Up Instructions: 3 months for chronic disease management   I discussed the assessment and treatment plan with the patient. The patient was provided an opportunity to ask questions and all were answered. The patient agreed with the plan and demonstrated an understanding of the instructions.   The patient was advised to call back or seek an in-person evaluation if the symptoms worsen or if the condition fails to improve as anticipated.     I provided 18 minutes total of non-face-to-face time during this encounter including median intraservice time, reviewing previous notes, investigations, ordering medications, medical decision making, coordinating care and patient verbalized understanding at the end of the visit.     [name], MD, FAAFP. [facility] and [place], [place]   06/02/2020, 8:35 AM

## 2020-06-06 ENCOUNTER — Other Ambulatory Visit: Payer: Self-pay | Admitting: Family Medicine

## 2020-06-06 MED ORDER — ERGOCALCIFEROL 1.25 MG (50000 UT) PO CAPS: 50000.0000 [IU] | ORAL_CAPSULE | ORAL | 0 refills | Status: DC

## 2020-06-06 MED ORDER — DAPAGLIFLOZIN PROPANEDIOL 5 MG PO TABS
5.0000 mg | ORAL_TABLET | Freq: Every day | ORAL | 3 refills | Status: DC
Start: 2020-06-06 — End: 2020-06-07

## 2020-06-07 ENCOUNTER — Telehealth: Payer: Self-pay

## 2020-06-07 ENCOUNTER — Other Ambulatory Visit: Payer: Self-pay | Admitting: Family Medicine

## 2020-06-07 LAB — CMP14+EGFR
ALT: 22 IU/L (ref 0–32)
AST: 24 IU/L (ref 0–40)
Albumin/Globulin Ratio: 1.7 (ref 1.2–2.2)
Albumin: 4.8 g/dL (ref 3.8–4.9)
Alkaline Phosphatase: 92 IU/L (ref 48–121)
BUN/Creatinine Ratio: 21 (ref 9–23)
BUN: 14 mg/dL (ref 6–24)
Bilirubin Total: 0.3 mg/dL (ref 0.0–1.2)
CO2: 17 mmol/L — ABNORMAL LOW (ref 20–29)
Calcium: 9.4 mg/dL (ref 8.7–10.2)
Chloride: 100 mmol/L (ref 96–106)
Creatinine, Ser: 0.67 mg/dL (ref 0.57–1.00)
GFR calc Af Amer: 118 mL/min/1.73
GFR calc non Af Amer: 102 mL/min/1.73
Globulin, Total: 2.8 g/dL (ref 1.5–4.5)
Glucose: 159 mg/dL — ABNORMAL HIGH (ref 65–99)
Potassium: 4.3 mmol/L (ref 3.5–5.2)
Sodium: 137 mmol/L (ref 134–144)
Total Protein: 7.6 g/dL (ref 6.0–8.5)

## 2020-06-07 LAB — MICROALBUMIN / CREATININE URINE RATIO
Creatinine, Urine: 71 mg/dL
Microalb/Creat Ratio: 919 mg/g creat — ABNORMAL HIGH (ref 0–29)
Microalbumin, Urine: 652.3 ug/mL

## 2020-06-07 LAB — HEMOGLOBIN A1C
Est. average glucose Bld gHb Est-mCnc: 171 mg/dL
Hgb A1c MFr Bld: 7.6 % — ABNORMAL HIGH (ref 4.8–5.6)

## 2020-06-07 LAB — SEDIMENTATION RATE: Sed Rate: 16 mm/hr (ref 0–40)

## 2020-06-07 LAB — VITAMIN D 25 HYDROXY (VIT D DEFICIENCY, FRACTURES): Vit D, 25-Hydroxy: 15.5 ng/mL — ABNORMAL LOW (ref 30.0–100.0)

## 2020-06-07 LAB — C-REACTIVE PROTEIN: CRP: 9 mg/L (ref 0–10)

## 2020-06-07 LAB — ANA,IFA RA DIAG PNL W/RFLX TIT/PATN
ANA Titer 1: NEGATIVE
Cyclic Citrullin Peptide Ab: 5 units (ref 0–19)
Rheumatoid fact SerPl-aCnc: <10 IU/mL (ref 0.0–13.9)

## 2020-06-07 MED ORDER — CANAGLIFLOZIN 100 MG PO TABS
100.0000 mg | ORAL_TABLET | Freq: Every day | ORAL | 3 refills | Status: DC
Start: 2020-06-07 — End: 2020-10-19

## 2020-06-07 MED FILL — VIT D2 1.25 MG (50,000 UNIT: 1.25 MG | 84 days supply | Qty: 12 | Fill #0

## 2020-06-07 MED FILL — INVOKANA 100 MG TABLET: 100 | 30 days supply | Qty: 30 | Fill #0

## 2020-06-07 NOTE — Telephone Encounter (Signed)
Pt requested copy of 06/02/20 lab results. Printed & given to pt.

## 2020-06-07 NOTE — Telephone Encounter (Signed)
Pt has a new question. Please call with interpreter. Pt ph [phone].

## 2020-06-07 NOTE — Telephone Encounter (Signed)
Insurance will not cover [employer] prefers [place] or [place].  Send change to [facility].

## 2020-06-08 NOTE — Telephone Encounter (Signed)
Patient was set up with pharmacist luke for medication review.

## 2020-06-08 NOTE — Telephone Encounter (Signed)
Pt states does not understand why she has 3 diabetes meds.  The new one called in yestrday [facility] & the pt already takes metformin & glipizde. Please advise.

## 2020-06-09 NOTE — Progress Notes (Signed)
Patient was informed of lab results in office.

## 2020-06-13 NOTE — Progress Notes (Signed)
° °  S:  PCP: [facility]  No chief complaint on file.  Patient presents for medication management for diabetes. Patient was last seen by PCP on 06/02/20, at that time A1c above goal and started Invokana.  Patient reports she was started on Invokana to help her kidneys. Felt her body was shaky after she took the first dose, her BG was ~80s, but symptom has not reoccured. She has continued to take Invokana but does not understand what it is for.  Denies any current or recent vaginal yeast infection sympoms. Endorses continued myalgias which was discussed with Dr. Margarita Rana at last visit.  Patient expresses concern with protein in urine. States she spoke with friend who is a kidney doctor and patient feels strongly she wants to be on a medication additional to [facility] for her kidneys. Pt reports history of swelling in throat with lisinopril in past and is interested in trying something else (added details to allergies, did not report to ED for this)  Pertinent PMH: history of recurrent candida vaginitis  Current diabetes medications:  1. Metformin 500 mg BID 2. Glipizide 5 mg daily before breakfast  3. Invokana 100 mg daily before breakfast  Adverse effects: denies  Prior diabetes med trials: Farxiga  Hypoglycemia: - Denies any BG < 70  Lifestyle: Reports fear of eating high protein due to recent proteinuria.  Current hypertension medications include: amlodipine 10 mg daily Current hyperlipidemia medications include: atorvastatin 80 mg daily  O:  Lab Results  Component Value Date   HGBA1C 7.6 (H) 06/02/2020   There were no vitals filed for this visit.  Home BG Readings FBG: 119 yesterday, denies any fasting BG > 130  Home BP Readings 125/58, 119/80  MCR: 919 (06/02/20)  Lipid Panel     Component Value Date/Time   CHOL 89 (L) 11/04/2019 1043   TRIG 132 11/04/2019 1043   HDL 43 11/04/2019 1043   CHOLHDL 2.1 11/04/2019 1043   CHOLHDL 5.5 (H) 11/02/2016 1102   VLDL 80 (H)  11/02/2016 1102   LDLCALC 23 11/04/2019 1043    Clinical Atherosclerotic Cardiovascular Disease (ASCVD): No  The ASCVD Risk score Mikey Bussing DC Jr., et al., 2013) failed to calculate for the following reasons:   The valid total cholesterol range is 130 to 320 mg/dL    A: 1. Diabetes - Most recent A1c close to goal < 7%, next due 08/2020 - Tolerating current medications with no hypoglycemia or adverse effects, home BG readings at goal - Significantly elevated MCR, not on ACEI/ARB. Would benefit from ARB given history of possible angioedema with ACEI. Clinic BP close to goal with no reports of hypotension. - Discussed s/sx angioedema, instructions to go to ED immediately if reoccurrs despite low risk with ARB - Discussed indication for SGLT2, mechanism of diabetic kidney disease, adverse effects - Educated on Rule of 15s  P: - Will consult Dr. Margarita Rana about starting ARB for elevated MCR - Continue metformin 500 mg BID, glipizide 5 mg daily, and Invokana 100 mg daily - Continue checking BG and BP daily - Referral to nutritionist per pt request - F/U Pharmacist Clinic Visit in 1 week   Total time in face to face counseling 45 minutes.    Fara Olden, PharmD PGY-1 Ambulatory Care Pharmacy Resident 06/14/2020 11:31 AM

## 2020-06-14 ENCOUNTER — Other Ambulatory Visit: Payer: Self-pay

## 2020-06-14 ENCOUNTER — Ambulatory Visit: Payer: 59 | Attending: Family Medicine | Admitting: Pharmacist

## 2020-06-14 ENCOUNTER — Encounter: Payer: Self-pay | Admitting: Pharmacist

## 2020-06-14 VITALS — BP 128/86 | HR 80

## 2020-06-14 DIAGNOSIS — E119 Type 2 diabetes mellitus without complications: Secondary | ICD-10-CM

## 2020-06-21 NOTE — Progress Notes (Signed)
    S:    PCP: [facility]  PMH: DM, HLD, HTN, Arthritis  Patient presents for medication management for diabetes. Patient was last seen by PCP on 06/02/20. She was last seen by the clinical pharmacist on 06/14/20 and reported concerns about her kidney function. Dr. Margarita Rana was consulted and confirmed she is not a candidate for an ARB due to Hx of angioedema with lisinopril. All DM medications were continued.   Today, patient presents in good spirits for a 1-week DM follow-up. Reports medication adherence with metformin, glipizide, and Invokana. No reports of significant side effects. Does endorse 1 episode of relative hypoglycemia. Reports "not feeling good" when BG was 72. She tx'd this and sugar increased to 175. Denies any sugars < 70. Reports sugar prior to visit was 101.   Family/Social History: -Fhx: Diabetes (mother, father, sister, brother); heart disease (mother) -Tobacco use: denies  Insurance coverage/medication affordability: [employer]  Medication adherence reported.  Current diabetes medications include:  1. Metformin 500 mg BID  2. Glipizide 5 mg daily (AM) 3. Invokana 100 mg daily (AM)  Prior diabetes medications include: Wilder Glade (not on formulary) Current hypertension medications include: amlodipine 10 mg daily Current hyperlipidemia medications include: atorvastatin 80 mg daily  Patient denies hypoglycemic events.  Patient reported dietary habits: Reports fear of eating high protein due to recent proteinuria. Limits carbs, limits read meat, eats    Patient denies nocturia (nighttime urination).  Patient denies neuropathy (nerve pain). Patient denies visual changes.   O:  POCT: 133 (fasting)  Lab Results  Component Value Date   HGBA1C 7.6 (H) 06/02/2020   Vitals:   06/22/20 0855  BP: 124/80  Pulse: 91    Lipid Panel     Component Value Date/Time   CHOL 89 (L) 11/04/2019 1043   TRIG 132 11/04/2019 1043   HDL 43 11/04/2019 1043   CHOLHDL 2.1 11/04/2019  1043   CHOLHDL 5.5 (H) 11/02/2016 1102   VLDL 80 (H) 11/02/2016 1102   LDLCALC 23 11/04/2019 1043    Home fasting blood sugars: 101  Clinical Atherosclerotic Cardiovascular Disease (ASCVD): No  The ASCVD Risk score Mikey Bussing DC Jr., et al., 2013) failed to calculate for the following reasons:   The valid total cholesterol range is 130 to 320 mg/dL   A/P: Diabetes longstanding currently uncontrolled with A1C of 7.6%. Clinic POCT and home sugars are at goal and medication adherence appears optimal. Discussed the risk of an allergic reaction with starting an ARB due to history of angioedema with lisinopril and also discussed the benefits of Invokana. Patient verbalized understanding. Encouraged patient to continue checking blood sugars at home and to continue eating a healthy diet. Patient is able to verbalize appropriate hypoglycemia management plan.  -Continue glipizide 5 mg daily -Continue Invokana 300 mg daily -Continue metformin 500 mg twice daily  -Extensively discussed pathophysiology of diabetes, recommended lifestyle interventions, dietary effects on blood sugar control -Counseled on s/sx of and management of hypoglycemia -Next A1C anticipated November 2021.   Written patient instructions provided.  Total time in face to face counseling 30 minutes.   Follow up with PCP at next visit.   Lorel Monaco, PharmD PGY2 Ambulatory Care Resident [place]

## 2020-06-22 ENCOUNTER — Encounter: Payer: Self-pay | Admitting: Pharmacist

## 2020-06-22 ENCOUNTER — Other Ambulatory Visit: Payer: Self-pay

## 2020-06-22 ENCOUNTER — Ambulatory Visit: Payer: 59 | Attending: Family Medicine | Admitting: Pharmacist

## 2020-06-22 VITALS — BP 124/80 | HR 91

## 2020-06-22 DIAGNOSIS — E119 Type 2 diabetes mellitus without complications: Secondary | ICD-10-CM

## 2020-06-22 LAB — GLUCOSE, POCT (MANUAL RESULT ENTRY): POC Glucose: 133 mg/dl — AB (ref 70–99)

## 2020-07-06 MED FILL — INVOKANA 100 MG TABLET: 100 | 30 days supply | Qty: 30 | Fill #1

## 2020-08-05 MED FILL — AMLODIPINE BESYLATE 10 MG T: 10 | 90 days supply | Qty: 90 | Fill #0

## 2020-08-09 MED FILL — INVOKANA 100 MG TABLET: 100 | 30 days supply | Qty: 30 | Fill #2

## 2020-08-25 ENCOUNTER — Other Ambulatory Visit: Payer: Self-pay | Admitting: Family

## 2020-09-13 ENCOUNTER — Telehealth: Payer: Self-pay

## 2020-09-13 MED FILL — ATORVASTATIN CALCIUM 80 MG: 80 | 90 days supply | Qty: 90 | Fill #1

## 2020-09-13 MED FILL — INVOKANA 100 MG TABLET: 100 | 30 days supply | Qty: 30 | Fill #3

## 2020-09-13 MED FILL — METFORMIN HCL 500 MG TABS: 500 | 90 days supply | Qty: 360 | Fill #1

## 2020-09-13 MED FILL — glipiZIDE 5 MG TABS: 5 | 90 days supply | Qty: 90 | Fill #1

## 2020-09-13 NOTE — Telephone Encounter (Signed)
Patient dropped off paperwork for the office.

## 2020-09-13 NOTE — Telephone Encounter (Signed)
Form has been received and will be filled out and pt will be called once ready for pick up.

## 2020-09-18 ENCOUNTER — Ambulatory Visit (HOSPITAL_COMMUNITY)
Admission: EM | Admit: 2020-09-18 | Discharge: 2020-09-18 | Disposition: A | Discharge status: Home / Self Care | Payer: 59 | Attending: Family Medicine | Admitting: Family Medicine

## 2020-09-18 ENCOUNTER — Encounter (HOSPITAL_COMMUNITY): Payer: Self-pay | Admitting: Family Medicine

## 2020-09-18 ENCOUNTER — Other Ambulatory Visit: Payer: Self-pay

## 2020-09-18 ENCOUNTER — Ambulatory Visit (INDEPENDENT_AMBULATORY_CARE_PROVIDER_SITE_OTHER): Payer: 59

## 2020-09-18 DIAGNOSIS — R109 Unspecified abdominal pain: Secondary | ICD-10-CM | POA: Diagnosis not present

## 2020-09-18 DIAGNOSIS — R140 Abdominal distension (gaseous): Secondary | ICD-10-CM | POA: Diagnosis present

## 2020-09-18 LAB — COMPREHENSIVE METABOLIC PANEL
ALT: 29 U/L (ref 0–44)
AST: 29 U/L (ref 15–41)
Albumin: 4 g/dL (ref 3.5–5.0)
Alkaline Phosphatase: 70 U/L (ref 38–126)
Anion gap: 10 (ref 5–15)
BUN: 9 mg/dL (ref 6–20)
CO2: 28 mmol/L (ref 22–32)
Calcium: 9.2 mg/dL (ref 8.9–10.3)
Chloride: 99 mmol/L (ref 98–111)
Creatinine, Ser: 0.56 mg/dL (ref 0.44–1.00)
GFR, Estimated: >60 mL/min (ref >60)
Glucose, Bld: 222 mg/dL — ABNORMAL HIGH (ref 70–99)
Potassium: 3.9 mmol/L (ref 3.5–5.1)
Sodium: 137 mmol/L (ref 135–145)
Total Bilirubin: 0.6 mg/dL (ref 0.3–1.2)
Total Protein: 7.3 g/dL (ref 6.5–8.1)

## 2020-09-18 LAB — POCT URINALYSIS DIPSTICK, ED / UC
Bilirubin Urine: NEGATIVE
Glucose, UA: 100 mg/dL — AB
Ketones, ur: NEGATIVE mg/dL
Leukocytes,Ua: NEGATIVE
Nitrite: NEGATIVE
Protein, ur: NEGATIVE mg/dL
Specific Gravity, Urine: 1.025 (ref 1.005–1.030)
Urobilinogen, UA: 0.2 mg/dL (ref 0.0–1.0)
pH: 5.5 (ref 5.0–8.0)

## 2020-09-18 LAB — CBG MONITORING, ED: Glucose-Capillary: 214 mg/dL — ABNORMAL HIGH (ref 70–99)

## 2020-09-18 LAB — POC URINE PREG, ED: Preg Test, Ur: NEGATIVE

## 2020-09-18 IMAGING — DX DG ABDOMEN 1V
1 series · 1 of 1 positions shown · non-contrast
Comparison: [DATE]

CLINICAL DATA: Abdominal pain and bloating

EXAM:
ABDOMEN - 1 VIEW

[abdomen kub]
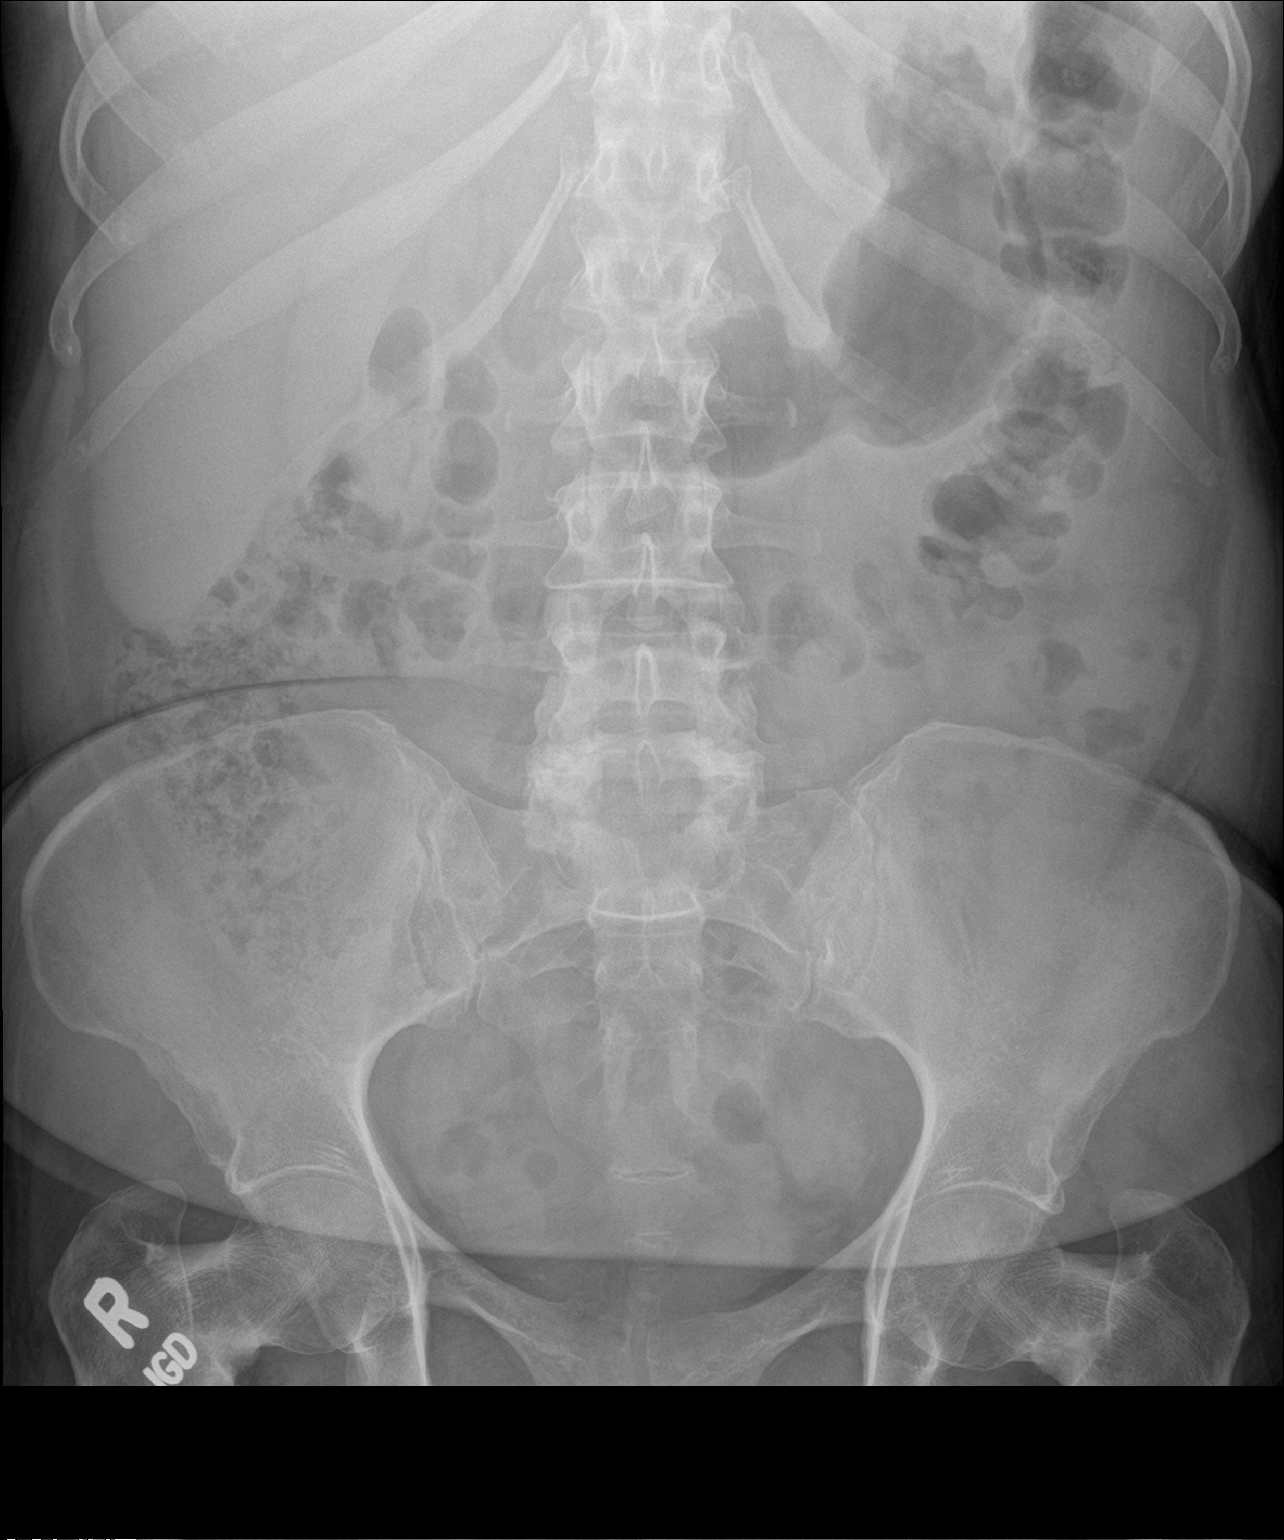

[1 of 1 positions shown; findings below may reference images not displayed]

FINDINGS: There is moderate stool in the colon. There is no bowel dilatation
or air-fluid level to suggest bowel obstruction. No free air.
Probable phleboliths in the lower left pelvis.
IMPRESSION: Moderate stool in colon. No bowel obstruction or free air evident on
supine examination.

## 2020-09-18 MED ORDER — POLYETHYLENE GLYCOL 3350 17 G PO PACK
17.0000 g | PACK | Freq: Every day | ORAL | 0 refills | Status: DC
Start: 2020-09-18 — End: 2020-11-07

## 2020-09-18 NOTE — ED Triage Notes (Addendum)
Pt present abdominal swelling on the RUQ area. Pt states she noticed the swelling last night. Pt denies any other symptoms.

## 2020-09-18 NOTE — ED Provider Notes (Signed)
[place]    CSN: [id number] Arrival date & time: 09/18/20  1020      History   Chief Complaint Chief Complaint  Patient presents with  . Abdominal Pain    swelling    HPI Rebecca [name] is a 54 y.o. female.   This is a 54 year old established: Urgent care patient who presents with abdominal pain and swelling.  Her problem list is positive for type II diabetes.  She is taking medication for hypertension, hyperlipidemia, and arthritis as well as reflux.    She took a course of prednisone in August.  LMP was light, last week.  Patient says she is eating more this past month.  Patient developed some swelling on right side without actual pain yesterday.  No difficulty with bowels or bladder.  No fever, cramps.     Past Medical History:  Diagnosis Date  . Arthritis   . Diabetes mellitus   . Hyperlipidemia     Patient Active Problem List   Diagnosis Date Noted  . Posterior tibial tendinitis, right leg 01/26/2020    03/11/2017    08/06/2013    08/06/2013    08/19/2012    08/19/2012    04/16/2012  . Diabetes mellitus type 2, controlled ([place]) 04/16/2012  . Hyperlipidemia 04/16/2012    04/16/2012    04/16/2012    Past Surgical History:  Procedure Laterality Date  . CESAREAN SECTION      OB History   No obstetric history on file.      Home Medications    Prior to Admission medications   Medication Sig Start Date End Date Taking? Authorizing Provider  acetaminophen (TYLENOL) 500 MG tablet Take 1,000 mg by mouth daily as needed for mild pain. Reported on 01/16/2016    [provider]  amLODipine (NORVASC) 10 MG tablet Take 1 tablet (10 mg total) by mouth daily. 06/02/20   [name], MD  atorvastatin (LIPITOR) 80 MG tablet Take 1 tablet (80 mg total) by mouth daily. 06/02/20   [name], MD  benzonatate (TESSALON) 100 MG capsule Take 1 capsule (100 mg total) by mouth every 8 (eight) hours. Patient not taking: Reported on  06/02/2020 11/21/19   Augusto Gamble B, NP  Blood Glucose Monitoring Suppl (ONETOUCH VERIO REFLECT) w/Device KIT 1 kit by Does not apply route in the morning, at noon, and at bedtime. Use as directed to test blood sugar up to three times daily.E11.9 02/02/20   [name], MD  canagliflozin (INVOKANA) 100 MG TABS tablet Take 1 tablet (100 mg total) by mouth daily before breakfast. 06/07/20   [name], MD  cetirizine (ZYRTEC) 10 MG tablet Take 1 tablet (10 mg total) by mouth daily. 11/21/19   Zigmund Gottron, NP  clindamycin-benzoyl peroxide (BENZACLIN) gel Apply topically 2 (two) times daily. Patient not taking: Reported on 06/02/2020 06/12/18   [name], MD  clotrimazole (CLOTRIMAZOLE AF) 1 % cream Apply 1 application topically 2 (two) times daily. 11/21/19   Zigmund Gottron, NP  cyclobenzaprine (FLEXERIL) 10 MG tablet TAKE 1 TABLET(10 MG) BY MOUTH AT BEDTIME 08/26/20   Dondra Prader R, NP  Dapsone 5 % topical gel APPLY ONE PEA SIZED AMOUNT TO FACE TWICE A DAY. 06/26/17   Fredia Beets R, FNP  ergocalciferol (DRISDOL) 1.25 MG (50000 UT) capsule Take 1 capsule (50,000 Units total) by mouth once a week. 06/06/20   [name], MD  glipiZIDE (GLUCOTROL) 5 MG tablet Take 1 tablet (5 mg total) by mouth  daily before breakfast. 06/02/20   [name], MD  glucose blood (ONETOUCH VERIO) test strip Use as directed to test blood sugar up to three times daily. E11.9 02/02/20   [name], MD  ibuprofen (ADVIL,MOTRIN) 800 MG tablet Take 1 tablet (800 mg total) by mouth 2 (two) times daily as needed. 07/10/18   Argentina Donovan, PA-C  Lancets (ONETOUCH DELICA PLUS [id number]) MISC 1 Device by Does not apply route in the morning, at noon, and at bedtime. Use as directed to test blood sugar up to three times daily. E11.9 02/02/20   [name], MD  lidocaine (XYLOCAINE) 5 % ointment Apply 1 application topically daily as needed. Apply to affected areas. 09/25/17   Alfonse Spruce, FNP    metFORMIN (GLUCOPHAGE) 500 MG tablet Take 2 tablets (1,000 mg total) by mouth 2 (two) times daily with a meal. 06/02/20   [name], MD  naproxen (NAPROSYN) 500 MG tablet Take 1 tablet (500 mg total) by mouth 2 (two) times daily with a meal. 02/09/20   Suzan Slick, NP  omeprazole (PRILOSEC) 20 MG capsule Take 1 capsule (20 mg total) by mouth daily. 11/21/19   Zigmund Gottron, NP  polyethylene glycol (MIRALAX MIX-IN PAX) 17 g packet Take 17 g by mouth daily. 09/18/20   Robyn Haber, MD  predniSONE (DELTASONE) 50 MG tablet Take one tablet by mouth once daily for 5 days. Patient not taking: Reported on 06/02/2020 03/11/20   Suzan Slick, NP  propranolol (INDERAL) 10 MG tablet Take 1 tablet (10 mg total) by mouth 3 (three) times daily as needed. 07/29/19   Drenda Freeze, MD    Family History Family History  Problem Relation Age of Onset  . Diabetes Mother   . Heart disease Mother   . Diabetes Father   . Cancer Father 71       Throat  . Diabetes Sister   . Diabetes Brother     Social History Social History   Tobacco Use  . Smoking status: Passive Smoke Exposure - Never Smoker  . Smokeless tobacco: Never Used  Substance Use Topics  . Alcohol use: No  . Drug use: No     Allergies   Lisinopril   Review of Systems Review of Systems  Constitutional: Positive for appetite change. Negative for activity change and fever.  HENT: Negative.   Respiratory: Negative.   Cardiovascular: Negative.   Gastrointestinal: Positive for abdominal distention and abdominal pain. Negative for constipation, diarrhea, nausea and vomiting.  Genitourinary: Negative.   Musculoskeletal: Negative.   Skin: Negative.      Physical Exam Triage Vital Signs ED Triage Vitals  Enc Vitals Group     BP      Pulse      Resp      Temp      Temp src      SpO2      Weight      Height      Head Circumference      Peak Flow      Pain Score      Pain Loc      Pain Edu?      Excl. in [place]?     No data found.  Updated Vital Signs BP (!) 145/77 (BP Location: Left Arm)   Pulse 76   Temp 98.3 F (36.8 C) (Oral)   Resp 16   SpO2 100%    Physical Exam Vitals and nursing note reviewed.  Constitutional:  General: She is not in acute distress.    Appearance: She is well-developed. She is obese. She is not ill-appearing.  HENT:     Head: Normocephalic.     Mouth/Throat:     Mouth: Mucous membranes are moist.  Eyes:     Extraocular Movements: Extraocular movements intact.  Cardiovascular:     Heart sounds: Normal heart sounds.  Pulmonary:     Effort: Pulmonary effort is normal.     Breath sounds: Normal breath sounds.  Abdominal:     General: Abdomen is protuberant. Bowel sounds are normal.     Palpations: Abdomen is soft.     Tenderness: There is no abdominal tenderness.     Comments: Fullness right upper quadrant  Skin:    General: Skin is warm and dry.  Neurological:     General: No focal deficit present.     Mental Status: She is alert.  Psychiatric:        Mood and Affect: Mood normal.        Behavior: Behavior normal.      UC Treatments / Results  Labs (all labs ordered are listed, but only abnormal results are displayed) Labs Reviewed  CBG MONITORING, ED - Abnormal; Notable for the following components:      Result Value   Glucose-Capillary 214 (*)    All other components within normal limits  POCT URINALYSIS DIPSTICK, ED / UC - Abnormal; Notable for the following components:   Glucose, UA 100 (*)    Hgb urine dipstick TRACE (*)    All other components within normal limits  COMPREHENSIVE METABOLIC PANEL  POC URINE PREG, ED    EKG   Radiology No results found.  Procedures Procedures (including critical care time)  Medications Ordered in UC Medications - No data to display  Initial Impression / Assessment and Plan / UC Course  I have reviewed the triage vital signs and the nursing notes.  Pertinent labs & imaging results that  were available during my care of the patient were reviewed by me and considered in my medical decision making (see chart for details).    Final Clinical Impressions(s) / UC Diagnoses   Final diagnoses:  Abdominal bloating     Discharge Instructions     Your blood sugar is only mildly elevated at 214.  Urine test is negative.  The x-ray shows some undigested food on the right side.  We are prescribing medicine to move that food along and reduce the swelling there.    ED Prescriptions    Medication Sig Dispense Auth. Provider   polyethylene glycol (MIRALAX MIX-IN PAX) 17 g packet Take 17 g by mouth daily. 14 each Robyn Haber, MD     I have reviewed the PDMP during this encounter.   Robyn Haber, MD 09/18/20 1200

## 2020-09-18 NOTE — Discharge Instructions (Addendum)
Your blood sugar is only mildly elevated at 214.  Urine test is negative.  The x-ray shows some undigested food on the right side.  We are prescribing medicine to move that food along and reduce the swelling there.

## 2020-10-06 ENCOUNTER — Ambulatory Visit: Payer: 59 | Admitting: Physician Assistant

## 2020-10-06 ENCOUNTER — Other Ambulatory Visit: Payer: Self-pay

## 2020-10-06 VITALS — BP 131/84 | HR 80 | Temp 98.2°F | Resp 18 | Ht 63.0 in | Wt 143.0 lb

## 2020-10-06 DIAGNOSIS — Z1211 Encounter for screening for malignant neoplasm of colon: Secondary | ICD-10-CM | POA: Diagnosis not present

## 2020-10-06 DIAGNOSIS — E1121 Type 2 diabetes mellitus with diabetic nephropathy: Secondary | ICD-10-CM | POA: Diagnosis not present

## 2020-10-06 DIAGNOSIS — E7849 Other hyperlipidemia: Secondary | ICD-10-CM

## 2020-10-06 NOTE — Progress Notes (Addendum)
Established Patient Office Visit  Subjective:  Patient ID: Rebecca [name], female    DOB: 1966-02-12  Age: 54 y.o. MRN: [id number]  CC:  Chief Complaint  Patient presents with  . Diabetes    HPI Rebecca [name] reports that she needs to have a form filled out for her health insurance showing that she is up-to-date on all of her health maintenance.  Reports that she is treated for diabetes type 2, and hyperlipidemia, as well as hypertension.  Reports that she is compliant with her medications this visits.  Reports 2 doses of Pfizer Covid vaccine with last dose at the end of October 2021.  Due to language barrier, an interpreter was present during the history-taking and subsequent discussion (and for part of the physical exam) with this patient.   No new complaints at this time    Past Medical History:  Diagnosis Date  . Arthritis   . Diabetes mellitus   . Hyperlipidemia     Past Surgical History:  Procedure Laterality Date  . CESAREAN SECTION      Family History  Problem Relation Age of Onset  . Diabetes Mother   . Heart disease Mother   . Diabetes Father   . Cancer Father 34       Throat  . Diabetes Sister   . Diabetes Brother     Social History   Socioeconomic History  . Marital status: Married    Spouse name: Not on file  . Number of children: Not on file  . Years of education: Not on file  . Highest education level: Not on file  Occupational History  . Not on file  Tobacco Use  . Smoking status: Passive Smoke Exposure - Never Smoker  . Smokeless tobacco: Never Used  Substance and Sexual Activity  . Alcohol use: No  . Drug use: No  . Sexual activity: Yes  Other Topics Concern  . Not on file  Social History Narrative  . Not on file   Social Determinants of Health   Financial Resource Strain: Not on file  Food Insecurity: Not on file  Transportation Needs: Not on file  Physical Activity: Not on file  Stress: Not on file  Social  Connections: Not on file  Intimate Partner Violence: Not on file    Outpatient Medications Prior to Visit  Medication Sig Dispense Refill  . acetaminophen (TYLENOL) 500 MG tablet Take 1,000 mg by mouth daily as needed for mild pain. Reported on 01/16/2016    . amLODipine (NORVASC) 10 MG tablet Take 1 tablet (10 mg total) by mouth daily. 90 tablet 1  . atorvastatin (LIPITOR) 80 MG tablet Take 1 tablet (80 mg total) by mouth daily. 90 tablet 1  . Blood Glucose Monitoring Suppl (ONETOUCH VERIO REFLECT) w/Device KIT 1 kit by Does not apply route in the morning, at noon, and at bedtime. Use as directed to test blood sugar up to three times daily.E11.9 1 kit 0  . canagliflozin (INVOKANA) 100 MG TABS tablet Take 1 tablet (100 mg total) by mouth daily before breakfast. 30 tablet 3  . cetirizine (ZYRTEC) 10 MG tablet Take 1 tablet (10 mg total) by mouth daily. 30 tablet 0  . clotrimazole (CLOTRIMAZOLE AF) 1 % cream Apply 1 application topically 2 (two) times daily. 30 g 1  . cyclobenzaprine (FLEXERIL) 10 MG tablet TAKE 1 TABLET(10 MG) BY MOUTH AT BEDTIME 30 tablet 1  . Dapsone 5 % topical gel APPLY ONE PEA SIZED AMOUNT  TO FACE TWICE A DAY. 30 g 1  . ergocalciferol (DRISDOL) 1.25 MG (50000 UT) capsule Take 1 capsule (50,000 Units total) by mouth once a week. 12 capsule 0  . glipiZIDE (GLUCOTROL) 5 MG tablet Take 1 tablet (5 mg total) by mouth daily before breakfast. 90 tablet 1  . glucose blood (ONETOUCH VERIO) test strip Use as directed to test blood sugar up to three times daily. E11.9 100 each 6  . ibuprofen (ADVIL,MOTRIN) 800 MG tablet Take 1 tablet (800 mg total) by mouth 2 (two) times daily as needed. 60 tablet 1  . Lancets (ONETOUCH DELICA PLUS [id number]) MISC 1 Device by Does not apply route in the morning, at noon, and at bedtime. Use as directed to test blood sugar up to three times daily. E11.9 100 each 6  . lidocaine (XYLOCAINE) 5 % ointment Apply 1 application topically daily as needed. Apply  to affected areas. 30 g 0  . metFORMIN (GLUCOPHAGE) 500 MG tablet Take 2 tablets (1,000 mg total) by mouth 2 (two) times daily with a meal. 360 tablet 1  . naproxen (NAPROSYN) 500 MG tablet Take 1 tablet (500 mg total) by mouth 2 (two) times daily with a meal. 30 tablet 1  . omeprazole (PRILOSEC) 20 MG capsule Take 1 capsule (20 mg total) by mouth daily. 30 capsule 0  . polyethylene glycol (MIRALAX MIX-IN PAX) 17 g packet Take 17 g by mouth daily. 14 each 0  . propranolol (INDERAL) 10 MG tablet Take 1 tablet (10 mg total) by mouth 3 (three) times daily as needed. 15 tablet 0  . clindamycin-benzoyl peroxide (BENZACLIN) gel Apply topically 2 (two) times daily. (Patient not taking: No sig reported) 25 g 1  . benzonatate (TESSALON) 100 MG capsule Take 1 capsule (100 mg total) by mouth every 8 (eight) hours. (Patient not taking: Reported on 06/02/2020) 21 capsule 0  . predniSONE (DELTASONE) 50 MG tablet Take one tablet by mouth once daily for 5 days. (Patient not taking: Reported on 06/02/2020) 5 tablet 0   No facility-administered medications prior to visit.    Allergies  Allergen Reactions  . Lisinopril Swelling    Swelling in throat, coughing, difficulty swallowing, could not sleep. Occurred Dec 2020.    ROS Review of Systems  Constitutional: Negative.   HENT: Negative.   Eyes: Negative.   Respiratory: Negative.   Cardiovascular: Negative.   Gastrointestinal: Negative.   Endocrine: Negative.   Genitourinary: Negative.   Musculoskeletal: Negative.   Skin: Negative.   Allergic/Immunologic: Negative.   Neurological: Negative.   Hematological: Negative.   Psychiatric/Behavioral: Negative.       Objective:    Physical Exam Vitals and nursing note reviewed.   BP 131/84 (BP Location: Left Arm, Patient Position: Sitting, Cuff Size: Normal)   Pulse 80   Temp 98.2 F (36.8 C) (Oral)   Resp 18   Ht 5' 3"  (1.6 m)   Wt 143 lb (64.9 kg)   LMP 07/06/2020   SpO2 100%   BMI 25.33 kg/m    General Appearance:    Alert, cooperative, no distress, appears stated age  Head:    Normocephalic, without obvious abnormality, atraumatic  Eyes:    PERRL, conjunctiva/corneas clear, EOM's intact, fundi    benign, both eyes  Ears:    Normal TM's and external ear canals, both ears  Nose:   Nares normal, septum midline, mucosa normal, no drainage    or sinus tenderness  Throat:   Lips, mucosa, and tongue normal;  teeth and gums normal  Neck:   Supple, symmetrical, trachea midline, no adenopathy;    thyroid:  no enlargement/tenderness/nodules; no carotid   bruit or JVD  Back:     Symmetric, no curvature, ROM normal, no CVA tenderness  Lungs:     Clear to auscultation bilaterally, respirations unlabored  Chest Wall:    No tenderness or deformity   Heart:    Regular rate and rhythm, S1 and S2 normal, no murmur, rub   or gallop              Extremities:   Extremities normal, atraumatic, no cyanosis or edema  Pulses:   2+ and symmetric all extremities  Skin:   Skin color, texture, turgor normal, no rashes or lesions  Lymph nodes:   Cervical, supraclavicular, and axillary nodes normal  Neurologic:   CNII-XII intact, normal strength, sensation and reflexes    throughout    BP 131/84 (BP Location: Left Arm, Patient Position: Sitting, Cuff Size: Normal)   Pulse 80   Temp 98.2 F (36.8 C) (Oral)   Resp 18   Ht 5' 3"  (1.6 m)   Wt 143 lb (64.9 kg)   LMP 07/06/2020   SpO2 100%   BMI 25.33 kg/m  Wt Readings from Last 3 Encounters:  10/06/20 143 lb (64.9 kg)  03/11/20 171 lb (77.6 kg)  01/26/20 171 lb (77.6 kg)     Health Maintenance Due  Topic Date Due  . Hepatitis C Screening  Never done  . OPHTHALMOLOGY EXAM  Never done  . MAMMOGRAM  08/27/2018  . COLONOSCOPY  Never done    There are no preventive care reminders to display for this patient.  Lab Results  Component Value Date   TSH 1.361 07/12/2019   Lab Results  Component Value Date   WBC 7.2 07/29/2019   HGB 12.8  07/29/2019   HCT 39.2 07/29/2019   MCV 83.2 07/29/2019   PLT 304 07/29/2019   Lab Results  Component Value Date   NA 137 09/18/2020   K 3.9 09/18/2020   CO2 28 09/18/2020   GLUCOSE 222 (H) 09/18/2020   BUN 9 09/18/2020   CREATININE 0.56 09/18/2020   BILITOT 0.6 09/18/2020   ALKPHOS 70 09/18/2020   AST 29 09/18/2020   ALT 29 09/18/2020   PROT 7.3 09/18/2020   ALBUMIN 4.0 09/18/2020   CALCIUM 9.2 09/18/2020   ANIONGAP 10 09/18/2020   Lab Results  Component Value Date   CHOL 89 (L) 11/04/2019   Lab Results  Component Value Date   HDL 43 11/04/2019   Lab Results  Component Value Date   LDLCALC 23 11/04/2019   Lab Results  Component Value Date   TRIG 132 11/04/2019   Lab Results  Component Value Date   CHOLHDL 2.1 11/04/2019   Lab Results  Component Value Date   HGBA1C 7.6 (H) 06/02/2020      Assessment & Plan:   Problem List Items Addressed This Visit      Endocrine   Diabetes mellitus type 2, controlled ([place]) - Primary   Relevant Orders   Ambulatory referral to Ophthalmology     Other   Hyperlipidemia    Other Visit Diagnoses    Screen for colon cancer       Relevant Orders   Ambulatory referral to Gastroenterology    1. Controlled type 2 diabetes mellitus with diabetic nephropathy, without long-term current use of insulin [facility]) Patient has upcoming appointment for medication management.  No  recent diabetic eye exam, patient agreeable to referral to ophthalmology - Ambulatory referral to Ophthalmology  2. Other hyperlipidemia Continue current regimen  3. Screen for colon cancer Patient agreeable to a referral to gastroenterology for screening colonoscopy - Ambulatory referral to Gastroenterology  Paperwork filled out on patient's behalf  I have reviewed the patient's medical history (PMH, PSH, Social History, Family History, Medications, and allergies) , and have been updated if relevant. I spent 20 minutes reviewing chart and  face to face  time with patient.     No orders of the defined types were placed in this encounter.   Follow-up: Return in 1 month (on 11/07/2020), or if symptoms worsen or fail to improve, for [facility].    Loraine Grip Mayers, PA-C

## 2020-10-06 NOTE — Patient Instructions (Signed)
To ensure that your health maintenance is up-to-date, I will begin a referral for you to be seen by gastroenterology for a colonoscopy to screen for colon cancer.  I will also start a referral for you to be seen for a diabetic eye exam.  Your next appointment for health maintenance is November 07, 2020 with Dr. Margarita Rana.  Please let us know if there is anything else we can do for you  Kennieth Rad, PA-C Physician Assistant [place] [email or web address]

## 2020-10-19 ENCOUNTER — Other Ambulatory Visit: Payer: Self-pay | Admitting: Family Medicine

## 2020-10-20 MED FILL — INVOKANA 100 MG TABLET: 100 | 30 days supply | Qty: 30 | Fill #0

## 2020-11-07 ENCOUNTER — Ambulatory Visit: Payer: 59 | Attending: Family Medicine | Admitting: Family Medicine

## 2020-11-07 ENCOUNTER — Other Ambulatory Visit: Payer: Self-pay | Admitting: Family Medicine

## 2020-11-07 ENCOUNTER — Other Ambulatory Visit: Payer: Self-pay

## 2020-11-07 DIAGNOSIS — E119 Type 2 diabetes mellitus without complications: Secondary | ICD-10-CM

## 2020-11-07 DIAGNOSIS — E785 Hyperlipidemia, unspecified: Secondary | ICD-10-CM

## 2020-11-07 DIAGNOSIS — E559 Vitamin D deficiency, unspecified: Secondary | ICD-10-CM | POA: Diagnosis not present

## 2020-11-07 DIAGNOSIS — N951 Menopausal and female climacteric states: Secondary | ICD-10-CM

## 2020-11-07 DIAGNOSIS — E1169 Type 2 diabetes mellitus with other specified complication: Secondary | ICD-10-CM

## 2020-11-07 MED ORDER — CLONIDINE HCL 0.1 MG PO TABS: 0.1000 mg | ORAL_TABLET | Freq: Every evening | ORAL | 1 refills | Status: DC | PRN

## 2020-11-07 MED ORDER — POLYETHYLENE GLYCOL 3350 17 G PO PACK
17.0000 g | PACK | Freq: Every day | ORAL | 0 refills | Status: DC
Start: 2020-11-07 — End: 2020-11-07

## 2020-11-07 NOTE — Progress Notes (Signed)
Would like to get VIT D levels checked. Wants to discuss menopause.

## 2020-11-07 NOTE — Progress Notes (Signed)
Virtual Visit via Telephone Note  I connected with Rebecca [name], on 11/07/2020 at 8:54 AM by telephone due to the COVID-19 pandemic and verified that I am speaking with the correct person using two identifiers.   Consent: I discussed the limitations, risks, security and privacy concerns of performing an evaluation and management service by telephone and the availability of in person appointments. I also discussed with the patient that there may be a patient responsible charge related to this service. The patient expressed understanding and agreed to proceed.   Location of Patient: Home  Location of Provider: Home   Persons participating in Telemedicine visit: Bradyn A Piechocki Garnette Scheuermann ID# [id number] - Interpreter Dr. Margarita Rana     History of Present Illness: Rebecca A Khashmelmooseis a 55 year old female with a history of type 2 diabetes mellitus (A1c 7.6), hyperlipidemia, hypertension who presents today for follow-up visit. She has had a sore throat and Flu like symptoms from the beginning of the year but underwent a COVID test which she states was negative. She is using OTC remedies and is feeling better.  Fasting sugars range from 124-140 and she denies presence of hypoglycemia, visual concerns, numbness in her extremities. She would like to check her hormones to see if she is attaining menopause as she has not had a monthly cycle for the last 6 months and has been experiencing hot flashes. She is also requesting a refill of her Miralax. She would like her Vitamin D level checked.  Past Medical History:  Diagnosis Date  . Arthritis   . Diabetes mellitus   . Hyperlipidemia    Allergies  Allergen Reactions  . Lisinopril Swelling    Swelling in throat, coughing, difficulty swallowing, could not sleep. Occurred Dec 2020.    Current Outpatient Medications on File Prior to Visit  Medication Sig Dispense Refill  . acetaminophen (TYLENOL) 500 MG tablet  Take 1,000 mg by mouth daily as needed for mild pain. Reported on 01/16/2016    . amLODipine (NORVASC) 10 MG tablet Take 1 tablet (10 mg total) by mouth daily. 90 tablet 1  . atorvastatin (LIPITOR) 80 MG tablet Take 1 tablet (80 mg total) by mouth daily. 90 tablet 1  . Blood Glucose Monitoring Suppl (ONETOUCH VERIO REFLECT) w/Device KIT 1 kit by Does not apply route in the morning, at noon, and at bedtime. Use as directed to test blood sugar up to three times daily.E11.9 1 kit 0  . cetirizine (ZYRTEC) 10 MG tablet Take 1 tablet (10 mg total) by mouth daily. 30 tablet 0  . clotrimazole (CLOTRIMAZOLE AF) 1 % cream Apply 1 application topically 2 (two) times daily. 30 g 1  . cyclobenzaprine (FLEXERIL) 10 MG tablet TAKE 1 TABLET(10 MG) BY MOUTH AT BEDTIME 30 tablet 1  . Dapsone 5 % topical gel APPLY ONE PEA SIZED AMOUNT TO FACE TWICE A DAY. 30 g 1  . ergocalciferol (DRISDOL) 1.25 MG (50000 UT) capsule Take 1 capsule (50,000 Units total) by mouth once a week. 12 capsule 0  . glipiZIDE (GLUCOTROL) 5 MG tablet Take 1 tablet (5 mg total) by mouth daily before breakfast. 90 tablet 1  . glucose blood (ONETOUCH VERIO) test strip Use as directed to test blood sugar up to three times daily. E11.9 100 each 6  . ibuprofen (ADVIL,MOTRIN) 800 MG tablet Take 1 tablet (800 mg total) by mouth 2 (two) times daily as needed. 60 tablet 1  . INVOKANA 100 MG TABS tablet TAKE 1 TABLET (100 MG  TOTAL) BY MOUTH DAILY BEFORE BREAKFAST. 30 tablet 3  . Lancets (ONETOUCH DELICA PLUS [id number]) MISC 1 Device by Does not apply route in the morning, at noon, and at bedtime. Use as directed to test blood sugar up to three times daily. E11.9 100 each 6  . lidocaine (XYLOCAINE) 5 % ointment Apply 1 application topically daily as needed. Apply to affected areas. 30 g 0  . metFORMIN (GLUCOPHAGE) 500 MG tablet Take 2 tablets (1,000 mg total) by mouth 2 (two) times daily with a meal. 360 tablet 1  . naproxen (NAPROSYN) 500 MG tablet Take 1  tablet (500 mg total) by mouth 2 (two) times daily with a meal. 30 tablet 1  . omeprazole (PRILOSEC) 20 MG capsule Take 1 capsule (20 mg total) by mouth daily. 30 capsule 0  . polyethylene glycol (MIRALAX MIX-IN PAX) 17 g packet Take 17 g by mouth daily. 14 each 0  . propranolol (INDERAL) 10 MG tablet Take 1 tablet (10 mg total) by mouth 3 (three) times daily as needed. 15 tablet 0  . clindamycin-benzoyl peroxide (BENZACLIN) gel Apply topically 2 (two) times daily. (Patient not taking: No sig reported) 25 g 1   No current facility-administered medications on file prior to visit.    Observations/Objective: Awake, alert, oriented x3  Not in acute distress   Lipid Panel     Component Value Date/Time   CHOL 89 (L) 11/04/2019 1043   TRIG 132 11/04/2019 1043   HDL 43 11/04/2019 1043   CHOLHDL 2.1 11/04/2019 1043   CHOLHDL 5.5 (H) 11/02/2016 1102   VLDL 80 (H) 11/02/2016 1102   LDLCALC 23 11/04/2019 1043   LABVLDL 23 11/04/2019 1043    Lab Results  Component Value Date   HGBA1C 7.6 (H) 06/02/2020    Assessment and Plan: 1. Vasomotor symptoms due to menopause - FSH/LH; Future - cloNIDine (CATAPRES) 0.1 MG tablet; Take 1 tablet (0.1 mg total) by mouth at bedtime as needed.  Dispense: 90 tablet; Refill: 1  2. Type 2 diabetes mellitus without complication, without long-term current use of insulin (HCC) Slightly above goal with A1c of 7.6 Will send off A1c and adjust regimen accordingly - Hemoglobin A1c; Future  3. Hyperlipidemia associated with type 2 diabetes mellitus (HCC) Controlled Low cholesterol diet - Lipid panel; Future  4. Vitamin D deficiency - VITAMIN D 25 Hydroxy (Vit-D Deficiency, Fractures); Future   Follow Up Instructions: 3 months   I discussed the assessment and treatment plan with the patient. The patient was provided an opportunity to ask questions and all were answered. The patient agreed with the plan and demonstrated an understanding of the  instructions.   The patient was advised to call back or seek an in-person evaluation if the symptoms worsen or if the condition fails to improve as anticipated.     I provided 18 minutes total of non-face-to-face time during this encounter including median intraservice time, reviewing previous notes, investigations, ordering medications, medical decision making, coordinating care and patient verbalized understanding at the end of the visit.     [name], MD, FAAFP. [facility] and [facility], [place]   11/07/2020, 8:54 AM

## 2020-11-11 ENCOUNTER — Other Ambulatory Visit: Payer: Self-pay | Admitting: Family Medicine

## 2020-11-11 ENCOUNTER — Ambulatory Visit: Payer: 59 | Attending: Family Medicine

## 2020-11-11 ENCOUNTER — Other Ambulatory Visit: Payer: Self-pay

## 2020-11-11 DIAGNOSIS — E785 Hyperlipidemia, unspecified: Secondary | ICD-10-CM

## 2020-11-11 DIAGNOSIS — E119 Type 2 diabetes mellitus without complications: Secondary | ICD-10-CM

## 2020-11-11 DIAGNOSIS — E559 Vitamin D deficiency, unspecified: Secondary | ICD-10-CM

## 2020-11-11 DIAGNOSIS — E1169 Type 2 diabetes mellitus with other specified complication: Secondary | ICD-10-CM

## 2020-11-11 DIAGNOSIS — N951 Menopausal and female climacteric states: Secondary | ICD-10-CM

## 2020-11-12 LAB — LIPID PANEL
Chol/HDL Ratio: 2.5 ratio (ref 0.0–4.4)
Cholesterol, Total: 116 mg/dL (ref 100–199)
HDL: 46 mg/dL (ref >39)
LDL Chol Calc (NIH): 41 mg/dL (ref 0–99)
Triglycerides: 178 mg/dL — ABNORMAL HIGH (ref 0–149)
VLDL Cholesterol Cal: 29 mg/dL (ref 5–40)

## 2020-11-12 LAB — HEMOGLOBIN A1C
Est. average glucose Bld gHb Est-mCnc: 157 mg/dL
Hgb A1c MFr Bld: 7.1 % — ABNORMAL HIGH (ref 4.8–5.6)

## 2020-11-12 LAB — FSH/LH
FSH: 20.5 m[IU]/mL
LH: 27.1 m[IU]/mL

## 2020-11-12 LAB — VITAMIN D 25 HYDROXY (VIT D DEFICIENCY, FRACTURES): Vit D, 25-Hydroxy: 25.2 ng/mL — ABNORMAL LOW (ref 30.0–100.0)

## 2020-11-13 ENCOUNTER — Other Ambulatory Visit: Payer: Self-pay | Admitting: Family Medicine

## 2020-11-13 MED ORDER — ERGOCALCIFEROL 1.25 MG (50000 UT) PO CAPS: 50000.0000 [IU] | ORAL_CAPSULE | ORAL | 0 refills | Status: DC

## 2020-11-13 NOTE — Progress Notes (Signed)
Patient ID: Rebecca [name], female    DOB: December 16, 1965  MRN: [id number]  CC: Lump Right Axilla  Subjective: Rebecca [name] is a 55 y.o. female who presents for lump of right axilla.  1. LUMP OF AXILLA: Duration :1 week  Location: right Onset: sudden Severity: no pain Redness: no Nipple discharge: no Treatments attempted: none Previous mammogram: 02/20/2016 Benign intramammary lymph node axillary tail left breast. No  evidence of malignancy. Repeat in 1 year.   Patient Active Problem List   Diagnosis Date Noted  . Posterior tibial tendinitis, right leg 01/26/2020  . Diabetes ([place]) 03/11/2017  . Muscle pain 08/06/2013  . Tiredness 08/06/2013  . Candida vaginitis 08/19/2012  . Dental cavities 08/19/2012  . Knee pain, bilateral 04/16/2012  . Diabetes mellitus type 2, controlled ([place]) 04/16/2012  . Hyperlipidemia 04/16/2012  . Palpitations 04/16/2012  . Back pain 04/16/2012     Current Outpatient Medications on File Prior to Visit  Medication Sig Dispense Refill  . amLODipine (NORVASC) 10 MG tablet Take 1 tablet (10 mg total) by mouth daily. 90 tablet 1  . atorvastatin (LIPITOR) 80 MG tablet TAKE 1 TABLET (80 MG TOTAL) BY MOUTH DAILY. 90 tablet 1  . Blood Glucose Monitoring Suppl (ONETOUCH VERIO REFLECT) w/Device KIT 1 kit by Does not apply route in the morning, at noon, and at bedtime. Use as directed to test blood sugar up to three times daily.E11.9 1 kit 0  . cetirizine (ZYRTEC) 10 MG tablet Take 1 tablet (10 mg total) by mouth daily. 30 tablet 0  . cloNIDine (CATAPRES) 0.1 MG tablet Take 1 tablet (0.1 mg total) by mouth at bedtime as needed. 90 tablet 1  . clotrimazole (CLOTRIMAZOLE AF) 1 % cream Apply 1 application topically 2 (two) times daily. 30 g 1  . ergocalciferol (DRISDOL) 1.25 MG (50000 UT) capsule Take 1 capsule (50,000 Units total) by mouth once a week. 12 capsule 0  . glipiZIDE (GLUCOTROL) 5 MG tablet TAKE 1 TABLET BY MOUTH DAILY 90 tablet 1  .  glucose blood (ONETOUCH VERIO) test strip Use as directed to test blood sugar up to three times daily. E11.9 100 each 6  . INVOKANA 100 MG TABS tablet TAKE 1 TABLET (100 MG TOTAL) BY MOUTH DAILY BEFORE BREAKFAST. 30 tablet 3  . Lancets (ONETOUCH DELICA PLUS [id number]) MISC 1 Device by Does not apply route in the morning, at noon, and at bedtime. Use as directed to test blood sugar up to three times daily. E11.9 100 each 6  . metFORMIN (GLUCOPHAGE) 500 MG tablet TAKE 1 TABLET BY MOUTH TWICE DAILY 180 tablet 0  . naproxen (NAPROSYN) 500 MG tablet Take 1 tablet (500 mg total) by mouth 2 (two) times daily with a meal. 30 tablet 1  . omeprazole (PRILOSEC) 20 MG capsule Take 1 capsule (20 mg total) by mouth daily. 30 capsule 0  . polyethylene glycol (MIRALAX MIX-IN PAX) 17 g packet Take 17 g by mouth daily. 14 each 0   No current facility-administered medications on file prior to visit.    Allergies  Allergen Reactions  . Lisinopril Swelling    Swelling in throat, coughing, difficulty swallowing, could not sleep. Occurred Dec 2020.    Social History   Socioeconomic History  . Marital status: Married    Spouse name: Not on file  . Number of children: Not on file  . Years of education: Not on file  . Highest education level: Not on file  Occupational History  .  Not on file  Tobacco Use  . Smoking status: Passive Smoke Exposure - Never Smoker  . Smokeless tobacco: Never Used  Substance and Sexual Activity  . Alcohol use: No  . Drug use: No  . Sexual activity: Yes  Other Topics Concern  . Not on file  Social History Narrative  . Not on file   Social Determinants of Health   Financial Resource Strain: Not on file  Food Insecurity: Not on file  Transportation Needs: Not on file  Physical Activity: Not on file  Stress: Not on file  Social Connections: Not on file  Intimate Partner Violence: Not on file    Family History  Problem Relation Age of Onset  . Diabetes Mother   .  Heart disease Mother   . Diabetes Father   . Cancer Father 71       Throat  . Diabetes Sister   . Diabetes Brother     Past Surgical History:  Procedure Laterality Date  . CESAREAN SECTION      ROS: Review of Systems Negative except as stated above  PHYSICAL EXAM: BP 127/86 (BP Location: Left Arm, Patient Position: Sitting)   Pulse 79   Wt 143 lb 12.8 oz (65.2 kg)   SpO2 98%   BMI 25.47 kg/m   Physical Exam HENT:     Head: Normocephalic.  Eyes:     Extraocular Movements: Extraocular movements intact.     Pupils: Pupils are equal, round, and reactive to light.  Cardiovascular:     Rate and Rhythm: Normal rate and regular rhythm.     Pulses: Normal pulses.     Heart sounds: Normal heart sounds.  Pulmonary:     Effort: Pulmonary effort is normal.     Breath sounds: Normal breath sounds.  Chest:  Breasts:     Right: Axillary adenopathy present.      Comments: Patient declined. Musculoskeletal:     Cervical back: Normal range of motion and neck supple.  Lymphadenopathy:     Upper Body:     Right upper body: Axillary adenopathy present.     Comments: Soft, movable, non-tender, no erythema, no drainge mass located at the right axillary.  Neurological:     General: No focal deficit present.     Mental Status: She is alert and oriented to person, place, and time.  Psychiatric:        Mood and Affect: Mood normal.        Behavior: Behavior normal.     ASSESSMENT AND PLAN: 1. Encounter for screening mammogram for malignant neoplasm of breast: - Patient reports right axillary mass present for 1 week.  - Last mammogram 02/20/2016. Resulted with benign intramammary lymph node axillary tail left breast. No evidence of malignancy. - Mammogram for further evaluation and management. - MM Digital Screening; Future  2. Language barrier: - [employer] participated during today's visit.  - Interpreter Name: Anders Grant, ID#: [id number]  Patient was given the opportunity to  ask questions.  Patient verbalized understanding of the plan and was able to repeat key elements of the plan. Patient was given clear instructions to go to Emergency Department or return to medical center if symptoms don't improve, worsen, or new problems develop.The patient verbalized understanding.   Orders Placed This Encounter  Procedures  . MM Digital Screening     Requested Prescriptions    No prescriptions requested or ordered in this encounter    Return for as needed with Dr. Margarita Rana.  Regino Fournet  Zachery Dauer, NP

## 2020-11-14 ENCOUNTER — Ambulatory Visit (INDEPENDENT_AMBULATORY_CARE_PROVIDER_SITE_OTHER): Payer: 59 | Admitting: Family

## 2020-11-14 ENCOUNTER — Other Ambulatory Visit: Payer: Self-pay

## 2020-11-14 ENCOUNTER — Encounter: Payer: Self-pay | Admitting: Family

## 2020-11-14 VITALS — BP 127/86 | HR 79 | Wt 143.8 lb

## 2020-11-14 DIAGNOSIS — Z1231 Encounter for screening mammogram for malignant neoplasm of breast: Secondary | ICD-10-CM

## 2020-11-14 DIAGNOSIS — Z789 Other specified health status: Secondary | ICD-10-CM

## 2020-11-14 NOTE — Patient Instructions (Signed)
   Referral for mammogram.   Mammogram A mammogram is an X-ray of the breasts. This is done to check for changes that are not normal. This test can look for changes that may be caused by breast cancer or other problems. Mammograms are regularly done on women beginning at age 53. A man may have a mammogram if he has a lump or swelling in his breast. Tell a doctor:  About any allergies you have.  If you have breast implants.  If you have had breast disease, biopsy, or surgery.  If you have a family history of breast cancer.  If you are breastfeeding.  Whether you are pregnant or may be pregnant. What are the risks? Generally, this is a safe procedure. But problems may occur, including:  Being exposed to radiation. Radiation levels are very low with this test.  The need for more tests.  The results were not read properly.  Trouble finding breast cancer in women with dense breasts. What happens before the test?  Have this test done about 1-2 weeks after your menstrual period. This is often when your breasts are the least tender.  If you are visiting a new doctor or clinic, have any past mammogram images sent to your new doctor's office.  Wash your breasts and under your arms on the day of the test.  Do not use deodorants, perfumes, lotions, or powders on the day of the test.  Take off any jewelry from your neck.  Wear clothes that you can change into and out of easily. What happens during the test?  You will take off your clothes from the waist up. You will put on a gown.  You will stand in front of the X-ray machine.  Each breast will be placed between two plastic or glass plates. The plates will press down on your breast for a few seconds. Try to relax. This does not cause any harm to your breasts. It may not feel comfortable, but it will be very brief.  X-rays will be taken from different angles of each breast. The procedure may vary among doctors and hospitals.    What can I expect after the test?  The mammogram will be read by a specialist (radiologist).  You may need to do parts of the test again. This depends on the quality of the images.  You may go back to your normal activities.  It is up to you to get the results of your test. Ask how to get your results when they are ready. Summary  A mammogram is an X-ray of the breasts. It looks for changes that may be caused by breast cancer or other problems.  A man may have this test if he has a lump or swelling in his breast.  Before the test, tell your doctor about any breast problems that you have had in the past.  Have this test done about 1-2 weeks after your menstrual period.  Ask when your test results will be ready. Make sure you get your test results. This information is not intended to replace advice given to you by your health care provider. Make sure you discuss any questions you have with your health care provider. Document Revised: 08/08/2020 Document Reviewed: 08/08/2020 Elsevier Patient Education  2021 [employer].

## 2020-11-14 NOTE — Progress Notes (Signed)
Small mass right axilla  Noticed last week-no pain  Feel like balance has been off for week

## 2020-11-23 ENCOUNTER — Other Ambulatory Visit: Payer: Self-pay | Admitting: Pharmacist

## 2020-11-23 ENCOUNTER — Telehealth: Payer: Self-pay | Admitting: Family Medicine

## 2020-11-23 MED ORDER — DAPAGLIFLOZIN PROPANEDIOL 10 MG PO TABS: 10.0000 mg | ORAL_TABLET | Freq: Every day | ORAL | 1 refills | Status: DC

## 2020-11-23 NOTE — Telephone Encounter (Signed)
Pt is needing medication refills sent to New pharmacy due to insurance.  I spoke with luke and he states that Wilder Glade is what is covered by her insurance.

## 2020-11-23 NOTE — Telephone Encounter (Signed)
Done

## 2020-11-23 NOTE — Telephone Encounter (Signed)
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 Patient also wanted a prescription for Invokana but Wilder Glade is covered by [facility]. Patient can be reached at [phone]

## 2020-12-14 ENCOUNTER — Telehealth: Payer: Self-pay | Admitting: Family Medicine

## 2020-12-14 NOTE — Telephone Encounter (Signed)
Patient came in to follow up about her opthamology referral. Did not see where it had been sent out anywhere. Please follow up.

## 2020-12-19 NOTE — Telephone Encounter (Signed)
Referral was sent to   [facility]

## 2020-12-27 ENCOUNTER — Ambulatory Visit: Payer: 59

## 2020-12-27 ENCOUNTER — Ambulatory Visit: Payer: 59 | Attending: Family Medicine | Admitting: Family Medicine

## 2020-12-27 ENCOUNTER — Other Ambulatory Visit: Payer: Self-pay

## 2020-12-27 ENCOUNTER — Other Ambulatory Visit (HOSPITAL_COMMUNITY)
Admission: RE | Admit: 2020-12-27 | Discharge: 2020-12-27 | Disposition: A | Discharge status: Home / Self Care | Payer: 59 | Source: Ambulatory Visit | Attending: Family Medicine | Admitting: Family Medicine

## 2020-12-27 ENCOUNTER — Encounter: Payer: Self-pay | Admitting: Family Medicine

## 2020-12-27 ENCOUNTER — Ambulatory Visit
Admission: RE | Admit: 2020-12-27 | Discharge: 2020-12-27 | Disposition: A | Discharge status: Home / Self Care | Payer: 59 | Source: Ambulatory Visit | Attending: Family | Admitting: Family

## 2020-12-27 ENCOUNTER — Other Ambulatory Visit: Payer: Self-pay | Admitting: Family

## 2020-12-27 VITALS — BP 163/85 | HR 93 | Ht 63.0 in | Wt 148.0 lb

## 2020-12-27 DIAGNOSIS — Z124 Encounter for screening for malignant neoplasm of cervix: Secondary | ICD-10-CM

## 2020-12-27 DIAGNOSIS — N63 Unspecified lump in unspecified breast: Secondary | ICD-10-CM

## 2020-12-27 DIAGNOSIS — Z1231 Encounter for screening mammogram for malignant neoplasm of breast: Secondary | ICD-10-CM | POA: Diagnosis not present

## 2020-12-27 DIAGNOSIS — Z0000 Encounter for general adult medical examination without abnormal findings: Secondary | ICD-10-CM | POA: Diagnosis not present

## 2020-12-27 DIAGNOSIS — R2231 Localized swelling, mass and lump, right upper limb: Secondary | ICD-10-CM

## 2020-12-27 DIAGNOSIS — Z1159 Encounter for screening for other viral diseases: Secondary | ICD-10-CM

## 2020-12-27 DIAGNOSIS — Z1211 Encounter for screening for malignant neoplasm of colon: Secondary | ICD-10-CM

## 2020-12-27 NOTE — Patient Instructions (Signed)
Health Maintenance, Female Adopting a healthy lifestyle and getting preventive care are important in promoting health and wellness. Ask your health care provider about:  The right schedule for you to have regular tests and exams.  Things you can do on your own to prevent diseases and keep yourself healthy. What should I know about diet, weight, and exercise? Eat a healthy diet  Eat a diet that includes plenty of vegetables, fruits, low-fat dairy products, and lean protein.  Do not eat a lot of foods that are high in solid fats, added sugars, or sodium.   Maintain a healthy weight Body mass index (BMI) is used to identify weight problems. It estimates body fat based on height and weight. Your health care provider can help determine your BMI and help you achieve or maintain a healthy weight. Get regular exercise Get regular exercise. This is one of the most important things you can do for your health. Most adults should:  Exercise for at least 150 minutes each week. The exercise should increase your heart rate and make you sweat (moderate-intensity exercise).  Do strengthening exercises at least twice a week. This is in addition to the moderate-intensity exercise.  Spend less time sitting. Even light physical activity can be beneficial. Watch cholesterol and blood lipids Have your blood tested for lipids and cholesterol at 53 years of age, then have this test every 5 years. Have your cholesterol levels checked more often if:  Your lipid or cholesterol levels are high.  You are older than 53 years of age.  You are at high risk for heart disease. What should I know about cancer screening? Depending on your health history and family history, you may need to have cancer screening at various ages. This may include screening for:  Breast cancer.  Cervical cancer.  Colorectal cancer.  Skin cancer.  Lung cancer. What should I know about heart disease, diabetes, and high blood  pressure? Blood pressure and heart disease  High blood pressure causes heart disease and increases the risk of stroke. This is more likely to develop in people who have high blood pressure readings, are of African descent, or are overweight.  Have your blood pressure checked: ? Every 3-5 years if you are 18-39 years of age. ? Every year if you are 40 years old or older. Diabetes Have regular diabetes screenings. This checks your fasting blood sugar level. Have the screening done:  Once every three years after age 40 if you are at a normal weight and have a low risk for diabetes.  More often and at a younger age if you are overweight or have a high risk for diabetes. What should I know about preventing infection? Hepatitis B If you have a higher risk for hepatitis B, you should be screened for this virus. Talk with your health care provider to find out if you are at risk for hepatitis B infection. Hepatitis C Testing is recommended for:  Everyone born from 1945 through 1965.  Anyone with known risk factors for hepatitis C. Sexually transmitted infections (STIs)  Get screened for STIs, including gonorrhea and chlamydia, if: ? You are sexually active and are younger than 53 years of age. ? You are older than 53 years of age and your health care provider tells you that you are at risk for this type of infection. ? Your sexual activity has changed since you were last screened, and you are at increased risk for chlamydia or gonorrhea. Ask your health care provider   if you are at risk.  Ask your health care provider about whether you are at high risk for HIV. Your health care provider may recommend a prescription medicine to help prevent HIV infection. If you choose to take medicine to prevent HIV, you should first get tested for HIV. You should then be tested every 3 months for as long as you are taking the medicine. Pregnancy  If you are about to stop having your period (premenopausal) and  you may become pregnant, seek counseling before you get pregnant.  Take 400 to 800 micrograms (mcg) of folic acid every day if you become pregnant.  Ask for birth control (contraception) if you want to prevent pregnancy. Osteoporosis and menopause Osteoporosis is a disease in which the bones lose minerals and strength with aging. This can result in bone fractures. If you are 65 years old or older, or if you are at risk for osteoporosis and fractures, ask your health care provider if you should:  Be screened for bone loss.  Take a calcium or vitamin D supplement to lower your risk of fractures.  Be given hormone replacement therapy (HRT) to treat symptoms of menopause. Follow these instructions at home: Lifestyle  Do not use any products that contain nicotine or tobacco, such as cigarettes, e-cigarettes, and chewing tobacco. If you need help quitting, ask your health care provider.  Do not use street drugs.  Do not share needles.  Ask your health care provider for help if you need support or information about quitting drugs. Alcohol use  Do not drink alcohol if: ? Your health care provider tells you not to drink. ? You are pregnant, may be pregnant, or are planning to become pregnant.  If you drink alcohol: ? Limit how much you use to 0-1 drink a day. ? Limit intake if you are breastfeeding.  Be aware of how much alcohol is in your drink. In the [place] one drink equals one 12 oz bottle of beer (355 mL), one 5 oz glass of wine (148 mL), or one 1 oz glass of hard liquor (44 mL). General instructions  Schedule regular health, dental, and eye exams.  Stay current with your vaccines.  Tell your health care provider if: ? You often feel depressed. ? You have ever been abused or do not feel safe at home. Summary  Adopting a healthy lifestyle and getting preventive care are important in promoting health and wellness.  Follow your health care provider's instructions about healthy  diet, exercising, and getting tested or screened for diseases.  Follow your health care provider's instructions on monitoring your cholesterol and blood pressure. This information is not intended to replace advice given to you by your health care provider. Make sure you discuss any questions you have with your health care provider. Document Revised: 10/01/2018 Document Reviewed: 10/01/2018 Elsevier Patient Education  2021 Elsevier Inc.  

## 2020-12-27 NOTE — Progress Notes (Signed)
Pt would like to get her kidney levels checked. She also want to check for protein in urine.

## 2020-12-27 NOTE — Progress Notes (Signed)
Subjective:  Patient ID: Rebecca [name], female    DOB: 10-06-1966  Age: 55 y.o. MRN: [id number]  CC: Annual Exam   HPI Leilanny A Khashmelmooseis a 55 year old female with a history of type 2 diabetes mellitus (A1c 7.1), hyperlipidemia, hypertensionwho presents today for an annual physical exam She complains of right axillary mass for which she had a visit with a nurse practitioner previously and a mammogram ordered.  She went for her mammogram appointment today and was informed the order was wrong.  I have reviewed her chart and see that a breast ultrasound and diagnostic mammogram have been ordered by the NP. She would like to have her kidneys checked due to history of proteinuria.  Past Medical History:  Diagnosis Date  . Arthritis   . Diabetes mellitus   . Hyperlipidemia     Past Surgical History:  Procedure Laterality Date  . CESAREAN SECTION      Family History  Problem Relation Age of Onset  . Diabetes Mother   . Heart disease Mother   . Diabetes Father   . Cancer Father 24       Throat  . Diabetes Sister   . Diabetes Brother     Allergies  Allergen Reactions  . Lisinopril Swelling    Swelling in throat, coughing, difficulty swallowing, could not sleep. Occurred Dec 2020.    Outpatient Medications Prior to Visit  Medication Sig Dispense Refill  . amLODipine (NORVASC) 10 MG tablet Take 1 tablet (10 mg total) by mouth daily. 90 tablet 1  . atorvastatin (LIPITOR) 80 MG tablet TAKE 1 TABLET (80 MG TOTAL) BY MOUTH DAILY. 90 tablet 1  . Blood Glucose Monitoring Suppl (ONETOUCH VERIO REFLECT) w/Device KIT 1 kit by Does not apply route in the morning, at noon, and at bedtime. Use as directed to test blood sugar up to three times daily.E11.9 1 kit 0  . cetirizine (ZYRTEC) 10 MG tablet Take 1 tablet (10 mg total) by mouth daily. 30 tablet 0  . cloNIDine (CATAPRES) 0.1 MG tablet Take 1 tablet (0.1 mg total) by mouth at bedtime as needed. 90 tablet 1  . clotrimazole  (CLOTRIMAZOLE AF) 1 % cream Apply 1 application topically 2 (two) times daily. 30 g 1  . dapagliflozin propanediol (FARXIGA) 10 MG TABS tablet Take 1 tablet (10 mg total) by mouth daily before breakfast. 90 tablet 1  . ergocalciferol (DRISDOL) 1.25 MG (50000 UT) capsule Take 1 capsule (50,000 Units total) by mouth once a week. 12 capsule 0  . glipiZIDE (GLUCOTROL) 5 MG tablet TAKE 1 TABLET BY MOUTH DAILY 90 tablet 1  . glucose blood (ONETOUCH VERIO) test strip Use as directed to test blood sugar up to three times daily. E11.9 100 each 6  . Lancets (ONETOUCH DELICA PLUS [id number]) MISC 1 Device by Does not apply route in the morning, at noon, and at bedtime. Use as directed to test blood sugar up to three times daily. E11.9 100 each 6  . metFORMIN (GLUCOPHAGE) 500 MG tablet TAKE 1 TABLET BY MOUTH TWICE DAILY 180 tablet 0  . naproxen (NAPROSYN) 500 MG tablet Take 1 tablet (500 mg total) by mouth 2 (two) times daily with a meal. 30 tablet 1  . omeprazole (PRILOSEC) 20 MG capsule Take 1 capsule (20 mg total) by mouth daily. 30 capsule 0  . polyethylene glycol (MIRALAX MIX-IN PAX) 17 g packet Take 17 g by mouth daily. 14 each 0   No facility-administered medications prior to visit.  ROS Review of Systems  Constitutional: Negative for activity change, appetite change and fatigue.  HENT: Negative for congestion, sinus pressure and sore throat.   Eyes: Negative for visual disturbance.  Respiratory: Negative for cough, chest tightness, shortness of breath and wheezing.   Cardiovascular: Negative for chest pain and palpitations.  Gastrointestinal: Negative for abdominal distention, abdominal pain and constipation.  Endocrine: Negative for polydipsia.  Genitourinary: Negative for dysuria and frequency.  Musculoskeletal: Negative for arthralgias and back pain.  Skin: Negative for rash.  Neurological: Negative for tremors, light-headedness and numbness.  Hematological: Does not bruise/bleed easily.   Psychiatric/Behavioral: Negative for agitation and behavioral problems.    Objective:  BP (!) 163/85   Pulse 93   Ht 5' 3"  (1.6 m)   Wt 148 lb (67.1 kg)   SpO2 100%   BMI 26.22 kg/m   BP/Weight 12/27/2020 11/14/2020 34/74/2595  Systolic BP 638 756 433  Diastolic BP 85 86 84  Wt. (Lbs) 148 143.8 143  BMI 26.22 25.47 25.33      Physical Exam Constitutional:      General: She is not in acute distress.    Appearance: She is well-developed and well-nourished. She is not diaphoretic.  HENT:     Head: Normocephalic.     Right Ear: External ear normal.     Left Ear: External ear normal.     Nose: Nose normal.     Mouth/Throat:     Mouth: Oropharynx is clear and moist.  Eyes:     Extraocular Movements: EOM normal.     Conjunctiva/sclera: Conjunctivae normal.     Pupils: Pupils are equal, round, and reactive to light.  Neck:     Vascular: No JVD.  Cardiovascular:     Rate and Rhythm: Normal rate and regular rhythm.     Pulses: Intact distal pulses.     Heart sounds: Normal heart sounds. No murmur heard. No gallop.   Pulmonary:     Effort: Pulmonary effort is normal. No respiratory distress.     Breath sounds: Normal breath sounds. No wheezing or rales.  Chest:     Chest wall: No tenderness.  Breasts:     Right: Axillary adenopathy (mass - 3x3 cm, no TTP) present. No skin change, tenderness or supraclavicular adenopathy.     Left: No skin change, tenderness or supraclavicular adenopathy.    Abdominal:     General: Bowel sounds are normal. There is no distension.     Palpations: Abdomen is soft. There is no mass.     Tenderness: There is no abdominal tenderness.  Genitourinary:    Comments: External genitalia-circumcised Vagina, cervix, adnexa-normal Musculoskeletal:        General: No tenderness or edema. Normal range of motion.     Cervical back: Normal range of motion.  Lymphadenopathy:     Upper Body:     Right upper body: Axillary adenopathy (mass - 3x3 cm, no  TTP) present. No supraclavicular adenopathy.     Left upper body: No supraclavicular adenopathy.  Skin:    General: Skin is warm and dry.  Neurological:     Mental Status: She is alert and oriented to person, place, and time.     Deep Tendon Reflexes: Reflexes are normal and symmetric.  Psychiatric:        Mood and Affect: Mood and affect normal.     CMP Latest Ref Rng & Units 09/18/2020 06/02/2020 11/04/2019  Glucose 70 - 99 mg/dL 222(H) 159(H) 115(H)  BUN 6 -  20 mg/dL 9 14 12   Creatinine 0.44 - 1.00 mg/dL 0.56 0.67 0.53(L)  Sodium 135 - 145 mmol/L 137 137 137  Potassium 3.5 - 5.1 mmol/L 3.9 4.3 4.3  Chloride 98 - 111 mmol/L 99 100 97  CO2 22 - 32 mmol/L 28 17(L) 24  Calcium 8.9 - 10.3 mg/dL 9.2 9.4 9.5  Total Protein 6.5 - 8.1 g/dL 7.3 7.6 7.1  Total Bilirubin 0.3 - 1.2 mg/dL 0.6 0.3 0.5  Alkaline Phos 38 - 126 U/L 70 92 76  AST 15 - 41 U/L 29 24 19   ALT 0 - 44 U/L 29 22 18     Lipid Panel     Component Value Date/Time   CHOL 116 11/11/2020 0908   TRIG 178 (H) 11/11/2020 0908   HDL 46 11/11/2020 0908   CHOLHDL 2.5 11/11/2020 0908   CHOLHDL 5.5 (H) 11/02/2016 1102   VLDL 80 (H) 11/02/2016 1102   LDLCALC 41 11/11/2020 0908    CBC    Component Value Date/Time   WBC 7.2 07/29/2019 2115   RBC 4.71 07/29/2019 2115   HGB 12.8 07/29/2019 2115   HCT 39.2 07/29/2019 2115   PLT 304 07/29/2019 2115   MCV 83.2 07/29/2019 2115   MCH 27.2 07/29/2019 2115   MCHC 32.7 07/29/2019 2115   RDW 13.5 07/29/2019 2115   LYMPHSABS 3.0 07/29/2019 2115   MONOABS 0.4 07/29/2019 2115   EOSABS 0.2 07/29/2019 2115   BASOSABS 0.0 07/29/2019 2115    Lab Results  Component Value Date   HGBA1C 7.1 (H) 11/11/2020    Assessment & Plan:  1. Annual physical exam Counseled on 150 minutes of exercise per week, healthy eating (including decreased daily intake of saturated fats, cholesterol, added sugars, sodium),routine healthcare maintenance. - Microalbumin / creatinine urine ratio -  CMP14+EGFR  2. Encounter for screening mammogram for malignant neoplasm of breast Diagnostic mammogram and breast ultrasound ordered by nurse practitioner earlier today We will assist in scheduling this  3. Screening for cervical cancer - Cytology - PAP([facility])  4. Screening for colon cancer - Ambulatory referral to Gastroenterology  5. Mass of right axilla See #2 above  6. Need for hepatitis C screening test - HCV RNA quant rflx ultra or genotyp([facility])    No orders of the defined types were placed in this encounter.   Follow-up: Return in about 6 weeks (around 02/07/2021) for Diabetes mellitus.       [name], MD, FAAFP. [facility] [place], [place]   12/27/2020, 12:16 PM

## 2020-12-28 LAB — CMP14+EGFR
ALT: 17 IU/L (ref 0–32)
AST: 22 IU/L (ref 0–40)
Albumin/Globulin Ratio: 1.7 (ref 1.2–2.2)
Albumin: 4.7 g/dL (ref 3.8–4.9)
Alkaline Phosphatase: 76 IU/L (ref 44–121)
BUN/Creatinine Ratio: 20 (ref 9–23)
BUN: 11 mg/dL (ref 6–24)
Bilirubin Total: 0.6 mg/dL (ref 0.0–1.2)
CO2: 22 mmol/L (ref 20–29)
Calcium: 9.6 mg/dL (ref 8.7–10.2)
Chloride: 100 mmol/L (ref 96–106)
Creatinine, Ser: 0.54 mg/dL — ABNORMAL LOW (ref 0.57–1.00)
Globulin, Total: 2.8 g/dL (ref 1.5–4.5)
Glucose: 120 mg/dL — ABNORMAL HIGH (ref 65–99)
Potassium: 4.2 mmol/L (ref 3.5–5.2)
Sodium: 140 mmol/L (ref 134–144)
Total Protein: 7.5 g/dL (ref 6.0–8.5)
eGFR: 111 mL/min/{1.73_m2} (ref >59)

## 2020-12-28 LAB — MICROALBUMIN / CREATININE URINE RATIO
Creatinine, Urine: 72.4 mg/dL
Microalb/Creat Ratio: <4 mg/g creat (ref 0–29)
Microalbumin, Urine: <3 ug/mL

## 2020-12-29 LAB — CYTOLOGY - PAP
Adequacy: ABSENT
Comment: NEGATIVE
Diagnosis: NEGATIVE
High risk HPV: NEGATIVE

## 2020-12-30 ENCOUNTER — Telehealth: Payer: Self-pay

## 2020-12-30 NOTE — Telephone Encounter (Signed)
-----   Message from [name], MD sent at 12/29/2020  1:07 PM EST ----- Please inform her that her Pap smear is normal.  Kidney function is normal and her urine is negative for proteinuria.  Other labs are stable.

## 2020-12-30 NOTE — Telephone Encounter (Signed)
Patient name and DOB has been verified Patient was informed of lab results. Patient had no questions.  

## 2021-01-03 ENCOUNTER — Other Ambulatory Visit: Payer: Self-pay | Admitting: Family

## 2021-01-03 ENCOUNTER — Ambulatory Visit
Admission: RE | Admit: 2021-01-03 | Discharge: 2021-01-03 | Disposition: A | Discharge status: Home / Self Care | Payer: 59 | Source: Ambulatory Visit | Attending: Family | Admitting: Family

## 2021-01-03 ENCOUNTER — Other Ambulatory Visit: Payer: Self-pay

## 2021-01-03 DIAGNOSIS — N63 Unspecified lump in unspecified breast: Secondary | ICD-10-CM

## 2021-01-03 IMAGING — US US BREAST*R* LIMITED INC AXILLA
1 series · 13 of 18 positions shown · non-contrast
Comparison: Previous exam(s).

CLINICAL DATA: 54-year-old female with a palpable mass in the right
axilla for 2 months.

EXAM:
DIGITAL DIAGNOSTIC BILATERAL MAMMOGRAM WITH TOMOSYNTHESIS AND CAD;
ULTRASOUND RIGHT BREAST LIMITED
TECHNIQUE: Bilateral digital diagnostic mammography and breast tomosynthesis
was performed. The images were evaluated with computer-aided
detection.; Targeted ultrasound examination of the right breast was
performed

[Series 1: us breast*right* limited inc axilla · 0.06mm/px · 13 of 18 slices shown]
[im 1/18]
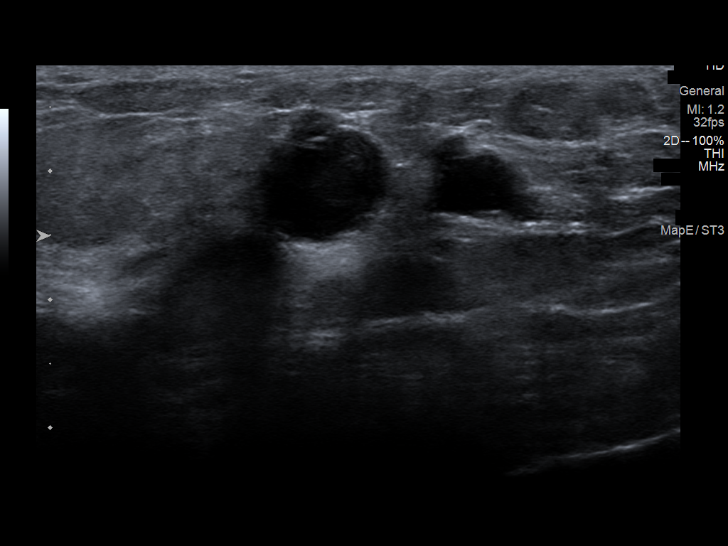
[im 3/18]
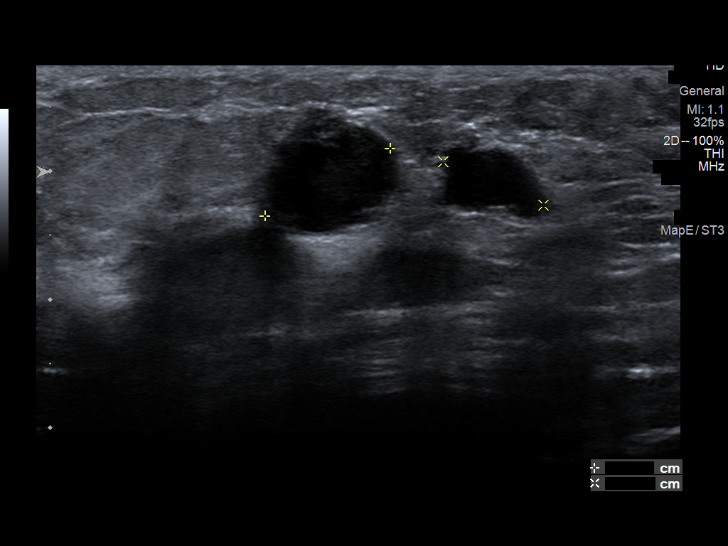
[im 4/18]
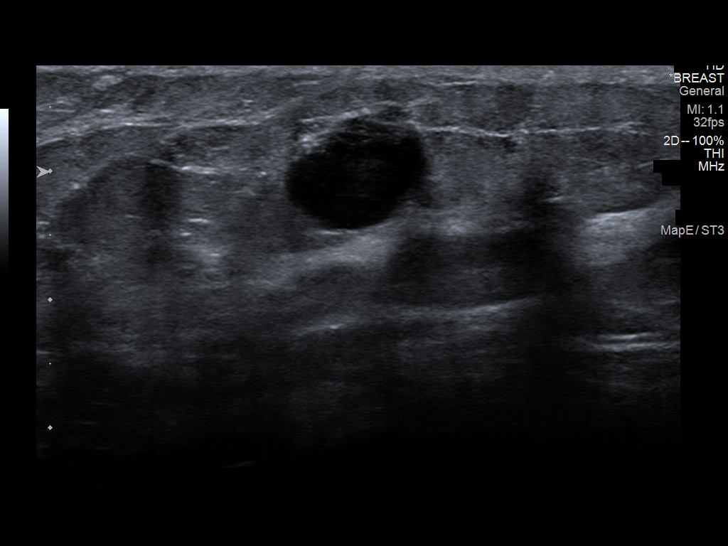
[im 5/18]
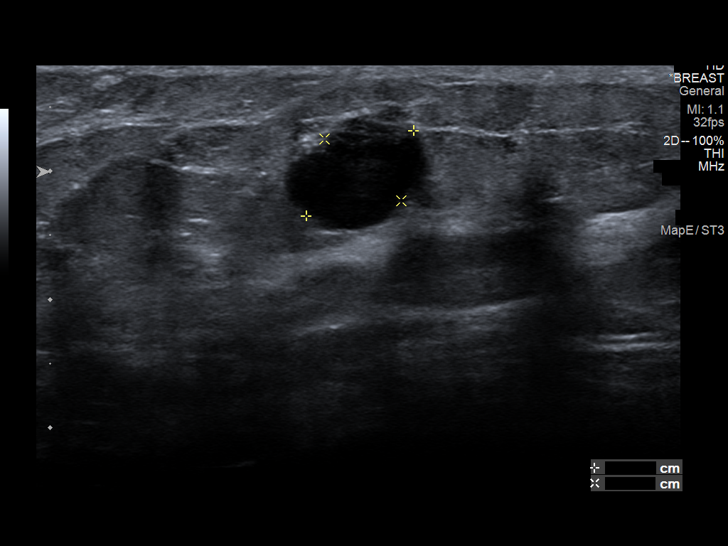
[im 7/18]
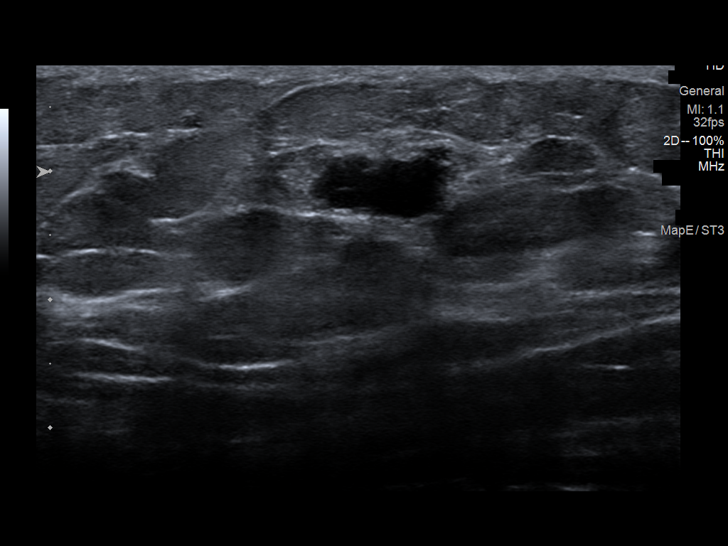
[im 8/18]
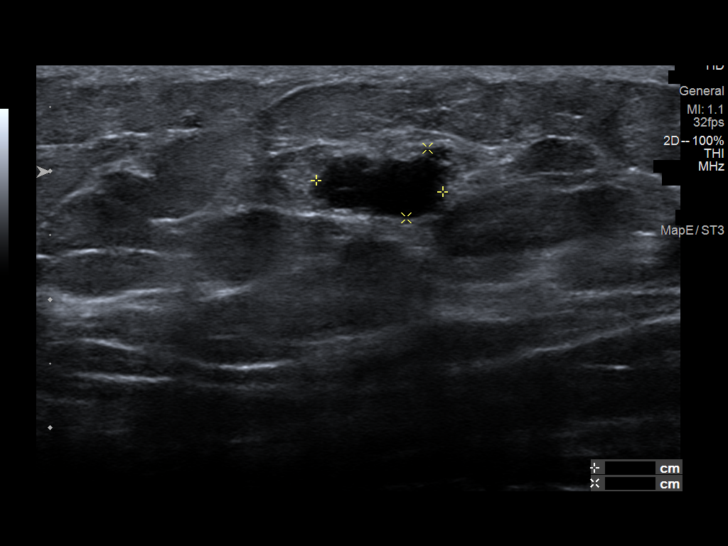
[im 10/18]
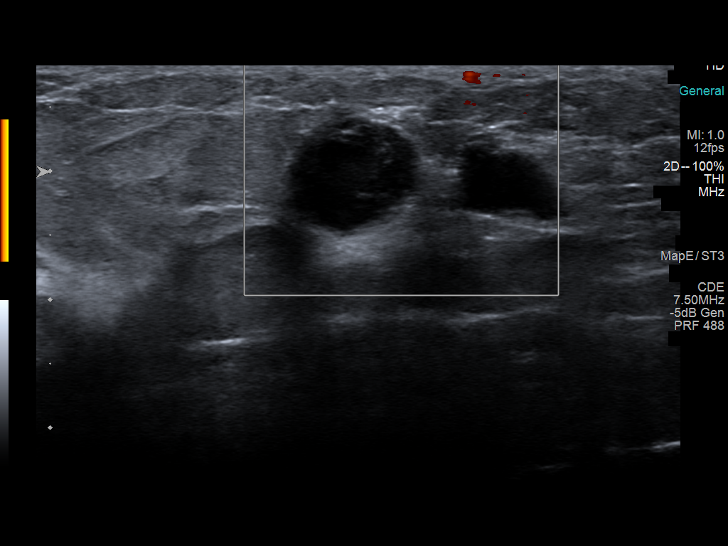
[im 11/18]
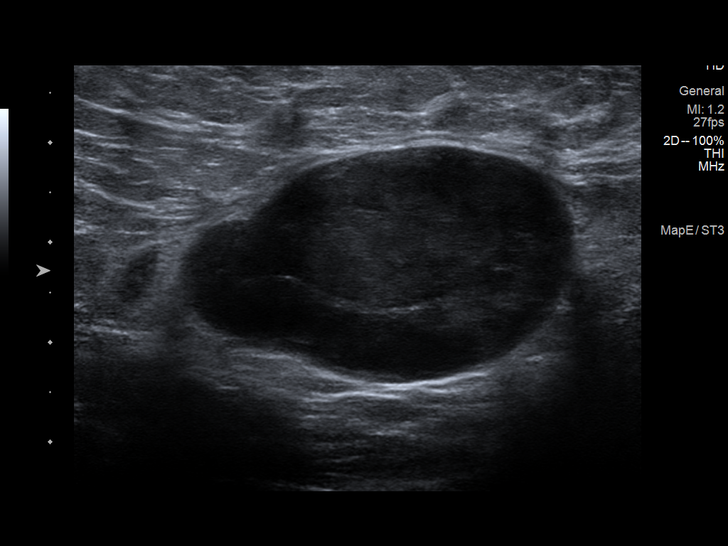
[im 12/18]
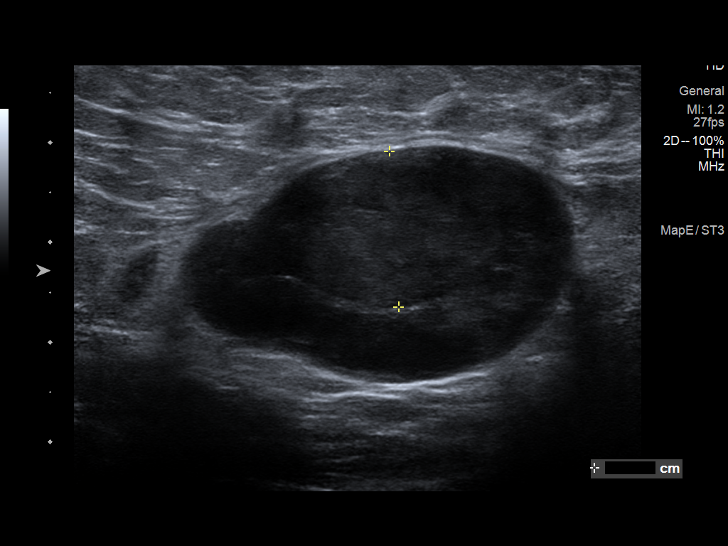
[im 14/18]
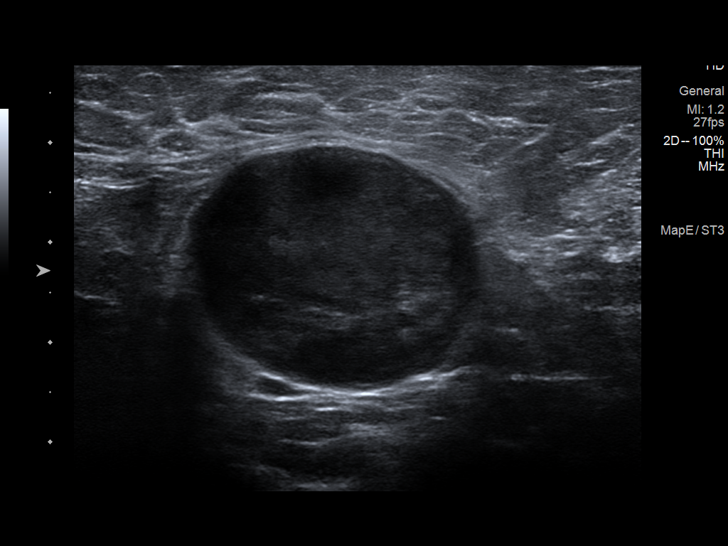
[im 15/18]
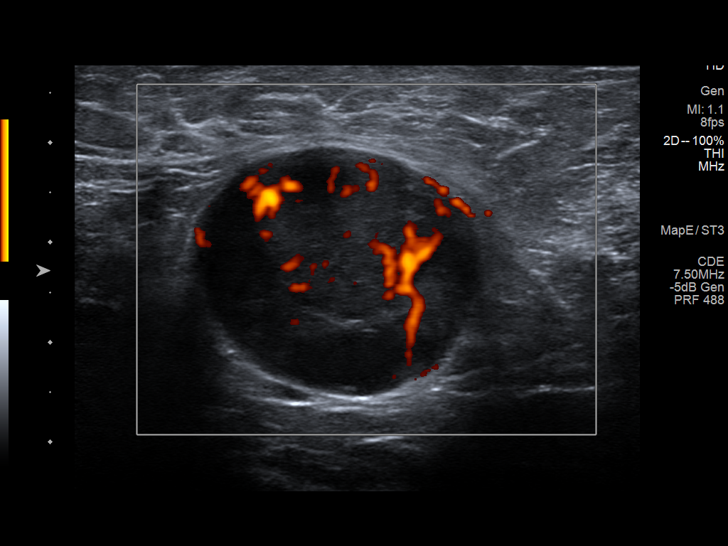
[im 16/18]
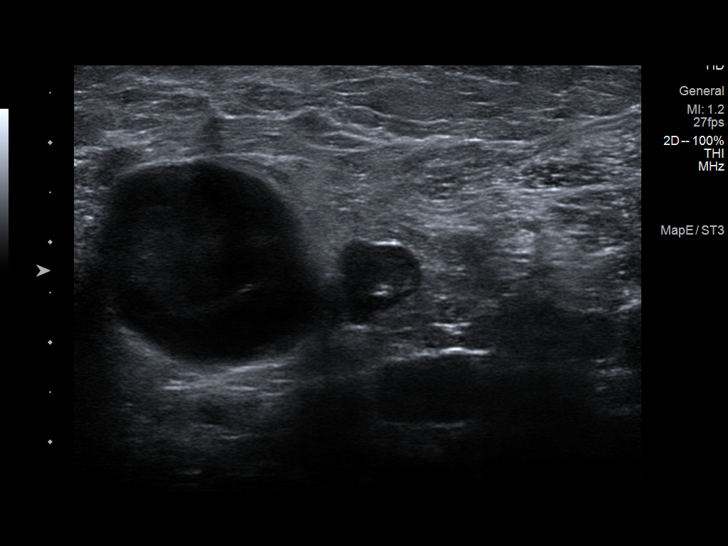
[im 18/18]
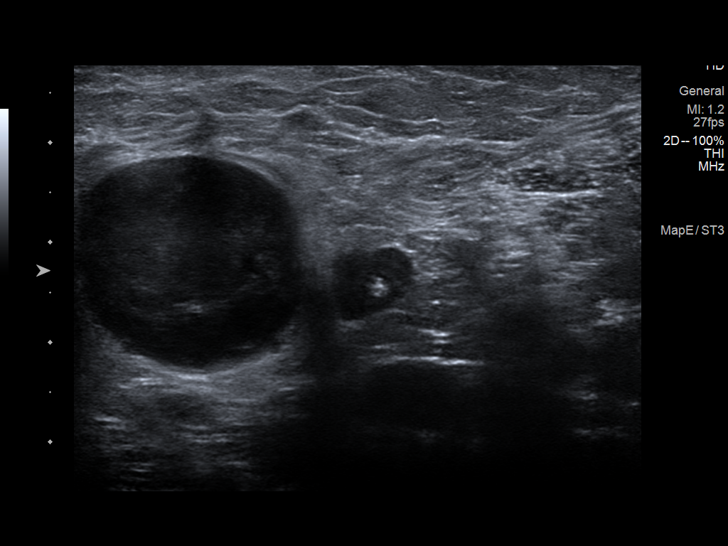

[13 of 18 positions shown; findings below may reference images not displayed]

ACR Breast Density Category b: There are scattered areas of
fibroglandular density.
FINDINGS: There is been interval development of an irregular hyperdense mass
in the inferior right breast at posterior depth. No additional
suspicious findings in the remainder of either breast. A radiopaque
BB was placed in the right axilla with no definite finding seen
mammographically.

Targeted ultrasound is performed, showing 2 adjacent hypoechoic
masses with associated vascularity at the 5 o'clock position 6 cm
from the nipple. They measure 11 x 10 x 8 mm and 10 x 9 x 6 mm. In
total this area spans approximately 2.2 cm. This correlates with the
mammographic finding.

Evaluation of the right axilla demonstrates a markedly abnormal
lymph node with diffuse cortical thickness up to 2 cm and complete
hilar effacement.
IMPRESSION: 1. Two adjacent suspicious right breast masses at the 5 o'clock
position 6 cm from the nipple. Recommendation is for
ultrasound-guided biopsy. Both masses can likely be biopsied at the
same time.
2. Markedly abnormal right axillary lymph node. Recommendation is
for ultrasound-guided biopsy.
3. No mammographic evidence of malignancy on the left.

RECOMMENDATION:
Two area ultrasound-guided biopsy of the right breast and axilla.

I have discussed the findings and recommendations with the patient.
If applicable, a reminder letter will be sent to the patient
regarding the next appointment.

BI-RADS CATEGORY  5: Highly suggestive of malignancy.

## 2021-01-03 IMAGING — MG DIGITAL DIAGNOSTIC BILAT W/ TOMO W/ CAD
8 of 14 series · 8 of 40 positions shown · non-contrast
Comparison: Previous exam(s).

CLINICAL DATA: 54-year-old female with a palpable mass in the right
axilla for 2 months.

EXAM:
DIGITAL DIAGNOSTIC BILATERAL MAMMOGRAM WITH TOMOSYNTHESIS AND CAD;
ULTRASOUND RIGHT BREAST LIMITED
TECHNIQUE: Bilateral digital diagnostic mammography and breast tomosynthesis
was performed. The images were evaluated with computer-aided
detection.; Targeted ultrasound examination of the right breast was
performed

[R MLO synth-2D (1 of 2)]
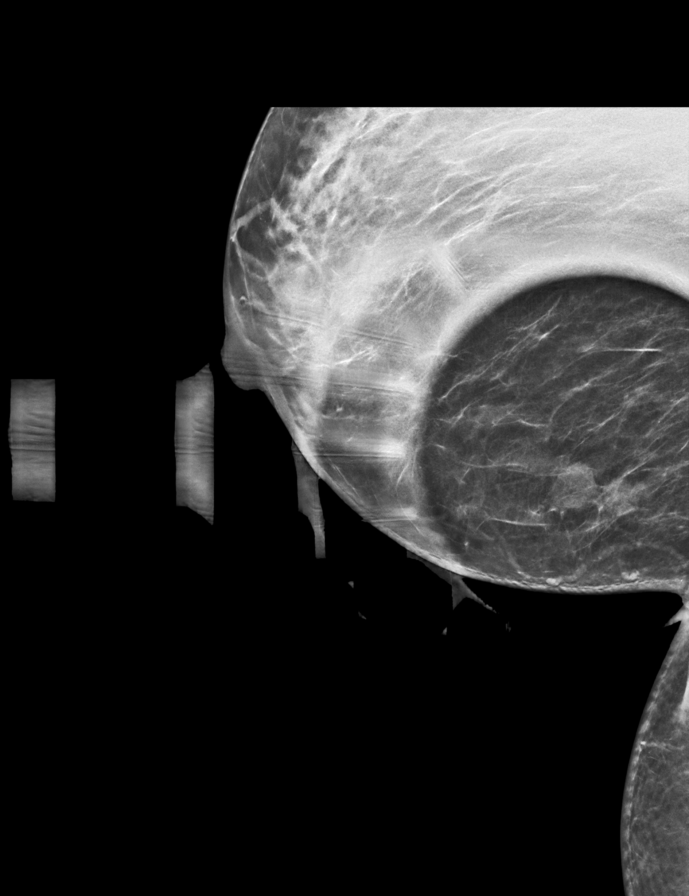

[R TAN synth-2D]
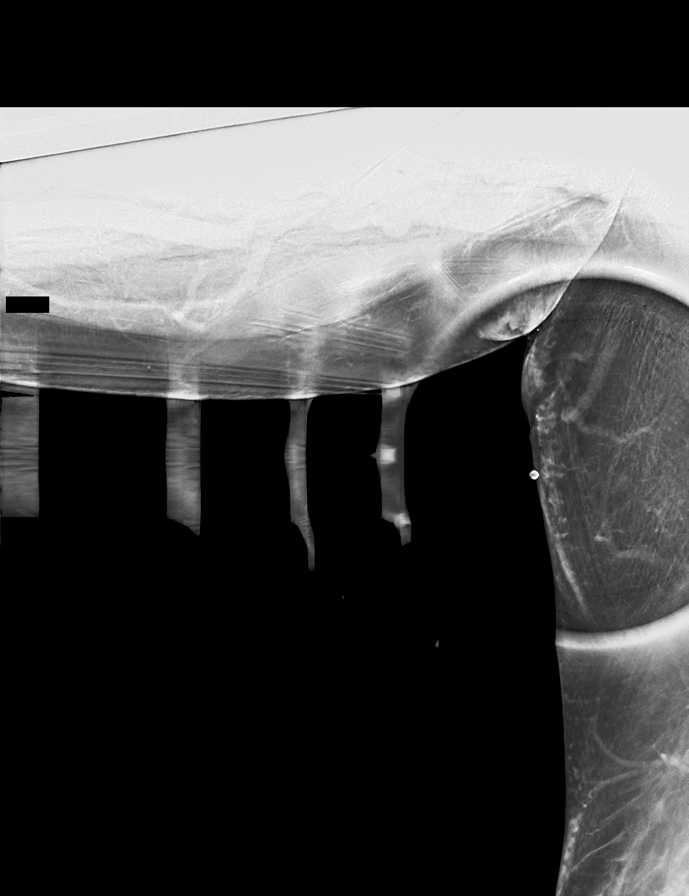

[R MLO synth-2D (2 of 2)]
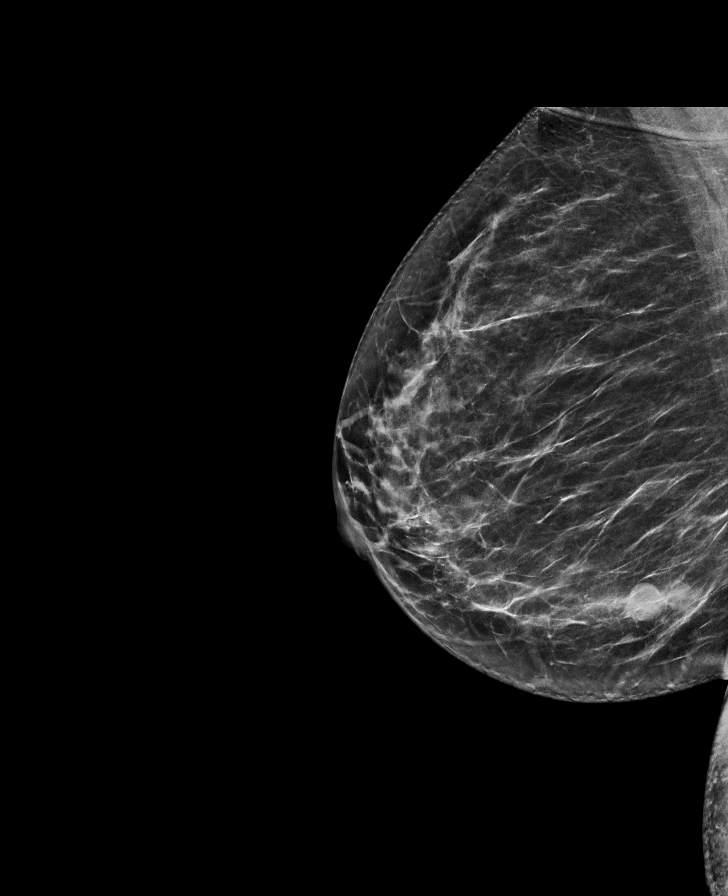

[L CC synth-2D]
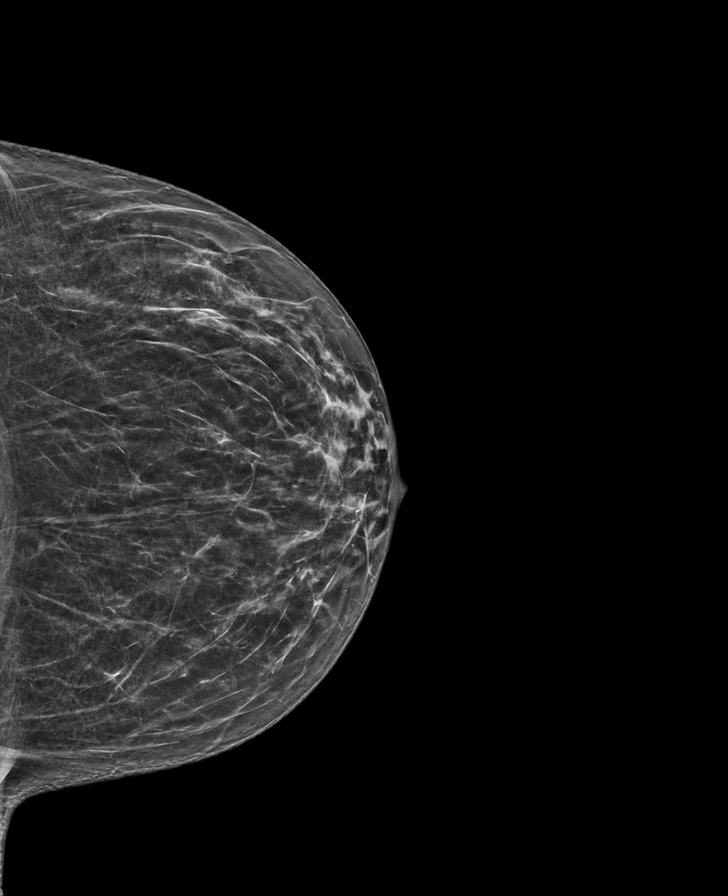

[L MLO synth-2D]
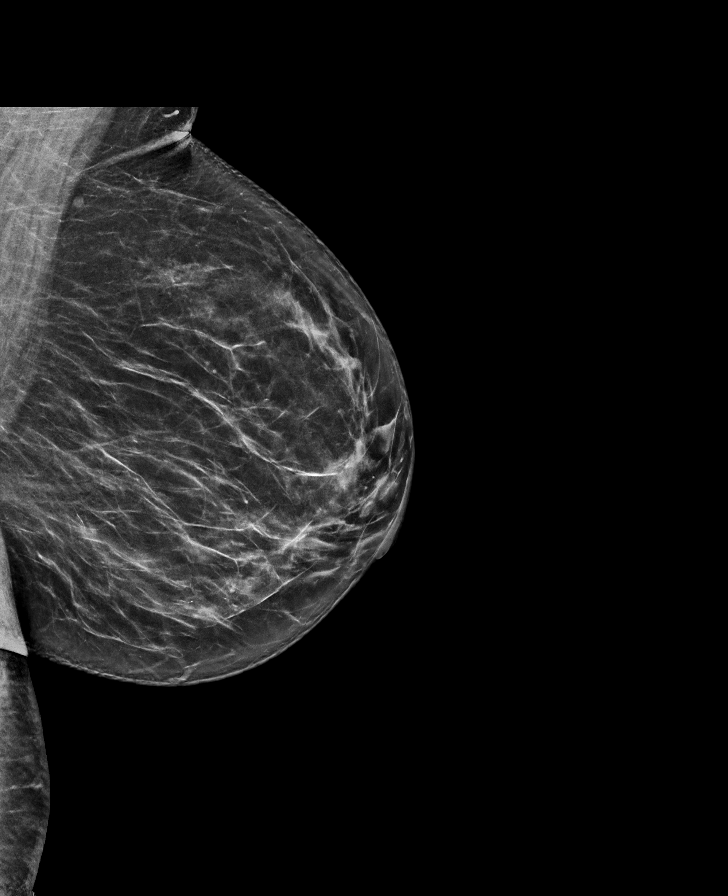

[R CC synth-2D (1 of 2)]
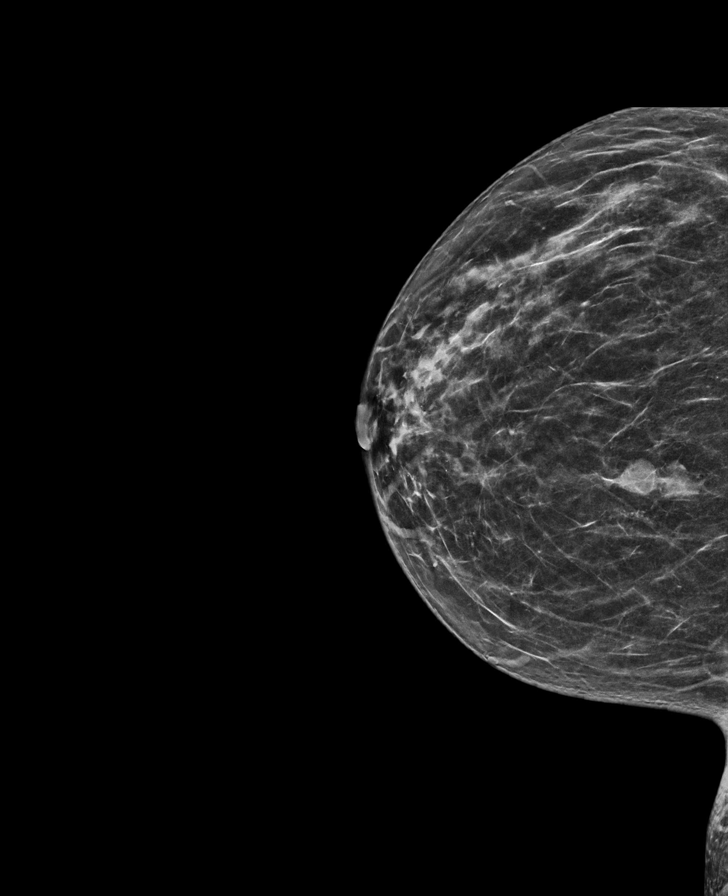

[R CC synth-2D (2 of 2)]
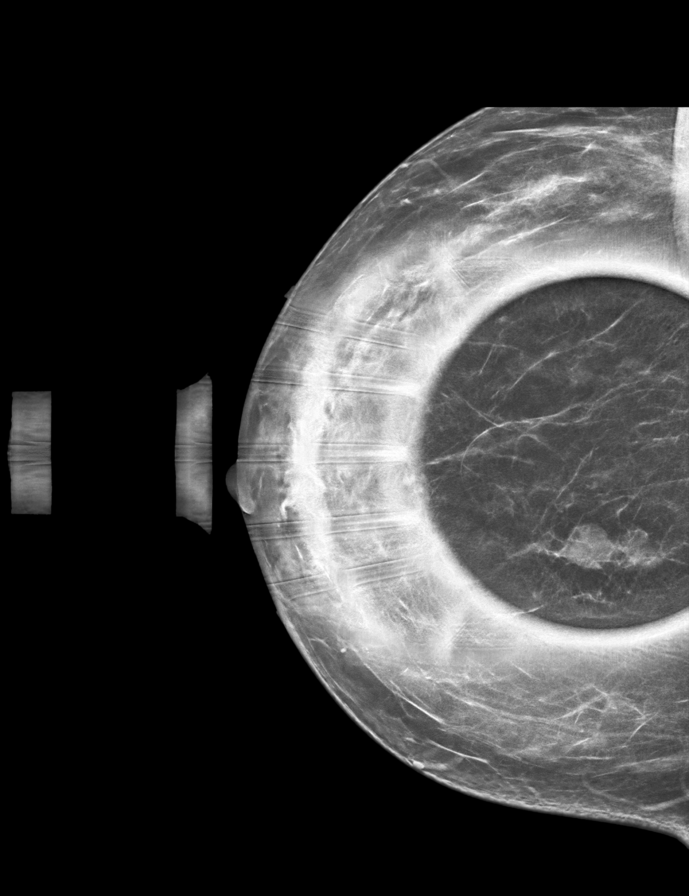

[R TAN tomo · tomo slice 33/65.0]
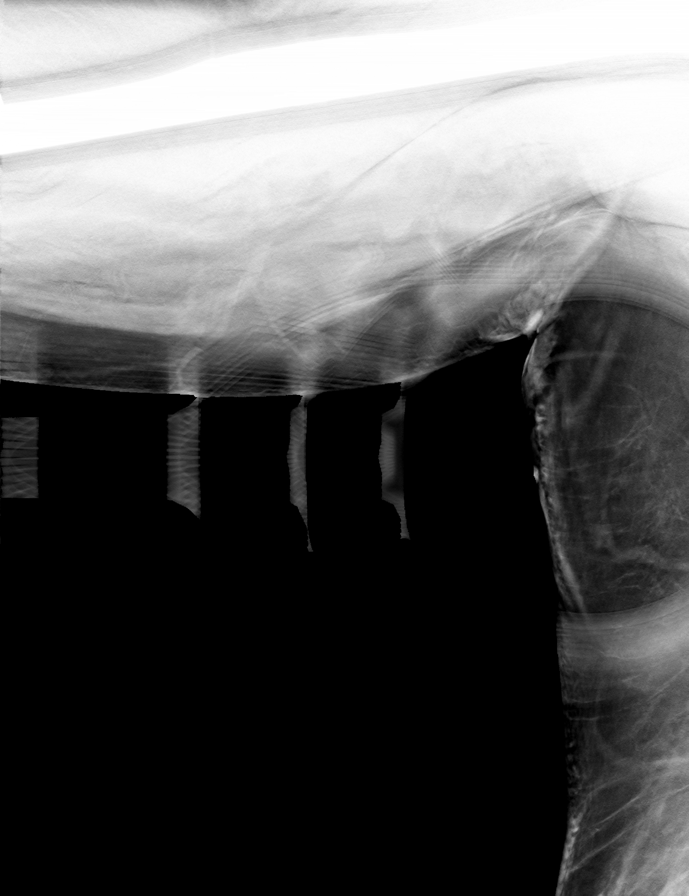

[8 of 40 positions shown; findings below may reference images not displayed]

ACR Breast Density Category b: There are scattered areas of
fibroglandular density.
FINDINGS: There is been interval development of an irregular hyperdense mass
in the inferior right breast at posterior depth. No additional
suspicious findings in the remainder of either breast. A radiopaque
BB was placed in the right axilla with no definite finding seen
mammographically.

Targeted ultrasound is performed, showing 2 adjacent hypoechoic
masses with associated vascularity at the 5 o'clock position 6 cm
from the nipple. They measure 11 x 10 x 8 mm and 10 x 9 x 6 mm. In
total this area spans approximately 2.2 cm. This correlates with the
mammographic finding.

Evaluation of the right axilla demonstrates a markedly abnormal
lymph node with diffuse cortical thickness up to 2 cm and complete
hilar effacement.
IMPRESSION: 1. Two adjacent suspicious right breast masses at the 5 o'clock
position 6 cm from the nipple. Recommendation is for
ultrasound-guided biopsy. Both masses can likely be biopsied at the
same time.
2. Markedly abnormal right axillary lymph node. Recommendation is
for ultrasound-guided biopsy.
3. No mammographic evidence of malignancy on the left.

RECOMMENDATION:
Two area ultrasound-guided biopsy of the right breast and axilla.

I have discussed the findings and recommendations with the patient.
If applicable, a reminder letter will be sent to the patient
regarding the next appointment.

BI-RADS CATEGORY  5: Highly suggestive of malignancy.

## 2021-01-04 NOTE — Progress Notes (Signed)
Please keep appointment for ultrasound biopsy with [facility] scheduled 01/06/2021 8:00 am, at the [facility].

## 2021-01-04 NOTE — Progress Notes (Signed)
Please keep appointment for ultrasound biopsy with [facility] scheduled 01/06/2021  8:00 am, at [facility].

## 2021-01-06 ENCOUNTER — Ambulatory Visit
Admission: RE | Admit: 2021-01-06 | Discharge: 2021-01-06 | Disposition: A | Discharge status: Home / Self Care | Payer: 59 | Source: Ambulatory Visit | Attending: Family | Admitting: Family

## 2021-01-06 ENCOUNTER — Other Ambulatory Visit: Payer: Self-pay

## 2021-01-06 DIAGNOSIS — N63 Unspecified lump in unspecified breast: Secondary | ICD-10-CM

## 2021-01-06 IMAGING — US US  BREAST BX W/ LOC DEV 1ST LESION IMG BX SPEC US GUIDE*R*
1 series · 12 of 25 positions shown · non-contrast
Comparison: Previous exam(s).
COMPARISON: Previous exam(s).

Addendum:
CLINICAL DATA: Patient presents for ultrasound-guided core needle
biopsy 2 adjacent suspicious masses at the 5 o'clock position of the
right breast 6 cm from the nipple measuring 2.2 cm together as well
as biopsy of an abnormal right axillary lymph node.

EXAM:
ULTRASOUND GUIDED RIGHT BREAST CORE NEEDLE BIOPSY

[Series 1: us breast bx w/ loc dev 1st lesion img bx spec us  · 0.06mm/px · 12 of 26 slices shown]
[im 2/26]
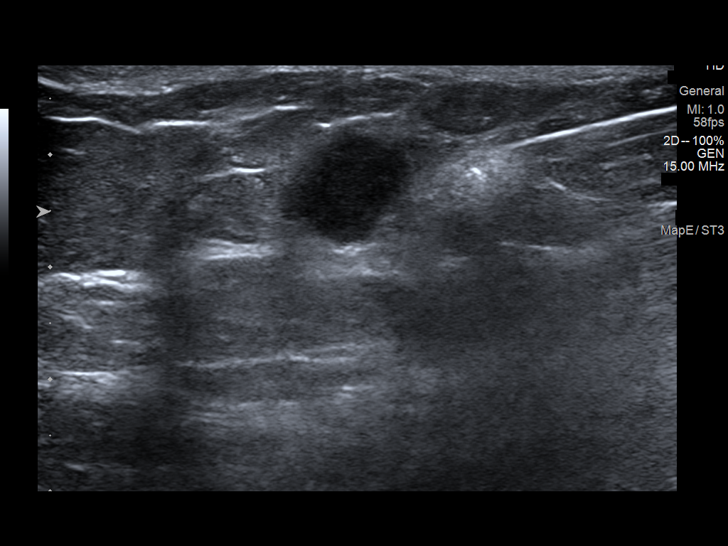
[im 4/26]
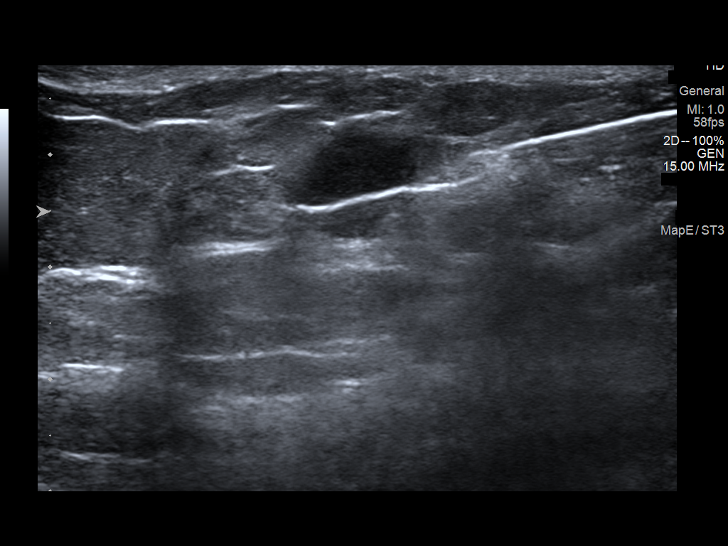
[im 6/26]
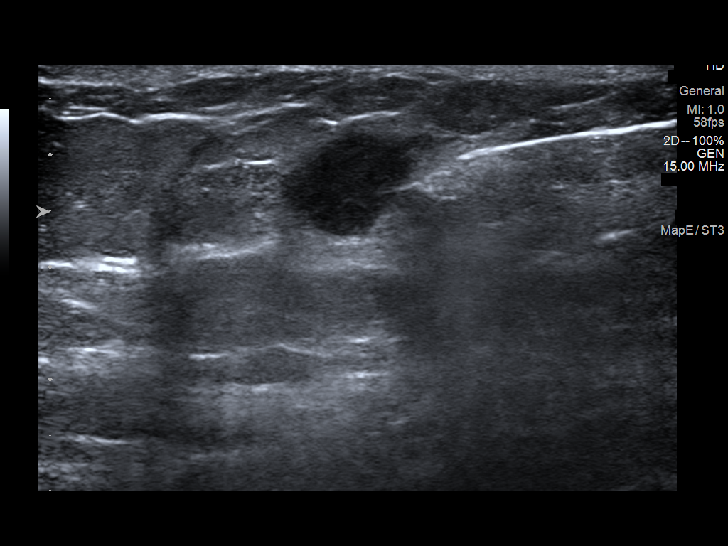
[im 8/26]
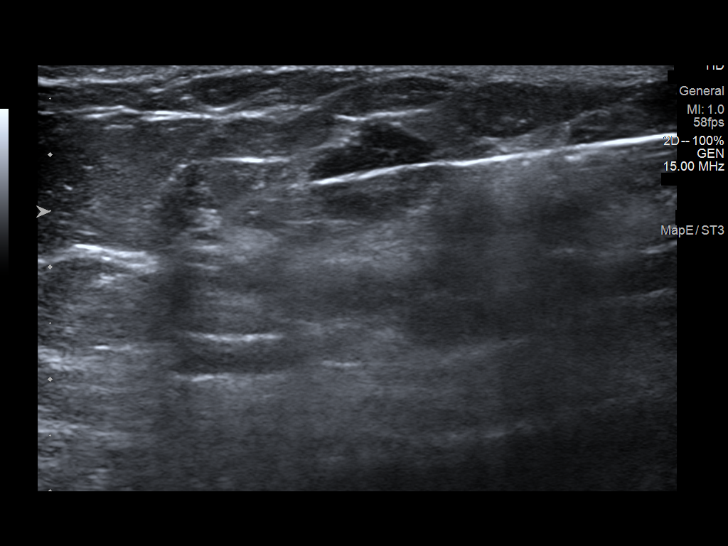
[im 10/26]
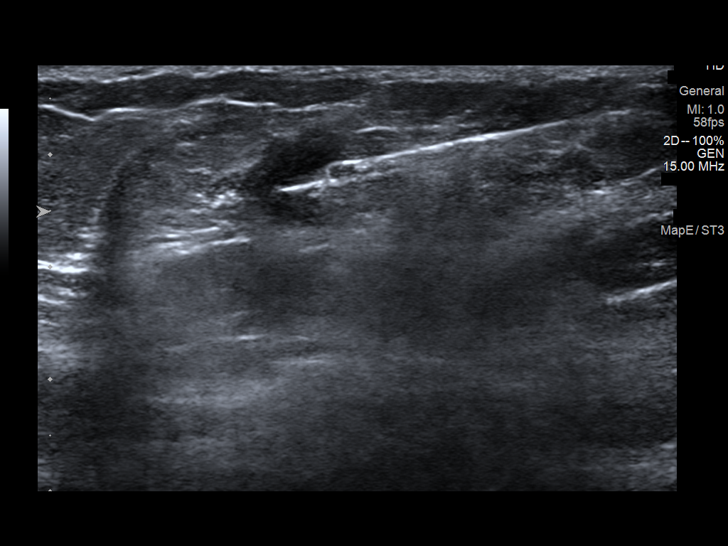
[im 12/26]
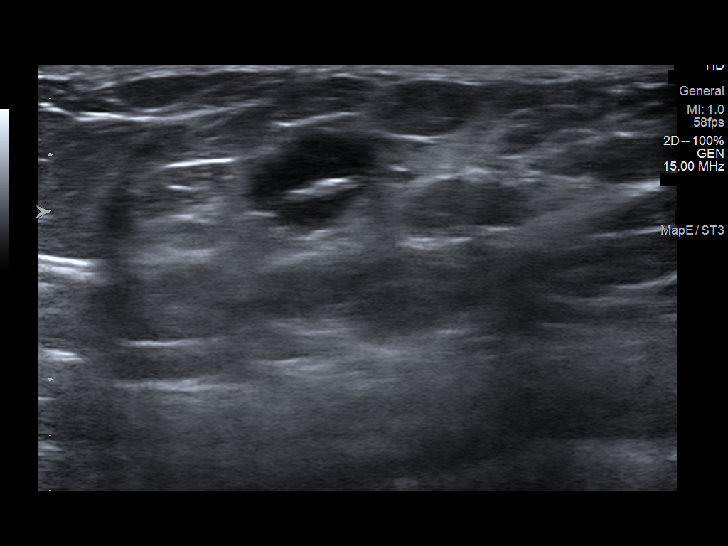
[im 14/26]
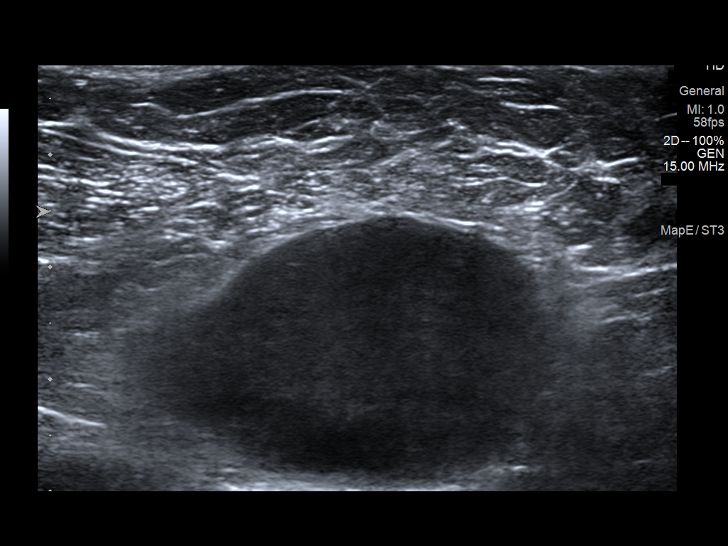
[im 16/26]
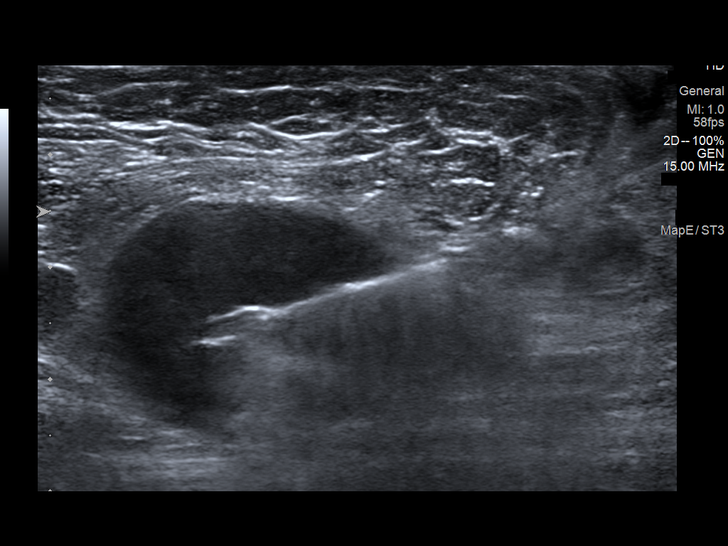
[im 18/26]
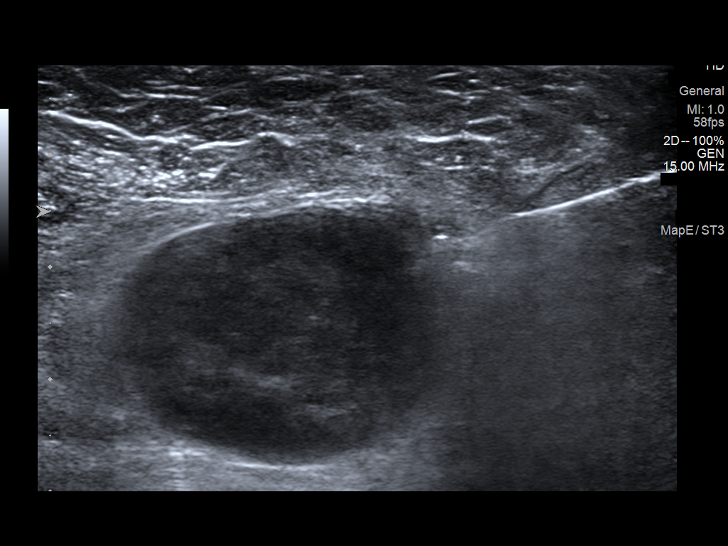
[im 20/26]
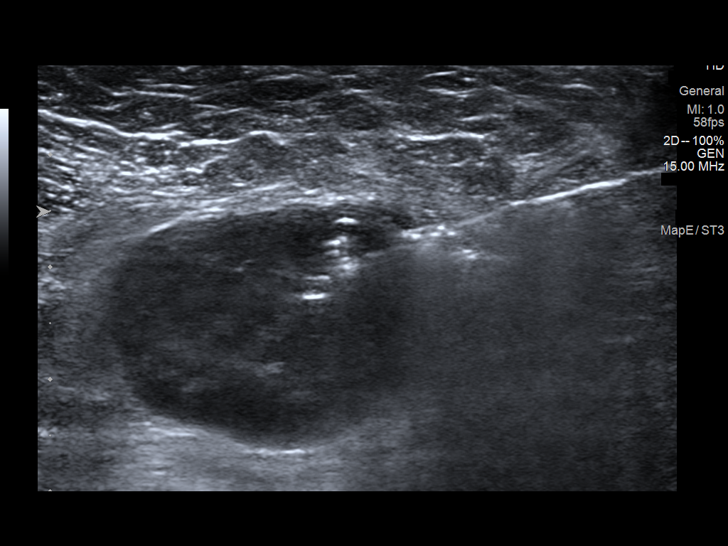
[im 22/26]
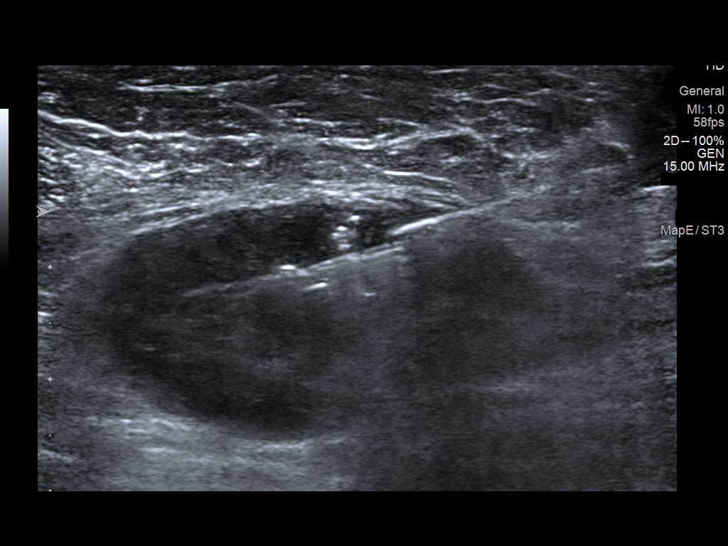
[im 24/26]
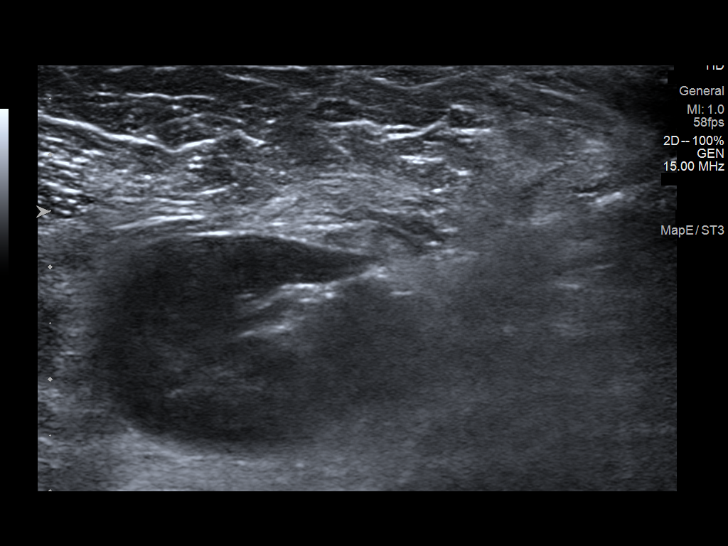

[12 of 25 positions shown; findings below may reference images not displayed]



1.  Lesion quadrant: Right inner lower quadrant.

Using sterile technique and 1% Lidocaine as local anesthetic, under
direct ultrasound visualization, a 12 gauge ANGELES device was
used to perform biopsy of the targeted suspicious mass over the 5
o'clock position of the right breast using a medial to lateral
approach. At the conclusion of the procedure ribbon shaped tissue
marker clip was deployed into the biopsy cavity. Follow up 2 view
mammogram was performed and dictated separately.

2. Lesion quadrant: Right axilla.

Using sterile technique and 1% Lidocaine as local anesthetic, under
direct ultrasound visualization, a 14 gauge ANGELES device was
used to perform biopsy of the abnormal right axillary lymph node
using a lateral to medial approach. At the conclusion of the
procedure ANGELES tissue marker clip was deployed into the biopsy
cavity. Follow up 2 view mammogram was performed and dictated
separately.
IMPRESSION: Ultrasound guided biopsy of a suspicious right breast mass at the 5
o'clock position as well as abnormal right axillary lymph node. No
apparent complications.

ADDENDUM:
Pathology revealed GRADE III INVASIVE DUCTAL CARCINOMA of the Right
breast, 5 o'clock, [VM]. This was found to be concordant by Dr.
ANGELES.

Pathology revealed METASTATIC CARCINOMA INVOLVING A LYMPH NODE of
the Right axilla. This was found to be concordant by Dr. ANGELES
ANGELES.

Pathology results were discussed with the patient by telephone. The
patient reported doing well after the biopsies with tenderness at
the sites. Post biopsy instructions and care were reviewed and
questions were answered. The patient was encouraged to call The
direct phone number was provided.

The patient was referred to [REDACTED]
[REDACTED] at [REDACTED] on
[DATE].

Pathology results reported by ANGELES, RN on [DATE].



1.  Lesion quadrant: Right inner lower quadrant.

Using sterile technique and 1% Lidocaine as local anesthetic, under
direct ultrasound visualization, a 12 gauge ANGELES device was
used to perform biopsy of the targeted suspicious mass over the 5
o'clock position of the right breast using a medial to lateral
approach. At the conclusion of the procedure ribbon shaped tissue
marker clip was deployed into the biopsy cavity. Follow up 2 view
mammogram was performed and dictated separately.

2. Lesion quadrant: Right axilla.

Using sterile technique and 1% Lidocaine as local anesthetic, under
direct ultrasound visualization, a 14 gauge ANGELES device was
used to perform biopsy of the abnormal right axillary lymph node
using a lateral to medial approach. At the conclusion of the
procedure ANGELES tissue marker clip was deployed into the biopsy
cavity. Follow up 2 view mammogram was performed and dictated
separately.
IMPRESSION: Ultrasound guided biopsy of a suspicious right breast mass at the 5
o'clock position as well as abnormal right axillary lymph node. No
apparent complications.

## 2021-01-06 IMAGING — MG MM BREAST LOCALIZATION CLIP
4 series · 4 of 12 positions shown · non-contrast
Comparison: Previous exam(s).

CLINICAL DATA: Patient is post ultrasound-guided core needle biopsy
of a suspicious mass over the 3 o'clock position of the right breast
as well as abnormal deep right axillary lymph node

EXAM:
DIAGNOSTIC RIGHT MAMMOGRAM POST ULTRASOUND BIOPSY

[R CC synth-2D]
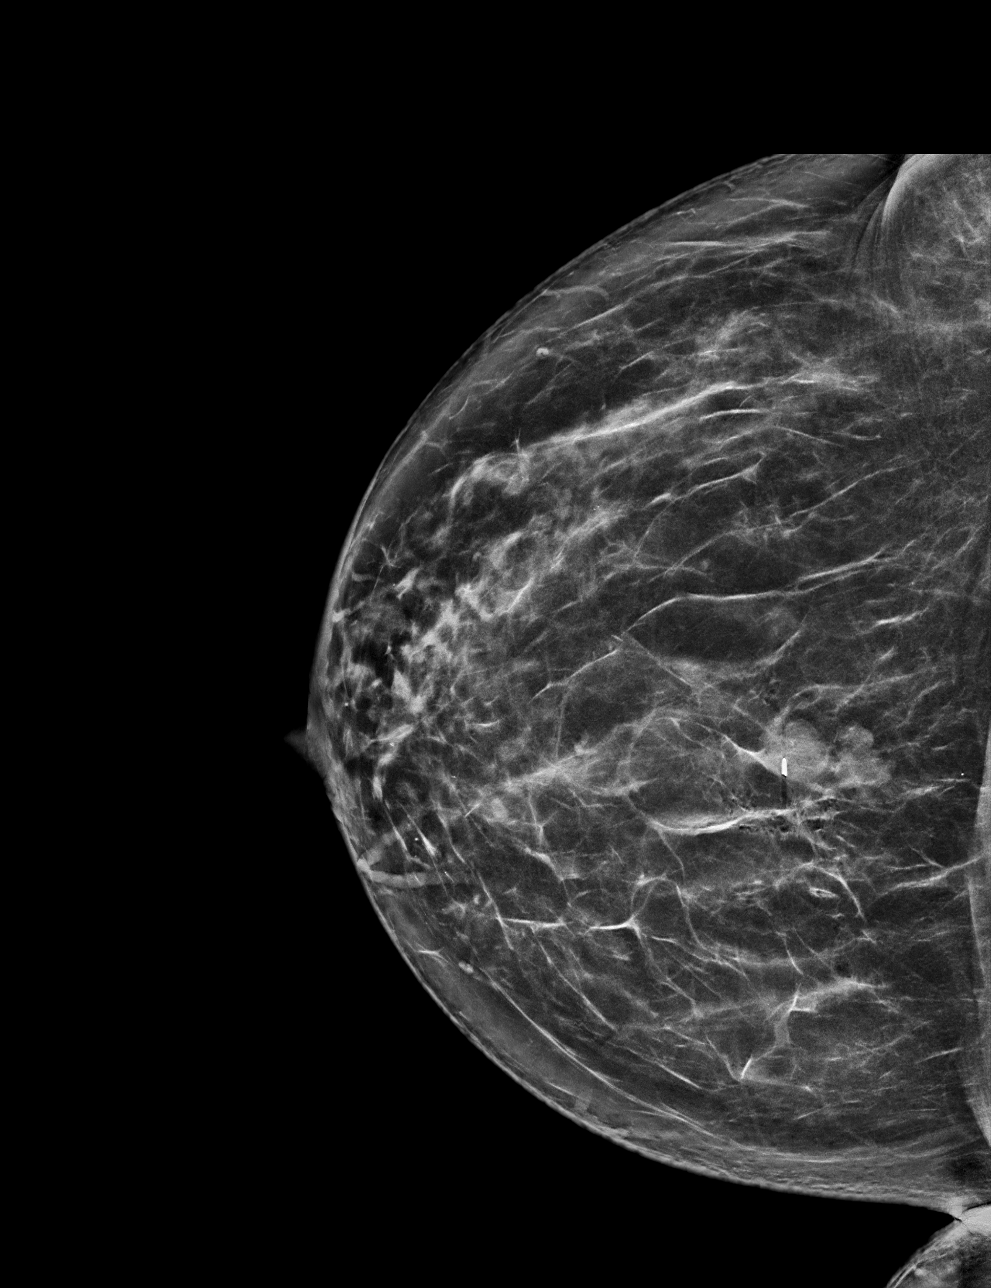

[R ML synth-2D]
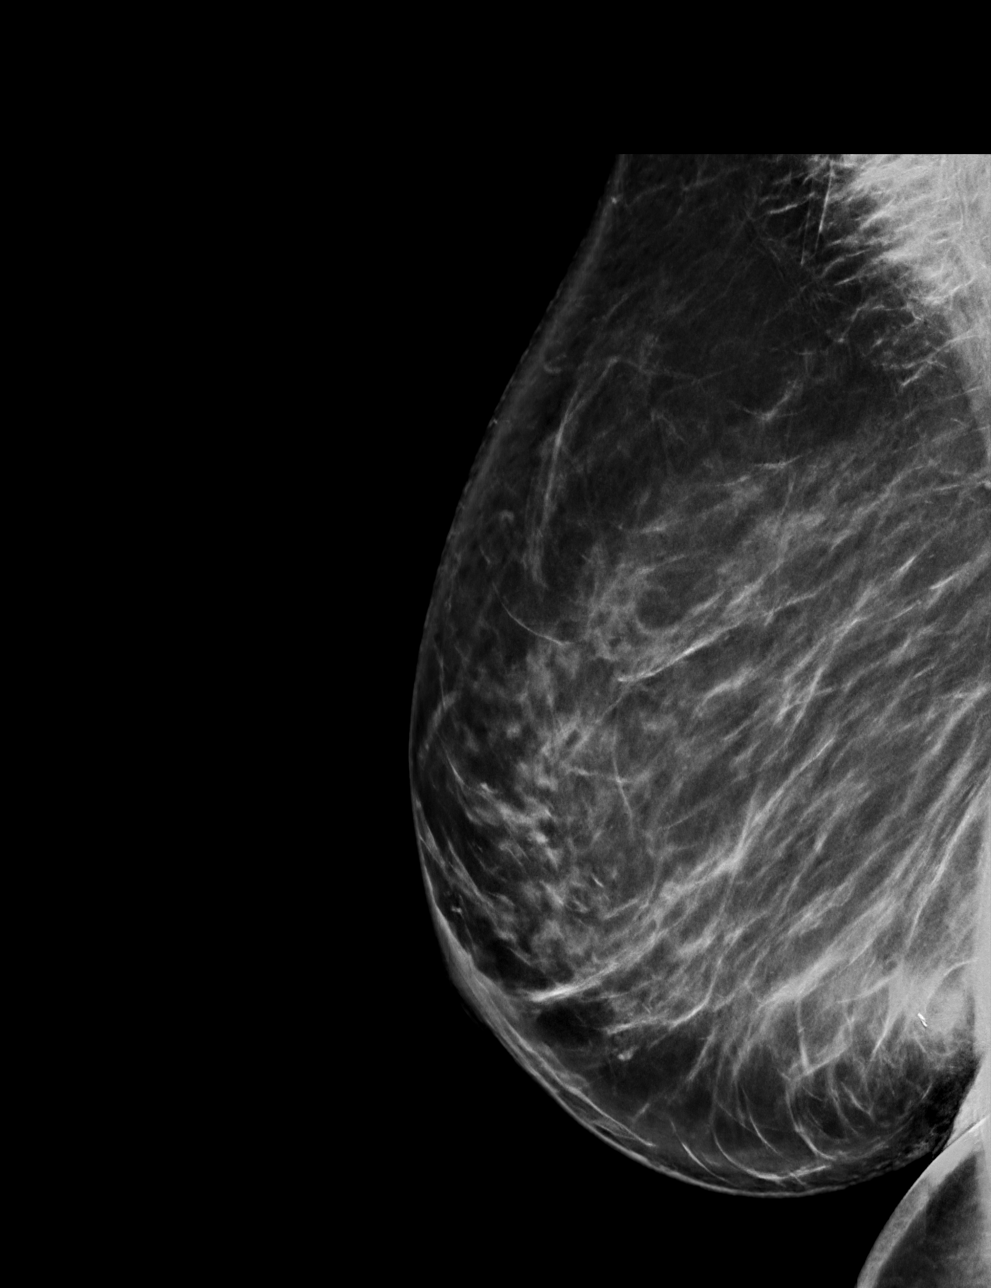

[R CC tomo · tomo slice 38/75.0]
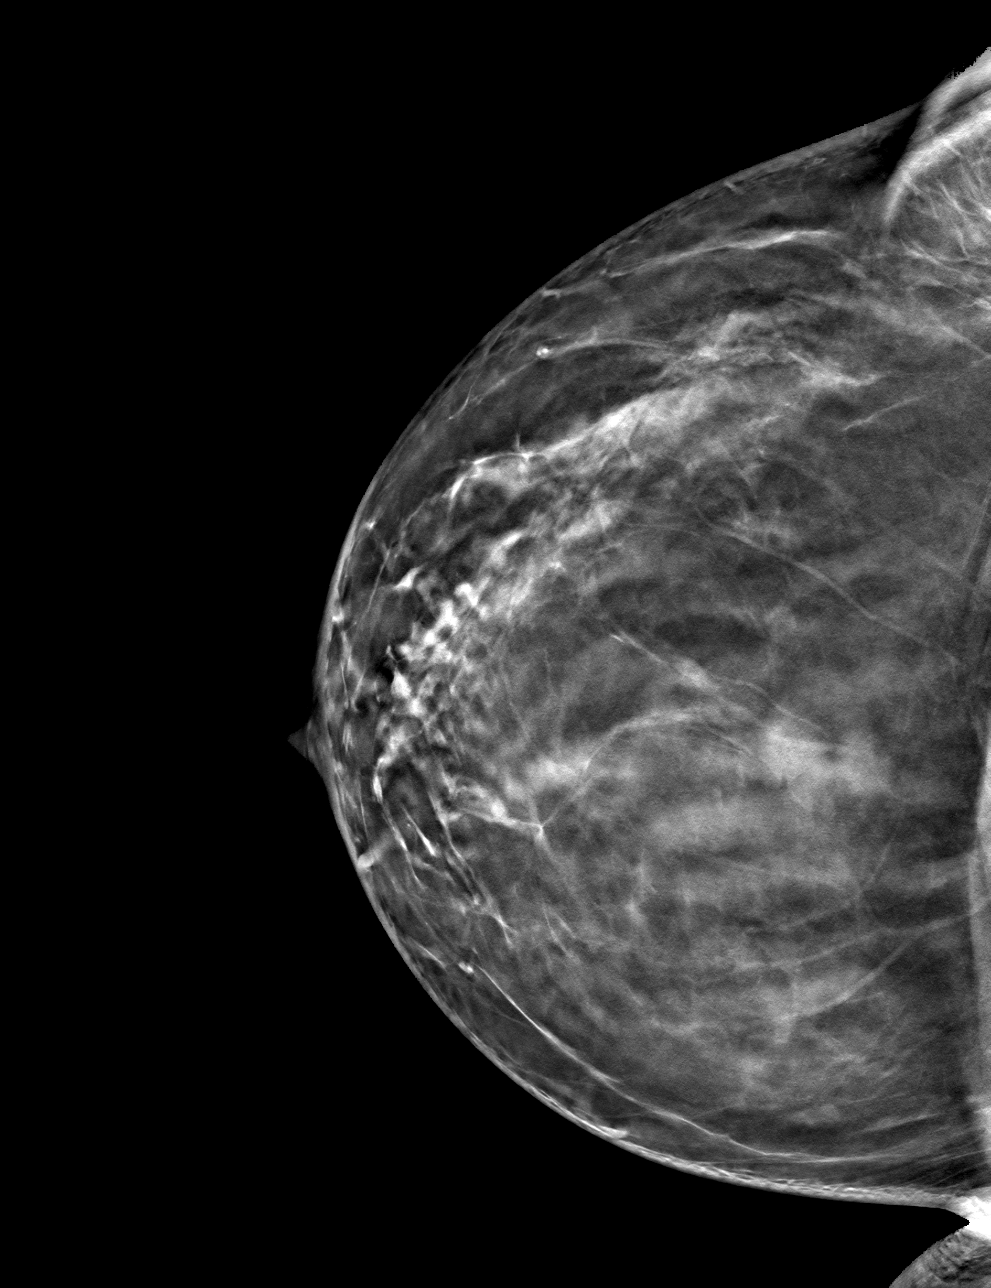

[R ML tomo · tomo slice 43/86.0]
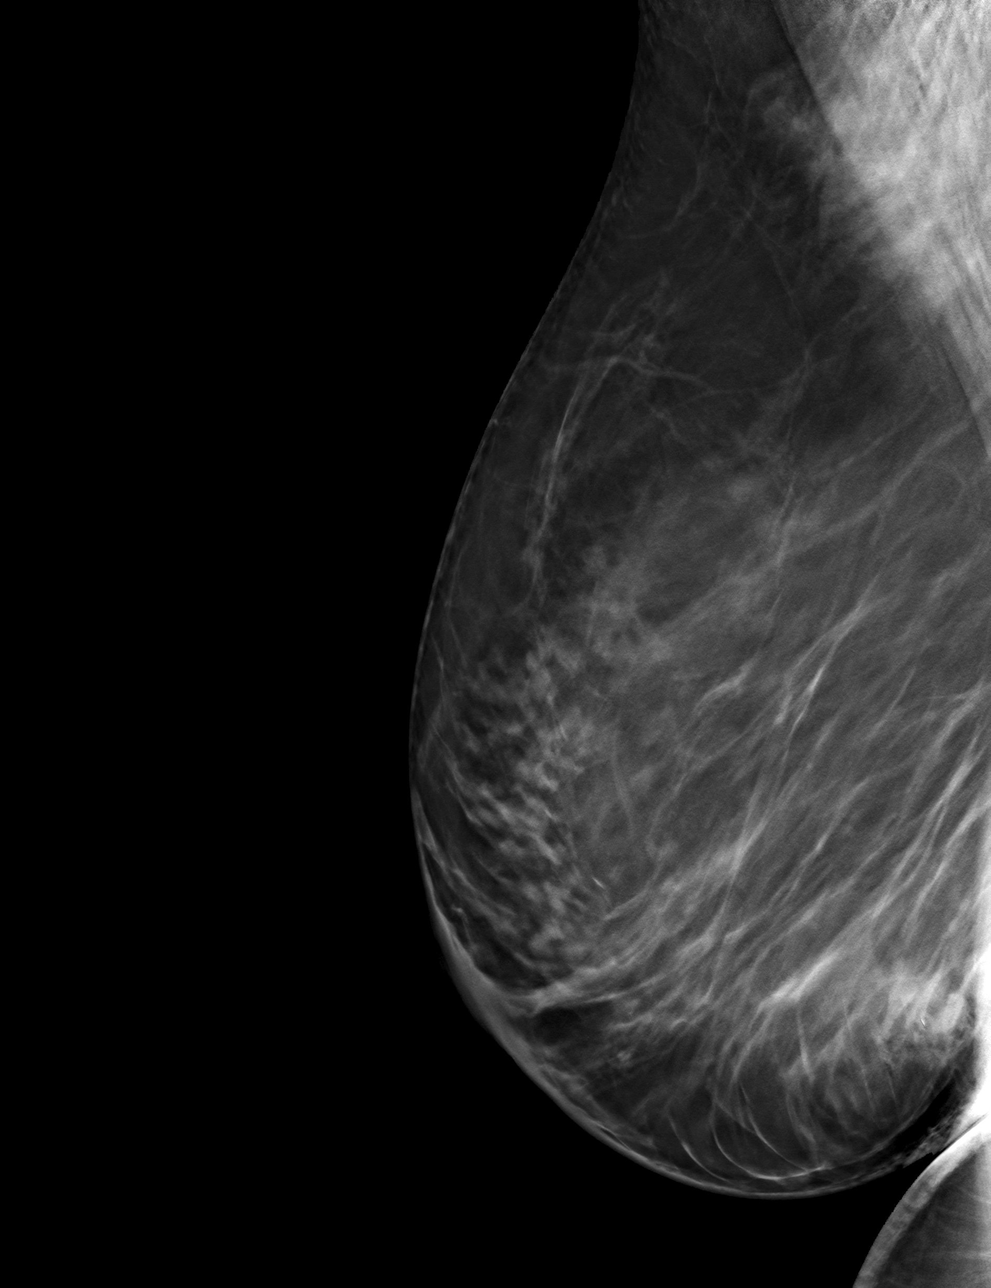

[4 of 12 positions shown; findings below may reference images not displayed]

FINDINGS: Mammographic images were obtained following ultrasound guided biopsy
of the targeted mass at the 5 o'clock position of the right breast
as well as abnormal right axillary lymph node. The biopsy marking
clip is in expected position at the site of biopsy in the posterior
lower central right breast. Biopsy clip over the axilla is too deep
to be included in the field of view.
IMPRESSION: Appropriate positioning of the ribbon shaped biopsy marking clip at
the site of biopsy in the lower central right breast.
Nonvisualization of the post biopsy clip over the right axilla.

Final Assessment: Post Procedure Mammograms for Marker Placement

## 2021-01-10 ENCOUNTER — Telehealth: Payer: Self-pay | Admitting: Oncology

## 2021-01-10 NOTE — Telephone Encounter (Signed)
Spoke to patient to confirm morning [facility] appointment for 3/30, packet emailed to patient

## 2021-01-12 ENCOUNTER — Encounter: Payer: Self-pay | Admitting: *Deleted

## 2021-01-12 DIAGNOSIS — Z170 Estrogen receptor positive status [ER+]: Secondary | ICD-10-CM | POA: Insufficient documentation

## 2021-01-12 DIAGNOSIS — C50311 Malignant neoplasm of lower-inner quadrant of right female breast: Secondary | ICD-10-CM | POA: Insufficient documentation

## 2021-01-17 NOTE — Progress Notes (Signed)
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 She underwent bilateral diagnostic mammography with tomography and right breast ultrasonography at [facility] on 01/03/2021 showing: breast density category B; two adjacent suspicious right breast masses at 5 o'clock; markedly abnormal right axillary lymph node; no evidence of malignancy on left.  Accordingly on 01/06/2021 she proceeded to biopsy of the right breast area in question. The pathology from this procedure ([id number]) showed: invasive ductal carcinoma, grade 3. Prognostic indicators significant for: estrogen receptor, 10% positive with weak staining intensity and progesterone receptor, 0% negative. Proliferation marker Ki67 at 90%. HER2 equivocal by immunohistochemistry (2+), but negative by fluorescent in situ hybridization with a signals ratio 1.38 and number per cell 3.63.  Cancer Staging Malignant neoplasm of lower-inner quadrant of right breast of female, estrogen receptor positive ([place]) Staging form: Breast, AJCC 8th Edition - Clinical: Stage IIB (cT1c, cN1, cM0, G3, ER+, PR-, HER2-) - Unsigned Stage prefix: Initial diagnosis Histologic grading system: 3 grade system   [name] is as detailed below.   INTERVAL  HISTORY: Rebecca [name] was evaluated in the multidisciplinary breast cancer clinic on 01/18/2021 accompanied by a friend, Rebecca [name] and an interpreter. Her case was also presented at the multidisciplinary breast cancer conference on the same day. At that time a preliminary plan was proposed: Neoadjuvant chemotherapy followed by definitive surgery, adjuvant radiation, and genetics testing   REVIEW OF SYSTEMS: On the provided questionnaire, Rebecca [name] reports weight change, change in vision, dental problems, palpable breast lump, arthritis, and diabetes. The patient denies unusual headaches, nausea, vomiting, stiff neck, dizziness, or gait imbalance. There has been no cough, phlegm production, or pleurisy, no chest pain or pressure, and no change in bowel or bladder habits. The patient denies fever, rash, bleeding, or unexplained fatigue. A detailed review of systems was otherwise entirely negative.   COVID 19 VACCINATION STATUS: [place] x2, most recently 07/2020   PAST MEDICAL HISTORY: Past Medical History:  Diagnosis Date  . Arthritis   . Breast cancer ([place])   . Diabetes mellitus   . Hyperlipidemia   . Hypertension     PAST SURGICAL HISTORY: Past Surgical History:  Procedure Laterality Date  . CESAREAN SECTION      FAMILY HISTORY: Family History  Problem Relation Age of Onset  . Diabetes Mother   . Heart disease Mother   . Diabetes Father   . Cancer Father 104       Throat  . Diabetes Sister   . Diabetes Brother    Her father died in his 40's and her mother in her 41'D, both from complications of diabetes. Rebecca [name] has 6 brothers and 1 sister. There is no family history of breast or ovarian cancer to her knowledge.   GYNECOLOGIC HISTORY:  No LMP recorded. Menarche: 55 years old [place] P 0 LMP 10/31/2020 (before that, 06/2020) Contraceptive used from 2017-2018 HRT n/a  Hysterectomy?  no BSO? no   SOCIAL HISTORY: (updated 12/2020)  Rebecca [name] is originally from [place]. 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 She is divorced. She lives at home with her nephew Rebecca [name], who is 67 and works for [employer]. She attends [employer] ([employer]).    ADVANCED DIRECTIVES: not in place; at the 01/18/2021 visit she stated her ex-husband Rebecca [name], who is disabled due to past injuries [phone] , would be the person to contact if she became incapacitated [the patient states aside from her nephew she has no other relatives in [place]: Social History   Tobacco Use  . Smoking status: Passive Smoke Exposure - Never Smoker  . Smokeless tobacco: Never Used  Substance Use Topics  . Alcohol use: No  . Drug use: No     Colonoscopy: never done, referral in place  PAP: 12/2020, negative  Bone density: never done   Allergies  Allergen Reactions  . Lisinopril Swelling    Swelling in throat, coughing, difficulty swallowing, could not sleep. Occurred Dec 2020.    Current Outpatient Medications  Medication Sig Dispense Refill  . amLODipine (NORVASC) 10 MG tablet Take 1 tablet (10 mg total) by mouth daily. 90 tablet 1  . atorvastatin (LIPITOR) 80 MG tablet TAKE 1 TABLET (80 MG TOTAL) BY MOUTH DAILY. 90 tablet 1  . Blood Glucose Monitoring Suppl (ONETOUCH VERIO REFLECT) w/Device KIT 1 kit by Does not apply route in the morning, at noon, and at bedtime. Use as directed to test blood sugar up to three times daily.E11.9 1 kit 0  . cetirizine (ZYRTEC) 10 MG tablet Take 1 tablet (10 mg total) by mouth daily. 30 tablet 0  . cloNIDine (CATAPRES) 0.1 MG tablet Take 1 tablet (0.1 mg total) by mouth at bedtime as needed. 90 tablet 1  . clotrimazole (CLOTRIMAZOLE AF) 1 % cream Apply 1 application topically 2 (two) times daily. 30 g 1  . dapagliflozin propanediol (FARXIGA) 10 MG TABS tablet Take 1 tablet (10 mg total) by mouth daily before breakfast. 90 tablet 1  . ergocalciferol (DRISDOL) 1.25 MG (50000  UT) capsule Take 1 capsule (50,000 Units total) by mouth once a week. 12 capsule 0  . glipiZIDE (GLUCOTROL) 5 MG tablet TAKE 1 TABLET BY MOUTH DAILY 90 tablet 1  . glucose blood (ONETOUCH VERIO) test strip Use as directed to test blood sugar up to three times daily. E11.9 100 each 6  . Lancets (ONETOUCH DELICA PLUS [id number]) MISC 1 Device by Does not apply route in the morning, at noon, and at bedtime. Use as directed to test blood sugar up to three times daily. E11.9 100 each 6  . metFORMIN (GLUCOPHAGE) 500 MG tablet TAKE 1 TABLET BY MOUTH TWICE DAILY 180 tablet 0  . naproxen (NAPROSYN) 500 MG tablet Take 1 tablet (500 mg total) by mouth 2 (two) times daily with a meal. 30 tablet 1  . omeprazole (PRILOSEC) 20 MG capsule Take 1 capsule (20 mg total) by mouth daily. 30 capsule 0  . polyethylene glycol (MIRALAX MIX-IN PAX) 17 g packet Take 17 g by mouth daily. 14 each 0   No current facility-administered medications for this visit.    OBJECTIVE: [place] woman in no acute distress  Vitals:   01/18/21 0845  BP: 135/79  Pulse: 77  Resp: 18  Temp: 98.1 F (36.7 C)  SpO2: 100%     Body mass index is 24.73  kg/m.   Wt Readings from Last 3 Encounters:  01/18/21 139 lb 9.6 oz (63.3 kg)  12/27/20 148 lb (67.1 kg)  11/14/20 143 lb 12.8 oz (65.2 kg)      ECOG FS:1 - Symptomatic but completely ambulatory  Ocular: Sclerae unicteric, pupils round and equal Ear-nose-throat: Wearing a mask Lymphatic: No cervical or supraclavicular adenopathy Lungs no rales or rhonchi Heart regular rate and rhythm Abd soft, nontender, positive bowel sounds MSK no focal spinal tenderness, no joint edema Neuro: non-focal, well-oriented, appropriate affect Breasts: The right breast is status post recent biopsy.  There is a moderate ecchymosis.  There is an easily palpable mass in the right axilla, which measures at least 2 cm.  The left breast and left axilla are benign.   LAB RESULTS:  CMP     Component  Value Date/Time   NA 141 01/18/2021 0810   NA 140 12/27/2020 1123   K 3.9 01/18/2021 0810   CL 102 01/18/2021 0810   CO2 26 01/18/2021 0810   GLUCOSE 111 (H) 01/18/2021 0810   BUN 9 01/18/2021 0810   BUN 11 12/27/2020 1123   CREATININE 0.67 01/18/2021 0810   CREATININE 0.50 11/02/2016 1102   CALCIUM 9.2 01/18/2021 0810   PROT 7.7 01/18/2021 0810   PROT 7.5 12/27/2020 1123   ALBUMIN 4.1 01/18/2021 0810   ALBUMIN 4.7 12/27/2020 1123   AST 24 01/18/2021 0810   ALT 20 01/18/2021 0810   ALKPHOS 64 01/18/2021 0810   BILITOT 0.4 01/18/2021 0810   GFRNONAA >60 01/18/2021 0810   GFRNONAA >89 01/16/2016 1006   GFRAA 118 06/02/2020 0942   GFRAA >89 01/16/2016 1006    No results found for: TOTALPROTELP, ALBUMINELP, A1GS, A2GS, BETS, BETA2SER, GAMS, MSPIKE, SPEI  Lab Results  Component Value Date   WBC 7.2 01/18/2021   NEUTROABS 2.3 01/18/2021   HGB 13.3 01/18/2021   HCT 41.2 01/18/2021   MCV 81.3 01/18/2021   PLT 247 01/18/2021    No results found for: LABCA2  No components found for: [id number]  No results for input(s): INR in the last 168 hours.  No results found for: LABCA2  No results found for: ZJI967  No results found for: ELF810  No results found for: FBP102  No results found for: CA2729  No components found for: HGQUANT  No results found for: CEA1 / No results found for: CEA1   No results found for: AFPTUMOR  No results found for: CHROMOGRNA  No results found for: KPAFRELGTCHN, LAMBDASER, KAPLAMBRATIO (kappa/lambda light chains)  No results found for: HGBA, HGBA2QUANT, HGBFQUANT, HGBSQUAN (Hemoglobinopathy evaluation)   No results found for: LDH  No results found for: IRON, TIBC, IRONPCTSAT (Iron and TIBC)  No results found for: FERRITIN  Urinalysis    Component Value Date/Time   COLORURINE YELLOW 06/24/2007 0116   APPEARANCEUR CLOUDY (A) 06/24/2007 0116   LABSPEC 1.025 09/18/2020 1125   PHURINE 5.5 09/18/2020 1125   GLUCOSEU 100 (A)  09/18/2020 1125   HGBUR TRACE (A) 09/18/2020 1125   BILIRUBINUR NEGATIVE 09/18/2020 1125   KETONESUR NEGATIVE 09/18/2020 1125   PROTEINUR NEGATIVE 09/18/2020 1125   UROBILINOGEN 0.2 09/18/2020 1125   NITRITE NEGATIVE 09/18/2020 1125   LEUKOCYTESUR NEGATIVE 09/18/2020 1125     STUDIES: US BREAST LTD UNI RIGHT INC AXILLA  Result Date: 01/03/2021 CLINICAL DATA:  55 year old female with a palpable mass in the right axilla for 2 months. EXAM: DIGITAL DIAGNOSTIC BILATERAL MAMMOGRAM WITH TOMOSYNTHESIS AND CAD; ULTRASOUND RIGHT BREAST LIMITED TECHNIQUE: Bilateral  digital diagnostic mammography and breast tomosynthesis was performed. The images were evaluated with computer-aided detection.; Targeted ultrasound examination of the right breast was performed COMPARISON:  Previous exam(s). ACR Breast Density Category b: There are scattered areas of fibroglandular density. FINDINGS: There is been interval development of an irregular hyperdense mass in the inferior right breast at posterior depth. No additional suspicious findings in the remainder of either breast. A radiopaque BB was placed in the right axilla with no definite finding seen mammographically. Targeted ultrasound is performed, showing 2 adjacent hypoechoic masses with associated vascularity at the 5 o'clock position 6 cm from the nipple. They measure 11 x 10 x 8 mm and 10 x 9 x 6 mm. In total this area spans approximately 2.2 cm. This correlates with the mammographic finding. Evaluation of the right axilla demonstrates a markedly abnormal lymph node with diffuse cortical thickness up to 2 cm and complete hilar effacement. IMPRESSION: 1. Two adjacent suspicious right breast masses at the 5 o'clock position 6 cm from the nipple. Recommendation is for ultrasound-guided biopsy. Both masses can likely be biopsied at the same time. 2. Markedly abnormal right axillary lymph node. Recommendation is for ultrasound-guided biopsy. 3. No mammographic evidence of  malignancy on the left. RECOMMENDATION: Two area ultrasound-guided biopsy of the right breast and axilla. I have discussed the findings and recommendations with the patient. If applicable, a reminder letter will be sent to the patient regarding the next appointment. BI-RADS CATEGORY  5: Highly suggestive of malignancy. Electronically Signed   By: Kristopher Oppenheim M.D.   On: 01/03/2021 16:04   MM DIAG BREAST TOMO BILATERAL  Result Date: 01/03/2021 CLINICAL DATA:  55 year old female with a palpable mass in the right axilla for 2 months. EXAM: DIGITAL DIAGNOSTIC BILATERAL MAMMOGRAM WITH TOMOSYNTHESIS AND CAD; ULTRASOUND RIGHT BREAST LIMITED TECHNIQUE: Bilateral digital diagnostic mammography and breast tomosynthesis was performed. The images were evaluated with computer-aided detection.; Targeted ultrasound examination of the right breast was performed COMPARISON:  Previous exam(s). ACR Breast Density Category b: There are scattered areas of fibroglandular density. FINDINGS: There is been interval development of an irregular hyperdense mass in the inferior right breast at posterior depth. No additional suspicious findings in the remainder of either breast. A radiopaque BB was placed in the right axilla with no definite finding seen mammographically. Targeted ultrasound is performed, showing 2 adjacent hypoechoic masses with associated vascularity at the 5 o'clock position 6 cm from the nipple. They measure 11 x 10 x 8 mm and 10 x 9 x 6 mm. In total this area spans approximately 2.2 cm. This correlates with the mammographic finding. Evaluation of the right axilla demonstrates a markedly abnormal lymph node with diffuse cortical thickness up to 2 cm and complete hilar effacement. IMPRESSION: 1. Two adjacent suspicious right breast masses at the 5 o'clock position 6 cm from the nipple. Recommendation is for ultrasound-guided biopsy. Both masses can likely be biopsied at the same time. 2. Markedly abnormal right axillary  lymph node. Recommendation is for ultrasound-guided biopsy. 3. No mammographic evidence of malignancy on the left. RECOMMENDATION: Two area ultrasound-guided biopsy of the right breast and axilla. I have discussed the findings and recommendations with the patient. If applicable, a reminder letter will be sent to the patient regarding the next appointment. BI-RADS CATEGORY  5: Highly suggestive of malignancy. Electronically Signed   By: Kristopher Oppenheim M.D.   On: 01/03/2021 16:04   [place] AXILLARY NODE CORE BIOPSY RIGHT  Addendum Date: 01/09/2021   ADDENDUM REPORT:  01/09/2021 14:15 ADDENDUM: Pathology revealed GRADE III INVASIVE DUCTAL CARCINOMA of the Right breast, 5 o'clock, 6cmfn. This was found to be concordant by Dr. Marin Olp. Pathology revealed METASTATIC CARCINOMA INVOLVING A LYMPH NODE of the Right axilla. This was found to be concordant by Dr. Marin Olp. Pathology results were discussed with the patient by telephone. The patient reported doing well after the biopsies with tenderness at the sites. Post biopsy instructions and care were reviewed and questions were answered. The patient was encouraged to call [facility] for any additional concerns. My direct phone number was provided. The patient was referred to [facility] on January 18, 2021. Pathology results reported by Terie Purser, RN on 01/09/2021. Electronically Signed   By: Marin Olp M.D.   On: 01/09/2021 14:15   Result Date: 01/09/2021 CLINICAL DATA:  Patient presents for ultrasound-guided core needle biopsy 2 adjacent suspicious masses at the 5 o'clock position of the right breast 6 cm from the nipple measuring 2.2 cm together as well as biopsy of an abnormal right axillary lymph node. EXAM: ULTRASOUND GUIDED RIGHT BREAST CORE NEEDLE BIOPSY COMPARISON:  Previous exam(s). PROCEDURE: I met with the patient and we discussed the procedure of  ultrasound-guided biopsy, including benefits and alternatives. We discussed the high likelihood of a successful procedure. We discussed the risks of the procedure, including infection, bleeding, tissue injury, clip migration, and inadequate sampling. Informed written consent was given. The usual time-out protocol was performed immediately prior to the procedure. 1.  Lesion quadrant: Right inner lower quadrant. Using sterile technique and 1% Lidocaine as local anesthetic, under direct ultrasound visualization, a 12 gauge spring-loaded device was used to perform biopsy of the targeted suspicious mass over the 5 o'clock position of the right breast using a medial to lateral approach. At the conclusion of the procedure ribbon shaped tissue marker clip was deployed into the biopsy cavity. Follow up 2 view mammogram was performed and dictated separately. 2. Lesion quadrant: Right axilla. Using sterile technique and 1% Lidocaine as local anesthetic, under direct ultrasound visualization, a 14 gauge spring-loaded device was used to perform biopsy of the abnormal right axillary lymph node using a lateral to medial approach. At the conclusion of the procedure tri bell tissue marker clip was deployed into the biopsy cavity. Follow up 2 view mammogram was performed and dictated separately. IMPRESSION: Ultrasound guided biopsy of a suspicious right breast mass at the 5 o'clock position as well as abnormal right axillary lymph node. No apparent complications. Electronically Signed: By: Marin Olp M.D. On: 01/06/2021 08:31   MM CLIP PLACEMENT RIGHT  Result Date: 01/06/2021 CLINICAL DATA:  Patient is post ultrasound-guided core needle biopsy of a suspicious mass over the 3 o'clock position of the right breast as well as abnormal deep right axillary lymph node EXAM: DIAGNOSTIC RIGHT MAMMOGRAM POST ULTRASOUND BIOPSY COMPARISON:  Previous exam(s). FINDINGS: Mammographic images were obtained following ultrasound guided biopsy of  the targeted mass at the 5 o'clock position of the right breast as well as abnormal right axillary lymph node. The biopsy marking clip is in expected position at the site of biopsy in the posterior lower central right breast. Biopsy clip over the axilla is too deep to be included in the field of view. IMPRESSION: Appropriate positioning of the ribbon shaped biopsy marking clip at the site of biopsy in the lower central right breast. Nonvisualization of the post biopsy clip over the  right axilla. Final Assessment: Post Procedure Mammograms for Marker Placement Electronically Signed   By: Marin Olp M.D.   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 This was found to be concordant by Dr. Marin Olp. Pathology revealed METASTATIC CARCINOMA INVOLVING A LYMPH NODE of the Right axilla. This was found to be concordant by Dr. Marin Olp. Pathology results were discussed with the patient by telephone. The patient reported doing well after the biopsies with tenderness at the sites. Post biopsy instructions and care were reviewed and questions were answered. The patient was encouraged to call [facility] for any additional concerns. My direct phone number was provided. The patient was referred to [facility] on January 18, 2021. Pathology results reported by Terie Purser, RN on 01/09/2021. Electronically Signed   By: Marin Olp M.D.   On: 01/09/2021 14:15   Result Date: 01/09/2021 CLINICAL DATA:  Patient presents for ultrasound-guided core needle biopsy 2 adjacent suspicious masses at the 5 o'clock position of the right breast 6 cm from the nipple measuring 2.2 cm together as well as biopsy of an abnormal right  axillary lymph node. EXAM: ULTRASOUND GUIDED RIGHT BREAST CORE NEEDLE BIOPSY COMPARISON:  Previous exam(s). PROCEDURE: I met with the patient and we discussed the procedure of ultrasound-guided biopsy, including benefits and alternatives. We discussed the high likelihood of a successful procedure. We discussed the risks of the procedure, including infection, bleeding, tissue injury, clip migration, and inadequate sampling. Informed written consent was given. The usual time-out protocol was performed immediately prior to the procedure. 1.  Lesion quadrant: Right inner lower quadrant. Using sterile technique and 1% Lidocaine as local anesthetic, under direct ultrasound visualization, a 12 gauge spring-loaded device was used to perform biopsy of the targeted suspicious mass over the 5 o'clock position of the right breast using a medial to lateral approach. At the conclusion of the procedure ribbon shaped tissue marker clip was deployed into the biopsy cavity. Follow up 2 view mammogram was performed and dictated separately. 2. Lesion quadrant: Right axilla. Using sterile technique and 1% Lidocaine as local anesthetic, under direct ultrasound visualization, a 14 gauge spring-loaded device was used to perform biopsy of the abnormal right axillary lymph node using a lateral to medial approach. At the conclusion of the procedure tri bell tissue marker clip was deployed into the biopsy cavity. Follow up 2 view mammogram was performed and dictated separately. IMPRESSION: Ultrasound guided biopsy of a suspicious right breast mass at the 5 o'clock position as well as abnormal right axillary lymph node. No apparent complications. Electronically Signed: By: Marin Olp M.D. On: 01/06/2021 08:31     ELIGIBLE FOR AVAILABLE RESEARCH PROTOCOL: no  ASSESSMENT: 56 y.o. 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 Specifically we discussed the biology of her breast cancer, its diagnosis, staging, treatment  options and prognosis.We first reviewed the fact that cancer is not one disease but more than 100 different diseases and that it is important to keep them separate-- otherwise when friends and relatives discuss their own cancer experiences with [place] confusion can result. Similarly we explained that if breast cancer spreads to the bone or liver, the patient would not have bone cancer or liver cancer, but breast cancer in the bone and breast cancer in the liver: one cancer in three places-- not 3 different cancers which otherwise would have to be treated in 3 different ways.  We discussed the difference between local and systemic therapy. In terms of loco-regional treatment, lumpectomy plus radiation is equivalent to mastectomy as far as survival is concerned. For this reason, and because the cosmetic results are generally superior, we recommend breast conserving surgery.   We also noted that in terms of sequencing of treatments, whether systemic therapy or surgery is done first does not affect the ultimate outcome.  This is relevant to Abbee's case as we believe she will benefit from neoadjuvant treatment, which allows her to receive her genetics results preop and also gives [place] prognostic information from her response  We then discussed the rationale for systemic therapy. There is some risk that this cancer may  have already spread to other parts of her body.  Since we are likely dealing with stage III disease, we will obtain a baseline CT scan and bone scan.  However scans cannot answer the question the patient really would like to know, which is whether she has microscopic disease elsewhere in her body.  Stage III disease is at very high risk of microscopic spread, in the range of 60% or so and that is the reason she needs systemic treatment.  Next we went over the options for systemic therapy which are anti-estrogens, anti-HER-2 immunotherapy, and chemotherapy. [place] does not meet criteria for anti-HER-2 immunotherapy or anti-estrogens.  She has a functionally triple negative breast cancer and will need chemotherapy.  We specifically discussed neoadjuvant chemotherapy with carboplatin and paclitaxel together with pembrolizumab followed by cyclophosphamide and doxorubicin together with pembrolizumab.  She will have an echocardiogram before starting, as well as port placement, and a baseline MRI.  She will meet with our chemotherapy teaching nurse.  Of course she will have the CT of the chest and bone scan as already scheduled.  She will return to see me on 02/02/2021 to make sure everything is in place and I am hoping we can start on 02/07/2021.  The patient has requested that I write a letter with the information regarding her cancer so that her sister [name] Abdelraheem Quorra Rebecca [name] may be allowed to come and help her.  That letter is separately scanned.  Total encounter time 65 minutes.Sarajane Jews C. Eimi Viney, MD 01/18/2021 12:05 PM Medical Oncology and Hematology [facility] [place], [place] [address] Tel. [phone]    Fax. [phone]   This document serves as a record of services personally performed by Lurline Del, MD. It was created on his behalf by Wilburn Mylar, a trained medical scribe. The creation of this record is based on the scribe's personal  observations and the provider's statements to them.   Lindie Spruce MD, have reviewed  the above documentation for accuracy and completeness, and I agree with the above.    *Total Encounter Time as defined by the Centers for Medicare and Medicaid Services includes, in addition to the face-to-face time of a patient visit (documented in the note above) non-face-to-face time: obtaining and reviewing outside history, ordering and reviewing medications, tests or procedures, care coordination (communications with other health care professionals or caregivers) and documentation in the medical record.

## 2021-01-18 ENCOUNTER — Ambulatory Visit: Payer: Self-pay | Admitting: Surgery

## 2021-01-18 ENCOUNTER — Telehealth: Payer: Self-pay | Admitting: Family Medicine

## 2021-01-18 ENCOUNTER — Other Ambulatory Visit: Payer: Self-pay

## 2021-01-18 ENCOUNTER — Encounter: Payer: Self-pay | Admitting: Oncology

## 2021-01-18 ENCOUNTER — Other Ambulatory Visit: Payer: Self-pay | Admitting: *Deleted

## 2021-01-18 ENCOUNTER — Ambulatory Visit (HOSPITAL_BASED_OUTPATIENT_CLINIC_OR_DEPARTMENT_OTHER): Payer: 59 | Admitting: Genetic Counselor

## 2021-01-18 ENCOUNTER — Ambulatory Visit: Payer: 59 | Attending: Surgery | Admitting: Physical Therapy

## 2021-01-18 ENCOUNTER — Inpatient Hospital Stay (HOSPITAL_BASED_OUTPATIENT_CLINIC_OR_DEPARTMENT_OTHER): Payer: 59 | Admitting: Oncology

## 2021-01-18 ENCOUNTER — Ambulatory Visit
Admission: RE | Admit: 2021-01-18 | Discharge: 2021-01-18 | Disposition: A | Discharge status: Home / Self Care | Payer: 59 | Source: Ambulatory Visit | Attending: Radiation Oncology | Admitting: Radiation Oncology

## 2021-01-18 ENCOUNTER — Encounter: Payer: Self-pay | Admitting: Licensed Clinical Social Worker

## 2021-01-18 ENCOUNTER — Encounter: Payer: Self-pay | Admitting: Physical Therapy

## 2021-01-18 ENCOUNTER — Encounter: Payer: Self-pay | Admitting: *Deleted

## 2021-01-18 ENCOUNTER — Encounter: Payer: Self-pay | Admitting: Genetic Counselor

## 2021-01-18 ENCOUNTER — Inpatient Hospital Stay: Payer: 59

## 2021-01-18 ENCOUNTER — Ambulatory Visit (HOSPITAL_BASED_OUTPATIENT_CLINIC_OR_DEPARTMENT_OTHER): Payer: 59

## 2021-01-18 VITALS — BP 135/79 | HR 77 | Temp 98.1°F | Resp 18 | Ht 63.0 in | Wt 139.6 lb

## 2021-01-18 DIAGNOSIS — Z171 Estrogen receptor negative status [ER-]: Secondary | ICD-10-CM

## 2021-01-18 DIAGNOSIS — Z23 Encounter for immunization: Secondary | ICD-10-CM

## 2021-01-18 DIAGNOSIS — Z170 Estrogen receptor positive status [ER+]: Secondary | ICD-10-CM | POA: Diagnosis present

## 2021-01-18 DIAGNOSIS — Z807 Family history of other malignant neoplasms of lymphoid, hematopoietic and related tissues: Secondary | ICD-10-CM

## 2021-01-18 DIAGNOSIS — C50311 Malignant neoplasm of lower-inner quadrant of right female breast: Secondary | ICD-10-CM

## 2021-01-18 DIAGNOSIS — R293 Abnormal posture: Secondary | ICD-10-CM | POA: Insufficient documentation

## 2021-01-18 DIAGNOSIS — Z800 Family history of malignant neoplasm of digestive organs: Secondary | ICD-10-CM

## 2021-01-18 DIAGNOSIS — C773 Secondary and unspecified malignant neoplasm of axilla and upper limb lymph nodes: Secondary | ICD-10-CM | POA: Diagnosis not present

## 2021-01-18 HISTORY — DX: Family history of other malignant neoplasms of lymphoid, hematopoietic and related tissues: Z80.7

## 2021-01-18 HISTORY — DX: Family history of malignant neoplasm of digestive organs: Z80.0

## 2021-01-18 LAB — CMP (CANCER CENTER ONLY)
ALT: 20 U/L (ref 0–44)
AST: 24 U/L (ref 15–41)
Albumin: 4.1 g/dL (ref 3.5–5.0)
Alkaline Phosphatase: 64 U/L (ref 38–126)
Anion gap: 13 (ref 5–15)
BUN: 9 mg/dL (ref 6–20)
CO2: 26 mmol/L (ref 22–32)
Calcium: 9.2 mg/dL (ref 8.9–10.3)
Chloride: 102 mmol/L (ref 98–111)
Creatinine: 0.67 mg/dL (ref 0.44–1.00)
GFR, Estimated: >60 mL/min (ref >60)
Glucose, Bld: 111 mg/dL — ABNORMAL HIGH (ref 70–99)
Potassium: 3.9 mmol/L (ref 3.5–5.1)
Sodium: 141 mmol/L (ref 135–145)
Total Bilirubin: 0.4 mg/dL (ref 0.3–1.2)
Total Protein: 7.7 g/dL (ref 6.5–8.1)

## 2021-01-18 LAB — CBC WITH DIFFERENTIAL (CANCER CENTER ONLY)
Abs Immature Granulocytes: 0.01 10*3/uL (ref 0.00–0.07)
Basophils Absolute: 0 10*3/uL (ref 0.0–0.1)
Basophils Relative: 1 %
Eosinophils Absolute: 2.5 10*3/uL — ABNORMAL HIGH (ref 0.0–0.5)
Eosinophils Relative: 35 %
HCT: 41.2 % (ref 36.0–46.0)
Hemoglobin: 13.3 g/dL (ref 12.0–15.0)
Immature Granulocytes: 0 %
Lymphocytes Relative: 26 %
Lymphs Abs: 1.9 10*3/uL (ref 0.7–4.0)
MCH: 26.2 pg (ref 26.0–34.0)
MCHC: 32.3 g/dL (ref 30.0–36.0)
MCV: 81.3 fL (ref 80.0–100.0)
Monocytes Absolute: 0.5 10*3/uL (ref 0.1–1.0)
Monocytes Relative: 6 %
Neutro Abs: 2.3 10*3/uL (ref 1.7–7.7)
Neutrophils Relative %: 32 %
Platelet Count: 247 10*3/uL (ref 150–400)
RBC: 5.07 MIL/uL (ref 3.87–5.11)
RDW: 13.6 % (ref 11.5–15.5)
WBC Count: 7.2 10*3/uL (ref 4.0–10.5)
nRBC: 0 % (ref 0.0–0.2)

## 2021-01-18 LAB — GENETIC SCREENING ORDER

## 2021-01-18 MED ORDER — PROCHLORPERAZINE MALEATE 10 MG PO TABS: 10.0000 mg | ORAL_TABLET | Freq: Four times a day (QID) | ORAL | 1 refills | Status: DC | PRN

## 2021-01-18 MED ORDER — LIDOCAINE-PRILOCAINE 2.5-2.5 % EX CREA: TOPICAL_CREAM | CUTANEOUS | 3 refills | Status: DC

## 2021-01-18 MED ORDER — DEXAMETHASONE 4 MG PO TABS: ORAL_TABLET | ORAL | 1 refills | Status: DC

## 2021-01-18 NOTE — H&P (View-Only) (Signed)
Rebecca [name] Appointment: 01/18/2021 9:00 AM Location: [facility] Patient #: [id number] DOB: 1966/03/12 Undefined / Language: Arabic / Race: Refused to Report/Unreported Female  History of Present Illness Rebecca Moores A. Adonys Wildes [name]; 01/18/2021 11:09 AM) Patient words: Pt seen in [place] for right breast cancer ER pos PR neg her 2 neu neg KI 90%  HX OR RIGHT AXILLARY MASS FOR 2 MONTHS speaks only arabic. Translator used today.   No hx of breast mass or breast cancer. Sore from biopsy right breast.   2.2 CM RIGHT BREAST MASS ? BILOBED LOWER OUTER QUADRANT ON MAMMOGRAM.  The patient is a 55 year old female.   Past Surgical History Rebecca Slipper, RN; 01/18/2021 8:12 AM) No pertinent past surgical history  Diagnostic Studies History Rebecca Slipper, RN; 01/18/2021 8:12 AM) Colonoscopy within last year Mammogram within last year Pap Smear 1-5 years ago  Medication History Rebecca Slipper, RN; 01/18/2021 8:12 AM) Medications Reconciled  Social History Rebecca Slipper, RN; 01/18/2021 8:12 AM) No alcohol use No caffeine use No drug use Tobacco use Never smoker.  Family History Rebecca Slipper, RN; 01/18/2021 8:12 AM) Diabetes Mellitus Father, Mother.  Pregnancy / Birth History Rebecca Slipper, RN; 01/18/2021 8:12 AM) Age at menarche 89 years. Gravida 0 Irregular periods Para 0  Other Problems Rebecca Slipper, RN; 01/18/2021 8:12 AM) High blood pressure Hypercholesterolemia Lump In Breast     Review of Systems (Rebecca [name]; 01/18/2021 11:06 AM) General Not Present- Appetite Loss, Chills, Fatigue, Fever, Night Sweats, Weight Gain and Weight Loss. Skin Not Present- Change in Wart/Mole, Dryness, Hives, Jaundice, New Lesions, Non-Healing Wounds, Rash and Ulcer. HEENT Not Present- Earache, Hearing Loss, Hoarseness, Nose Bleed, Oral Ulcers, Ringing in the Ears, Seasonal Allergies, Sinus Pain, Sore Throat, Visual Disturbances, Wears glasses/contact lenses and Yellow  Eyes. Respiratory Not Present- Bloody sputum, Chronic Cough, Difficulty Breathing, Snoring and Wheezing. Cardiovascular Not Present- Chest Pain, Difficulty Breathing Lying Down, Leg Cramps, Palpitations, Rapid Heart Rate, Shortness of Breath and Swelling of Extremities. Gastrointestinal Not Present- Abdominal Pain, Bloating, Bloody Stool, Change in Bowel Habits, Chronic diarrhea, Constipation, Difficulty Swallowing, Excessive gas, Gets full quickly at meals, Hemorrhoids, Indigestion, Nausea, Rectal Pain and Vomiting. Female Genitourinary Not Present- Frequency, Nocturia, Painful Urination, Pelvic Pain and Urgency. Musculoskeletal Not Present- Back Pain, Joint Pain, Joint Stiffness, Muscle Pain, Muscle Weakness and Swelling of Extremities. Neurological Not Present- Decreased Memory, Fainting, Headaches, Numbness, Seizures, Tingling, Tremor, Trouble walking and Weakness. Psychiatric Not Present- Anxiety, Bipolar, Change in Sleep Pattern, Depression, Fearful and Frequent crying. Endocrine Present- New Diabetes. Not Present- Cold Intolerance, Excessive Hunger, Hair Changes, Heat Intolerance and Hot flashes. Hematology Not Present- Blood Thinners, Easy Bruising, Excessive bleeding, Gland problems, HIV and Persistent Infections. All other systems negative   Physical Exam (Rebecca Madrid A. Pattricia Weiher [name]; 01/18/2021 11:07 AM)  General Mental Status-Alert. General Appearance-Consistent with stated age. Hydration-Well hydrated. Voice-Normal.  Head and Neck Head-normocephalic, atraumatic with no lesions or palpable masses.  Breast Note: RIGHT AXILLARY ADENOPATHY NOTED NO MASS PALPABLE RIGH TBREAST LEFT BREAST NORMAL  Cardiovascular Cardiovascular examination reveals -on palpation PMI is normal in location and amplitude, no palpable S3 or S4. Normal cardiac borders., normal heart sounds, regular rate and rhythm with no murmurs, carotid auscultation reveals no bruits and normal pedal pulses  bilaterally.  Neurologic Neurologic evaluation reveals -alert and oriented x 3 with no impairment of recent or remote memory. Mental Status-Normal.  Musculoskeletal Normal Exam - Left-Upper Extremity Strength Normal and Lower Extremity Strength Normal. Normal Exam - Right-Upper Extremity Strength  Normal, Lower Extremity Weakness.    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 Risk include bleeding, infection, pneumothorax, hemothorax, mediastinal injury, nerve injury , blood vessel injury, stroke, blood clots, death, migration. embolization and need for additional procedures. Pt agrees to proceed.  Current Plans Use of a central venous catheter for intravenous therapy was discussed. Technique of catheter placement using ultrasound and fluoroscopy guidance was discussed. Risks such as bleeding, infection, pneumothorax, catheter occlusion, reoperation, and other risks were discussed. I noted a good likelihood this will help address the problem. Questions were answered. The patient expressed understanding & wishes to proceed. Pt Education - [place]

## 2021-01-18 NOTE — Patient Instructions (Signed)

## 2021-01-18 NOTE — Telephone Encounter (Deleted)
   Kristopher A Newson DOB: 27-Jul-1966 MRN: [id number]   RIDER WAIVER AND RELEASE OF LIABILITY  For purposes of improving physical access to our facilities, [facility] is pleased to partner with third parties to provide [facility] patients or other authorized individuals the option of convenient, on-demand ground transportation services (the [place]") through use of the technology service that enables users to request on-demand ground transportation from independent third-party providers.  By opting to use and accept these [employer], I, the undersigned, hereby agree on behalf of myself, and on behalf of any minor child using the [employer] for whom I am the parent or legal guardian, as follows:  1. Government social research officer provided to me are provided by independent third-party transportation providers who are not [employer] or employees and who are unaffiliated with [employer]. 2. [facility] is neither a transportation carrier nor a common or public carrier. 3. [facility] has no control over the quality or safety of the transportation that occurs as a result of the [employer]. 4. [facility] cannot guarantee that any third-party transportation provider will complete any arranged transportation service. 5. [facility] makes no representation, warranty, or guarantee regarding the reliability, timeliness, quality, safety, suitability, or availability of any of the Transport Services or that they will be error free. 6. I fully understand that traveling by vehicle involves risks and dangers of serious bodily injury, including permanent disability, paralysis, and death. I agree, on behalf of myself and on behalf of any minor child using the Transport Services for whom I am the parent or legal guardian, that the entire risk arising out of my use of the [employer] remains solely with me, to the maximum extent permitted under applicable law. 7. The [employer] are provided "as is" and "as available." [facility] disclaims all representations and warranties, express, implied or statutory, not expressly set out in these terms, including the implied warranties of merchantability and fitness for a particular purpose. 8. I hereby waive and release [facility], its agents, employees, officers, directors, representatives, insurers, attorneys, assigns, successors, subsidiaries, and affiliates from any and all past, present, or future claims, demands, liabilities, actions, causes of action, or suits of any kind directly or indirectly arising from acceptance and use of the [employer]. 9. I further waive and release [facility] and its affiliates from all present and future liability and responsibility for any injury or death to persons or damages to property caused by or related to the use of the [employer]. 10. I have read this Waiver and Release of Liability, and I understand the terms used in it and their legal significance. This Waiver is freely and voluntarily given with the understanding that my right (as well as the right of any minor child for whom I am the parent or legal guardian using the [employer]) to legal recourse against [facility] in connection with the [employer] is knowingly surrendered in return for use of these services.   I attest that I read the consent document to Teana A Bosket, gave Ms. Mcquigg the opportunity to ask questions and answered the questions asked (if any). I affirm that [place] then provided consent for she's participation in this program.     [name]

## 2021-01-18 NOTE — H&P (Signed)
Rebecca [name] Appointment: 01/18/2021 9:00 AM Location: [facility] Patient #: [id number] DOB: 07/30/1966 Undefined / Language: Arabic / Race: Refused to Report/Unreported Female  History of Present Illness Rebecca Moores A. Airon Sahni MD; 01/18/2021 11:09 AM) Patient words: Pt seen in [place] for right breast cancer ER pos PR neg her 2 neu neg KI 90%  HX OR RIGHT AXILLARY MASS FOR 2 MONTHS speaks only arabic. Translator used today.   No hx of breast mass or breast cancer. Sore from biopsy right breast.   2.2 CM RIGHT BREAST MASS ? BILOBED LOWER OUTER QUADRANT ON MAMMOGRAM.  The patient is a 55 year old female.   Past Surgical History Rebecca Slipper, RN; 01/18/2021 8:12 AM) No pertinent past surgical history  Diagnostic Studies History Rebecca Slipper, RN; 01/18/2021 8:12 AM) Colonoscopy within last year Mammogram within last year Pap Smear 1-5 years ago  Medication History Rebecca Slipper, RN; 01/18/2021 8:12 AM) Medications Reconciled  Social History Rebecca Slipper, RN; 01/18/2021 8:12 AM) No alcohol use No caffeine use No drug use Tobacco use Never smoker.  Family History Rebecca Slipper, RN; 01/18/2021 8:12 AM) Diabetes Mellitus Father, Mother.  Pregnancy / Birth History Rebecca Slipper, RN; 01/18/2021 8:12 AM) Age at menarche 29 years. Gravida 0 Irregular periods Para 0  Other Problems Rebecca Slipper, RN; 01/18/2021 8:12 AM) High blood pressure Hypercholesterolemia Lump In Breast     Review of Systems (Rebecca Ashmore A. Makara Lanzo MD; 01/18/2021 11:06 AM) General Not Present- Appetite Loss, Chills, Fatigue, Fever, Night Sweats, Weight Gain and Weight Loss. Skin Not Present- Change in Wart/Mole, Dryness, Hives, Jaundice, New Lesions, Non-Healing Wounds, Rash and Ulcer. HEENT Not Present- Earache, Hearing Loss, Hoarseness, Nose Bleed, Oral Ulcers, Ringing in the Ears, Seasonal Allergies, Sinus Pain, Sore Throat, Visual Disturbances, Wears glasses/contact lenses and Yellow  Eyes. Respiratory Not Present- Bloody sputum, Chronic Cough, Difficulty Breathing, Snoring and Wheezing. Cardiovascular Not Present- Chest Pain, Difficulty Breathing Lying Down, Leg Cramps, Palpitations, Rapid Heart Rate, Shortness of Breath and Swelling of Extremities. Gastrointestinal Not Present- Abdominal Pain, Bloating, Bloody Stool, Change in Bowel Habits, Chronic diarrhea, Constipation, Difficulty Swallowing, Excessive gas, Gets full quickly at meals, Hemorrhoids, Indigestion, Nausea, Rectal Pain and Vomiting. Female Genitourinary Not Present- Frequency, Nocturia, Painful Urination, Pelvic Pain and Urgency. Musculoskeletal Not Present- Back Pain, Joint Pain, Joint Stiffness, Muscle Pain, Muscle Weakness and Swelling of Extremities. Neurological Not Present- Decreased Memory, Fainting, Headaches, Numbness, Seizures, Tingling, Tremor, Trouble walking and Weakness. Psychiatric Not Present- Anxiety, Bipolar, Change in Sleep Pattern, Depression, Fearful and Frequent crying. Endocrine Present- New Diabetes. Not Present- Cold Intolerance, Excessive Hunger, Hair Changes, Heat Intolerance and Hot flashes. Hematology Not Present- Blood Thinners, Easy Bruising, Excessive bleeding, Gland problems, HIV and Persistent Infections. All other systems negative   Physical Exam (Rebecca Grussing A. Keela Rubert MD; 01/18/2021 11:07 AM)  General Mental Status-Alert. General Appearance-Consistent with stated age. Hydration-Well hydrated. Voice-Normal.  Head and Neck Head-normocephalic, atraumatic with no lesions or palpable masses.  Breast Note: RIGHT AXILLARY ADENOPATHY NOTED NO MASS PALPABLE RIGH TBREAST LEFT BREAST NORMAL  Cardiovascular Cardiovascular examination reveals -on palpation PMI is normal in location and amplitude, no palpable S3 or S4. Normal cardiac borders., normal heart sounds, regular rate and rhythm with no murmurs, carotid auscultation reveals no bruits and normal pedal pulses  bilaterally.  Neurologic Neurologic evaluation reveals -alert and oriented x 3 with no impairment of recent or remote memory. Mental Status-Normal.  Musculoskeletal Normal Exam - Left-Upper Extremity Strength Normal and Lower Extremity Strength Normal. Normal Exam - Right-Upper Extremity Strength  Normal, Lower Extremity Weakness.    Assessment & Plan (Rebecca Moll A. 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 Risk include bleeding, infection, pneumothorax, hemothorax, mediastinal injury, nerve injury , blood vessel injury, stroke, blood clots, death, migration. embolization and need for additional procedures. Pt agrees to proceed.  Current Plans Use of a central venous catheter for intravenous therapy was discussed. Technique of catheter placement using ultrasound and fluoroscopy guidance was discussed. Risks such as bleeding, infection, pneumothorax, catheter occlusion, reoperation, and other risks were discussed. I noted a good likelihood this will help address the problem. Questions were answered. The patient expressed understanding & wishes to proceed. Pt Education - [place]

## 2021-01-18 NOTE — Progress Notes (Signed)
   Covid-19 Vaccination Clinic  Name:  KALANDRA MASTERS    MRN: [id number] DOB: [birth year]  01/18/2021  Ms. Robbins was observed post Covid-19 immunization for 15 minutes without incident. She was provided with Vaccine Information Sheet and instruction to access the V-Safe system.   Ms. Dudgeon was instructed to call 911 with any severe reactions post vaccine: Marland Kitchen Difficulty breathing  . Swelling of face and throat  . A fast heartbeat  . A bad rash all over body  . Dizziness and weakness

## 2021-01-18 NOTE — Progress Notes (Signed)
[facility] Work  Initial Assessment   Rebecca [name] is a 55 y.o. year old female accompanied by patient and friend. Clinical Social Work was referred by [facility] for assessment of psychosocial needs.   SDOH (Social Determinants of Health) assessments performed: Yes SDOH Interventions   Flowsheet Row Most Recent Value  SDOH Interventions   Transportation Interventions [facility]      Distress Screen completed: Yes ONCBCN DISTRESS SCREENING 01/18/2021  Screening Type Initial Screening  Distress experienced in past week (1-10) 10  Family Problem type Partner  Emotional problem type Depression;Nervousness/Anxiety;Adjusting to illness;Isolation/feeling alone;Adjusting to appearance changes  Spiritual/Religous concerns type Loss of sense of purpose  Physical Problem type Pain;Skin dry/itchy      Family/Social Information:  . Housing Arrangement: patient lives with nephew (71 yo) . Family members/support persons in your life? Family- trying to have sister come to help, friend, religious community . Transportation concerns: potentially during treatment  . 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 Income source: Employment . Financial concerns: Yes, due to illness and/or loss of work during treatment o Type of concern: General financial concerns if needing to take time off from work . Food access concerns: no . Religious or spiritual practice: yes . Services Currently in place:  N/a- working on getting in [employer] paperwork  Coping/ Adjustment to diagnosis: . Patient understands treatment plan and what happens next? yes . Concerns about diagnosis and/or treatment: Overwhelmed by information, Afraid of cancer and How I will pay for the services I need . Patient reported stressors: Finances, Depression, Anxiety, Adjusting to my illness, Isolation/ feeling alone and Loss of sense of purpose . Current coping skills/ strengths:  Capable of independent living and Religious Affiliation    SUMMARY: Current SDOH Barriers:  . Financial constraints related to work changes from treatment  Clinical Social Work Clinical Goal(s):  Marland Kitchen Patient will complete applications for assistance and notify CSW with any questions and/or when ready to submit  Interventions: . Discussed common feeling and emotions when being diagnosed with cancer, and the importance of support during treatment . Informed patient of the support team roles and support services at [facility] . Provided CSW contact information and encouraged patient to call with any questions or concerns . Referred patient to [facility] transportation services and Provided patient with information about breast cancer foundations for assistance   Follow Up Plan: Patient will work on applications for assistance and contact CSW with any questions Patient verbalizes understanding of plan: Yes    Christeen Douglas , LCSW

## 2021-01-18 NOTE — Progress Notes (Signed)
REFERRING PROVIDER: Chauncey Cruel, MD [address] [place],  [place] [address]  PRIMARY PROVIDER:  [name], MD  PRIMARY REASON FOR VISIT:  Encounter Diagnoses  Name Primary?  . Family history of throat cancer   . Family history of lymphoma   . Malignant neoplasm of lower-inner quadrant of right breast of female, estrogen receptor positive ([place]) Yes     HISTORY OF PRESENT ILLNESS:   Ms. Danser, a 55 y.o. female, was seen for a [place] cancer genetics consultation during the breast multidisciplinary clinic at the request of Dr. Jana Hakim due to a personal history of cancer.  Ms. Qualls presents to clinic with her friend, Conception Oms, today to discuss the possibility of a hereditary predisposition to cancer, to discuss genetic testing, and to further clarify her future cancer risks, as well as potential cancer risks for family members.   In March 2022, at the age of 89, Ms. 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 The treatment plan includes neoadjuvant chemotherapy.   CANCER HISTORY:  Oncology History   No history exists.     RISK FACTORS:  Menarche was at age 20.  Nulliparous OCP use for approximately 1 years.  Ovaries intact: yes.  Hysterectomy: no.  Menopausal status: most recent period Oct 31, 2020 HRT use: 0 years. Colonoscopy: no; not examined. Mammogram within the last year: yes. Up to date with pelvic exams: yes; most recent March 2022  Past Medical History:  Diagnosis Date  . Arthritis   . Diabetes mellitus   . Hyperlipidemia     Past Surgical History:  Procedure Laterality Date  . CESAREAN SECTION      Social History   Socioeconomic History  . Marital status: Married    Spouse name: Not on file  . Number of children: Not on file  . Years of education: Not on file  . Highest education level: Not on file  Occupational History  . Not on file  Tobacco  Use  . Smoking status: Passive Smoke Exposure - Never Smoker  . Smokeless tobacco: Never Used  Substance and Sexual Activity  . Alcohol use: No  . Drug use: No  . Sexual activity: Yes  Other Topics Concern  . Not on file  Social History Narrative  . Not on file   Social Determinants of Health   Financial Resource Strain: Not on file  Food Insecurity: Not on file  Transportation Needs: Not on file  Physical Activity: Not on file  Stress: Not on file  Social Connections: Not on file     FAMILY HISTORY:  We obtained a detailed, 4-generation family history.  Significant diagnoses are listed below: Family History  Problem Relation Age of Onset  . Diabetes Mother   . Heart disease Mother   . Diabetes Father   . Throat cancer Father        d. 60s  . Diabetes Sister   . Diabetes Brother   . Lymphoma Cousin        maternal cousin; d. 46s      Ms. Otten has one sister and six brothers, all without a cancer history.  Her mother died in her 64s and did not have cancer.  She had a maternal cousin with lymphoma who passed away in his 29s.  Ms. Heathman's father died in his 56s and had throat cancer.  No other paternal family history of cancer was reported.   Ms. Vanzee is unaware of  previous family history of genetic testing for hereditary cancer risks. Patient's maternal ancestors are of [place] descent, and paternal ancestors are of [place] descent. There is no reported Ashkenazi Jewish ancestry. Ms. Enyeart parents are first cousins.   GENETIC COUNSELING ASSESSMENT: Ms. Thoreson is a 55 y.o. female with a personal history of cancer which is somewhat suggestive of a hereditary cancer syndrome and predisposition to cancer given the triple negative nature of her cancer. We, therefore, discussed and recommended the following at today's visit.   DISCUSSION: We discussed that 5 - 10% of cancer is hereditary, with most cases of hereditary breast cancer  associated with mutations in BRCA1/2.  There are other genes that can be associated with hereditary breast cancer syndromes.  Type of cancer risk and level of risk are gene-specific.  We discussed that testing is beneficial for several reasons including knowing how to follow individuals after completing their treatment, identifying whether potential treatment options would be beneficial, and understanding if other family members could be at risk for cancer and allowing them to undergo genetic testing.   We reviewed the characteristics, features and inheritance patterns of hereditary cancer syndromes. We also discussed genetic testing, including the appropriate family members to test, the process of testing, insurance coverage and turn-around-time for results. We discussed the implications of a negative, positive, carrier and/or variant of uncertain significant result. We recommended Ms. Archambeault pursue genetic testing for a panel that includes genes associated with breast cancer as well as other types of cancer.   The CancerNext-Expanded gene panel offered by [facility] and includes sequencing, rearrangement, and RNA analysis for the following 77 genes: AIP, ALK, APC, ATM, AXIN2, BAP1, BARD1, BLM, BMPR1A, BRCA1, BRCA2, BRIP1, CDC73, CDH1, CDK4, CDKN1B, CDKN2A, CHEK2, CTNNA1, DICER1, FANCC, FH, FLCN, GALNT12, KIF1B, LZTR1, MAX, MEN1, MET, MLH1, MSH2, MSH3, MSH6, MUTYH, NBN, NF1, NF2, NTHL1, PALB2, PHOX2B, PMS2, POT1, PRKAR1A, PTCH1, PTEN, RAD51C, RAD51D, RB1, RECQL, RET, SDHA, SDHAF2, SDHB, SDHC, SDHD, SMAD4, SMARCA4, SMARCB1, SMARCE1, STK11, SUFU, TMEM127, TP53, TSC1, TSC2, VHL and XRCC2 (sequencing and deletion/duplication); EGFR, EGLN1, HOXB13, KIT, MITF, PDGFRA, POLD1, and POLE (sequencing only); EPCAM and GREM1 (deletion/duplication only).   Based on Ms. Rzasa's personal history of functionally triple negative cancer, she meets medical criteria for genetic testing. Despite that she meets  criteria, she may still have an out of pocket cost. We discussed that if her out of pocket cost for testing is over $100, the laboratory will reach out to her to discuss patient pay assistance programs or self-pay options.  Ms. Kue provided income information to see if she qualifies for patient pay assistance programs.     PLAN: After considering the risks, benefits, and limitations, Ms. Vonbehren provided informed consent to pursue genetic testing and the blood sample was sent to [facility] for analysis of the CancerNext-Expanded +RNAinsight Panel. Results should be available within approximately 3 weeks' time, at which point they will be disclosed by telephone to Ms. Pung, as will any additional recommendations warranted by these results. Ms. Groom will receive a summary of her genetic counseling visit and a copy of her results once available. This information will also be available in Epic.  Lastly, we encouraged Ms. Werden to remain in contact with cancer genetics annually so that we can continuously update the family history and inform her of any changes in cancer genetics and testing that may be of benefit for this family.   Ms. Cancel questions were answered to her satisfaction today. Our contact information was  provided should additional questions or concerns arise. Thank you for the referral and allowing [place] to share in the care of your patient.   Harvey Matlack M. Joette Catching, [place], [facility] Genetic Counselor [email or web address] (P) [phone]  The patient was seen for a total of 20 minutes in face-to-face genetic counseling.  Drs. Magrinat, Lindi Adie and/or Burr Medico were available to discuss this case as needed.    _______________________________________________________________________ For Office Staff:  Number of people involved in session: 1 Was an Intern/ student involved with case: no

## 2021-01-18 NOTE — Progress Notes (Signed)
START ON PATHWAY REGIMEN - Breast     Cycles 1 through 4: A cycle is every 21 days:     Pembrolizumab      Paclitaxel      Carboplatin      Filgrastim-xxxx    Cycles 5 through 8: A cycle is every 21 days:     Pembrolizumab      Doxorubicin      Cyclophosphamide      Pegfilgrastim-xxxx   **Always confirm dose/schedule in your pharmacy ordering system**  Patient Characteristics: Preoperative or Nonsurgical Candidate (Clinical Staging), Neoadjuvant Therapy followed by Surgery, Invasive Disease, Chemotherapy, HER2 Negative/Unknown/Equivocal, ER Negative/Unknown, Platinum Therapy Indicated, High-Risk Disease Present Therapeutic Status: Preoperative or Nonsurgical Candidate (Clinical Staging) AJCC M Category: cM0 AJCC Grade: G3 Breast Surgical Plan: Neoadjuvant Therapy followed by Surgery ER Status: Negative (-) AJCC 8 Stage Grouping: IIIB HER2 Status: Negative (-) AJCC T Category: cT2 AJCC N Category: cN1 PR Status: Negative (-) Type of Therapy: Platinum Therapy Indicated Intent of Therapy: Curative Intent, Discussed with Patient 

## 2021-01-18 NOTE — Progress Notes (Signed)
Radiation Oncology         [phone] ________________________________  Initial Outpatient Consultation  Name: Rebecca [name] MRN: [id number]  Date: 01/18/2021  DOB: Aug 08, 1966  [id number], [name], MD  Erroll Luna, MD   REFERRING PHYSICIAN: Erroll Luna, MD  DIAGNOSIS:    ICD-10-CM   1. Malignant neoplasm of lower-inner quadrant of right breast of female, estrogen receptor positive ([place])  C50.311    Z17.0     Cancer Staging Malignant neoplasm of lower-inner quadrant of right breast of female, estrogen receptor positive ([place]) Staging form: Breast, AJCC 8th Edition - Clinical: Stage IIIB (cT2, cN1, cM0, G3, ER-, PR-, HER2-) - Signed by Chauncey Cruel, MD on 01/18/2021 Stage prefix: Initial diagnosis Histologic grading system: 3 grade system   CHIEF COMPLAINT: Here to discuss management of right breast cancer  HISTORY OF PRESENT ILLNESS::Rebecca [name] is a 55 y.o. female who presented with right breast abnormality on the following imaging: bilateral diagnostic mammogram on the date of 01/03/2021. Symptoms, if any, at that time, were: two-month history of palpable right axillary mass. Ultrasound of the right breast on 01/03/2021 revealed two adjacent suspicious right breast masses at the 5 o'clock position 6 cm from the nipple. There was also noted to be a markedly abnormal right axillary lymph node. There was no mammographic evidence of malignancy on the left. 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 ER status: 10% weak; PR status: 0% negative; Her2 status: negative; Grade: 3.  I have personally reviewed her imaging. She is here with a Optometrist and a friend.  She denies prior radiotherapy.  Her second covid shot was in Oct, she has not received a booster shot yet.  PREVIOUS RADIATION THERAPY: No  PAST MEDICAL HISTORY:  has a past medical history of Arthritis,  Breast cancer ([place]), Diabetes mellitus, Family history of lymphoma (01/18/2021), Family history of throat cancer (01/18/2021), Hyperlipidemia, and Hypertension.    PAST SURGICAL HISTORY: Past Surgical History:  Procedure Laterality Date  . CESAREAN SECTION      FAMILY HISTORY: family history includes Diabetes in her brother, father, mother, and sister; Heart disease in her mother; Lymphoma in her cousin; Throat cancer in her father.  SOCIAL HISTORY:  reports that she is a non-smoker but has been exposed to tobacco smoke. She has never used smokeless tobacco. She reports that she does not drink alcohol and does not use drugs.  ALLERGIES: Lisinopril  MEDICATIONS:  Current Outpatient Medications  Medication Sig Dispense Refill  . amLODipine (NORVASC) 10 MG tablet Take 1 tablet (10 mg total) by mouth daily. 90 tablet 1  . atorvastatin (LIPITOR) 80 MG tablet TAKE 1 TABLET (80 MG TOTAL) BY MOUTH DAILY. 90 tablet 1  . Blood Glucose Monitoring Suppl (ONETOUCH VERIO REFLECT) w/Device KIT 1 kit by Does not apply route in the morning, at noon, and at bedtime. Use as directed to test blood sugar up to three times daily.E11.9 1 kit 0  . cetirizine (ZYRTEC) 10 MG tablet Take 1 tablet (10 mg total) by mouth daily. 30 tablet 0  . cloNIDine (CATAPRES) 0.1 MG tablet Take 1 tablet (0.1 mg total) by mouth at bedtime as needed. 90 tablet 1  . clotrimazole (CLOTRIMAZOLE AF) 1 % cream Apply 1 application topically 2 (two) times daily. 30 g 1  . dapagliflozin propanediol (FARXIGA) 10 MG TABS tablet Take 1 tablet (10 mg total) by mouth daily before breakfast. 90  tablet 1  . dexamethasone (DECADRON) 4 MG tablet Take 2 tablets once a day for 3 days after carboplatin and AC chemotherapy. Take with food. 30 tablet 1  . ergocalciferol (DRISDOL) 1.25 MG (50000 UT) capsule Take 1 capsule (50,000 Units total) by mouth once a week. 12 capsule 0  . glipiZIDE (GLUCOTROL) 5 MG tablet TAKE 1 TABLET BY MOUTH DAILY 90 tablet 1  .  glucose blood (ONETOUCH VERIO) test strip Use as directed to test blood sugar up to three times daily. E11.9 100 each 6  . Lancets (ONETOUCH DELICA PLUS [id number]) MISC 1 Device by Does not apply route in the morning, at noon, and at bedtime. Use as directed to test blood sugar up to three times daily. E11.9 100 each 6  . lidocaine-prilocaine (EMLA) cream Apply to affected area once 30 g 3  . metFORMIN (GLUCOPHAGE) 500 MG tablet TAKE 1 TABLET BY MOUTH TWICE DAILY 180 tablet 0  . naproxen (NAPROSYN) 500 MG tablet Take 1 tablet (500 mg total) by mouth 2 (two) times daily with a meal. 30 tablet 1  . omeprazole (PRILOSEC) 20 MG capsule Take 1 capsule (20 mg total) by mouth daily. 30 capsule 0  . polyethylene glycol (MIRALAX MIX-IN PAX) 17 g packet Take 17 g by mouth daily. 14 each 0  . prochlorperazine (COMPAZINE) 10 MG tablet Take 1 tablet (10 mg total) by mouth every 6 (six) hours as needed (Nausea or vomiting). 30 tablet 1   No current facility-administered medications for this encounter.    REVIEW OF SYSTEMS: As above  PHYSICAL EXAM:  vitals were not taken for this visit.   General: Alert and oriented, in no acute distress Heart: Regular in rate and rhythm with no murmurs, rubs, or gallops. Chest: Clear to auscultation bilaterally, with no rhonchi, wheezes, or rales. Abdomen: Soft, nontender, nondistended, with no rigidity or guarding. Extremities: No cyanosis or edema. Psychiatric: Judgment and insight are intact. Affect is appropriate. Breasts: Right breast is heterogeneously dense, with density concentrated most in LIQ;  Right axilla + for 2.5 cm mass. No other palpable masses appreciated in the breasts or axillae bilaterally .   ECOG = 0  0 - Asymptomatic (Fully active, able to carry on all predisease activities without restriction)  1 - Symptomatic but completely ambulatory (Restricted in physically strenuous activity but ambulatory and able to carry out work of a light or sedentary  nature. For example, light housework, office work)  2 - Symptomatic, <50% in bed during the day (Ambulatory and capable of all self care but unable to carry out any work activities. Up and about more than 50% of waking hours)  3 - Symptomatic, >50% in bed, but not bedbound (Capable of only limited self-care, confined to bed or chair 50% or more of waking hours)  4 - Bedbound (Completely disabled. Cannot carry on any self-care. Totally confined to bed or chair)  5 - Death   Eustace Pen MM, Creech RH, Tormey DC, et al. [phone]). "Toxicity and response criteria of the [facility]". [place] Oncol. 5 (6): 649-55   LABORATORY DATA:  Lab Results  Component Value Date   WBC 7.2 01/18/2021   HGB 13.3 01/18/2021   HCT 41.2 01/18/2021   MCV 81.3 01/18/2021   PLT 247 01/18/2021   CMP     Component Value Date/Time   NA 141 01/18/2021 0810   NA 140 12/27/2020 1123   K 3.9 01/18/2021 0810   CL 102 01/18/2021 0810  CO2 26 01/18/2021 0810   GLUCOSE 111 (H) 01/18/2021 0810   BUN 9 01/18/2021 0810   BUN 11 12/27/2020 1123   CREATININE 0.67 01/18/2021 0810   CREATININE 0.50 11/02/2016 1102   CALCIUM 9.2 01/18/2021 0810   PROT 7.7 01/18/2021 0810   PROT 7.5 12/27/2020 1123   ALBUMIN 4.1 01/18/2021 0810   ALBUMIN 4.7 12/27/2020 1123   AST 24 01/18/2021 0810   ALT 20 01/18/2021 0810   ALKPHOS 64 01/18/2021 0810   BILITOT 0.4 01/18/2021 0810   GFRNONAA >60 01/18/2021 0810   GFRNONAA >89 01/16/2016 1006   GFRAA 118 06/02/2020 0942   GFRAA >89 01/16/2016 1006         RADIOGRAPHY: US BREAST LTD UNI RIGHT INC AXILLA  Result Date: 01/03/2021 CLINICAL DATA:  55 year old female with a palpable mass in the right axilla for 2 months. EXAM: DIGITAL DIAGNOSTIC BILATERAL MAMMOGRAM WITH TOMOSYNTHESIS AND CAD; ULTRASOUND RIGHT BREAST LIMITED TECHNIQUE: Bilateral digital diagnostic mammography and breast tomosynthesis was performed. The images were evaluated with computer-aided  detection.; Targeted ultrasound examination of the right breast was performed COMPARISON:  Previous exam(s). ACR Breast Density Category b: There are scattered areas of fibroglandular density. FINDINGS: There is been interval development of an irregular hyperdense mass in the inferior right breast at posterior depth. No additional suspicious findings in the remainder of either breast. A radiopaque BB was placed in the right axilla with no definite finding seen mammographically. Targeted ultrasound is performed, showing 2 adjacent hypoechoic masses with associated vascularity at the 5 o'clock position 6 cm from the nipple. They measure 11 x 10 x 8 mm and 10 x 9 x 6 mm. In total this area spans approximately 2.2 cm. This correlates with the mammographic finding. Evaluation of the right axilla demonstrates a markedly abnormal lymph node with diffuse cortical thickness up to 2 cm and complete hilar effacement. IMPRESSION: 1. Two adjacent suspicious right breast masses at the 5 o'clock position 6 cm from the nipple. Recommendation is for ultrasound-guided biopsy. Both masses can likely be biopsied at the same time. 2. Markedly abnormal right axillary lymph node. Recommendation is for ultrasound-guided biopsy. 3. No mammographic evidence of malignancy on the left. RECOMMENDATION: Two area ultrasound-guided biopsy of the right breast and axilla. I have discussed the findings and recommendations with the patient. If applicable, a reminder letter will be sent to the patient regarding the next appointment. BI-RADS CATEGORY  5: Highly suggestive of malignancy. Electronically Signed   By: Kristopher Oppenheim M.D.   On: 01/03/2021 16:04   MM DIAG BREAST TOMO BILATERAL  Result Date: 01/03/2021 CLINICAL DATA:  55 year old female with a palpable mass in the right axilla for 2 months. EXAM: DIGITAL DIAGNOSTIC BILATERAL MAMMOGRAM WITH TOMOSYNTHESIS AND CAD; ULTRASOUND RIGHT BREAST LIMITED TECHNIQUE: Bilateral digital diagnostic  mammography and breast tomosynthesis was performed. The images were evaluated with computer-aided detection.; Targeted ultrasound examination of the right breast was performed COMPARISON:  Previous exam(s). ACR Breast Density Category b: There are scattered areas of fibroglandular density. FINDINGS: There is been interval development of an irregular hyperdense mass in the inferior right breast at posterior depth. No additional suspicious findings in the remainder of either breast. A radiopaque BB was placed in the right axilla with no definite finding seen mammographically. Targeted ultrasound is performed, showing 2 adjacent hypoechoic masses with associated vascularity at the 5 o'clock position 6 cm from the nipple. They measure 11 x 10 x 8 mm and 10 x 9 x 6 mm. In  total this area spans approximately 2.2 cm. This correlates with the mammographic finding. Evaluation of the right axilla demonstrates a markedly abnormal lymph node with diffuse cortical thickness up to 2 cm and complete hilar effacement. IMPRESSION: 1. Two adjacent suspicious right breast masses at the 5 o'clock position 6 cm from the nipple. Recommendation is for ultrasound-guided biopsy. Both masses can likely be biopsied at the same time. 2. Markedly abnormal right axillary lymph node. Recommendation is for ultrasound-guided biopsy. 3. No mammographic evidence of malignancy on the left. RECOMMENDATION: Two area ultrasound-guided biopsy of the right breast and axilla. I have discussed the findings and recommendations with the patient. If applicable, a reminder letter will be sent to the patient regarding the next appointment. BI-RADS CATEGORY  5: Highly suggestive of malignancy. Electronically Signed   By: Kristopher Oppenheim M.D.   On: 01/03/2021 16:04   [place] AXILLARY NODE CORE BIOPSY RIGHT  Addendum Date: 01/09/2021   ADDENDUM REPORT: 01/09/2021 14:15 ADDENDUM: Pathology revealed GRADE III INVASIVE DUCTAL CARCINOMA of the Right breast, 5 o'clock,  6cmfn. This was found to be concordant by Dr. Marin Olp. Pathology revealed METASTATIC CARCINOMA INVOLVING A LYMPH NODE of the Right axilla. This was found to be concordant by Dr. Marin Olp. Pathology results were discussed with the patient by telephone. The patient reported doing well after the biopsies with tenderness at the sites. Post biopsy instructions and care were reviewed and questions were answered. The patient was encouraged to call [facility] for any additional concerns. My direct phone number was provided. The patient was referred to [facility] on January 18, 2021. Pathology results reported by Terie Purser, RN on 01/09/2021. Electronically Signed   By: Marin Olp M.D.   On: 01/09/2021 14:15   Result Date: 01/09/2021 CLINICAL DATA:  Patient presents for ultrasound-guided core needle biopsy 2 adjacent suspicious masses at the 5 o'clock position of the right breast 6 cm from the nipple measuring 2.2 cm together as well as biopsy of an abnormal right axillary lymph node. EXAM: ULTRASOUND GUIDED RIGHT BREAST CORE NEEDLE BIOPSY COMPARISON:  Previous exam(s). PROCEDURE: I met with the patient and we discussed the procedure of ultrasound-guided biopsy, including benefits and alternatives. We discussed the high likelihood of a successful procedure. We discussed the risks of the procedure, including infection, bleeding, tissue injury, clip migration, and inadequate sampling. Informed written consent was given. The usual time-out protocol was performed immediately prior to the procedure. 1.  Lesion quadrant: Right inner lower quadrant. Using sterile technique and 1% Lidocaine as local anesthetic, under direct ultrasound visualization, a 12 gauge spring-loaded device was used to perform biopsy of the targeted suspicious mass over the 5 o'clock position of the right breast using a medial to lateral  approach. At the conclusion of the procedure ribbon shaped tissue marker clip was deployed into the biopsy cavity. Follow up 2 view mammogram was performed and dictated separately. 2. Lesion quadrant: Right axilla. Using sterile technique and 1% Lidocaine as local anesthetic, under direct ultrasound visualization, a 14 gauge spring-loaded device was used to perform biopsy of the abnormal right axillary lymph node using a lateral to medial approach. At the conclusion of the procedure tri bell tissue marker clip was deployed into the biopsy cavity. Follow up 2 view mammogram was performed and dictated separately. IMPRESSION: Ultrasound guided biopsy of a suspicious right breast mass at the 5 o'clock position as well as abnormal right  axillary lymph node. No apparent complications. Electronically Signed: By: Marin Olp M.D. On: 01/06/2021 08:31   MM CLIP PLACEMENT RIGHT  Result Date: 01/06/2021 CLINICAL DATA:  Patient is post ultrasound-guided core needle biopsy of a suspicious mass over the 3 o'clock position of the right breast as well as abnormal deep right axillary lymph node EXAM: DIAGNOSTIC RIGHT MAMMOGRAM POST ULTRASOUND BIOPSY COMPARISON:  Previous exam(s). FINDINGS: Mammographic images were obtained following ultrasound guided biopsy of the targeted mass at the 5 o'clock position of the right breast as well as abnormal right axillary lymph node. The biopsy marking clip is in expected position at the site of biopsy in the posterior lower central right breast. Biopsy clip over the axilla is too deep to be included in the field of view. IMPRESSION: Appropriate positioning of the ribbon shaped biopsy marking clip at the site of biopsy in the lower central right breast. Nonvisualization of the post biopsy clip over the right axilla. Final Assessment: Post Procedure Mammograms for Marker Placement Electronically Signed   By: Marin Olp M.D.   On: 01/06/2021 08:58   [place] RT BREAST BX W LOC DEV 1ST LESION  IMG BX SPEC US GUIDE  Addendum Date: 01/09/2021   ADDENDUM REPORT: 01/09/2021 14:15 ADDENDUM: Pathology revealed GRADE III INVASIVE DUCTAL CARCINOMA of the Right breast, 5 o'clock, 6cmfn. This was found to be concordant by Dr. Marin Olp. Pathology revealed METASTATIC CARCINOMA INVOLVING A LYMPH NODE of the Right axilla. This was found to be concordant by Dr. Marin Olp. Pathology results were discussed with the patient by telephone. The patient reported doing well after the biopsies with tenderness at the sites. Post biopsy instructions and care were reviewed and questions were answered. The patient was encouraged to call [facility] for any additional concerns. My direct phone number was provided. The patient was referred to [facility] on January 18, 2021. Pathology results reported by Terie Purser, RN on 01/09/2021. Electronically Signed   By: Marin Olp M.D.   On: 01/09/2021 14:15   Result Date: 01/09/2021 CLINICAL DATA:  Patient presents for ultrasound-guided core needle biopsy 2 adjacent suspicious masses at the 5 o'clock position of the right breast 6 cm from the nipple measuring 2.2 cm together as well as biopsy of an abnormal right axillary lymph node. EXAM: ULTRASOUND GUIDED RIGHT BREAST CORE NEEDLE BIOPSY COMPARISON:  Previous exam(s). PROCEDURE: I met with the patient and we discussed the procedure of ultrasound-guided biopsy, including benefits and alternatives. We discussed the high likelihood of a successful procedure. We discussed the risks of the procedure, including infection, bleeding, tissue injury, clip migration, and inadequate sampling. Informed written consent was given. The usual time-out protocol was performed immediately prior to the procedure. 1.  Lesion quadrant: Right inner lower quadrant. Using sterile technique and 1% Lidocaine as local anesthetic, under direct ultrasound  visualization, a 12 gauge spring-loaded device was used to perform biopsy of the targeted suspicious mass over the 5 o'clock position of the right breast using a medial to lateral approach. At the conclusion of the procedure ribbon shaped tissue marker clip was deployed into the biopsy cavity. Follow up 2 view mammogram was performed and dictated separately. 2. Lesion quadrant: Right axilla. Using sterile technique and 1% Lidocaine as local anesthetic, under direct ultrasound visualization, a 14 gauge spring-loaded device was used to perform biopsy of the abnormal right axillary lymph node using a lateral to medial  approach. At the conclusion of the procedure tri bell tissue marker clip was deployed into the biopsy cavity. Follow up 2 view mammogram was performed and dictated separately. IMPRESSION: Ultrasound guided biopsy of a suspicious right breast mass at the 5 o'clock position as well as abnormal right axillary lymph node. No apparent complications. Electronically Signed: By: Marin Olp M.D. On: 01/06/2021 08:31      IMPRESSION/PLAN: Right breast cancer, locally advanced  It was a pleasure meeting the patient today. We discussed the risks, benefits, and side effects of radiotherapy. I recommend radiotherapy to the right breast and regional nodes to reduce her risk of locoregional recurrence by 2/3.  We discussed that radiation would take approximately 6 weeks to complete and that I would give the patient a few weeks to heal following surgery before starting treatment planning.  Chemotherapy will be given neoadjuvantly. We spoke about acute effects including skin irritation and fatigue as well as much less common late effects including internal organ injury or irritation. We spoke about the latest technology that is used to minimize the risk of late effects for patients undergoing radiotherapy to the breast or chest wall. No guarantees of treatment were given. The patient is enthusiastic about  proceeding with treatment. I look forward to participating in the patient's care.  I will await her referral back to me for postoperative follow-up and eventual CT simulation/treatment planning.  We discussed measures to reduce the risk of infection during the COVID-19 pandemic. She is eligible for her booster now and I recommended she receive it before starting chemotherapy.  She is enthusiastic to proceed. We will arrange that at the [facility] today in her left arm. Therapist, music)  On date of service, in total, I spent 45 minutes on this encounter. Patient was seen in person.   __________________________________________   Eppie Gibson, MD  This document serves as a record of services personally performed by Eppie Gibson, MD. It was created on his behalf by Clerance Lav, a trained medical scribe. The creation of this record is based on the scribe's personal observations and the provider's statements to them. This document has been checked and approved by the attending provider.

## 2021-01-18 NOTE — Therapy (Addendum)
[place], [place], [address] Phone: [phone]   Fax:  [phone]  Physical Therapy Evaluation  Patient Details  Name: Rebecca [name] MRN: [id number] Date of Birth: [birth year] Referring Provider (PT): Dr. Lurline Del   Encounter Date: 01/18/2021   PT End of Session - 01/18/21 1030    Visit Number 1    Number of Visits 2    Date for PT Re-Evaluation 07/21/21    PT Start Time 1054    PT Stop Time 1139    PT Time Calculation (min) 45 min    Activity Tolerance Patient tolerated treatment well    Behavior During Therapy [facility] for tasks assessed/performed           Past Medical History:  Diagnosis Date  . Arthritis   . Breast cancer ([place])   . Diabetes mellitus   . Family history of lymphoma 01/18/2021  . Family history of throat cancer 01/18/2021  . Hyperlipidemia   . Hypertension     Past Surgical History:  Procedure Laterality Date  . CESAREAN SECTION      There were no vitals filed for this visit.    Subjective Assessment - 01/18/21 0959    Subjective Patient reports she is here today to be seen by her medical team for her newly diagnosed right breast cancer.    Patient is accompained by: Interpreter   And friend   Pertinent History Patient was diagnosed on 01/03/2021 with right grade III invasive ductal carcinoma breast cancer. There are 2 areas that measure 1 cm and 1.1 cm and are located in the lower inner quadrant. It is weakly ER positive (10%), PR negative and HER2 negative with a Ki67 of 90%. She has a positive axillary lymph node.    Patient Stated Goals reduce lymphedema risk and lern post op shoulder ROM HEP    Currently in Pain? No/denies              [facility] PT Assessment - 01/18/21 0001      Assessment   Medical Diagnosis Right breast cancer    Referring Provider (PT) Dr. Sarajane Jews Magrinat    Onset Date/Surgical Date 01/03/21    Hand Dominance Left    Prior  Therapy none      Precautions   Precautions Other (comment)    Precaution Comments active cancer      Restrictions   Weight Bearing Restrictions No      Balance Screen   Has the patient fallen in the past 6 months No    Has the patient had a decrease in activity level because of a fear of falling?  No    Is the patient reluctant to leave their home because of a fear of falling?  No      Home Environment   Living Environment Private residence    Living Arrangements Other relatives   Nephew   Available Help at Discharge Family      Prior Function   Level of Independence Independent    Vocation Full time employment    Animator    Leisure She does not exercise      Cognition   Overall Cognitive Status Within Functional Limits for tasks assessed      Posture/Postural Control   Posture/Postural Control Postural limitations    Postural Limitations Rounded Shoulders;Forward head      ROM / Strength   AROM / PROM / Strength AROM;Strength  AROM   AROM Assessment Site Shoulder    Right/Left Shoulder Right;Left    Right Shoulder Extension 33 Degrees    Right Shoulder Flexion 153 Degrees    Right Shoulder ABduction 167 Degrees    Right Shoulder Internal Rotation 61 Degrees    Right Shoulder External Rotation 89 Degrees    Left Shoulder Extension 39 Degrees    Left Shoulder Flexion 137 Degrees    Left Shoulder ABduction 155 Degrees    Left Shoulder Internal Rotation 65 Degrees    Left Shoulder External Rotation 89 Degrees      Strength   Overall Strength Within functional limits for tasks performed             LYMPHEDEMA/ONCOLOGY QUESTIONNAIRE - 01/18/21 0001      Type   Cancer Type Right breast cancer      Lymphedema Assessments   Lymphedema Assessments Upper extremities      Right Upper Extremity Lymphedema   10 cm Proximal to Olecranon Process 26.4 cm    Olecranon Process 22.5 cm    10 cm Proximal to Ulnar Styloid Process  19 cm    Just Proximal to Ulnar Styloid Process 14.4 cm    Across Hand at [employer] 17.7 cm    At [place] of 2nd Digit 6 cm      Left Upper Extremity Lymphedema   10 cm Proximal to Olecranon Process 26.7 cm    Olecranon Process 23.1 cm    10 cm Proximal to Ulnar Styloid Process 19.5 cm    Just Proximal to Ulnar Styloid Process 15.1 cm    Across Hand at [employer] 18.1 cm    At [place] of 2nd Digit 6.2 cm           L-DEX FLOWSHEETS - 01/18/21 1000      L-DEX LYMPHEDEMA SCREENING   Measurement Type Unilateral    L-DEX MEASUREMENT EXTREMITY Upper Extremity    POSITION  Standing    DOMINANT SIDE Left    At Risk Side Right    BASELINE SCORE (UNILATERAL) -0.8           The patient was assessed using the L-Dex machine today to produce a lymphedema index baseline score. The patient will be reassessed on a regular basis (typically every 3 months) to obtain new L-Dex scores. If the score is > 6.5 points away from his/her baseline score indicating onset of subclinical lymphedema, it will be recommended to wear a compression garment for 4 weeks, 12 hours per day and then be reassessed. If the score continues to be > 6.5 points from baseline at reassessment, we will initiate lymphedema treatment. Assessing in this manner has a 95% rate of preventing clinically significant lymphedema.      Katina Dung - 01/18/21 0001    Open a tight or new jar Severe difficulty    Do heavy household chores (wash walls, wash floors) Mild difficulty    Carry a shopping bag or briefcase No difficulty    Wash your back No difficulty    Use a knife to cut food No difficulty    Recreational activities in which you take some force or impact through your arm, shoulder, or hand (golf, hammering, tennis) Mild difficulty    During the past week, to what extent has your arm, shoulder or hand problem interfered with your normal social activities with family, friends, neighbors, or groups? Not at all    During the  past week, to  what extent has your arm, shoulder or hand problem limited your work or other regular daily activities Slightly    Arm, shoulder, or hand pain. Moderate    Tingling (pins and needles) in your arm, shoulder, or hand Extreme    Difficulty Sleeping No difficulty    DASH Score 27.27 %            Objective measurements completed on examination: See above findings.        Patient was instructed today in a home exercise program today for post op shoulder range of motion. These included active assist shoulder flexion in sitting, scapular retraction, wall walking with shoulder abduction, and hands behind head external rotation.  She was encouraged to do these twice a day, holding 3 seconds and repeating 5 times when permitted by her physician.           PT Education - 01/18/21 1028    Education Details Lymphedema risk reduction and post op shoulder ROM HEP    Person(s) Educated Patient    Methods Explanation;Demonstration;Handout    Comprehension Returned demonstration;Verbalized understanding               PT Long Term Goals - 01/18/21 1204      PT LONG TERM GOAL #1   Title Patient will demonstrate she has regained full shoulder ROM and function post operatively compared to baselines.    Time 6    Period Months    Status New    Target Date 07/21/21           Breast Clinic Goals - 01/18/21 1204      Patient will be able to verbalize understanding of pertinent lymphedema risk reduction practices relevant to her diagnosis specifically related to skin care.   Time 1    Period Days    Status Achieved      Patient will be able to return demonstrate and/or verbalize understanding of the post-op home exercise program related to regaining shoulder range of motion.   Time 1    Period Days    Status Achieved      Patient will be able to verbalize understanding of the importance of attending the postoperative After Breast Cancer Class for further lymphedema  risk reduction education and therapeutic exercise.   Time 1    Period Days    Status Achieved                 Plan - 01/18/21 1030    Clinical Impression Statement Patient was diagnosed on 01/03/2021 with right grade III invasive ductal carcinoma breast cancer. There are 2 areas that measure 1 cm and 1.1 cm and are located in the lower inner quadrant. It is weakly ER positive (10%), PR negative and HER2 negative with a Ki67 of 90%. She has a positive axillary lymph node. Her multidisciplinary medical team met prior to her assessments to determine a recommended treatment plan. She is planning to have neoadjuvant chemotherapy followed by a right lumpectomy and targeted node biopsy and radiation. She will benefit from a post op PT reassessment to determine needs and from L-Dex screens every 3 months to detect subclinical lymphedema.    Stability/Clinical Decision Making Stable/Uncomplicated    Clinical Decision Making Low    Rehab Potential Excellent    PT Frequency --   Eval and 1 f/u visit   PT Treatment/Interventions ADLs/Self Care Home Management;Therapeutic exercise;Patient/family education    PT Next Visit Plan Will reassess 3-4 weeks post op  PT Home Exercise Plan Post op shoulder ROM HEP    Consulted and Agree with Plan of Care Patient;Family member/caregiver    Family Building surveyor and friend           Patient will benefit from skilled therapeutic intervention in order to improve the following deficits and impairments:  Postural dysfunction,Decreased range of motion,Impaired UE functional use,Pain,Decreased knowledge of precautions  Visit Diagnosis: Malignant neoplasm of lower-inner quadrant of right breast of female, estrogen receptor positive ([place]) - Plan: PT plan of care cert/re-cert  Abnormal posture - Plan: PT plan of care cert/re-cert   Patient will follow up at outpatient cancer rehab 3-4 weeks following surgery.  If the patient requires physical  therapy at that time, a specific plan will be dictated and sent to the referring physician for approval. The patient was educated today on appropriate basic range of motion exercises to begin post operatively and the importance of attending the After Breast Cancer class following surgery.  Patient was educated today on lymphedema risk reduction practices as it pertains to recommendations that will benefit the patient immediately following surgery.  She verbalized good understanding.      Problem List Patient Active Problem List   Diagnosis Date Noted  . Family history of throat cancer 01/18/2021  . Family history of lymphoma 01/18/2021  . Malignant neoplasm of lower-inner quadrant of right breast of female, estrogen receptor positive ([place]) 01/12/2021  . Posterior tibial tendinitis, right leg 01/26/2020  . Diabetes ([place]) 03/11/2017  . Muscle pain 08/06/2013  . Tiredness 08/06/2013  . Candida vaginitis 08/19/2012  . Dental cavities 08/19/2012  . Knee pain, bilateral 04/16/2012  . Diabetes mellitus type 2, controlled ([place]) 04/16/2012  . Hyperlipidemia 04/16/2012  . Palpitations 04/16/2012  . Back pain 04/16/2012   Annia Friendly, PT 01/18/21 2:37 PM  [place], [place], [address] Phone: [phone]   Fax:  [phone]  Name: Rebecca [name] MRN: [id number] Date of Birth: 09-Oct-1966

## 2021-01-19 ENCOUNTER — Encounter: Payer: Self-pay | Admitting: Radiation Oncology

## 2021-01-20 ENCOUNTER — Other Ambulatory Visit: Payer: Self-pay | Admitting: *Deleted

## 2021-01-20 ENCOUNTER — Telehealth: Payer: Self-pay | Admitting: Oncology

## 2021-01-20 MED ORDER — LORAZEPAM 0.5 MG PO TABS: 0.5000 mg | ORAL_TABLET | Freq: Every day | ORAL | 0 refills | Status: DC

## 2021-01-20 NOTE — Telephone Encounter (Signed)
Scheduled appts per 3/31 sch msg. Spoke to pt who is aware of upcoming education appt. Will have updated calendar printed for pt when she comes in.

## 2021-01-20 NOTE — Telephone Encounter (Signed)
   Jaelah A Lariviere DOB: 1966/09/21 MRN: [id number]   RIDER WAIVER AND RELEASE OF LIABILITY  For purposes of improving physical access to our facilities, [facility] is pleased to partner with third parties to provide [facility] patients or other authorized individuals the option of convenient, on-demand ground transportation services (the [place]") through use of the technology service that enables users to request on-demand ground transportation from independent third-party providers.  By opting to use and accept these [employer], I, the undersigned, hereby agree on behalf of myself, and on behalf of any minor child using the [employer] for whom I am the parent or legal guardian, as follows:  1. Government social research officer provided to me are provided by independent third-party transportation providers who are not [employer] or employees and who are unaffiliated with [employer]. 2. [facility] is neither a transportation carrier nor a common or public carrier. 3. [facility] has no control over the quality or safety of the transportation that occurs as a result of the [employer]. 4. [facility] cannot guarantee that any third-party transportation provider will complete any arranged transportation service. 5. [facility] makes no representation, warranty, or guarantee regarding the reliability, timeliness, quality, safety, suitability, or availability of any of the Transport Services or that they will be error free. 6. I fully understand that traveling by vehicle involves risks and dangers of serious bodily injury, including permanent disability, paralysis, and death. I agree, on behalf of myself and on behalf of any minor child using the Transport Services for whom I am the parent or legal guardian, that the entire risk arising out of my use of the [employer] remains solely with me, to the maximum extent permitted under applicable law. 7. The [employer] are provided "as is" and "as available." [facility] disclaims all representations and warranties, express, implied or statutory, not expressly set out in these terms, including the implied warranties of merchantability and fitness for a particular purpose. 8. I hereby waive and release [facility], its agents, employees, officers, directors, representatives, insurers, attorneys, assigns, successors, subsidiaries, and affiliates from any and all past, present, or future claims, demands, liabilities, actions, causes of action, or suits of any kind directly or indirectly arising from acceptance and use of the [employer]. 9. I further waive and release [facility] and its affiliates from all present and future liability and responsibility for any injury or death to persons or damages to property caused by or related to the use of the [employer]. 10. I have read this Waiver and Release of Liability, and I understand the terms used in it and their legal significance. This Waiver is freely and voluntarily given with the understanding that my right (as well as the right of any minor child for whom I am the parent or legal guardian using the [employer]) to legal recourse against  in connection with the [employer] is knowingly surrendered in return for use of these services.   I attest that I read the consent document to Rachel A Fleites, gave Ms. Papa the opportunity to ask questions and answered the questions asked (if any). I affirm that [facility] then provided consent for she's participation in this program.     Katy Apo

## 2021-01-21 ENCOUNTER — Other Ambulatory Visit (HOSPITAL_COMMUNITY): Payer: Self-pay

## 2021-01-21 MED FILL — Prochlorperazine Maleate Tab 10 MG (Base Equivalent): ORAL | 7 days supply | Qty: 30 | Fill #0 | Status: AC

## 2021-01-21 MED FILL — Lidocaine-Prilocaine Cream 2.5-2.5%: CUTANEOUS | 30 days supply | Qty: 30 | Fill #0 | Status: AC

## 2021-01-22 ENCOUNTER — Other Ambulatory Visit (HOSPITAL_COMMUNITY): Payer: Self-pay

## 2021-01-23 ENCOUNTER — Other Ambulatory Visit (HOSPITAL_COMMUNITY)
Admission: RE | Admit: 2021-01-23 | Discharge: 2021-01-23 | Disposition: A | Discharge status: Home / Self Care | Payer: 59 | Source: Ambulatory Visit | Attending: Surgery | Admitting: Surgery

## 2021-01-23 ENCOUNTER — Encounter: Payer: Self-pay | Admitting: Licensed Clinical Social Worker

## 2021-01-23 ENCOUNTER — Other Ambulatory Visit (HOSPITAL_COMMUNITY): Payer: Self-pay

## 2021-01-23 DIAGNOSIS — Z01812 Encounter for preprocedural laboratory examination: Secondary | ICD-10-CM | POA: Insufficient documentation

## 2021-01-23 DIAGNOSIS — Z20822 Contact with and (suspected) exposure to covid-19: Secondary | ICD-10-CM | POA: Diagnosis not present

## 2021-01-23 LAB — SARS CORONAVIRUS 2 (TAT 6-24 HRS): SARS Coronavirus 2: NEGATIVE

## 2021-01-23 MED ORDER — LORAZEPAM 0.5 MG PO TABS
ORAL_TABLET | Freq: Every evening | ORAL | 0 refills | Status: DC
  Filled 2021-01-23: qty 30, 30d supply, fill #0

## 2021-01-23 NOTE — Progress Notes (Signed)
[facility] [place] CSW Progress Note  Patient came to [facility] to drop off applications for breast cancer foundations. CSW submitted [employer] application and they will notify patient of assistance decision. Other applications need additional supporting paperwork. CSW explained to patient what is needed and she will work on gathering them and bring back to [place].    Christeen Douglas , LCSW

## 2021-01-25 ENCOUNTER — Encounter (HOSPITAL_COMMUNITY): Payer: Self-pay | Admitting: Surgery

## 2021-01-25 ENCOUNTER — Encounter: Payer: Self-pay | Admitting: Licensed Clinical Social Worker

## 2021-01-25 NOTE — Progress Notes (Signed)
[facility] CSW Progress Note  Clinical Education officer, museum received supporting paperwork for assistance applications from patient via her friend.  CSW submitted [employer] [place] and [employer] applications today. Waiting on lease agreement to submit with [facility] application. Friend plans to bring to the [facility] tomorrow.    Christeen Douglas , LCSW

## 2021-01-25 NOTE — Progress Notes (Signed)
Anesthesia Chart Review: Rebecca [name]   Case: [id number] Date/Time: 01/26/21 1015   Procedure: INSERTION PORT-A-CATH (N/A ) - 60   Anesthesia type: General   Pre-op diagnosis: POOR VENOUS ACCESS   Location: [facility] OR ROOM 06 / [place] OR   Surgeons: Erroll Luna, MD      DISCUSSION: Patient is a 55 year old female scheduled for the above procedure. Recent diagnosis of right breast cancer and needs Port-a-cath for chemotherapy. Currently it is anticipated that following chemotherapy she will undergo definitive surgery and adjuvant radiation.  History includes never smoker (with passive smoking exposure), DM2, HLD, HTN, right breast cancer (12/1820/22 right breast biopsy + invasive ductal carcinoma; + axillary LN).   By notes, she is originally from [place] and speak Arabic.  Pfizer COVID-19 vaccine 01/18/21.  01/23/2021 presurgical COVID-19 test negative.    She is a same day work-up. Labs and EKG as indicated on the day of surgery--she had CMET and CBC on 01/18/21. As of 01/03/21, GYN status listed as still having periods.    VS:  BP Readings from Last 3 Encounters:  01/18/21 135/79  12/27/20 (!) 163/85  11/14/20 127/86   Pulse Readings from Last 3 Encounters:  01/18/21 77  12/27/20 93  11/14/20 79    PROVIDERS: [name], MD is PCP. Annual exam 12/27/20.   Magrinat, Sarajane Jews, MD is Zonia Kief, MD is RAD-ONC   LABS: Labs as of 01/18/21 included: Lab Results  Component Value Date   WBC 7.2 01/18/2021   HGB 13.3 01/18/2021   HCT 41.2 01/18/2021   PLT 247 01/18/2021   GLUCOSE 111 (H) 01/18/2021   ALT 20 01/18/2021   AST 24 01/18/2021   NA 141 01/18/2021   K 3.9 01/18/2021   CL 102 01/18/2021   CREATININE 0.67 01/18/2021   BUN 9 01/18/2021   CO2 26 01/18/2021   HGBA1C 7.1 (H) 11/11/2020    EKG: Last EKG noted is > 1 year ago.    CV: Pre-chemo echocardiogram is scheduled for 01/31/21.    Past Medical History:  Diagnosis Date  . Arthritis   . Breast cancer  ([place])   . Diabetes mellitus   . Family history of lymphoma 01/18/2021  . Family history of throat cancer 01/18/2021  . Hyperlipidemia   . Hypertension     Past Surgical History:  Procedure Laterality Date  . CESAREAN SECTION      MEDICATIONS: No current facility-administered medications for this encounter.   Marland Kitchen amLODipine (NORVASC) 10 MG tablet  . Ascorbic Acid (VITAMIN C) 1000 MG tablet  . atorvastatin (LIPITOR) 80 MG tablet  . cyclobenzaprine (FLEXERIL) 10 MG tablet  . dapagliflozin propanediol (FARXIGA) 10 MG TABS tablet  . dexamethasone (DECADRON) 4 MG tablet  . ergocalciferol (DRISDOL) 1.25 MG (50000 UT) capsule  . glipiZIDE (GLUCOTROL) 5 MG tablet  . metFORMIN (GLUCOPHAGE) 500 MG tablet  . Omega-3 1000 MG CAPS  . prochlorperazine (COMPAZINE) 10 MG tablet  . vitamin B-12 (CYANOCOBALAMIN) 100 MCG tablet  . Blood Glucose Monitoring Suppl (ONETOUCH VERIO REFLECT) w/Device KIT  . cetirizine (ZYRTEC) 10 MG tablet  . cloNIDine (CATAPRES) 0.1 MG tablet  . clotrimazole (CLOTRIMAZOLE AF) 1 % cream  . glucose blood (ONETOUCH VERIO) test strip  . Lancets (ONETOUCH DELICA PLUS [id number]) MISC  . lidocaine-prilocaine (EMLA) cream  . LORazepam (ATIVAN) 0.5 MG tablet  . LORazepam (ATIVAN) 0.5 MG tablet  . naproxen (NAPROSYN) 500 MG tablet  . omeprazole (PRILOSEC) 20 MG capsule  . polyethylene glycol (  MIRALAX / GLYCOLAX) 17 g packet   By current medication list, she is not taking cetirizine, clonidine, Naprosyn, omeprazole, MiraLax.   Myra Gianotti, PA-C Surgical Short Stay/Anesthesiology [facility] Phone [phone] [facility] Phone [phone] 01/25/2021 11:18 AM

## 2021-01-25 NOTE — Progress Notes (Signed)
EKG: DOS CXR:12/17/13 ECHO:01/18/21 Stress Test: denies Cardiac Cath: denies  Fasting Blood Sugar-  Checks Blood Sugar___ times a day  OSA/CPAP: No  Asa/Blood Thinners: No  Covid test 4/4/ negative  Anesthesia Review: No  Patient denies shortness of breath, fever, cough, and chest pain at PAT appointment.  Patient verbalized understanding of instructions provided today at the PAT appointment.  Patient asked to review instructions at home and day of surgery.

## 2021-01-25 NOTE — Anesthesia Preprocedure Evaluation (Addendum)
Anesthesia Evaluation  Patient identified by MRN, date of birth, ID band Patient awake    Reviewed: Allergy & Precautions, NPO status , Patient's Chart, lab work & pertinent test results  History of Anesthesia Complications Negative for: history of anesthetic complications  Airway Mallampati: II  TM Distance: >3 FB Neck ROM: Full    Dental  (+) Teeth Intact   Pulmonary neg pulmonary ROS,    Pulmonary exam normal        Cardiovascular hypertension, Normal cardiovascular exam     Neuro/Psych negative neurological ROS     GI/Hepatic negative GI ROS, Neg liver ROS,   Endo/Other  diabetes, Type 2  Renal/GU negative Renal ROS  negative genitourinary   Musculoskeletal  (+) Arthritis ,   Abdominal   Peds  Hematology negative hematology ROS (+)   Anesthesia Other Findings  Right breast cancer  Reproductive/Obstetrics                           Anesthesia Physical Anesthesia Plan  ASA: II  Anesthesia Plan: General   Post-op Pain Management:    Induction: Intravenous  PONV Risk Score and Plan: 3 and Ondansetron, Dexamethasone, Midazolam and Treatment may vary due to age or medical condition  Airway Management Planned: LMA  Additional Equipment: None  Intra-op Plan:   Post-operative Plan: Extubation in OR  Informed Consent: I have reviewed the patients History and Physical, chart, labs and discussed the procedure including the risks, benefits and alternatives for the proposed anesthesia with the patient or authorized representative who has indicated his/her understanding and acceptance.     Dental advisory given  Plan Discussed with:   Anesthesia Plan Comments: (PAT note written 01/25/2021 by Myra Gianotti, PA-C. )       Anesthesia Quick Evaluation

## 2021-01-26 ENCOUNTER — Ambulatory Visit (HOSPITAL_COMMUNITY): Payer: 59 | Admitting: Vascular Surgery

## 2021-01-26 ENCOUNTER — Other Ambulatory Visit: Payer: 59

## 2021-01-26 ENCOUNTER — Ambulatory Visit (HOSPITAL_COMMUNITY)
Admission: RE | Admit: 2021-01-26 | Discharge: 2021-01-26 | Disposition: A | Discharge status: Home / Self Care | Payer: 59 | Attending: Surgery | Admitting: Surgery

## 2021-01-26 ENCOUNTER — Encounter: Payer: Self-pay | Admitting: Licensed Clinical Social Worker

## 2021-01-26 ENCOUNTER — Ambulatory Visit (HOSPITAL_COMMUNITY): Payer: 59

## 2021-01-26 ENCOUNTER — Other Ambulatory Visit: Payer: Self-pay

## 2021-01-26 ENCOUNTER — Encounter (HOSPITAL_COMMUNITY): Payer: Self-pay | Admitting: Surgery

## 2021-01-26 ENCOUNTER — Encounter (HOSPITAL_COMMUNITY)
Admission: RE | Disposition: A | Discharge status: Home / Self Care | Payer: Self-pay | Source: Home / Self Care | Attending: Surgery

## 2021-01-26 DIAGNOSIS — C50911 Malignant neoplasm of unspecified site of right female breast: Secondary | ICD-10-CM | POA: Insufficient documentation

## 2021-01-26 DIAGNOSIS — Z95828 Presence of other vascular implants and grafts: Secondary | ICD-10-CM

## 2021-01-26 DIAGNOSIS — Z419 Encounter for procedure for purposes other than remedying health state, unspecified: Secondary | ICD-10-CM

## 2021-01-26 DIAGNOSIS — E7800 Pure hypercholesterolemia, unspecified: Secondary | ICD-10-CM | POA: Diagnosis not present

## 2021-01-26 DIAGNOSIS — I10 Essential (primary) hypertension: Secondary | ICD-10-CM | POA: Insufficient documentation

## 2021-01-26 HISTORY — PX: PORTACATH PLACEMENT: SHX2246

## 2021-01-26 LAB — CBC WITH DIFFERENTIAL/PLATELET
Abs Immature Granulocytes: 0.01 10*3/uL (ref 0.00–0.07)
Basophils Absolute: 0 10*3/uL (ref 0.0–0.1)
Basophils Relative: 0 %
Eosinophils Absolute: 1.7 10*3/uL — ABNORMAL HIGH (ref 0.0–0.5)
Eosinophils Relative: 25 %
HCT: 41.6 % (ref 36.0–46.0)
Hemoglobin: 13.3 g/dL (ref 12.0–15.0)
Immature Granulocytes: 0 %
Lymphocytes Relative: 38 %
Lymphs Abs: 2.6 10*3/uL (ref 0.7–4.0)
MCH: 26.5 pg (ref 26.0–34.0)
MCHC: 32 g/dL (ref 30.0–36.0)
MCV: 83 fL (ref 80.0–100.0)
Monocytes Absolute: 0.3 10*3/uL (ref 0.1–1.0)
Monocytes Relative: 5 %
Neutro Abs: 2.2 10*3/uL (ref 1.7–7.7)
Neutrophils Relative %: 32 %
Platelets: 319 10*3/uL (ref 150–400)
RBC: 5.01 MIL/uL (ref 3.87–5.11)
RDW: 13.6 % (ref 11.5–15.5)
WBC: 6.7 10*3/uL (ref 4.0–10.5)
nRBC: 0 % (ref 0.0–0.2)

## 2021-01-26 LAB — COMPREHENSIVE METABOLIC PANEL
ALT: 15 U/L (ref 0–44)
AST: 23 U/L (ref 15–41)
Albumin: 4.1 g/dL (ref 3.5–5.0)
Alkaline Phosphatase: 48 U/L (ref 38–126)
Anion gap: 10 (ref 5–15)
BUN: 17 mg/dL (ref 6–20)
CO2: 24 mmol/L (ref 22–32)
Calcium: 9.4 mg/dL (ref 8.9–10.3)
Chloride: 104 mmol/L (ref 98–111)
Creatinine, Ser: 0.6 mg/dL (ref 0.44–1.00)
GFR, Estimated: >60 mL/min (ref >60)
Glucose, Bld: 118 mg/dL — ABNORMAL HIGH (ref 70–99)
Potassium: 4.1 mmol/L (ref 3.5–5.1)
Sodium: 138 mmol/L (ref 135–145)
Total Bilirubin: 0.5 mg/dL (ref 0.3–1.2)
Total Protein: 7.2 g/dL (ref 6.5–8.1)

## 2021-01-26 LAB — POCT PREGNANCY, URINE: Preg Test, Ur: NEGATIVE

## 2021-01-26 LAB — GLUCOSE, CAPILLARY
Glucose-Capillary: 111 mg/dL — ABNORMAL HIGH (ref 70–99)
Glucose-Capillary: 103 mg/dL — ABNORMAL HIGH (ref 70–99)
Glucose-Capillary: 109 mg/dL — ABNORMAL HIGH (ref 70–99)

## 2021-01-26 IMAGING — DX DG CHEST 1V PORT
1 series · 1 of 1 positions shown · non-contrast
Comparison: [DATE].

CLINICAL DATA: Status post Port-A-Cath insertion.

EXAM:
PORTABLE CHEST 1 VIEW

[chest ap]
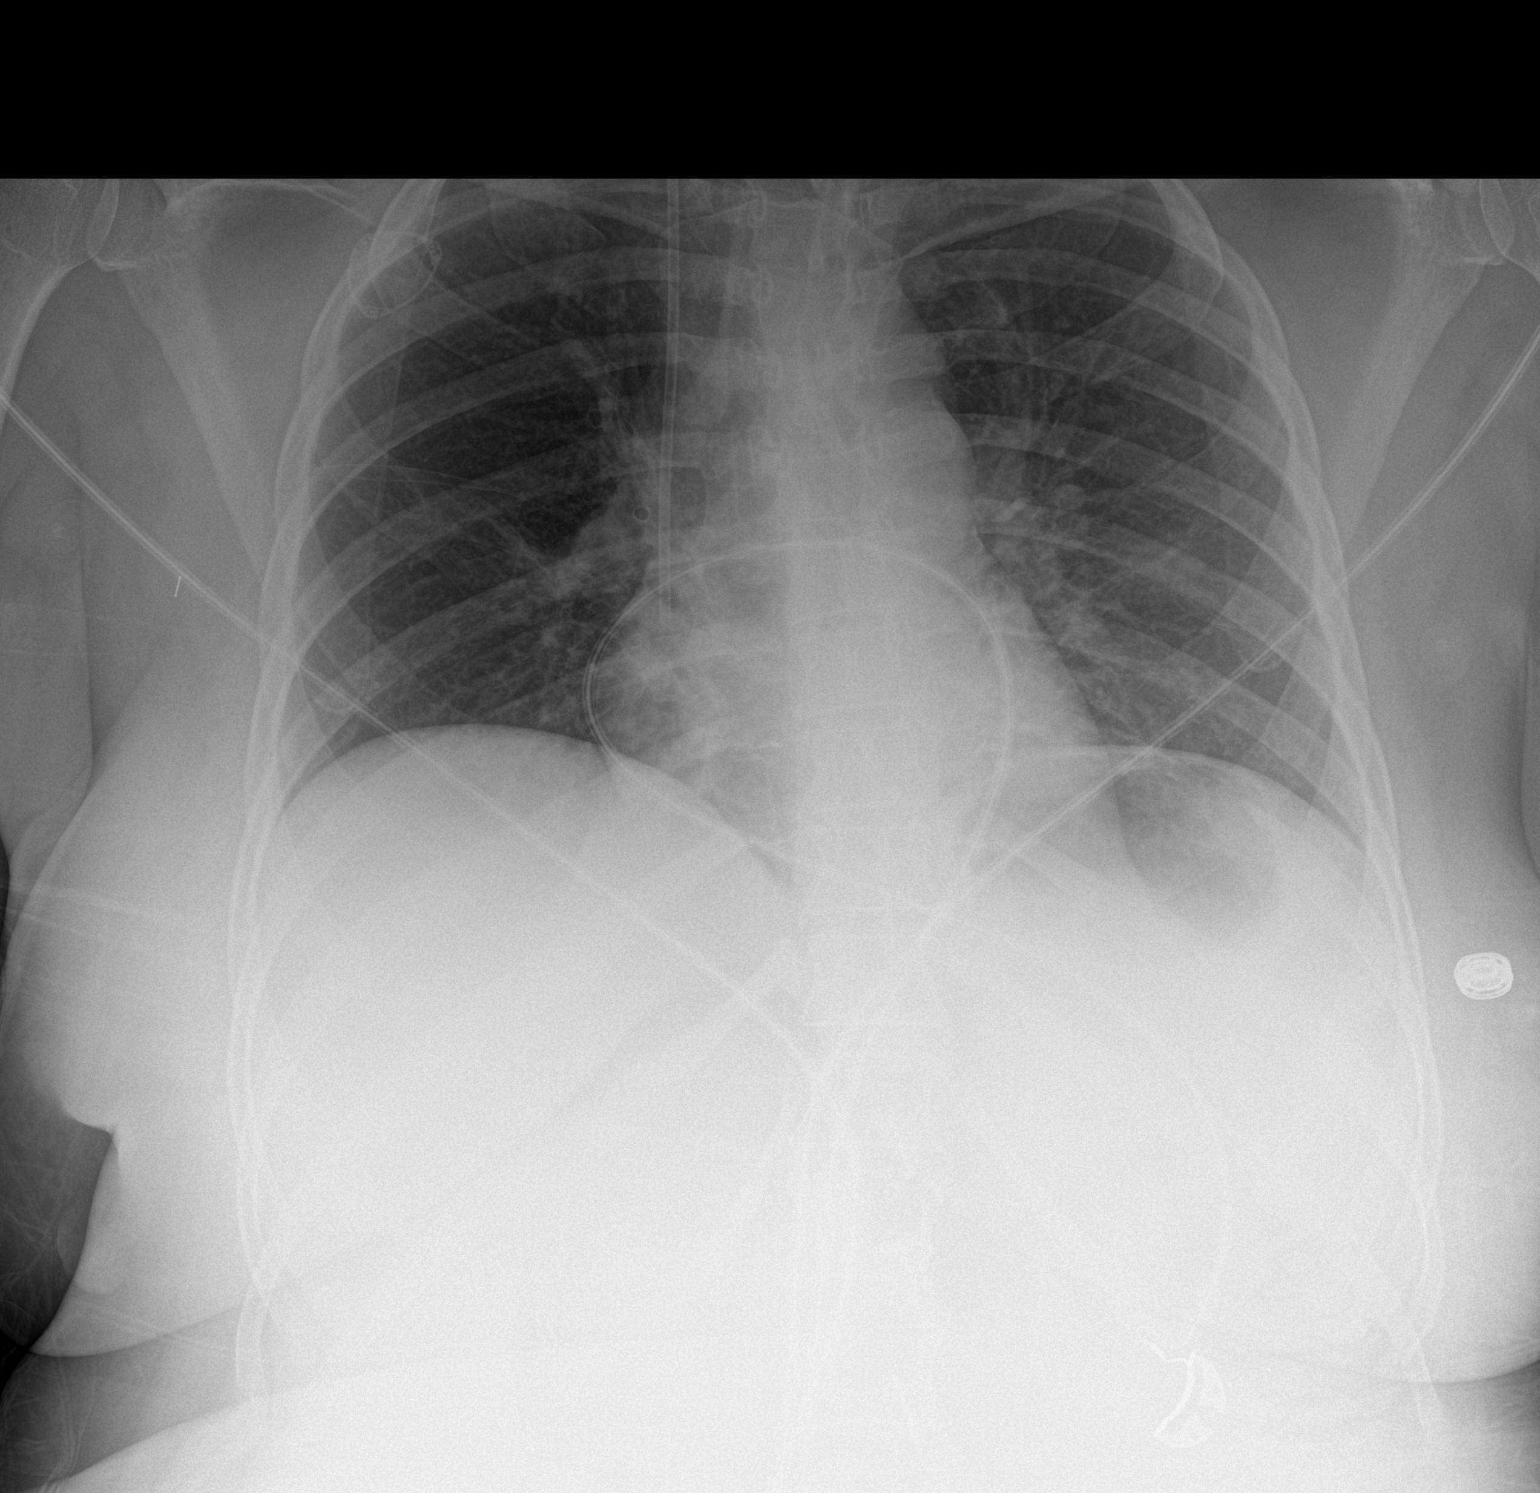

[1 of 1 positions shown; findings below may reference images not displayed]

FINDINGS: Right IJ approach Port-A-Cath with the tip projecting at the
superior right cavoatrial junction. No visible kink in the catheter.
No visible pneumothorax or pleural effusions. No consolidation.
Cardiomediastinal silhouette is within normal limits and similar to
prior.
IMPRESSION: 1. Right IJ approach Port-A-Cath with the tip projecting at the
superior right cavoatrial junction.
2. No acute cardiopulmonary disease. No visible pneumothorax.

## 2021-01-26 SURGERY — INSERTION, TUNNELED CENTRAL VENOUS DEVICE, WITH PORT
Anesthesia: General

## 2021-01-26 MED ORDER — SODIUM CHLORIDE 0.9 % IV SOLN: INTRAVENOUS | Status: DC | PRN

## 2021-01-26 MED ORDER — 0.9 % SODIUM CHLORIDE (POUR BTL) OPTIME
TOPICAL | Status: DC | PRN
  Administered 2021-01-26: 1000 mL

## 2021-01-26 MED ORDER — ONDANSETRON HCL 4 MG/2ML IJ SOLN
INTRAMUSCULAR | Status: AC
  Filled 2021-01-26: qty 2

## 2021-01-26 MED ORDER — CEFAZOLIN SODIUM-DEXTROSE 2-3 GM-%(50ML) IV SOLR
INTRAVENOUS | Status: DC | PRN
  Administered 2021-01-26: 2 g via INTRAVENOUS

## 2021-01-26 MED ORDER — ONDANSETRON HCL 4 MG/2ML IJ SOLN
INTRAMUSCULAR | Status: DC | PRN
  Administered 2021-01-26: 4 mg via INTRAVENOUS

## 2021-01-26 MED ORDER — OXYCODONE HCL 5 MG PO TABS: 5.0000 mg | ORAL_TABLET | Freq: Once | ORAL | Status: DC | PRN

## 2021-01-26 MED ORDER — ACETAMINOPHEN 500 MG PO TABS
1000.0000 mg | ORAL_TABLET | ORAL | Status: AC
  Administered 2021-01-26: 1000 mg via ORAL
  Filled 2021-01-26: qty 2

## 2021-01-26 MED ORDER — AMISULPRIDE (ANTIEMETIC) 5 MG/2ML IV SOLN: 10.0000 mg | Freq: Once | INTRAVENOUS | Status: DC | PRN

## 2021-01-26 MED ORDER — CHLORHEXIDINE GLUCONATE CLOTH 2 % EX PADS: 6.0000 | MEDICATED_PAD | Freq: Once | CUTANEOUS | Status: DC

## 2021-01-26 MED ORDER — CHLORHEXIDINE GLUCONATE 0.12 % MT SOLN: 15.0000 mL | Freq: Once | OROMUCOSAL | Status: AC

## 2021-01-26 MED ORDER — FENTANYL CITRATE (PF) 100 MCG/2ML IJ SOLN
INTRAMUSCULAR | Status: DC | PRN
  Administered 2021-01-26 (×2): 50 ug via INTRAVENOUS

## 2021-01-26 MED ORDER — CELECOXIB 200 MG PO CAPS
200.0000 mg | ORAL_CAPSULE | ORAL | Status: AC
  Administered 2021-01-26: 200 mg via ORAL
  Filled 2021-01-26: qty 1

## 2021-01-26 MED ORDER — OXYCODONE HCL 5 MG/5ML PO SOLN: 5.0000 mg | Freq: Once | ORAL | Status: DC | PRN

## 2021-01-26 MED ORDER — FENTANYL CITRATE (PF) 250 MCG/5ML IJ SOLN
INTRAMUSCULAR | Status: AC
  Filled 2021-01-26: qty 5

## 2021-01-26 MED ORDER — PROPOFOL 10 MG/ML IV BOLUS
INTRAVENOUS | Status: AC
  Filled 2021-01-26: qty 20

## 2021-01-26 MED ORDER — MIDAZOLAM HCL 2 MG/2ML IJ SOLN
INTRAMUSCULAR | Status: AC
  Filled 2021-01-26: qty 2

## 2021-01-26 MED ORDER — HEPARIN SOD (PORK) LOCK FLUSH 100 UNIT/ML IV SOLN
INTRAVENOUS | Status: AC
  Filled 2021-01-26: qty 5

## 2021-01-26 MED ORDER — HEPARIN SOD (PORK) LOCK FLUSH 100 UNIT/ML IV SOLN
INTRAVENOUS | Status: DC | PRN
  Administered 2021-01-26: 500 [IU]

## 2021-01-26 MED ORDER — MIDAZOLAM HCL 5 MG/5ML IJ SOLN
INTRAMUSCULAR | Status: DC | PRN
  Administered 2021-01-26: 2 mg via INTRAVENOUS

## 2021-01-26 MED ORDER — BUPIVACAINE-EPINEPHRINE (PF) 0.25% -1:200000 IJ SOLN
INTRAMUSCULAR | Status: AC
  Filled 2021-01-26: qty 30

## 2021-01-26 MED ORDER — LIDOCAINE 2% (20 MG/ML) 5 ML SYRINGE
INTRAMUSCULAR | Status: DC | PRN
  Administered 2021-01-26: 60 mg via INTRAVENOUS

## 2021-01-26 MED ORDER — BUPIVACAINE-EPINEPHRINE 0.25% -1:200000 IJ SOLN
INTRAMUSCULAR | Status: DC | PRN
  Administered 2021-01-26: 30 mL

## 2021-01-26 MED ORDER — ONDANSETRON HCL 4 MG/2ML IJ SOLN: 4.0000 mg | Freq: Once | INTRAMUSCULAR | Status: DC | PRN

## 2021-01-26 MED ORDER — CHLORHEXIDINE GLUCONATE 0.12 % MT SOLN
OROMUCOSAL | Status: AC
  Administered 2021-01-26: 15 mL via OROMUCOSAL
  Filled 2021-01-26: qty 15

## 2021-01-26 MED ORDER — LACTATED RINGERS IV SOLN: INTRAVENOUS | Status: DC

## 2021-01-26 MED ORDER — FENTANYL CITRATE (PF) 100 MCG/2ML IJ SOLN: 25.0000 ug | INTRAMUSCULAR | Status: DC | PRN

## 2021-01-26 MED ORDER — ORAL CARE MOUTH RINSE: 15.0000 mL | Freq: Once | OROMUCOSAL | Status: AC

## 2021-01-26 MED ORDER — OXYCODONE HCL 5 MG PO TABS: 5.0000 mg | ORAL_TABLET | Freq: Four times a day (QID) | ORAL | 0 refills | Status: DC | PRN

## 2021-01-26 MED ORDER — PROPOFOL 10 MG/ML IV BOLUS
INTRAVENOUS | Status: DC | PRN
  Administered 2021-01-26: 140 mg via INTRAVENOUS

## 2021-01-26 MED ORDER — CEFAZOLIN SODIUM-DEXTROSE 2-4 GM/100ML-% IV SOLN
2.0000 g | INTRAVENOUS | Status: DC
  Filled 2021-01-26: qty 100

## 2021-01-26 MED ORDER — LIDOCAINE 2% (20 MG/ML) 5 ML SYRINGE
INTRAMUSCULAR | Status: AC
  Filled 2021-01-26: qty 5

## 2021-01-26 SURGICAL SUPPLY — 41 items
ADH SKN CLS APL DERMABOND .7 (GAUZE/BANDAGES/DRESSINGS) ×1
APL PRP STRL LF DISP 70% ISPRP (MISCELLANEOUS) ×1
BAG DECANTER FOR FLEXI CONT (MISCELLANEOUS) ×2 IMPLANT
CHLORAPREP W/TINT 26 (MISCELLANEOUS) ×2 IMPLANT
COVER SURGICAL LIGHT HANDLE (MISCELLANEOUS) ×2 IMPLANT
COVER TRANSDUCER ULTRASND GEL (DISPOSABLE) ×2 IMPLANT
COVER WAND RF STERILE (DRAPES) ×2 IMPLANT
DERMABOND ADVANCED (GAUZE/BANDAGES/DRESSINGS) ×1
DERMABOND ADVANCED .7 DNX12 (GAUZE/BANDAGES/DRESSINGS) ×1 IMPLANT
DRAPE C-ARM 42X120 X-RAY (DRAPES) ×2 IMPLANT
ELECT CAUTERY BLADE 6.4 (BLADE) ×2 IMPLANT
ELECT REM PT RETURN 9FT ADLT (ELECTROSURGICAL) ×2
ELECTRODE REM PT RTRN 9FT ADLT (ELECTROSURGICAL) ×1 IMPLANT
GAUZE 4X4 16PLY RFD (DISPOSABLE) ×2 IMPLANT
GEL ULTRASOUND 20GR AQUASONIC (MISCELLANEOUS) IMPLANT
GLOVE BIO SURGEON STRL SZ8 (GLOVE) ×2 IMPLANT
GLOVE SRG 8 PF TXTR STRL LF DI (GLOVE) ×1 IMPLANT
GLOVE SURG UNDER POLY LF SZ8 (GLOVE) ×2
GOWN STRL REUS W/ TWL LRG LVL3 (GOWN DISPOSABLE) ×1 IMPLANT
GOWN STRL REUS W/ TWL XL LVL3 (GOWN DISPOSABLE) ×1 IMPLANT
GOWN STRL REUS W/TWL LRG LVL3 (GOWN DISPOSABLE) ×2
GOWN STRL REUS W/TWL XL LVL3 (GOWN DISPOSABLE) ×2
INTRODUCER COOK 11FR (CATHETERS) IMPLANT
KIT BASIN OR (CUSTOM PROCEDURE TRAY) ×2 IMPLANT
KIT PORT POWER 8FR ISP CVUE (Port) ×1 IMPLANT
KIT TURNOVER KIT B (KITS) ×2 IMPLANT
NS IRRIG 1000ML POUR BTL (IV SOLUTION) ×2 IMPLANT
PAD ARMBOARD 7.5X6 YLW CONV (MISCELLANEOUS) ×2 IMPLANT
PENCIL BUTTON HOLSTER BLD 10FT (ELECTRODE) ×2 IMPLANT
POSITIONER HEAD DONUT 9IN (MISCELLANEOUS) ×2 IMPLANT
SET INTRODUCER 12FR PACEMAKER (INTRODUCER) IMPLANT
SET SHEATH INTRODUCER 10FR (MISCELLANEOUS) IMPLANT
SHEATH COOK PEEL AWAY SET 9F (SHEATH) IMPLANT
SUT MNCRL AB 4-0 PS2 18 (SUTURE) ×2 IMPLANT
SUT PROLENE 2 0 SH 30 (SUTURE) ×2 IMPLANT
SUT VIC AB 3-0 SH 27 (SUTURE) ×2
SUT VIC AB 3-0 SH 27X BRD (SUTURE) ×1 IMPLANT
SYR 5ML LUER SLIP (SYRINGE) ×2 IMPLANT
TOWEL GREEN STERILE (TOWEL DISPOSABLE) ×2 IMPLANT
TOWEL GREEN STERILE FF (TOWEL DISPOSABLE) ×2 IMPLANT
TRAY LAPAROSCOPIC MC (CUSTOM PROCEDURE TRAY) ×2 IMPLANT

## 2021-01-26 NOTE — Anesthesia Postprocedure Evaluation (Signed)
Anesthesia Post Note  Patient: Rebecca [name]  Procedure(s) Performed: INSERTION PORT-A-CATH (N/A )     Patient location during evaluation: PACU Anesthesia Type: General Level of consciousness: awake and alert Pain management: pain level controlled Vital Signs Assessment: post-procedure vital signs reviewed and stable Respiratory status: spontaneous breathing, nonlabored ventilation and respiratory function stable Cardiovascular status: blood pressure returned to baseline and stable Postop Assessment: no apparent nausea or vomiting Anesthetic complications: no   No complications documented.  Last Vitals:  Vitals:   01/26/21 1316 01/26/21 1331  BP: 127/81 120/80  Pulse: 62 64  Resp: 13 14  Temp: 36.6 C   SpO2: 100% 100%    Last Pain:  Vitals:   01/26/21 1316  PainSc: 0-No pain                 Lidia Collum

## 2021-01-26 NOTE — Transfer of Care (Signed)
Immediate Anesthesia Transfer of Care Note  Patient: Rebecca [name]  Procedure(s) Performed: INSERTION PORT-A-CATH (N/A )  Patient Location: PACU  Anesthesia Type:General  Level of Consciousness: drowsy  Airway & Oxygen Therapy: Patient Spontanous Breathing and Patient connected to face mask oxygen  Post-op Assessment: Report given to RN and Post -op Vital signs reviewed and stable  Post vital signs: Reviewed and stable  Last Vitals:  Vitals Value Taken Time  BP 132/78 01/26/21 1246  Temp    Pulse 87 01/26/21 1248  Resp 14 01/26/21 1248  SpO2 100 % 01/26/21 1248  Vitals shown include unvalidated device data.  Last Pain:  Vitals:   01/26/21 4514  PainSc: 0-No pain         Complications: No complications documented.

## 2021-01-26 NOTE — Anesthesia Procedure Notes (Signed)
Procedure Name: LMA Insertion Date/Time: 01/26/2021 11:41 AM Performed by: Hoy Morn, CRNA Pre-anesthesia Checklist: Patient identified, Emergency Drugs available, Suction available and Patient being monitored Patient Re-evaluated:Patient Re-evaluated prior to induction Oxygen Delivery Method: Circle system utilized Preoxygenation: Pre-oxygenation with 100% oxygen Induction Type: IV induction Ventilation: Mask ventilation without difficulty LMA: LMA inserted LMA Size: 4.0 Number of attempts: 1 Placement Confirmation: positive ETCO2 and breath sounds checked- equal and bilateral Tube secured with: Tape Dental Injury: Teeth and Oropharynx as per pre-operative assessment

## 2021-01-26 NOTE — Progress Notes (Signed)
[facility] CSW Progress Note  Clinical Education officer, museum received final paperwork for [employer] application. Mailed completed application today. They will notify patient of progress as application is processed.    Christeen Douglas , LCSW

## 2021-01-26 NOTE — Op Note (Signed)
preoperative diagnosis: PAC needed for chemotherapy   Postoperative diagnosis: Same  Procedure: Portacath Placement with C arm and U/S guidance   Surgeon: Turner Daniels, MD, FACS  Anesthesia: General and 0.25 % marcaine with epinephrine  Clinical History and Indications: The patient is getting ready to begin chemotherapy for her cancer. She  needs a Port-A-Cath for venous access. Risk of bleeding, infection,  Collapse lung,  Death,  DVT,  Organ injury,  Mediastinal injury,  Injury to heart,  Injury to blood vessels,  Nerves,  Migration of catheter,  Embolization of catheter and the need for more surgery.  Description of Procedure: I have seen the patient in the holding area and confirmed the plans for the procedure as noted above. I reviewed the risks and complications again and the patient has no further questions. She wishes to proceed.   The patient was then taken to the operating room. After satisfactory general  anesthesia had been obtained the upper chest and lower neck were prepped and draped as a sterile field. The timeout was done.  The right internal jugular vein  was entered under U/S guidance  and the guidewire threaded into the superior vena cava right atrial area under fluoroscopic guidance. An incision was then made on the anterior chest wall and a subcutaneous pocket fashioned for the port reservoir.  The port tubing was then brought through a subcutaneous tunnel from the port site to the guidewire site.  The port and catheter were attached, locked  and flushed. The catheter was measured and cut to appropriate length.The dilator and peel-away sheath were then advanced over the guidewire while monitoring this with fluoroscopy. The guidewire and dilator were removed and the tubing threaded to approximately 20 cm. The peel-away sheath was then removed. The catheter aspirated and flushed easily. Using fluoroscopy the tip was in the superior vena cava right atrial junction area. It  aspirated and flushed easily. That aspirated and flushed easily.  The reservoir was secured to the fascia with 1 sutures of 2-0 Prolene. A final check with fluoroscopy was done to make sure we had no kinks and good positioning of the tip of the catheter. Everything appeared to be okay. The catheter was aspirated, flushed with dilute heparin and then concentrated aqueous heparin.  The incision was then closed with interrupted 3-0 Vicryl, and 4-0 Monocryl subcuticular with Dermabond on the skin.  There were no operative complications. Estimated blood loss was minimal. All counts were correct. The patient tolerated the procedure well.  Turner Daniels, MD, FACS

## 2021-01-26 NOTE — Interval H&P Note (Signed)
History and Physical Interval Note:  01/26/2021 8:56 AM  Rebecca [name]  has presented today for surgery, with the diagnosis of POOR VENOUS ACCESS.  The various methods of treatment have been discussed with the patient and family. After consideration of risks, benefits and other options for treatment, the patient has consented to  Procedure(s) with comments: INSERTION PORT-A-CATH (N/A) - 60 as a surgical intervention.  The patient's history has been reviewed, patient examined, no change in status, stable for surgery.  I have reviewed the patient's chart and labs.  Questions were answered to the patient's satisfaction.   Risk of bleeding, infection, collapse collapse lung, cardiac injury  death DVT and need for further surgery.   Turner Daniels MD

## 2021-01-26 NOTE — Discharge Instructions (Signed)
    PORT-A-CATH: POST OP INSTRUCTIONS  Always review your discharge instruction sheet given to you by the facility where your surgery was performed.   1. A prescription for pain medication may be given to you upon discharge. Take your pain medication as prescribed, if needed. If narcotic pain medicine is not needed, then you make take acetaminophen (Tylenol) or ibuprofen (Advil) as needed.  2. Take your usually prescribed medications unless otherwise directed. 3. If you need a refill on your pain medication, please contact our office. All narcotic pain medicine now requires a paper prescription.  Phoned in and fax refills are no longer allowed by law.  Prescriptions will not be filled after 5 pm or on weekends.  4. You should follow a light diet for the remainder of the day after your procedure. 5. Most patients will experience some mild swelling and/or bruising in the area of the incision. It may take several days to resolve. 6. It is common to experience some constipation if taking pain medication after surgery. Increasing fluid intake and taking a stool softener (such as Colace) will usually help or prevent this problem from occurring. A mild laxative (Milk of Magnesia or Miralax) should be taken according to package directions if there are no bowel movements after 48 hours.  7. Unless discharge instructions indicate otherwise, you may remove your bandages 48 hours after surgery, and you may shower at that time. You may have steri-strips (small white skin tapes) in place directly over the incision.  These strips should be left on the skin for 7-10 days.  If your surgeon used Dermabond (skin glue) on the incision, you may shower in 24 hours.  The glue will flake off over the next 2-3 weeks.  8. If your port is left accessed at the end of surgery (needle left in port), the dressing cannot get wet and should only by changed by a healthcare professional. When the port is no longer accessed (when the  needle has been removed), follow step 7.   9. ACTIVITIES:  Limit activity involving your arms for the next 72 hours. Do no strenuous exercise or activity for 1 week. You may drive when you are no longer taking prescription pain medication, you can comfortably wear a seatbelt, and you can maneuver your car. 10.You may need to see your doctor in the office for a follow-up appointment.  Please       check with your doctor.  11.When you receive a new Port-a-Cath, you will get a product guide and        ID card.  Please keep them in case you need them.  WHEN TO CALL YOUR DOCTOR ([phone]): 1. Fever over 101.0 2. Chills 3. Continued bleeding from incision 4. Increased redness and tenderness at the site 5. Shortness of breath, difficulty breathing   The clinic staff is available to answer your questions during regular business hours. Please don't hesitate to call and ask to speak to one of the nurses or medical assistants for clinical concerns. If you have a medical emergency, go to the nearest emergency room or call 911.  A surgeon from [facility] is always on call at the hospital.     For further information, please visit [email or web address]      

## 2021-01-27 ENCOUNTER — Encounter (HOSPITAL_COMMUNITY): Payer: 59

## 2021-01-27 ENCOUNTER — Encounter (HOSPITAL_COMMUNITY): Payer: Self-pay | Admitting: Surgery

## 2021-01-27 ENCOUNTER — Ambulatory Visit (HOSPITAL_COMMUNITY)
Admission: RE | Admit: 2021-01-27 | Discharge: 2021-01-27 | Disposition: A | Discharge status: Home / Self Care | Payer: 59 | Source: Ambulatory Visit | Attending: Oncology | Admitting: Oncology

## 2021-01-27 DIAGNOSIS — Z170 Estrogen receptor positive status [ER+]: Secondary | ICD-10-CM | POA: Diagnosis present

## 2021-01-27 DIAGNOSIS — C50311 Malignant neoplasm of lower-inner quadrant of right female breast: Secondary | ICD-10-CM | POA: Diagnosis present

## 2021-01-27 IMAGING — CT CT CHEST W/ CM
2 of 4 series · 15 of 36 positions shown, 18 images · IV contrast (OMNIPAQUE)
Comparison: Chest radiograph from one day prior.

CLINICAL DATA: New diagnosis of right breast cancer. Staging
evaluation.

EXAM:
CT CHEST WITH CONTRAST
TECHNIQUE: Multidetector CT imaging of the chest was performed during
intravenous contrast administration.
CONTRAST:  75mL OMNIPAQUE IOHEXOL 300 MG/ML  SOLN

[Series 2: axial st · axial · 0.72mm/px · z∈[+60,+282]mm · 12 of 131 slices shown, 15 images]
[im 10/131  mediastinal]
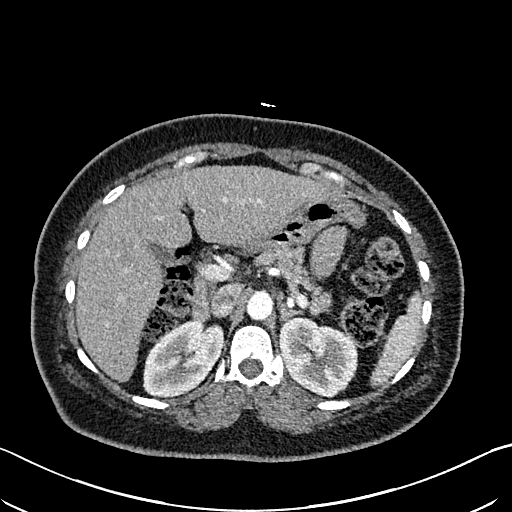
[im 10/131  lung]
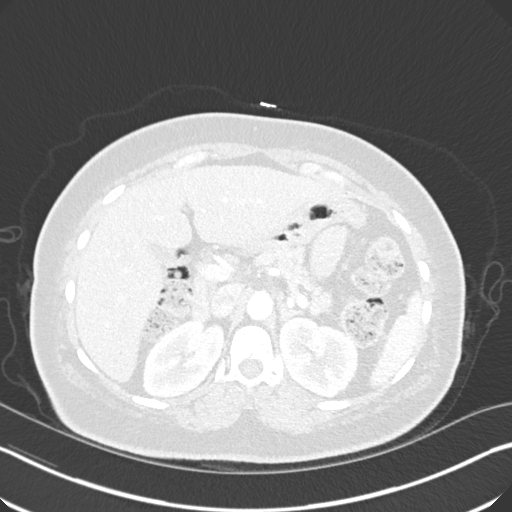
[im 19/131  lung]
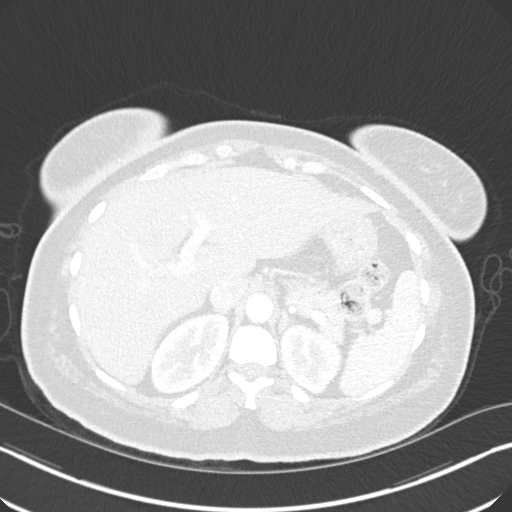
[im 28/131  lung]
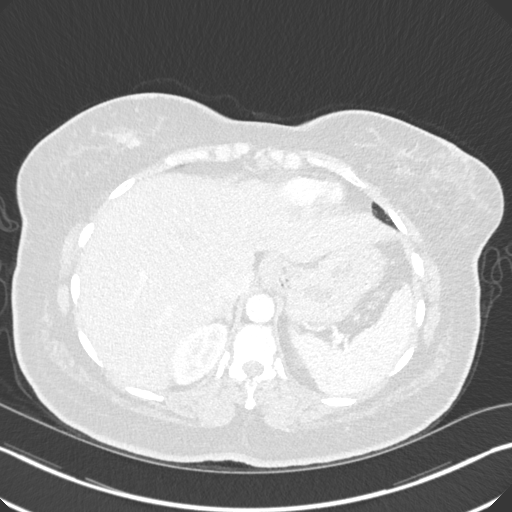
[im 38/131  lung]
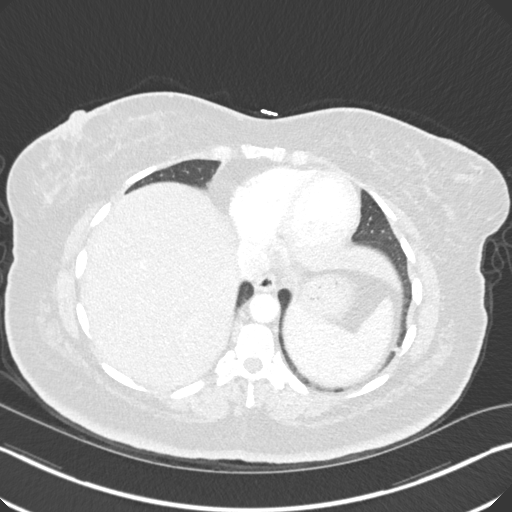
[im 47/131  mediastinal]
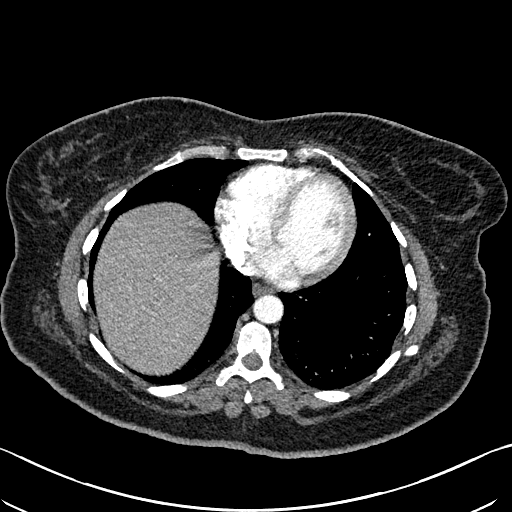
[im 47/131  lung]
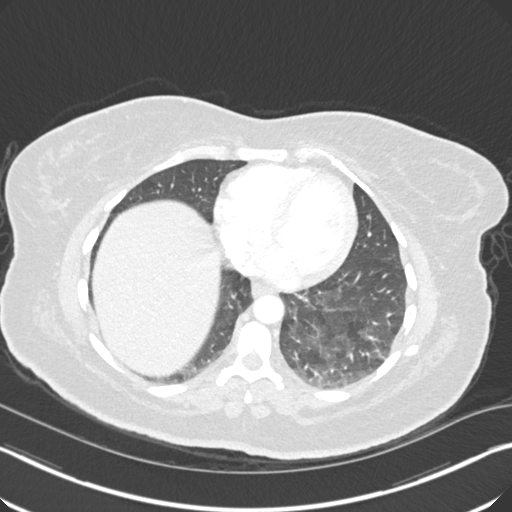
[im 56/131  lung]
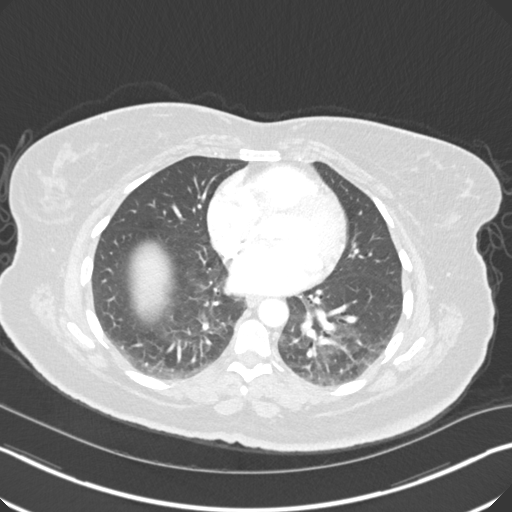
[im 75/131  lung]
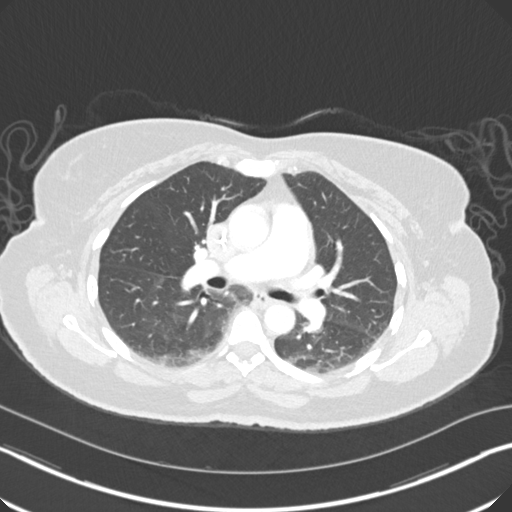
[im 84/131  lung]
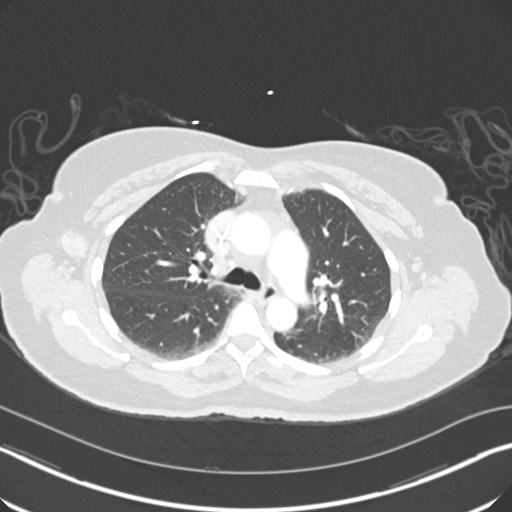
[im 93/131  mediastinal]
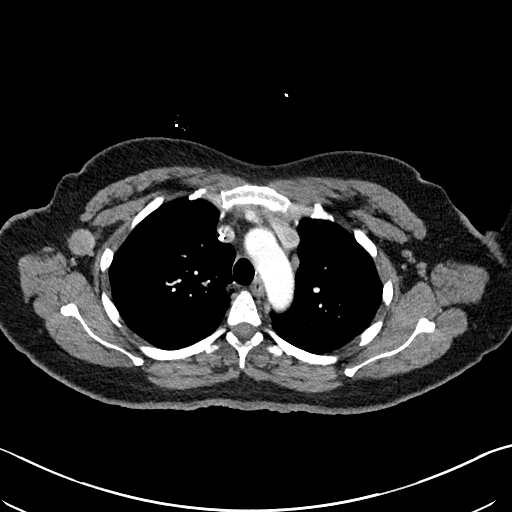
[im 93/131  lung]
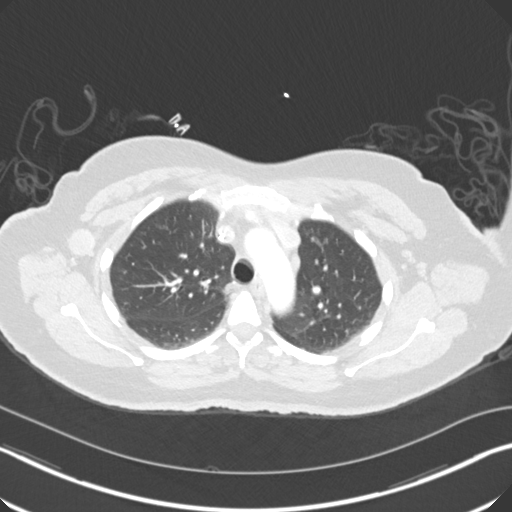
[im 103/131  lung]
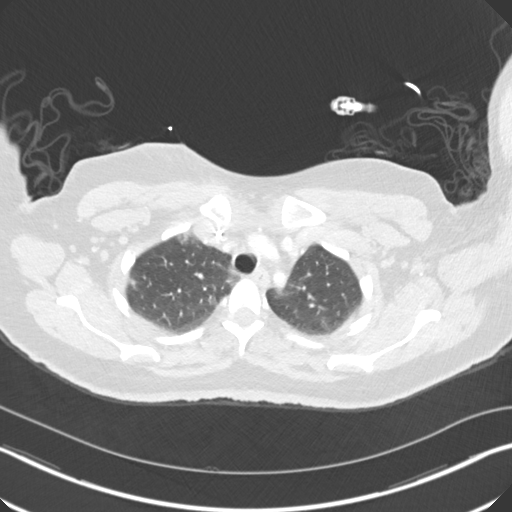
[im 112/131  lung]
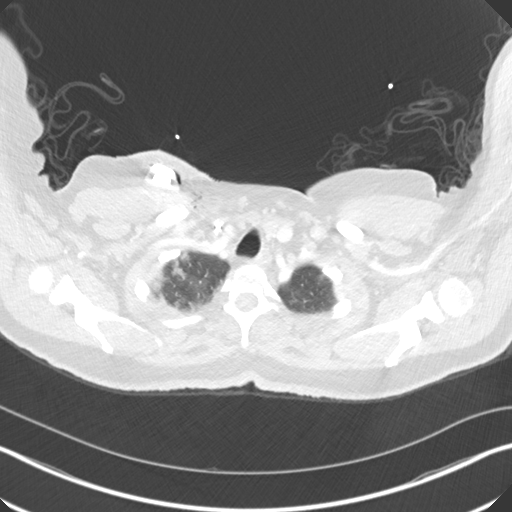
[im 121/131  lung]
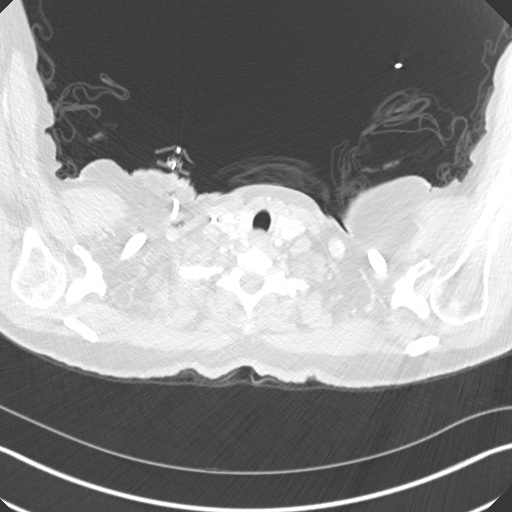

[Series 6: coronal · coronal · 0.64mm/px · 3 of 107 slices shown]
[im 22/107  lung]
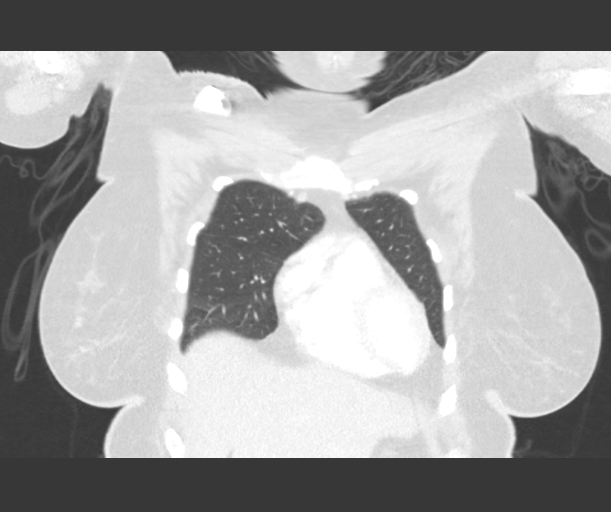
[im 43/107  lung]
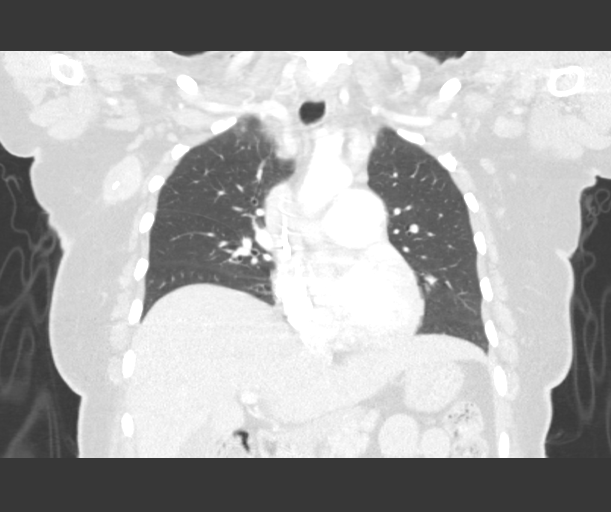
[im 64/107  lung]
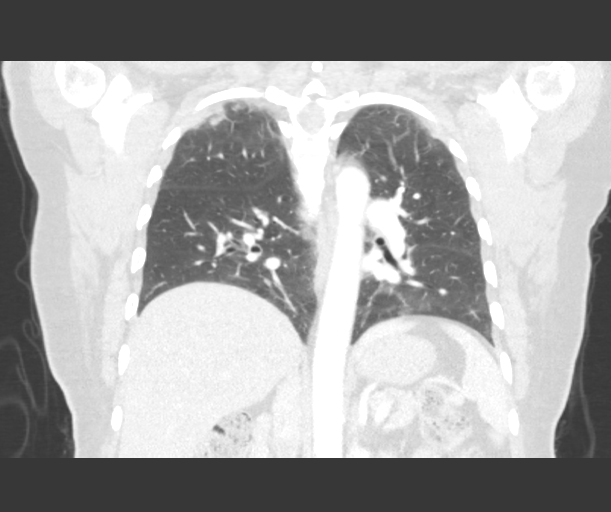

[15 of 36 positions shown; findings below may reference images not displayed]

FINDINGS: Cardiovascular: Normal heart size. No significant pericardial
effusion/thickening. Right internal jugular Port-A-Cath terminates
in the lower third of the SVC. Expected mild subcutaneous emphysema
surrounding the port in the supraclavicular right neck compatible
with recent port placement. Normal course and caliber of the
thoracic aorta. Dilated main pulmonary artery (3.5 cm diameter). No
central pulmonary emboli.

Mediastinum/Nodes: No discrete thyroid nodules. Unremarkable
esophagus. Enlarged 2.4 cm short axis diameter right axillary node
with internal biopsy clip (series 2/image 43). No additional
pathologically enlarged axillary nodes. No pathologically enlarged
mediastinal or hilar nodes.

Lungs/Pleura: No pneumothorax. No pleural effusion. No acute
consolidative airspace disease, lung masses or significant pulmonary
nodules. Mild patchy subpleural ground-glass opacity and
reticulation at both lung apices, right greater than left. Mild
hypoventilatory changes in the dependent lower lobes.

Upper abdomen: No acute abnormality.

Musculoskeletal:  No aggressive appearing focal osseous lesions.
IMPRESSION: 1. Moderately enlarged right axillary lymph node with internal
biopsy clip, compatible with right axillary nodal metastasis.
2. No additional sites of metastatic disease in the chest.
3. Mild patchy subpleural ground-glass opacity and reticulation at
both lung apices, right greater than left, nonspecific, favor
postinfectious scarring.
4. Dilated main pulmonary artery, suggesting pulmonary arterial
hypertension.

## 2021-01-27 MED ORDER — HEPARIN SOD (PORK) LOCK FLUSH 100 UNIT/ML IV SOLN
INTRAVENOUS | Status: AC
  Administered 2021-01-27: 500 [IU]
  Filled 2021-01-27: qty 5

## 2021-01-27 MED ORDER — IOHEXOL 300 MG/ML  SOLN
75.0000 mL | Freq: Once | INTRAMUSCULAR | Status: AC | PRN
  Administered 2021-01-27: 75 mL via INTRAVENOUS

## 2021-01-27 MED ORDER — HEPARIN SOD (PORK) LOCK FLUSH 100 UNIT/ML IV SOLN: 500.0000 [IU] | Freq: Once | INTRAVENOUS | Status: DC

## 2021-01-30 ENCOUNTER — Encounter: Payer: Self-pay | Admitting: *Deleted

## 2021-01-30 ENCOUNTER — Other Ambulatory Visit: Payer: Self-pay

## 2021-01-30 ENCOUNTER — Ambulatory Visit
Admission: RE | Admit: 2021-01-30 | Discharge: 2021-01-30 | Disposition: A | Discharge status: Home / Self Care | Payer: 59 | Source: Ambulatory Visit | Attending: Oncology | Admitting: Oncology

## 2021-01-30 DIAGNOSIS — Z170 Estrogen receptor positive status [ER+]: Secondary | ICD-10-CM

## 2021-01-30 DIAGNOSIS — C50311 Malignant neoplasm of lower-inner quadrant of right female breast: Secondary | ICD-10-CM

## 2021-01-30 IMAGING — MR MR BREAST BILAT WO/W CM
8 of 12 series · 33 of 48 positions shown · IV contrast (6ml Gadavist)
Comparison: Prior mammograms and ultrasounds

CLINICAL DATA: 54-year-old female with newly diagnosed RIGHT breast
cancer and metastatic carcinoma within a RIGHT axillary lymph node.

LABS:  Not performed
EXAM:
BILATERAL BREAST MRI WITH AND WITHOUT CONTRAST
TECHNIQUE: Multiplanar, multisequence MR images of both breasts were obtained
prior to and following the intravenous administration of 6 ml of
Gadavist

[Series 4: fl3d pre-cm no · axial · non-contrast · 1.2mm · 0.94mm/px · z∈[-49,+122]mm · 5 of 144 slices shown]
[im 1/144]
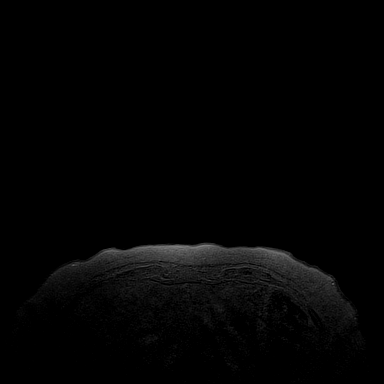
[im 36/144]
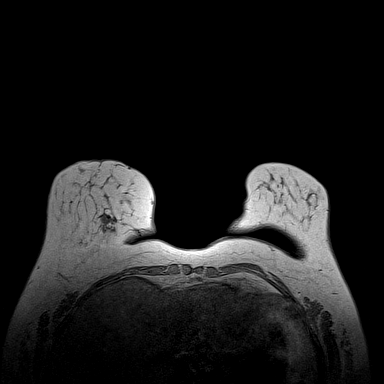
[im 72/144]
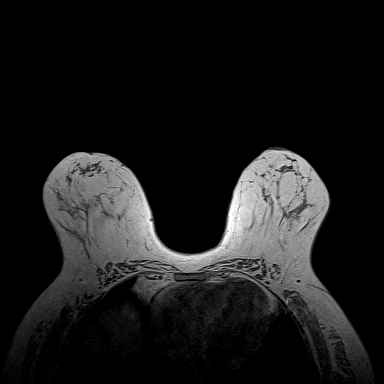
[im 108/144]
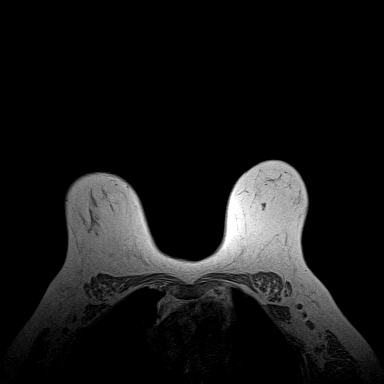
[im 144/144]
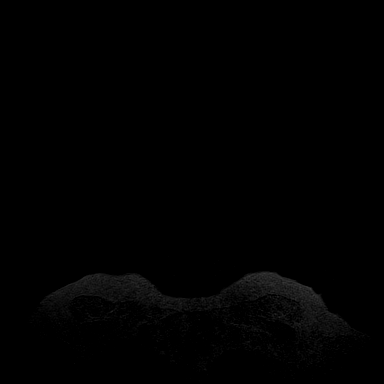

[Series 5: fl3d pre-cm · axial · non-contrast · 1.2mm · 0.94mm/px · z∈[-49,+122]mm · 5 of 144 slices shown]
[im 1/144]
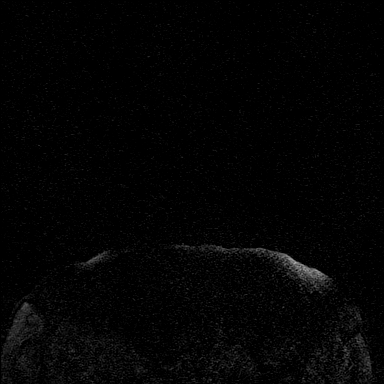
[im 36/144]
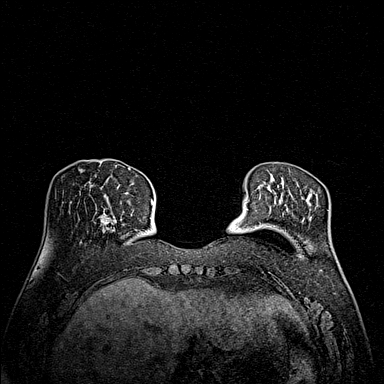
[im 72/144]
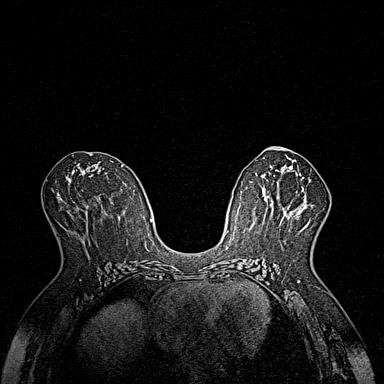
[im 108/144]
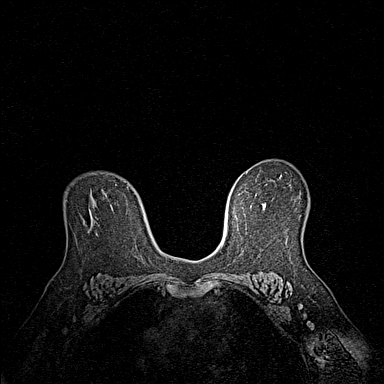
[im 144/144]
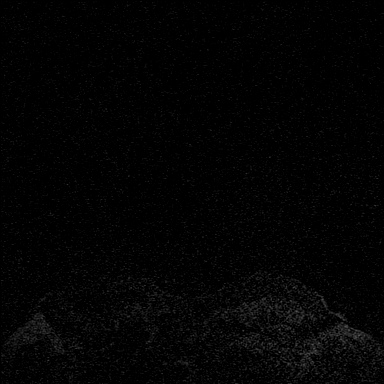

[Series 6: fl3d post-cm 20 · axial · 1.2mm · 0.94mm/px · z∈[-49,+122]mm · 5 of 144 slices shown (1 of 3)]
[im 1/144]
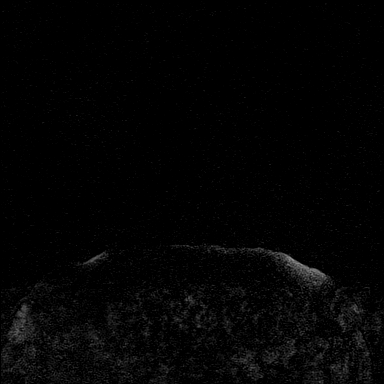
[im 36/144]
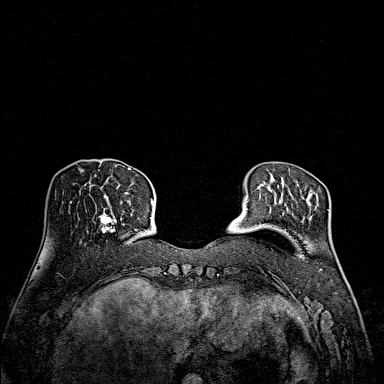
[im 72/144]
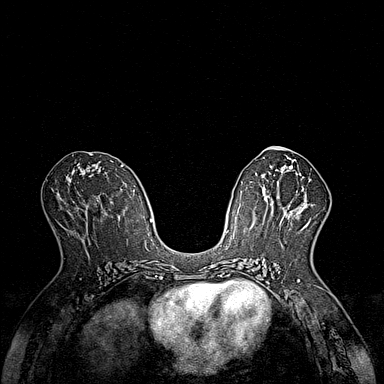
[im 108/144]
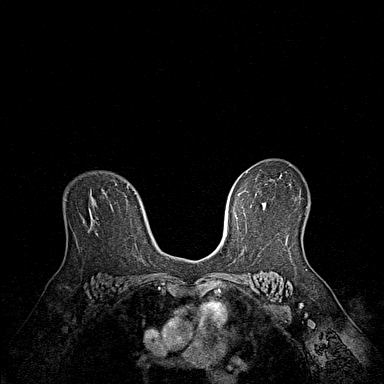
[im 144/144]
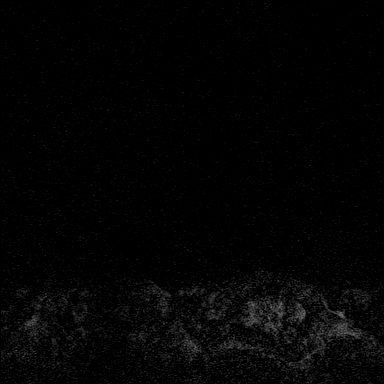

[Series 7: fl3d post-cm 20 · axial · 1.2mm · 0.94mm/px · z∈[-49,+122]mm · 5 of 144 slices shown (2 of 3)]
[im 1/144]
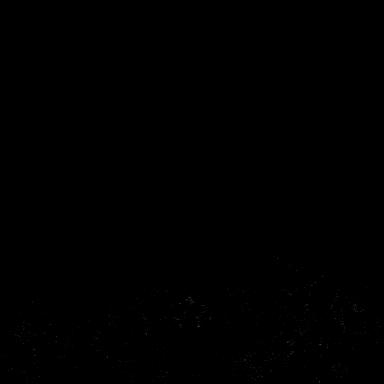
[im 36/144]
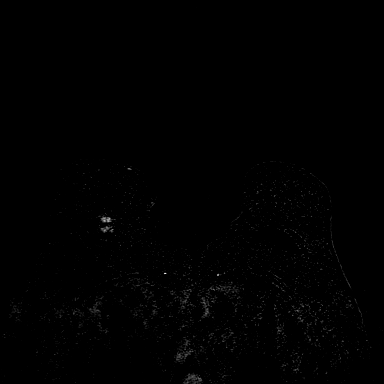
[im 72/144]
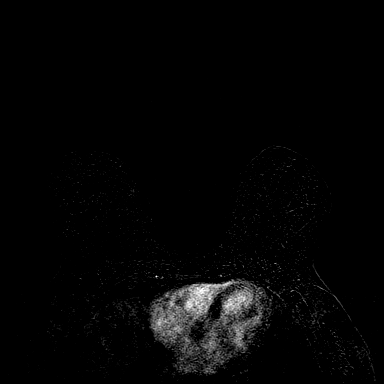
[im 108/144]
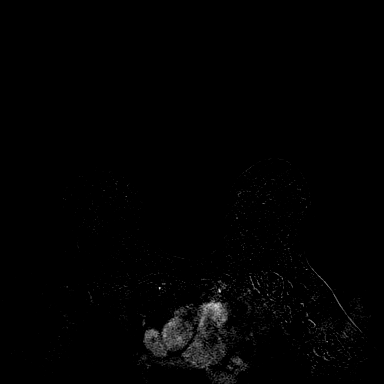
[im 144/144]
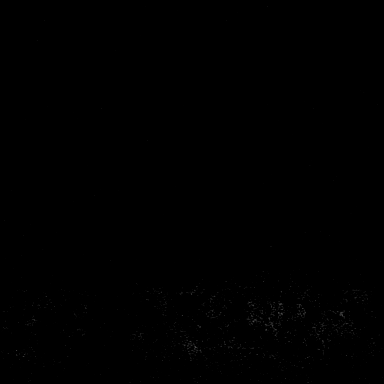

[Series 8: fl3d post-cm 20 · axial · 172.8mm · 0.94mm/px · 1 of 1 slices shown (3 of 3)]
[im 1/1]
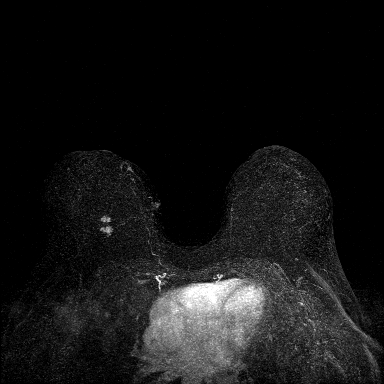

[Series 9: fl3d post-cm 3min · axial · 1.2mm · 0.94mm/px · z∈[-49,+122]mm · 5 of 144 slices shown]
[im 1/144]
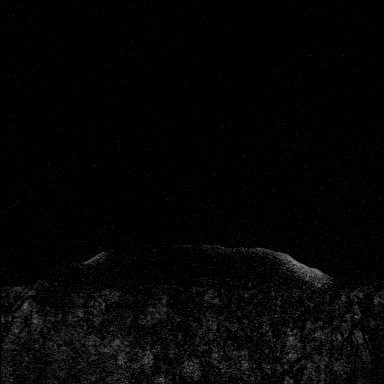
[im 36/144]
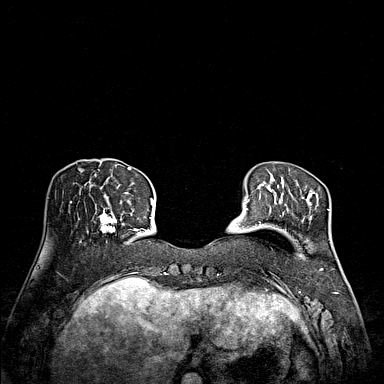
[im 72/144]
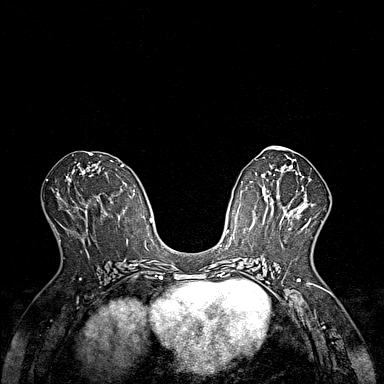
[im 108/144]
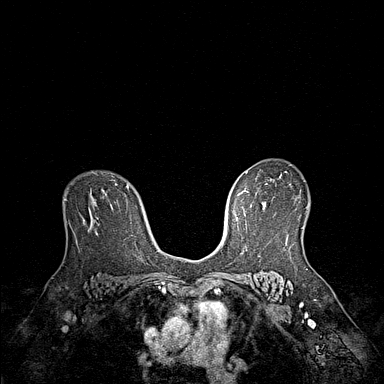
[im 144/144]
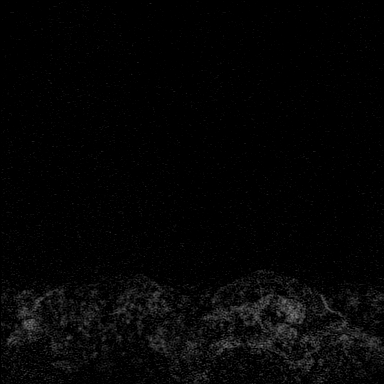

[Series 10: fl3d post-cm 3min_sub · axial · 1.2mm · 0.94mm/px · z∈[-49,+122]mm · 6 of 144 slices shown]
[im 1/144]
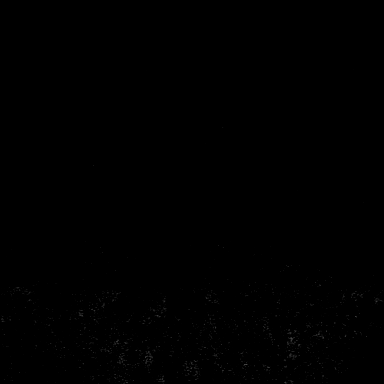
[im 29/144]
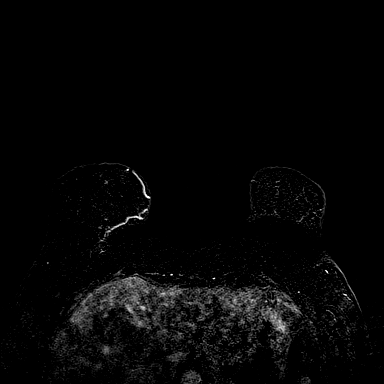
[im 58/144]
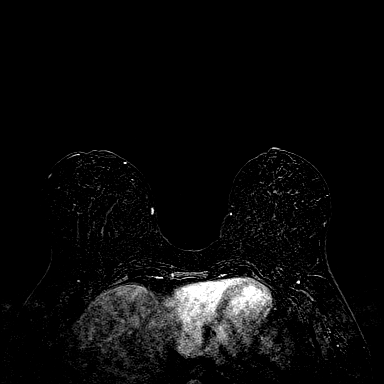
[im 86/144]
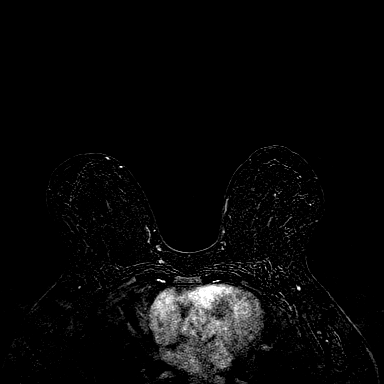
[im 115/144]
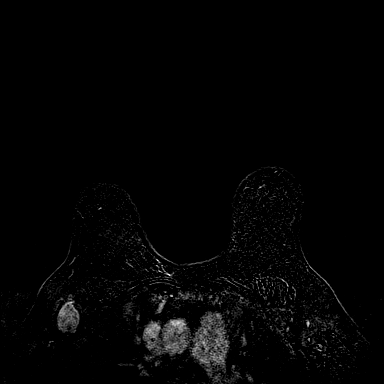
[im 144/144]
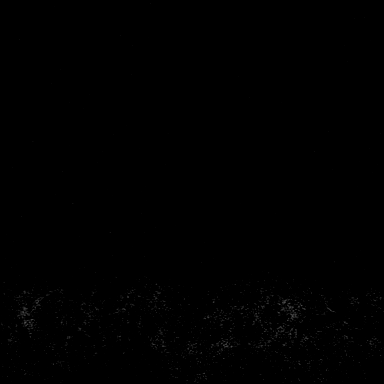

[Series 11: fl3d post-cm 3min_sub_mip_tra · axial · 172.8mm · 0.94mm/px · 1 of 1 slices shown]
[im 1/1]
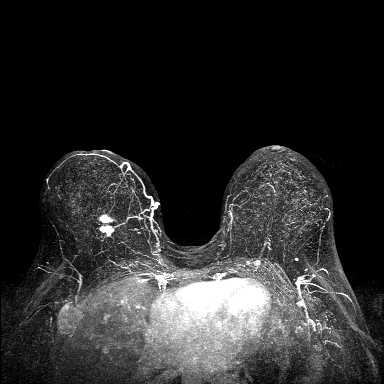

[33 of 48 positions shown; findings below may reference images not displayed]

Three-dimensional MR images were rendered by post-processing of the
original MR data on an independent workstation. The
three-dimensional MR images were interpreted, and findings are
reported in the following complete MRI report for this study. Three
dimensional images were evaluated at the independent interpreting
workstation using the DynaCAD thin client.
FINDINGS: Breast composition: b. Scattered fibroglandular tissue.

Background parenchymal enhancement: Minimal

Right breast: Two adjacent 2 cm masses are identified within the
LOWER RIGHT breast, middle to posterior depth with biopsy clip noted
within the anterior mass, compatible with biopsy-proven malignancy.
The 2 masses together measure 2.1 x 2.3 x 1.5 cm (transverse x AP x
CC)

No other suspicious mass or enhancement identified within the RIGHT
breast.

Left breast: No mass or abnormal enhancement.

Lymph nodes: A single abnormal enlarged RIGHT axillary lymph node is
noted containing biopsy clip artifact and compatible with metastasis
involving a RIGHT axillary lymph node.

No other abnormal lymph nodes are identified.

Ancillary findings:  None.
IMPRESSION: 1. Two adjacent LOWER RIGHT breast masses, spanning an area
measuring 2.1 x 2.3 x 1.5 cm, compatible with biopsy-proven
malignancy. No other suspicious RIGHT breast abnormalities noted.
2. Single enlarged RIGHT axillary lymph node compatible with
biopsy-proven metastasis.
3. No evidence of LEFT breast malignancy.

RECOMMENDATION:
Treatment plan

BI-RADS CATEGORY  6: Known biopsy-proven malignancy.

## 2021-01-30 MED ORDER — GADOBUTROL 1 MMOL/ML IV SOLN
6.0000 mL | Freq: Once | INTRAVENOUS | Status: AC | PRN
  Administered 2021-01-30: 6 mL via INTRAVENOUS

## 2021-01-31 ENCOUNTER — Inpatient Hospital Stay: Payer: 59 | Attending: Oncology

## 2021-01-31 ENCOUNTER — Encounter: Payer: Self-pay | Admitting: Oncology

## 2021-01-31 ENCOUNTER — Encounter: Payer: Self-pay | Admitting: *Deleted

## 2021-01-31 ENCOUNTER — Ambulatory Visit (HOSPITAL_COMMUNITY)
Admission: RE | Admit: 2021-01-31 | Discharge: 2021-01-31 | Disposition: A | Discharge status: Home / Self Care | Payer: 59 | Source: Ambulatory Visit | Attending: Oncology | Admitting: Oncology

## 2021-01-31 DIAGNOSIS — Z171 Estrogen receptor negative status [ER-]: Secondary | ICD-10-CM | POA: Insufficient documentation

## 2021-01-31 DIAGNOSIS — Z5111 Encounter for antineoplastic chemotherapy: Secondary | ICD-10-CM | POA: Insufficient documentation

## 2021-01-31 DIAGNOSIS — Z170 Estrogen receptor positive status [ER+]: Secondary | ICD-10-CM | POA: Diagnosis not present

## 2021-01-31 DIAGNOSIS — Z0189 Encounter for other specified special examinations: Secondary | ICD-10-CM | POA: Diagnosis not present

## 2021-01-31 DIAGNOSIS — E119 Type 2 diabetes mellitus without complications: Secondary | ICD-10-CM | POA: Diagnosis not present

## 2021-01-31 DIAGNOSIS — C50311 Malignant neoplasm of lower-inner quadrant of right female breast: Secondary | ICD-10-CM | POA: Insufficient documentation

## 2021-01-31 DIAGNOSIS — E785 Hyperlipidemia, unspecified: Secondary | ICD-10-CM | POA: Insufficient documentation

## 2021-01-31 DIAGNOSIS — I10 Essential (primary) hypertension: Secondary | ICD-10-CM | POA: Insufficient documentation

## 2021-01-31 DIAGNOSIS — Z5112 Encounter for antineoplastic immunotherapy: Secondary | ICD-10-CM | POA: Insufficient documentation

## 2021-01-31 DIAGNOSIS — Z79899 Other long term (current) drug therapy: Secondary | ICD-10-CM | POA: Insufficient documentation

## 2021-01-31 DIAGNOSIS — C773 Secondary and unspecified malignant neoplasm of axilla and upper limb lymph nodes: Secondary | ICD-10-CM | POA: Insufficient documentation

## 2021-01-31 LAB — ECHOCARDIOGRAM COMPLETE
Area-P 1/2: 3.72 cm2
S' Lateral: 2.5 cm

## 2021-01-31 NOTE — Progress Notes (Signed)
  Echocardiogram 2D Echocardiogram has been performed.  Bailynn Dyk G Earnesteen Birnie 01/31/2021, 10:03 AM

## 2021-01-31 NOTE — Progress Notes (Signed)
Met with patient and interpreter at registration to introduce myself as Arboriculturist and to offer available resources.  Discussed one-time $1000 Radio broadcast assistant to assist with personal expenses while going through treatment. She is interested in applying and will email her last paystub. She has not worked since March.  She has my card for any additional financial questions or concerns and I will meet with her again after chemo class.

## 2021-01-31 NOTE — Progress Notes (Signed)
Pharmacist Chemotherapy Monitoring - Initial Assessment    Anticipated start date: 02/07/21   Regimen:  . Are orders appropriate based on the patient's diagnosis, regimen, and cycle? Yes . Does the plan date match the patient's scheduled date? Yes . Is the sequencing of drugs appropriate? Yes . Are the premedications appropriate for the patient's regimen? Yes . Prior Authorization for treatment is: Approved o If applicable, is the correct biosimilar selected based on the patient's insurance? yes  Organ Function and Labs: Marland Kitchen Are dose adjustments needed based on the patient's renal function, hepatic function, or hematologic function? No . Are appropriate labs ordered prior to the start of patient's treatment? Yes . Other organ system assessment, if indicated: anthracyclines: Echo/ MUGA . The following baseline labs, if indicated, have been ordered: pembrolizumab: baseline TSH +/- T4  Dose Assessment: . Are the drug doses appropriate? Yes . Are the following correct: o Drug concentrations Yes o IV fluid compatible with drug Yes o Administration routes Yes o Timing of therapy Yes . If applicable, does the patient have documented access for treatment and/or plans for port-a-cath placement? yes . If applicable, have lifetime cumulative doses been properly documented and assessed? yes Lifetime Dose Tracking  No doses have been documented on this patient for the following tracked chemicals: Doxorubicin, Epirubicin, Idarubicin, Daunorubicin, Mitoxantrone, Bleomycin, Oxaliplatin, Carboplatin, Liposomal Doxorubicin  o   Toxicity Monitoring/Prevention: . The patient has the following take home antiemetics prescribed: Prochlorperazine, Dexamethasone and Lorazepam . The patient has the following take home medications prescribed: N/A . Medication allergies and previous infusion related reactions, if applicable, have been reviewed and addressed. Yes . The patient's current medication list has been  assessed for drug-drug interactions with their chemotherapy regimen. no significant drug-drug interactions were identified on review.  Order Review: . Are the treatment plan orders signed? Yes . Is the patient scheduled to see a provider prior to their treatment? Yes  I verify that I have reviewed each item in the above checklist and answered each question accordingly.   Kennith Center, Pharm.D., CPP 01/31/2021@2 :18 PM

## 2021-01-31 NOTE — Progress Notes (Signed)
Met with patient after class. She was unable to email information but can bring on 02/02/21. Also, gave her a [employer] application for her to complete should she need assistance with Keytruda. She will bring back on 02/02/21 as well.  She has my card for any additional financial questions or concerns.

## 2021-02-01 ENCOUNTER — Ambulatory Visit (HOSPITAL_COMMUNITY)
Admission: RE | Admit: 2021-02-01 | Discharge: 2021-02-01 | Disposition: A | Discharge status: Home / Self Care | Payer: 59 | Source: Ambulatory Visit | Attending: Oncology | Admitting: Oncology

## 2021-02-01 ENCOUNTER — Encounter: Payer: Self-pay | Admitting: Oncology

## 2021-02-01 ENCOUNTER — Encounter (HOSPITAL_COMMUNITY)
Admission: RE | Admit: 2021-02-01 | Discharge: 2021-02-01 | Disposition: A | Discharge status: Home / Self Care | Payer: 59 | Source: Ambulatory Visit | Attending: Oncology | Admitting: Oncology

## 2021-02-01 ENCOUNTER — Other Ambulatory Visit: Payer: Self-pay

## 2021-02-01 DIAGNOSIS — C50311 Malignant neoplasm of lower-inner quadrant of right female breast: Secondary | ICD-10-CM | POA: Diagnosis not present

## 2021-02-01 LAB — GLUCOSE, CAPILLARY: Glucose-Capillary: 123 mg/dL — ABNORMAL HIGH (ref 70–99)

## 2021-02-01 IMAGING — NM NM BONE WHOLE BODY
2 series · 2 of 2 positions shown · non-contrast
Comparison: CT chest [DATE]

CLINICAL DATA: Newly diagnosed breast cancer

EXAM:
NUCLEAR MEDICINE WHOLE BODY BONE SCAN
TECHNIQUE: Whole body anterior and posterior images were obtained approximately
3 hours after intravenous injection of radiopharmaceutical.
RADIOPHARMACEUTICALS:  21.4 mCi [UE] MDP IV

[Series 1: whole body · 2.66mm/px · 1 of 1 slices shown (1 of 2)]
[im 1/1]
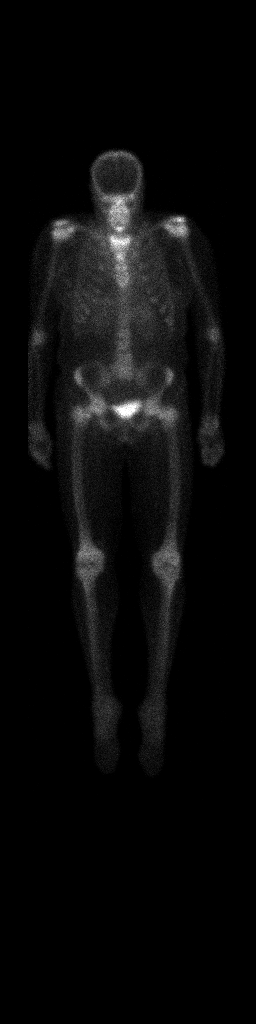

[Series 1: whole body · 2.66mm/px · 1 of 1 slices shown (2 of 2)]
[im 1/1]
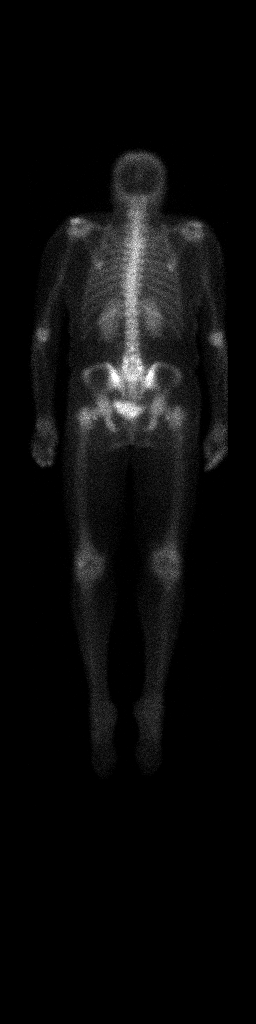

[2 of 2 positions shown; findings below may reference images not displayed]

FINDINGS: Physiologic distribution of radiotracer with renal uptake and
excretion. No focal abnormal radiotracer accumulation within the
axial or appendicular skeleton to suggest metastatic disease.
IMPRESSION: No scintigraphic evidence of osseous metastatic disease.

## 2021-02-01 MED ORDER — TECHNETIUM TC 99M MEDRONATE IV KIT
21.4000 | PACK | Freq: Once | INTRAVENOUS | Status: AC | PRN
  Administered 2021-02-01: 21.4 via INTRAVENOUS

## 2021-02-01 NOTE — Progress Notes (Signed)
Met with patient whom brought income for [employer].  Patient approved for one-time $1000 [employer] grant to assist with personal expenses while in treatment. Discussed in detail expenses and how they are covered. She has a copy of the approval letter and expense sheet along with the Outpatient pharmacy information. She received a gift card today from her grant.  Patient also returned her portion of [employer] application for [employer]. Obtained physician signature. Faxed completed application to [employer]. Fax received ok per confirmation sheet. They will process and advise of determination.  Patient has my card for any additional financial questions or concerns.

## 2021-02-01 NOTE — Progress Notes (Signed)
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 She was evaluated in the multidisciplinary breast cancer clinic on 01/18/2021.  She is accompanied by interpreter  Since consultation, she has port placed on 01/26/2021.  She has some discomfort in the right shoulder and neck associated with this.  She underwent staging chest CT on 01/27/2021 showing: moderately enlarged right axillary lymph node with internal biopsy clip; no additional sites of metastatic disease.  She also underwent breast MRI on 01/30/2021 showing: breast composition B; two adjacent lower right breast masses, spanning 2.3 cm, compatible with biopsy-proven malignancy; no other suspicious right breast abnormalities; single enlarged right axillary lymph node compatible with biopsy-proven metastasis; no evidence of left breast malignancy.  She also underwent echocardiogram on 01/31/2021 showing an ejection fraction of 60-65%.  Finally, she also underwent bone scan yesterday, 02/01/2021, showing: No evidence of metastatic disease  REVIEW OF SYSTEMS: Rebecca [name] wants to know the cause of her cancer.  She says she has had a great deal of drama in her life following her separation 5 years ago and wonders if that is the cause.  She was told that she  should eat no sugar because cancer eats sugar and that if she does not eat sugar the cancer will shrink.  We discussed that at length today.  She also had many questions regarding diet which we addressed.  In the middle of the visit she had an episode of apparent hypoglycemia and we gave her a soft drink which was helpful.  She only had a few sips.  A detailed review of systems today was otherwise stable.   COVID 19 VACCINATION STATUS: fully vaccinated [employer]), with booster 12/2020   HISTORY OF CURRENT ILLNESS: Rebecca [name] herself palpated a mass in the right axilla. She underwent bilateral diagnostic mammography with tomography and right breast ultrasonography at [facility] on 01/03/2021 showing: breast density category B; two adjacent suspicious right breast masses at 5 o'clock; markedly abnormal right axillary lymph node; no evidence of malignancy on left.  Accordingly on 01/06/2021 she proceeded to biopsy of the right breast area in question. The pathology from this procedure ([id number]) showed: invasive ductal carcinoma, grade 3. Prognostic indicators significant for: estrogen receptor, 10% positive with weak staining intensity and progesterone receptor, 0% negative. Proliferation marker Ki67 at 90%. HER2 equivocal by immunohistochemistry (2+), but negative by fluorescent in situ hybridization with a signals ratio 1.38 and number per cell 3.63.  Cancer Staging Malignant neoplasm of lower-inner quadrant of right breast of female, estrogen receptor positive (Rebecca [name]) Staging form: Breast, AJCC 8th Edition - Clinical: Stage IIIB (cT2, cN1, cM0, G3, ER-, PR-, HER2-) - Signed by Rebecca Cruel, MD on 01/18/2021 Stage prefix: Initial diagnosis Histologic grading system: 3 grade system  [name] is as detailed below.   PAST MEDICAL HISTORY: Past Medical History:  Diagnosis Date  . Arthritis   .  Breast cancer ([place])   . Diabetes mellitus   . Family history  of lymphoma 01/18/2021  . Family history of throat cancer 01/18/2021  . Hyperlipidemia   . Hypertension     PAST SURGICAL HISTORY: Past Surgical History:  Procedure Laterality Date  . CESAREAN SECTION    . PORTACATH PLACEMENT N/A 01/26/2021   Procedure: INSERTION PORT-A-CATH;  Surgeon: Rebecca Luna, MD;  Location: [facility] OR;  Service: General;  Laterality: N/A;  14    FAMILY HISTORY: Family History  Problem Relation Age of Onset  . Diabetes Mother   . Heart disease Mother   . Diabetes Father   . Throat cancer Father        d. 50s  . Diabetes Sister   . Diabetes Brother   . Lymphoma Cousin        maternal cousin; d. 71s   Her father died in his 60's and her mother in her 53'I, both from complications of diabetes. [name] has 6 brothers and 1 sister. There is no family history of breast or ovarian cancer to her knowledge.   GYNECOLOGIC HISTORY:  No LMP recorded. Menarche: 55 years old [name] 0 LMP 10/31/2020 (before that, 06/2020) Contraceptive used from 2017-2018 HRT n/a  Hysterectomy? no BSO? no   SOCIAL HISTORY: (updated 12/2020)  [name] is originally from [place]. 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 She is divorced. She lives at home with her nephew [name], who is 72 and works for [employer]. She attends [employer] ([employer]).    ADVANCED DIRECTIVES: not in place; at the 01/18/2021 visit she stated her ex-husband [name], who is disabled due to past injuries ([phone]) , would be the person to contact if she became incapacitated [the patient states aside from her nephew she has no other relatives in [place]: Social History   Tobacco Use  . Smoking status: Passive Smoke Exposure - Never Smoker  . Smokeless tobacco: Never Used  Substance Use Topics  . Alcohol use: No  . Drug use: No     Colonoscopy: never done, referral in place  PAP: 12/2020,  negative  Bone density: never done   Allergies  Allergen Reactions  . Lisinopril Swelling    Swelling in throat, coughing, difficulty swallowing, could not sleep. Occurred Dec 2020.    Current Outpatient Medications  Medication Sig Dispense Refill  . amLODipine (NORVASC) 10 MG tablet Take 1 tablet (10 mg total) by mouth daily. 90 tablet 1  . Ascorbic Acid (VITAMIN C) 1000 MG tablet Take 1,000 mg by mouth daily.    Marland Kitchen atorvastatin (LIPITOR) 80 MG tablet TAKE 1 TABLET (80 MG TOTAL) BY MOUTH DAILY. 90 tablet 1  . Blood Glucose Monitoring Suppl (ONETOUCH VERIO REFLECT) w/Device KIT 1 kit by Does not apply route in the morning, at noon, and at bedtime. Use as directed to test blood sugar up to three times daily.E11.9 1 kit 0  . cetirizine (ZYRTEC) 10 MG tablet Take 1 tablet (10 mg total) by mouth daily. (Patient not taking: No sig reported) 30 tablet 0  . cloNIDine (CATAPRES) 0.1 MG tablet Take 1 tablet (0.1 mg total) by mouth at bedtime as needed. (Patient not taking: No sig reported) 90 tablet 1  . clotrimazole (CLOTRIMAZOLE AF) 1 % cream Apply 1 application topically 2 (two) times daily. 30 g 1  . cyclobenzaprine (FLEXERIL) 10 MG tablet Take 10 mg by mouth 3 (  three) times daily as needed for muscle spasms.    . dapagliflozin propanediol (FARXIGA) 10 MG TABS tablet Take 1 tablet (10 mg total) by mouth daily before breakfast. 90 tablet 1  . dexamethasone (DECADRON) 4 MG tablet TAKE 2 TABLETS ONCE A DAY FOR 3 DAYS AFTER CARBOPLATIN AND AC CHEMOTHERAPY. TAKE WITH FOOD. 30 tablet 1  . ergocalciferol (DRISDOL) 1.25 MG (50000 UT) capsule Take 1 capsule (50,000 Units total) by mouth once a week. 12 capsule 0  . glipiZIDE (GLUCOTROL) 5 MG tablet TAKE 1 TABLET BY MOUTH DAILY (Patient taking differently: Take 5 mg by mouth daily before breakfast.) 90 tablet 1  . glucose blood (ONETOUCH VERIO) test strip Use as directed to test blood sugar up to three times daily. E11.9 100 each 6  . Lancets (ONETOUCH  DELICA PLUS [id number]) MISC 1 Device by Does not apply route in the morning, at noon, and at bedtime. Use as directed to test blood sugar up to three times daily. E11.9 100 each 6  . lidocaine-prilocaine (EMLA) cream APPLY TO AFFECTED AREA AS DIRECTED 30 g 3  . LORazepam (ATIVAN) 0.5 MG tablet Take 1 tablet (0.5 mg total) by mouth at bedtime. 30 tablet 0  . LORazepam (ATIVAN) 0.5 MG tablet Take 1 tablet by mouth at bedtime. 30 tablet 0  . metFORMIN (GLUCOPHAGE) 500 MG tablet TAKE 1 TABLET BY MOUTH TWICE DAILY (Patient taking differently: Take 500 mg by mouth 2 (two) times daily with a meal.) 180 tablet 0  . naproxen (NAPROSYN) 500 MG tablet Take 1 tablet (500 mg total) by mouth 2 (two) times daily with a meal. (Patient not taking: No sig reported) 30 tablet 1  . Omega-3 1000 MG CAPS Take 1,000 mg by mouth daily.    Marland Kitchen omeprazole (PRILOSEC) 20 MG capsule Take 1 capsule (20 mg total) by mouth daily. (Patient not taking: No sig reported) 30 capsule 0  . oxyCODONE (OXY IR/ROXICODONE) 5 MG immediate release tablet Take 1 tablet (5 mg total) by mouth every 6 (six) hours as needed for severe pain. 15 tablet 0  . polyethylene glycol (MIRALAX / GLYCOLAX) 17 g packet TAKE 17 G BY MOUTH DAILY. (Patient not taking: No sig reported) 14 each 0  . prochlorperazine (COMPAZINE) 10 MG tablet TAKE 1 TABLET BY MOUTH EVERY 6 HOURS AS NEEDED (NAUSEA OR VOMITING). 30 tablet 1  . vitamin B-12 (CYANOCOBALAMIN) 100 MCG tablet Take 100 mcg by mouth daily.     No current facility-administered medications for this visit.    OBJECTIVE: Rebecca [name] woman in no acute distress  Vitals:   02/02/21 1646  BP: 129/79  Pulse: 84  Resp: 17  Temp: (!) 97.5 F (36.4 C)  SpO2: 99%     Body mass index is 24.11 kg/m.   Wt Readings from Last 3 Encounters:  02/02/21 136 lb 1.6 oz (61.7 kg)  01/26/21 139 lb 8.8 oz (63.3 kg)  01/18/21 139 lb 9.6 oz (63.3 kg)      ECOG FS:1 - Symptomatic but completely ambulatory  Sclerae unicteric,  EOMs intact Wearing a mask No cervical or supraclavicular adenopathy Lungs no rales or rhonchi Heart regular rate and rhythm Abd soft, nontender, positive bowel sounds MSK no focal spinal tenderness, no upper extremity lymphedema Neuro: nonfocal, well oriented, appropriate affect Breasts: I had more difficulty today palpating the mass in the right axilla.  I did not palpate a mass in the right breast.  The left breast and left axilla are benign.   LAB  RESULTS:  CMP     Component Value Date/Time   NA 138 01/26/2021 0824   NA 140 12/27/2020 1123   K 4.1 01/26/2021 0824   CL 104 01/26/2021 0824   CO2 24 01/26/2021 0824   GLUCOSE 118 (H) 01/26/2021 0824   BUN 17 01/26/2021 0824   BUN 11 12/27/2020 1123   CREATININE 0.60 01/26/2021 0824   CREATININE 0.67 01/18/2021 0810   CREATININE 0.50 11/02/2016 1102   CALCIUM 9.4 01/26/2021 0824   PROT 7.2 01/26/2021 0824   PROT 7.5 12/27/2020 1123   ALBUMIN 4.1 01/26/2021 0824   ALBUMIN 4.7 12/27/2020 1123   AST 23 01/26/2021 0824   AST 24 01/18/2021 0810   ALT 15 01/26/2021 0824   ALT 20 01/18/2021 0810   ALKPHOS 48 01/26/2021 0824   BILITOT 0.5 01/26/2021 0824   BILITOT 0.4 01/18/2021 0810   GFRNONAA >60 01/26/2021 0824   GFRNONAA >60 01/18/2021 0810   GFRNONAA >89 01/16/2016 1006   GFRAA 118 06/02/2020 0942   GFRAA >89 01/16/2016 1006    No results found for: Ronnald Ramp, A1GS, A2GS, BETS, BETA2SER, GAMS, MSPIKE, SPEI  Lab Results  Component Value Date   WBC 6.7 01/26/2021   NEUTROABS 2.2 01/26/2021   HGB 13.3 01/26/2021   HCT 41.6 01/26/2021   MCV 83.0 01/26/2021   PLT 319 01/26/2021    No results found for: LABCA2  No components found for: [id number]  No results for input(s): INR in the last 168 hours.  No results found for: LABCA2  No results found for: TGP498  No results found for: YME158  No results found for: XEN407  No results found for: CA2729  No components found for: HGQUANT  No  results found for: CEA1 / No results found for: CEA1   No results found for: AFPTUMOR  No results found for: CHROMOGRNA  No results found for: KPAFRELGTCHN, LAMBDASER, KAPLAMBRATIO (kappa/lambda light chains)  No results found for: HGBA, HGBA2QUANT, HGBFQUANT, HGBSQUAN (Hemoglobinopathy evaluation)   No results found for: LDH  No results found for: IRON, TIBC, IRONPCTSAT (Iron and TIBC)  No results found for: FERRITIN  Urinalysis    Component Value Date/Time   COLORURINE YELLOW 06/24/2007 0116   APPEARANCEUR CLOUDY (A) 06/24/2007 0116   LABSPEC 1.025 09/18/2020 1125   PHURINE 5.5 09/18/2020 1125   GLUCOSEU 100 (A) 09/18/2020 1125   HGBUR TRACE (A) 09/18/2020 1125   BILIRUBINUR NEGATIVE 09/18/2020 1125   KETONESUR NEGATIVE 09/18/2020 1125   PROTEINUR NEGATIVE 09/18/2020 1125   UROBILINOGEN 0.2 09/18/2020 1125   NITRITE NEGATIVE 09/18/2020 1125   LEUKOCYTESUR NEGATIVE 09/18/2020 1125     STUDIES: CT Chest W Contrast  Result Date: 01/29/2021 CLINICAL DATA:  New diagnosis of right breast cancer. Staging evaluation. EXAM: CT CHEST WITH CONTRAST TECHNIQUE: Multidetector CT imaging of the chest was performed during intravenous contrast administration. CONTRAST:  73m OMNIPAQUE IOHEXOL 300 MG/ML  SOLN COMPARISON:  Chest radiograph from one day prior. FINDINGS: Cardiovascular: Normal heart size. No significant pericardial effusion/thickening. Right internal jugular Port-A-Cath terminates in the lower third of the SVC. Expected mild subcutaneous emphysema surrounding the port in the supraclavicular right neck compatible with recent port placement. Normal course and caliber of the thoracic aorta. Dilated main pulmonary artery (3.5 cm diameter). No central pulmonary emboli. Mediastinum/Nodes: No discrete thyroid nodules. Unremarkable esophagus. Enlarged 2.4 cm short axis diameter right axillary node with internal biopsy clip (series 2/image 43). No additional pathologically enlarged  axillary nodes. No pathologically enlarged mediastinal or hilar  nodes. Lungs/Pleura: No pneumothorax. No pleural effusion. No acute consolidative airspace disease, lung masses or significant pulmonary nodules. Mild patchy subpleural ground-glass opacity and reticulation at both lung apices, right greater than left. Mild hypoventilatory changes in the dependent lower lobes. Upper abdomen: No acute abnormality. Musculoskeletal:  No aggressive appearing focal osseous lesions. IMPRESSION: 1. Moderately enlarged right axillary lymph node with internal biopsy clip, compatible with right axillary nodal metastasis. 2. No additional sites of metastatic disease in the chest. 3. Mild patchy subpleural ground-glass opacity and reticulation at both lung apices, right greater than left, nonspecific, favor postinfectious scarring. 4. Dilated main pulmonary artery, suggesting pulmonary arterial hypertension. Electronically Signed   By: Ilona Sorrel M.D.   On: 01/29/2021 19:51   NM Bone Scan Whole Body  Result Date: 02/02/2021 CLINICAL DATA:  Newly diagnosed breast cancer EXAM: NUCLEAR MEDICINE WHOLE BODY BONE SCAN TECHNIQUE: Whole body anterior and posterior images were obtained approximately 3 hours after intravenous injection of radiopharmaceutical. RADIOPHARMACEUTICALS:  21.4 mCi Technetium-27mMDP IV COMPARISON:  CT chest 01/27/2021 FINDINGS: Physiologic distribution of radiotracer with renal uptake and excretion. No focal abnormal radiotracer accumulation within the axial or appendicular skeleton to suggest metastatic disease. IMPRESSION: No scintigraphic evidence of osseous metastatic disease. Electronically Signed   By: NDavina PokeD.O.   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 LABS:  Not performed EXAM: BILATERAL BREAST MRI  WITH AND WITHOUT CONTRAST TECHNIQUE: Multiplanar, multisequence MR images of both breasts were obtained prior to and following the intravenous administration of 6 ml of Gadavist Three-dimensional MR images were rendered by post-processing of the original MR data on an independent workstation. The three-dimensional MR images were interpreted, and findings are reported in the following complete MRI report for this study. Three dimensional images were evaluated at the independent interpreting workstation using the DynaCAD thin client. COMPARISON:  Prior mammograms and ultrasounds FINDINGS: Breast composition: b. Scattered fibroglandular tissue. Background parenchymal enhancement: Minimal Right breast: Two adjacent 2 cm masses are identified within the LOWER RIGHT breast, middle to posterior depth with biopsy clip noted within the anterior mass, compatible with biopsy-proven malignancy. The 2 masses together measure 2.1 x 2.3 x 1.5 cm (transverse x AP x CC) No other suspicious mass or enhancement identified within the RIGHT breast. Left breast: No mass or abnormal enhancement. Lymph nodes: A single abnormal enlarged RIGHT axillary lymph node is noted containing biopsy clip artifact and compatible with metastasis involving a RIGHT axillary lymph node. No other abnormal lymph nodes are identified. Ancillary findings:  None. IMPRESSION: 1. Two adjacent LOWER RIGHT breast masses, spanning an area measuring 2.1 x 2.3 x 1.5 cm, compatible with biopsy-proven malignancy. No other suspicious RIGHT breast abnormalities noted. 2. Single enlarged RIGHT axillary lymph node compatible with biopsy-proven metastasis. 3. No evidence of LEFT breast malignancy. RECOMMENDATION: Treatment plan BI-RADS CATEGORY  6: Known biopsy-proven malignancy. Electronically Signed   By: JMargarette CanadaM.D.   On: 01/30/2021 12:08   DG Chest Port 1 View  Result Date: 01/26/2021 CLINICAL DATA:  Status post Port-A-Cath insertion. EXAM: PORTABLE CHEST 1 VIEW  COMPARISON:  11/21/2019. FINDINGS: Right IJ approach Port-A-Cath with the tip projecting at the superior right cavoatrial junction. No visible kink in the catheter. No visible pneumothorax or pleural effusions. No consolidation. Cardiomediastinal silhouette is within normal limits and similar to prior.  IMPRESSION: 1. Right IJ approach Port-A-Cath with the tip projecting at the superior right cavoatrial junction. 2. No acute cardiopulmonary disease. No visible pneumothorax. Electronically Signed   By: Margaretha Sheffield MD   On: 01/26/2021 13:41   DG Fluoro Guide CV Line-No Report  Result Date: 01/26/2021 Fluoroscopy was utilized by the requesting physician.  No radiographic interpretation.   ECHOCARDIOGRAM COMPLETE  Result Date: 01/31/2021    ECHOCARDIOGRAM REPORT   Patient Name:   BELVA KOZIEL Date of Exam: 01/31/2021 Medical Rec #:  [id number]            Height:       63.0 in Accession #:    [id number]           Weight:       139.6 lb Date of Birth:  Feb 26, 1966            BSA:          1.659 m Patient Age:    89 years             BP:           115/75 mmHg Patient Gender: F                    HR:           79 bpm. Exam Location:  Outpatient Procedure: 2D Echo, 3D Echo, Cardiac Doppler, Color Doppler and Strain Analysis Indications:    Z51.11 Encounter for antineoplastic chemotheraphy  History:        Patient has no prior history of Echocardiogram examinations.                 Risk Factors:Hypertension, Diabetes and Dyslipidemia. Breast                 Cancer.  Sonographer:    Jonelle Sidle Dance Referring Phys: [place]  1. Left ventricular ejection fraction, by estimation, is 60 to 65%. Left ventricular ejection fraction by 3D volume is 60 %. The left ventricle has normal function. The left ventricle has no regional wall motion abnormalities. There is mild asymmetric left ventricular hypertrophy of the basal-septal segment. Left ventricular diastolic parameters are consistent with  Grade I diastolic dysfunction (impaired relaxation).  2. Right ventricular systolic function is normal. The right ventricular size is normal. There is normal pulmonary artery systolic pressure.  3. The mitral valve is normal in structure. Trivial mitral valve regurgitation.  4. The aortic valve is tricuspid. Aortic valve regurgitation is not visualized. No aortic stenosis is present.  5. The inferior vena cava is normal in size with greater than 50% respiratory variability, suggesting right atrial pressure of 3 mmHg. Comparison(s): No prior Echocardiogram. FINDINGS  Left Ventricle: Left ventricular ejection fraction, by estimation, is 60 to 65%. Left ventricular ejection fraction by 3D volume is 60 %. The left ventricle has normal function. The left ventricle has no regional wall motion abnormalities. Global longitudinal strain performed but not reported based on interpreter judgement due to suboptimal tracking. The left ventricular internal cavity size was normal in size. There is mild asymmetric left ventricular hypertrophy of the basal-septal segment. Left ventricular diastolic parameters are consistent with Grade I diastolic dysfunction (impaired relaxation). Right Ventricle: The right ventricular size is normal. No increase in right ventricular wall thickness. Right ventricular systolic function is normal. There is normal pulmonary artery systolic pressure. The tricuspid regurgitant velocity is 1.95 m/s, and  with an assumed right atrial pressure of  3 mmHg, the estimated right ventricular systolic pressure is 68.3 mmHg. Left Atrium: Left atrial size was normal in size. Right Atrium: Right atrial size was normal in size. Pericardium: There is no evidence of pericardial effusion. Mitral Valve: The mitral valve is normal in structure. There is mild thickening of the mitral valve leaflet(s). Trivial mitral valve regurgitation. Tricuspid Valve: The tricuspid valve is normal in structure. Tricuspid valve  regurgitation is trivial. Aortic Valve: The aortic valve is tricuspid. Aortic valve regurgitation is not visualized. No aortic stenosis is present. Pulmonic Valve: The pulmonic valve was normal in structure. Pulmonic valve regurgitation is not visualized. Aorta: The aortic root and ascending aorta are structurally normal, with no evidence of dilitation. Venous: The inferior vena cava is normal in size with greater than 50% respiratory variability, suggesting right atrial pressure of 3 mmHg. IAS/Shunts: The interatrial septum is aneurysmal. No atrial level shunt detected by color flow Doppler.  LEFT VENTRICLE PLAX 2D LVIDd:         3.90 cm         Diastology LVIDs:         2.50 cm         LV e' medial:    5.98 cm/s LV PW:         1.10 cm         LV E/e' medial:  12.4 LV IVS:        0.90 cm         LV e' lateral:   8.49 cm/s LVOT diam:     1.80 cm         LV E/e' lateral: 8.8 LV SV:         44 LV SV Index:   26 LVOT Area:     2.54 cm        3D Volume EF                                LV 3D EF:    Left                                             ventricular                                             ejection                                             fraction by                                             3D volume                                             is 60 %.  3D Volume EF:                                3D EF:        60 %                                LV EDV:       95 ml                                LV ESV:       38 ml                                LV SV:        57 ml RIGHT VENTRICLE             IVC RV Basal diam:  2.70 cm     IVC diam: 1.30 cm RV S prime:     11.30 cm/s TAPSE (M-mode): 1.5 cm LEFT ATRIUM             Index       RIGHT ATRIUM          Index LA diam:        3.60 cm 2.17 cm/m  RA Area:     9.89 cm LA Vol (A2C):   33.0 ml 19.89 ml/m RA Volume:   18.00 ml 10.85 ml/m LA Vol (A4C):   25.0 ml 15.07 ml/m LA Biplane Vol: 29.1 ml 17.54 ml/m  AORTIC VALVE LVOT  Vmax:   85.50 cm/s LVOT Vmean:  62.000 cm/s LVOT VTI:    0.171 m  AORTA Ao Root diam: 3.00 cm Ao Asc diam:  2.50 cm MITRAL VALVE               TRICUSPID VALVE MV Area (PHT): 3.72 cm    TR Peak grad:   15.2 mmHg MV Decel Time: 204 msec    TR Vmax:        195.00 cm/s MV E velocity: 74.40 cm/s MV A velocity: 99.40 cm/s  SHUNTS MV E/A ratio:  0.75        Systemic VTI:  0.17 m                            Systemic Diam: 1.80 cm Gwyndolyn Kaufman MD Electronically signed by Gwyndolyn Kaufman MD Signature Date/Time: 01/31/2021/10:27:15 AM    Final    [place] AXILLARY NODE CORE BIOPSY RIGHT  Addendum Date: 01/09/2021   ADDENDUM REPORT: 01/09/2021 14:15 ADDENDUM: Pathology revealed GRADE III INVASIVE DUCTAL CARCINOMA of the Right breast, 5 o'clock, 6cmfn. This was found to be concordant by Dr. Marin Olp. Pathology revealed METASTATIC CARCINOMA INVOLVING A LYMPH NODE of the Right axilla. This was found to be concordant by Dr. Marin Olp. Pathology results were discussed with the patient by telephone. The patient reported doing well after the biopsies with tenderness at the sites. Post biopsy instructions and care were reviewed and questions were answered. The patient was encouraged to call [facility] for any additional concerns. My direct phone number was provided. The patient was referred to [facility] on January 18, 2021. Pathology results reported by Terie Purser, RN on 01/09/2021. Electronically Signed   By: Marin Olp M.D.   On: 01/09/2021 14:15   Result Date: 01/09/2021 CLINICAL DATA:  Patient presents for ultrasound-guided core needle biopsy 2 adjacent suspicious masses at the 5 o'clock position of the right breast 6 cm from the nipple measuring 2.2 cm together as well as biopsy of an abnormal right axillary lymph node. EXAM: ULTRASOUND GUIDED RIGHT BREAST CORE NEEDLE BIOPSY COMPARISON:  Previous exam(s).  PROCEDURE: I met with the patient and we discussed the procedure of ultrasound-guided biopsy, including benefits and alternatives. We discussed the high likelihood of a successful procedure. We discussed the risks of the procedure, including infection, bleeding, tissue injury, clip migration, and inadequate sampling. Informed written consent was given. The usual time-out protocol was performed immediately prior to the procedure. 1.  Lesion quadrant: Right inner lower quadrant. Using sterile technique and 1% Lidocaine as local anesthetic, under direct ultrasound visualization, a 12 gauge spring-loaded device was used to perform biopsy of the targeted suspicious mass over the 5 o'clock position of the right breast using a medial to lateral approach. At the conclusion of the procedure ribbon shaped tissue marker clip was deployed into the biopsy cavity. Follow up 2 view mammogram was performed and dictated separately. 2. Lesion quadrant: Right axilla. Using sterile technique and 1% Lidocaine as local anesthetic, under direct ultrasound visualization, a 14 gauge spring-loaded device was used to perform biopsy of the abnormal right axillary lymph node using a lateral to medial approach. At the conclusion of the procedure tri bell tissue marker clip was deployed into the biopsy cavity. Follow up 2 view mammogram was performed and dictated separately. IMPRESSION: Ultrasound guided biopsy of a suspicious right breast mass at the 5 o'clock position as well as abnormal right axillary lymph node. No apparent complications. Electronically Signed: By: Marin Olp M.D. On: 01/06/2021 08:31   MM CLIP PLACEMENT RIGHT  Result Date: 01/06/2021 CLINICAL DATA:  Patient is post ultrasound-guided core needle biopsy of a suspicious mass over the 3 o'clock position of the right breast as well as abnormal deep right axillary lymph node EXAM: DIAGNOSTIC RIGHT MAMMOGRAM POST ULTRASOUND BIOPSY COMPARISON:  Previous exam(s). FINDINGS:  Mammographic images were obtained following ultrasound guided biopsy of the targeted mass at the 5 o'clock position of the right breast as well as abnormal right axillary lymph node. The biopsy marking clip is in expected position at the site of biopsy in the posterior lower central right breast. Biopsy clip over the axilla is too deep to be included in the field of view. IMPRESSION: Appropriate positioning of the ribbon shaped biopsy marking clip at the site of biopsy in the lower central right breast. Nonvisualization of the post biopsy clip over the right axilla. Final Assessment: Post Procedure Mammograms for Marker Placement Electronically Signed   By: Marin Olp M.D.   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 This was found to be concordant by Dr. Marin Olp. Pathology revealed METASTATIC CARCINOMA INVOLVING A LYMPH NODE of the Right axilla. This was found to be concordant by Dr. Marin Olp. Pathology results were discussed with the patient by telephone. The patient reported doing well after the biopsies with tenderness at the sites. Post  biopsy instructions and care were reviewed and questions were answered. The patient was encouraged to call [facility] for any additional concerns. My direct phone number was provided. The patient was referred to [facility] on January 18, 2021. Pathology results reported by Terie Purser, RN on 01/09/2021. Electronically Signed   By: Marin Olp M.D.   On: 01/09/2021 14:15   Result Date: 01/09/2021 CLINICAL DATA:  Patient presents for ultrasound-guided core needle biopsy 2 adjacent suspicious masses at the 5 o'clock position of the right breast 6 cm from the  nipple measuring 2.2 cm together as well as biopsy of an abnormal right axillary lymph node. EXAM: ULTRASOUND GUIDED RIGHT BREAST CORE NEEDLE BIOPSY COMPARISON:  Previous exam(s). PROCEDURE: I met with the patient and we discussed the procedure of ultrasound-guided biopsy, including benefits and alternatives. We discussed the high likelihood of a successful procedure. We discussed the risks of the procedure, including infection, bleeding, tissue injury, clip migration, and inadequate sampling. Informed written consent was given. The usual time-out protocol was performed immediately prior to the procedure. 1.  Lesion quadrant: Right inner lower quadrant. Using sterile technique and 1% Lidocaine as local anesthetic, under direct ultrasound visualization, a 12 gauge spring-loaded device was used to perform biopsy of the targeted suspicious mass over the 5 o'clock position of the right breast using a medial to lateral approach. At the conclusion of the procedure ribbon shaped tissue marker clip was deployed into the biopsy cavity. Follow up 2 view mammogram was performed and dictated separately. 2. Lesion quadrant: Right axilla. Using sterile technique and 1% Lidocaine as local anesthetic, under direct ultrasound visualization, a 14 gauge spring-loaded device was used to perform biopsy of the abnormal right axillary lymph node using a lateral to medial approach. At the conclusion of the procedure tri bell tissue marker clip was deployed into the biopsy cavity. Follow up 2 view mammogram was performed and dictated separately. IMPRESSION: Ultrasound guided biopsy of a suspicious right breast mass at the 5 o'clock position as well as abnormal right axillary lymph node. No apparent complications. Electronically Signed: By: Marin Olp M.D. On: 01/06/2021 08:31     ELIGIBLE FOR AVAILABLE RESEARCH PROTOCOL: no  ASSESSMENT: 55 y.o. 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  (2) definitive surgery to follow  (3) adjuvant radiation  (4) genetics testing   PLAN: MM did meet with our chemotherapy teaching nurse and did remember some of the things she was taught.  However she had not yet bought her supportive medications and did not know when or how to take them.  We spent well over an hour today going over this material which I gave her in writing in [place] and which she noted in Arabic with the interpreter's help.  I am hopeful this will make it possible for her to undergo treatment without major complications but I remain concerned that despite our and her best efforts there may be problems.  I asked her to be sure to call us directly with any issues or questions and I gave her my nurses number as well as the front number to call so that she has someone she can call during the day or night.  I gave her a copy of her pathology as well as her CT scan and bone scan results and interpreted those for her.  I showed her the images of the MRI and she took a photo for her own records.  She understands that she will be here 02/07/2021 for chemo and then will take the medicines as prescribed the next 3days.  She will then return on 4/26 for Taxol only and we will make sure she is seen that day to review possible complications from the first day  treatment and to make sure all her treatments and visits are in place.  Total encounter time 55 minutes.Sarajane Jews C. Cerena Baine, MD 02/02/2021 6:16 PM Medical Oncology and Hematology [facility] [place], [place] [address] Tel. [phone]    Fax. [phone]   This document serves as a record of services personally performed by Lurline Del, MD. It was created on his behalf by Wilburn Mylar, a trained medical scribe. The creation of this record is based on the scribe's personal observations and the provider's statements to them.   I, Lurline Del MD, have reviewed the above documentation for accuracy and completeness, and I agree with the above.   *Total Encounter Time as defined by the Centers for Medicare and Medicaid Services includes, in addition to the face-to-face time of a patient visit (documented in the note above) non-face-to-face time: obtaining and reviewing outside history, ordering and reviewing medications, tests or procedures, care coordination (communications with other health care professionals or caregivers) and documentation in the medical record.

## 2021-02-02 ENCOUNTER — Encounter: Payer: Self-pay | Admitting: Genetic Counselor

## 2021-02-02 ENCOUNTER — Ambulatory Visit: Payer: Self-pay | Admitting: Genetic Counselor

## 2021-02-02 ENCOUNTER — Telehealth: Payer: Self-pay | Admitting: Genetic Counselor

## 2021-02-02 ENCOUNTER — Inpatient Hospital Stay (HOSPITAL_BASED_OUTPATIENT_CLINIC_OR_DEPARTMENT_OTHER): Payer: 59 | Admitting: Oncology

## 2021-02-02 ENCOUNTER — Encounter: Payer: Self-pay | Admitting: *Deleted

## 2021-02-02 VITALS — BP 129/79 | HR 84 | Temp 97.5°F | Resp 17 | Ht 63.0 in | Wt 136.1 lb

## 2021-02-02 DIAGNOSIS — Z170 Estrogen receptor positive status [ER+]: Secondary | ICD-10-CM | POA: Diagnosis not present

## 2021-02-02 DIAGNOSIS — Z807 Family history of other malignant neoplasms of lymphoid, hematopoietic and related tissues: Secondary | ICD-10-CM

## 2021-02-02 DIAGNOSIS — C50311 Malignant neoplasm of lower-inner quadrant of right female breast: Secondary | ICD-10-CM | POA: Diagnosis not present

## 2021-02-02 DIAGNOSIS — Z1379 Encounter for other screening for genetic and chromosomal anomalies: Secondary | ICD-10-CM | POA: Insufficient documentation

## 2021-02-02 DIAGNOSIS — Z800 Family history of malignant neoplasm of digestive organs: Secondary | ICD-10-CM

## 2021-02-02 DIAGNOSIS — Z5111 Encounter for antineoplastic chemotherapy: Secondary | ICD-10-CM | POA: Diagnosis not present

## 2021-02-02 DIAGNOSIS — Z5112 Encounter for antineoplastic immunotherapy: Secondary | ICD-10-CM | POA: Diagnosis present

## 2021-02-02 DIAGNOSIS — C773 Secondary and unspecified malignant neoplasm of axilla and upper limb lymph nodes: Secondary | ICD-10-CM | POA: Diagnosis not present

## 2021-02-02 DIAGNOSIS — Z171 Estrogen receptor negative status [ER-]: Secondary | ICD-10-CM | POA: Diagnosis not present

## 2021-02-02 DIAGNOSIS — Z79899 Other long term (current) drug therapy: Secondary | ICD-10-CM | POA: Diagnosis not present

## 2021-02-02 MED ORDER — LIDOCAINE-PRILOCAINE 2.5-2.5 % EX CREA: TOPICAL_CREAM | CUTANEOUS | 3 refills | Status: DC

## 2021-02-02 MED ORDER — DEXAMETHASONE 4 MG PO TABS: ORAL_TABLET | ORAL | 1 refills | Status: DC

## 2021-02-02 MED ORDER — PROCHLORPERAZINE MALEATE 10 MG PO TABS: ORAL_TABLET | ORAL | 1 refills | Status: DC

## 2021-02-02 NOTE — Telephone Encounter (Signed)
Revealed negative genetic testing.  Discussed that we do not know why she has breast cancer or why there is cancer in the family. It could be sporadic, due to a different gene that we are not testing, or maybe our current technology may not be able to pick something up.  It will be important for her to keep in contact with genetics to keep up with whether additional testing may be needed.  Language Line interpreter service used.

## 2021-02-02 NOTE — Progress Notes (Signed)
HPI:  Rebecca [name] was previously seen in the [facility] due to a personal history of cancer and concerns regarding a hereditary predisposition to cancer. Please refer to our prior cancer genetics clinic note for more information regarding our discussion, assessment and recommendations, at the time. Rebecca [name] recent genetic test results were disclosed to her, with the use of the LanguageLine interpreting service, as were recommendations warranted by these results. These results and recommendations are discussed in more detail below.  CANCER HISTORY:  Oncology History  Malignant neoplasm of lower-inner quadrant of right breast of female, estrogen receptor positive ([facility])  01/12/2021 Initial Diagnosis   Malignant neoplasm of lower-inner quadrant of right breast of female, estrogen receptor positive ([facility])   01/18/2021 Cancer Staging   Staging form: Breast, AJCC 8th Edition - Clinical: Stage IIIB (cT2, cN1, cM0, G3, ER-, PR-, HER2-) - Signed by Chauncey Cruel, MD on 01/18/2021 Stage prefix: Initial diagnosis Histologic grading system: 3 grade system   01/31/2021 Genetic Testing   PNegative hereditary cancer genetic testing: no pathogenic variants detected in [facility] CancerNext-Expanded + RNAinsight Panel.  The report date is January 31, 2021.  The CancerNext-Expanded gene panel offered by [facility] and includes sequencing, rearrangement, and RNA analysis for the following 77 genes: AIP, ALK, APC, ATM, AXIN2, BAP1, BARD1, BLM, BMPR1A, BRCA1, BRCA2, BRIP1, CDC73, CDH1, CDK4, CDKN1B, CDKN2A, CHEK2, CTNNA1, DICER1, FANCC, FH, FLCN, GALNT12, KIF1B, LZTR1, MAX, MEN1, MET, MLH1, MSH2, MSH3, MSH6, MUTYH, NBN, NF1, NF2, NTHL1, PALB2, PHOX2B, PMS2, POT1, PRKAR1A, PTCH1, PTEN, RAD51C, RAD51D, RB1, RECQL, RET, SDHA, SDHAF2, SDHB, SDHC, SDHD, SMAD4, SMARCA4, SMARCB1, SMARCE1, STK11, SUFU, TMEM127, TP53, TSC1, TSC2, VHL and XRCC2 (sequencing and deletion/duplication); EGFR, EGLN1,  HOXB13, KIT, MITF, PDGFRA, POLD1, and POLE (sequencing only); EPCAM and GREM1 (deletion/duplication only).    02/07/2021 -  Chemotherapy    Patient is on Treatment Plan: BREAST PEMBROLIZUMAB + CARBOPLATIN D1 + PACLITAXEL D1,8,15 Q21D X 4 CYCLES / PEMBROLIZUMAB + AC Q21D X 4 CYCLES        FAMILY HISTORY:  We obtained a detailed, 4-generation family history.  Significant diagnoses are listed below: Family History  Problem Relation Age of Onset  . Throat cancer Father        d. 45s  . Lymphoma Cousin        maternal cousin; d. 54s     Rebecca [name] has one sister and six brothers, all without a cancer history.  Her mother died in her 38s and did not have cancer.  She had a maternal cousin with lymphoma who passed away in his 46s.  Rebecca [name]'s father died in his 67s and had throat cancer.  No other paternal family history of cancer was reported.   Rebecca [name] is unaware of previous family history of genetic testing for hereditary cancer risks. Patient's maternal ancestors are of [place] descent, and paternal ancestors are of [place] descent. There is no reported Ashkenazi Jewish ancestry. Rebecca [name] parents are first cousins.   GENETIC TEST RESULTS: Genetic testing reported out on January 31, 2021.  The CancerNext-Expanded Panel through [facility] found no pathogenic mutations. The CancerNext-Expanded gene panel offered by [facility] and includes sequencing, rearrangement, and RNA analysis for the following 77 genes: AIP, ALK, APC, ATM, AXIN2, BAP1, BARD1, BLM, BMPR1A, BRCA1, BRCA2, BRIP1, CDC73, CDH1, CDK4, CDKN1B, CDKN2A, CHEK2, CTNNA1, DICER1, FANCC, FH, FLCN, GALNT12, KIF1B, LZTR1, MAX, MEN1, MET, MLH1, MSH2, MSH3, MSH6, MUTYH, NBN, NF1, NF2, NTHL1, PALB2, PHOX2B, PMS2, POT1, PRKAR1A, PTCH1, PTEN,  RAD51C, RAD51D, RB1, RECQL, RET, SDHA, SDHAF2, SDHB, SDHC, SDHD, SMAD4, SMARCA4, SMARCB1, SMARCE1, STK11, SUFU, TMEM127, TP53, TSC1, TSC2, VHL and XRCC2 (sequencing and  deletion/duplication); EGFR, EGLN1, HOXB13, KIT, MITF, PDGFRA, POLD1, and POLE (sequencing only); EPCAM and GREM1 (deletion/duplication only).   The test report has been scanned into EPIC and is located under the Molecular Pathology section of the Results Review tab.  A portion of the result report is included below for reference.    We discussed with Rebecca [name] that because current genetic testing is not perfect, it is possible there may be a gene mutation in one of these genes that current testing cannot detect, but that chance is small.  We also discussed, that there could be another gene that has not yet been discovered, or that we have not yet tested, that is responsible for the cancer diagnoses in the family. It is also possible there is a hereditary cause for the cancer in the family that Rebecca [name] did not inherit and therefore was not identified in her testing.  Therefore, it is important to remain in touch with cancer genetics in the future so that we can continue to offer Rebecca [name] the most up to date genetic testing.   ADDITIONAL GENETIC TESTING: We discussed with Rebecca [name] that her genetic testing was fairly extensive.  If there are genes identified to increase cancer risk that can be analyzed in the future, we would be happy to discuss and coordinate this testing at that time.    CANCER SCREENING RECOMMENDATIONS: Rebecca [name] test result is considered negative (normal).  This means that we have not identified a hereditary cause for her personal history of cancer at this time. Most cancers happen by chance and this negative test suggests that her cancer may fall into this category.    While reassuring, this does not definitively rule out a hereditary predisposition to cancer. It is still possible that there could be genetic mutations that are undetectable by current technology. There could be genetic mutations in genes that have not been tested or  identified to increase cancer risk.  Therefore, it is recommended she continue to follow the cancer management and screening guidelines provided by her oncology and primary healthcare provider.   An individual's cancer risk and medical management are not determined by genetic test results alone. Overall cancer risk assessment incorporates additional factors, including personal medical history, family history, and any available genetic information that may result in a personalized plan for cancer prevention and surveillance  RECOMMENDATIONS FOR FAMILY MEMBERS:  Individuals in this family might be at some increased risk of developing cancer, over the general population risk, simply due to the family history of cancer.  We recommended women in this family have a yearly mammogram beginning at age 25, or 36 years younger than the earliest onset of cancer, an annual clinical breast exam, and perform monthly breast self-exams. Women in this family should also have a gynecological exam as recommended by their primary provider. Family members should be referred for colonoscopy starting at age 48.  FOLLOW-UP: Lastly, we discussed with Rebecca [name] that cancer genetics is a rapidly advancing field and it is possible that new genetic tests will be appropriate for her and/or her family members in the future. We encouraged her to remain in contact with cancer genetics on an annual basis so we can update her personal and family histories and let her know of advances in cancer genetics that may benefit this family.  Our contact number was provided. Rebecca [name] questions were answered to her satisfaction, and she knows she is welcome to call us at anytime with additional questions or concerns.     Rigley Niess M. Joette Catching, [place], [facility] Genetic Counselor [email or web address] (P) [phone]

## 2021-02-03 ENCOUNTER — Other Ambulatory Visit: Payer: Self-pay

## 2021-02-03 ENCOUNTER — Other Ambulatory Visit (HOSPITAL_COMMUNITY): Payer: 59

## 2021-02-03 ENCOUNTER — Other Ambulatory Visit: Payer: 59

## 2021-02-03 ENCOUNTER — Other Ambulatory Visit: Payer: Self-pay | Admitting: Family Medicine

## 2021-02-03 NOTE — Progress Notes (Signed)
The following biosimilar Zarxio ([id number]) has been selected for use in this patient per insurance requirement.  Henreitta Leber, PharmD 02/03/21 @ [phone]

## 2021-02-06 ENCOUNTER — Other Ambulatory Visit: Payer: Self-pay

## 2021-02-06 DIAGNOSIS — C50311 Malignant neoplasm of lower-inner quadrant of right female breast: Secondary | ICD-10-CM

## 2021-02-06 DIAGNOSIS — Z170 Estrogen receptor positive status [ER+]: Secondary | ICD-10-CM

## 2021-02-07 ENCOUNTER — Inpatient Hospital Stay: Payer: 59

## 2021-02-07 ENCOUNTER — Encounter: Payer: Self-pay | Admitting: Licensed Clinical Social Worker

## 2021-02-07 ENCOUNTER — Ambulatory Visit: Payer: 59

## 2021-02-07 ENCOUNTER — Encounter: Payer: Self-pay | Admitting: *Deleted

## 2021-02-07 ENCOUNTER — Other Ambulatory Visit: Payer: 59

## 2021-02-07 ENCOUNTER — Other Ambulatory Visit: Payer: Self-pay

## 2021-02-07 VITALS — BP 109/65 | HR 79 | Temp 98.1°F | Resp 16

## 2021-02-07 DIAGNOSIS — Z170 Estrogen receptor positive status [ER+]: Secondary | ICD-10-CM

## 2021-02-07 DIAGNOSIS — C50311 Malignant neoplasm of lower-inner quadrant of right female breast: Secondary | ICD-10-CM

## 2021-02-07 DIAGNOSIS — Z5111 Encounter for antineoplastic chemotherapy: Secondary | ICD-10-CM | POA: Diagnosis not present

## 2021-02-07 DIAGNOSIS — Z95828 Presence of other vascular implants and grafts: Secondary | ICD-10-CM

## 2021-02-07 LAB — CBC WITH DIFFERENTIAL (CANCER CENTER ONLY)
Abs Immature Granulocytes: 0.01 10*3/uL (ref 0.00–0.07)
Basophils Absolute: 0.1 10*3/uL (ref 0.0–0.1)
Basophils Relative: 1 %
Eosinophils Absolute: 1.1 10*3/uL — ABNORMAL HIGH (ref 0.0–0.5)
Eosinophils Relative: 17 %
HCT: 38.9 % (ref 36.0–46.0)
Hemoglobin: 12.3 g/dL (ref 12.0–15.0)
Immature Granulocytes: 0 %
Lymphocytes Relative: 40 %
Lymphs Abs: 2.7 10*3/uL (ref 0.7–4.0)
MCH: 25.8 pg — ABNORMAL LOW (ref 26.0–34.0)
MCHC: 31.6 g/dL (ref 30.0–36.0)
MCV: 81.6 fL (ref 80.0–100.0)
Monocytes Absolute: 0.3 10*3/uL (ref 0.1–1.0)
Monocytes Relative: 5 %
Neutro Abs: 2.5 10*3/uL (ref 1.7–7.7)
Neutrophils Relative %: 37 %
Platelet Count: 322 10*3/uL (ref 150–400)
RBC: 4.77 MIL/uL (ref 3.87–5.11)
RDW: 13.2 % (ref 11.5–15.5)
WBC Count: 6.7 10*3/uL (ref 4.0–10.5)
nRBC: 0 % (ref 0.0–0.2)

## 2021-02-07 LAB — CMP (CANCER CENTER ONLY)
ALT: 13 U/L (ref 0–44)
AST: 19 U/L (ref 15–41)
Albumin: 4.1 g/dL (ref 3.5–5.0)
Alkaline Phosphatase: 53 U/L (ref 38–126)
Anion gap: 13 (ref 5–15)
BUN: 14 mg/dL (ref 6–20)
CO2: 25 mmol/L (ref 22–32)
Calcium: 9.4 mg/dL (ref 8.9–10.3)
Chloride: 102 mmol/L (ref 98–111)
Creatinine: 0.7 mg/dL (ref 0.44–1.00)
GFR, Estimated: >60 mL/min (ref >60)
Glucose, Bld: 109 mg/dL — ABNORMAL HIGH (ref 70–99)
Potassium: 4 mmol/L (ref 3.5–5.1)
Sodium: 140 mmol/L (ref 135–145)
Total Bilirubin: 0.3 mg/dL (ref 0.3–1.2)
Total Protein: 7.6 g/dL (ref 6.5–8.1)

## 2021-02-07 MED ORDER — ANTICOAGULANT SODIUM CITRATE 4% (200MG/5ML) IV SOLN
5.0000 mL | Freq: Once | Status: AC
Start: 2021-02-07 — End: 2021-02-07
  Administered 2021-02-07: 5 mL
  Filled 2021-02-07: qty 5

## 2021-02-07 MED ORDER — SODIUM CHLORIDE 0.9 % IV SOLN
150.0000 mg | Freq: Once | INTRAVENOUS | Status: AC
  Administered 2021-02-07: 150 mg via INTRAVENOUS
  Filled 2021-02-07: qty 150

## 2021-02-07 MED ORDER — SODIUM CHLORIDE 0.9% FLUSH
10.0000 mL | INTRAVENOUS | Status: DC | PRN
  Administered 2021-02-07: 10 mL
  Filled 2021-02-07: qty 10

## 2021-02-07 MED ORDER — DIPHENHYDRAMINE HCL 50 MG/ML IJ SOLN
INTRAMUSCULAR | Status: AC
  Filled 2021-02-07: qty 1

## 2021-02-07 MED ORDER — DIPHENHYDRAMINE HCL 50 MG/ML IJ SOLN
25.0000 mg | Freq: Once | INTRAMUSCULAR | Status: AC
  Administered 2021-02-07: 25 mg via INTRAVENOUS

## 2021-02-07 MED ORDER — SODIUM CHLORIDE 0.9 % IV SOLN
200.0000 mg | Freq: Once | INTRAVENOUS | Status: AC
  Administered 2021-02-07: 200 mg via INTRAVENOUS
  Filled 2021-02-07: qty 8

## 2021-02-07 MED ORDER — FAMOTIDINE IN NACL 20-0.9 MG/50ML-% IV SOLN
INTRAVENOUS | Status: AC
  Filled 2021-02-07: qty 50

## 2021-02-07 MED ORDER — SODIUM CHLORIDE 0.9 % IV SOLN
10.0000 mg | Freq: Once | INTRAVENOUS | Status: AC
  Administered 2021-02-07: 10 mg via INTRAVENOUS
  Filled 2021-02-07: qty 10

## 2021-02-07 MED ORDER — SODIUM CHLORIDE 0.9 % IV SOLN
80.0000 mg/m2 | Freq: Once | INTRAVENOUS | Status: AC
  Administered 2021-02-07: 132 mg via INTRAVENOUS
  Filled 2021-02-07: qty 22

## 2021-02-07 MED ORDER — SODIUM CHLORIDE 0.9 % IV SOLN
Freq: Once | INTRAVENOUS | Status: AC
  Filled 2021-02-07: qty 250

## 2021-02-07 MED ORDER — HEPARIN SOD (PORK) LOCK FLUSH 100 UNIT/ML IV SOLN
500.0000 [IU] | Freq: Once | INTRAVENOUS | Status: DC | PRN
  Filled 2021-02-07: qty 5

## 2021-02-07 MED ORDER — SODIUM CHLORIDE 0.9 % IV SOLN
540.0000 mg | Freq: Once | INTRAVENOUS | Status: AC
  Administered 2021-02-07: 540 mg via INTRAVENOUS
  Filled 2021-02-07: qty 54

## 2021-02-07 MED ORDER — SODIUM CHLORIDE 0.9% FLUSH
10.0000 mL | Freq: Once | INTRAVENOUS | Status: AC
  Administered 2021-02-07: 10 mL
  Filled 2021-02-07: qty 10

## 2021-02-07 MED ORDER — PALONOSETRON HCL INJECTION 0.25 MG/5ML
INTRAVENOUS | Status: AC
  Filled 2021-02-07: qty 5

## 2021-02-07 MED ORDER — PALONOSETRON HCL INJECTION 0.25 MG/5ML
0.2500 mg | Freq: Once | INTRAVENOUS | Status: AC
  Administered 2021-02-07: 0.25 mg via INTRAVENOUS

## 2021-02-07 MED ORDER — FAMOTIDINE IN NACL 20-0.9 MG/50ML-% IV SOLN
20.0000 mg | Freq: Once | INTRAVENOUS | Status: AC
  Administered 2021-02-07: 20 mg via INTRAVENOUS

## 2021-02-07 NOTE — Patient Instructions (Addendum)
[facility] Discharge Instructions for Patients Receiving Chemotherapy  Today you received the following chemotherapy agents:pembrolizumab, paclitaxel, carboplatin.  To help prevent nausea and vomiting after your treatment, we encourage you to take your nausea medication as directed   If you develop nausea and vomiting that is not controlled by your nausea medication, call the clinic.   BELOW ARE SYMPTOMS THAT SHOULD BE REPORTED IMMEDIATELY:  *FEVER GREATER THAN 100.5 F  *CHILLS WITH OR WITHOUT FEVER  NAUSEA AND VOMITING THAT IS NOT CONTROLLED WITH YOUR NAUSEA MEDICATION  *UNUSUAL SHORTNESS OF BREATH  *UNUSUAL BRUISING OR BLEEDING  TENDERNESS IN MOUTH AND THROAT WITH OR WITHOUT PRESENCE OF ULCERS  *URINARY PROBLEMS  *BOWEL PROBLEMS  UNUSUAL RASH Items with * indicate a potential emergency and should be followed up as soon as possible.  Feel free to call the clinic should you have any questions or concerns. The clinic phone number is [phone].  Please show the [facility] at check-in to the Emergency Department and triage nurse.  Pembrolizumab injection What is this medicine? PEMBROLIZUMAB (pem broe liz ue mab) is a monoclonal antibody. It is used to treat certain types of cancer. This medicine may be used for other purposes; ask your health care provider or pharmacist if you have questions. COMMON BRAND NAME(S): Keytruda What should I tell my health care provider before I take this medicine? They need to know if you have any of these conditions:  autoimmune diseases like Crohn's disease, ulcerative colitis, or lupus  have had or planning to have an allogeneic stem cell transplant (uses someone else's stem cells)  history of organ transplant  history of chest radiation  nervous system problems like myasthenia gravis or Guillain-Barre syndrome  an unusual or allergic reaction to pembrolizumab, other medicines, foods, dyes, or  preservatives  pregnant or trying to get pregnant  breast-feeding How should I use this medicine? This medicine is for infusion into a vein. It is given by a health care professional in a hospital or clinic setting. A special MedGuide will be given to you before each treatment. Be sure to read this information carefully each time. Talk to your pediatrician regarding the use of this medicine in children. While this drug may be prescribed for children as young as 6 months for selected conditions, precautions do apply. Overdosage: If you think you have taken too much of this medicine contact a poison control center or emergency room at once. NOTE: This medicine is only for you. Do not share this medicine with others. What if I miss a dose? It is important not to miss your dose. Call your doctor or health care professional if you are unable to keep an appointment. What may interact with this medicine? Interactions have not been studied. This list may not describe all possible interactions. Give your health care provider a list of all the medicines, herbs, non-prescription drugs, or dietary supplements you use. Also tell them if you smoke, drink alcohol, or use illegal drugs. Some items may interact with your medicine. What should I watch for while using this medicine? Your condition will be monitored carefully while you are receiving this medicine. You may need blood work done while you are taking this medicine. Do not become pregnant while taking this medicine or for 4 months after stopping it. Women should inform their doctor if they wish to become pregnant or think they might be pregnant. There is a potential for serious side effects to an unborn child. Talk  to your health care professional or pharmacist for more information. Do not breast-feed an infant while taking this medicine or for 4 months after the last dose. What side effects may I notice from receiving this medicine? Side effects that  you should report to your doctor or health care professional as soon as possible:  allergic reactions like skin rash, itching or hives, swelling of the face, lips, or tongue  bloody or black, tarry  breathing problems  changes in vision  chest pain  chills  confusion  constipation  cough  diarrhea  dizziness or feeling faint or lightheaded  fast or irregular heartbeat  fever  flushing  joint pain  low blood counts - this medicine may decrease the number of white blood cells, red blood cells and platelets. You may be at increased risk for infections and bleeding.  muscle pain  muscle weakness  pain, tingling, numbness in the hands or feet  persistent headache  redness, blistering, peeling or loosening of the skin, including inside the mouth  signs and symptoms of high blood sugar such as dizziness; dry mouth; dry skin; fruity breath; nausea; stomach pain; increased hunger or thirst; increased urination  signs and symptoms of kidney injury like trouble passing urine or change in the amount of urine  signs and symptoms of liver injury like dark urine, light-colored stools, loss of appetite, nausea, right upper belly pain, yellowing of the eyes or skin  sweating  swollen lymph nodes  weight loss Side effects that usually do not require medical attention (report to your doctor or health care professional if they continue or are bothersome):  decreased appetite  hair loss  tiredness This list may not describe all possible side effects. Call your doctor for medical advice about side effects. You may report side effects to FDA at 1-800-FDA-1088. Where should I keep my medicine? This drug is given in a hospital or clinic and will not be stored at home. NOTE: This sheet is a summary. It may not cover all possible information. If you have questions about this medicine, talk to your doctor, pharmacist, or health care provider.  2021 Elsevier/Gold Standard  (2019-09-09 21:44:53)  Paclitaxel injection What is this medicine? PACLITAXEL (PAK li TAX el) is a chemotherapy drug. It targets fast dividing cells, like cancer cells, and causes these cells to die. This medicine is used to treat ovarian cancer, breast cancer, lung cancer, Kaposi's sarcoma, and other cancers. This medicine may be used for other purposes; ask your health care provider or pharmacist if you have questions. COMMON BRAND NAME(S): Onxol, Taxol What should I tell my health care provider before I take this medicine? They need to know if you have any of these conditions:  history of irregular heartbeat  liver disease  low blood counts, like low white cell, platelet, or red cell counts  lung or breathing disease, like asthma  tingling of the fingers or toes, or other nerve disorder  an unusual or allergic reaction to paclitaxel, alcohol, polyoxyethylated castor oil, other chemotherapy, other medicines, foods, dyes, or preservatives  pregnant or trying to get pregnant  breast-feeding How should I use this medicine? This drug is given as an infusion into a vein. It is administered in a hospital or clinic by a specially trained health care professional. Talk to your pediatrician regarding the use of this medicine in children. Special care may be needed. Overdosage: If you think you have taken too much of this medicine contact a poison control center or  emergency room at once. NOTE: This medicine is only for you. Do not share this medicine with others. What if I miss a dose? It is important not to miss your dose. Call your doctor or health care professional if you are unable to keep an appointment. What may interact with this medicine? Do not take this medicine with any of the following medications:  live virus vaccines This medicine may also interact with the following medications:  antiviral medicines for hepatitis, HIV or AIDS  certain antibiotics like erythromycin and  clarithromycin  certain medicines for fungal infections like ketoconazole and itraconazole  certain medicines for seizures like carbamazepine, phenobarbital, phenytoin  gemfibrozil  nefazodone  rifampin  St. John's wort This list may not describe all possible interactions. Give your health care provider a list of all the medicines, herbs, non-prescription drugs, or dietary supplements you use. Also tell them if you smoke, drink alcohol, or use illegal drugs. Some items may interact with your medicine. What should I watch for while using this medicine? Your condition will be monitored carefully while you are receiving this medicine. You will need important blood work done while you are taking this medicine. This medicine can cause serious allergic reactions. To reduce your risk you will need to take other medicine(s) before treatment with this medicine. If you experience allergic reactions like skin rash, itching or hives, swelling of the face, lips, or tongue, tell your doctor or health care professional right away. In some cases, you may be given additional medicines to help with side effects. Follow all directions for their use. This drug may make you feel generally unwell. This is not uncommon, as chemotherapy can affect healthy cells as well as cancer cells. Report any side effects. Continue your course of treatment even though you feel ill unless your doctor tells you to stop. Call your doctor or health care professional for advice if you get a fever, chills or sore throat, or other symptoms of a cold or flu. Do not treat yourself. This drug decreases your body's ability to fight infections. Try to avoid being around people who are sick. This medicine may increase your risk to bruise or bleed. Call your doctor or health care professional if you notice any unusual bleeding. Be careful brushing and flossing your teeth or using a toothpick because you may get an infection or bleed more easily.  If you have any dental work done, tell your dentist you are receiving this medicine. Avoid taking products that contain aspirin, acetaminophen, ibuprofen, naproxen, or ketoprofen unless instructed by your doctor. These medicines may hide a fever. Do not become pregnant while taking this medicine. Women should inform their doctor if they wish to become pregnant or think they might be pregnant. There is a potential for serious side effects to an unborn child. Talk to your health care professional or pharmacist for more information. Do not breast-feed an infant while taking this medicine. Men are advised not to father a child while receiving this medicine. This product may contain alcohol. Ask your pharmacist or healthcare provider if this medicine contains alcohol. Be sure to tell all healthcare providers you are taking this medicine. Certain medicines, like metronidazole and disulfiram, can cause an unpleasant reaction when taken with alcohol. The reaction includes flushing, headache, nausea, vomiting, sweating, and increased thirst. The reaction can last from 30 minutes to several hours. What side effects may I notice from receiving this medicine? Side effects that you should report to your doctor or health  care professional as soon as possible:  allergic reactions like skin rash, itching or hives, swelling of the face, lips, or tongue  breathing problems  changes in vision  fast, irregular heartbeat  high or low blood pressure  mouth sores  pain, tingling, numbness in the hands or feet  signs of decreased platelets or bleeding - bruising, pinpoint red spots on the skin, black, tarry stools, blood in the urine  signs of decreased red blood cells - unusually weak or tired, feeling faint or lightheaded, falls  signs of infection - fever or chills, cough, sore throat, pain or difficulty passing urine  signs and symptoms of liver injury like dark yellow or brown urine; general ill feeling or  flu-like symptoms; light-colored stools; loss of appetite; nausea; right upper belly pain; unusually weak or tired; yellowing of the eyes or skin  swelling of the ankles, feet, hands  unusually slow heartbeat Side effects that usually do not require medical attention (report to your doctor or health care professional if they continue or are bothersome):  diarrhea  hair loss  loss of appetite  muscle or joint pain  nausea, vomiting  pain, redness, or irritation at site where injected  tiredness This list may not describe all possible side effects. Call your doctor for medical advice about side effects. You may report side effects to FDA at 1-800-FDA-1088. Where should I keep my medicine? This drug is given in a hospital or clinic and will not be stored at home. NOTE: This sheet is a summary. It may not cover all possible information. If you have questions about this medicine, talk to your doctor, pharmacist, or health care provider.  2021 Elsevier/Gold Standard (2019-09-09 13:37:23)  Carboplatin injection What is this medicine? CARBOPLATIN (KAR boe pla tin) is a chemotherapy drug. It targets fast dividing cells, like cancer cells, and causes these cells to die. This medicine is used to treat ovarian cancer and many other cancers. This medicine may be used for other purposes; ask your health care provider or pharmacist if you have questions. COMMON BRAND NAME(S): Paraplatin What should I tell my health care provider before I take this medicine? They need to know if you have any of these conditions:  blood disorders  hearing problems  kidney disease  recent or ongoing radiation therapy  an unusual or allergic reaction to carboplatin, cisplatin, other chemotherapy, other medicines, foods, dyes, or preservatives  pregnant or trying to get pregnant  breast-feeding How should I use this medicine? This drug is usually given as an infusion into a vein. It is administered in a  hospital or clinic by a specially trained health care professional. Talk to your pediatrician regarding the use of this medicine in children. Special care may be needed. Overdosage: If you think you have taken too much of this medicine contact a poison control center or emergency room at once. NOTE: This medicine is only for you. Do not share this medicine with others. What if I miss a dose? It is important not to miss a dose. Call your doctor or health care professional if you are unable to keep an appointment. What may interact with this medicine?  medicines for seizures  medicines to increase blood counts like filgrastim, pegfilgrastim, sargramostim  some antibiotics like amikacin, gentamicin, neomycin, streptomycin, tobramycin  vaccines Talk to your doctor or health care professional before taking any of these medicines:  acetaminophen  aspirin  ibuprofen  ketoprofen  naproxen This list may not describe all possible interactions.  Give your health care provider a list of all the medicines, herbs, non-prescription drugs, or dietary supplements you use. Also tell them if you smoke, drink alcohol, or use illegal drugs. Some items may interact with your medicine. What should I watch for while using this medicine? Your condition will be monitored carefully while you are receiving this medicine. You will need important blood work done while you are taking this medicine. This drug may make you feel generally unwell. This is not uncommon, as chemotherapy can affect healthy cells as well as cancer cells. Report any side effects. Continue your course of treatment even though you feel ill unless your doctor tells you to stop. In some cases, you may be given additional medicines to help with side effects. Follow all directions for their use. Call your doctor or health care professional for advice if you get a fever, chills or sore throat, or other symptoms of a cold or flu. Do not treat  yourself. This drug decreases your body's ability to fight infections. Try to avoid being around people who are sick. This medicine may increase your risk to bruise or bleed. Call your doctor or health care professional if you notice any unusual bleeding. Be careful brushing and flossing your teeth or using a toothpick because you may get an infection or bleed more easily. If you have any dental work done, tell your dentist you are receiving this medicine. Avoid taking products that contain aspirin, acetaminophen, ibuprofen, naproxen, or ketoprofen unless instructed by your doctor. These medicines may hide a fever. Do not become pregnant while taking this medicine. Women should inform their doctor if they wish to become pregnant or think they might be pregnant. There is a potential for serious side effects to an unborn child. Talk to your health care professional or pharmacist for more information. Do not breast-feed an infant while taking this medicine. What side effects may I notice from receiving this medicine? Side effects that you should report to your doctor or health care professional as soon as possible:  allergic reactions like skin rash, itching or hives, swelling of the face, lips, or tongue  signs of infection - fever or chills, cough, sore throat, pain or difficulty passing urine  signs of decreased platelets or bleeding - bruising, pinpoint red spots on the skin, black, tarry stools, nosebleeds  signs of decreased red blood cells - unusually weak or tired, fainting spells, lightheadedness  breathing problems  changes in hearing  changes in vision  chest pain  high blood pressure  low blood counts - This drug may decrease the number of white blood cells, red blood cells and platelets. You may be at increased risk for infections and bleeding.  nausea and vomiting  pain, swelling, redness or irritation at the injection site  pain, tingling, numbness in the hands or  feet  problems with balance, talking, walking  trouble passing urine or change in the amount of urine Side effects that usually do not require medical attention (report to your doctor or health care professional if they continue or are bothersome):  hair loss  loss of appetite  metallic taste in the mouth or changes in taste This list may not describe all possible side effects. Call your doctor for medical advice about side effects. You may report side effects to FDA at 1-800-FDA-1088. Where should I keep my medicine? This drug is given in a hospital or clinic and will not be stored at home. NOTE: This sheet is a summary.  It may not cover all possible information. If you have questions about this medicine, talk to your doctor, pharmacist, or health care provider.  2021 Elsevier/Gold Standard (2008-01-13 14:38:05)

## 2021-02-07 NOTE — Progress Notes (Signed)
[facility] CSW Progress Note  Clinical Education officer, museum received TC from patient & friend inquiring about status of applications. CSW gave update that all applications have been submitted and received by the foundations. [facility] will likely respond first, but [facility] in [place], [place], and [employer] may take 30-60 days. Pt voiced understanding.    Christeen Douglas , LCSW

## 2021-02-08 ENCOUNTER — Telehealth: Payer: Self-pay | Admitting: *Deleted

## 2021-02-08 ENCOUNTER — Other Ambulatory Visit: Payer: 59

## 2021-02-08 ENCOUNTER — Telehealth: Payer: Self-pay | Admitting: Oncology

## 2021-02-08 NOTE — Telephone Encounter (Signed)
-----   Message from Marylu Lund, RN sent at 02/07/2021 10:34 AM EDT ----- Regarding: Dr Jana Hakim first chemo follow up First chemo follow up

## 2021-02-08 NOTE — Telephone Encounter (Signed)
Scheduled appts per 4/18 sch msg. Will have updated calendar printed for pt at next visit.

## 2021-02-08 NOTE — Telephone Encounter (Signed)
Left message for pt pt call back to let us know if she has any questions or concerns regarding her treatment yest through [place] interpreter.

## 2021-02-09 ENCOUNTER — Encounter: Payer: Self-pay | Admitting: Oncology

## 2021-02-09 NOTE — Progress Notes (Signed)
Located Clinical Intake Worksheet via fax from [employer] which was in the pharmacy batch and not [facility].  Obtained selection from provider.  Faxed to [employer]. Fax received ok per confirmation sheet.

## 2021-02-13 ENCOUNTER — Other Ambulatory Visit: Payer: Self-pay

## 2021-02-13 ENCOUNTER — Other Ambulatory Visit: Payer: Self-pay | Admitting: Oncology

## 2021-02-13 DIAGNOSIS — C50311 Malignant neoplasm of lower-inner quadrant of right female breast: Secondary | ICD-10-CM

## 2021-02-13 DIAGNOSIS — Z170 Estrogen receptor positive status [ER+]: Secondary | ICD-10-CM

## 2021-02-13 NOTE — Progress Notes (Unsigned)
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 She was evaluated in the multidisciplinary breast cancer clinic on 01/18/2021.  She is accompanied by interpreter  Since consultation, she has port placed on 01/26/2021.  She has some discomfort in the right shoulder and neck associated with this.  She underwent staging chest CT on 01/27/2021 showing: moderately enlarged right axillary lymph node with internal biopsy clip; no additional sites of metastatic disease.  She also underwent breast MRI on 01/30/2021 showing: breast composition B; two adjacent lower right breast masses, spanning 2.3 cm, compatible with biopsy-proven malignancy; no other suspicious right breast abnormalities; single enlarged right axillary lymph node compatible with biopsy-proven metastasis; no evidence of left breast malignancy.  She also underwent echocardiogram on 01/31/2021 showing an ejection fraction of 60-65%.  Finally, she also underwent bone scan yesterday, 02/01/2021, showing: No evidence of metastatic disease  REVIEW OF SYSTEMS: Rayvon wants to know the cause of her cancer.  She says she has had a great deal of drama in her life following her separation 5 years ago and wonders if that is the cause.  She was told that she  should eat no sugar because cancer eats sugar and that if she does not eat sugar the cancer will shrink.  We discussed that at length today.  She also had many questions regarding diet which we addressed.  In the middle of the visit she had an episode of apparent hypoglycemia and we gave her a soft drink which was helpful.  She only had a few sips.  A detailed review of systems today was otherwise stable.   COVID 19 VACCINATION STATUS: fully vaccinated [employer]), with booster 12/2020   HISTORY OF CURRENT ILLNESS: Sianna A Dyar herself palpated a mass in the right axilla. She underwent bilateral diagnostic mammography with tomography and right breast ultrasonography at [facility] on 01/03/2021 showing: breast density category B; two adjacent suspicious right breast masses at 5 o'clock; markedly abnormal right axillary lymph node; no evidence of malignancy on left.  Accordingly on 01/06/2021 she proceeded to biopsy of the right breast area in question. The pathology from this procedure ([id number]) showed: invasive ductal carcinoma, grade 3. Prognostic indicators significant for: estrogen receptor, 10% positive with weak staining intensity and progesterone receptor, 0% negative. Proliferation marker Ki67 at 90%. HER2 equivocal by immunohistochemistry (2+), but negative by fluorescent in situ hybridization with a signals ratio 1.38 and number per cell 3.63.  Cancer Staging Malignant neoplasm of lower-inner quadrant of right breast of female, estrogen receptor positive ([place]) Staging form: Breast, AJCC 8th Edition - Clinical: Stage IIIB (cT2, cN1, cM0, G3, ER-, PR-, HER2-) - Signed by Chauncey Cruel, MD on 01/18/2021 Stage prefix: Initial diagnosis Histologic grading system: 3 grade system  [name] is as detailed below.   PAST MEDICAL HISTORY: Past Medical History:  Diagnosis Date  . Arthritis   .  Breast cancer ([place])   . Diabetes mellitus   . Family history  of lymphoma 01/18/2021  . Family history of throat cancer 01/18/2021  . Hyperlipidemia   . Hypertension     PAST SURGICAL HISTORY: Past Surgical History:  Procedure Laterality Date  . CESAREAN SECTION    . PORTACATH PLACEMENT N/A 01/26/2021   Procedure: INSERTION PORT-A-CATH;  Surgeon: Erroll Luna, MD;  Location: [facility] OR;  Service: General;  Laterality: N/A;  3    FAMILY HISTORY: Family History  Problem Relation Age of Onset  . Diabetes Mother   . Heart disease Mother   . Diabetes Father   . Throat cancer Father        d. 75s  . Diabetes Sister   . Diabetes Brother   . Lymphoma Cousin        maternal cousin; d. 46s   Her father died in his 27's and her mother in her 56'O, both from complications of diabetes. Delsa has 6 brothers and 1 sister. There is no family history of breast or ovarian cancer to her knowledge.   GYNECOLOGIC HISTORY:  No LMP recorded. Menarche: 55 years old [place] P 0 LMP 10/31/2020 (before that, 06/2020) Contraceptive used from 2017-2018 HRT n/a  Hysterectomy? no BSO? no   SOCIAL HISTORY: (updated 12/2020)  Meridee is originally from [place]. 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 She is divorced. She lives at home with her nephew [name], who is 11 and works for [employer]. She attends [employer]    ADVANCED DIRECTIVES: not in place; at the 01/18/2021 visit she stated her ex-husband [name], who is disabled due to past injuries ([phone]) , would be the person to contact if she became incapacitated [the patient states aside from her nephew she has no other relatives in [place]: Social History   Tobacco Use  . Smoking status: Passive Smoke Exposure - Never Smoker  . Smokeless tobacco: Never Used  Substance Use Topics  . Alcohol use: No  . Drug use: No     Colonoscopy: never done, referral in place  PAP: 12/2020,  negative  Bone density: never done   Allergies  Allergen Reactions  . Lisinopril Swelling    Swelling in throat, coughing, difficulty swallowing, could not sleep. Occurred Dec 2020.    Current Outpatient Medications  Medication Sig Dispense Refill  . amLODipine (NORVASC) 10 MG tablet TAKE 1 TABLET BY MOUTH EVERY DAY 90 tablet 0  . Ascorbic Acid (VITAMIN C) 1000 MG tablet Take 1,000 mg by mouth daily.    Marland Kitchen atorvastatin (LIPITOR) 80 MG tablet TAKE 1 TABLET (80 MG TOTAL) BY MOUTH DAILY. 90 tablet 1  . Blood Glucose Monitoring Suppl (ONETOUCH VERIO REFLECT) w/Device KIT 1 kit by Does not apply route in the morning, at noon, and at bedtime. Use as directed to test blood sugar up to three times daily.E11.9 1 kit 0  . cetirizine (ZYRTEC) 10 MG tablet Take 1 tablet (10 mg total) by mouth daily. (Patient not taking: No sig reported) 30 tablet 0  . cloNIDine (CATAPRES) 0.1 MG tablet Take 1 tablet (0.1 mg total) by mouth at bedtime as needed. (Patient not taking: No sig reported) 90 tablet 1  . clotrimazole (CLOTRIMAZOLE AF) 1 % cream Apply 1 application topically 2 (two) times daily. 30 g 1  . cyclobenzaprine (FLEXERIL) 10 MG tablet Take 10 mg by mouth 3 (three) times  daily as needed for muscle spasms.    . dapagliflozin propanediol (FARXIGA) 10 MG TABS tablet Take 1 tablet (10 mg total) by mouth daily before breakfast. 90 tablet 1  . dexamethasone (DECADRON) 4 MG tablet TAKE 2 TABLETS ONCE A DAY FOR 3 DAYS AFTER CARBOPLATIN AND AC CHEMOTHERAPY. TAKE WITH FOOD. 30 tablet 1  . ergocalciferol (DRISDOL) 1.25 MG (50000 UT) capsule Take 1 capsule (50,000 Units total) by mouth once a week. 12 capsule 0  . glipiZIDE (GLUCOTROL) 5 MG tablet TAKE 1 TABLET BY MOUTH DAILY (Patient taking differently: Take 5 mg by mouth daily before breakfast.) 90 tablet 1  . glucose blood (ONETOUCH VERIO) test strip Use as directed to test blood sugar up to three times daily. E11.9 100 each 6  . Lancets (ONETOUCH DELICA PLUS  [id number]) MISC 1 Device by Does not apply route in the morning, at noon, and at bedtime. Use as directed to test blood sugar up to three times daily. E11.9 100 each 6  . lidocaine-prilocaine (EMLA) cream APPLY TO AFFECTED AREA AS DIRECTED 30 g 3  . LORazepam (ATIVAN) 0.5 MG tablet Take 1 tablet (0.5 mg total) by mouth at bedtime. 30 tablet 0  . LORazepam (ATIVAN) 0.5 MG tablet Take 1 tablet by mouth at bedtime. 30 tablet 0  . metFORMIN (GLUCOPHAGE) 500 MG tablet TAKE 1 TABLET BY MOUTH TWICE DAILY (Patient taking differently: Take 500 mg by mouth 2 (two) times daily with a meal.) 180 tablet 0  . naproxen (NAPROSYN) 500 MG tablet Take 1 tablet (500 mg total) by mouth 2 (two) times daily with a meal. (Patient not taking: No sig reported) 30 tablet 1  . Omega-3 1000 MG CAPS Take 1,000 mg by mouth daily.    Marland Kitchen omeprazole (PRILOSEC) 20 MG capsule Take 1 capsule (20 mg total) by mouth daily. (Patient not taking: No sig reported) 30 capsule 0  . oxyCODONE (OXY IR/ROXICODONE) 5 MG immediate release tablet Take 1 tablet (5 mg total) by mouth every 6 (six) hours as needed for severe pain. 15 tablet 0  . polyethylene glycol (MIRALAX / GLYCOLAX) 17 g packet TAKE 17 G BY MOUTH DAILY. (Patient not taking: No sig reported) 14 each 0  . prochlorperazine (COMPAZINE) 10 MG tablet TAKE 1 TABLET BY MOUTH EVERY 6 HOURS AS NEEDED (NAUSEA OR VOMITING). 30 tablet 1  . vitamin B-12 (CYANOCOBALAMIN) 100 MCG tablet Take 100 mcg by mouth daily.     No current facility-administered medications for this visit.    OBJECTIVE: [place] woman in no acute distress  There were no vitals filed for this visit.   There is no height or weight on file to calculate BMI.   Wt Readings from Last 3 Encounters:  02/02/21 136 lb 1.6 oz (61.7 kg)  01/26/21 139 lb 8.8 oz (63.3 kg)  01/18/21 139 lb 9.6 oz (63.3 kg)      ECOG FS:1 - Symptomatic but completely ambulatory  Sclerae unicteric, EOMs intact Wearing a mask No cervical or  supraclavicular adenopathy Lungs no rales or rhonchi Heart regular rate and rhythm Abd soft, nontender, positive bowel sounds MSK no focal spinal tenderness, no upper extremity lymphedema Neuro: nonfocal, well oriented, appropriate affect Breasts: I had more difficulty today palpating the mass in the right axilla.  I did not palpate a mass in the right breast.  The left breast and left axilla are benign.   LAB RESULTS:  CMP     Component Value Date/Time   NA 140 02/07/2021  0756   NA 140 12/27/2020 1123   K 4.0 02/07/2021 0756   CL 102 02/07/2021 0756   CO2 25 02/07/2021 0756   GLUCOSE 109 (H) 02/07/2021 0756   BUN 14 02/07/2021 0756   BUN 11 12/27/2020 1123   CREATININE 0.70 02/07/2021 0756   CREATININE 0.50 11/02/2016 1102   CALCIUM 9.4 02/07/2021 0756   PROT 7.6 02/07/2021 0756   PROT 7.5 12/27/2020 1123   ALBUMIN 4.1 02/07/2021 0756   ALBUMIN 4.7 12/27/2020 1123   AST 19 02/07/2021 0756   ALT 13 02/07/2021 0756   ALKPHOS 53 02/07/2021 0756   BILITOT 0.3 02/07/2021 0756   GFRNONAA >60 02/07/2021 0756   GFRNONAA >89 01/16/2016 1006   GFRAA 118 06/02/2020 0942   GFRAA >89 01/16/2016 1006    No results found for: TOTALPROTELP, ALBUMINELP, A1GS, A2GS, BETS, BETA2SER, GAMS, MSPIKE, SPEI  Lab Results  Component Value Date   WBC 6.7 02/07/2021   NEUTROABS 2.5 02/07/2021   HGB 12.3 02/07/2021   HCT 38.9 02/07/2021   MCV 81.6 02/07/2021   PLT 322 02/07/2021    No results found for: LABCA2  No components found for: [id number]  No results for input(s): INR in the last 168 hours.  No results found for: LABCA2  No results found for: KKX381  No results found for: WEX937  No results found for: JIR678  No results found for: CA2729  No components found for: HGQUANT  No results found for: CEA1 / No results found for: CEA1   No results found for: AFPTUMOR  No results found for: CHROMOGRNA  No results found for: KPAFRELGTCHN, LAMBDASER,  KAPLAMBRATIO (kappa/lambda light chains)  No results found for: HGBA, HGBA2QUANT, HGBFQUANT, HGBSQUAN (Hemoglobinopathy evaluation)   No results found for: LDH  No results found for: IRON, TIBC, IRONPCTSAT (Iron and TIBC)  No results found for: FERRITIN  Urinalysis    Component Value Date/Time   COLORURINE YELLOW 06/24/2007 0116   APPEARANCEUR CLOUDY (A) 06/24/2007 0116   LABSPEC 1.025 09/18/2020 1125   PHURINE 5.5 09/18/2020 1125   GLUCOSEU 100 (A) 09/18/2020 1125   HGBUR TRACE (A) 09/18/2020 1125   BILIRUBINUR NEGATIVE 09/18/2020 1125   KETONESUR NEGATIVE 09/18/2020 1125   PROTEINUR NEGATIVE 09/18/2020 1125   UROBILINOGEN 0.2 09/18/2020 1125   NITRITE NEGATIVE 09/18/2020 1125   LEUKOCYTESUR NEGATIVE 09/18/2020 1125     STUDIES: CT Chest W Contrast  Result Date: 01/29/2021 CLINICAL DATA:  New diagnosis of right breast cancer. Staging evaluation. EXAM: CT CHEST WITH CONTRAST TECHNIQUE: Multidetector CT imaging of the chest was performed during intravenous contrast administration. CONTRAST:  19m OMNIPAQUE IOHEXOL 300 MG/ML  SOLN COMPARISON:  Chest radiograph from one day prior. FINDINGS: Cardiovascular: Normal heart size. No significant pericardial effusion/thickening. Right internal jugular Port-A-Cath terminates in the lower third of the SVC. Expected mild subcutaneous emphysema surrounding the port in the supraclavicular right neck compatible with recent port placement. Normal course and caliber of the thoracic aorta. Dilated main pulmonary artery (3.5 cm diameter). No central pulmonary emboli. Mediastinum/Nodes: No discrete thyroid nodules. Unremarkable esophagus. Enlarged 2.4 cm short axis diameter right axillary node with internal biopsy clip (series 2/image 43). No additional pathologically enlarged axillary nodes. No pathologically enlarged mediastinal or hilar nodes. Lungs/Pleura: No pneumothorax. No pleural effusion. No acute consolidative airspace disease, lung masses or  significant pulmonary nodules. Mild patchy subpleural ground-glass opacity and reticulation at both lung apices, right greater than left. Mild hypoventilatory changes in the dependent lower lobes. Upper abdomen: No acute  abnormality. Musculoskeletal:  No aggressive appearing focal osseous lesions. IMPRESSION: 1. Moderately enlarged right axillary lymph node with internal biopsy clip, compatible with right axillary nodal metastasis. 2. No additional sites of metastatic disease in the chest. 3. Mild patchy subpleural ground-glass opacity and reticulation at both lung apices, right greater than left, nonspecific, favor postinfectious scarring. 4. Dilated main pulmonary artery, suggesting pulmonary arterial hypertension. Electronically Signed   By: Ilona Sorrel M.D.   On: 01/29/2021 19:51   NM Bone Scan Whole Body  Result Date: 02/02/2021 CLINICAL DATA:  Newly diagnosed breast cancer EXAM: NUCLEAR MEDICINE WHOLE BODY BONE SCAN TECHNIQUE: Whole body anterior and posterior images were obtained approximately 3 hours after intravenous injection of radiopharmaceutical. RADIOPHARMACEUTICALS:  21.4 mCi Technetium-52mMDP IV COMPARISON:  CT chest 01/27/2021 FINDINGS: Physiologic distribution of radiotracer with renal uptake and excretion. No focal abnormal radiotracer accumulation within the axial or appendicular skeleton to suggest metastatic disease. IMPRESSION: No scintigraphic evidence of osseous metastatic disease. Electronically Signed   By: NDavina PokeD.O.   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 LABS:  Not performed EXAM: BILATERAL BREAST MRI WITH AND WITHOUT CONTRAST TECHNIQUE: Multiplanar, multisequence MR images of both breasts were obtained prior to and following the intravenous administration of 6 ml of Gadavist  Three-dimensional MR images were rendered by post-processing of the original MR data on an independent workstation. The three-dimensional MR images were interpreted, and findings are reported in the following complete MRI report for this study. Three dimensional images were evaluated at the independent interpreting workstation using the DynaCAD thin client. COMPARISON:  Prior mammograms and ultrasounds FINDINGS: Breast composition: b. Scattered fibroglandular tissue. Background parenchymal enhancement: Minimal Right breast: Two adjacent 2 cm masses are identified within the LOWER RIGHT breast, middle to posterior depth with biopsy clip noted within the anterior mass, compatible with biopsy-proven malignancy. The 2 masses together measure 2.1 x 2.3 x 1.5 cm (transverse x AP x CC) No other suspicious mass or enhancement identified within the RIGHT breast. Left breast: No mass or abnormal enhancement. Lymph nodes: A single abnormal enlarged RIGHT axillary lymph node is noted containing biopsy clip artifact and compatible with metastasis involving a RIGHT axillary lymph node. No other abnormal lymph nodes are identified. Ancillary findings:  None. IMPRESSION: 1. Two adjacent LOWER RIGHT breast masses, spanning an area measuring 2.1 x 2.3 x 1.5 cm, compatible with biopsy-proven malignancy. No other suspicious RIGHT breast abnormalities noted. 2. Single enlarged RIGHT axillary lymph node compatible with biopsy-proven metastasis. 3. No evidence of LEFT breast malignancy. RECOMMENDATION: Treatment plan BI-RADS CATEGORY  6: Known biopsy-proven malignancy. Electronically Signed   By: JMargarette CanadaM.D.   On: 01/30/2021 12:08   DG Chest Port 1 View  Result Date: 01/26/2021 CLINICAL DATA:  Status post Port-A-Cath insertion. EXAM: PORTABLE CHEST 1 VIEW COMPARISON:  11/21/2019. FINDINGS: Right IJ approach Port-A-Cath with the tip projecting at the superior right cavoatrial junction. No visible kink in the catheter. No visible  pneumothorax or pleural effusions. No consolidation. Cardiomediastinal silhouette is within normal limits and similar to prior. IMPRESSION: 1. Right IJ approach Port-A-Cath with the tip projecting at the superior right cavoatrial junction. 2. No acute cardiopulmonary disease. No visible pneumothorax. Electronically Signed   By: FMargaretha SheffieldMD   On: 01/26/2021 13:41   DG Fluoro Guide CV Line-No  Report  Result Date: 01/26/2021 Fluoroscopy was utilized by the requesting physician.  No radiographic interpretation.   ECHOCARDIOGRAM COMPLETE  Result Date: 01/31/2021    ECHOCARDIOGRAM REPORT   Patient Name:   HADEEL HILLEBRAND Date of Exam: 01/31/2021 Medical Rec #:  [id number]            Height:       63.0 in Accession #:    [id number]           Weight:       139.6 lb Date of Birth:  03-20-1966            BSA:          1.659 m Patient Age:    83 years             BP:           115/75 mmHg Patient Gender: F                    HR:           79 bpm. Exam Location:  Outpatient Procedure: 2D Echo, 3D Echo, Cardiac Doppler, Color Doppler and Strain Analysis Indications:    Z51.11 Encounter for antineoplastic chemotheraphy  History:        Patient has no prior history of Echocardiogram examinations.                 Risk Factors:Hypertension, Diabetes and Dyslipidemia. Breast                 Cancer.  Sonographer:    Jonelle Sidle Dance Referring Phys: [place]  1. Left ventricular ejection fraction, by estimation, is 60 to 65%. Left ventricular ejection fraction by 3D volume is 60 %. The left ventricle has normal function. The left ventricle has no regional wall motion abnormalities. There is mild asymmetric left ventricular hypertrophy of the basal-septal segment. Left ventricular diastolic parameters are consistent with Grade I diastolic dysfunction (impaired relaxation).  2. Right ventricular systolic function is normal. The right ventricular size is normal. There is normal pulmonary artery  systolic pressure.  3. The mitral valve is normal in structure. Trivial mitral valve regurgitation.  4. The aortic valve is tricuspid. Aortic valve regurgitation is not visualized. No aortic stenosis is present.  5. The inferior vena cava is normal in size with greater than 50% respiratory variability, suggesting right atrial pressure of 3 mmHg. Comparison(s): No prior Echocardiogram. FINDINGS  Left Ventricle: Left ventricular ejection fraction, by estimation, is 60 to 65%. Left ventricular ejection fraction by 3D volume is 60 %. The left ventricle has normal function. The left ventricle has no regional wall motion abnormalities. Global longitudinal strain performed but not reported based on interpreter judgement due to suboptimal tracking. The left ventricular internal cavity size was normal in size. There is mild asymmetric left ventricular hypertrophy of the basal-septal segment. Left ventricular diastolic parameters are consistent with Grade I diastolic dysfunction (impaired relaxation). Right Ventricle: The right ventricular size is normal. No increase in right ventricular wall thickness. Right ventricular systolic function is normal. There is normal pulmonary artery systolic pressure. The tricuspid regurgitant velocity is 1.95 m/s, and  with an assumed right atrial pressure of 3 mmHg, the estimated right ventricular systolic pressure is 09.4 mmHg. Left Atrium: Left atrial size was normal in size. Right Atrium: Right atrial size was normal in size. Pericardium: There is no evidence of pericardial effusion. Mitral Valve: The mitral valve is normal in  structure. There is mild thickening of the mitral valve leaflet(s). Trivial mitral valve regurgitation. Tricuspid Valve: The tricuspid valve is normal in structure. Tricuspid valve regurgitation is trivial. Aortic Valve: The aortic valve is tricuspid. Aortic valve regurgitation is not visualized. No aortic stenosis is present. Pulmonic Valve: The pulmonic valve was  normal in structure. Pulmonic valve regurgitation is not visualized. Aorta: The aortic root and ascending aorta are structurally normal, with no evidence of dilitation. Venous: The inferior vena cava is normal in size with greater than 50% respiratory variability, suggesting right atrial pressure of 3 mmHg. IAS/Shunts: The interatrial septum is aneurysmal. No atrial level shunt detected by color flow Doppler.  LEFT VENTRICLE PLAX 2D LVIDd:         3.90 cm         Diastology LVIDs:         2.50 cm         LV e' medial:    5.98 cm/s LV PW:         1.10 cm         LV E/e' medial:  12.4 LV IVS:        0.90 cm         LV e' lateral:   8.49 cm/s LVOT diam:     1.80 cm         LV E/e' lateral: 8.8 LV SV:         44 LV SV Index:   26 LVOT Area:     2.54 cm        3D Volume EF                                LV 3D EF:    Left                                             ventricular                                             ejection                                             fraction by                                             3D volume                                             is 60 %.                                 3D Volume EF:  3D EF:        60 %                                LV EDV:       95 ml                                LV ESV:       38 ml                                LV SV:        57 ml RIGHT VENTRICLE             IVC RV Basal diam:  2.70 cm     IVC diam: 1.30 cm RV S prime:     11.30 cm/s TAPSE (M-mode): 1.5 cm LEFT ATRIUM             Index       RIGHT ATRIUM          Index LA diam:        3.60 cm 2.17 cm/m  RA Area:     9.89 cm LA Vol (A2C):   33.0 ml 19.89 ml/m RA Volume:   18.00 ml 10.85 ml/m LA Vol (A4C):   25.0 ml 15.07 ml/m LA Biplane Vol: 29.1 ml 17.54 ml/m  AORTIC VALVE LVOT Vmax:   85.50 cm/s LVOT Vmean:  62.000 cm/s LVOT VTI:    0.171 m  AORTA Ao Root diam: 3.00 cm Ao Asc diam:  2.50 cm MITRAL VALVE               TRICUSPID VALVE MV Area (PHT): 3.72 cm    TR  Peak grad:   15.2 mmHg MV Decel Time: 204 msec    TR Vmax:        195.00 cm/s MV E velocity: 74.40 cm/s MV A velocity: 99.40 cm/s  SHUNTS MV E/A ratio:  0.75        Systemic VTI:  0.17 m                            Systemic Diam: 1.80 cm Gwyndolyn Kaufman MD Electronically signed by Gwyndolyn Kaufman MD Signature Date/Time: 01/31/2021/10:27:15 AM    Final      ELIGIBLE FOR AVAILABLE RESEARCH PROTOCOL: no  ASSESSMENT: 55 y.o. [place] woman status post right breast lower inner quadrant biopsy 01/06/2021 for a clinically T2 N1, stage IIIB functionally triple negative invasive ductal carcinoma, grade 3, with and MIB-1 of 90%  (a) chest CT scan 01/29/2021 shows no evidence of metastatic disease  (b) bone scan 02/01/2021 shows no evidence of metastatic disease  (1) neoadjuvant chemotherapy will consist of pembrolizumab given day 1 together with carboplatin and paclitaxel, with Taxol given on day 8 and 15, and fill gastrium on days 16,17 and 18, repeated every 21 days x 4, followed by doxorubicin and cyclophosphamide every 21 days x 4 also given with pembrolizumab on day 1  (a) echo 01/31/2021 shows an ejection fraction in the 6065% range.  (2) definitive surgery to follow  (3) adjuvant radiation  (4) genetics testing   PLAN: MM did meet with our chemotherapy teaching nurse and did remember some of the things she was taught.  However she had not yet bought her supportive medications and did not know when or how to take them.  We spent well over an hour today going over this material which I gave her in writing in [place] and which she noted in Arabic with the interpreter's help.  I am hopeful this will make it possible for her to undergo treatment without major complications but I remain concerned that despite our and her best efforts there may be problems.  I asked her to be sure to call us directly with any issues or questions and I gave her my nurses number as well as the front number to call so  that she has someone she can call during the day or night.  I gave her a copy of her pathology as well as her CT scan and bone scan results and interpreted those for her.  I showed her the images of the MRI and she took a photo for her own records.  She understands that she will be here 02/07/2021 for chemo and then will take the medicines as prescribed the next 3days.  She will then return on 4/26 for Taxol only and we will make sure she is seen that day to review possible complications from the first day treatment and to make sure all her treatments and visits are in place.  Total encounter time 55 minutes.Sarajane Jews C. Angie Hogg, MD 02/13/2021 11:07 AM Medical Oncology and Hematology [facility], [place] [address] Tel. [phone]    Fax. [phone]   This document serves as a record of services personally performed by Lurline Del, MD. It was created on his behalf by Wilburn Mylar, a trained medical scribe. The creation of this record is based on the scribe's personal observations and the provider's statements to them.   I, Lurline Del MD, have reviewed the above documentation for accuracy and completeness, and I agree with the above.   *Total Encounter Time as defined by the Centers for Medicare and Medicaid Services includes, in addition to the face-to-face time of a patient visit (documented in the note above) non-face-to-face time: obtaining and reviewing outside history, ordering and reviewing medications, tests or procedures, care coordination (communications with other health care professionals or caregivers) and documentation in the medical record.

## 2021-02-14 ENCOUNTER — Encounter: Payer: Self-pay | Admitting: *Deleted

## 2021-02-14 ENCOUNTER — Encounter: Payer: Self-pay | Admitting: Licensed Clinical Social Worker

## 2021-02-14 ENCOUNTER — Inpatient Hospital Stay (HOSPITAL_BASED_OUTPATIENT_CLINIC_OR_DEPARTMENT_OTHER): Payer: 59 | Admitting: Medical

## 2021-02-14 ENCOUNTER — Other Ambulatory Visit: Payer: 59

## 2021-02-14 ENCOUNTER — Inpatient Hospital Stay: Payer: 59

## 2021-02-14 ENCOUNTER — Other Ambulatory Visit: Payer: Self-pay

## 2021-02-14 VITALS — BP 112/73 | HR 96 | Temp 98.1°F | Resp 14 | Ht 63.0 in | Wt 136.5 lb

## 2021-02-14 DIAGNOSIS — C50311 Malignant neoplasm of lower-inner quadrant of right female breast: Secondary | ICD-10-CM

## 2021-02-14 DIAGNOSIS — Z170 Estrogen receptor positive status [ER+]: Secondary | ICD-10-CM

## 2021-02-14 DIAGNOSIS — Z95828 Presence of other vascular implants and grafts: Secondary | ICD-10-CM

## 2021-02-14 DIAGNOSIS — Z5111 Encounter for antineoplastic chemotherapy: Secondary | ICD-10-CM | POA: Diagnosis not present

## 2021-02-14 LAB — CBC WITH DIFFERENTIAL (CANCER CENTER ONLY)
Abs Immature Granulocytes: 0.01 10*3/uL (ref 0.00–0.07)
Basophils Absolute: 0 10*3/uL (ref 0.0–0.1)
Basophils Relative: 1 %
Eosinophils Absolute: 0.2 10*3/uL (ref 0.0–0.5)
Eosinophils Relative: 5 %
HCT: 37.5 % (ref 36.0–46.0)
Hemoglobin: 11.8 g/dL — ABNORMAL LOW (ref 12.0–15.0)
Immature Granulocytes: 0 %
Lymphocytes Relative: 50 %
Lymphs Abs: 2.2 10*3/uL (ref 0.7–4.0)
MCH: 25.7 pg — ABNORMAL LOW (ref 26.0–34.0)
MCHC: 31.5 g/dL (ref 30.0–36.0)
MCV: 81.7 fL (ref 80.0–100.0)
Monocytes Absolute: 0.2 10*3/uL (ref 0.1–1.0)
Monocytes Relative: 4 %
Neutro Abs: 1.7 10*3/uL (ref 1.7–7.7)
Neutrophils Relative %: 40 %
Platelet Count: 255 10*3/uL (ref 150–400)
RBC: 4.59 MIL/uL (ref 3.87–5.11)
RDW: 12.9 % (ref 11.5–15.5)
WBC Count: 4.3 10*3/uL (ref 4.0–10.5)
nRBC: 0 % (ref 0.0–0.2)

## 2021-02-14 LAB — CMP (CANCER CENTER ONLY)
ALT: 12 U/L (ref 0–44)
AST: 13 U/L — ABNORMAL LOW (ref 15–41)
Albumin: 3.8 g/dL (ref 3.5–5.0)
Alkaline Phosphatase: 54 U/L (ref 38–126)
Anion gap: 12 (ref 5–15)
BUN: 14 mg/dL (ref 6–20)
CO2: 24 mmol/L (ref 22–32)
Calcium: 8.9 mg/dL (ref 8.9–10.3)
Chloride: 100 mmol/L (ref 98–111)
Creatinine: 0.61 mg/dL (ref 0.44–1.00)
GFR, Estimated: >60 mL/min (ref >60)
Glucose, Bld: 137 mg/dL — ABNORMAL HIGH (ref 70–99)
Potassium: 3.8 mmol/L (ref 3.5–5.1)
Sodium: 136 mmol/L (ref 135–145)
Total Bilirubin: 0.5 mg/dL (ref 0.3–1.2)
Total Protein: 7.1 g/dL (ref 6.5–8.1)

## 2021-02-14 LAB — TSH: TSH: 1.064 u[IU]/mL (ref 0.308–3.960)

## 2021-02-14 MED ORDER — SODIUM CHLORIDE 0.9% FLUSH
10.0000 mL | Freq: Once | INTRAVENOUS | Status: AC
  Administered 2021-02-14: 10 mL
  Filled 2021-02-14: qty 10

## 2021-02-14 MED ORDER — SODIUM CHLORIDE 0.9 % IV SOLN
80.0000 mg/m2 | Freq: Once | INTRAVENOUS | Status: AC
  Administered 2021-02-14: 132 mg via INTRAVENOUS
  Filled 2021-02-14: qty 22

## 2021-02-14 MED ORDER — LORAZEPAM 0.5 MG PO TABS: 0.5000 mg | ORAL_TABLET | Freq: Two times a day (BID) | ORAL | 0 refills | Status: DC | PRN

## 2021-02-14 MED ORDER — SODIUM CHLORIDE 0.9 % IV SOLN
10.0000 mg | Freq: Once | INTRAVENOUS | Status: AC
  Administered 2021-02-14: 10 mg via INTRAVENOUS
  Filled 2021-02-14: qty 10

## 2021-02-14 MED ORDER — SODIUM CHLORIDE 0.9 % IV SOLN
Freq: Once | INTRAVENOUS | Status: AC
  Filled 2021-02-14: qty 250

## 2021-02-14 MED ORDER — DIPHENHYDRAMINE HCL 50 MG/ML IJ SOLN
INTRAMUSCULAR | Status: AC
  Filled 2021-02-14: qty 1

## 2021-02-14 MED ORDER — FAMOTIDINE IN NACL 20-0.9 MG/50ML-% IV SOLN
20.0000 mg | Freq: Once | INTRAVENOUS | Status: AC
  Administered 2021-02-14: 20 mg via INTRAVENOUS

## 2021-02-14 MED ORDER — DIPHENHYDRAMINE HCL 50 MG/ML IJ SOLN
25.0000 mg | Freq: Once | INTRAMUSCULAR | Status: AC
Start: 2021-02-14 — End: 2021-02-14
  Administered 2021-02-14: 25 mg via INTRAVENOUS

## 2021-02-14 MED ORDER — FAMOTIDINE IN NACL 20-0.9 MG/50ML-% IV SOLN
INTRAVENOUS | Status: AC
  Filled 2021-02-14: qty 50

## 2021-02-14 MED ORDER — ANTICOAGULANT SODIUM CITRATE 4% (200MG/5ML) IV SOLN
5.0000 mL | Freq: Once | Status: AC
Start: 2021-02-14 — End: 2021-02-14
  Administered 2021-02-14: 5 mL
  Filled 2021-02-14: qty 5

## 2021-02-14 MED ORDER — SODIUM CHLORIDE 0.9% FLUSH
10.0000 mL | INTRAVENOUS | Status: DC | PRN
  Administered 2021-02-14: 10 mL
  Filled 2021-02-14: qty 10

## 2021-02-14 NOTE — Progress Notes (Signed)
[facility] Progress Note   Rebecca [name] [id number] 05-03-1966 55 y.o.  Rebecca [name] is managed by Dr. Jana Hakim  Actively treated with chemotherapy/immunotherapy/hormonal therapy: yes  Current therapy: Carboplatin, paclitaxel, and Keytruda  Last treated: 02/07/2021 (cycle 1, day 1)  Next scheduled appointment with provider: 02/21/2021  Assessment: Plan:    Malignant neoplasm of lower-inner quadrant of right breast of female, estrogen receptor positive ([place])   ER positive malignant neoplasm of the right breast: The patient presents to the clinic today for consideration of cycle 1, day 8 of carboplatin, paclitaxel, and Keytruda.  We will proceed with her treatment today and have her return on 02/21/2021 for follow-up.  She inquires regarding disability forms for completion.  Please see After Visit Summary for patient specific instructions.  Future Appointments  Date Time Provider [place]  02/21/2021  8:45 AM [facility] [place] FLUSH [facility] None  02/21/2021  9:30 AM [facility] INFUSION [facility] None  02/22/2021  3:30 PM [facility] [place] FLUSH [facility] None  02/23/2021  3:30 PM [facility] [place] FLUSH [facility] None  02/24/2021  3:30 PM [facility] [place] FLUSH [facility] None  02/28/2021  8:30 AM Magrinat, Virgie Dad, MD [facility] None  02/28/2021 10:00 AM [facility] INFUSION [facility] None  02/28/2021 11:00 AM [facility] INFUSION [facility] None  03/07/2021  9:45 AM [facility] INFUSION [facility] None  03/07/2021 10:00 AM Alla Feeling, NP [facility] None  03/07/2021 10:45 AM [facility] INFUSION [facility] None  03/14/2021 10:15 AM [facility] [place] FLUSH [facility] None  03/14/2021 10:45 AM Magrinat, Virgie Dad, MD [facility] None  03/14/2021 11:15 AM [facility] INFUSION [facility] None  03/21/2021 10:00 AM [facility] INFUSION [facility] None  03/21/2021 11:00 AM [facility] INFUSION [facility] None    No orders of the defined types were placed in  this encounter.      Subjective:   Patient ID:  Rebecca [name] is a 55 y.o. (DOB 02/04/1966) female.  Chief Complaint: No chief complaint on file.   HPI Hani A Kelliher  is a 55 y.o. female with a diagnosis of an ER positive malignant neoplasm of the right breast.  She is managed by Dr. Jana Hakim and presents to the clinic today for consideration of cycle 1, day 8 of carboplatin, paclitaxel, and Keytruda.  She reports that she is doing well overall.  She denies fevers, chills, sweats, nausea, vomiting, constipation, or diarrhea.  Appetite is good.  She was seen today with an interpreter. She inquires regarding disability forms for completion.   Medications: I have reviewed the patient's current medications.  Allergies:  Allergies  Allergen Reactions  . Lisinopril Swelling    Swelling in throat, coughing, difficulty swallowing, could not sleep. Occurred Dec 2020.    Past Medical History:  Diagnosis Date  . Arthritis   . Breast cancer ([place])   . Diabetes mellitus   . Family history of lymphoma 01/18/2021  . Family history of throat cancer 01/18/2021  . Hyperlipidemia   . Hypertension     Past Surgical History:  Procedure Laterality Date  . CESAREAN SECTION    . PORTACATH PLACEMENT N/A 01/26/2021   Procedure: INSERTION PORT-A-CATH;  Surgeon: Erroll Luna, MD;  Location: [facility] OR;  Service: General;  Laterality: N/A;  55    Family History  Problem Relation Age of Onset  . Diabetes Mother   . Heart disease Mother   . Diabetes Father   . Throat cancer Father        d. 51s  . Diabetes Sister   . Diabetes  Brother   . Lymphoma Cousin        maternal cousin; d. 79s    Social History   Socioeconomic History  . Marital status: Married    Spouse name: Not on file  . Number of children: Not on file  . Years of education: Not on file  . Highest education level: Not on file  Occupational History  . Not on file  Tobacco Use  . Smoking status: Passive Smoke  Exposure - Never Smoker  . Smokeless tobacco: Never Used  Substance and Sexual Activity  . Alcohol use: No  . Drug use: No  . Sexual activity: Yes  Other Topics Concern  . Not on file  Social History Narrative  . Not on file   Social Determinants of Health   Financial Resource Strain: Not on file  Food Insecurity: Not on file  Transportation Needs: No Transportation Needs  . Lack of Transportation (Medical): No  . Lack of Transportation (Non-Medical): No  Physical Activity: Not on file  Stress: Not on file  Social Connections: Not on file  Intimate Partner Violence: Not on file    Past Medical History, Surgical history, Social history, and Family history were reviewed and updated as appropriate.   Please see review of systems for further details on the patient's review from today.   Review of Systems:  Review of Systems  Constitutional: Negative for chills, diaphoresis and fever.  HENT: Negative for trouble swallowing and voice change.   Respiratory: Negative for cough, chest tightness, shortness of breath and wheezing.   Cardiovascular: Negative for chest pain and palpitations.  Gastrointestinal: Negative for abdominal pain, constipation, diarrhea, nausea and vomiting.  Musculoskeletal: Negative for back pain and myalgias.  Neurological: Negative for dizziness, light-headedness and headaches.    Objective:   Physical Exam:  BP 112/73 (BP Location: Left Arm, Patient Position: Sitting)   Pulse 96   Temp 98.1 F (36.7 C) (Tympanic)   Resp 14   Ht 5\' 3"  (1.6 m)   Wt 136 lb 8 oz (61.9 kg)   SpO2 100%   BMI 24.18 kg/m  ECOG: 0  Physical Exam Constitutional:      General: She is not in acute distress.    Appearance: She is not diaphoretic.  HENT:     Head: Normocephalic and atraumatic.  Eyes:     General: No scleral icterus.       Right eye: No discharge.        Left eye: No discharge.     Conjunctiva/sclera: Conjunctivae normal.  Cardiovascular:     Rate  and Rhythm: Normal rate and regular rhythm.     Heart sounds: Normal heart sounds. No murmur heard. No friction rub. No gallop.   Pulmonary:     Effort: Pulmonary effort is normal. No respiratory distress.     Breath sounds: Normal breath sounds. No wheezing or rales.  Chest:  Breasts:     Right: Axillary adenopathy present.    Lymphadenopathy:     Upper Body:     Right upper body: Axillary adenopathy present.  Skin:    General: Skin is warm and dry.     Findings: No erythema or rash.  Neurological:     Mental Status: She is alert.     Coordination: Coordination normal.     Gait: Gait normal.  Psychiatric:        Mood and Affect: Mood normal.        Behavior: Behavior normal.  Thought Content: Thought content normal.        Judgment: Judgment normal.     Lab Review:     Component Value Date/Time   NA 136 02/14/2021 0849   NA 140 12/27/2020 1123   K 3.8 02/14/2021 0849   CL 100 02/14/2021 0849   CO2 24 02/14/2021 0849   GLUCOSE 137 (H) 02/14/2021 0849   BUN 14 02/14/2021 0849   BUN 11 12/27/2020 1123   CREATININE 0.61 02/14/2021 0849   CREATININE 0.50 11/02/2016 1102   CALCIUM 8.9 02/14/2021 0849   PROT 7.1 02/14/2021 0849   PROT 7.5 12/27/2020 1123   ALBUMIN 3.8 02/14/2021 0849   ALBUMIN 4.7 12/27/2020 1123   AST 13 (L) 02/14/2021 0849   ALT 12 02/14/2021 0849   ALKPHOS 54 02/14/2021 0849   BILITOT 0.5 02/14/2021 0849   GFRNONAA >60 02/14/2021 0849   GFRNONAA >89 01/16/2016 1006   GFRAA 118 06/02/2020 0942   GFRAA >89 01/16/2016 1006       Component Value Date/Time   WBC 4.3 02/14/2021 0849   WBC 6.7 01/26/2021 0824   RBC 4.59 02/14/2021 0849   HGB 11.8 (L) 02/14/2021 0849   HCT 37.5 02/14/2021 0849   PLT 255 02/14/2021 0849   MCV 81.7 02/14/2021 0849   MCH 25.7 (L) 02/14/2021 0849   MCHC 31.5 02/14/2021 0849   RDW 12.9 02/14/2021 0849   LYMPHSABS 2.2 02/14/2021 0849   MONOABS 0.2 02/14/2021 0849   EOSABS 0.2 02/14/2021 0849   BASOSABS 0.0  02/14/2021 0849   -------------------------------  Imaging from last 24 hours (if applicable):  Radiology interpretation: CT Chest W Contrast  Result Date: 01/29/2021 CLINICAL DATA:  New diagnosis of right breast cancer. Staging evaluation. EXAM: CT CHEST WITH CONTRAST TECHNIQUE: Multidetector CT imaging of the chest was performed during intravenous contrast administration. CONTRAST:  30mL OMNIPAQUE IOHEXOL 300 MG/ML  SOLN COMPARISON:  Chest radiograph from one day prior. FINDINGS: Cardiovascular: Normal heart size. No significant pericardial effusion/thickening. Right internal jugular Port-A-Cath terminates in the lower third of the SVC. Expected mild subcutaneous emphysema surrounding the port in the supraclavicular right neck compatible with recent port placement. Normal course and caliber of the thoracic aorta. Dilated main pulmonary artery (3.5 cm diameter). No central pulmonary emboli. Mediastinum/Nodes: No discrete thyroid nodules. Unremarkable esophagus. Enlarged 2.4 cm short axis diameter right axillary node with internal biopsy clip (series 2/image 43). No additional pathologically enlarged axillary nodes. No pathologically enlarged mediastinal or hilar nodes. Lungs/Pleura: No pneumothorax. No pleural effusion. No acute consolidative airspace disease, lung masses or significant pulmonary nodules. Mild patchy subpleural ground-glass opacity and reticulation at both lung apices, right greater than left. Mild hypoventilatory changes in the dependent lower lobes. Upper abdomen: No acute abnormality. Musculoskeletal:  No aggressive appearing focal osseous lesions. IMPRESSION: 1. Moderately enlarged right axillary lymph node with internal biopsy clip, compatible with right axillary nodal metastasis. 2. No additional sites of metastatic disease in the chest. 3. Mild patchy subpleural ground-glass opacity and reticulation at both lung apices, right greater than left, nonspecific, favor postinfectious  scarring. 4. Dilated main pulmonary artery, suggesting pulmonary arterial hypertension. Electronically Signed   By: Ilona Sorrel M.D.   On: 01/29/2021 19:51   NM Bone Scan Whole Body  Result Date: 02/02/2021 CLINICAL DATA:  Newly diagnosed breast cancer EXAM: NUCLEAR MEDICINE WHOLE BODY BONE SCAN TECHNIQUE: Whole body anterior and posterior images were obtained approximately 3 hours after intravenous injection of radiopharmaceutical. RADIOPHARMACEUTICALS:  21.4 mCi Technetium-59m MDP IV COMPARISON:  CT chest 01/27/2021 FINDINGS: Physiologic distribution of radiotracer with renal uptake and excretion. No focal abnormal radiotracer accumulation within the axial or appendicular skeleton to suggest metastatic disease. IMPRESSION: No scintigraphic evidence of osseous metastatic disease. Electronically Signed   By: Davina Poke D.O.   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 LABS:  Not performed EXAM: BILATERAL BREAST MRI WITH AND WITHOUT CONTRAST TECHNIQUE: Multiplanar, multisequence MR images of both breasts were obtained prior to and following the intravenous administration of 6 ml of Gadavist Three-dimensional MR images were rendered by post-processing of the original MR data on an independent workstation. The three-dimensional MR images were interpreted, and findings are reported in the following complete MRI report for this study. Three dimensional images were evaluated at the independent interpreting workstation using the DynaCAD thin client. COMPARISON:  Prior mammograms and ultrasounds FINDINGS: Breast composition: b. Scattered fibroglandular tissue. Background parenchymal enhancement: Minimal Right breast: Two adjacent 2 cm masses are identified within the LOWER RIGHT breast, middle to posterior depth with biopsy clip  noted within the anterior mass, compatible with biopsy-proven malignancy. The 2 masses together measure 2.1 x 2.3 x 1.5 cm (transverse x AP x CC) No other suspicious mass or enhancement identified within the RIGHT breast. Left breast: No mass or abnormal enhancement. Lymph nodes: A single abnormal enlarged RIGHT axillary lymph node is noted containing biopsy clip artifact and compatible with metastasis involving a RIGHT axillary lymph node. No other abnormal lymph nodes are identified. Ancillary findings:  None. IMPRESSION: 1. Two adjacent LOWER RIGHT breast masses, spanning an area measuring 2.1 x 2.3 x 1.5 cm, compatible with biopsy-proven malignancy. No other suspicious RIGHT breast abnormalities noted. 2. Single enlarged RIGHT axillary lymph node compatible with biopsy-proven metastasis. 3. No evidence of LEFT breast malignancy. RECOMMENDATION: Treatment plan BI-RADS CATEGORY  6: Known biopsy-proven malignancy. Electronically Signed   By: Margarette Canada M.D.   On: 01/30/2021 12:08   DG Chest Port 1 View  Result Date: 01/26/2021 CLINICAL DATA:  Status post Port-A-Cath insertion. EXAM: PORTABLE CHEST 1 VIEW COMPARISON:  11/21/2019. FINDINGS: Right IJ approach Port-A-Cath with the tip projecting at the superior right cavoatrial junction. No visible kink in the catheter. No visible pneumothorax or pleural effusions. No consolidation. Cardiomediastinal silhouette is within normal limits and similar to prior. IMPRESSION: 1. Right IJ approach Port-A-Cath with the tip projecting at the superior right cavoatrial junction. 2. No acute cardiopulmonary disease. No visible pneumothorax. Electronically Signed   By: Margaretha Sheffield MD   On: 01/26/2021 13:41   DG Fluoro Guide CV Line-No Report  Result Date: 01/26/2021 Fluoroscopy was utilized by the requesting physician.  No radiographic interpretation.   ECHOCARDIOGRAM COMPLETE  Result Date: 01/31/2021    ECHOCARDIOGRAM REPORT   Patient Name:   AMIRRA HERLING  Date of Exam: 01/31/2021 Medical Rec #:  [id number]            Height:       63.0 in Accession #:    [id number]           Weight:       139.6 lb Date of Birth:  1966-05-23            BSA:          1.659 m Patient Age:    64 years  BP:           115/75 mmHg Patient Gender: F                    HR:           79 bpm. Exam Location:  Outpatient Procedure: 2D Echo, 3D Echo, Cardiac Doppler, Color Doppler and Strain Analysis Indications:    Z51.11 Encounter for antineoplastic chemotheraphy  History:        Patient has no prior history of Echocardiogram examinations.                 Risk Factors:Hypertension, Diabetes and Dyslipidemia. Breast                 Cancer.  Sonographer:    Jonelle Sidle Dance Referring Phys: [place]  1. Left ventricular ejection fraction, by estimation, is 60 to 65%. Left ventricular ejection fraction by 3D volume is 60 %. The left ventricle has normal function. The left ventricle has no regional wall motion abnormalities. There is mild asymmetric left ventricular hypertrophy of the basal-septal segment. Left ventricular diastolic parameters are consistent with Grade I diastolic dysfunction (impaired relaxation).  2. Right ventricular systolic function is normal. The right ventricular size is normal. There is normal pulmonary artery systolic pressure.  3. The mitral valve is normal in structure. Trivial mitral valve regurgitation.  4. The aortic valve is tricuspid. Aortic valve regurgitation is not visualized. No aortic stenosis is present.  5. The inferior vena cava is normal in size with greater than 50% respiratory variability, suggesting right atrial pressure of 3 mmHg. Comparison(s): No prior Echocardiogram. FINDINGS  Left Ventricle: Left ventricular ejection fraction, by estimation, is 60 to 65%. Left ventricular ejection fraction by 3D volume is 60 %. The left ventricle has normal function. The left ventricle has no regional wall motion abnormalities. Global  longitudinal strain performed but not reported based on interpreter judgement due to suboptimal tracking. The left ventricular internal cavity size was normal in size. There is mild asymmetric left ventricular hypertrophy of the basal-septal segment. Left ventricular diastolic parameters are consistent with Grade I diastolic dysfunction (impaired relaxation). Right Ventricle: The right ventricular size is normal. No increase in right ventricular wall thickness. Right ventricular systolic function is normal. There is normal pulmonary artery systolic pressure. The tricuspid regurgitant velocity is 1.95 m/s, and  with an assumed right atrial pressure of 3 mmHg, the estimated right ventricular systolic pressure is 123XX123 mmHg. Left Atrium: Left atrial size was normal in size. Right Atrium: Right atrial size was normal in size. Pericardium: There is no evidence of pericardial effusion. Mitral Valve: The mitral valve is normal in structure. There is mild thickening of the mitral valve leaflet(s). Trivial mitral valve regurgitation. Tricuspid Valve: The tricuspid valve is normal in structure. Tricuspid valve regurgitation is trivial. Aortic Valve: The aortic valve is tricuspid. Aortic valve regurgitation is not visualized. No aortic stenosis is present. Pulmonic Valve: The pulmonic valve was normal in structure. Pulmonic valve regurgitation is not visualized. Aorta: The aortic root and ascending aorta are structurally normal, with no evidence of dilitation. Venous: The inferior vena cava is normal in size with greater than 50% respiratory variability, suggesting right atrial pressure of 3 mmHg. IAS/Shunts: The interatrial septum is aneurysmal. No atrial level shunt detected by color flow Doppler.  LEFT VENTRICLE PLAX 2D LVIDd:         3.90 cm         Diastology LVIDs:  2.50 cm         LV e' medial:    5.98 cm/s LV PW:         1.10 cm         LV E/e' medial:  12.4 LV IVS:        0.90 cm         LV e' lateral:   8.49  cm/s LVOT diam:     1.80 cm         LV E/e' lateral: 8.8 LV SV:         44 LV SV Index:   26 LVOT Area:     2.54 cm        3D Volume EF                                LV 3D EF:    Left                                             ventricular                                             ejection                                             fraction by                                             3D volume                                             is 60 %.                                 3D Volume EF:                                3D EF:        60 %                                LV EDV:       95 ml                                LV ESV:       38 ml                                LV SV:        57 ml RIGHT VENTRICLE  IVC RV Basal diam:  2.70 cm     IVC diam: 1.30 cm RV S prime:     11.30 cm/s TAPSE (M-mode): 1.5 cm LEFT ATRIUM             Index       RIGHT ATRIUM          Index LA diam:        3.60 cm 2.17 cm/m  RA Area:     9.89 cm LA Vol (A2C):   33.0 ml 19.89 ml/m RA Volume:   18.00 ml 10.85 ml/m LA Vol (A4C):   25.0 ml 15.07 ml/m LA Biplane Vol: 29.1 ml 17.54 ml/m  AORTIC VALVE LVOT Vmax:   85.50 cm/s LVOT Vmean:  62.000 cm/s LVOT VTI:    0.171 m  AORTA Ao Root diam: 3.00 cm Ao Asc diam:  2.50 cm MITRAL VALVE               TRICUSPID VALVE MV Area (PHT): 3.72 cm    TR Peak grad:   15.2 mmHg MV Decel Time: 204 msec    TR Vmax:        195.00 cm/s MV E velocity: 74.40 cm/s MV A velocity: 99.40 cm/s  SHUNTS MV E/A ratio:  0.75        Systemic VTI:  0.17 m                            Systemic Diam: 1.80 cm Gwyndolyn Kaufman MD Electronically signed by Gwyndolyn Kaufman MD Signature Date/Time: 01/31/2021/10:27:15 AM    Final       Okay to treat pending chemistry results.  Sandi Mealy, MHS, PA-C Physician Assistant

## 2021-02-14 NOTE — Patient Instructions (Signed)
[facility]  Discharge Instructions: Thank you for choosing [facility] to provide your oncology and hematology care.   If you have a lab appointment with the [facility], please go directly to the [facility] and check in at the registration area.   Wear comfortable clothing and clothing appropriate for easy access to any Portacath or PICC line.   We strive to give you quality time with your provider. You may need to reschedule your appointment if you arrive late (15 or more minutes).  Arriving late affects you and other patients whose appointments are after yours.  Also, if you miss three or more appointments without notifying the office, you may be dismissed from the clinic at the provider's discretion.      For prescription refill requests, have your pharmacy contact our office and allow 72 hours for refills to be completed.    Today you received the following chemotherapy and/or immunotherapy agents Paclitaxel injection What is this medicine? PACLITAXEL (PAK li TAX el) is a chemotherapy drug. It targets fast dividing cells, like cancer cells, and causes these cells to die. This medicine is used to treat ovarian cancer, breast cancer, lung cancer, Kaposi's sarcoma, and other cancers. This medicine may be used for other purposes; ask your health care provider or pharmacist if you have questions. COMMON BRAND NAME(S): Onxol, Taxol What should I tell my health care provider before I take this medicine? They need to know if you have any of these conditions:  history of irregular heartbeat  liver disease  low blood counts, like low white cell, platelet, or red cell counts  lung or breathing disease, like asthma  tingling of the fingers or toes, or other nerve disorder  an unusual or allergic reaction to paclitaxel, alcohol, polyoxyethylated castor oil, other chemotherapy, other medicines, foods, dyes, or preservatives  pregnant or trying  to get pregnant  breast-feeding How should I use this medicine? This drug is given as an infusion into a vein. It is administered in a hospital or clinic by a specially trained health care professional. Talk to your pediatrician regarding the use of this medicine in children. Special care may be needed. Overdosage: If you think you have taken too much of this medicine contact a poison control center or emergency room at once. NOTE: This medicine is only for you. Do not share this medicine with others. What if I miss a dose? It is important not to miss your dose. Call your doctor or health care professional if you are unable to keep an appointment. What may interact with this medicine? Do not take this medicine with any of the following medications:  live virus vaccines This medicine may also interact with the following medications:  antiviral medicines for hepatitis, HIV or AIDS  certain antibiotics like erythromycin and clarithromycin  certain medicines for fungal infections like ketoconazole and itraconazole  certain medicines for seizures like carbamazepine, phenobarbital, phenytoin  gemfibrozil  nefazodone  rifampin  St. John's wort This list may not describe all possible interactions. Give your health care provider a list of all the medicines, herbs, non-prescription drugs, or dietary supplements you use. Also tell them if you smoke, drink alcohol, or use illegal drugs. Some items may interact with your medicine. What should I watch for while using this medicine? Your condition will be monitored carefully while you are receiving this medicine. You will need important blood work done while you are taking this medicine. This medicine can  cause serious allergic reactions. To reduce your risk you will need to take other medicine(s) before treatment with this medicine. If you experience allergic reactions like skin rash, itching or hives, swelling of the face, lips, or tongue, tell  your doctor or health care professional right away. In some cases, you may be given additional medicines to help with side effects. Follow all directions for their use. This drug may make you feel generally unwell. This is not uncommon, as chemotherapy can affect healthy cells as well as cancer cells. Report any side effects. Continue your course of treatment even though you feel ill unless your doctor tells you to stop. Call your doctor or health care professional for advice if you get a fever, chills or sore throat, or other symptoms of a cold or flu. Do not treat yourself. This drug decreases your body's ability to fight infections. Try to avoid being around people who are sick. This medicine may increase your risk to bruise or bleed. Call your doctor or health care professional if you notice any unusual bleeding. Be careful brushing and flossing your teeth or using a toothpick because you may get an infection or bleed more easily. If you have any dental work done, tell your dentist you are receiving this medicine. Avoid taking products that contain aspirin, acetaminophen, ibuprofen, naproxen, or ketoprofen unless instructed by your doctor. These medicines may hide a fever. Do not become pregnant while taking this medicine. Women should inform their doctor if they wish to become pregnant or think they might be pregnant. There is a potential for serious side effects to an unborn child. Talk to your health care professional or pharmacist for more information. Do not breast-feed an infant while taking this medicine. Men are advised not to father a child while receiving this medicine. This product may contain alcohol. Ask your pharmacist or healthcare provider if this medicine contains alcohol. Be sure to tell all healthcare providers you are taking this medicine. Certain medicines, like metronidazole and disulfiram, can cause an unpleasant reaction when taken with alcohol. The reaction includes flushing,  headache, nausea, vomiting, sweating, and increased thirst. The reaction can last from 30 minutes to several hours. What side effects may I notice from receiving this medicine? Side effects that you should report to your doctor or health care professional as soon as possible:  allergic reactions like skin rash, itching or hives, swelling of the face, lips, or tongue  breathing problems  changes in vision  fast, irregular heartbeat  high or low blood pressure  mouth sores  pain, tingling, numbness in the hands or feet  signs of decreased platelets or bleeding - bruising, pinpoint red spots on the skin, black, tarry stools, blood in the urine  signs of decreased red blood cells - unusually weak or tired, feeling faint or lightheaded, falls  signs of infection - fever or chills, cough, sore throat, pain or difficulty passing urine  signs and symptoms of liver injury like dark yellow or brown urine; general ill feeling or flu-like symptoms; light-colored stools; loss of appetite; nausea; right upper belly pain; unusually weak or tired; yellowing of the eyes or skin  swelling of the ankles, feet, hands  unusually slow heartbeat Side effects that usually do not require medical attention (report to your doctor or health care professional if they continue or are bothersome):  diarrhea  hair loss  loss of appetite  muscle or joint pain  nausea, vomiting  pain, redness, or irritation at site where  injected  tiredness This list may not describe all possible side effects. Call your doctor for medical advice about side effects. You may report side effects to FDA at 1-800-FDA-1088. Where should I keep my medicine? This drug is given in a hospital or clinic and will not be stored at home. NOTE: This sheet is a summary. It may not cover all possible information. If you have questions about this medicine, talk to your doctor, pharmacist, or health care provider.  2021 Elsevier/Gold  Standard (2019-09-09 13:37:23)    To help prevent nausea and vomiting after your treatment, we encourage you to take your nausea medication as directed.  BELOW ARE SYMPTOMS THAT SHOULD BE REPORTED IMMEDIATELY: . *FEVER GREATER THAN 100.4 F (38 C) OR HIGHER . *CHILLS OR SWEATING . *NAUSEA AND VOMITING THAT IS NOT CONTROLLED WITH YOUR NAUSEA MEDICATION . *UNUSUAL SHORTNESS OF BREATH . *UNUSUAL BRUISING OR BLEEDING . *URINARY PROBLEMS (pain or burning when urinating, or frequent urination) . *BOWEL PROBLEMS (unusual diarrhea, constipation, pain near the anus) . TENDERNESS IN MOUTH AND THROAT WITH OR WITHOUT PRESENCE OF ULCERS (sore throat, sores in mouth, or a toothache) . UNUSUAL RASH, SWELLING OR PAIN  . UNUSUAL VAGINAL DISCHARGE OR ITCHING   Items with * indicate a potential emergency and should be followed up as soon as possible or go to the Emergency Department if any problems should occur.  Please show the CHEMOTHERAPY ALERT CARD or IMMUNOTHERAPY ALERT CARD at check-in to the Emergency Department and triage nurse.  Should you have questions after your visit or need to cancel or reschedule your appointment, please contact [facility]  Dept: [phone]  and follow the prompts.  Office hours are 8:00 a.m. to 4:30 p.m. Monday - Friday. Please note that voicemails left after 4:00 p.m. may not be returned until the following business day.  We are closed weekends and major holidays. You have access to a nurse at all times for urgent questions. Please call the main number to the clinic Dept: [phone] and follow the prompts.   For any non-urgent questions, you may also contact your provider using MyChart. We now offer e-Visits for anyone 35 and older to request care online for non-urgent symptoms. For details visit [email or web address].   Also download the MyChart app! Go to the app store, search "MyChart", open the app, select [facility], and log in with  your MyChart username and password.  Due to Covid, a mask is required upon entering the hospital/clinic. If you do not have a mask, one will be given to you upon arrival. For doctor visits, patients may have 1 support person aged 64 or older with them. For treatment visits, patients cannot have anyone with them due to current Covid guidelines and our immunocompromised population.

## 2021-02-14 NOTE — Progress Notes (Signed)
[facility] CSW Progress Note  Holiday representative met with patient to follow-up on resources. Notified patient that [employer] [place] has been approved to assist with some medical expenses. Patient stated she also received [employer].  CSW signed up patient for [employer] and gave first disbursement today.  Patient asked about her short-term disability paperwork that she stated she gave to her doctor. Informed pt that that paperwork is handled by another staff member and gave pt name/ contact.    Pt then asked about getting a letter from her doctor to give to her apartment complex asking for hard flooring (ie: wood) instead of carpet due to difficulty with cleaning/ maintaining during her treatments. Per pt, apartment leasing office said to get a note from her provider. CSW sent message to doctor today with request.    Christeen Douglas , LCSW

## 2021-02-15 ENCOUNTER — Encounter: Payer: Self-pay | Admitting: Oncology

## 2021-02-15 ENCOUNTER — Other Ambulatory Visit: Payer: Self-pay | Admitting: Oncology

## 2021-02-15 ENCOUNTER — Other Ambulatory Visit: Payer: Self-pay | Admitting: Family Medicine

## 2021-02-15 DIAGNOSIS — E119 Type 2 diabetes mellitus without complications: Secondary | ICD-10-CM

## 2021-02-15 DIAGNOSIS — Z170 Estrogen receptor positive status [ER+]: Secondary | ICD-10-CM

## 2021-02-15 DIAGNOSIS — C50311 Malignant neoplasm of lower-inner quadrant of right female breast: Secondary | ICD-10-CM

## 2021-02-15 NOTE — Telephone Encounter (Signed)
Requested Prescriptions  Pending Prescriptions Disp Refills  . metFORMIN (GLUCOPHAGE) 500 MG tablet [Pharmacy Med Name: METFORMIN 500MG TABLETS] 180 tablet 0    Sig: TAKE 1 TABLET BY MOUTH TWICE DAILY     Endocrinology:  Diabetes - Biguanides Passed - 02/15/2021  3:35 AM      Passed - Cr in normal range and within 360 days    Creatinine  Date Value Ref Range Status  02/14/2021 0.61 0.44 - 1.00 mg/dL Final   Creat  Date Value Ref Range Status  11/02/2016 0.50 0.50 - 1.10 mg/dL Final   Creatinine, Urine  Date Value Ref Range Status  11/02/2016 141 20 - 320 mg/dL Final         Passed - HBA1C is between 0 and 7.9 and within 180 days    HbA1c, POC (controlled diabetic range)  Date Value Ref Range Status  11/04/2019 7.9 (A) 0.0 - 7.0 % Final   Hgb A1c MFr Bld  Date Value Ref Range Status  11/11/2020 7.1 (H) 4.8 - 5.6 % Final    Comment:             Prediabetes: 5.7 - 6.4          Diabetes: >6.4          Glycemic control for adults with diabetes: <7.0          Passed - eGFR in normal range and within 360 days    GFR, Est African American  Date Value Ref Range Status  01/16/2016 >89 >=60 mL/min Final   GFR calc Af Amer  Date Value Ref Range Status  06/02/2020 118 >59 mL/min/1.73 Final    Comment:    **[facility] currently reports eGFR in compliance with the current**   recommendations of the [employer]. [facility] will   update reporting as new guidelines are published from the NKF-ASN   Task force.    GFR, Est Non African American  Date Value Ref Range Status  01/16/2016 >89 >=60 mL/min Final    Comment:      The estimated GFR is a calculation valid for adults (>=45 years old) that uses the CKD-EPI algorithm to adjust for age and sex. It is   not to be used for children, pregnant women, hospitalized patients,    patients on dialysis, or with rapidly changing kidney function. According to the NKDEP, eGFR >89 is normal, 60-89 shows mild impairment, 30-59  shows moderate impairment, 15-29 shows severe impairment and <15 is ESRD.      GFR, Estimated  Date Value Ref Range Status  02/14/2021 >60 >60 mL/min Final    Comment:    (NOTE) Calculated using the CKD-EPI Creatinine Equation (2021)    eGFR  Date Value Ref Range Status  12/27/2020 111 >59 mL/min/1.73 Final         Passed - Valid encounter within last 6 months    Recent Outpatient Visits          1 month ago Annual physical exam   [facility], Mitchell, MD   3 months ago Vasomotor symptoms due to menopause   [facility], [name], MD   7 months ago Type 2 diabetes mellitus without complication, without long-term current use of insulin [facility])   [facility], Annie Main L, RPH-CPP   8 months ago Type 2 diabetes mellitus without complication, without long-term current use of insulin ([facility] [address])   [facility], Jarome Matin, RPH-CPP   8 months ago Myalgia   [facility] [name], MD

## 2021-02-20 ENCOUNTER — Other Ambulatory Visit: Payer: Self-pay

## 2021-02-20 ENCOUNTER — Encounter: Payer: Self-pay | Admitting: Oncology

## 2021-02-20 DIAGNOSIS — Z170 Estrogen receptor positive status [ER+]: Secondary | ICD-10-CM

## 2021-02-20 DIAGNOSIS — C50311 Malignant neoplasm of lower-inner quadrant of right female breast: Secondary | ICD-10-CM

## 2021-02-20 NOTE — Progress Notes (Signed)
Received approval letter for [facility] for [employer] copay assistance.  Patient approved for maximum benefit of $25,000 11/18/20- 10/21/21 leaving her with a $25 copay per treatment after insurance has paid their portion.  Patient will receive a copy of the approval letter in the mail for her records only.  A copy given to [facility] for billing/copay submissions.

## 2021-02-21 ENCOUNTER — Other Ambulatory Visit: Payer: 59

## 2021-02-21 ENCOUNTER — Encounter: Payer: Self-pay | Admitting: Licensed Clinical Social Worker

## 2021-02-21 ENCOUNTER — Inpatient Hospital Stay: Payer: 59

## 2021-02-21 ENCOUNTER — Inpatient Hospital Stay: Payer: 59 | Attending: Oncology

## 2021-02-21 ENCOUNTER — Other Ambulatory Visit: Payer: Self-pay

## 2021-02-21 VITALS — BP 124/85 | HR 79 | Temp 97.6°F | Resp 16

## 2021-02-21 DIAGNOSIS — Z95828 Presence of other vascular implants and grafts: Secondary | ICD-10-CM

## 2021-02-21 DIAGNOSIS — Z170 Estrogen receptor positive status [ER+]: Secondary | ICD-10-CM

## 2021-02-21 DIAGNOSIS — C50311 Malignant neoplasm of lower-inner quadrant of right female breast: Secondary | ICD-10-CM

## 2021-02-21 DIAGNOSIS — Z5111 Encounter for antineoplastic chemotherapy: Secondary | ICD-10-CM | POA: Diagnosis present

## 2021-02-21 DIAGNOSIS — Z171 Estrogen receptor negative status [ER-]: Secondary | ICD-10-CM | POA: Insufficient documentation

## 2021-02-21 DIAGNOSIS — Z79899 Other long term (current) drug therapy: Secondary | ICD-10-CM | POA: Insufficient documentation

## 2021-02-21 DIAGNOSIS — Z5189 Encounter for other specified aftercare: Secondary | ICD-10-CM | POA: Diagnosis not present

## 2021-02-21 DIAGNOSIS — Z5112 Encounter for antineoplastic immunotherapy: Secondary | ICD-10-CM | POA: Insufficient documentation

## 2021-02-21 DIAGNOSIS — C773 Secondary and unspecified malignant neoplasm of axilla and upper limb lymph nodes: Secondary | ICD-10-CM | POA: Insufficient documentation

## 2021-02-21 LAB — CBC WITH DIFFERENTIAL (CANCER CENTER ONLY)
Abs Immature Granulocytes: 0.01 10*3/uL (ref 0.00–0.07)
Basophils Absolute: 0 10*3/uL (ref 0.0–0.1)
Basophils Relative: 0 %
Eosinophils Absolute: 0 10*3/uL (ref 0.0–0.5)
Eosinophils Relative: 1 %
HCT: 38 % (ref 36.0–46.0)
Hemoglobin: 12.2 g/dL (ref 12.0–15.0)
Immature Granulocytes: 0 %
Lymphocytes Relative: 57 %
Lymphs Abs: 2 10*3/uL (ref 0.7–4.0)
MCH: 26.5 pg (ref 26.0–34.0)
MCHC: 32.1 g/dL (ref 30.0–36.0)
MCV: 82.4 fL (ref 80.0–100.0)
Monocytes Absolute: 0.3 10*3/uL (ref 0.1–1.0)
Monocytes Relative: 8 %
Neutro Abs: 1.2 10*3/uL — ABNORMAL LOW (ref 1.7–7.7)
Neutrophils Relative %: 34 %
Platelet Count: 293 10*3/uL (ref 150–400)
RBC: 4.61 MIL/uL (ref 3.87–5.11)
RDW: 13.4 % (ref 11.5–15.5)
WBC Count: 3.5 10*3/uL — ABNORMAL LOW (ref 4.0–10.5)
nRBC: 0 % (ref 0.0–0.2)

## 2021-02-21 LAB — CMP (CANCER CENTER ONLY)
ALT: 12 U/L (ref 0–44)
AST: 12 U/L — ABNORMAL LOW (ref 15–41)
Albumin: 3.9 g/dL (ref 3.5–5.0)
Alkaline Phosphatase: 55 U/L (ref 38–126)
Anion gap: 9 (ref 5–15)
BUN: 16 mg/dL (ref 6–20)
CO2: 29 mmol/L (ref 22–32)
Calcium: 9.3 mg/dL (ref 8.9–10.3)
Chloride: 105 mmol/L (ref 98–111)
Creatinine: 0.61 mg/dL (ref 0.44–1.00)
GFR, Estimated: >60 mL/min (ref >60)
Glucose, Bld: 74 mg/dL (ref 70–99)
Potassium: 3.7 mmol/L (ref 3.5–5.1)
Sodium: 143 mmol/L (ref 135–145)
Total Bilirubin: 0.3 mg/dL (ref 0.3–1.2)
Total Protein: 7.1 g/dL (ref 6.5–8.1)

## 2021-02-21 MED ORDER — FAMOTIDINE 20 MG IN NS 100 ML IVPB
20.0000 mg | Freq: Once | INTRAVENOUS | Status: AC
  Administered 2021-02-21: 20 mg via INTRAVENOUS

## 2021-02-21 MED ORDER — DEXAMETHASONE SODIUM PHOSPHATE 100 MG/10ML IJ SOLN
10.0000 mg | Freq: Once | INTRAMUSCULAR | Status: AC
Start: 2021-02-21 — End: 2021-02-21
  Administered 2021-02-21: 10 mg via INTRAVENOUS
  Filled 2021-02-21: qty 10

## 2021-02-21 MED ORDER — SODIUM CHLORIDE 0.9 % IV SOLN
Freq: Once | INTRAVENOUS | Status: AC
  Filled 2021-02-21: qty 250

## 2021-02-21 MED ORDER — ALTEPLASE 2 MG IJ SOLR
2.0000 mg | Freq: Once | INTRAMUSCULAR | Status: AC
  Administered 2021-02-21: 2 mg
  Filled 2021-02-21: qty 2

## 2021-02-21 MED ORDER — ANTICOAGULANT SODIUM CITRATE 4% (200MG/5ML) IV SOLN
5.0000 mL | Freq: Once | Status: AC
Start: 2021-02-21 — End: 2021-02-21
  Administered 2021-02-21: 5 mL
  Filled 2021-02-21: qty 5

## 2021-02-21 MED ORDER — DIPHENHYDRAMINE HCL 50 MG/ML IJ SOLN
25.0000 mg | Freq: Once | INTRAMUSCULAR | Status: AC
  Administered 2021-02-21: 25 mg via INTRAVENOUS

## 2021-02-21 MED ORDER — COLD PACK MISC ONCOLOGY
1.0000 | Freq: Once | Status: DC | PRN
  Filled 2021-02-21: qty 1

## 2021-02-21 MED ORDER — ALTEPLASE 2 MG IJ SOLR
INTRAMUSCULAR | Status: AC
  Filled 2021-02-21: qty 2

## 2021-02-21 MED ORDER — SODIUM CHLORIDE 0.9 % IV SOLN
80.0000 mg/m2 | Freq: Once | INTRAVENOUS | Status: AC
  Administered 2021-02-21: 132 mg via INTRAVENOUS
  Filled 2021-02-21: qty 22

## 2021-02-21 MED ORDER — FAMOTIDINE 20 MG IN NS 100 ML IVPB
INTRAVENOUS | Status: AC
  Filled 2021-02-21: qty 100

## 2021-02-21 MED ORDER — DIPHENHYDRAMINE HCL 50 MG/ML IJ SOLN
INTRAMUSCULAR | Status: AC
  Filled 2021-02-21: qty 1

## 2021-02-21 MED ORDER — SODIUM CHLORIDE 0.9% FLUSH
10.0000 mL | INTRAVENOUS | Status: DC | PRN
  Administered 2021-02-21: 10 mL
  Filled 2021-02-21: qty 10

## 2021-02-21 MED ORDER — SODIUM CHLORIDE 0.9% FLUSH
10.0000 mL | Freq: Once | INTRAVENOUS | Status: AC
Start: 2021-02-21 — End: 2021-02-21
  Administered 2021-02-21: 10 mL
  Filled 2021-02-21: qty 10

## 2021-02-21 NOTE — Progress Notes (Signed)
[facility] CSW Progress Note  Holiday representative met with patient in infusion.  Provided second set of [employer].  Discussed social security disability and completed referral to [employer] for assistance.  Updated patient that she has been approved for [employer] [place] assistance.  [employer] has received her application but needed additional information. Clarified with patient today and sent updates. Waiting on lease that includes where payments can be sent to complete application.    Christeen Douglas , LCSW

## 2021-02-21 NOTE — Progress Notes (Signed)
Okay to treat with ANC of 1.2 per Dr. Jana Hakim.

## 2021-02-21 NOTE — Patient Instructions (Signed)
[facility]  Discharge Instructions: °Thank you for choosing [facility] to provide your oncology and hematology care.  ° °If you have a lab appointment with the Cancer Center, please go directly to the Cancer Center and check in at the registration area. °  °Wear comfortable clothing and clothing appropriate for easy access to any Portacath or PICC line.  ° °We strive to give you quality time with your provider. You may need to reschedule your appointment if you arrive late (15 or more minutes).  Arriving late affects you and other patients whose appointments are after yours.  Also, if you miss three or more appointments without notifying the office, you may be dismissed from the clinic at the provider’s discretion.    °  °For prescription refill requests, have your pharmacy contact our office and allow 72 hours for refills to be completed.   ° °Today you received the following chemotherapy and/or immunotherapy agents: Taxol   °  °To help prevent nausea and vomiting after your treatment, we encourage you to take your nausea medication as directed. ° °BELOW ARE SYMPTOMS THAT SHOULD BE REPORTED IMMEDIATELY: °*FEVER GREATER THAN 100.4 F (38 °C) OR HIGHER °*CHILLS OR SWEATING °*NAUSEA AND VOMITING THAT IS NOT CONTROLLED WITH YOUR NAUSEA MEDICATION °*UNUSUAL SHORTNESS OF BREATH °*UNUSUAL BRUISING OR BLEEDING °*URINARY PROBLEMS (pain or burning when urinating, or frequent urination) °*BOWEL PROBLEMS (unusual diarrhea, constipation, pain near the anus) °TENDERNESS IN MOUTH AND THROAT WITH OR WITHOUT PRESENCE OF ULCERS (sore throat, sores in mouth, or a toothache) °UNUSUAL RASH, SWELLING OR PAIN  °UNUSUAL VAGINAL DISCHARGE OR ITCHING  ° °Items with * indicate a potential emergency and should be followed up as soon as possible or go to the Emergency Department if any problems should occur. ° °Please show the CHEMOTHERAPY ALERT CARD or IMMUNOTHERAPY ALERT CARD at check-in to the  Emergency Department and triage nurse. ° °Should you have questions after your visit or need to cancel or reschedule your appointment, please contact [facility]  Dept: [phone]  and follow the prompts.  Office hours are 8:00 a.m. to 4:30 p.m. Monday - Friday. Please note that voicemails left after 4:00 p.m. may not be returned until the following business day.  We are closed weekends and major holidays. You have access to a nurse at all times for urgent questions. Please call the main number to the clinic Dept: [phone] and follow the prompts. ° ° °For any non-urgent questions, you may also contact your provider using MyChart. We now offer e-Visits for anyone 18 and older to request care online for non-urgent symptoms. For details visit [email or web address]. °  °Also download the MyChart app! Go to the app store, search "MyChart", open the app, select [facility], and log in with your MyChart username and password. ° °Due to Covid, a mask is required upon entering the hospital/clinic. If you do not have a mask, one will be given to you upon arrival. For doctor visits, patients may have 1 support person aged 18 or older with them. For treatment visits, patients cannot have anyone with them due to current Covid guidelines and our immunocompromised population.  ° °

## 2021-02-21 NOTE — Patient Instructions (Signed)
Implanted Port Insertion, Care After This sheet gives you information about how to care for yourself after your procedure. Your health care provider may also give you more specific instructions. If you have problems or questions, contact your health care provider. What can I expect after the procedure? After the procedure, it is common to have:  Discomfort at the port insertion site.  Bruising on the skin over the port. This should improve over 3-4 days. Follow these instructions at home: Port care  After your port is placed, you will get a manufacturer's information card. The card has information about your port. Keep this card with you at all times.  Take care of the port as told by your health care provider. Ask your health care provider if you or a family member can get training for taking care of the port at home. A home health care nurse may also take care of the port.  Make sure to remember what type of port you have. Incision care  Follow instructions from your health care provider about how to take care of your port insertion site. Make sure you: ? Wash your hands with soap and water before and after you change your bandage (dressing). If soap and water are not available, use hand sanitizer. ? Change your dressing as told by your health care provider. ? Leave stitches (sutures), skin glue, or adhesive strips in place. These skin closures may need to stay in place for 2 weeks or longer. If adhesive strip edges start to loosen and curl up, you may trim the loose edges. Do not remove adhesive strips completely unless your health care provider tells you to do that.  Check your port insertion site every day for signs of infection. Check for: ? Redness, swelling, or pain. ? Fluid or blood. ? Warmth. ? Pus or a bad smell.      Activity  Return to your normal activities as told by your health care provider. Ask your health care provider what activities are safe for you.  Do not  lift anything that is heavier than 10 lb (4.5 kg), or the limit that you are told, until your health care provider says that it is safe. General instructions  Take over-the-counter and prescription medicines only as told by your health care provider.  Do not take baths, swim, or use a hot tub until your health care provider approves. Ask your health care provider if you may take showers. You may only be allowed to take sponge baths.  Do not drive for 24 hours if you were given a sedative during your procedure.  Wear a medical alert bracelet in case of an emergency. This will tell any health care providers that you have a port.  Keep all follow-up visits as told by your health care provider. This is important. Contact a health care provider if:  You cannot flush your port with saline as directed, or you cannot draw blood from the port.  You have a fever or chills.  You have redness, swelling, or pain around your port insertion site.  You have fluid or blood coming from your port insertion site.  Your port insertion site feels warm to the touch.  You have pus or a bad smell coming from the port insertion site. Get help right away if:  You have chest pain or shortness of breath.  You have bleeding from your port that you cannot control. Summary  Take care of the port as told by your   health care provider. Keep the manufacturer's information card with you at all times.  Change your dressing as told by your health care provider.  Contact a health care provider if you have a fever or chills or if you have redness, swelling, or pain around your port insertion site.  Keep all follow-up visits as told by your health care provider. This information is not intended to replace advice given to you by your health care provider. Make sure you discuss any questions you have with your health care provider. Document Revised: 05/06/2018 Document Reviewed: 05/06/2018 Elsevier Patient Education   2021 Elsevier Inc.  

## 2021-02-21 NOTE — Progress Notes (Signed)
Unable to get blood return from pt's port. Pt was given cathflo by Corene Cornea, RN at [phone]. Pt sent back to lab for peripheral lab draw.

## 2021-02-22 ENCOUNTER — Ambulatory Visit: Payer: 59

## 2021-02-22 ENCOUNTER — Inpatient Hospital Stay: Payer: 59

## 2021-02-22 ENCOUNTER — Telehealth: Payer: Self-pay | Admitting: Family Medicine

## 2021-02-22 NOTE — Telephone Encounter (Signed)
   Rileyann A Merry DOB: 1966-02-14 MRN: [id number]   RIDER WAIVER AND RELEASE OF LIABILITY  For purposes of improving physical access to our facilities, [facility] is pleased to partner with third parties to provide [facility] patients or other authorized individuals the option of convenient, on-demand ground transportation services (the [place]") through use of the technology service that enables users to request on-demand ground transportation from independent third-party providers.  By opting to use and accept these [employer], I, the undersigned, hereby agree on behalf of myself, and on behalf of any minor child using the [employer] for whom I am the parent or legal guardian, as follows:  1. Government social research officer provided to me are provided by independent third-party transportation providers who are not [employer] or employees and who are unaffiliated with [employer]. 2. [facility] is neither a transportation carrier nor a common or public carrier. 3. [facility] has no control over the quality or safety of the transportation that occurs as a result of the [employer]. 4. [facility] cannot guarantee that any third-party transportation provider will complete any arranged transportation service. 5. [facility] makes no representation, warranty, or guarantee regarding the reliability, timeliness, quality, safety, suitability, or availability of any of the Transport Services or that they will be error free. 6. I fully understand that traveling by vehicle involves risks and dangers of serious bodily injury, including permanent disability, paralysis, and death. I agree, on behalf of myself and on behalf of any minor child using the Transport Services for whom I am the parent or legal guardian, that the entire risk arising out of my use of the [employer] remains solely with me, to the maximum extent permitted under applicable law. 7. The [employer] are provided "as is" and "as available." [facility] disclaims all representations and warranties, express, implied or statutory, not expressly set out in these terms, including the implied warranties of merchantability and fitness for a particular purpose. 8. I hereby waive and release [facility], its agents, employees, officers, directors, representatives, insurers, attorneys, assigns, successors, subsidiaries, and affiliates from any and all past, present, or future claims, demands, liabilities, actions, causes of action, or suits of any kind directly or indirectly arising from acceptance and use of the [employer]. 9. I further waive and release [facility] and its affiliates from all present and future liability and responsibility for any injury or death to persons or damages to property caused by or related to the use of the [employer]. 10. I have read this Waiver and Release of Liability, and I understand the terms used in it and their legal significance. This Waiver is freely and voluntarily given with the understanding that my right (as well as the right of any minor child for whom I am the parent or legal guardian using the [employer]) to legal recourse against [facility] in connection with the [employer] is knowingly surrendered in return for use of these services.   I attest that I read the consent document to Curtisha A Tunison, gave Ms. Fait the opportunity to ask questions and answered the questions asked (if any). I affirm that [facility] then provided consent for she's participation in this program.     Drucie Ip

## 2021-02-23 ENCOUNTER — Ambulatory Visit: Payer: 59

## 2021-02-23 ENCOUNTER — Inpatient Hospital Stay: Payer: 59

## 2021-02-23 ENCOUNTER — Other Ambulatory Visit: Payer: Self-pay

## 2021-02-23 VITALS — BP 117/72 | HR 88 | Temp 98.0°F | Resp 18

## 2021-02-23 DIAGNOSIS — Z170 Estrogen receptor positive status [ER+]: Secondary | ICD-10-CM

## 2021-02-23 DIAGNOSIS — Z5112 Encounter for antineoplastic immunotherapy: Secondary | ICD-10-CM | POA: Diagnosis not present

## 2021-02-23 MED ORDER — FILGRASTIM-SNDZ 300 MCG/0.5ML IJ SOSY
PREFILLED_SYRINGE | INTRAMUSCULAR | Status: AC
  Filled 2021-02-23: qty 0.5

## 2021-02-23 MED ORDER — FILGRASTIM-SNDZ 300 MCG/0.5ML IJ SOSY
300.0000 ug | PREFILLED_SYRINGE | Freq: Once | INTRAMUSCULAR | Status: AC
  Administered 2021-02-23: 300 ug via SUBCUTANEOUS
  Filled 2021-02-23: qty 0.5

## 2021-02-23 NOTE — Patient Instructions (Signed)

## 2021-02-24 ENCOUNTER — Ambulatory Visit: Payer: 59

## 2021-02-24 ENCOUNTER — Inpatient Hospital Stay: Payer: 59

## 2021-02-24 VITALS — BP 110/69 | HR 99 | Temp 98.4°F | Resp 16

## 2021-02-24 DIAGNOSIS — Z5112 Encounter for antineoplastic immunotherapy: Secondary | ICD-10-CM | POA: Diagnosis not present

## 2021-02-24 DIAGNOSIS — Z170 Estrogen receptor positive status [ER+]: Secondary | ICD-10-CM

## 2021-02-24 MED ORDER — FILGRASTIM-SNDZ 300 MCG/0.5ML IJ SOSY
PREFILLED_SYRINGE | INTRAMUSCULAR | Status: AC
  Filled 2021-02-24: qty 0.5

## 2021-02-24 MED ORDER — FILGRASTIM-SNDZ 300 MCG/0.5ML IJ SOSY
300.0000 ug | PREFILLED_SYRINGE | Freq: Once | INTRAMUSCULAR | Status: AC
  Administered 2021-02-24: 300 ug via SUBCUTANEOUS
  Filled 2021-02-24: qty 0.5

## 2021-02-24 MED ORDER — FILGRASTIM-SNDZ 480 MCG/0.8ML IJ SOSY
PREFILLED_SYRINGE | INTRAMUSCULAR | Status: AC
  Filled 2021-02-24: qty 0.8

## 2021-02-24 NOTE — Patient Instructions (Signed)

## 2021-02-25 ENCOUNTER — Inpatient Hospital Stay: Payer: 59

## 2021-02-25 ENCOUNTER — Other Ambulatory Visit: Payer: Self-pay

## 2021-02-25 VITALS — BP 128/89 | HR 109 | Temp 98.2°F | Resp 18

## 2021-02-25 DIAGNOSIS — Z170 Estrogen receptor positive status [ER+]: Secondary | ICD-10-CM

## 2021-02-25 DIAGNOSIS — Z5112 Encounter for antineoplastic immunotherapy: Secondary | ICD-10-CM | POA: Diagnosis not present

## 2021-02-25 MED ORDER — FILGRASTIM-SNDZ 300 MCG/0.5ML IJ SOSY
300.0000 ug | PREFILLED_SYRINGE | Freq: Once | INTRAMUSCULAR | Status: AC
Start: 2021-02-25 — End: 2021-02-25
  Administered 2021-02-25: 300 ug via SUBCUTANEOUS

## 2021-02-27 NOTE — Progress Notes (Signed)
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 She is accompanied by interpreter  She was started on neoadjuvant chemotherapy on 02/07/2021. This consists of pembrolizumab given day 1 together with carboplatin and paclitaxel, with Taxol given on day 8 and 15, and zarxio or equivalent on days 16,17 and 18, repeated every 21 days x 4.  This will be followed by [facility] x4, with pembrolizumab each cycle.  She will be done with chemotherapy in September.  Her most recent echocardiogram on 01/31/2021 showed an ejection fraction of 60-65%.   REVIEW OF SYSTEMS: Rebecca [name] did generally quite well with her first cycle of treatment.  She had nausea after day 1 but no vomiting.  She felt a bit weak.  She had insomnia.  After the second treatment, with Taxol alone, she had diarrhea for 2 days, mild.  This did not recur with her third treatment, the second dose of Taxol alone.  She then received the filgatrim for 3 days, and had some aches as expected and a little swelling in the index finger of the left hand which is probably unrelated.  Aside from these issues as she has lost her hair.  She is having minimal hyperpigmentation.  She has had no headaches.  She has had some blurred vision.  She says her taste is  okay and she is eating and drinking well.  Overall as she has tolerated the first cycle of therapy without any major complications.   COVID 19 VACCINATION STATUS: fully vaccinated [employer]), with booster 12/2020   HISTORY OF CURRENT ILLNESS: From the original intake note:  Rebecca [name] herself palpated a mass in the right axilla. She underwent bilateral diagnostic mammography with tomography and right breast ultrasonography at [facility] on 01/03/2021 showing: breast density category B; two adjacent suspicious right breast masses at 5 o'clock; markedly abnormal right axillary lymph node; no evidence of malignancy on left.  Accordingly on 01/06/2021 she proceeded to biopsy of the right breast area in question. The pathology from this procedure ([id number]) showed: invasive ductal carcinoma, grade 3. Prognostic indicators significant for: estrogen receptor, 10% positive with weak staining intensity and progesterone receptor, 0% negative. Proliferation marker Ki67 at 90%. HER2 equivocal by immunohistochemistry (2+), but negative by fluorescent in situ hybridization with a signals ratio 1.38 and number per cell 3.63.  Cancer Staging Malignant neoplasm of lower-inner quadrant of right breast of female, estrogen receptor positive ([place]) Staging form: Breast, AJCC 8th Edition - Clinical: Stage IIIB (cT2, cN1, cM0, G3, ER-, PR-, HER2-) - Signed by Rebecca Cruel, MD on 01/18/2021 Stage prefix: Initial diagnosis Histologic grading system: 3 grade system  [name] is as detailed below.   PAST MEDICAL HISTORY: Past Medical History:  Diagnosis Date  . Arthritis   . Breast cancer ([place])   . Diabetes mellitus   . Family history of lymphoma 01/18/2021  .  Family history of throat cancer 01/18/2021  . Hyperlipidemia   . Hypertension     PAST SURGICAL HISTORY: Past Surgical History:  Procedure Laterality Date  . CESAREAN SECTION    . PORTACATH PLACEMENT N/A  01/26/2021   Procedure: INSERTION PORT-A-CATH;  Surgeon: Rebecca Luna, MD;  Location: [facility] OR;  Service: General;  Laterality: N/A;  75    FAMILY HISTORY: Family History  Problem Relation Age of Onset  . Diabetes Mother   . Heart disease Mother   . Diabetes Father   . Throat cancer Father        d. 45s  . Diabetes Sister   . Diabetes Brother   . Lymphoma Cousin        maternal cousin; d. 18s   Her father died in his 32's and her mother in her 45'W, both from complications of diabetes. Rebecca [name] has 6 brothers and 1 sister. There is no family history of breast or ovarian cancer to her knowledge.   GYNECOLOGIC HISTORY:  No LMP recorded. Menarche: 55 years old Rebecca [name] P 0 LMP 10/31/2020 (before that, 06/2020) Contraceptive used from 2017-2018 HRT n/a  Hysterectomy? no BSO? no   SOCIAL HISTORY: (updated 12/2020)  Rebecca [name] is originally from [place]. 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 She is divorced. She lives at home with her nephew Rebecca [name], who is 48 and works for [employer]. She attends [employer] ([employer]).    ADVANCED DIRECTIVES: not in place; at the 01/18/2021 visit she stated her ex-husband Rebecca [name], who is disabled due to past injuries, ([phone]) would be the person to contact if she became incapacitated [the patient states aside from her nephew she has no other relatives in [place]: Social History   Tobacco Use  . Smoking status: Passive Smoke Exposure - Never Smoker  . Smokeless tobacco: Never Used  Substance Use Topics  . Alcohol use: No  . Drug use: No     Colonoscopy: never done, referral in place  PAP: 12/2020, negative  Bone density: never done   Allergies  Allergen Reactions  . Lisinopril Swelling    Swelling in throat, coughing, difficulty swallowing, could not sleep. Occurred Dec 2020.    Current Outpatient Medications  Medication Sig  Dispense Refill  . amLODipine (NORVASC) 10 MG tablet TAKE 1 TABLET BY MOUTH EVERY DAY 90 tablet 0  . Ascorbic Acid (VITAMIN C) 1000 MG tablet Take 1,000 mg by mouth daily.    Marland Kitchen atorvastatin (LIPITOR) 80 MG tablet TAKE 1 TABLET (80 MG TOTAL) BY MOUTH DAILY. 90 tablet 1  . Blood Glucose Monitoring Suppl (ONETOUCH VERIO REFLECT) w/Device KIT 1 kit by Does not apply route in the morning, at noon, and at bedtime. Use as directed to test blood sugar up to three times daily.E11.9 1 kit 0  . cetirizine (ZYRTEC) 10 MG tablet Take 1 tablet (10 mg total) by mouth daily. (Patient not taking: No sig reported) 30 tablet 0  . cloNIDine (CATAPRES) 0.1 MG tablet Take 1 tablet (0.1 mg total) by mouth at bedtime as needed. (Patient not taking: No sig reported) 90 tablet 1  . clotrimazole (CLOTRIMAZOLE AF) 1 % cream Apply 1 application topically 2 (two) times daily. 30 g 1  . cyclobenzaprine (FLEXERIL) 10 MG tablet Take 10 mg by mouth 3 (three) times daily as needed for muscle spasms.    . dapagliflozin propanediol (FARXIGA) 10 MG TABS tablet Take 1  tablet (10 mg total) by mouth daily before breakfast. 90 tablet 1  . dexamethasone (DECADRON) 4 MG tablet TAKE 2 TABLETS ONCE A DAY FOR 3 DAYS AFTER CARBOPLATIN AND AC CHEMOTHERAPY. TAKE WITH FOOD. 30 tablet 1  . ergocalciferol (DRISDOL) 1.25 MG (50000 UT) capsule Take 1 capsule (50,000 Units total) by mouth once a week. 12 capsule 0  . glipiZIDE (GLUCOTROL) 5 MG tablet TAKE 1 TABLET BY MOUTH DAILY (Patient taking differently: Take 5 mg by mouth daily before breakfast.) 90 tablet 1  . glucose blood (ONETOUCH VERIO) test strip Use as directed to test blood sugar up to three times daily. E11.9 100 each 6  . Lancets (ONETOUCH DELICA PLUS [id number]) MISC 1 Device by Does not apply route in the morning, at noon, and at bedtime. Use as directed to test blood sugar up to three times daily. E11.9 100 each 6  . lidocaine-prilocaine (EMLA) cream APPLY TO AFFECTED AREA AS DIRECTED 30 g  3  . LORazepam (ATIVAN) 0.5 MG tablet Take 1 tablet by mouth at bedtime. 30 tablet 0  . LORazepam (ATIVAN) 0.5 MG tablet Place 1 tablet (0.5 mg total) under the tongue 2 (two) times daily as needed (Nausea). 40 tablet 0  . metFORMIN (GLUCOPHAGE) 500 MG tablet TAKE 1 TABLET BY MOUTH TWICE DAILY 180 tablet 1  . naproxen (NAPROSYN) 500 MG tablet Take 1 tablet (500 mg total) by mouth 2 (two) times daily with a meal. (Patient not taking: No sig reported) 30 tablet 1  . Omega-3 1000 MG CAPS Take 1,000 mg by mouth daily.    Marland Kitchen omeprazole (PRILOSEC) 20 MG capsule Take 1 capsule (20 mg total) by mouth daily. (Patient not taking: No sig reported) 30 capsule 0  . oxyCODONE (OXY IR/ROXICODONE) 5 MG immediate release tablet Take 1 tablet (5 mg total) by mouth every 6 (six) hours as needed for severe pain. 15 tablet 0  . polyethylene glycol (MIRALAX / GLYCOLAX) 17 g packet TAKE 17 G BY MOUTH DAILY. (Patient not taking: No sig reported) 14 each 0  . prochlorperazine (COMPAZINE) 10 MG tablet TAKE 1 TABLET BY MOUTH EVERY 6 HOURS AS NEEDED FOR NAUSEA OR VOMITING 30 tablet 1  . vitamin B-12 (CYANOCOBALAMIN) 100 MCG tablet Take 100 mcg by mouth daily.     No current facility-administered medications for this visit.    OBJECTIVE: Rebecca [name] woman in no acute distress  Vitals:   02/28/21 0818  BP: (!) 141/73  Pulse: 96  Resp: 18  Temp: 98.1 F (36.7 C)  SpO2: 100%     Body mass index is 23.47 kg/m.   Wt Readings from Last 3 Encounters:  02/28/21 132 lb 8 oz (60.1 kg)  02/14/21 136 lb 8 oz (61.9 kg)  02/02/21 136 lb 1.6 oz (61.7 kg)      ECOG FS:1 - Symptomatic but completely ambulatory  Sclerae unicteric, EOMs intact Wearing a mask No cervical or supraclavicular adenopathy Lungs no rales or rhonchi Heart regular rate and rhythm Abd soft, nontender, positive bowel sounds MSK no focal spinal tenderness, no upper extremity lymphedema Neuro: nonfocal, well oriented, appropriate affect Breasts: The  right breast shows no palpable mass.  I no longer palpate any mass in the right axilla.  The left breast and left axilla are benign   LAB RESULTS:  CMP     Component Value Date/Time   NA 143 02/21/2021 0911   NA 140 12/27/2020 1123   K 3.7 02/21/2021 0911   CL 105 02/21/2021 0911  CO2 29 02/21/2021 0911   GLUCOSE 74 02/21/2021 0911   BUN 16 02/21/2021 0911   BUN 11 12/27/2020 1123   CREATININE 0.61 02/21/2021 0911   CREATININE 0.50 11/02/2016 1102   CALCIUM 9.3 02/21/2021 0911   PROT 7.1 02/21/2021 0911   PROT 7.5 12/27/2020 1123   ALBUMIN 3.9 02/21/2021 0911   ALBUMIN 4.7 12/27/2020 1123   AST 12 (L) 02/21/2021 0911   ALT 12 02/21/2021 0911   ALKPHOS 55 02/21/2021 0911   BILITOT 0.3 02/21/2021 0911   GFRNONAA >60 02/21/2021 0911   GFRNONAA >89 01/16/2016 1006   GFRAA 118 06/02/2020 0942   GFRAA >89 01/16/2016 1006    No results found for: TOTALPROTELP, ALBUMINELP, A1GS, A2GS, BETS, BETA2SER, GAMS, MSPIKE, SPEI  Lab Results  Component Value Date   WBC 3.5 (L) 02/21/2021   NEUTROABS 1.2 (L) 02/21/2021   HGB 12.2 02/21/2021   HCT 38.0 02/21/2021   MCV 82.4 02/21/2021   PLT 293 02/21/2021    No results found for: LABCA2  No components found for: [id number]  No results for input(s): INR in the last 168 hours.  No results found for: LABCA2  No results found for: HYQ657  No results found for: QIO962  No results found for: XBM841  No results found for: CA2729  No components found for: HGQUANT  No results found for: CEA1 / No results found for: CEA1   No results found for: AFPTUMOR  No results found for: CHROMOGRNA  No results found for: KPAFRELGTCHN, LAMBDASER, KAPLAMBRATIO (kappa/lambda light chains)  No results found for: HGBA, HGBA2QUANT, HGBFQUANT, HGBSQUAN (Hemoglobinopathy evaluation)   No results found for: LDH  No results found for: IRON, TIBC, IRONPCTSAT (Iron and TIBC)  No results found for: FERRITIN  Urinalysis    Component Value  Date/Time   COLORURINE YELLOW 06/24/2007 0116   APPEARANCEUR CLOUDY (A) 06/24/2007 0116   LABSPEC 1.025 09/18/2020 1125   PHURINE 5.5 09/18/2020 1125   GLUCOSEU 100 (A) 09/18/2020 1125   HGBUR TRACE (A) 09/18/2020 1125   BILIRUBINUR NEGATIVE 09/18/2020 1125   KETONESUR NEGATIVE 09/18/2020 1125   PROTEINUR NEGATIVE 09/18/2020 1125   UROBILINOGEN 0.2 09/18/2020 1125   NITRITE NEGATIVE 09/18/2020 1125   LEUKOCYTESUR NEGATIVE 09/18/2020 1125    STUDIES: NM Bone Scan Whole Body  Result Date: 02/02/2021 CLINICAL DATA:  Newly diagnosed breast cancer EXAM: NUCLEAR MEDICINE WHOLE BODY BONE SCAN TECHNIQUE: Whole body anterior and posterior images were obtained approximately 3 hours after intravenous injection of radiopharmaceutical. RADIOPHARMACEUTICALS:  21.4 mCi Technetium-3mMDP IV COMPARISON:  CT chest 01/27/2021 FINDINGS: Physiologic distribution of radiotracer with renal uptake and excretion. No focal abnormal radiotracer accumulation within the axial or appendicular skeleton to suggest metastatic disease. IMPRESSION: No scintigraphic evidence of osseous metastatic disease. Electronically Signed   By: NDavina PokeD.O.   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 LABS:  Not performed EXAM: BILATERAL BREAST MRI WITH AND WITHOUT CONTRAST TECHNIQUE: Multiplanar, multisequence MR images of both breasts were obtained prior to and following the intravenous administration of 6 ml of Gadavist Three-dimensional MR images were rendered by post-processing of the original MR data on an independent workstation. The three-dimensional MR images were interpreted, and findings are reported in the following complete MRI report for this study. Three dimensional images were evaluated at the independent interpreting workstation  using  the DynaCAD thin client. COMPARISON:  Prior mammograms and ultrasounds FINDINGS: Breast composition: b. Scattered fibroglandular tissue. Background parenchymal enhancement: Minimal Right breast: Two adjacent 2 cm masses are identified within the LOWER RIGHT breast, middle to posterior depth with biopsy clip noted within the anterior mass, compatible with biopsy-proven malignancy. The 2 masses together measure 2.1 x 2.3 x 1.5 cm (transverse x AP x CC) No other suspicious mass or enhancement identified within the RIGHT breast. Left breast: No mass or abnormal enhancement. Lymph nodes: A single abnormal enlarged RIGHT axillary lymph node is noted containing biopsy clip artifact and compatible with metastasis involving a RIGHT axillary lymph node. No other abnormal lymph nodes are identified. Ancillary findings:  None. IMPRESSION: 1. Two adjacent LOWER RIGHT breast masses, spanning an area measuring 2.1 x 2.3 x 1.5 cm, compatible with biopsy-proven malignancy. No other suspicious RIGHT breast abnormalities noted. 2. Single enlarged RIGHT axillary lymph node compatible with biopsy-proven metastasis. 3. No evidence of LEFT breast malignancy. RECOMMENDATION: Treatment plan BI-RADS CATEGORY  6: Known biopsy-proven malignancy. Electronically Signed   By: Margarette Canada M.D.   On: 01/30/2021 12:08   ECHOCARDIOGRAM COMPLETE  Result Date: 01/31/2021    ECHOCARDIOGRAM REPORT   Patient Name:   PHEBE DETTMER Date of Exam: 01/31/2021 Medical Rec #:  [id number]            Height:       63.0 in Accession #:    [id number]           Weight:       139.6 lb Date of Birth:  [birth year]            BSA:          1.659 m Patient Age:    55 years             BP:           115/75 mmHg Patient Gender: F                    HR:           79 bpm. Exam Location:  Outpatient Procedure: 2D Echo, 3D Echo, Cardiac Doppler, Color Doppler and Strain Analysis Indications:    Z51.11 Encounter for antineoplastic chemotheraphy  History:        Patient  has no prior history of Echocardiogram examinations.                 Risk Factors:Hypertension, Diabetes and Dyslipidemia. Breast                 Cancer.  Sonographer:    Rebecca Sidle Dance Referring Phys: [place]  1. Left ventricular ejection fraction, by estimation, is 60 to 65%. Left ventricular ejection fraction by 3D volume is 60 %. The left ventricle has normal function. The left ventricle has no regional wall motion abnormalities. There is mild asymmetric left ventricular hypertrophy of the basal-septal segment. Left ventricular diastolic parameters are consistent with Grade I diastolic dysfunction (impaired relaxation).  2. Right ventricular systolic function is normal. The right ventricular size is normal. There is normal pulmonary artery systolic pressure.  3. The mitral valve is normal in structure. Trivial mitral valve regurgitation.  4. The aortic valve is tricuspid. Aortic valve regurgitation is not visualized. No aortic stenosis is present.  5. The inferior vena cava is normal in size with greater than 50% respiratory variability, suggesting right atrial pressure of 3 mmHg. Comparison(s): No  prior Echocardiogram. FINDINGS  Left Ventricle: Left ventricular ejection fraction, by estimation, is 60 to 65%. Left ventricular ejection fraction by 3D volume is 60 %. The left ventricle has normal function. The left ventricle has no regional wall motion abnormalities. Global longitudinal strain performed but not reported based on interpreter judgement due to suboptimal tracking. The left ventricular internal cavity size was normal in size. There is mild asymmetric left ventricular hypertrophy of the basal-septal segment. Left ventricular diastolic parameters are consistent with Grade I diastolic dysfunction (impaired relaxation). Right Ventricle: The right ventricular size is normal. No increase in right ventricular wall thickness. Right ventricular systolic function is normal. There is  normal pulmonary artery systolic pressure. The tricuspid regurgitant velocity is 1.95 m/s, and  with an assumed right atrial pressure of 3 mmHg, the estimated right ventricular systolic pressure is 83.3 mmHg. Left Atrium: Left atrial size was normal in size. Right Atrium: Right atrial size was normal in size. Pericardium: There is no evidence of pericardial effusion. Mitral Valve: The mitral valve is normal in structure. There is mild thickening of the mitral valve leaflet(s). Trivial mitral valve regurgitation. Tricuspid Valve: The tricuspid valve is normal in structure. Tricuspid valve regurgitation is trivial. Aortic Valve: The aortic valve is tricuspid. Aortic valve regurgitation is not visualized. No aortic stenosis is present. Pulmonic Valve: The pulmonic valve was normal in structure. Pulmonic valve regurgitation is not visualized. Aorta: The aortic root and ascending aorta are structurally normal, with no evidence of dilitation. Venous: The inferior vena cava is normal in size with greater than 50% respiratory variability, suggesting right atrial pressure of 3 mmHg. IAS/Shunts: The interatrial septum is aneurysmal. No atrial level shunt detected by color flow Doppler.  LEFT VENTRICLE PLAX 2D LVIDd:         3.90 cm         Diastology LVIDs:         2.50 cm         LV e' medial:    5.98 cm/s LV PW:         1.10 cm         LV E/e' medial:  12.4 LV IVS:        0.90 cm         LV e' lateral:   8.49 cm/s LVOT diam:     1.80 cm         LV E/e' lateral: 8.8 LV SV:         44 LV SV Index:   26 LVOT Area:     2.54 cm        3D Volume EF                                LV 3D EF:    Left                                             ventricular                                             ejection  fraction by                                             3D volume                                             is 60 %.                                 3D Volume EF:                                 3D EF:        60 %                                LV EDV:       95 ml                                LV ESV:       38 ml                                LV SV:        57 ml RIGHT VENTRICLE             IVC RV Basal diam:  2.70 cm     IVC diam: 1.30 cm RV S prime:     11.30 cm/s TAPSE (M-mode): 1.5 cm LEFT ATRIUM             Index       RIGHT ATRIUM          Index LA diam:        3.60 cm 2.17 cm/m  RA Area:     9.89 cm LA Vol (A2C):   33.0 ml 19.89 ml/m RA Volume:   18.00 ml 10.85 ml/m LA Vol (A4C):   25.0 ml 15.07 ml/m LA Biplane Vol: 29.1 ml 17.54 ml/m  AORTIC VALVE LVOT Vmax:   85.50 cm/s LVOT Vmean:  62.000 cm/s LVOT VTI:    0.171 m  AORTA Ao Root diam: 3.00 cm Ao Asc diam:  2.50 cm MITRAL VALVE               TRICUSPID VALVE MV Area (PHT): 3.72 cm    TR Peak grad:   15.2 mmHg MV Decel Time: 204 msec    TR Vmax:        195.00 cm/s MV E velocity: 74.40 cm/s MV A velocity: 99.40 cm/s  SHUNTS MV E/A ratio:  0.75        Systemic VTI:  0.17 m                            Systemic Diam: 1.80 cm Gwyndolyn Kaufman MD Electronically signed by Gwyndolyn Kaufman MD Signature Date/Time: 01/31/2021/10:27:15 AM    Final      ELIGIBLE FOR AVAILABLE RESEARCH PROTOCOL:  no  ASSESSMENT: 55 y.o. 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, grade 3, with and MIB-1 of 90%  (a) chest CT scan 01/29/2021 shows no evidence of metastatic disease  (b) bone scan 02/01/2021 shows no evidence of metastatic disease  (1) neoadjuvant chemotherapy will consist of pembrolizumab given day 1 together with carboplatin and paclitaxel, with Taxol given on day 8 and 15, and fill gastrium on days 16,17 and 18, repeated every 21 days x 4, followed by doxorubicin and cyclophosphamide every 21 days x 4 also given with pembrolizumab on day 1  (a) echo 01/31/2021 shows an ejection fraction in the 6065%  range.  (2) definitive surgery to follow  (3) adjuvant radiation  (4) genetics testing  Negative hereditary cancer genetic testing: no pathogenic variants detected in Rebecca [name] CancerNext-Expanded + RNAinsight Panel.  The report date is January 31, 2021.  The CancerNext-Expanded gene panel offered by [facility] and includes sequencing, rearrangement, and RNA analysis for the following 77 genes: AIP, ALK, APC, ATM, AXIN2, BAP1, BARD1, BLM, BMPR1A, BRCA1, BRCA2, BRIP1, CDC73, CDH1, CDK4, CDKN1B, CDKN2A, CHEK2, CTNNA1, DICER1, FANCC, FH, FLCN, GALNT12, KIF1B, LZTR1, MAX, MEN1, MET, MLH1, MSH2, MSH3, MSH6, MUTYH, NBN, NF1, NF2, NTHL1, PALB2, PHOX2B, PMS2, POT1, PRKAR1A, PTCH1, PTEN, RAD51C, RAD51D, RB1, RECQL, RET, SDHA, SDHAF2, SDHB, SDHC, SDHD, SMAD4, SMARCA4, SMARCB1, SMARCE1, STK11, SUFU, TMEM127, TP53, TSC1, TSC2, VHL and XRCC2 (sequencing and deletion/duplication); EGFR, EGLN1, HOXB13, KIT, MITF, PDGFRA, POLD1, and POLE (sequencing only); EPCAM and GREM1 (deletion/duplication only).     PLAN: Rebecca [name] appears to be having a good early response to her chemo, with no palpable adenopathy in the right axilla at present.  This is very favorable.  She tolerated the first cycle well.  I reviewed her overall treatment so she understands she is starting a second 4-week cycle today and she has 2 more of these 4 weeks cycles before she starts for 3-week cycles for a total of 8 cycles.  She is getting all the dates through day 1 of cycle 3 today.  She will have her surgery then in October and her radiation in November.  She will be out of work until January 2023 and I wrote her a paper stating that to facilitate scheduling at work for her  I have encouraged her to call us with any questions or problems.  Otherwise we will see her again with day 1 of cycle 2.  Total encounter time 35 minutes.Sarajane Jews C. Lin Hackmann, MD 02/28/2021 8:28 AM Medical Oncology and Hematology [facility] [place], [place] [address] Tel. [phone]    Fax. [phone]   This document serves as a record of services personally performed by Lurline Del, MD. It was created on his behalf by Wilburn Mylar, a trained medical scribe. The creation of this record is based on the scribe's personal observations and the provider's statements to them.   I, Lurline Del MD, have reviewed the above documentation for accuracy and completeness, and I agree with the above.   *Total Encounter Time as defined by the Centers for Medicare and Medicaid Services includes, in addition to the face-to-face time of a patient visit (documented in the note above) non-face-to-face time: obtaining and reviewing outside history, ordering and reviewing medications, tests or procedures, care coordination (communications with other health care professionals or caregivers) and documentation in the medical record.

## 2021-02-28 ENCOUNTER — Inpatient Hospital Stay (HOSPITAL_BASED_OUTPATIENT_CLINIC_OR_DEPARTMENT_OTHER): Payer: 59 | Admitting: Oncology

## 2021-02-28 ENCOUNTER — Other Ambulatory Visit: Payer: Self-pay

## 2021-02-28 ENCOUNTER — Other Ambulatory Visit: Payer: Self-pay | Admitting: Oncology

## 2021-02-28 ENCOUNTER — Inpatient Hospital Stay: Payer: 59

## 2021-02-28 VITALS — BP 141/73 | HR 96 | Temp 98.1°F | Resp 18 | Ht 63.0 in | Wt 132.5 lb

## 2021-02-28 DIAGNOSIS — Z5112 Encounter for antineoplastic immunotherapy: Secondary | ICD-10-CM | POA: Diagnosis not present

## 2021-02-28 DIAGNOSIS — Z170 Estrogen receptor positive status [ER+]: Secondary | ICD-10-CM

## 2021-02-28 DIAGNOSIS — C50311 Malignant neoplasm of lower-inner quadrant of right female breast: Secondary | ICD-10-CM | POA: Diagnosis not present

## 2021-02-28 DIAGNOSIS — Z95828 Presence of other vascular implants and grafts: Secondary | ICD-10-CM

## 2021-02-28 LAB — CBC WITH DIFFERENTIAL (CANCER CENTER ONLY)
Abs Immature Granulocytes: 0.31 10*3/uL — ABNORMAL HIGH (ref 0.00–0.07)
Basophils Absolute: 0.1 10*3/uL (ref 0.0–0.1)
Basophils Relative: 1 %
Eosinophils Absolute: 0 10*3/uL (ref 0.0–0.5)
Eosinophils Relative: 1 %
HCT: 38.5 % (ref 36.0–46.0)
Hemoglobin: 12.1 g/dL (ref 12.0–15.0)
Immature Granulocytes: 6 %
Lymphocytes Relative: 47 %
Lymphs Abs: 2.6 10*3/uL (ref 0.7–4.0)
MCH: 26.2 pg (ref 26.0–34.0)
MCHC: 31.4 g/dL (ref 30.0–36.0)
MCV: 83.3 fL (ref 80.0–100.0)
Monocytes Absolute: 0.5 10*3/uL (ref 0.1–1.0)
Monocytes Relative: 9 %
Neutro Abs: 2 10*3/uL (ref 1.7–7.7)
Neutrophils Relative %: 36 %
Platelet Count: 183 10*3/uL (ref 150–400)
RBC: 4.62 MIL/uL (ref 3.87–5.11)
RDW: 13.8 % (ref 11.5–15.5)
WBC Count: 5.6 10*3/uL (ref 4.0–10.5)
nRBC: 0 % (ref 0.0–0.2)

## 2021-02-28 LAB — CMP (CANCER CENTER ONLY)
ALT: 11 U/L (ref 0–44)
AST: 11 U/L — ABNORMAL LOW (ref 15–41)
Albumin: 4 g/dL (ref 3.5–5.0)
Alkaline Phosphatase: 69 U/L (ref 38–126)
Anion gap: 10 (ref 5–15)
BUN: 14 mg/dL (ref 6–20)
CO2: 24 mmol/L (ref 22–32)
Calcium: 9.7 mg/dL (ref 8.9–10.3)
Chloride: 105 mmol/L (ref 98–111)
Creatinine: 0.63 mg/dL (ref 0.44–1.00)
GFR, Estimated: >60 mL/min (ref >60)
Glucose, Bld: 75 mg/dL (ref 70–99)
Potassium: 3.8 mmol/L (ref 3.5–5.1)
Sodium: 139 mmol/L (ref 135–145)
Total Bilirubin: <0.2 mg/dL — ABNORMAL LOW (ref 0.3–1.2)
Total Protein: 7.6 g/dL (ref 6.5–8.1)

## 2021-02-28 MED ORDER — SODIUM CHLORIDE 0.9 % IV SOLN
200.0000 mg | Freq: Once | INTRAVENOUS | Status: AC
  Administered 2021-02-28: 200 mg via INTRAVENOUS
  Filled 2021-02-28: qty 8

## 2021-02-28 MED ORDER — DIPHENHYDRAMINE HCL 50 MG/ML IJ SOLN
INTRAMUSCULAR | Status: AC
  Filled 2021-02-28: qty 1

## 2021-02-28 MED ORDER — SODIUM CHLORIDE 0.9 % IV SOLN
10.0000 mg | Freq: Once | INTRAVENOUS | Status: AC
  Administered 2021-02-28: 10 mg via INTRAVENOUS
  Filled 2021-02-28: qty 10

## 2021-02-28 MED ORDER — SODIUM CHLORIDE 0.9 % IV SOLN
80.0000 mg/m2 | Freq: Once | INTRAVENOUS | Status: AC
  Administered 2021-02-28: 132 mg via INTRAVENOUS
  Filled 2021-02-28: qty 22

## 2021-02-28 MED ORDER — PALONOSETRON HCL INJECTION 0.25 MG/5ML
INTRAVENOUS | Status: AC
  Filled 2021-02-28: qty 5

## 2021-02-28 MED ORDER — DIPHENHYDRAMINE HCL 50 MG/ML IJ SOLN
25.0000 mg | Freq: Once | INTRAMUSCULAR | Status: AC
  Administered 2021-02-28: 25 mg via INTRAVENOUS

## 2021-02-28 MED ORDER — SODIUM CHLORIDE 0.9% FLUSH
10.0000 mL | Freq: Once | INTRAVENOUS | Status: AC
  Administered 2021-02-28: 10 mL
  Filled 2021-02-28: qty 10

## 2021-02-28 MED ORDER — PALONOSETRON HCL INJECTION 0.25 MG/5ML
0.2500 mg | Freq: Once | INTRAVENOUS | Status: AC
  Administered 2021-02-28: 0.25 mg via INTRAVENOUS

## 2021-02-28 MED ORDER — SODIUM CHLORIDE 0.9 % IV SOLN
150.0000 mg | Freq: Once | INTRAVENOUS | Status: AC
  Administered 2021-02-28: 150 mg via INTRAVENOUS
  Filled 2021-02-28: qty 150

## 2021-02-28 MED ORDER — FAMOTIDINE 20 MG IN NS 100 ML IVPB
INTRAVENOUS | Status: AC
  Filled 2021-02-28: qty 100

## 2021-02-28 MED ORDER — ANTICOAGULANT SODIUM CITRATE 4% (200MG/5ML) IV SOLN
5.0000 mL | Freq: Once | Status: AC
Start: 2021-02-28 — End: 2021-02-28
  Administered 2021-02-28: 5 mL
  Filled 2021-02-28: qty 5

## 2021-02-28 MED ORDER — FAMOTIDINE 20 MG IN NS 100 ML IVPB
20.0000 mg | Freq: Once | INTRAVENOUS | Status: AC
  Administered 2021-02-28: 20 mg via INTRAVENOUS

## 2021-02-28 MED ORDER — SODIUM CHLORIDE 0.9 % IV SOLN
536.0000 mg | Freq: Once | INTRAVENOUS | Status: AC
  Administered 2021-02-28: 540 mg via INTRAVENOUS
  Filled 2021-02-28: qty 54

## 2021-02-28 MED ORDER — SODIUM CHLORIDE 0.9 % IV SOLN
Freq: Once | INTRAVENOUS | Status: AC
  Filled 2021-02-28: qty 250

## 2021-02-28 NOTE — Patient Instructions (Signed)
[facility]  Discharge Instructions: Thank you for choosing [facility] to provide your oncology and hematology care.   If you have a lab appointment with the [facility], please go directly to the [facility] and check in at the registration area.   Wear comfortable clothing and clothing appropriate for easy access to any Portacath or PICC line.   We strive to give you quality time with your provider. You may need to reschedule your appointment if you arrive late (15 or more minutes).  Arriving late affects you and other patients whose appointments are after yours.  Also, if you miss three or more appointments without notifying the office, you may be dismissed from the clinic at the provider's discretion.      For prescription refill requests, have your pharmacy contact our office and allow 72 hours for refills to be completed.    Today you received the following chemotherapy and/or immunotherapy agents: Taxol, keytruda, carboplatin    To help prevent nausea and vomiting after your treatment, we encourage you to take your nausea medication as directed.  BELOW ARE SYMPTOMS THAT SHOULD BE REPORTED IMMEDIATELY: . *FEVER GREATER THAN 100.4 F (38 C) OR HIGHER . *CHILLS OR SWEATING . *NAUSEA AND VOMITING THAT IS NOT CONTROLLED WITH YOUR NAUSEA MEDICATION . *UNUSUAL SHORTNESS OF BREATH . *UNUSUAL BRUISING OR BLEEDING . *URINARY PROBLEMS (pain or burning when urinating, or frequent urination) . *BOWEL PROBLEMS (unusual diarrhea, constipation, pain near the anus) . TENDERNESS IN MOUTH AND THROAT WITH OR WITHOUT PRESENCE OF ULCERS (sore throat, sores in mouth, or a toothache) . UNUSUAL RASH, SWELLING OR PAIN  . UNUSUAL VAGINAL DISCHARGE OR ITCHING   Items with * indicate a potential emergency and should be followed up as soon as possible or go to the Emergency Department if any problems should occur.  Please show the CHEMOTHERAPY ALERT CARD or  IMMUNOTHERAPY ALERT CARD at check-in to the Emergency Department and triage nurse.  Should you have questions after your visit or need to cancel or reschedule your appointment, please contact [facility]  Dept: [phone]  and follow the prompts.  Office hours are 8:00 a.m. to 4:30 p.m. Monday - Friday. Please note that voicemails left after 4:00 p.m. may not be returned until the following business day.  We are closed weekends and major holidays. You have access to a nurse at all times for urgent questions. Please call the main number to the clinic Dept: [phone] and follow the prompts.   For any non-urgent questions, you may also contact your provider using MyChart. We now offer e-Visits for anyone 63 and older to request care online for non-urgent symptoms. For details visit [email or web address].   Also download the MyChart app! Go to the app store, search "MyChart", open the app, select [facility], and log in with your MyChart username and password.  Due to Covid, a mask is required upon entering the hospital/clinic. If you do not have a mask, one will be given to you upon arrival. For doctor visits, patients may have 1 support person aged 29 or older with them. For treatment visits, patients cannot have anyone with them due to current Covid guidelines and our immunocompromised population.

## 2021-03-01 NOTE — Progress Notes (Signed)
..  The following Medication: Rebecca [name] has been approved thru [employer] as Assistance Program. Enrollment period is 03/01/2021 to 10/21/2021.  Assistance ID: [id number].  Reason for Assistance: Insurance Denial  First DOS: 02/23/2021.

## 2021-03-02 ENCOUNTER — Telehealth: Payer: Self-pay | Admitting: Oncology

## 2021-03-02 NOTE — Telephone Encounter (Signed)
Scheduled per 5/10 los. Pt will receive an updated appt calendar per next visit

## 2021-03-06 ENCOUNTER — Telehealth: Payer: Self-pay | Admitting: *Deleted

## 2021-03-06 MED FILL — Dexamethasone Sodium Phosphate Inj 100 MG/10ML: INTRAMUSCULAR | Qty: 1 | Status: AC

## 2021-03-06 NOTE — Progress Notes (Addendum)
[facility]   Telephone:([phone]) [phone] [phone]   Clinic Follow up Note   Patient Care Team: [name], MD as PCP - General (Family Medicine) Mauro Kaufmann, RN as Oncology Nurse Navigator Rockwell Germany, RN as Oncology Nurse Navigator Erroll Luna, MD as Consulting Physician (General Surgery) Magrinat, Virgie Dad, MD as Consulting Physician (Oncology) Eppie Gibson, MD as Attending Physician (Radiation Oncology) 03/07/2021  CHIEF COMPLAINT: Follow up functionally triple negative right breast cancer   CURRENT THERAPY: Neoadjuvant chemo with pembrolizumab given day 1 together with carboplatin and paclitaxel, with Taxol given on day 8 and 15, and filgrastim on days 16,17 and 18, repeated every 21 days x 4, followed by doxorubicin and cyclophosphamide every 21 days x 4 also given with pembrolizumab on day 1  INTERVAL HISTORY: [name] returns for follow up and treatment as scheduled. She completed cycle 2 day 1 taxol/carbo and pembro on 02/28/21. She is here for C2D8 taxol today. She feels very tired, but out of bed and functional at home with rest. She wants to speak to dietician about what to eat, DM-friendly foods but will help her maintain weight on chemo. She had gum bleeding but no other bleeding. Mild nausea is managed with meds, no vomiting. Bowels moving normally. She had chills 1 day, no fever, and resolved with tylenol. She noticed mild numbness/tingling in toes last week, not constant, not limiting balance of ambulation. She did not have this before chemo.   Otherwise, denies headaches, body aches, dysuria, rash, mucositis, cough, chest pain, dyspnea, or other concerns.    MEDICAL HISTORY:  Past Medical History:  Diagnosis Date  . Arthritis   . Breast cancer ([place])   . Diabetes mellitus   . Family history of lymphoma 01/18/2021  . Family history of throat cancer 01/18/2021  . Hyperlipidemia   . Hypertension     SURGICAL HISTORY: Past  Surgical History:  Procedure Laterality Date  . CESAREAN SECTION    . PORTACATH PLACEMENT N/A 01/26/2021   Procedure: INSERTION PORT-A-CATH;  Surgeon: Erroll Luna, MD;  Location: [facility];  Service: General;  Laterality: N/A;  62    I have reviewed the social history and family history with the patient and they are unchanged from previous note.  ALLERGIES:  is allergic to lisinopril.  MEDICATIONS:  Current Outpatient Medications  Medication Sig Dispense Refill  . amLODipine (NORVASC) 10 MG tablet TAKE 1 TABLET BY MOUTH EVERY DAY 90 tablet 0  . atorvastatin (LIPITOR) 80 MG tablet TAKE 1 TABLET (80 MG TOTAL) BY MOUTH DAILY. 90 tablet 1  . Blood Glucose Monitoring Suppl (ONETOUCH VERIO REFLECT) w/Device KIT 1 kit by Does not apply route in the morning, at noon, and at bedtime. Use as directed to test blood sugar up to three times daily.E11.9 1 kit 0  . cetirizine (ZYRTEC) 10 MG tablet Take 1 tablet (10 mg total) by mouth daily. 30 tablet 0  . cloNIDine (CATAPRES) 0.1 MG tablet Take 1 tablet (0.1 mg total) by mouth at bedtime as needed. 90 tablet 1  . clotrimazole (CLOTRIMAZOLE AF) 1 % cream Apply 1 application topically 2 (two) times daily. 30 g 1  . cyclobenzaprine (FLEXERIL) 10 MG tablet Take 10 mg by mouth 3 (three) times daily as needed for muscle spasms.    . dapagliflozin propanediol (FARXIGA) 10 MG TABS tablet Take 1 tablet (10 mg total) by mouth daily before breakfast. 90 tablet 1  . dexamethasone (DECADRON) 4 MG tablet TAKE 2 TABLETS  ONCE A DAY FOR 3 DAYS AFTER CARBOPLATIN AND AC CHEMOTHERAPY. TAKE WITH FOOD. 30 tablet 1  . ergocalciferol (DRISDOL) 1.25 MG (50000 UT) capsule Take 1 capsule (50,000 Units total) by mouth once a week. 12 capsule 0  . glipiZIDE (GLUCOTROL) 5 MG tablet TAKE 1 TABLET BY MOUTH DAILY (Patient taking differently: Take 5 mg by mouth daily before breakfast.) 90 tablet 1  . glucose blood (ONETOUCH VERIO) test strip Use as directed to test blood sugar up to three  times daily. E11.9 100 each 6  . Lancets (ONETOUCH DELICA PLUS [id number]) MISC 1 Device by Does not apply route in the morning, at noon, and at bedtime. Use as directed to test blood sugar up to three times daily. E11.9 100 each 6  . lidocaine-prilocaine (EMLA) cream APPLY TO AFFECTED AREA AS DIRECTED 30 g 3  . LORazepam (ATIVAN) 0.5 MG tablet Take 1 tablet by mouth at bedtime. 30 tablet 0  . LORazepam (ATIVAN) 0.5 MG tablet Place 1 tablet (0.5 mg total) under the tongue 2 (two) times daily as needed (Nausea). 40 tablet 0  . metFORMIN (GLUCOPHAGE) 500 MG tablet TAKE 1 TABLET BY MOUTH TWICE DAILY 180 tablet 1  . naproxen (NAPROSYN) 500 MG tablet Take 1 tablet (500 mg total) by mouth 2 (two) times daily with a meal. 30 tablet 1  . omeprazole (PRILOSEC) 20 MG capsule Take 1 capsule (20 mg total) by mouth daily. 30 capsule 0  . oxyCODONE (OXY IR/ROXICODONE) 5 MG immediate release tablet Take 1 tablet (5 mg total) by mouth every 6 (six) hours as needed for severe pain. 15 tablet 0  . polyethylene glycol (MIRALAX / GLYCOLAX) 17 g packet TAKE 17 G BY MOUTH DAILY. 14 each 0  . prochlorperazine (COMPAZINE) 10 MG tablet TAKE 1 TABLET BY MOUTH EVERY 6 HOURS AS NEEDED FOR NAUSEA OR VOMITING 30 tablet 1  . Ascorbic Acid (VITAMIN C) 1000 MG tablet Take 1 tablet (1,000 mg total) by mouth daily. 30 tablet 0  . Omega-3 1000 MG CAPS Take 1 capsule (1,000 mg total) by mouth daily. 30 capsule 0  . vitamin B-12 (CYANOCOBALAMIN) 100 MCG tablet Take 1 tablet (100 mcg total) by mouth daily. 30 tablet 0   No current facility-administered medications for this visit.    PHYSICAL EXAMINATION: ECOG PERFORMANCE STATUS: 1 - Symptomatic but completely ambulatory  Vitals:   03/07/21 1036  BP: 108/77  Pulse: 99  Resp: 19  Temp: 98 F (36.7 C)  SpO2: 100%   Filed Weights   03/07/21 1036  Weight: 130 lb 14.4 oz (59.4 kg)    GENERAL:alert, no distress and comfortable SKIN: no rash  EYES:  sclera clear OROPHARYNX:  no thrush, ulcers, oral petechia or obvious gingival abnormalities  LUNGS:  normal breathing effort HEART:  no lower extremity edema NEURO: alert & oriented x 3 with fluent speech, no focal motor/sensory deficits. Intact peripheral vibratory sense over the toes per tuning fork exam PAC without erythema  Breast exam deferred   LABORATORY DATA:  I have reviewed the data as listed CBC Latest Ref Rng & Units 03/07/2021 02/28/2021 02/21/2021  WBC 4.0 - 10.5 K/uL 3.0(L) 5.6 3.5(L)  Hemoglobin 12.0 - 15.0 g/dL 11.5(L) 12.1 12.2  Hematocrit 36.0 - 46.0 % 34.6(L) 38.5 38.0  Platelets 150 - 400 K/uL 229 183 293     CMP Latest Ref Rng & Units 03/07/2021 02/28/2021 02/21/2021  Glucose 70 - 99 mg/dL 115(H) 75 74  BUN 6 - 20 mg/dL 16  14 16  Creatinine 0.44 - 1.00 mg/dL 0.64 0.63 0.61  Sodium 135 - 145 mmol/L 141 139 143  Potassium 3.5 - 5.1 mmol/L 3.7 3.8 3.7  Chloride 98 - 111 mmol/L 102 105 105  CO2 22 - 32 mmol/L 27 24 29   Calcium 8.9 - 10.3 mg/dL 9.4 9.7 9.3  Total Protein 6.5 - 8.1 g/dL 7.3 7.6 7.1  Total Bilirubin 0.3 - 1.2 mg/dL 0.6 <0.2(L) 0.3  Alkaline Phos 38 - 126 U/L 66 69 55  AST 15 - 41 U/L 14(L) 11(L) 12(L)  ALT 0 - 44 U/L 14 11 12       RADIOGRAPHIC STUDIES: I have personally reviewed the radiological images as listed and agreed with the findings in the report. No results found.    ASSESSMENT & PLAN: 55 yo female   1. 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 Baseline echo showed EF 60-65% -01/31/21 genetics: negative  -began neoadjuvant chemotherapy consisting of pembrolizumab given day 1 together with carboplatin and paclitaxel, with paclitaxel given on day 8 and 15 with filgrastim days 16, 17, 18 q21 days x4, to be followed by AC q3 weeks x4 with pembro on day 1. -her treatment plan includes  definitive surgery and adjuvant radiation to follow   Disposition:  Rebecca [name] appears stable, she is s/p C2D1 carbo/taxol/pembro day 1, and taxol days 1, 8, 15 q21 days with filgrastim on days 16, 17, 18. She tolerates chemo well with fatigue and mild nausea. Side effects are well managed with supportive meds at home. She is able to recover and function well.   She has early signs of peripheral neuropathy in her feet, exam is normal. Case discussed with Dr. Jana Hakim who is recommending to hold C2D8 taxol today.   Labs reviewed, ANC 1.1. We discussed infection precautions.   She has concerns about her nutrition and weight on chemo, I referred her to nutrition for DM education and management. In the meantime I recommend she try glucerna 1-2 times daily.  She will return in 1 week for f/up with Dr. Jana Hakim and consideration to resume chemo.    Orders Placed This Encounter  Procedures  . Ambulatory Referral to [facility]    Referral Priority:   Urgent    Referral Type:   Consultation    Referral Reason:   Specialty Services Required    Number of Visits Requested:   1   All questions were answered. The patient knows to call the clinic with any problems, questions or concerns. No barriers to learning were detected with in-person Arabic interpreter present for this encounter. Total encounter time was 30 minutes.     Alla Feeling, NP 03/07/21

## 2021-03-06 NOTE — Telephone Encounter (Signed)
Reached out to patient via pac[employer] interpreters to follow up for any needs or concerns. She had some questions regarding vitamins she should be taking.  She was asking if it was ok to take Vitamin D, C and fish oil. Informed her this was ok but if she has any other supplements that she has questions about to write these down and bring with her to the appointment tomorrow.  She also asked about disability paperwork. I see where her FMLA was completed 5/3 but don't see disability. I'll have NP ask her at her appointment tomorrow to verify. Made sure patient had our contact information for future needs.

## 2021-03-07 ENCOUNTER — Ambulatory Visit: Payer: 59

## 2021-03-07 ENCOUNTER — Encounter: Payer: Self-pay | Admitting: Nurse Practitioner

## 2021-03-07 ENCOUNTER — Other Ambulatory Visit: Payer: Self-pay

## 2021-03-07 ENCOUNTER — Inpatient Hospital Stay (HOSPITAL_BASED_OUTPATIENT_CLINIC_OR_DEPARTMENT_OTHER): Payer: 59 | Admitting: Nurse Practitioner

## 2021-03-07 ENCOUNTER — Telehealth: Payer: Self-pay | Admitting: Dietician

## 2021-03-07 ENCOUNTER — Inpatient Hospital Stay: Payer: 59

## 2021-03-07 VITALS — BP 108/77 | HR 99 | Temp 98.0°F | Resp 19 | Ht 63.0 in | Wt 130.9 lb

## 2021-03-07 DIAGNOSIS — Z170 Estrogen receptor positive status [ER+]: Secondary | ICD-10-CM

## 2021-03-07 DIAGNOSIS — C50311 Malignant neoplasm of lower-inner quadrant of right female breast: Secondary | ICD-10-CM

## 2021-03-07 DIAGNOSIS — Z95828 Presence of other vascular implants and grafts: Secondary | ICD-10-CM

## 2021-03-07 DIAGNOSIS — Z5112 Encounter for antineoplastic immunotherapy: Secondary | ICD-10-CM | POA: Diagnosis not present

## 2021-03-07 LAB — CMP (CANCER CENTER ONLY)
ALT: 14 U/L (ref 0–44)
AST: 14 U/L — ABNORMAL LOW (ref 15–41)
Albumin: 3.8 g/dL (ref 3.5–5.0)
Alkaline Phosphatase: 66 U/L (ref 38–126)
Anion gap: 12 (ref 5–15)
BUN: 16 mg/dL (ref 6–20)
CO2: 27 mmol/L (ref 22–32)
Calcium: 9.4 mg/dL (ref 8.9–10.3)
Chloride: 102 mmol/L (ref 98–111)
Creatinine: 0.64 mg/dL (ref 0.44–1.00)
GFR, Estimated: >60 mL/min (ref >60)
Glucose, Bld: 115 mg/dL — ABNORMAL HIGH (ref 70–99)
Potassium: 3.7 mmol/L (ref 3.5–5.1)
Sodium: 141 mmol/L (ref 135–145)
Total Bilirubin: 0.6 mg/dL (ref 0.3–1.2)
Total Protein: 7.3 g/dL (ref 6.5–8.1)

## 2021-03-07 LAB — CBC WITH DIFFERENTIAL (CANCER CENTER ONLY)
Abs Immature Granulocytes: 0.02 10*3/uL (ref 0.00–0.07)
Basophils Absolute: 0 10*3/uL (ref 0.0–0.1)
Basophils Relative: 1 %
Eosinophils Absolute: 0 10*3/uL (ref 0.0–0.5)
Eosinophils Relative: 1 %
HCT: 34.6 % — ABNORMAL LOW (ref 36.0–46.0)
Hemoglobin: 11.5 g/dL — ABNORMAL LOW (ref 12.0–15.0)
Immature Granulocytes: 1 %
Lymphocytes Relative: 56 %
Lymphs Abs: 1.7 10*3/uL (ref 0.7–4.0)
MCH: 26.9 pg (ref 26.0–34.0)
MCHC: 33.2 g/dL (ref 30.0–36.0)
MCV: 80.8 fL (ref 80.0–100.0)
Monocytes Absolute: 0.2 10*3/uL (ref 0.1–1.0)
Monocytes Relative: 5 %
Neutro Abs: 1.1 10*3/uL — ABNORMAL LOW (ref 1.7–7.7)
Neutrophils Relative %: 36 %
Platelet Count: 229 10*3/uL (ref 150–400)
RBC: 4.28 MIL/uL (ref 3.87–5.11)
RDW: 13.4 % (ref 11.5–15.5)
WBC Count: 3 10*3/uL — ABNORMAL LOW (ref 4.0–10.5)
nRBC: 0 % (ref 0.0–0.2)

## 2021-03-07 MED ORDER — SODIUM CHLORIDE 0.9% FLUSH
10.0000 mL | Freq: Once | INTRAVENOUS | Status: AC
  Administered 2021-03-07: 10 mL
  Filled 2021-03-07: qty 10

## 2021-03-07 MED ORDER — OMEGA-3 1000 MG PO CAPS: 1000.0000 mg | ORAL_CAPSULE | Freq: Every day | ORAL | 0 refills | Status: AC

## 2021-03-07 MED ORDER — VITAMIN B-12 100 MCG PO TABS: 100.0000 ug | ORAL_TABLET | Freq: Every day | ORAL | 0 refills | Status: AC

## 2021-03-07 MED ORDER — VITAMIN C 1000 MG PO TABS: 1000.0000 mg | ORAL_TABLET | Freq: Every day | ORAL | 0 refills | Status: AC

## 2021-03-07 NOTE — Telephone Encounter (Signed)
Scheduled appt per 5/17 sch msg. Pt aware.  

## 2021-03-08 ENCOUNTER — Inpatient Hospital Stay: Payer: 59 | Admitting: Dietician

## 2021-03-08 NOTE — Progress Notes (Signed)
Nutrition Assessment   Reason for Assessment: Patient request   ASSESSMENT: 55 year old female with right breast cancer. She is receiving neoadjuvant chemotherapy with pembrolizumab/carboplatin and paclitaxel.  Past medical history of DM2, HLD, arthritis, HLD, HTN  Spoke with patient via telephone with the assistance of interpretor services [phone]). Patient reports she has stopped eating rice, milk, yogurt, and most fruits because she has diabetes and those foods have sugar. Patient eating pomegranate, salads, fish, meat, and chicken. She cooks with butter, olive oil, and ghee. Patient is losing weight and asking what foods she can eat that do not have sugar and will help maintain weights.     Medications: vit C, decadron, glipizide, ativan, metformin, omega 3, miralax, compazine, B12   Labs: 5/18 Glucose 117 11/11/20 A1c 7.1  Anthropometrics: Weight 130 lb 14.4 oz on 5/17 decreased   Height: 5'3" Weight: 130 lb 14.4 oz UBW: 144 lb (01/22) BMI: 23.19   NUTRITION DIAGNOSIS: Unintended weight loss related to cancer, assoictated treatments and food/nutrition knowledge deficit as evidenced by dietary recall and 9% decrease in weight in 4 months  INTERVENTION:  Encouraged eating around the same time each day and eating smaller meals more frequently  Educated on eating balanced meals and snacks by including protein and a fat with carbohydrate Encouraged eating her regular diet as tolerated with addition of protein food at each meal Discussed nutrition supplement to provide added calories and protein Will leave samples of supplements as well as [employer] coupons at registration desk for pick up on 5/24 Email sent to interpretor services to inquire about translating education handouts    MONITORING, EVALUATION, GOAL: Patient will tolerate increased calories and protein to minimize weight loss during treatment    Next Visit: Wednesday June 8 via telephone

## 2021-03-13 ENCOUNTER — Other Ambulatory Visit: Payer: Self-pay | Admitting: Oncology

## 2021-03-13 DIAGNOSIS — Z170 Estrogen receptor positive status [ER+]: Secondary | ICD-10-CM

## 2021-03-13 MED FILL — Dexamethasone Sodium Phosphate Inj 100 MG/10ML: INTRAMUSCULAR | Qty: 1 | Status: AC

## 2021-03-13 NOTE — Progress Notes (Signed)
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 She is accompanied by interpreter (who happens to be the daughter of one of my patients).  Rebecca [name] was started on neoadjuvant chemotherapy on 02/07/2021. This consists of pembrolizumab given day 1 together with carboplatin and paclitaxel, with Taxol given on day 8 and 15, and zarxio or equivalent on days 16,17 and 18, repeated every 21 days x 4.  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  Furthermore she is neutropenic today.  Accordingly the remaining carboplatin/Taxol portion of her treatment is being discontinued.  She will next receive  AC x4, with pembrolizumab each cycle.    Her most recent echocardiogram on 01/31/2021 showed an ejection fraction of 60-65%.   REVIEW OF SYSTEMS: Rebecca [name] tells me the neuropathy is essentially unchanged it involves the hands more than the feet.  It is grade 1 however.  It does not affect her walking or grabbing things.  She has had no other significant side effects to report and specifically there has been no fever rash cough, shortness of breath or change in bowel or bladder habits..  A detailed review of systems today was otherwise stable   COVID 19  VACCINATION STATUS: fully vaccinated [employer]), with booster 12/2020   HISTORY OF CURRENT ILLNESS: From the original intake note:  Rebecca [name] herself palpated a mass in the right axilla. She underwent bilateral diagnostic mammography with tomography and right breast ultrasonography at [facility] on 01/03/2021 showing: breast density category B; two adjacent suspicious right breast masses at 5 o'clock; markedly abnormal right axillary lymph node; no evidence of malignancy on left.  Accordingly on 01/06/2021 she proceeded to biopsy of the right breast area in question. The pathology from this procedure ([id number]) showed: invasive ductal carcinoma, grade 3. Prognostic indicators significant for: estrogen receptor, 10% positive with weak staining intensity and progesterone receptor, 0% negative. Proliferation marker Ki67 at 90%. HER2 equivocal by immunohistochemistry (2+), but negative by fluorescent in situ hybridization with a signals ratio 1.38 and number per cell 3.63.  Cancer Staging Malignant neoplasm of lower-inner quadrant of right breast of female, estrogen receptor positive ([place]) Staging form: Breast, AJCC 8th Edition - Clinical: Stage IIIB (cT2, cN1, cM0, G3, ER-, PR-, HER2-) - Signed by Chauncey Cruel, MD on 01/18/2021 Stage prefix: Initial diagnosis Histologic grading system: 3 grade system  [name] is as detailed below.   PAST MEDICAL HISTORY: Past Medical History:  Diagnosis Date  . Arthritis   . Breast cancer ([place])   . Diabetes mellitus   . Family history of lymphoma 01/18/2021  . Family history of throat cancer 01/18/2021  . Hyperlipidemia   . Hypertension     PAST SURGICAL HISTORY: Past Surgical History:  Procedure Laterality Date  . CESAREAN SECTION    . PORTACATH PLACEMENT N/A 01/26/2021  Procedure: INSERTION PORT-A-CATH;  Surgeon: Erroll Luna, MD;  Location: [facility] OR;  Service: General;  Laterality: N/A;  56    FAMILY  HISTORY: Family History  Problem Relation Age of Onset  . Diabetes Mother   . Heart disease Mother   . Diabetes Father   . Throat cancer Father        d. 10s  . Diabetes Sister   . Diabetes Brother   . Lymphoma Cousin        maternal cousin; d. 36s   Her father died in his 44's and her mother in her 47'S, both from complications of diabetes. [name] has 6 brothers and 1 sister. There is no family history of breast or ovarian cancer to her knowledge.   GYNECOLOGIC HISTORY:  No LMP recorded. Menarche: 55 years old Abie P 0 LMP 10/31/2020 (before that, 06/2020) Contraceptive used from 2017-2018 HRT n/a  Hysterectomy? no BSO? no   SOCIAL HISTORY: (updated 12/2020)  Mckala is originally from [place]. 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 She is divorced. She lives at home with her nephew [name], who is 71 and works for [employer]. She attends [employer] ([employer]).    ADVANCED DIRECTIVES: not in place; at the 01/18/2021 visit she stated her ex-husband [name], who is disabled due to past injuries, ([phone]) would be the person to contact if she became incapacitated [the patient states aside from her nephew she has no other relatives in [place]: Social History   Tobacco Use  . Smoking status: Passive Smoke Exposure - Never Smoker  . Smokeless tobacco: Never Used  Substance Use Topics  . Alcohol use: No  . Drug use: No     Colonoscopy: never done, referral in place  PAP: 12/2020, negative  Bone density: never done   Allergies  Allergen Reactions  . Lisinopril Swelling    Swelling in throat, coughing, difficulty swallowing, could not sleep. Occurred Dec 2020.    Current Outpatient Medications  Medication Sig Dispense Refill  . amLODipine (NORVASC) 10 MG tablet TAKE 1 TABLET BY MOUTH EVERY DAY 90 tablet 0  . Ascorbic Acid (VITAMIN C) 1000 MG tablet Take 1  tablet (1,000 mg total) by mouth daily. 30 tablet 0  . atorvastatin (LIPITOR) 80 MG tablet TAKE 1 TABLET (80 MG TOTAL) BY MOUTH DAILY. 90 tablet 1  . Blood Glucose Monitoring Suppl (ONETOUCH VERIO REFLECT) w/Device KIT 1 kit by Does not apply route in the morning, at noon, and at bedtime. Use as directed to test blood sugar up to three times daily.E11.9 1 kit 0  . cetirizine (ZYRTEC) 10 MG tablet Take 1 tablet (10 mg total) by mouth daily. 30 tablet 0  . cloNIDine (CATAPRES) 0.1 MG tablet Take 1 tablet (0.1 mg total) by mouth at bedtime as needed. 90 tablet 1  . clotrimazole (CLOTRIMAZOLE AF) 1 % cream Apply 1 application topically 2 (two) times daily. 30 g 1  . cyclobenzaprine (FLEXERIL) 10 MG tablet Take 10 mg by mouth 3 (three) times daily as needed for muscle spasms.    . dapagliflozin propanediol (FARXIGA) 10 MG TABS tablet Take 1 tablet (10 mg total) by mouth daily before breakfast. 90 tablet 1  . dexamethasone (DECADRON) 4 MG tablet TAKE 2 TABLETS ONCE A DAY FOR 3 DAYS AFTER CARBOPLATIN AND AC CHEMOTHERAPY. TAKE WITH FOOD. 30 tablet 1  . ergocalciferol (DRISDOL) 1.25 MG (50000 UT) capsule Take  1 capsule (50,000 Units total) by mouth once a week. 12 capsule 0  . glipiZIDE (GLUCOTROL) 5 MG tablet TAKE 1 TABLET BY MOUTH DAILY (Patient taking differently: Take 5 mg by mouth daily before breakfast.) 90 tablet 1  . glucose blood (ONETOUCH VERIO) test strip Use as directed to test blood sugar up to three times daily. E11.9 100 each 6  . Lancets (ONETOUCH DELICA PLUS [id number]) MISC 1 Device by Does not apply route in the morning, at noon, and at bedtime. Use as directed to test blood sugar up to three times daily. E11.9 100 each 6  . lidocaine-prilocaine (EMLA) cream APPLY TO AFFECTED AREA AS DIRECTED 30 g 3  . LORazepam (ATIVAN) 0.5 MG tablet Take 1 tablet by mouth at bedtime. 30 tablet 0  . LORazepam (ATIVAN) 0.5 MG tablet Place 1 tablet (0.5 mg total) under the tongue 2 (two) times daily as needed  (Nausea). 40 tablet 0  . metFORMIN (GLUCOPHAGE) 500 MG tablet TAKE 1 TABLET BY MOUTH TWICE DAILY 180 tablet 1  . naproxen (NAPROSYN) 500 MG tablet Take 1 tablet (500 mg total) by mouth 2 (two) times daily with a meal. 30 tablet 1  . Omega-3 1000 MG CAPS Take 1 capsule (1,000 mg total) by mouth daily. 30 capsule 0  . omeprazole (PRILOSEC) 20 MG capsule Take 1 capsule (20 mg total) by mouth daily. 30 capsule 0  . oxyCODONE (OXY IR/ROXICODONE) 5 MG immediate release tablet Take 1 tablet (5 mg total) by mouth every 6 (six) hours as needed for severe pain. 15 tablet 0  . polyethylene glycol (MIRALAX / GLYCOLAX) 17 g packet TAKE 17 G BY MOUTH DAILY. 14 each 0  . prochlorperazine (COMPAZINE) 10 MG tablet TAKE 1 TABLET BY MOUTH EVERY 6 HOURS AS NEEDED FOR NAUSEA OR VOMITING 30 tablet 1  . vitamin B-12 (CYANOCOBALAMIN) 100 MCG tablet Take 1 tablet (100 mcg total) by mouth daily. 30 tablet 0   No current facility-administered medications for this visit.    OBJECTIVE: Rebecca [name] woman who appears stated age  55:   03/14/21 1040  BP: 120/76  Pulse: 96  Resp: 18  Temp: 98.1 F (36.7 C)  SpO2: 100%     Body mass index is 23.9 kg/m.   Wt Readings from Last 3 Encounters:  03/14/21 134 lb 14.4 oz (61.2 kg)  03/07/21 130 lb 14.4 oz (59.4 kg)  02/28/21 132 lb 8 oz (60.1 kg)      ECOG FS:1 - Symptomatic but completely ambulatory  Sclerae unicteric, EOMs intact Wearing a mask No cervical or supraclavicular adenopathy Lungs no rales or rhonchi Heart regular rate and rhythm Abd soft, nontender, positive bowel sounds MSK no focal spinal tenderness, no upper extremity lymphedema Neuro: nonfocal, well oriented, appropriate affect Breasts: I do not palpate a mass in the right breast.  The left breast and both axillae are benign   LAB RESULTS:  CMP     Component Value Date/Time   NA 139 03/14/2021 1003   NA 140 12/27/2020 1123   K 3.8 03/14/2021 1003   CL 103 03/14/2021 1003   CO2 25  03/14/2021 1003   GLUCOSE 76 03/14/2021 1003   BUN 10 03/14/2021 1003   BUN 11 12/27/2020 1123   CREATININE 0.56 03/14/2021 1003   CREATININE 0.50 11/02/2016 1102   CALCIUM 9.3 03/14/2021 1003   PROT 7.3 03/14/2021 1003   PROT 7.5 12/27/2020 1123   ALBUMIN 3.8 03/14/2021 1003   ALBUMIN 4.7 12/27/2020 1123  AST 23 03/14/2021 1003   ALT 19 03/14/2021 1003   ALKPHOS 66 03/14/2021 1003   BILITOT <0.2 (L) 03/14/2021 1003   GFRNONAA >60 03/14/2021 1003   GFRNONAA >89 01/16/2016 1006   GFRAA 118 06/02/2020 0942   GFRAA >89 01/16/2016 1006    No results found for: TOTALPROTELP, ALBUMINELP, A1GS, A2GS, BETS, BETA2SER, GAMS, MSPIKE, SPEI  Lab Results  Component Value Date   WBC 2.6 (L) 03/14/2021   NEUTROABS 0.6 (L) 03/14/2021   HGB 11.5 (L) 03/14/2021   HCT 35.8 (L) 03/14/2021   MCV 82.3 03/14/2021   PLT 324 03/14/2021    No results found for: LABCA2  No components found for: [id number]  No results for input(s): INR in the last 168 hours.  No results found for: LABCA2  No results found for: LEX517  No results found for: GYF749  No results found for: SWH675  No results found for: CA2729  No components found for: HGQUANT  No results found for: CEA1 / No results found for: CEA1   No results found for: AFPTUMOR  No results found for: CHROMOGRNA  No results found for: KPAFRELGTCHN, LAMBDASER, KAPLAMBRATIO (kappa/lambda light chains)  No results found for: HGBA, HGBA2QUANT, HGBFQUANT, HGBSQUAN (Hemoglobinopathy evaluation)   No results found for: LDH  No results found for: IRON, TIBC, IRONPCTSAT (Iron and TIBC)  No results found for: FERRITIN  Urinalysis    Component Value Date/Time   COLORURINE YELLOW 06/24/2007 0116   APPEARANCEUR CLOUDY (A) 06/24/2007 0116   LABSPEC 1.025 09/18/2020 1125   PHURINE 5.5 09/18/2020 1125   GLUCOSEU 100 (A) 09/18/2020 1125   HGBUR TRACE (A) 09/18/2020 1125   BILIRUBINUR NEGATIVE 09/18/2020 1125   KETONESUR NEGATIVE  09/18/2020 1125   PROTEINUR NEGATIVE 09/18/2020 1125   UROBILINOGEN 0.2 09/18/2020 1125   NITRITE NEGATIVE 09/18/2020 1125   LEUKOCYTESUR NEGATIVE 09/18/2020 1125    STUDIES: No results found.   ELIGIBLE FOR AVAILABLE RESEARCH PROTOCOL: no  ASSESSMENT: 55 y.o. [place] woman status post right breast lower inner quadrant biopsy 01/06/2021 for a clinically T2 N1, stage IIIB functionally triple negative invasive ductal carcinoma, grade 3, with and MIB-1 of 90%  (a) chest CT scan 01/29/2021 shows no evidence of metastatic disease  (b) bone scan 02/01/2021 shows no evidence of metastatic disease  (1) neoadjuvant chemotherapy will consist of pembrolizumab given day 1 together with carboplatin and paclitaxel, with Taxol given on day 8 and 15, and fill gastrium on days 16,17 and 18, repeated every 21 days x 4, followed by doxorubicin and cyclophosphamide every 21 days x 4 also given with pembrolizumab on day 1  (a) carboplatin/paclitaxel discontinued after the cycle 2-day 8 dose with neuropathy developing  (b) doxorubicin/cyclophosphamide to start 03/21/2021  (c) pembrolizumab continuing to total 1 year  (2) definitive surgery to follow  (3) adjuvant radiation  (4) genetics testing  Negative hereditary cancer genetic testing: no pathogenic variants detected in [facility] CancerNext-Expanded + RNAinsight Panel.  The report date is January 31, 2021.  The CancerNext-Expanded gene panel offered by [facility] and includes sequencing, rearrangement, and RNA analysis for the following 77 genes: AIP, ALK, APC, ATM, AXIN2, BAP1, BARD1, BLM, BMPR1A, BRCA1, BRCA2, BRIP1, CDC73, CDH1, CDK4, CDKN1B, CDKN2A, CHEK2, CTNNA1, DICER1, FANCC, FH, FLCN, GALNT12, KIF1B, LZTR1, MAX, MEN1, MET, MLH1, MSH2, MSH3, MSH6, MUTYH, NBN, NF1, NF2, NTHL1, PALB2, PHOX2B, PMS2, POT1, PRKAR1A, PTCH1, PTEN, RAD51C, RAD51D, RB1, RECQL, RET, SDHA, SDHAF2, SDHB, SDHC, SDHD, SMAD4, SMARCA4, SMARCB1, SMARCE1, STK11, SUFU, TMEM127, TP53,  TSC1, TSC2,  VHL and XRCC2 (sequencing and deletion/duplication); EGFR, EGLN1, HOXB13, KIT, MITF, PDGFRA, POLD1, and POLE (sequencing only); EPCAM and GREM1 (deletion/duplication only).     PLAN: Rebecca [name] had a good early response clinically to the carboplatin and paclitaxel, but has developed neuropathy and we are discontinuing those agents.  She is also neutropenic today.  We have added 1 additional Zarxio dose and hopefully by 03/21/2021 she will have recovered her counts enough that we do not need to interrupt treatment.  We are now moving to the doxorubicin/cyclophosphamide portion of her treatment.  She has a good understanding of the possible toxicities side effects and complications of these agents and had an adequate echocardiogram April 2022.  When she can pleats these next 4 cycles she will have a repeat breast MRI and at that time we can decide whether it is worth trying to return to carboplatin/paclitaxel for 2 additional cycles, or move directly to surgery  Odean has a good understanding of the overall plan.  She agrees with it.  She knows to call for any issue that may develop before the next visit  Total encounter time 35 minutes.Sarajane Jews C. Jajuan Skoog, MD 03/14/2021 7:00 PM Medical Oncology and Hematology [facility] [place], [place] [address] Tel. [phone]    Fax. [phone]   This document serves as a record of services personally performed by Lurline Del, MD. It was created on his behalf by Wilburn Mylar, a trained medical scribe. The creation of this record is based on the scribe's personal observations and the provider's statements to them.   I, Lurline Del MD, have reviewed the above documentation for accuracy and completeness, and I agree with the above.   *Total Encounter Time as defined by the Centers for Medicare and Medicaid Services includes, in addition to the face-to-face time of a patient visit (documented in the  note above) non-face-to-face time: obtaining and reviewing outside history, ordering and reviewing medications, tests or procedures, care coordination (communications with other health care professionals or caregivers) and documentation in the medical record.

## 2021-03-14 ENCOUNTER — Other Ambulatory Visit: Payer: Self-pay

## 2021-03-14 ENCOUNTER — Inpatient Hospital Stay: Payer: 59

## 2021-03-14 ENCOUNTER — Other Ambulatory Visit: Payer: Self-pay | Admitting: *Deleted

## 2021-03-14 ENCOUNTER — Encounter: Payer: Self-pay | Admitting: Licensed Clinical Social Worker

## 2021-03-14 ENCOUNTER — Inpatient Hospital Stay (HOSPITAL_BASED_OUTPATIENT_CLINIC_OR_DEPARTMENT_OTHER): Payer: 59 | Admitting: Oncology

## 2021-03-14 ENCOUNTER — Encounter: Payer: Self-pay | Admitting: *Deleted

## 2021-03-14 DIAGNOSIS — Z95828 Presence of other vascular implants and grafts: Secondary | ICD-10-CM

## 2021-03-14 DIAGNOSIS — C50311 Malignant neoplasm of lower-inner quadrant of right female breast: Secondary | ICD-10-CM

## 2021-03-14 DIAGNOSIS — Z5112 Encounter for antineoplastic immunotherapy: Secondary | ICD-10-CM | POA: Diagnosis not present

## 2021-03-14 DIAGNOSIS — Z170 Estrogen receptor positive status [ER+]: Secondary | ICD-10-CM

## 2021-03-14 LAB — CBC WITH DIFFERENTIAL (CANCER CENTER ONLY)
Abs Immature Granulocytes: 0.03 10*3/uL (ref 0.00–0.07)
Basophils Absolute: 0 10*3/uL (ref 0.0–0.1)
Basophils Relative: 1 %
Eosinophils Absolute: 0 10*3/uL (ref 0.0–0.5)
Eosinophils Relative: 1 %
HCT: 35.8 % — ABNORMAL LOW (ref 36.0–46.0)
Hemoglobin: 11.5 g/dL — ABNORMAL LOW (ref 12.0–15.0)
Immature Granulocytes: 1 %
Lymphocytes Relative: 53 %
Lymphs Abs: 1.4 10*3/uL (ref 0.7–4.0)
MCH: 26.4 pg (ref 26.0–34.0)
MCHC: 32.1 g/dL (ref 30.0–36.0)
MCV: 82.3 fL (ref 80.0–100.0)
Monocytes Absolute: 0.5 10*3/uL (ref 0.1–1.0)
Monocytes Relative: 20 %
Neutro Abs: 0.6 10*3/uL — ABNORMAL LOW (ref 1.7–7.7)
Neutrophils Relative %: 24 %
Platelet Count: 324 10*3/uL (ref 150–400)
RBC: 4.35 MIL/uL (ref 3.87–5.11)
RDW: 15.4 % (ref 11.5–15.5)
WBC Count: 2.6 10*3/uL — ABNORMAL LOW (ref 4.0–10.5)
nRBC: 0 % (ref 0.0–0.2)

## 2021-03-14 LAB — CMP (CANCER CENTER ONLY)
ALT: 19 U/L (ref 0–44)
AST: 23 U/L (ref 15–41)
Albumin: 3.8 g/dL (ref 3.5–5.0)
Alkaline Phosphatase: 66 U/L (ref 38–126)
Anion gap: 11 (ref 5–15)
BUN: 10 mg/dL (ref 6–20)
CO2: 25 mmol/L (ref 22–32)
Calcium: 9.3 mg/dL (ref 8.9–10.3)
Chloride: 103 mmol/L (ref 98–111)
Creatinine: 0.56 mg/dL (ref 0.44–1.00)
GFR, Estimated: >60 mL/min (ref >60)
Glucose, Bld: 76 mg/dL (ref 70–99)
Potassium: 3.8 mmol/L (ref 3.5–5.1)
Sodium: 139 mmol/L (ref 135–145)
Total Bilirubin: <0.2 mg/dL — ABNORMAL LOW (ref 0.3–1.2)
Total Protein: 7.3 g/dL (ref 6.5–8.1)

## 2021-03-14 MED ORDER — SODIUM CHLORIDE 0.9% FLUSH
10.0000 mL | Freq: Once | INTRAVENOUS | Status: AC
  Administered 2021-03-14: 10 mL
  Filled 2021-03-14: qty 10

## 2021-03-14 MED ORDER — ANTICOAGULANT SODIUM CITRATE 4% (200MG/5ML) IV SOLN
5.0000 mL | Freq: Once | Status: AC
Start: 2021-03-14 — End: 2021-03-14
  Administered 2021-03-14: 5 mL
  Filled 2021-03-14: qty 5

## 2021-03-14 MED ORDER — FILGRASTIM-SNDZ 300 MCG/0.5ML IJ SOSY
300.0000 ug | PREFILLED_SYRINGE | Freq: Once | INTRAMUSCULAR | Status: AC
  Administered 2021-03-14: 300 ug via SUBCUTANEOUS

## 2021-03-14 MED ORDER — SODIUM CHLORIDE 0.9% FLUSH
10.0000 mL | Freq: Once | INTRAVENOUS | Status: AC
Start: 2021-03-14 — End: 2021-03-14
  Administered 2021-03-14: 10 mL
  Filled 2021-03-14: qty 10

## 2021-03-14 NOTE — Progress Notes (Signed)
[facility] CSW Progress Note  Holiday representative met with patient in infusion with help of pt's friend's daughter interpreting.  Provided 3rd set of [employer]. Gave printout and explained what [facility] has approved for assistance for patient for next 3 months.   Patient stated that she has some medical bills that she meant to bring in to send to [facility] [place] but forgot them today. She will try to bring them at another appt this week.    Christeen Douglas , LCSW

## 2021-03-14 NOTE — Progress Notes (Signed)
Per Lurline Del, MD pt to receive injection only today. Pt agreeable

## 2021-03-14 NOTE — Patient Instructions (Signed)
[facility] MEDICAL ONCOLOGY  Discharge Instructions: Thank you for choosing [facility] to provide your oncology and hematology care.   If you have a lab appointment with the Cancer Center, please go directly to the Cancer Center and check in at the registration area.   Wear comfortable clothing and clothing appropriate for easy access to any Portacath or PICC line.   We strive to give you quality time with your provider. You may need to reschedule your appointment if you arrive late (15 or more minutes).  Arriving late affects you and other patients whose appointments are after yours.  Also, if you miss three or more appointments without notifying the office, you may be dismissed from the clinic at the provider's discretion.      For prescription refill requests, have your pharmacy contact our office and allow 72 hours for refills to be completed.       To help prevent nausea and vomiting after your treatment, we encourage you to take your nausea medication as directed.  BELOW ARE SYMPTOMS THAT SHOULD BE REPORTED IMMEDIATELY: *FEVER GREATER THAN 100.4 F (38 C) OR HIGHER *CHILLS OR SWEATING *NAUSEA AND VOMITING THAT IS NOT CONTROLLED WITH YOUR NAUSEA MEDICATION *UNUSUAL SHORTNESS OF BREATH *UNUSUAL BRUISING OR BLEEDING *URINARY PROBLEMS (pain or burning when urinating, or frequent urination) *BOWEL PROBLEMS (unusual diarrhea, constipation, pain near the anus) TENDERNESS IN MOUTH AND THROAT WITH OR WITHOUT PRESENCE OF ULCERS (sore throat, sores in mouth, or a toothache) UNUSUAL RASH, SWELLING OR PAIN  UNUSUAL VAGINAL DISCHARGE OR ITCHING   Items with * indicate a potential emergency and should be followed up as soon as possible or go to the Emergency Department if any problems should occur.  Please show the CHEMOTHERAPY ALERT CARD or IMMUNOTHERAPY ALERT CARD at check-in to the Emergency Department and triage nurse.  Should you have questions after your  visit or need to cancel or reschedule your appointment, please contact [facility] MEDICAL ONCOLOG[facility]  Dept: [phone]  and follow the prompts.  Office hours are 8:00 a.m. to 4:30 p.m. Monday - Friday. Please note that voicemails left after 4:00 p.m. may not be returned until the following business day.  We are closed weekends and major holidays. You have access to a nurse at all times for urgent questions. Please call the main number to the clinic Dept: [phone] and follow the prompts.   For any non-urgent questions, you may also contact your provider using MyChart. We now offer e-Visits for anyone 18 and older to request care online for non-urgent symptoms. For details visit [email or web address].   Also download the MyChart app! Go to the app store, search "MyChart", open the app, select [facility], and log in with your MyChart username and password.  Due to Covid, a mask is required upon entering the hospital/clinic. If you do not have a mask, one will be given to you upon arrival. For doctor visits, patients may have 1 support person aged 18 or older with them. For treatment visits, patients cannot have anyone with them due to current Covid guidelines and our immunocompromised population.   

## 2021-03-15 ENCOUNTER — Telehealth: Payer: Self-pay | Admitting: Oncology

## 2021-03-15 ENCOUNTER — Inpatient Hospital Stay: Payer: 59

## 2021-03-15 VITALS — BP 122/74 | HR 109 | Temp 97.8°F | Resp 18

## 2021-03-15 DIAGNOSIS — Z170 Estrogen receptor positive status [ER+]: Secondary | ICD-10-CM

## 2021-03-15 DIAGNOSIS — Z5112 Encounter for antineoplastic immunotherapy: Secondary | ICD-10-CM | POA: Diagnosis not present

## 2021-03-15 MED ORDER — FILGRASTIM-SNDZ 300 MCG/0.5ML IJ SOSY
300.0000 ug | PREFILLED_SYRINGE | Freq: Once | INTRAMUSCULAR | Status: AC
  Administered 2021-03-15: 300 ug via SUBCUTANEOUS

## 2021-03-15 MED ORDER — FILGRASTIM-SNDZ 300 MCG/0.5ML IJ SOSY
PREFILLED_SYRINGE | INTRAMUSCULAR | Status: AC
  Filled 2021-03-15: qty 0.5

## 2021-03-15 NOTE — Telephone Encounter (Signed)
Scheduled per 5/24 los. Pt will receive an updated appt calendar

## 2021-03-16 ENCOUNTER — Other Ambulatory Visit: Payer: Self-pay

## 2021-03-16 ENCOUNTER — Inpatient Hospital Stay: Payer: 59

## 2021-03-16 DIAGNOSIS — Z5112 Encounter for antineoplastic immunotherapy: Secondary | ICD-10-CM | POA: Diagnosis not present

## 2021-03-16 DIAGNOSIS — Z170 Estrogen receptor positive status [ER+]: Secondary | ICD-10-CM

## 2021-03-16 MED ORDER — FILGRASTIM-SNDZ 300 MCG/0.5ML IJ SOSY
300.0000 ug | PREFILLED_SYRINGE | Freq: Once | INTRAMUSCULAR | Status: AC
  Administered 2021-03-16: 300 ug via SUBCUTANEOUS

## 2021-03-16 MED ORDER — FILGRASTIM-SNDZ 300 MCG/0.5ML IJ SOSY
PREFILLED_SYRINGE | INTRAMUSCULAR | Status: AC
  Filled 2021-03-16: qty 0.5

## 2021-03-16 NOTE — Patient Instructions (Signed)

## 2021-03-17 ENCOUNTER — Inpatient Hospital Stay: Payer: 59

## 2021-03-17 ENCOUNTER — Other Ambulatory Visit: Payer: Self-pay

## 2021-03-17 VITALS — BP 133/78 | HR 97 | Temp 98.2°F | Resp 18

## 2021-03-17 DIAGNOSIS — Z5112 Encounter for antineoplastic immunotherapy: Secondary | ICD-10-CM | POA: Diagnosis not present

## 2021-03-17 DIAGNOSIS — C50311 Malignant neoplasm of lower-inner quadrant of right female breast: Secondary | ICD-10-CM

## 2021-03-17 DIAGNOSIS — Z170 Estrogen receptor positive status [ER+]: Secondary | ICD-10-CM

## 2021-03-17 MED ORDER — FILGRASTIM-SNDZ 300 MCG/0.5ML IJ SOSY
PREFILLED_SYRINGE | INTRAMUSCULAR | Status: AC
  Filled 2021-03-17: qty 0.5

## 2021-03-17 MED ORDER — FILGRASTIM-SNDZ 300 MCG/0.5ML IJ SOSY
300.0000 ug | PREFILLED_SYRINGE | Freq: Once | INTRAMUSCULAR | Status: AC
  Administered 2021-03-17: 300 ug via SUBCUTANEOUS

## 2021-03-17 MED FILL — Fosaprepitant Dimeglumine For IV Infusion 150 MG (Base Eq): INTRAVENOUS | Qty: 5 | Status: AC

## 2021-03-17 MED FILL — Dexamethasone Sodium Phosphate Inj 100 MG/10ML: INTRAMUSCULAR | Qty: 1 | Status: AC

## 2021-03-20 NOTE — Progress Notes (Signed)
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 She is accompanied by interpreter.  She was scheduled for her first cycle of cyclophosphamide and doxorubicin today, but arrived an hour late, her treatment was canceled and had to be rescheduled.   Her most recent echocardiogram on 01/31/2021 showed an ejection fraction of 60-65%.   REVIEW OF SYSTEMS: Rebecca [name] reports no new symptoms or problems and tells me she is ready to proceed with therapy as soon as we can schedule it.  A detailed review of systems was otherwise stable   COVID 19 VACCINATION STATUS: fully vaccinated [employer]), with booster 12/2020   HISTORY OF CURRENT ILLNESS: From the original intake note:  Rebecca [name] herself palpated a mass in the right axilla. She underwent bilateral diagnostic mammography with tomography and right breast ultrasonography at [facility] on 01/03/2021 showing: breast density category B; two adjacent suspicious right breast masses at 5 o'clock; markedly abnormal right axillary lymph node; no evidence of malignancy on left.  Accordingly on 01/06/2021 she proceeded to biopsy of the right breast area in question. The pathology from this procedure ([id number]) showed: invasive ductal  carcinoma, grade 3. Prognostic indicators significant for: estrogen receptor, 10% positive with weak staining intensity and progesterone receptor, 0% negative. Proliferation marker Ki67 at 90%. HER2 equivocal by immunohistochemistry (2+), but negative by fluorescent in situ hybridization with a signals ratio 1.38 and number per cell 3.63.  Cancer Staging Malignant neoplasm of lower-inner quadrant of right breast of female, estrogen receptor positive ([place]) Staging form: Breast, AJCC 8th Edition - Clinical: Stage IIIB (cT2, cN1, cM0, G3, ER-, PR-, HER2-) - Signed by Chauncey Cruel, MD on 01/18/2021 Stage prefix: Initial diagnosis Histologic grading system: 3 grade system  [name] is as detailed below.   PAST MEDICAL HISTORY: Past Medical History:  Diagnosis Date  . Arthritis   . Breast cancer ([place])   . Diabetes mellitus   . Family history of lymphoma 01/18/2021  . Family history of throat cancer 01/18/2021  . Hyperlipidemia   . Hypertension     PAST SURGICAL HISTORY: Past Surgical History:  Procedure Laterality Date  . CESAREAN SECTION    . PORTACATH PLACEMENT N/A 01/26/2021   Procedure: INSERTION PORT-A-CATH;  Surgeon: Erroll Luna, MD;  Location: [facility] OR;  Service: General;  Laterality: N/A;  46    FAMILY HISTORY: Family History  Problem Relation Age of Onset  . Diabetes Mother   . Heart disease Mother   . Diabetes Father   . Throat cancer Father        d. 1s  . Diabetes Sister   . Diabetes Brother   . Lymphoma Cousin        maternal cousin; d. 59s   Her father died in his 54's and her mother in her 97'W, both from complications of diabetes. [name] has 6 brothers and 1  sister. There is no family history of breast or ovarian cancer to her knowledge.   GYNECOLOGIC HISTORY:  No LMP recorded. Menarche: 55 years old [place] P 0 LMP 10/31/2020 (before that, 06/2020) Contraceptive used from 2017-2018 HRT n/a  Hysterectomy? no BSO? no   SOCIAL  HISTORY: (updated 12/2020)  Tonya is originally from [place]. 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 She is divorced. She lives at home with her nephew Rebecca [name], who is 24 and works for [employer]. She attends [employer]).    ADVANCED DIRECTIVES: not in place; at the 01/18/2021 visit she stated her ex-husband Rebecca [name], who is disabled due to past injuries, ([phone]) would be the person to contact if she became incapacitated [the patient states aside from her nephew she has no other relatives in [place]: Social History   Tobacco Use  . Smoking status: Passive Smoke Exposure - Never Smoker  . Smokeless tobacco: Never Used  Substance Use Topics  . Alcohol use: No  . Drug use: No     Colonoscopy: never done, referral in place  PAP: 12/2020, negative  Bone density: never done   Allergies  Allergen Reactions  . Lisinopril Swelling    Swelling in throat, coughing, difficulty swallowing, could not sleep. Occurred Dec 2020.    Current Outpatient Medications  Medication Sig Dispense Refill  . amLODipine (NORVASC) 10 MG tablet TAKE 1 TABLET BY MOUTH EVERY DAY 90 tablet 0  . Ascorbic Acid (VITAMIN C) 1000 MG tablet Take 1 tablet (1,000 mg total) by mouth daily. 30 tablet 0  . atorvastatin (LIPITOR) 80 MG tablet TAKE 1 TABLET (80 MG TOTAL) BY MOUTH DAILY. 90 tablet 1  . Blood Glucose Monitoring Suppl (ONETOUCH VERIO REFLECT) w/Device KIT 1 kit by Does not apply route in the morning, at noon, and at bedtime. Use as directed to test blood sugar up to three times daily.E11.9 1 kit 0  . cetirizine (ZYRTEC) 10 MG tablet Take 1 tablet (10 mg total) by mouth daily. 30 tablet 0  . cloNIDine (CATAPRES) 0.1 MG tablet Take 1 tablet (0.1 mg total) by mouth at bedtime as needed. 90 tablet 1  . clotrimazole (CLOTRIMAZOLE AF) 1 % cream Apply 1 application topically 2 (two) times daily.  30 g 1  . cyclobenzaprine (FLEXERIL) 10 MG tablet Take 10 mg by mouth 3 (three) times daily as needed for muscle spasms.    . dapagliflozin propanediol (FARXIGA) 10 MG TABS tablet Take 1 tablet (10 mg total) by mouth daily before breakfast. 90 tablet 1  . dexamethasone (DECADRON) 4 MG tablet TAKE 2 TABLETS ONCE A DAY FOR 3 DAYS AFTER CARBOPLATIN AND AC CHEMOTHERAPY. TAKE WITH FOOD. 30 tablet 1  . ergocalciferol (DRISDOL) 1.25 MG (50000 UT) capsule Take 1 capsule (50,000 Units total) by mouth once a week. 12 capsule 0  . glipiZIDE (GLUCOTROL) 5 MG tablet TAKE 1 TABLET BY MOUTH DAILY (Patient taking differently: Take 5 mg by mouth daily before breakfast.) 90 tablet 1  . glucose blood (ONETOUCH VERIO) test strip Use as directed to test blood sugar up to three times daily. E11.9 100 each 6  . Lancets (ONETOUCH DELICA PLUS [id number]) MISC 1 Device by Does not apply route in the morning, at noon, and at bedtime. Use as directed to test blood sugar up to three times daily. E11.9 100 each 6  . lidocaine-prilocaine (EMLA) cream APPLY TO AFFECTED AREA AS  DIRECTED 30 g 3  . LORazepam (ATIVAN) 0.5 MG tablet Take 1 tablet by mouth at bedtime. 30 tablet 0  . LORazepam (ATIVAN) 0.5 MG tablet Place 1 tablet (0.5 mg total) under the tongue 2 (two) times daily as needed (Nausea). 40 tablet 0  . metFORMIN (GLUCOPHAGE) 500 MG tablet TAKE 1 TABLET BY MOUTH TWICE DAILY 180 tablet 1  . naproxen (NAPROSYN) 500 MG tablet Take 1 tablet (500 mg total) by mouth 2 (two) times daily with a meal. 30 tablet 1  . Omega-3 1000 MG CAPS Take 1 capsule (1,000 mg total) by mouth daily. 30 capsule 0  . omeprazole (PRILOSEC) 20 MG capsule Take 1 capsule (20 mg total) by mouth daily. 30 capsule 0  . oxyCODONE (OXY IR/ROXICODONE) 5 MG immediate release tablet Take 1 tablet (5 mg total) by mouth every 6 (six) hours as needed for severe pain. 15 tablet 0  . polyethylene glycol (MIRALAX / GLYCOLAX) 17 g packet TAKE 17 G BY MOUTH DAILY. 14 each  0  . prochlorperazine (COMPAZINE) 10 MG tablet TAKE 1 TABLET BY MOUTH EVERY 6 HOURS AS NEEDED FOR NAUSEA OR VOMITING 30 tablet 1  . vitamin B-12 (CYANOCOBALAMIN) 100 MCG tablet Take 1 tablet (100 mcg total) by mouth daily. 30 tablet 0   No current facility-administered medications for this visit.    OBJECTIVE: [place] woman who appears stated age  There were no vitals filed for this visit.   There is no height or weight on file to calculate BMI.   Wt Readings from Last 3 Encounters:  03/14/21 134 lb 14.4 oz (61.2 kg)  03/07/21 130 lb 14.4 oz (59.4 kg)  02/28/21 132 lb 8 oz (60.1 kg)      ECOG FS:1 - Symptomatic but completely ambulatory  LAB RESULTS:  CMP     Component Value Date/Time   NA 139 03/14/2021 1003   NA 140 12/27/2020 1123   K 3.8 03/14/2021 1003   CL 103 03/14/2021 1003   CO2 25 03/14/2021 1003   GLUCOSE 76 03/14/2021 1003   BUN 10 03/14/2021 1003   BUN 11 12/27/2020 1123   CREATININE 0.56 03/14/2021 1003   CREATININE 0.50 11/02/2016 1102   CALCIUM 9.3 03/14/2021 1003   PROT 7.3 03/14/2021 1003   PROT 7.5 12/27/2020 1123   ALBUMIN 3.8 03/14/2021 1003   ALBUMIN 4.7 12/27/2020 1123   AST 23 03/14/2021 1003   ALT 19 03/14/2021 1003   ALKPHOS 66 03/14/2021 1003   BILITOT <0.2 (L) 03/14/2021 1003   GFRNONAA >60 03/14/2021 1003   GFRNONAA >89 01/16/2016 1006   GFRAA 118 06/02/2020 0942   GFRAA >89 01/16/2016 1006    No results found for: TOTALPROTELP, ALBUMINELP, A1GS, A2GS, BETS, BETA2SER, GAMS, MSPIKE, SPEI  Lab Results  Component Value Date   WBC 2.6 (L) 03/14/2021   NEUTROABS 0.6 (L) 03/14/2021   HGB 11.5 (L) 03/14/2021   HCT 35.8 (L) 03/14/2021   MCV 82.3 03/14/2021   PLT 324 03/14/2021    No results found for: LABCA2  No components found for: [id number]  No results for input(s): INR in the last 168 hours.  No results found for: LABCA2  No results found for: DXI338  No results found for: SNK539  No results found for: JQB341  No  results found for: CA2729  No components found for: HGQUANT  No results found for: CEA1 / No results found for: CEA1   No results found for: AFPTUMOR  No results found for: [facility]  No results found for: KPAFRELGTCHN, LAMBDASER, KAPLAMBRATIO (kappa/lambda light chains)  No results found for: HGBA, HGBA2QUANT, HGBFQUANT, HGBSQUAN (Hemoglobinopathy evaluation)   No results found for: LDH  No results found for: IRON, TIBC, IRONPCTSAT (Iron and TIBC)  No results found for: FERRITIN  Urinalysis    Component Value Date/Time   COLORURINE YELLOW 06/24/2007 0116   APPEARANCEUR CLOUDY (A) 06/24/2007 0116   LABSPEC 1.025 09/18/2020 1125   PHURINE 5.5 09/18/2020 1125   GLUCOSEU 100 (A) 09/18/2020 1125   HGBUR TRACE (A) 09/18/2020 1125   BILIRUBINUR NEGATIVE 09/18/2020 1125   KETONESUR NEGATIVE 09/18/2020 1125   PROTEINUR NEGATIVE 09/18/2020 1125   UROBILINOGEN 0.2 09/18/2020 1125   NITRITE NEGATIVE 09/18/2020 1125   LEUKOCYTESUR NEGATIVE 09/18/2020 1125    STUDIES: No results found.   ELIGIBLE FOR AVAILABLE RESEARCH PROTOCOL: no  ASSESSMENT: 55 y.o. [place] woman status post right breast lower inner quadrant biopsy 01/06/2021 for a clinically T2 N1, stage IIIB functionally triple negative invasive ductal carcinoma, grade 3, with and MIB-1 of 90%  (a) chest CT scan 01/29/2021 shows no evidence of metastatic disease  (b) bone scan 02/01/2021 shows no evidence of metastatic disease  (1) neoadjuvant chemotherapy will consist of pembrolizumab given day 1 together with carboplatin and paclitaxel, with Taxol given on day 8 and 15, and fill gastrium on days 16,17 and 18, repeated every 21 days x 4, followed by doxorubicin and cyclophosphamide every 21 days x 4 also given with pembrolizumab on day 1  (a) carboplatin/paclitaxel discontinued after the cycle 2-day 8 dose with neuropathy developing  (b) doxorubicin/cyclophosphamide to start 03/21/2021  (c) pembrolizumab continuing  to total 1 year  (2) definitive surgery to follow  (3) adjuvant radiation  (4) genetics testing  Negative hereditary cancer genetic testing: no pathogenic variants detected in [facility] CancerNext-Expanded + RNAinsight Panel.  The report date is January 31, 2021.  The CancerNext-Expanded gene panel offered by [facility] and includes sequencing, rearrangement, and RNA analysis for the following 77 genes: AIP, ALK, APC, ATM, AXIN2, BAP1, BARD1, BLM, BMPR1A, BRCA1, BRCA2, BRIP1, CDC73, CDH1, CDK4, CDKN1B, CDKN2A, CHEK2, CTNNA1, DICER1, FANCC, FH, FLCN, GALNT12, KIF1B, LZTR1, MAX, MEN1, MET, MLH1, MSH2, MSH3, MSH6, MUTYH, NBN, NF1, NF2, NTHL1, PALB2, PHOX2B, PMS2, POT1, PRKAR1A, PTCH1, PTEN, RAD51C, RAD51D, RB1, RECQL, RET, SDHA, SDHAF2, SDHB, SDHC, SDHD, SMAD4, SMARCA4, SMARCB1, SMARCE1, STK11, SUFU, TMEM127, TP53, TSC1, TSC2, VHL and XRCC2 (sequencing and deletion/duplication); EGFR, EGLN1, HOXB13, KIT, MITF, PDGFRA, POLD1, and POLE (sequencing only); EPCAM and GREM1 (deletion/duplication only).     PLAN: Ayaana arrived late and by the time she was reported to be here (there was some delay after she got here) her appointment had already been taken out by another patient.  We are unfortunately in a very tight situation right now regarding nursing.  I discussed all this with her in detail.  She has now been rescheduled for infusion tomorrow.  I will make sure to reschedule her PEG fill gastrium as well.   Virgie Dad. Jisela Merlino, MD 03/21/2021 6:10 PM Medical Oncology and Hematology [facility] [place], [place] [address] Tel. [phone]    Fax. [phone]   This document serves as a record of services personally performed by Lurline Del, MD. It was created on his behalf by Wilburn Mylar, a trained medical scribe. The creation of this record is based on the scribe's personal observations and the provider's statements to them.   Lindie Spruce MD, have reviewed the  above documentation  for accuracy and completeness, and I agree with the above.   *Total Encounter Time as defined by the Centers for Medicare and Medicaid Services includes, in addition to the face-to-face time of a patient visit (documented in the note above) non-face-to-face time: obtaining and reviewing outside history, ordering and reviewing medications, tests or procedures, care coordination (communications with other health care professionals or caregivers) and documentation in the medical record.

## 2021-03-21 ENCOUNTER — Ambulatory Visit: Payer: 59

## 2021-03-21 ENCOUNTER — Telehealth: Payer: Self-pay | Admitting: *Deleted

## 2021-03-21 ENCOUNTER — Other Ambulatory Visit: Payer: 59

## 2021-03-21 ENCOUNTER — Other Ambulatory Visit: Payer: Self-pay

## 2021-03-21 ENCOUNTER — Inpatient Hospital Stay (HOSPITAL_BASED_OUTPATIENT_CLINIC_OR_DEPARTMENT_OTHER): Payer: 59 | Admitting: Oncology

## 2021-03-21 DIAGNOSIS — C50311 Malignant neoplasm of lower-inner quadrant of right female breast: Secondary | ICD-10-CM | POA: Diagnosis not present

## 2021-03-21 DIAGNOSIS — Z5112 Encounter for antineoplastic immunotherapy: Secondary | ICD-10-CM | POA: Diagnosis not present

## 2021-03-21 DIAGNOSIS — Z170 Estrogen receptor positive status [ER+]: Secondary | ICD-10-CM | POA: Diagnosis not present

## 2021-03-22 ENCOUNTER — Inpatient Hospital Stay: Payer: 59 | Attending: Oncology

## 2021-03-22 ENCOUNTER — Inpatient Hospital Stay: Payer: 59

## 2021-03-22 ENCOUNTER — Other Ambulatory Visit: Payer: Self-pay | Admitting: Oncology

## 2021-03-22 ENCOUNTER — Telehealth: Payer: Self-pay | Admitting: Oncology

## 2021-03-22 VITALS — BP 121/75 | HR 94 | Temp 97.9°F | Resp 18 | Ht 63.0 in | Wt 135.8 lb

## 2021-03-22 DIAGNOSIS — Z170 Estrogen receptor positive status [ER+]: Secondary | ICD-10-CM | POA: Diagnosis not present

## 2021-03-22 DIAGNOSIS — Z5189 Encounter for other specified aftercare: Secondary | ICD-10-CM | POA: Diagnosis not present

## 2021-03-22 DIAGNOSIS — Z79899 Other long term (current) drug therapy: Secondary | ICD-10-CM | POA: Insufficient documentation

## 2021-03-22 DIAGNOSIS — Z95828 Presence of other vascular implants and grafts: Secondary | ICD-10-CM

## 2021-03-22 DIAGNOSIS — Z5111 Encounter for antineoplastic chemotherapy: Secondary | ICD-10-CM | POA: Diagnosis present

## 2021-03-22 DIAGNOSIS — C50311 Malignant neoplasm of lower-inner quadrant of right female breast: Secondary | ICD-10-CM

## 2021-03-22 DIAGNOSIS — Z5112 Encounter for antineoplastic immunotherapy: Secondary | ICD-10-CM | POA: Diagnosis not present

## 2021-03-22 LAB — CMP (CANCER CENTER ONLY)
ALT: 17 U/L (ref 0–44)
AST: 16 U/L (ref 15–41)
Albumin: 3.8 g/dL (ref 3.5–5.0)
Alkaline Phosphatase: 81 U/L (ref 38–126)
Anion gap: 13 (ref 5–15)
BUN: 18 mg/dL (ref 6–20)
CO2: 24 mmol/L (ref 22–32)
Calcium: 9.2 mg/dL (ref 8.9–10.3)
Chloride: 102 mmol/L (ref 98–111)
Creatinine: 0.63 mg/dL (ref 0.44–1.00)
GFR, Estimated: >60 mL/min (ref >60)
Glucose, Bld: 260 mg/dL — ABNORMAL HIGH (ref 70–99)
Potassium: 3.7 mmol/L (ref 3.5–5.1)
Sodium: 139 mmol/L (ref 135–145)
Total Bilirubin: <0.2 mg/dL — ABNORMAL LOW (ref 0.3–1.2)
Total Protein: 7.1 g/dL (ref 6.5–8.1)

## 2021-03-22 LAB — CBC WITH DIFFERENTIAL (CANCER CENTER ONLY)
Abs Immature Granulocytes: 0.49 10*3/uL — ABNORMAL HIGH (ref 0.00–0.07)
Basophils Absolute: 0 10*3/uL (ref 0.0–0.1)
Basophils Relative: 1 %
Eosinophils Absolute: 0.1 10*3/uL (ref 0.0–0.5)
Eosinophils Relative: 1 %
HCT: 36.1 % (ref 36.0–46.0)
Hemoglobin: 11.5 g/dL — ABNORMAL LOW (ref 12.0–15.0)
Immature Granulocytes: 8 %
Lymphocytes Relative: 33 %
Lymphs Abs: 2 10*3/uL (ref 0.7–4.0)
MCH: 26.4 pg (ref 26.0–34.0)
MCHC: 31.9 g/dL (ref 30.0–36.0)
MCV: 83 fL (ref 80.0–100.0)
Monocytes Absolute: 0.7 10*3/uL (ref 0.1–1.0)
Monocytes Relative: 11 %
Neutro Abs: 2.7 10*3/uL (ref 1.7–7.7)
Neutrophils Relative %: 46 %
Platelet Count: 117 10*3/uL — ABNORMAL LOW (ref 150–400)
RBC: 4.35 MIL/uL (ref 3.87–5.11)
RDW: 15.4 % (ref 11.5–15.5)
WBC Count: 5.9 10*3/uL (ref 4.0–10.5)
nRBC: 0.3 % — ABNORMAL HIGH (ref 0.0–0.2)

## 2021-03-22 MED ORDER — SODIUM CHLORIDE 0.9 % IV SOLN
600.0000 mg/m2 | Freq: Once | INTRAVENOUS | Status: AC
  Administered 2021-03-22: 1000 mg via INTRAVENOUS
  Filled 2021-03-22: qty 50

## 2021-03-22 MED ORDER — PALONOSETRON HCL INJECTION 0.25 MG/5ML
0.2500 mg | Freq: Once | INTRAVENOUS | Status: AC
  Administered 2021-03-22: 0.25 mg via INTRAVENOUS

## 2021-03-22 MED ORDER — SODIUM CHLORIDE 0.9 % IV SOLN
10.0000 mg | Freq: Once | INTRAVENOUS | Status: AC
  Administered 2021-03-22: 10 mg via INTRAVENOUS
  Filled 2021-03-22: qty 10

## 2021-03-22 MED ORDER — SODIUM CHLORIDE 0.9% FLUSH
10.0000 mL | Freq: Once | INTRAVENOUS | Status: AC
Start: 2021-03-22 — End: 2021-03-22
  Administered 2021-03-22: 10 mL
  Filled 2021-03-22: qty 10

## 2021-03-22 MED ORDER — DOXORUBICIN HCL CHEMO IV INJECTION 2 MG/ML
60.0000 mg/m2 | Freq: Once | INTRAVENOUS | Status: AC
Start: 2021-03-22 — End: 2021-03-22
  Administered 2021-03-22: 100 mg via INTRAVENOUS
  Filled 2021-03-22: qty 50

## 2021-03-22 MED ORDER — SODIUM CHLORIDE 0.9 % IV SOLN
150.0000 mg | Freq: Once | INTRAVENOUS | Status: AC
  Administered 2021-03-22: 150 mg via INTRAVENOUS
  Filled 2021-03-22: qty 150

## 2021-03-22 MED ORDER — SODIUM CHLORIDE 0.9% FLUSH
10.0000 mL | INTRAVENOUS | Status: DC | PRN
  Administered 2021-03-22: 10 mL
  Filled 2021-03-22: qty 10

## 2021-03-22 MED ORDER — SODIUM CHLORIDE 0.9 % IV SOLN
200.0000 mg | Freq: Once | INTRAVENOUS | Status: AC
  Administered 2021-03-22: 200 mg via INTRAVENOUS
  Filled 2021-03-22: qty 8

## 2021-03-22 MED ORDER — PALONOSETRON HCL INJECTION 0.25 MG/5ML
INTRAVENOUS | Status: AC
  Filled 2021-03-22: qty 5

## 2021-03-22 MED ORDER — ANTICOAGULANT SODIUM CITRATE 4% (200MG/5ML) IV SOLN
5.0000 mL | Freq: Once | Status: AC
Start: 2021-03-22 — End: 2021-03-22
  Administered 2021-03-22: 5 mL
  Filled 2021-03-22: qty 5

## 2021-03-22 MED ORDER — SODIUM CHLORIDE 0.9 % IV SOLN
Freq: Once | INTRAVENOUS | Status: AC
  Filled 2021-03-22: qty 250

## 2021-03-22 NOTE — Patient Instructions (Addendum)
[facility]   Discharge Instructions: Thank you for choosing [facility] to provide your oncology and hematology care.   If you have a lab appointment with the [facility], please go directly to the [facility] and check in at the registration area.   Wear comfortable clothing and clothing appropriate for easy access to any Portacath or PICC line.   We strive to give you quality time with your provider. You may need to reschedule your appointment if you arrive late (15 or more minutes).  Arriving late affects you and other patients whose appointments are after yours.  Also, if you miss three or more appointments without notifying the office, you may be dismissed from the clinic at the provider's discretion.      For prescription refill requests, have your pharmacy contact our office and allow 72 hours for refills to be completed.    Today you received the following chemotherapy and/or immunotherapy agents: Pembrolizumab (Keytruda), Doxorubicin (Adriamycin), and Cyclophosphamide (Cytoxan)   To help prevent nausea and vomiting after your treatment, we encourage you to take your nausea medication as directed.  BELOW ARE SYMPTOMS THAT SHOULD BE REPORTED IMMEDIATELY: . *FEVER GREATER THAN 100.4 F (38 C) OR HIGHER . *CHILLS OR SWEATING . *NAUSEA AND VOMITING THAT IS NOT CONTROLLED WITH YOUR NAUSEA MEDICATION . *UNUSUAL SHORTNESS OF BREATH . *UNUSUAL BRUISING OR BLEEDING . *URINARY PROBLEMS (pain or burning when urinating, or frequent urination) . *BOWEL PROBLEMS (unusual diarrhea, constipation, pain near the anus) . TENDERNESS IN MOUTH AND THROAT WITH OR WITHOUT PRESENCE OF ULCERS (sore throat, sores in mouth, or a toothache) . UNUSUAL RASH, SWELLING OR PAIN  . UNUSUAL VAGINAL DISCHARGE OR ITCHING   Items with * indicate a potential emergency and should be followed up as soon as possible or go to the Emergency Department if any problems should  occur.  Please show the CHEMOTHERAPY ALERT CARD or IMMUNOTHERAPY ALERT CARD at check-in to the Emergency Department and triage nurse.  Should you have questions after your visit or need to cancel or reschedule your appointment, please contact [facility]  Dept: [phone]  and follow the prompts.  Office hours are 8:00 a.m. to 4:30 p.m. Monday - Friday. Please note that voicemails left after 4:00 p.m. may not be returned until the following business day.  We are closed weekends and major holidays. You have access to a nurse at all times for urgent questions. Please call the main number to the clinic Dept: [phone] and follow the prompts.   For any non-urgent questions, you may also contact your provider using MyChart. We now offer e-Visits for anyone 24 and older to request care online for non-urgent symptoms. For details visit [email or web address].   Also download the MyChart app! Go to the app store, search "MyChart", open the app, select [facility], and log in with your MyChart username and password.  Due to Covid, a mask is required upon entering the hospital/clinic. If you do not have a mask, one will be given to you upon arrival. For doctor visits, patients may have 1 support person aged 40 or older with them. For treatment visits, patients cannot have anyone with them due to current Covid guidelines and our immunocompromised population.    Doxorubicin injection What is this medicine? DOXORUBICIN (dox oh ROO bi sin) is a chemotherapy drug. It is used to treat many kinds of cancer like leukemia, lymphoma, neuroblastoma, sarcoma, and Wilms' tumor. It  is also used to treat bladder cancer, breast cancer, lung cancer, ovarian cancer, stomach cancer, and thyroid cancer. This medicine may be used for other purposes; ask your health care provider or pharmacist if you have questions. COMMON BRAND NAME(S): Adriamycin, Adriamycin PFS, Adriamycin RDF, Rubex What  should I tell my health care provider before I take this medicine? They need to know if you have any of these conditions:  heart disease  history of low blood counts caused by a medicine  liver disease  recent or ongoing radiation therapy  an unusual or allergic reaction to doxorubicin, other chemotherapy agents, other medicines, foods, dyes, or preservatives  pregnant or trying to get pregnant  breast-feeding How should I use this medicine? This drug is given as an infusion into a vein. It is administered in a hospital or clinic by a specially trained health care professional. If you have pain, swelling, burning or any unusual feeling around the site of your injection, tell your health care professional right away. Talk to your pediatrician regarding the use of this medicine in children. Special care may be needed. Overdosage: If you think you have taken too much of this medicine contact a poison control center or emergency room at once. NOTE: This medicine is only for you. Do not share this medicine with others. What if I miss a dose? It is important not to miss your dose. Call your doctor or health care professional if you are unable to keep an appointment. What may interact with this medicine? This medicine may interact with the following medications:  6-mercaptopurine  paclitaxel  phenytoin  St. John's Wort  trastuzumab  verapamil This list may not describe all possible interactions. Give your health care provider a list of all the medicines, herbs, non-prescription drugs, or dietary supplements you use. Also tell them if you smoke, drink alcohol, or use illegal drugs. Some items may interact with your medicine. What should I watch for while using this medicine? This drug may make you feel generally unwell. This is not uncommon, as chemotherapy can affect healthy cells as well as cancer cells. Report any side effects. Continue your course of treatment even though you feel  ill unless your doctor tells you to stop. There is a maximum amount of this medicine you should receive throughout your life. The amount depends on the medical condition being treated and your overall health. Your doctor will watch how much of this medicine you receive in your lifetime. Tell your doctor if you have taken this medicine before. You may need blood work done while you are taking this medicine. Your urine may turn red for a few days after your dose. This is not blood. If your urine is dark or brown, call your doctor. In some cases, you may be given additional medicines to help with side effects. Follow all directions for their use. Call your doctor or health care professional for advice if you get a fever, chills or sore throat, or other symptoms of a cold or flu. Do not treat yourself. This drug decreases your body's ability to fight infections. Try to avoid being around people who are sick. This medicine may increase your risk to bruise or bleed. Call your doctor or health care professional if you notice any unusual bleeding. Talk to your doctor about your risk of cancer. You may be more at risk for certain types of cancers if you take this medicine. Do not become pregnant while taking this medicine or for 6 months  after stopping it. Women should inform their doctor if they wish to become pregnant or think they might be pregnant. Men should not father a child while taking this medicine and for 6 months after stopping it. There is a potential for serious side effects to an unborn child. Talk to your health care professional or pharmacist for more information. Do not breast-feed an infant while taking this medicine. This medicine has caused ovarian failure in some women and reduced sperm counts in some men This medicine may interfere with the ability to have a child. Talk with your doctor or health care professional if you are concerned about your fertility. This medicine may cause a decrease  in Co-Enzyme Q-10. You should make sure that you get enough Co-Enzyme Q-10 while you are taking this medicine. Discuss the foods you eat and the vitamins you take with your health care professional. What side effects may I notice from receiving this medicine? Side effects that you should report to your doctor or health care professional as soon as possible:  allergic reactions like skin rash, itching or hives, swelling of the face, lips, or tongue  breathing problems  chest pain  fast or irregular heartbeat  low blood counts - this medicine may decrease the number of white blood cells, red blood cells and platelets. You may be at increased risk for infections and bleeding.  pain, redness, or irritation at site where injected  signs of infection - fever or chills, cough, sore throat, pain or difficulty passing urine  signs of decreased platelets or bleeding - bruising, pinpoint red spots on the skin, black, tarry stools, blood in the urine  swelling of the ankles, feet, hands  tiredness  weakness Side effects that usually do not require medical attention (report to your doctor or health care professional if they continue or are bothersome):  diarrhea  hair loss  mouth sores  nail discoloration or damage  nausea  red colored urine  vomiting This list may not describe all possible side effects. Call your doctor for medical advice about side effects. You may report side effects to FDA at 1-800-FDA-1088. Where should I keep my medicine? This drug is given in a hospital or clinic and will not be stored at home. NOTE: This sheet is a summary. It may not cover all possible information. If you have questions about this medicine, talk to your doctor, pharmacist, or health care provider.  2021 Elsevier/Gold Standard (2017-05-22 11:01:26)  Cyclophosphamide Injection What is this medicine? CYCLOPHOSPHAMIDE (sye kloe FOSS fa mide) is a chemotherapy drug. It slows the growth of  cancer cells. This medicine is used to treat many types of cancer like lymphoma, myeloma, leukemia, breast cancer, and ovarian cancer, to name a few. This medicine may be used for other purposes; ask your health care provider or pharmacist if you have questions. COMMON BRAND NAME(S): Cytoxan, Neosar What should I tell my health care provider before I take this medicine? They need to know if you have any of these conditions:  heart disease  history of irregular heartbeat  infection  kidney disease  liver disease  low blood counts, like white cells, platelets, or red blood cells  on hemodialysis  recent or ongoing radiation therapy  scarring or thickening of the lungs  trouble passing urine  an unusual or allergic reaction to cyclophosphamide, other medicines, foods, dyes, or preservatives  pregnant or trying to get pregnant  breast-feeding How should I use this medicine? This drug is usually given  as an injection into a vein or muscle or by infusion into a vein. It is administered in a hospital or clinic by a specially trained health care professional. Talk to your pediatrician regarding the use of this medicine in children. Special care may be needed. Overdosage: If you think you have taken too much of this medicine contact a poison control center or emergency room at once. NOTE: This medicine is only for you. Do not share this medicine with others. What if I miss a dose? It is important not to miss your dose. Call your doctor or health care professional if you are unable to keep an appointment. What may interact with this medicine?  amphotericin B  azathioprine  certain antivirals for HIV or hepatitis  certain medicines for blood pressure, heart disease, irregular heart beat  certain medicines that treat or prevent blood clots like warfarin  certain other medicines for cancer  cyclosporine  etanercept  indomethacin  medicines that relax muscles for  surgery  medicines to increase blood counts  metronidazole This list may not describe all possible interactions. Give your health care provider a list of all the medicines, herbs, non-prescription drugs, or dietary supplements you use. Also tell them if you smoke, drink alcohol, or use illegal drugs. Some items may interact with your medicine. What should I watch for while using this medicine? Your condition will be monitored carefully while you are receiving this medicine. You may need blood work done while you are taking this medicine. Drink water or other fluids as directed. Urinate often, even at night. Some products may contain alcohol. Ask your health care professional if this medicine contains alcohol. Be sure to tell all health care professionals you are taking this medicine. Certain medicines, like metronidazole and disulfiram, can cause an unpleasant reaction when taken with alcohol. The reaction includes flushing, headache, nausea, vomiting, sweating, and increased thirst. The reaction can last from 30 minutes to several hours. Do not become pregnant while taking this medicine or for 1 year after stopping it. Women should inform their health care professional if they wish to become pregnant or think they might be pregnant. Men should not father a child while taking this medicine and for 4 months after stopping it. There is potential for serious side effects to an unborn child. Talk to your health care professional for more information. Do not breast-feed an infant while taking this medicine or for 1 week after stopping it. This medicine has caused ovarian failure in some women. This medicine may make it more difficult to get pregnant. Talk to your health care professional if you are concerned about your fertility. This medicine has caused decreased sperm counts in some men. This may make it more difficult to father a child. Talk to your health care professional if you are concerned about  your fertility. Call your health care professional for advice if you get a fever, chills, or sore throat, or other symptoms of a cold or flu. Do not treat yourself. This medicine decreases your body's ability to fight infections. Try to avoid being around people who are sick. Avoid taking medicines that contain aspirin, acetaminophen, ibuprofen, naproxen, or ketoprofen unless instructed by your health care professional. These medicines may hide a fever. Talk to your health care professional about your risk of cancer. You may be more at risk for certain types of cancer if you take this medicine. If you are going to need surgery or other procedure, tell your health care professional that  you are using this medicine. Be careful brushing or flossing your teeth or using a toothpick because you may get an infection or bleed more easily. If you have any dental work done, tell your dentist you are receiving this medicine. What side effects may I notice from receiving this medicine? Side effects that you should report to your doctor or health care professional as soon as possible:  allergic reactions like skin rash, itching or hives, swelling of the face, lips, or tongue  breathing problems  nausea, vomiting  signs and symptoms of bleeding such as bloody or black, tarry stools; red or dark brown urine; spitting up blood or brown material that looks like coffee grounds; red spots on the skin; unusual bruising or bleeding from the eyes, gums, or nose  signs and symptoms of heart failure like fast, irregular heartbeat, sudden weight gain; swelling of the ankles, feet, hands  signs and symptoms of infection like fever; chills; cough; sore throat; pain or trouble passing urine  signs and symptoms of kidney injury like trouble passing urine or change in the amount of urine  signs and symptoms of liver injury like dark yellow or brown urine; general ill feeling or flu-like symptoms; light-colored stools; loss  of appetite; nausea; right upper belly pain; unusually weak or tired; yellowing of the eyes or skin Side effects that usually do not require medical attention (report to your doctor or health care professional if they continue or are bothersome):  confusion  decreased hearing  diarrhea  facial flushing  hair loss  headache  loss of appetite  missed menstrual periods  signs and symptoms of low red blood cells or anemia such as unusually weak or tired; feeling faint or lightheaded; falls  skin discoloration This list may not describe all possible side effects. Call your doctor for medical advice about side effects. You may report side effects to FDA at 1-800-FDA-1088. Where should I keep my medicine? This drug is given in a hospital or clinic and will not be stored at home. NOTE: This sheet is a summary. It may not cover all possible information. If you have questions about this medicine, talk to your doctor, pharmacist, or health care provider.  2021 Elsevier/Gold Standard (2019-07-13 09:53:29)

## 2021-03-22 NOTE — Telephone Encounter (Signed)
Scheduled appt per 6/1 sch msg. Used interpreter line. Pt aware of appt.

## 2021-03-23 ENCOUNTER — Ambulatory Visit: Payer: 59

## 2021-03-24 ENCOUNTER — Other Ambulatory Visit: Payer: Self-pay

## 2021-03-24 ENCOUNTER — Other Ambulatory Visit: Payer: 59

## 2021-03-24 ENCOUNTER — Inpatient Hospital Stay: Payer: 59

## 2021-03-24 ENCOUNTER — Ambulatory Visit: Payer: 59

## 2021-03-24 VITALS — BP 115/71 | HR 94 | Temp 98.0°F | Resp 16

## 2021-03-24 DIAGNOSIS — Z170 Estrogen receptor positive status [ER+]: Secondary | ICD-10-CM

## 2021-03-24 DIAGNOSIS — Z5112 Encounter for antineoplastic immunotherapy: Secondary | ICD-10-CM | POA: Diagnosis not present

## 2021-03-24 MED ORDER — PEGFILGRASTIM-CBQV 6 MG/0.6ML ~~LOC~~ SOSY
PREFILLED_SYRINGE | SUBCUTANEOUS | Status: AC
  Filled 2021-03-24: qty 0.6

## 2021-03-24 MED ORDER — PEGFILGRASTIM-CBQV 6 MG/0.6ML ~~LOC~~ SOSY
6.0000 mg | PREFILLED_SYRINGE | Freq: Once | SUBCUTANEOUS | Status: AC
  Administered 2021-03-24: 6 mg via SUBCUTANEOUS

## 2021-03-24 NOTE — Patient Instructions (Signed)
Pegfilgrastim injection What is this medicine? PEGFILGRASTIM (PEG fil gra stim) is a long-acting granulocyte colony-stimulating factor that stimulates the growth of neutrophils, a type of white blood cell important in the body's fight against infection. It is used to reduce the incidence of fever and infection in patients with certain types of cancer who are receiving chemotherapy that affects the bone marrow, and to increase survival after being exposed to high doses of radiation. This medicine may be used for other purposes; ask your health care provider or pharmacist if you have questions. COMMON BRAND NAME(S): Fulphila, Neulasta, Nyvepria, UDENYCA, Ziextenzo What should I tell my health care provider before I take this medicine? They need to know if you have any of these conditions:  kidney disease  latex allergy  ongoing radiation therapy  sickle cell disease  skin reactions to acrylic adhesives (On-Body Injector only)  an unusual or allergic reaction to pegfilgrastim, filgrastim, other medicines, foods, dyes, or preservatives  pregnant or trying to get pregnant  breast-feeding How should I use this medicine? This medicine is for injection under the skin. If you get this medicine at home, you will be taught how to prepare and give the pre-filled syringe or how to use the On-body Injector. Refer to the patient Instructions for Use for detailed instructions. Use exactly as directed. Tell your healthcare provider immediately if you suspect that the On-body Injector may not have performed as intended or if you suspect the use of the On-body Injector resulted in a missed or partial dose. It is important that you put your used needles and syringes in a special sharps container. Do not put them in a trash can. If you do not have a sharps container, call your pharmacist or healthcare provider to get one. Talk to your pediatrician regarding the use of this medicine in children. While this drug  may be prescribed for selected conditions, precautions do apply. Overdosage: If you think you have taken too much of this medicine contact a poison control center or emergency room at once. NOTE: This medicine is only for you. Do not share this medicine with others. What if I miss a dose? It is important not to miss your dose. Call your doctor or health care professional if you miss your dose. If you miss a dose due to an On-body Injector failure or leakage, a new dose should be administered as soon as possible using a single prefilled syringe for manual use. What may interact with this medicine? Interactions have not been studied. This list may not describe all possible interactions. Give your health care provider a list of all the medicines, herbs, non-prescription drugs, or dietary supplements you use. Also tell them if you smoke, drink alcohol, or use illegal drugs. Some items may interact with your medicine. What should I watch for while using this medicine? Your condition will be monitored carefully while you are receiving this medicine. You may need blood work done while you are taking this medicine. Talk to your health care provider about your risk of cancer. You may be more at risk for certain types of cancer if you take this medicine. If you are going to need a MRI, CT scan, or other procedure, tell your doctor that you are using this medicine (On-Body Injector only). What side effects may I notice from receiving this medicine? Side effects that you should report to your doctor or health care professional as soon as possible:  allergic reactions (skin rash, itching or hives, swelling of   the face, lips, or tongue)  back pain  dizziness  fever  pain, redness, or irritation at site where injected  pinpoint red spots on the skin  red or dark-brown urine  shortness of breath or breathing problems  stomach or side pain, or pain at the shoulder  swelling  tiredness  trouble  passing urine or change in the amount of urine  unusual bruising or bleeding Side effects that usually do not require medical attention (report to your doctor or health care professional if they continue or are bothersome):  bone pain  muscle pain This list may not describe all possible side effects. Call your doctor for medical advice about side effects. You may report side effects to FDA at 1-800-FDA-1088. Where should I keep my medicine? Keep out of the reach of children. If you are using this medicine at home, you will be instructed on how to store it. Throw away any unused medicine after the expiration date on the label. NOTE: This sheet is a summary. It may not cover all possible information. If you have questions about this medicine, talk to your doctor, pharmacist, or health care provider.  2021 Elsevier/Gold Standard (2019-10-30 13:20:51)  

## 2021-03-26 ENCOUNTER — Other Ambulatory Visit: Payer: Self-pay | Admitting: Oncology

## 2021-03-26 DIAGNOSIS — Z170 Estrogen receptor positive status [ER+]: Secondary | ICD-10-CM

## 2021-03-28 ENCOUNTER — Encounter: Payer: Self-pay | Admitting: *Deleted

## 2021-03-28 ENCOUNTER — Encounter: Payer: Self-pay | Admitting: Oncology

## 2021-03-28 ENCOUNTER — Ambulatory Visit: Payer: 59

## 2021-03-28 ENCOUNTER — Other Ambulatory Visit: Payer: 59

## 2021-03-29 ENCOUNTER — Other Ambulatory Visit: Payer: Self-pay

## 2021-03-29 ENCOUNTER — Inpatient Hospital Stay: Payer: 59 | Admitting: Dietician

## 2021-03-29 NOTE — Progress Notes (Signed)
Nutrition Follow-up:  Patient with breast cancer. She is receiving neoadjuvant chemotherapy with pembrolizumab/carboplatin and paclitaxel.  Met with patient and interpretor in clinic. Patient reports her schedule was changed and did not receive chemotherapy today. Patient reports sour/bitter taste with meats and salads after chemotherapy for ~3 days. She continues to have nausea, but this has gotten better. Family and friends have been bringing over meals and she has not had to cook very much. She has been drinking fresh juices, milk with strawberries. Patient tried the Ensure samples and liked them, but concerned about the sugar because of her diabetes. Patient has questions about medications and is asking for nurse Varney Biles) contact information.   Medications: Metformin, Omega 3, Compazine, B12, Decadron  Labs: 6/1- Glucose 260  Anthropometrics: Weight 135 lb 12 oz on 6/1 increased from 134 lb 14.4 oz on 5/24 and 130 lb 14.4 oz on 5/17   NUTRITION DIAGNOSIS: Unintentional weight loss improving  INTERVENTION:  Discussed tips for nausea - foods best tolerate/foods to avoid, handout provided Discussed strategies for altered taste, handout provided Patient will drink 2 Ensure Plus/day (350 kcal, 16 grams protein) Complimentary Ensure Plus case provided today Discussed strategies for blood glucose management, eating balanced meals/snacks, not skipping meals, walking after eating as able - handout provided Would favor adjusting diabetes medications vs limiting patient dietary options Contacted interpretor services for update regarding requested assistance with translation of approved educational handouts  Contact information for nurse navigator provided  MONITORING, EVALUATION, GOAL: weight trends, intake   NEXT VISIT: Wednesday July 13 in infusion

## 2021-03-31 ENCOUNTER — Inpatient Hospital Stay (HOSPITAL_BASED_OUTPATIENT_CLINIC_OR_DEPARTMENT_OTHER): Payer: 59 | Admitting: Adult Health

## 2021-03-31 ENCOUNTER — Other Ambulatory Visit: Payer: Self-pay

## 2021-03-31 ENCOUNTER — Telehealth: Payer: Self-pay

## 2021-03-31 ENCOUNTER — Inpatient Hospital Stay: Payer: 59

## 2021-03-31 DIAGNOSIS — B373 Candidiasis of vulva and vagina: Secondary | ICD-10-CM

## 2021-03-31 DIAGNOSIS — Z95828 Presence of other vascular implants and grafts: Secondary | ICD-10-CM

## 2021-03-31 DIAGNOSIS — C50311 Malignant neoplasm of lower-inner quadrant of right female breast: Secondary | ICD-10-CM

## 2021-03-31 DIAGNOSIS — B3731 Acute candidiasis of vulva and vagina: Secondary | ICD-10-CM

## 2021-03-31 DIAGNOSIS — Z5112 Encounter for antineoplastic immunotherapy: Secondary | ICD-10-CM | POA: Diagnosis not present

## 2021-03-31 DIAGNOSIS — Z170 Estrogen receptor positive status [ER+]: Secondary | ICD-10-CM

## 2021-03-31 LAB — CBC WITH DIFFERENTIAL (CANCER CENTER ONLY)
Abs Immature Granulocytes: 0.04 10*3/uL (ref 0.00–0.07)
Basophils Absolute: 0 10*3/uL (ref 0.0–0.1)
Basophils Relative: 1 %
Eosinophils Absolute: 0 10*3/uL (ref 0.0–0.5)
Eosinophils Relative: 0 %
HCT: 27.1 % — ABNORMAL LOW (ref 36.0–46.0)
Hemoglobin: 8.9 g/dL — ABNORMAL LOW (ref 12.0–15.0)
Immature Granulocytes: 2 %
Lymphocytes Relative: 74 %
Lymphs Abs: 1.9 10*3/uL (ref 0.7–4.0)
MCH: 26.6 pg (ref 26.0–34.0)
MCHC: 32.8 g/dL (ref 30.0–36.0)
MCV: 80.9 fL (ref 80.0–100.0)
Monocytes Absolute: 0.1 10*3/uL (ref 0.1–1.0)
Monocytes Relative: 6 %
Neutro Abs: 0.4 10*3/uL — CL (ref 1.7–7.7)
Neutrophils Relative %: 17 %
Platelet Count: 14 10*3/uL — ABNORMAL LOW (ref 150–400)
RBC: 3.35 MIL/uL — ABNORMAL LOW (ref 3.87–5.11)
RDW: 14.8 % (ref 11.5–15.5)
WBC Count: 2.6 10*3/uL — ABNORMAL LOW (ref 4.0–10.5)
nRBC: 0 % (ref 0.0–0.2)

## 2021-03-31 LAB — CMP (CANCER CENTER ONLY)
ALT: 10 U/L (ref 0–44)
AST: 7 U/L — ABNORMAL LOW (ref 15–41)
Albumin: 3.7 g/dL (ref 3.5–5.0)
Alkaline Phosphatase: 68 U/L (ref 38–126)
Anion gap: 7 (ref 5–15)
BUN: 15 mg/dL (ref 6–20)
CO2: 32 mmol/L (ref 22–32)
Calcium: 9.1 mg/dL (ref 8.9–10.3)
Chloride: 99 mmol/L (ref 98–111)
Creatinine: 0.55 mg/dL (ref 0.44–1.00)
GFR, Estimated: >60 mL/min (ref >60)
Glucose, Bld: 87 mg/dL (ref 70–99)
Potassium: 3.3 mmol/L — ABNORMAL LOW (ref 3.5–5.1)
Sodium: 138 mmol/L (ref 135–145)
Total Bilirubin: 0.5 mg/dL (ref 0.3–1.2)
Total Protein: 6.6 g/dL (ref 6.5–8.1)

## 2021-03-31 MED ORDER — ANTICOAGULANT SODIUM CITRATE 4% (200MG/5ML) IV SOLN
5.0000 mL | Freq: Once | Status: AC
Start: 2021-03-31 — End: 2021-03-31
  Administered 2021-03-31: 5 mL
  Filled 2021-03-31: qty 5

## 2021-03-31 MED ORDER — CLOTRIMAZOLE 1 % EX CREA: 1.0000 "application " | TOPICAL_CREAM | Freq: Two times a day (BID) | CUTANEOUS | 1 refills | Status: DC

## 2021-03-31 MED ORDER — LORAZEPAM 0.5 MG PO TABS: ORAL_TABLET | Freq: Every evening | ORAL | 0 refills | Status: DC

## 2021-03-31 MED ORDER — LIDOCAINE-PRILOCAINE 2.5-2.5 % EX CREA: TOPICAL_CREAM | CUTANEOUS | 3 refills | Status: AC

## 2021-03-31 MED ORDER — CIPROFLOXACIN HCL 500 MG PO TABS: 500.0000 mg | ORAL_TABLET | Freq: Two times a day (BID) | ORAL | 0 refills | Status: DC

## 2021-03-31 MED ORDER — SODIUM CHLORIDE 0.9% FLUSH
10.0000 mL | Freq: Once | INTRAVENOUS | Status: AC
  Administered 2021-03-31: 10 mL
  Filled 2021-03-31: qty 10

## 2021-03-31 NOTE — Telephone Encounter (Signed)
CRITICAL VALUE:  Abs Neutrophil 0.4  DATE & TIME NOTIFIED: 6/10 1:56pm   This nurse spoke with Dr. Virgie Dad nurse and made aware of critical value. Acknowledged understanding.  No further questions at this time.

## 2021-03-31 NOTE — Progress Notes (Signed)
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 She is accompanied by interpreter.   Her most recent echocardiogram on 01/31/2021 showed an ejection fraction of 60-65%.  She received two cycles of Carboplatin, Pembrolizumab and Paclitaxel given on day 1 and Paclitaxel on days 8 and 15 of a 21 day cycle.  She developed neuropathy, and the Carboplatin and paclitaxel was stopped, and she began Doxorubicin, Cylophosphamide and Pembrolizumab on 03/22/2021.  She received Udenyca on day 3 (Neulasta biosimilar).   Rebecca [name] notes that she tolerated her treatment well.  She is confused about the change, and the need for the change in the chemotherapy. She denies any issues with treatment and she denies any further neuropathy.  She has no easy bruising/bleeding.      REVIEW OF SYSTEMS:  Review of Systems  Constitutional:  Positive for fatigue. Negative for appetite change, chills, fever and unexpected weight change.  HENT:   Negative for hearing loss, lump/mass, mouth sores and trouble swallowing.   Eyes:  Negative for eye problems and icterus.  Respiratory:  Negative for chest tightness, cough and shortness of breath.   Cardiovascular:  Negative for chest pain, leg swelling and palpitations.   Gastrointestinal:  Negative for abdominal distention, abdominal pain, constipation, diarrhea, nausea and vomiting.  Endocrine: Negative for hot flashes.  Genitourinary:  Negative for difficulty urinating.   Musculoskeletal:  Negative for arthralgias.  Skin:  Negative for itching and rash.  Neurological:  Negative for dizziness, extremity weakness, headaches and numbness.  Hematological:  Negative for adenopathy. Does not bruise/bleed easily.  Psychiatric/Behavioral:  Negative for depression. The patient is not nervous/anxious.      COVID 19 VACCINATION STATUS: fully vaccinated [employer]), with booster 12/2020   HISTORY OF CURRENT ILLNESS: From the original intake note:  Rebecca [name] herself palpated a mass in the right axilla. She underwent bilateral diagnostic mammography with tomography and right breast ultrasonography at [facility] on 01/03/2021 showing: breast density category B; two adjacent suspicious right breast masses at 5 o'clock; markedly abnormal right axillary lymph node; no evidence of malignancy on left.  Accordingly on 01/06/2021 she proceeded to biopsy of the right breast area in question. The pathology from this procedure ([id number]) showed: invasive ductal carcinoma, grade 3. Prognostic indicators significant for: estrogen receptor, 10% positive with weak staining intensity and progesterone receptor, 0% negative. Proliferation marker Ki67 at 90%. HER2 equivocal by immunohistochemistry (2+), but negative by fluorescent in situ hybridization with a signals ratio 1.38 and number per cell 3.63.  Cancer Staging Malignant neoplasm of lower-inner quadrant of right breast of female, estrogen receptor positive ([name]) Staging form: Breast, AJCC 8th Edition - Clinical: Stage IIIB (cT2, cN1, cM0, G3, ER-, PR-, HER2-) - Signed by Chauncey Cruel, MD on 01/18/2021 Stage prefix: Initial diagnosis Histologic grading system: 3 grade system  [name]  is as detailed below.  PAST MEDICAL HISTORY: Past Medical History:  Diagnosis Date   Arthritis    Breast cancer ([place])    Diabetes mellitus    Family history of lymphoma 01/18/2021   Family history of throat cancer 01/18/2021   Hyperlipidemia    Hypertension     PAST SURGICAL HISTORY: Past Surgical History:  Procedure Laterality Date   CESAREAN SECTION     PORTACATH PLACEMENT N/A 01/26/2021   Procedure: INSERTION PORT-A-CATH;  Surgeon: Erroll Luna, MD;  Location: [facility];  Service: General;  Laterality: N/A;  90    FAMILY HISTORY: Family History  Problem Relation Age of Onset   Diabetes Mother    Heart disease Mother    Diabetes Father    Throat cancer Father        d. 9s   Diabetes Sister    Diabetes Brother    Lymphoma Cousin        maternal cousin; d. 84s   Her father died in his 39's and her mother in her 00'Q, both from complications of diabetes. Rebecca [name] has 6 brothers and 1 sister. There is no family history of breast or ovarian cancer to her knowledge.   GYNECOLOGIC HISTORY:  No LMP recorded. Menarche: 55 years old [place] P 0 LMP 10/31/2020 (before that, 06/2020) Contraceptive used from 2017-2018 HRT n/a  Hysterectomy? no BSO? no   SOCIAL HISTORY: (updated 12/2020)  Rebecca [name] is originally from [place]. 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 She is divorced. She lives at home with her nephew Rebecca [name], who is 20 and works for [employer]. She attends [employer] ([employer]).    ADVANCED DIRECTIVES: not in place; at the 01/18/2021 visit she stated her ex-husband Rebecca [name], who is disabled due to past injuries, ([phone]) would be the person to contact if she became incapacitated [the patient states aside from her nephew she has no other relatives in [place]: Social History   Tobacco Use   Smoking status: Passive Smoke Exposure - Never Smoker   Smokeless  tobacco: Never  Substance Use Topics   Alcohol use: No   Drug use: No     Colonoscopy: never done, referral in place  PAP: 12/2020, negative  Bone density: never done   Allergies  Allergen Reactions   Lisinopril Swelling    Swelling in throat, coughing, difficulty swallowing, could not sleep. Occurred Dec 2020.    Current Outpatient Medications  Medication Sig Dispense Refill   amLODipine (NORVASC) 10 MG tablet TAKE 1 TABLET BY MOUTH EVERY DAY 90 tablet 0   Ascorbic Acid (VITAMIN C) 1000 MG tablet Take 1 tablet (1,000 mg total) by mouth daily. 30 tablet 0   atorvastatin (LIPITOR) 80 MG tablet TAKE 1 TABLET (80 MG TOTAL) BY MOUTH DAILY. 90 tablet 1   Blood Glucose Monitoring Suppl (ONETOUCH VERIO REFLECT) w/Device KIT 1 kit by Does not apply route in the morning, at noon, and at bedtime. Use as directed to test blood sugar up to three times daily.E11.9 1 kit 0   ciprofloxacin (CIPRO) 500 MG tablet Take 1 tablet (500 mg total) by mouth 2 (two) times daily. 10 tablet 0   cyclobenzaprine (FLEXERIL) 10 MG tablet Take 10 mg by mouth 3 (three) times daily as needed for muscle spasms.     dapagliflozin propanediol (FARXIGA) 10 MG TABS tablet Take 1 tablet (10 mg total) by mouth daily before breakfast. 90 tablet 1   dexamethasone (  DECADRON) 4 MG tablet TAKE 2 TABLETS ONCE A DAY FOR 3 DAYS AFTER CARBOPLATIN AND AC CHEMOTHERAPY. TAKE WITH FOOD. 30 tablet 1   ergocalciferol (DRISDOL) 1.25 MG (50000 UT) capsule Take 1 capsule (50,000 Units total) by mouth once a week. 12 capsule 0   glipiZIDE (GLUCOTROL) 5 MG tablet TAKE 1 TABLET BY MOUTH DAILY (Patient taking differently: Take 5 mg by mouth daily before breakfast.) 90 tablet 1   glucose blood (ONETOUCH VERIO) test strip Use as directed to test blood sugar up to three times daily. E11.9 100 each 6   Lancets (ONETOUCH DELICA PLUS [id number]) MISC 1 Device by Does not apply route in the morning, at noon, and at bedtime. Use as directed to test blood  sugar up to three times daily. E11.9 100 each 6   metFORMIN (GLUCOPHAGE) 500 MG tablet TAKE 1 TABLET BY MOUTH TWICE DAILY 180 tablet 1   naproxen (NAPROSYN) 500 MG tablet Take 1 tablet (500 mg total) by mouth 2 (two) times daily with a meal. 30 tablet 1   Omega-3 1000 MG CAPS Take 1 capsule (1,000 mg total) by mouth daily. 30 capsule 0   omeprazole (PRILOSEC) 20 MG capsule Take 1 capsule (20 mg total) by mouth daily. 30 capsule 0   oxyCODONE (OXY IR/ROXICODONE) 5 MG immediate release tablet Take 1 tablet (5 mg total) by mouth every 6 (six) hours as needed for severe pain. 15 tablet 0   polyethylene glycol (MIRALAX / GLYCOLAX) 17 g packet TAKE 17 G BY MOUTH DAILY. 14 each 0   prochlorperazine (COMPAZINE) 10 MG tablet TAKE 1 TABLET BY MOUTH EVERY 6 HOURS AS NEEDED FOR NAUSEA OR VOMITING 30 tablet 1   vitamin B-12 (CYANOCOBALAMIN) 100 MCG tablet Take 1 tablet (100 mcg total) by mouth daily. 30 tablet 0   cetirizine (ZYRTEC) 10 MG tablet Take 1 tablet (10 mg total) by mouth daily. (Patient not taking: Reported on 03/31/2021) 30 tablet 0   cloNIDine (CATAPRES) 0.1 MG tablet Take 1 tablet (0.1 mg total) by mouth at bedtime as needed. (Patient not taking: Reported on 03/31/2021) 90 tablet 1   clotrimazole (CLOTRIMAZOLE AF) 1 % cream Apply 1 application topically 2 (two) times daily. 30 g 1   lidocaine-prilocaine (EMLA) cream APPLY TO AFFECTED AREA AS DIRECTED 30 g 3   LORazepam (ATIVAN) 0.5 MG tablet Take 1 tablet by mouth at bedtime. 30 tablet 0   No current facility-administered medications for this visit.    OBJECTIVE:   Vitals:   03/31/21 1330  BP: 126/75  Pulse: 91  Resp: 18  Temp: 97.6 F (36.4 C)  SpO2: 100%     Body mass index is 24.5 kg/m.   Wt Readings from Last 3 Encounters:  03/31/21 138 lb 4.8 oz (62.7 kg)  03/22/21 135 lb 12 oz (61.6 kg)  03/14/21 134 lb 14.4 oz (61.2 kg)      ECOG FS:1 - Symptomatic but completely ambulatory GENERAL: Patient is a well appearing female in no  acute distress HEENT:  Sclerae anicteric.  Oropharynx clear and moist. No ulcerations or evidence of oropharyngeal candidiasis. Neck is supple.  NODES:  No cervical, supraclavicular, or axillary lymphadenopathy palpated.  BREAST EXAM:  difficulty palpating right breast mass LUNGS:  Clear to auscultation bilaterally.  No wheezes or rhonchi. HEART:  Regular rate and rhythm. No murmur appreciated. ABDOMEN:  Soft, nontender.  Positive, normoactive bowel sounds. No organomegaly palpated. MSK:  No focal spinal tenderness to palpation. Full range of motion bilaterally  in the upper extremities. EXTREMITIES:  No peripheral edema.   SKIN:  Clear with no obvious rashes or skin changes. No nail dyscrasia. NEURO:  Nonfocal. Well oriented.  Appropriate affect.   LAB RESULTS:  CMP     Component Value Date/Time   NA 138 03/31/2021 1308   NA 140 12/27/2020 1123   K 3.3 (L) 03/31/2021 1308   CL 99 03/31/2021 1308   CO2 32 03/31/2021 1308   GLUCOSE 87 03/31/2021 1308   BUN 15 03/31/2021 1308   BUN 11 12/27/2020 1123   CREATININE 0.55 03/31/2021 1308   CREATININE 0.50 11/02/2016 1102   CALCIUM 9.1 03/31/2021 1308   PROT 6.6 03/31/2021 1308   PROT 7.5 12/27/2020 1123   ALBUMIN 3.7 03/31/2021 1308   ALBUMIN 4.7 12/27/2020 1123   AST 7 (L) 03/31/2021 1308   ALT 10 03/31/2021 1308   ALKPHOS 68 03/31/2021 1308   BILITOT 0.5 03/31/2021 1308   GFRNONAA >60 03/31/2021 1308   GFRNONAA >89 01/16/2016 1006   GFRAA 118 06/02/2020 0942   GFRAA >89 01/16/2016 1006    No results found for: TOTALPROTELP, ALBUMINELP, A1GS, A2GS, BETS, BETA2SER, GAMS, MSPIKE, SPEI  Lab Results  Component Value Date   WBC 2.6 (L) 03/31/2021   NEUTROABS 0.4 (LL) 03/31/2021   HGB 8.9 (L) 03/31/2021   HCT 27.1 (L) 03/31/2021   MCV 80.9 03/31/2021   PLT 14 (L) 03/31/2021    No results found for: LABCA2  No components found for: WIOXBD532  No results for input(s): INR in the last 168 hours.  No results found for:  LABCA2  No results found for: DJM426  No results found for: STM196  No results found for: QIW979  No results found for: CA2729  No components found for: HGQUANT  No results found for: CEA1 / No results found for: CEA1   No results found for: AFPTUMOR  No results found for: CHROMOGRNA  No results found for: KPAFRELGTCHN, LAMBDASER, KAPLAMBRATIO (kappa/lambda light chains)  No results found for: HGBA, HGBA2QUANT, HGBFQUANT, HGBSQUAN (Hemoglobinopathy evaluation)   No results found for: LDH  No results found for: IRON, TIBC, IRONPCTSAT (Iron and TIBC)  No results found for: FERRITIN  Urinalysis    Component Value Date/Time   COLORURINE YELLOW 06/24/2007 0116   APPEARANCEUR CLOUDY (A) 06/24/2007 0116   LABSPEC 1.025 09/18/2020 1125   PHURINE 5.5 09/18/2020 1125   GLUCOSEU 100 (A) 09/18/2020 1125   HGBUR TRACE (A) 09/18/2020 1125   BILIRUBINUR NEGATIVE 09/18/2020 1125   KETONESUR NEGATIVE 09/18/2020 1125   PROTEINUR NEGATIVE 09/18/2020 1125   UROBILINOGEN 0.2 09/18/2020 1125   NITRITE NEGATIVE 09/18/2020 1125   LEUKOCYTESUR NEGATIVE 09/18/2020 1125    STUDIES: No results found.   ELIGIBLE FOR AVAILABLE RESEARCH PROTOCOL: no  ASSESSMENT: 55 y.o. [place] woman status post right breast lower inner quadrant biopsy 01/06/2021 for a clinically T2 N1, stage IIIB functionally triple negative invasive ductal carcinoma, grade 3, with and MIB-1 of 90%  (a) chest CT scan 01/29/2021 shows no evidence of metastatic disease  (b) bone scan 02/01/2021 shows no evidence of metastatic disease  (1) neoadjuvant chemotherapy will consist of pembrolizumab given day 1 together with carboplatin and paclitaxel, with Taxol given on day 8 and 15, and fill gastrium on days 16,17 and 18, repeated every 21 days x 4, followed by doxorubicin and cyclophosphamide every 21 days x 4 also given with pembrolizumab on day 1  (a) carboplatin/paclitaxel discontinued after the cycle 2-day 8 dose with  neuropathy  developing  (b) doxorubicin/cyclophosphamide to start 03/21/2021  (c) pembrolizumab continuing to total 1 year  (2) definitive surgery to follow  (3) adjuvant radiation  (4) genetics testing  Negative hereditary cancer genetic testing: no pathogenic variants detected in [facility] CancerNext-Expanded + RNAinsight Panel.  The report date is January 31, 2021.  The CancerNext-Expanded gene panel offered by [facility] and includes sequencing, rearrangement, and RNA analysis for the following 77 genes: AIP, ALK, APC, ATM, AXIN2, BAP1, BARD1, BLM, BMPR1A, BRCA1, BRCA2, BRIP1, CDC73, CDH1, CDK4, CDKN1B, CDKN2A, CHEK2, CTNNA1, DICER1, FANCC, FH, FLCN, GALNT12, KIF1B, LZTR1, MAX, MEN1, MET, MLH1, MSH2, MSH3, MSH6, MUTYH, NBN, NF1, NF2, NTHL1, PALB2, PHOX2B, PMS2, POT1, PRKAR1A, PTCH1, PTEN, RAD51C, RAD51D, RB1, RECQL, RET, SDHA, SDHAF2, SDHB, SDHC, SDHD, SMAD4, SMARCA4, SMARCB1, SMARCE1, STK11, SUFU, TMEM127, TP53, TSC1, TSC2, VHL and XRCC2 (sequencing and deletion/duplication); EGFR, EGLN1, HOXB13, KIT, MITF, PDGFRA, POLD1, and POLE (sequencing only); EPCAM and GREM1 (deletion/duplication only).     PLAN: Yarithza is doing well today.  She tolerated the chemotherapy she received on 6/1 moderately well.  She is subsequently neutropenic and thrombocytopenic.  I reivewed this with her in detial.  First we discussed neutropenia precautions.  I prescribed Cipro BID and she knows to call us or go to the ER should she have a fever over 100.4.    Second, I talked about her plt count of 14.  I reviewed that if she develops any easy bruising/bleeding whatsoever to go to the ER.  She will return on Monday for repeat labs.  I discussed with her that we are going to have to dose reduce or hold her subsequent chemotherapy considering the decline in her counts.    We discussed her activity level and subsequent treatments.  She will return on Monday for f/u.  She knows to call for any questions that may arise between  now and her next appointment.  We are happy to see her sooner if needed.  Total encounter time: 30 minutes in chart review, lab review, face to face visit time, order entry, and documentation.   Rebecca Bihari, NP 03/31/21 3:58 PM Medical Oncology and Hematology [facility] [place], [place] [address] Tel. [phone]    Fax. [phone]   *Total Encounter Time as defined by the Centers for Medicare and Medicaid Services includes, in addition to the face-to-face time of a patient visit (documented in the note above) non-face-to-face time: obtaining and reviewing outside history, ordering and reviewing medications, tests or procedures, care coordination (communications with other health care professionals or caregivers) and documentation in the medical record.

## 2021-04-03 ENCOUNTER — Inpatient Hospital Stay: Payer: 59

## 2021-04-03 ENCOUNTER — Other Ambulatory Visit: Payer: Self-pay

## 2021-04-03 DIAGNOSIS — Z5112 Encounter for antineoplastic immunotherapy: Secondary | ICD-10-CM | POA: Diagnosis not present

## 2021-04-03 DIAGNOSIS — Z170 Estrogen receptor positive status [ER+]: Secondary | ICD-10-CM

## 2021-04-03 DIAGNOSIS — Z95828 Presence of other vascular implants and grafts: Secondary | ICD-10-CM

## 2021-04-03 LAB — CMP (CANCER CENTER ONLY)
ALT: 11 U/L (ref 0–44)
AST: 9 U/L — ABNORMAL LOW (ref 15–41)
Albumin: 3.6 g/dL (ref 3.5–5.0)
Alkaline Phosphatase: 83 U/L (ref 38–126)
Anion gap: 13 (ref 5–15)
BUN: 12 mg/dL (ref 6–20)
CO2: 28 mmol/L (ref 22–32)
Calcium: 9.3 mg/dL (ref 8.9–10.3)
Chloride: 99 mmol/L (ref 98–111)
Creatinine: 0.64 mg/dL (ref 0.44–1.00)
GFR, Estimated: >60 mL/min (ref >60)
Glucose, Bld: 245 mg/dL — ABNORMAL HIGH (ref 70–99)
Potassium: 3.5 mmol/L (ref 3.5–5.1)
Sodium: 140 mmol/L (ref 135–145)
Total Bilirubin: <0.2 mg/dL — ABNORMAL LOW (ref 0.3–1.2)
Total Protein: 6.6 g/dL (ref 6.5–8.1)

## 2021-04-03 LAB — CBC WITH DIFFERENTIAL (CANCER CENTER ONLY)
Abs Immature Granulocytes: 0.1 10*3/uL — ABNORMAL HIGH (ref 0.00–0.07)
Basophils Absolute: 0.1 10*3/uL (ref 0.0–0.1)
Basophils Relative: 1 %
Eosinophils Absolute: 0.1 10*3/uL (ref 0.0–0.5)
Eosinophils Relative: 1 %
HCT: 30.7 % — ABNORMAL LOW (ref 36.0–46.0)
Hemoglobin: 10 g/dL — ABNORMAL LOW (ref 12.0–15.0)
Lymphocytes Relative: 49 %
Lymphs Abs: 2.9 10*3/uL (ref 0.7–4.0)
MCH: 26.9 pg (ref 26.0–34.0)
MCHC: 32.6 g/dL (ref 30.0–36.0)
MCV: 82.5 fL (ref 80.0–100.0)
Metamyelocytes Relative: 2 %
Monocytes Absolute: 0.4 10*3/uL (ref 0.1–1.0)
Monocytes Relative: 7 %
Neutro Abs: 2.4 10*3/uL (ref 1.7–7.7)
Neutrophils Relative %: 40 %
Platelet Count: 76 10*3/uL — ABNORMAL LOW (ref 150–400)
RBC: 3.72 MIL/uL — ABNORMAL LOW (ref 3.87–5.11)
RDW: 16.2 % — ABNORMAL HIGH (ref 11.5–15.5)
WBC Count: 5.9 10*3/uL (ref 4.0–10.5)
nRBC: 1.5 % — ABNORMAL HIGH (ref 0.0–0.2)

## 2021-04-03 MED ORDER — SODIUM CHLORIDE 0.9% FLUSH
10.0000 mL | Freq: Once | INTRAVENOUS | Status: AC
  Administered 2021-04-03: 10 mL
  Filled 2021-04-03: qty 10

## 2021-04-04 ENCOUNTER — Ambulatory Visit: Payer: 59

## 2021-04-04 ENCOUNTER — Other Ambulatory Visit: Payer: 59

## 2021-04-04 ENCOUNTER — Other Ambulatory Visit: Payer: Self-pay | Admitting: Adult Health

## 2021-04-05 ENCOUNTER — Ambulatory Visit: Payer: 59

## 2021-04-06 ENCOUNTER — Other Ambulatory Visit: Payer: Self-pay | Admitting: Oncology

## 2021-04-06 ENCOUNTER — Ambulatory Visit: Payer: 59

## 2021-04-07 ENCOUNTER — Ambulatory Visit: Payer: 59

## 2021-04-09 ENCOUNTER — Other Ambulatory Visit: Payer: Self-pay | Admitting: Oncology

## 2021-04-10 NOTE — Progress Notes (Signed)
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 She is accompanied by her sister Rebecca [name] and and interpreter.  Rebecca [name] was switched to Doxorubicin, Cylophosphamide and Pembrolizumab on 03/22/2021 due to developing neuropathy.  She received Udenyca on day 3 (Neulasta biosimilar).  She was seen on day 8 and her ANC was noted to be 0.4.  Cipro was prescribed but the patient never started it because she did not understand what it was for.  Her most recent echocardiogram on 01/31/2021 showed an ejection fraction of 60-65%.   REVIEW OF SYSTEMS: Rebecca [name] is taking her nausea medicines appropriately but still had problems with nausea.  She did not vomit.  She has significant taste alteration and she is using a combination of salt baking soda and lemon juice to try to help with that.  She feels very sleepy and she is less active than she used to be she is got a sore mouth but no mouth sores, no fever, no reflux problems no constipation or diarrhea and no rash.  A detailed review of systems today was otherwise stable  COVID 19 VACCINATION STATUS: fully vaccinated [employer]), with booster 12/2020   HISTORY OF CURRENT ILLNESS: From the original intake note:  Rebecca [name] herself palpated a mass in  the right axilla. She underwent bilateral diagnostic mammography with tomography and right breast ultrasonography at [facility] on 01/03/2021 showing: breast density category B; two adjacent suspicious right breast masses at 5 o'clock; markedly abnormal right axillary lymph node; no evidence of malignancy on left.  Accordingly on 01/06/2021 she proceeded to biopsy of the right breast area in question. The pathology from this procedure ([id number]) showed: invasive ductal carcinoma, grade 3. Prognostic indicators significant for: estrogen receptor, 10% positive with weak staining intensity and progesterone receptor, 0% negative. Proliferation marker Ki67 at 90%. HER2 equivocal by immunohistochemistry (2+), but negative by fluorescent in situ hybridization with a signals ratio 1.38 and number per cell 3.63.  Cancer Staging Malignant neoplasm of lower-inner quadrant of right breast of female, estrogen receptor positive ([place]) Staging form: Breast, AJCC 8th Edition - Clinical: Stage IIIB (cT2, cN1, cM0, G3, ER-, PR-, HER2-) - Signed by Rebecca Cruel, MD on 01/18/2021 Stage prefix: Initial diagnosis Histologic grading system: 3 grade system  [name] is as detailed below.   PAST MEDICAL HISTORY: Past Medical History:  Diagnosis Date   Arthritis    Breast cancer ([place])    Diabetes mellitus    Family history of lymphoma 01/18/2021   Family history of throat cancer 01/18/2021   Hyperlipidemia    Hypertension     PAST SURGICAL HISTORY: Past Surgical History:  Procedure Laterality Date   CESAREAN SECTION     PORTACATH PLACEMENT N/A 01/26/2021   Procedure: INSERTION PORT-A-CATH;  Surgeon: Rebecca Luna, MD;  Location: [facility];  Service: General;  Laterality: N/A;  62  FAMILY HISTORY: Family History  Problem Relation Age of Onset   Diabetes Mother    Heart disease Mother    Diabetes Father    Throat cancer Father        d. 54s   Diabetes Sister    Diabetes  Brother    Lymphoma Cousin        maternal cousin; d. 42s   Her father died in his 48's and her mother in her 21'H, both from complications of diabetes. Rebecca [name] has 6 brothers and 1 sister. There is no family history of breast or ovarian cancer to her knowledge.   GYNECOLOGIC HISTORY:  No LMP recorded. Menarche: 55 years old [place] P 0 LMP 10/31/2020 (before that, 06/2020) Contraceptive used from 2017-2018 HRT n/a  Hysterectomy? no BSO? no   SOCIAL HISTORY: (updated 12/2020)  Rebecca [name] is originally from [place]. 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 She is divorced. She lives at home with her nephew Rebecca [name], who is 51 and works for [employer]. She attends [employer] ([employer]).    ADVANCED DIRECTIVES: not in place; at the 01/18/2021 visit she stated her ex-husband Rebecca [name], who is disabled due to past injuries, ([phone]) would be the person to contact if she became incapacitated [the patient states aside from her nephew she has no other relatives in [place]: Social History   Tobacco Use   Smoking status: Passive Smoke Exposure - Never Smoker   Smokeless tobacco: Never  Substance Use Topics   Alcohol use: No   Drug use: No     Colonoscopy: never done, referral in place  PAP: 12/2020, negative  Bone density: never done   Allergies  Allergen Reactions   Lisinopril Swelling    Swelling in throat, coughing, difficulty swallowing, could not sleep. Occurred Dec 2020.    Current Outpatient Medications  Medication Sig Dispense Refill   amLODipine (NORVASC) 10 MG tablet TAKE 1 TABLET BY MOUTH EVERY DAY 90 tablet 0   Ascorbic Acid (VITAMIN C) 1000 MG tablet Take 1 tablet (1,000 mg total) by mouth daily. 30 tablet 0   atorvastatin (LIPITOR) 80 MG tablet TAKE 1 TABLET (80 MG TOTAL) BY MOUTH DAILY. 90 tablet 1   Blood Glucose Monitoring Suppl (ONETOUCH VERIO REFLECT) w/Device  KIT 1 kit by Does not apply route in the morning, at noon, and at bedtime. Use as directed to test blood sugar up to three times daily.E11.9 1 kit 0   cetirizine (ZYRTEC) 10 MG tablet Take 1 tablet (10 mg total) by mouth daily. (Patient not taking: Reported on 03/31/2021) 30 tablet 0   ciprofloxacin (CIPRO) 500 MG tablet Take 1 tablet (500 mg total) by mouth 2 (two) times daily. 10 tablet 0   cloNIDine (CATAPRES) 0.1 MG tablet Take 1 tablet (0.1 mg total) by mouth at bedtime as needed. (Patient not taking: Reported on 03/31/2021) 90 tablet 1   clotrimazole (CLOTRIMAZOLE AF) 1 % cream Apply 1 application topically 2 (two) times daily. 30 g 1   cyclobenzaprine (FLEXERIL) 10 MG tablet Take 10 mg by mouth 3 (three) times daily as needed for muscle spasms.     dapagliflozin propanediol (FARXIGA) 10 MG TABS tablet Take 1 tablet (10 mg total) by mouth daily before breakfast. 90 tablet 1   dexamethasone (DECADRON) 4 MG tablet TAKE 2 TABLETS ONCE A DAY FOR 3 DAYS AFTER CARBOPLATIN AND AC CHEMOTHERAPY. TAKE WITH FOOD. 30 tablet 1  ergocalciferol (DRISDOL) 1.25 MG (50000 UT) capsule Take 1 capsule (50,000 Units total) by mouth once a week. 12 capsule 0   glipiZIDE (GLUCOTROL) 5 MG tablet TAKE 1 TABLET BY MOUTH DAILY (Patient taking differently: Take 5 mg by mouth daily before breakfast.) 90 tablet 1   glucose blood (ONETOUCH VERIO) test strip Use as directed to test blood sugar up to three times daily. E11.9 100 each 6   Lancets (ONETOUCH DELICA PLUS [id number]) MISC 1 Device by Does not apply route in the morning, at noon, and at bedtime. Use as directed to test blood sugar up to three times daily. E11.9 100 each 6   lidocaine-prilocaine (EMLA) cream APPLY TO AFFECTED AREA AS DIRECTED 30 g 3   LORazepam (ATIVAN) 0.5 MG tablet Take 1 tablet by mouth at bedtime. 30 tablet 0   metFORMIN (GLUCOPHAGE) 500 MG tablet TAKE 1 TABLET BY MOUTH TWICE DAILY 180 tablet 1   naproxen (NAPROSYN) 500 MG tablet Take 1 tablet (500  mg total) by mouth 2 (two) times daily with a meal. 30 tablet 1   Omega-3 1000 MG CAPS Take 1 capsule (1,000 mg total) by mouth daily. 30 capsule 0   omeprazole (PRILOSEC) 20 MG capsule Take 1 capsule (20 mg total) by mouth daily. 30 capsule 0   oxyCODONE (OXY IR/ROXICODONE) 5 MG immediate release tablet Take 1 tablet (5 mg total) by mouth every 6 (six) hours as needed for severe pain. 15 tablet 0   polyethylene glycol (MIRALAX / GLYCOLAX) 17 g packet TAKE 17 G BY MOUTH DAILY. 14 each 0   prochlorperazine (COMPAZINE) 10 MG tablet TAKE 1 TABLET BY MOUTH EVERY 6 HOURS AS NEEDED FOR NAUSEA OR VOMITING 30 tablet 1   vitamin B-12 (CYANOCOBALAMIN) 100 MCG tablet Take 1 tablet (100 mcg total) by mouth daily. 30 tablet 0   No current facility-administered medications for this visit.    OBJECTIVE: Rebecca [name] woman in no acute distress  Vitals:   04/11/21 0817  BP: 122/83  Pulse: 97  Resp: 18  Temp: 98.1 F (36.7 C)  SpO2: 99%     Body mass index is 24.99 kg/m.   Wt Readings from Last 3 Encounters:  04/11/21 141 lb 1.6 oz (64 kg)  03/31/21 138 lb 4.8 oz (62.7 kg)  03/22/21 135 lb 12 oz (61.6 kg)      ECOG FS:1 - Symptomatic but completely ambulatory  Sclerae unicteric, EOMs intact Wearing a mask No cervical or supraclavicular adenopathy Lungs no rales or rhonchi Heart regular rate and rhythm Abd soft, nontender, positive bowel sounds MSK no focal spinal tenderness, no upper extremity lymphedema Neuro: nonfocal, well oriented, appropriate affect Breasts: I do not palpate a well-defined mass in the right breast.  Both axillae are benign   LAB RESULTS:  CMP     Component Value Date/Time   NA 140 04/03/2021 0901   NA 140 12/27/2020 1123   K 3.5 04/03/2021 0901   CL 99 04/03/2021 0901   CO2 28 04/03/2021 0901   GLUCOSE 245 (H) 04/03/2021 0901   BUN 12 04/03/2021 0901   BUN 11 12/27/2020 1123   CREATININE 0.64 04/03/2021 0901   CREATININE 0.50 11/02/2016 1102   CALCIUM 9.3  04/03/2021 0901   PROT 6.6 04/03/2021 0901   PROT 7.5 12/27/2020 1123   ALBUMIN 3.6 04/03/2021 0901   ALBUMIN 4.7 12/27/2020 1123   AST 9 (L) 04/03/2021 0901   ALT 11 04/03/2021 0901   ALKPHOS 83 04/03/2021 0901   BILITOT <  0.2 (L) 04/03/2021 0901   GFRNONAA >60 04/03/2021 0901   GFRNONAA >89 01/16/2016 1006   GFRAA 118 06/02/2020 0942   GFRAA >89 01/16/2016 1006    No results found for: TOTALPROTELP, ALBUMINELP, A1GS, A2GS, BETS, BETA2SER, GAMS, MSPIKE, SPEI  Lab Results  Component Value Date   WBC 5.3 04/11/2021   NEUTROABS 2.9 04/11/2021   HGB 10.5 (L) 04/11/2021   HCT 33.0 (L) 04/11/2021   MCV 84.4 04/11/2021   PLT 435 (H) 04/11/2021    No results found for: LABCA2  No components found for: [id number]  No results for input(s): INR in the last 168 hours.  No results found for: LABCA2  No results found for: WHQ759  No results found for: FMB846  No results found for: KZL935  No results found for: CA2729  No components found for: HGQUANT  No results found for: CEA1 / No results found for: CEA1   No results found for: AFPTUMOR  No results found for: CHROMOGRNA  No results found for: KPAFRELGTCHN, LAMBDASER, KAPLAMBRATIO (kappa/lambda light chains)  No results found for: HGBA, HGBA2QUANT, HGBFQUANT, HGBSQUAN (Hemoglobinopathy evaluation)   No results found for: LDH  No results found for: IRON, TIBC, IRONPCTSAT (Iron and TIBC)  No results found for: FERRITIN  Urinalysis    Component Value Date/Time   COLORURINE YELLOW 06/24/2007 0116   APPEARANCEUR CLOUDY (A) 06/24/2007 0116   LABSPEC 1.025 09/18/2020 1125   PHURINE 5.5 09/18/2020 1125   GLUCOSEU 100 (A) 09/18/2020 1125   HGBUR TRACE (A) 09/18/2020 1125   BILIRUBINUR NEGATIVE 09/18/2020 1125   KETONESUR NEGATIVE 09/18/2020 1125   PROTEINUR NEGATIVE 09/18/2020 1125   UROBILINOGEN 0.2 09/18/2020 1125   NITRITE NEGATIVE 09/18/2020 1125   LEUKOCYTESUR NEGATIVE 09/18/2020 1125    STUDIES: No  results found.   ELIGIBLE FOR AVAILABLE RESEARCH PROTOCOL: no  ASSESSMENT: 55 y.o. 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, grade 3, with and MIB-1 of 90%  (a) chest CT scan 01/29/2021 shows no evidence of metastatic disease  (b) bone scan 02/01/2021 shows no evidence of metastatic disease  (1) neoadjuvant chemotherapy will consist of pembrolizumab given day 1 together with carboplatin and paclitaxel, with Taxol given on day 8 and 15, and filgrastim on days 16,17 and 18, repeated every 21 days x 4, followed by doxorubicin and cyclophosphamide every 21 days x 4 also given with pembrolizumab on day 1 and PEG filgrastim day 3  (a) carboplatin/paclitaxel discontinued after the cycle 2-day 8 dose with neuropathy developing  (b) doxorubicin/cyclophosphamide started 03/21/2021  (c) pembrolizumab continuing to total 1 year  (2) definitive surgery to follow  (3) adjuvant radiation  (4) genetics testing 01/31/2021 through the [facility] cancer next-expanded plus RNA insight panel found no deleterious mutations in the 77 genes tested.  PLAN: Rebecca [name] had an [place] of 0.4 at nadir after her first AC cycle.  This is acceptable so long as she takes antibiotics on day 7 which she did and will do with subsequent cycles.  Her platelet count was quite low but that is unlikely to be due to the [facility].  More likely it is due to the carboplatin that she received.  Accordingly I am not lowering her chemotherapy doses but will continue with curative intent.  I explained to her that the chemotherapy makes her immune system low particularly a week after treatment and she needs to take the Cipro which he already  has on hand twice daily beginning on 04/18/2021.  All this was written out for her in Arabic as well as in [place].  She understands that if she does not do this she is at increased risk of  infection and needing to be in the hospital.  She requests a letter to extend her sister's visa and I am glad to provide that for her.  She will come back and pick it up on day 3 when she returns for her PEG fill gastrium treatment.  We are going to call her on 06 28 to remind her to start the Cipro  Otherwise she will return in 3 weeks for her third cycle of AC.  After the fourth cycle she will be restaged and at that point we will decide whether to stop the neoadjuvant treatment and proceed to surgery or return to the carboplatin and paclitaxel treatments and see if we can add at least 1 more of those prior to surgery  Total encounter time 35 minutes.*        s doing well today.  She tolerated the chemotherapy she received on 6/1 moderately well.  She is subsequently neutropenic and thrombocytopenic.  I reivewed this with her in detial.  First we discussed neutropenia precautions.  I prescribed Cipro BID and she knows to call us or go to the ER should she have a fever over 100.4.    Second, I talked about her plt count of 14.  I reviewed that if she develops any easy bruising/bleeding whatsoever to go to the ER.  She will return on Monday for repeat labs.  I discussed with her that we are going to have to dose reduce or hold her subsequent chemotherapy considering the decline in her counts.    We discussed her activity level and subsequent treatments.  She will return on Monday for f/u.  She knows to call for any questions that may arise between now and her next appointment.  We are happy to see her sooner if needed.  Total encounter time: 30 minutes in chart review, lab review, face to face visit time, order entry, and documentation.    Rebecca [name]. Emberly Tomasso, MD 04/11/21 9:00 AM Medical Oncology and Hematology [facility] [place], [place] [address] Tel. [phone]    Fax. [phone]   I, Wilburn Mylar, am acting as scribe for Dr. Virgie [name].  Arvie Bartholomew.  I, Lurline Del MD, have reviewed the above documentation for accuracy and completeness, and I agree with the above.    *Total Encounter Time as defined by the Centers for Medicare and Medicaid Services includes, in addition to the face-to-face time of a patient visit (documented in the note above) non-face-to-face time: obtaining and reviewing outside history, ordering and reviewing medications, tests or procedures, care coordination (communications with other health care professionals or caregivers) and documentation in the medical record.

## 2021-04-11 ENCOUNTER — Inpatient Hospital Stay: Payer: 59

## 2021-04-11 ENCOUNTER — Other Ambulatory Visit: Payer: 59

## 2021-04-11 ENCOUNTER — Inpatient Hospital Stay (HOSPITAL_BASED_OUTPATIENT_CLINIC_OR_DEPARTMENT_OTHER): Payer: 59 | Admitting: Oncology

## 2021-04-11 ENCOUNTER — Other Ambulatory Visit: Payer: Self-pay

## 2021-04-11 VITALS — BP 122/83 | HR 97 | Temp 98.1°F | Resp 18 | Wt 141.1 lb

## 2021-04-11 DIAGNOSIS — C50311 Malignant neoplasm of lower-inner quadrant of right female breast: Secondary | ICD-10-CM | POA: Diagnosis not present

## 2021-04-11 DIAGNOSIS — Z170 Estrogen receptor positive status [ER+]: Secondary | ICD-10-CM

## 2021-04-11 DIAGNOSIS — Z5112 Encounter for antineoplastic immunotherapy: Secondary | ICD-10-CM | POA: Diagnosis not present

## 2021-04-11 DIAGNOSIS — Z95828 Presence of other vascular implants and grafts: Secondary | ICD-10-CM

## 2021-04-11 LAB — CMP (CANCER CENTER ONLY)
ALT: 18 U/L (ref 0–44)
AST: 21 U/L (ref 15–41)
Albumin: 4.1 g/dL (ref 3.5–5.0)
Alkaline Phosphatase: 76 U/L (ref 38–126)
Anion gap: 8 (ref 5–15)
BUN: 14 mg/dL (ref 6–20)
CO2: 27 mmol/L (ref 22–32)
Calcium: 9.2 mg/dL (ref 8.9–10.3)
Chloride: 101 mmol/L (ref 98–111)
Creatinine: 0.54 mg/dL (ref 0.44–1.00)
GFR, Estimated: >60 mL/min (ref >60)
Glucose, Bld: 202 mg/dL — ABNORMAL HIGH (ref 70–99)
Potassium: 3.9 mmol/L (ref 3.5–5.1)
Sodium: 136 mmol/L (ref 135–145)
Total Bilirubin: 0.4 mg/dL (ref 0.3–1.2)
Total Protein: 7.1 g/dL (ref 6.5–8.1)

## 2021-04-11 LAB — CBC WITH DIFFERENTIAL (CANCER CENTER ONLY)
Abs Immature Granulocytes: 0.16 10*3/uL — ABNORMAL HIGH (ref 0.00–0.07)
Basophils Absolute: 0 10*3/uL (ref 0.0–0.1)
Basophils Relative: 1 %
Eosinophils Absolute: 0 10*3/uL (ref 0.0–0.5)
Eosinophils Relative: 0 %
HCT: 33 % — ABNORMAL LOW (ref 36.0–46.0)
Hemoglobin: 10.5 g/dL — ABNORMAL LOW (ref 12.0–15.0)
Immature Granulocytes: 3 %
Lymphocytes Relative: 27 %
Lymphs Abs: 1.4 10*3/uL (ref 0.7–4.0)
MCH: 26.9 pg (ref 26.0–34.0)
MCHC: 31.8 g/dL (ref 30.0–36.0)
MCV: 84.4 fL (ref 80.0–100.0)
Monocytes Absolute: 0.7 10*3/uL (ref 0.1–1.0)
Monocytes Relative: 13 %
Neutro Abs: 2.9 10*3/uL (ref 1.7–7.7)
Neutrophils Relative %: 56 %
Platelet Count: 435 10*3/uL — ABNORMAL HIGH (ref 150–400)
RBC: 3.91 MIL/uL (ref 3.87–5.11)
RDW: 19 % — ABNORMAL HIGH (ref 11.5–15.5)
WBC Count: 5.3 10*3/uL (ref 4.0–10.5)
nRBC: 0.4 % — ABNORMAL HIGH (ref 0.0–0.2)

## 2021-04-11 MED ORDER — DEXAMETHASONE SODIUM PHOSPHATE 100 MG/10ML IJ SOLN
10.0000 mg | Freq: Once | INTRAMUSCULAR | Status: AC
  Administered 2021-04-11: 10 mg via INTRAVENOUS
  Filled 2021-04-11: qty 10

## 2021-04-11 MED ORDER — PALONOSETRON HCL INJECTION 0.25 MG/5ML
INTRAVENOUS | Status: AC
  Filled 2021-04-11: qty 5

## 2021-04-11 MED ORDER — FOSAPREPITANT DIMEGLUMINE INJECTION 150 MG
150.0000 mg | Freq: Once | INTRAVENOUS | Status: AC
  Administered 2021-04-11: 150 mg via INTRAVENOUS
  Filled 2021-04-11: qty 150

## 2021-04-11 MED ORDER — ANTICOAGULANT SODIUM CITRATE 4% (200MG/5ML) IV SOLN
5.0000 mL | Freq: Once | Status: AC
Start: 2021-04-11 — End: 2021-04-11
  Administered 2021-04-11: 5 mL
  Filled 2021-04-11: qty 5

## 2021-04-11 MED ORDER — SODIUM CHLORIDE 0.9% FLUSH
10.0000 mL | Freq: Once | INTRAVENOUS | Status: AC
  Administered 2021-04-11: 10 mL
  Filled 2021-04-11: qty 10

## 2021-04-11 MED ORDER — SODIUM CHLORIDE 0.9 % IV SOLN
Freq: Once | INTRAVENOUS | Status: AC
Start: 2021-04-11 — End: 2021-04-11
  Filled 2021-04-11: qty 250

## 2021-04-11 MED ORDER — SODIUM CHLORIDE 0.9% FLUSH
10.0000 mL | INTRAVENOUS | Status: DC | PRN
Start: 2021-04-11 — End: 2021-04-11
  Administered 2021-04-11: 10 mL
  Filled 2021-04-11: qty 10

## 2021-04-11 MED ORDER — SODIUM CHLORIDE 0.9 % IV SOLN
600.0000 mg/m2 | Freq: Once | INTRAVENOUS | Status: AC
  Administered 2021-04-11: 1000 mg via INTRAVENOUS
  Filled 2021-04-11: qty 50

## 2021-04-11 MED ORDER — SODIUM CHLORIDE 0.9 % IV SOLN
200.0000 mg | Freq: Once | INTRAVENOUS | Status: AC
  Administered 2021-04-11: 200 mg via INTRAVENOUS
  Filled 2021-04-11: qty 8

## 2021-04-11 MED ORDER — DOXORUBICIN HCL CHEMO IV INJECTION 2 MG/ML
60.0000 mg/m2 | Freq: Once | INTRAVENOUS | Status: AC
  Administered 2021-04-11: 100 mg via INTRAVENOUS
  Filled 2021-04-11: qty 50

## 2021-04-11 MED ORDER — PALONOSETRON HCL INJECTION 0.25 MG/5ML
0.2500 mg | Freq: Once | INTRAVENOUS | Status: AC
Start: 2021-04-11 — End: 2021-04-11
  Administered 2021-04-11: 0.25 mg via INTRAVENOUS

## 2021-04-11 NOTE — Patient Instructions (Addendum)
[facility] ONCOLOGY  Discharge Instructions: Thank you for choosing [facility] to provide your oncology and hematology care.   If you have a lab appointment with the [facility], please go directly to the [facility] and check in at the registration area.   Wear comfortable clothing and clothing appropriate for easy access to any [facility] or PICC line.   We strive to give you quality time with your provider. You may need to reschedule your appointment if you arrive late (15 or more minutes).  Arriving late affects you and other patients whose appointments are after yours.  Also, if you miss three or more appointments without notifying the office, you may be dismissed from the clinic at the provider's discretion.      For prescription refill requests, have your pharmacy contact our office and allow 72 hours for refills to be completed.    Today you received the following chemotherapy and/or immunotherapy agents: Keytruda, Adriamycin, Cytoxan      To help prevent nausea and vomiting after your treatment, we encourage you to take your nausea medication as directed.  BELOW ARE SYMPTOMS THAT SHOULD BE REPORTED IMMEDIATELY: *FEVER GREATER THAN 100.4 F (38 C) OR HIGHER *CHILLS OR SWEATING *NAUSEA AND VOMITING THAT IS NOT CONTROLLED WITH YOUR NAUSEA MEDICATION *UNUSUAL SHORTNESS OF BREATH *UNUSUAL BRUISING OR BLEEDING *URINARY PROBLEMS (pain or burning when urinating, or frequent urination) *BOWEL PROBLEMS (unusual diarrhea, constipation, pain near the anus) TENDERNESS IN MOUTH AND THROAT WITH OR WITHOUT PRESENCE OF ULCERS (sore throat, sores in mouth, or a toothache) UNUSUAL RASH, SWELLING OR PAIN  UNUSUAL VAGINAL DISCHARGE OR ITCHING   Items with * indicate a potential emergency and should be followed up as soon as possible or go to the Emergency Department if any problems should occur.  Please show the CHEMOTHERAPY ALERT CARD or IMMUNOTHERAPY ALERT  CARD at check-in to the Emergency Department and triage nurse.  Should you have questions after your visit or need to cancel or reschedule your appointment, please contact [facility]  Dept: [phone]  and follow the prompts.  Office hours are 8:00 a.m. to 4:30 p.m. Monday - Friday. Please note that voicemails left after 4:00 p.m. may not be returned until the following business day.  We are closed weekends and major holidays. You have access to a nurse at all times for urgent questions. Please call the main number to the clinic Dept: [phone] and follow the prompts.   For any non-urgent questions, you may also contact your provider using MyChart. We now offer e-Visits for anyone 53 and older to request care online for non-urgent symptoms. For details visit [email or web address].   Also download the MyChart app! Go to the app store, search "MyChart", open the app, select [facility], and log in with your MyChart username and password.  Due to Covid, a mask is required upon entering the hospital/clinic. If you do not have a mask, one will be given to you upon arrival. For doctor visits, patients may have 1 support person aged 70 or older with them. For treatment visits, patients cannot have anyone with them due to current Covid guidelines and our immunocompromised population.   Pembrolizumab injection ?? ??? ??????? ????????????? ????? ?? ??? ???? ????? ???????. ?????? ????? ????? ????? ?????????. ???? ??????? ??? ?????? ?????? ????? ???? ???? ??????? ?????? ?? ??????? ??????? ???? ?????. ????? ???????? ???????? ????????:? Keytruda ?? ?? ??????? ???? ??? ?? ???? ??? ???? ??????? ?????? ??? ????? ??? ??????? ?? ????? ??? ????? ?? ??? ??? ?????? ??? ?? ??? ??????? ????: ????? ??????? ???????? ??? ??? ???? ?? ?????? ??????? ??????? ?? ?????? ??? ???? ?? ???? ?????? ??? ??????? ??????? ??????? (?????? ??????? ??????? ?????? ???? ???) ????? ????? ??????? ????? ??  ??????? ??? ????? ????? ?????? ??????? ??? ????? ?????? ?????? ?? ??????? ????? ????? ?? ??? ??? ???? ?? ?????? ?? ????????????? ?? ??? ??? ???? ?? ?????? ?? ????? ???? ?? ??? ??? ???? ?? ?????? ?? ??????? ?? ??????? ?? ?????? ??????? ???? ?? ????? ????? ??????? ???????? ??? ??? ???? ??????? ??? ??????? ??? ?????? ???? ??????? ???? ????. ??? ????? ??? ?????? ?? ???? ?????? ????????? ?? ?????? ?? ?????. ????? ?? ???? ??? ??? ??? ??? ?? ????. ???? ?? ????? ??? ????????? ?????? ?? ?????. ???? ??? ???? ??????? ???? ??????? ??? ?????? ???????. ?? ??? ?? ??? ?????? ???? ?? ???? ??????? ????? ?????? ?? ?????  6 ???? ????? ????? ????? ?????? ?????? ???? ????? ?????????? ???????. ?????? ??????? : ??? ?????? ??? ?????? ???? ????? ???? ?? ??? ??????? ????????? ?????? ?? ?????? ?? ???? ??????? ?????. ??????: ?????? ??? ?????? ?? ???? ??? ???. ?? ????? ????? ??? ?? ????? ?????????. ???? ?? ???? (????) ????? ?? ????? ??? ???? ????. ???? ?????? ?? ?????? ??????? ?????? ??? ??? ??? ??????? ?????? ??? ????. ?? ?? ??????? ???? ?? ?????? ?? ??? ??????? ?? ???? ?????????. ??? ??????? ?? ?? ??? ?? ????????? ????????. ?? ?????? ???? ??????? ?????? ????? ??? ??????? ?? ??????? ?? ??????? ???? ???? ?? ???????? ???????? ???? ????????. ?????? ????? ??? ??? ???? ?? ???? ?????? ?? ?????? ?????? ??????????. ?? ?????? ??? ??????? ?? ?????. ?? ???? ??? ???? ????? ??? ??????? ??? ??????? ??? ??? ??????? ?? ??? ????? ???? ????? ??????. ??? ????? ??? ????? ???????? ?????? ???? ????? ?????? ???? ??????. ?? ?????? ????? ????? ????? ??? ?????? ?? ???? 4 ???? ??? ?????? ?? ??? ??????. ???? ??? ?????? ????? ??????? ??? ?? ????? ?? ????? ?? ?????? ??????. ???? ?????? ???? ???? ?????? ????? ??? ??????. ????? ??? ????? ?? ?????? ??????? ?????? ?? ??????? ?????? ??? ???? ?? ?????????. ?? ????? ???? ????? ????? ????????? ?? ???? 4 ???? ??? ??? ???? ???. ?? ?? ?????? ???????? ???? ???? ?? ??????? ??? ???? ??? ??????? ?????? ???????? ????  ??? ?? ???? ????? ?? ?????? ??????? ?????? ??? ?? ???? ???????: ???????? ??? ????? ?????? ?????? ??????????? (?????) ???? ????? ?? ?????? ?? ??????. ???? ???? ?? ???? ?? ???? ??? ??????? ????? ?????? ?????? ?? ?????? ??? ????? ??????? ???????? ????? ???? ????? ?????? ?? ?????? ?????? ?? ?????? ????? ????? ??????? ?? ??? ???????? ?????? ?????? ????? ????? ?? ???? ??????? ??? ??? ?????? ???? - ??? ?????? ?? ???? ?? ??? ??????? ??????? ?????? ???? ??????? ???????? ???????. ?? ???? ?????? ???? ?????? ?????? ???????. ????? ?? ???? ??????? ??? ??????? ??? ?? ????? ?? ??? ?? ?????? ?? ??????? ???? ?????? ?????? ?? ????? ?? ???? ?? ???? ?? ?????? ??? ?? ??? ????? ???? ???? ?????? ?????? ?????? ??? ???? ??? ??????? ???? ????? ???? ?????? ????? ??? ????? ????????? ???????? ???? ?????? ????? ?? ????? ????????? ?????? ?????? ?????? ?????? ????? ?????? ??? ????? ?????? ?? ?????? ?? ???? ????? ?????? ?????? ????? ?????? ??? ????? ?? ????? ?????? ?????? ?? ?????? ?????? ?? ????? ??????? ??? ??????? ???????? ??? ?? ????? ?????? ?????? ?? ?????? ?????? ??????? ?? ????? ??? ??? ????? ????????? ????? ????? ?????? ???????? ???? ?? ????? ????? ???? (???? ????? ?? ?????? ??????? ????????? ?????? ?? ???? ????? ?????): ????? ?????? ????? ????? ?????? ?????? ??? ??????? ?? ?? ??? ?? ?????? ???????? ????????. ???? ?????? ????????? ?????? ?? ?????? ????????. ????? ??????? ?? ?????? ???????? ?????? ??????? ????????????????? (FDA) ??? ????? ?[id number]??? ??? ??? ???? ???????? ??????? ??? ????? ??? ?????? ?? ???? ?????? ????? ???? ?? ?????? ?? ?????. ?? ????????? ??? ?????? ??????? ?? ??????. ??????: ??? ??????? ????? ?? ????: ?? ?? ???? ??? ???????? ?? ????????? ???????. ??? ???? ???? ?? ????? ?? ??? ??????? ????? ??? ?????? ?? ??????? ?????? ??????? ??????.  2022 Elsevier/Gold Standard (2020-02-11 00:00:00)  Doxorubicin injection ?? ??? ??????? ???????????? ?? ???????? ????????? ?? ?????? ????????  ??? ?????? ????? ??? ????? ?? ???????? ??? ????? ????? ?????? ????? ?????????? ???????????????? (????? ?????? ??????) ?????? ??????? ?????? (?????????)? ???? ?????. ?????? ????? ????? ????? ??????? ?????? ????? ?????? ????? ?????? ?????? ?????? ???????????? ????? ???????. ???? ??????? ??? ?????? ?????? ????? ???? ???? ??????? ?????? ?? ??????? ??????? ???? ?????. ????? ???????? ???????? ????????:?  Adriamycin, Adriamycin PFS, Adriamycin RDF,Rubex ?? ?? ??????? ???? ??? ?? ???? ??? ???? ??????? ?????? ??? ????? ??? ??????? ?? ????? ??? ????? ?? ??? ??? ?????? ??? ?? ??? ??????? ????: ????? ????? ????? ?? ?????? ??? ?????? ???? ???? ?????? ???? ????? ????? ???? ?????? ???? ?? ????? ?? ??? ??? ???? ?? ?????? ?? ???????????? ?? ????? ?????? ???????? ?????? ?? ??? ??? ???? ?? ?????? ?? ??????? ?????? ?? ??????? ?? ??????? ?? ?????? ??????? ???? ?? ????? ????? ??????? ???????? ??? ??? ???? ??????? ??? ??????? ??? ?????? ???? ??????? ???? ????. ???? ??? ?????? ?? ?????? ?? ????? ??? ?? ?????? ????? ???? ?????? ??????. ??? ????? ???? ?? ???? ?? ???? ?? ?? ????? ???????? ??? ????? ?????? ???? ????? ?? ?????? ??????? ?????? ??????. ???? ??? ???? ??????? ???? ??????? ??? ?????? ???????. ?? ???? ???? ???? ???????? ????. ?????? ??????? : ??? ?????? ??? ?????? ???? ????? ???? ?? ??? ??????? ????????? ?????? ?? ?????? ?? ???? ??????? ?????. ??????: ?????? ??? ?????? ?? ???? ??? ???. ?? ????? ????? ??? ?? ????? ?????????. ???? ?? ???? (????) ????? ?? ????? ??? ???? ????. ???? ?????? ?? ?????? ??????? ?????? ??? ??? ??? ??????? ?????? ??? ????. ?? ?? ??????? ???? ?? ?????? ?? ??? ??????? ??? ?????? ???? ?? ?????? ?? ??????? ???????: 6 -???????????????? ??????????? ???????? ???? ???? ???? ??????????? ????????? ??? ??????? ?? ?? ??? ?? ????????? ????????. ?? ?????? ???? ??????? ?????? ????? ??? ??????? ?? ??????? ?? ??????? ???? ???? ?? ???????? ???????? ???? ????????. ?????? ????? ??? ??? ???? ?? ????  ?????? ?? ?????? ?????? ??????????. ?? ?????? ??? ??????? ?? ?????. ?? ???? ??? ???? ????? ??? ??????? ??? ??????? ??? ?????? ?? ????? ???? ???? ????? ????? ???? ???. ?? ??? ??? ????? ??? ?????? ??? ?????? ???????? ???? ?? ???? ??? ??????? ?????? ??? ???? ??? ??????? ?????????. ???? ?? ?? ???? ??????. ????? ?? ???? ????? ??? ?? ???? ???? ??? ???????? ????? ???????. ???? ???? ???? ?? ????? ??? ?????? ??? ??????? ???? ???? ?????? ???????. ????? ??? ?????? ??? ?????? ?????? ??????? ???????? ?????? ?????? ??????. ?????? ????? ?????? ???? ???????? ?? ??? ?????? ???? ???? ?????. ???? ????? ??? ??? ???????? ??? ?????? ?? ??????. ??? ????? ??? ????? ???????? ?????? ???? ????? ?????? ???? ??????. ?? ????? ??? ???? ??? ?????? ????? ???? ??? ?????. ??? ??? ????. ??? ??? ?????????? ?? ?????? ???? ?????? ?? ?????? ??????? ??????. ?? ??? ???????? ?? ??? ?????? ????? ?????? ???????? ?? ???? ?????? ????????.???? ????????? ?????? ??????????. ????? ????? ?? ?????? ??????? ?????? ??? ???? ????? ?? ??????? ?? ??? ????? ?? ????? ???? ?? ????? ????? ?? ??????????. ?? ????? ???? ?????. ??? ?????? ?????? ?? ????? ???? ??? ?????? ??????. ???? ???? ???????? ???????? ??????. ??? ?????? ?? ???? ?? ????? ???? ??????? ?? ??????. ???? ?????? ?? ????????????? ?????? ??? ????? ?? ???? ??? ?????. ???? ??? ????? ?? ??? ?????? ????????. ?? ???? ?????? ????? ????? ???? ????????????? ????? ?? ??????? ??? ?????? ??? ??????. ????? ??? ?????? ?????? ??????? ??? ?????? ???? ????? ????? ????? ??? ?????? ????? 6 ???? ??? ?????? ?? ????? ??? ??????. ???? ??? ?????? ????? ??????? ??? ?? ????? ?? ????? ?? ?????? ??????. ????? ??? ?????? ??? ????? ??? ????? ????? ??? ?????? ????? 6 ???? ??? ?????? ?? ??????. ???? ?????? ???? ???? ?????? ????? ??? ??????. ????? ??? ????? ?? ?????? ??????? ?????? ?? ??????? ????????? ???? ?? ?????????. ?? ?????? ???? ????? ????? ??? ??????. ???? ??? ?????? ?? ??? ?????? ??? ??? ?????? ???? ?? ?????? ?????? ??? ???  ??????. ?? ?????? ??? ?????? ?? ?????? ??? ????? ???. ??? ??? ????? ???????????? ???? ??? ????? ?? ?????? ??????? ??????. ?? ????? ??? ?????? ?? ?????? ???? ??????? ???????  Q-10 ?? ?????. ?????? ???? ?? ????? ?? ????? ??? ???? ????? ?? ??????? ??????? Q-10 ????? ????? ??? ??????. ???? ??????? ???????????? ???? ???????? ?? ????? ?? ?????? ?????????????. ?? ?? ?????? ???????? ???? ???? ?? ??????? ??? ???? ??? ??????? ?????? ???????? ???? ??? ?? ???? ????? ?? ?????? ??????? ?????? ??? ?? ???? ???????: ???????? ??? ????? ?????? ?????? ??????????? (?????) ???? ????? ?? ?????? ?? ??????. ????? ?????? ??? ????? ????? ????? ??????? ?? ??? ???????? ??? ??? ?????? ???? - ??? ?????? ?? ???? ?? ??? ??????? ??????? ?????? ???? ??????? ???????? ???????. ?? ???? ?????? ???? ?????? ?????? ???????. ??? ?? ?????? ?? ??? ?? ?????? ?? ????? ?????? ?????? ?????? - ????? ?????????? ?? ?????? ?? ?????? ????? ?? ?????? ???? ?? ????? ?? ?????? ????? ??? ??????? ??????? ?? ?????? - ??????? ????? ??? ????? ??? ??? ?????? ??? ????? ??????? ?????? ??? ??????? ????? ?? ?? ????? ??? ?????? ?? ????? ?? ???? ?????? ?????? ??? ?????? ???????? ???? ?? ????? ????? ???? (???? ????? ?? ?????? ??????? ????????? ?????? ?? ???? ????? ?????): ????? ????? ????? ??? ???? ???? ?? ??? ??????? ??????? ???? ????? ?????? ?????? ??? ??? ??????? ?? ?? ??? ?? ?????? ???????? ????????. ???? ?????? ????????? ?????? ?? ?????? ????????. ????? ??????? ?? ?????? ???????? ?????? ??????? ????????????????? (FDA) ??? ????? ?[id number]??? ??? ??? ???? ???????? ??????? ??? ????? ??? ?????? ?? ???? ?????? ????? ???? ?? ?????? ?? ?????. ?? ????????? ??? ?????? ??????? ?? ??????. ??????: ??? ??????? ????? ?? ????: ?? ?? ???? ??? ???????? ?? ????????? ???????. ??? ???? ???? ?? ????? ?? ??? ??????? ????? ??? ?????? ?? ??????? ?????? ??????? ??????.  2022 Elsevier/Gold Standard (2017-08-01 00:00:00)  Cyclophosphamide Injection ?? ???  ??????? ??????????????? ?? ???????? ????????? ?? ?????? ????????. ???? ?? ??? ??????? ?????????. ?????? ??? ?????? ????? ????? ????? ?? ???????? ??? ????? ????? ????????? ?????? ?????? ?????? (?????????) ?? ????? ???? ?? ????? ????? ??????? ??????. ???? ??????? ??? ?????? ?????? ????? ???? ???? ??????? ?????? ?? ??????? ??????? ???? ?????. ????? ???????? ???????? ????????:? Cytoxan, Neosar ?? ?? ??????? ???? ??? ?? ???? ??? ???? ??????? ?????? ??? ????? ??? ??????? ?? ????? ??? ????? ?? ??? ??? ?????? ??? ?? ??? ??????? ????: ????? ????? ????? ?? ??? ?????? ????? ????? ???? ????? ????? ????? ????? ??? ??? ?????? ????? ??? ???? ???? ???????? ?? ??????? ???????? ?? ???? ???? ??????? ?? ?????? ?????? ???? ?????? ???? ?? ????? ????? ????? ???????? ?? ????? ???????? ????? ?? ?????? ?? ??? ??? ???? ?? ?????? ?? ????????????? ?? ??? ??? ???? ?? ?????? ?? ????? ???? ?? ??? ??? ???? ?? ?????? ?? ??????? ?? ??????? ?? ?????? ??????? ???? ?? ????? ????? ??????? ???????? ??? ??? ???? ??????? ??? ??????? ??? ?????? ???? ????? ???? ?????? ?? ????? ?? ??????? ???? ??????. ???? ????????? ?? ?????? ?? ????? ??? ?? ?????? ????? ???? ?????? ??????. ???? ??? ???? ??????? ???? ??????? ??? ?????? ???????. ?? ???? ???? ???? ???????? ????. ?????? ??????? : ??? ?????? ??? ?????? ???? ????? ???? ?? ??? ??????? ????????? ?????? ?? ?????? ?? ???? ??????? ?????. ??????: ?????? ??? ?????? ?? ???? ??? ???. ?? ????? ????? ??? ?? ????? ?????????. ???? ?? ???? (????) ????? ?? ????? ??? ???? ????. ???? ?????? ?? ?????? ??????? ?????? ??? ??? ??? ??????? ?????? ??? ????. ?? ?? ??????? ???? ?? ?????? ?? ??? ??????? ?????????? ? ?????????? ?????? ??????? ????? ????? ????? ??? ??????? ??????? ?? ?????? ????? ??? ????? ??? ???? ??????? ?? ????? ????? ?? ??? ?????? ??? ???? ????? ??? ??????? ???? ????? ?? ???? ?????? ???? ??? ????????. ??? ????? ??????? ?????? ??????????? ???????????? ??????????? ??????? ???? ????  ??????? ??????? ????? ????? ????? ???? ??????????? ??? ??????? ?? ?? ??? ?? ????????? ????????. ?? ?????? ???? ??????? ?????? ????? ??? ??????? ?? ??????? ?? ??????? ???? ???? ?? ???????? ???????? ???? ????????. ?????? ????? ??? ??? ???? ?? ???? ?????? ?? ?????? ?????? ??????????. ?? ?????? ??? ??????? ?? ?????. ?? ???? ??? ???? ????? ??? ??????? ??? ??????? ??? ??? ??????? ?? ??? ????? ???? ????? ??????. ??? ????? ??? ????? ???????? ?????? ???? ????? ?????? ???? ??????. ???? ????? ?? ??????? ?????? ??? ?????????. ???? ??????? ??? ?? ????? ?????. ??? ???????? ?? ????? ??? ??????. ???? ?????? ?? ?????? ??????? ?????? ??? ??? ??? ??? ?????? ????? ??? ??????. ???? ???? ??????? ??????? ?????? ??? ?????? ??? ??????. ??? ???????? ??? ???????????? ??????????? ???? ?? ???? ?? ??? ???? ????? ???? ?? ??????. ?????? ?? ????? ????????? ???????? ????????? ??????????????? ?????? ?????. ?? ????? ?? ????? ??  30 ????? ??? ??? ?????. ?? ?????? ????? ????? ????? ??? ?????? ?? ???? ??? ????? ??? ?????? ?? ??? ??????. ??? ??? ?????? ????? ?????? ??????? ?????? ??? ?? ????? ?? ????? ?? ?????? ??????. ????? ??? ?????? ??? ????? ??? ????? ????? ??? ?????? ????? 4 ???? ??? ?????? ?? ??? ??????. ???? ?????? ???? ???? ?????? ????? ??? ??????.????? ??? ????? ?? ?????? ??????? ?????? ?????? ??? ???? ?? ?????????. ?? ????? ?????? ????? ????? ??? ?????? ?? ???? ????? ???? ??? ?????? ?? ??????. ????? ??? ?????? ?? ??? ?????? ??? ??? ??????. ??? ?????? ???? ????? ????. ?????? ????? ???? ???????? ???? ??? ????? ?? ?????? ??????? ??????. ???? ??? ?????? ?? ????? ??? ????????? ??????? ??? ??? ??????. ??? ???? ????? ????? ????? ??????. ??? ??? ????? ???? ???????? ???? ??? ????? ?? ????????????? ??????. ????? ????? ?? ?????? ??????? ?????? ??? ???? ?????? ?? ???????? ?? ??? ?????? ?? ????? ???? ?? ????? ????? ?? ??????????. ?? ????? ???? ?????. ??? ?????? ?????? ?? ????? ???? ??? ?????? ??????. ???? ???? ???????? ???????? ??????. ????  ????? ??????? ???? ????? ??? ???????? ?? ?????????????? ?? ???????????? ?? ????????? ?? ?????????? ??? ??? ????? ?????? ?? ?????? ??????? ?????? ????. ?????????? ?? ???? ??????? ???????. ???? ??? ????? ?? ?????? ??????? ?????? ?? ??? ?????? ????????. ?? ???? ??????????? ????? ???? ??????? ?????? ????? ?? ??????? ??? ?????? ??? ??????. ??? ??? ?? ???? ?????? ?????? ?????? ?? ????? ??? ???? ????? ????? ?? ????????????? ?????? ???? ?????? ??? ??????. ???? ??? ?????? ?? ??????? ???? ??????? ?? ??????? ????? ??????? ???? ?? ???? ????? ?? ???? ?????? ????. ???? ???? ??????? ???? ?????? ??? ?????? ??? ????????? ????? ??? ??????. ?? ?? ?????? ???????? ???? ???? ?? ??????? ??? ???? ??? ??????? ?????? ???????? ???? ??? ?? ???? ????? ?? ?????? ??????? ?????? ??? ?? ???? ???????: ???????? ??? ????? ?????? ?????? ??????????? (?????) ???? ????? ?? ?????? ?? ??????. ????? ?????? ????? ?? ??? ?????? ?????? ?????? ??? ?????? ?????? ?? ?????? ??? ???????? ?? ??? ???? ?? ??? ????? ?? ????? ?? ?? ???? ???? ???? ?????? ???????? ?? ??????? ?? ??? ????? ??? ?????? ?? ????? ?? ???? ??? ?????? ?? ????? ?? ????? ?? ????? ?????? ?????? ???? ?????? ??? ????? ????? ??????? ???? ????????? ????? ????? ????????? ???? ???????? ???????? ??????? ?????? ?????? ?????? ??? ?????? ?? ?????????? ??????? ?????? ?????? ?? ?????? ????? ?? ????? ?? ?????? ?????? ?????? ????? ?????? ??? ????? ?????? ?? ?????? ?? ???? ????? ?????? ?????? ????? ?????? ??? ????? ?? ????? ?????? ?????? ?? ?????? ?????? ????? ?????? ?? ??????? ??????? ???????????? ?????? ?? ????? ??????? ??? ??????? ???????? ??? ?? ????? ?????? ?????? ?? ?????? ????? ?? ????? ??? ???????? ?????? ??????? ?? ????? ?????? ???????? ???? ?? ????? ????? ???? (???? ????? ?? ?????? ??????? ????????? ?????? ?? ???? ????? ?????): ???????? ????? ?? ????? ????? ?????? ????? ????? ????? ?????? ????? ?????? ???? ?????? ??????? ?????? ?????? ?????? ??? ????? ???? ??????? ?? ??? ????? ???  ??? ?? ??? ??? ?????? ?????? ??????? ?? ???????? ?????? ???? ??? ????? ??? ??????? ?? ?? ??? ?? ?????? ???????? ????????. ???? ?????? ????????? ?????? ?? ?????? ????????. ????? ??????? ?? ?????? ???????? ?????? ??????? ????????????????? (FDA) ??? ????? ?.[id number]??? ??? ??? ???? ???????? ??????? ??? ????? ??? ?????? ?? ???? ?????? ????? ???? ?? ?????? ?? ?????. ?? ????????? ??? ?????? ??????? ?? ??????. ??????: ??? ??????? ????? ?? ????: ?? ?? ???? ??? ???????? ?? ????????? ???????. ??? ???? ???? ?? ????? ?? ??? ??????? ????? ??? ?????? ?? ??????? ?????? ??????? ??????.  2022 Elsevier/Gold Standard (2020-02-11 00:00:00)

## 2021-04-11 NOTE — Patient Instructions (Signed)

## 2021-04-13 ENCOUNTER — Other Ambulatory Visit: Payer: Self-pay

## 2021-04-13 ENCOUNTER — Inpatient Hospital Stay: Payer: 59

## 2021-04-13 VITALS — BP 115/68 | HR 91 | Temp 98.3°F | Resp 18

## 2021-04-13 DIAGNOSIS — Z170 Estrogen receptor positive status [ER+]: Secondary | ICD-10-CM

## 2021-04-13 DIAGNOSIS — C50311 Malignant neoplasm of lower-inner quadrant of right female breast: Secondary | ICD-10-CM

## 2021-04-13 DIAGNOSIS — Z5112 Encounter for antineoplastic immunotherapy: Secondary | ICD-10-CM | POA: Diagnosis not present

## 2021-04-13 MED ORDER — PEGFILGRASTIM-CBQV 6 MG/0.6ML ~~LOC~~ SOSY
6.0000 mg | PREFILLED_SYRINGE | Freq: Once | SUBCUTANEOUS | Status: AC
  Administered 2021-04-13: 6 mg via SUBCUTANEOUS

## 2021-04-13 MED ORDER — PEGFILGRASTIM-CBQV 6 MG/0.6ML ~~LOC~~ SOSY
PREFILLED_SYRINGE | SUBCUTANEOUS | Status: AC
  Filled 2021-04-13: qty 0.6

## 2021-04-13 NOTE — Patient Instructions (Signed)

## 2021-04-28 ENCOUNTER — Other Ambulatory Visit: Payer: Self-pay

## 2021-04-28 ENCOUNTER — Inpatient Hospital Stay: Payer: 59 | Attending: Oncology | Admitting: Dietician

## 2021-04-28 DIAGNOSIS — C773 Secondary and unspecified malignant neoplasm of axilla and upper limb lymph nodes: Secondary | ICD-10-CM | POA: Insufficient documentation

## 2021-04-28 DIAGNOSIS — Z5112 Encounter for antineoplastic immunotherapy: Secondary | ICD-10-CM | POA: Insufficient documentation

## 2021-04-28 DIAGNOSIS — Z5111 Encounter for antineoplastic chemotherapy: Secondary | ICD-10-CM | POA: Insufficient documentation

## 2021-04-28 DIAGNOSIS — Z79899 Other long term (current) drug therapy: Secondary | ICD-10-CM | POA: Insufficient documentation

## 2021-04-28 DIAGNOSIS — C50311 Malignant neoplasm of lower-inner quadrant of right female breast: Secondary | ICD-10-CM | POA: Insufficient documentation

## 2021-04-28 DIAGNOSIS — Z171 Estrogen receptor negative status [ER-]: Secondary | ICD-10-CM | POA: Insufficient documentation

## 2021-04-28 NOTE — Progress Notes (Signed)
Nutrition Follow-up:   Patient with breast cancer. She is receiving neoadjuvant chemotherapy with pembrolizumab/carboplatin and paclitaxel.   Met with patient and sister in clinic, interpretor present this afternoon. Patient reports she is doing well. She has a good appetite and is eating as normal. Yesterday she had chicken soup, salad, cheese, homemade low-carb bread and 3 Ensure. Patient reports she is drinking lots of water. She is also eating cantaloupe, watermelon, and drinking hydration supplement made from beans to stay hydrated. She reports she is not checking her blood sugars. Patient reports extreme fatigue and nausea without vomiting lasting ~5 days after chemotherapy.    Medications: reviewed  Labs: 6/21 - Glucose 202  Anthropometrics: Weight 141 lb 1.6 oz increased from 138 lb 4.8 oz on 6/10 and 135 lb 12 oz on 6/1  5/24 - 134 lb 14.4 oz 5/17 - 130 lb 14.4 oz  NUTRITION DIAGNOSIS: Unintentional weight loss stable   INTERVENTION:  Reviewed strategies for blood sugar management, signs of hypoglycemia/hyperglycemia Discussed drinking Ensure supplement as needed with decreased intake  Complimentary Ensure Plus case provided today    MONITORING, EVALUATION, GOAL: weight trends, intake   NEXT VISIT: Tuesday, August 23 in infusion

## 2021-04-29 ENCOUNTER — Ambulatory Visit
Admission: RE | Admit: 2021-04-29 | Discharge: 2021-04-29 | Disposition: A | Discharge status: Home / Self Care | Payer: 59 | Source: Ambulatory Visit | Attending: Oncology | Admitting: Oncology

## 2021-04-29 DIAGNOSIS — C50311 Malignant neoplasm of lower-inner quadrant of right female breast: Secondary | ICD-10-CM

## 2021-05-01 ENCOUNTER — Other Ambulatory Visit: Payer: Self-pay | Admitting: Family Medicine

## 2021-05-02 NOTE — Progress Notes (Signed)
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 She is accompanied by her sister Rebecca [name] and an interpreter.  Rebecca [name] was switched to Doxorubicin, Cylophosphamide and Pembrolizumab on 03/22/2021 due to developing neuropathy.  She received Udenyca on day 3 (Neulasta biosimilar).  Today is her third of 4 planned cycles of this chemotherapy.  Her most recent echocardiogram on 01/31/2021 showed an ejection fraction of 60-65%.  She is scheduled for follow up breast MRI tomorrow, 05/04/2021.   REVIEW OF SYSTEMS: Rebecca [name] is tolerating treatment generally well except she does have some nausea and some bony aches after she gets her supportive shot.  She is gaining some weight and that concerns her.  She is very pleased that there is no palpable mass in the breast anymore.  Port is working well.  She sometimes does have mouth sores.  A detailed review of systems today was otherwise stable.  COVID 19 VACCINATION STATUS: fully vaccinated [employer]), with booster 12/2020   HISTORY OF CURRENT ILLNESS: From the original intake note:  Rebecca [name] herself palpated a mass in the right axilla. She underwent bilateral diagnostic mammography with tomography and right breast ultrasonography at [facility] on 01/03/2021 showing: breast density category B; two adjacent suspicious right breast masses at 5 o'clock; markedly abnormal right axillary lymph node; no evidence of malignancy on left.  Accordingly on 01/06/2021 she proceeded to biopsy of the right breast area in question. The pathology from this procedure ([id number]) showed: invasive ductal carcinoma, grade 3. Prognostic indicators significant for: estrogen receptor, 10% positive with weak staining intensity and progesterone receptor, 0% negative. Proliferation marker Ki67 at 90%. HER2 equivocal by immunohistochemistry (2+), but negative by fluorescent in situ hybridization with a signals ratio 1.38 and number per cell 3.63.  Cancer Staging Malignant neoplasm of lower-inner quadrant of right breast of female, estrogen receptor positive ([place]) Staging form: Breast, AJCC 8th Edition - Clinical: Stage IIIB (cT2, cN1, cM0, G3, ER-, PR-, HER2-) - Signed by Rebecca Cruel, MD on 01/18/2021 Stage prefix: Initial diagnosis Histologic grading system: 3 grade system  [name] is as detailed below.   PAST MEDICAL HISTORY: Past Medical History:  Diagnosis Date   Arthritis    Breast cancer ([place])    Diabetes mellitus    Family history of lymphoma 01/18/2021   Family history of throat cancer 01/18/2021   Hyperlipidemia    Hypertension     PAST SURGICAL HISTORY: Past Surgical History:  Procedure Laterality Date   CESAREAN SECTION     PORTACATH PLACEMENT N/A 01/26/2021   Procedure: INSERTION PORT-A-CATH;  Surgeon: Rebecca Luna, MD;  Location: [facility];  Service: General;  Laterality: N/A;  50    FAMILY HISTORY: Family History  Problem Relation Age of Onset   Diabetes Mother    Heart disease Mother    Diabetes Father    Throat cancer Father  d. 23s   Diabetes Sister    Diabetes Brother    Lymphoma Cousin        maternal cousin; d. 14s   Her father died in his 15's and her mother in her 59's, both from  complications of diabetes. Rebecca [name] has 6 brothers and 1 sister. There is no family history of breast or ovarian cancer to her knowledge.   GYNECOLOGIC HISTORY:  No LMP recorded. Menarche: 55 years old [place] P 0 LMP 10/31/2020 (before that, 06/2020) Contraceptive used from 2017-2018 HRT n/a  Hysterectomy? no BSO? no   SOCIAL HISTORY: (updated 12/2020)  Rebecca [name] is originally from [place]. 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 She is divorced. She lives at home with her nephew Rebecca [name], who is 16 and works for [employer]. She attends [employer]    ADVANCED DIRECTIVES: not in place; at the 01/18/2021 visit she stated her ex-husband Rebecca [name], who is disabled due to past injuries, [phone] would be the person to contact if she became incapacitated [the patient states aside from her nephew she has no other relatives in [place]: Social History   Tobacco Use   Smoking status: Passive Smoke Exposure - Never Smoker   Smokeless tobacco: Never  Substance Use Topics   Alcohol use: No   Drug use: No     Colonoscopy: never done, referral in place  PAP: 12/2020, negative  Bone density: never done   Allergies  Allergen Reactions   Lisinopril Swelling    Swelling in throat, coughing, difficulty swallowing, could not sleep. Occurred Dec 2020.    Current Outpatient Medications  Medication Sig Dispense Refill   valACYclovir (VALTREX) 500 MG tablet Take 1 tablet (500 mg total) by mouth daily. 30 tablet 0   amLODipine (NORVASC) 10 MG tablet TAKE 1 TABLET BY MOUTH EVERY DAY 90 tablet 0   Ascorbic Acid (VITAMIN C) 1000 MG tablet Take 1 tablet (1,000 mg total) by mouth daily. 30 tablet 0   atorvastatin (LIPITOR) 80 MG tablet TAKE 1 TABLET (80 MG TOTAL) BY MOUTH DAILY. 90 tablet 1   Blood Glucose Monitoring Suppl (ONETOUCH VERIO REFLECT) w/Device KIT 1 kit by Does not apply route  in the morning, at noon, and at bedtime. Use as directed to test blood sugar up to three times daily.E11.9 1 kit 0   cetirizine (ZYRTEC) 10 MG tablet Take 1 tablet (10 mg total) by mouth daily. (Patient not taking: Reported on 03/31/2021) 30 tablet 0   cloNIDine (CATAPRES) 0.1 MG tablet Take 1 tablet (0.1 mg total) by mouth at bedtime as needed. (Patient not taking: Reported on 03/31/2021) 90 tablet 1   clotrimazole (CLOTRIMAZOLE AF) 1 % cream Apply 1 application topically 2 (two) times daily. 30 g 1   cyclobenzaprine (FLEXERIL) 10 MG tablet Take 10 mg by mouth 3 (three) times daily as needed for muscle spasms.     dapagliflozin propanediol (FARXIGA) 10 MG TABS tablet Take 1 tablet (10 mg total) by mouth daily before breakfast. 90 tablet 1   dexamethasone (DECADRON) 4 MG tablet Take 1 tablet (4 mg total) by mouth 2 (two) times daily. Take the day after chemo and the day after that, then stop 30 tablet 1   ergocalciferol (DRISDOL) 1.25 MG (50000 UT) capsule Take 1 capsule (50,000 Units total) by mouth once a week. 12 capsule 0   glipiZIDE (GLUCOTROL) 5 MG tablet TAKE 1 TABLET BY  MOUTH DAILY (Patient taking differently: Take 5 mg by mouth daily before breakfast.) 90 tablet 1   glucose blood (ONETOUCH VERIO) test strip Use as directed to test blood sugar up to three times daily. E11.9 100 each 6   Lancets (ONETOUCH DELICA PLUS PPIRJJ88C) MISC 1 Device by Does not apply route in the morning, at noon, and at bedtime. Use as directed to test blood sugar up to three times daily. E11.9 100 each 6   lidocaine-prilocaine (EMLA) cream APPLY TO AFFECTED AREA AS DIRECTED 30 g 3   LORazepam (ATIVAN) 0.5 MG tablet Take 1 tablet by mouth at bedtime. 30 tablet 0   metFORMIN (GLUCOPHAGE) 500 MG tablet TAKE 1 TABLET BY MOUTH TWICE DAILY 180 tablet 1   naproxen (NAPROSYN) 500 MG tablet Take 1 tablet (500 mg total) by mouth 2 (two) times daily with a meal. 30 tablet 1   Omega-3 1000 MG CAPS Take 1 capsule (1,000 mg total)  by mouth daily. 30 capsule 0   omeprazole (PRILOSEC) 20 MG capsule Take 1 capsule (20 mg total) by mouth daily. 30 capsule 0   oxyCODONE (OXY IR/ROXICODONE) 5 MG immediate release tablet Take 1 tablet (5 mg total) by mouth every 6 (six) hours as needed for severe pain. 15 tablet 0   polyethylene glycol (MIRALAX / GLYCOLAX) 17 g packet TAKE 17 G BY MOUTH DAILY. 14 each 0   prochlorperazine (COMPAZINE) 10 MG tablet TAKE 1 TABLET BY MOUTH EVERY 6 HOURS AS NEEDED FOR NAUSEA OR VOMITING 30 tablet 1   vitamin B-12 (CYANOCOBALAMIN) 100 MCG tablet Take 1 tablet (100 mcg total) by mouth daily. 30 tablet 0   No current facility-administered medications for this visit.    OBJECTIVE: Rebecca [name] woman in no acute distress  Vitals:   05/03/21 0808  BP: 121/77  Pulse: 97  Resp: 18  Temp: 98.1 F (36.7 C)  SpO2: 100%     Body mass index is 25.05 kg/m.   Wt Readings from Last 3 Encounters:  05/03/21 141 lb 6.4 oz (64.1 kg)  04/11/21 141 lb 1.6 oz (64 kg)  03/31/21 138 lb 4.8 oz (62.7 kg)      ECOG FS:1 - Symptomatic but completely ambulatory  Sclerae unicteric, EOMs intact Wearing a mask No cervical or supraclavicular adenopathy Lungs no rales or rhonchi Heart regular rate and rhythm Abd soft, nontender, positive bowel sounds MSK no focal spinal tenderness, no upper extremity lymphedema Neuro: nonfocal, well oriented, appropriate affect Breasts: I do not palpate a mass in the right breast.  There are no skin or nipple changes of concern.  The left breast and both axillae are benign   LAB RESULTS:  CMP     Component Value Date/Time   NA 141 05/03/2021 0745   NA 140 12/27/2020 1123   K 3.7 05/03/2021 0745   CL 101 05/03/2021 0745   CO2 26 05/03/2021 0745   GLUCOSE 222 (H) 05/03/2021 0745   BUN 12 05/03/2021 0745   BUN 11 12/27/2020 1123   CREATININE 0.66 05/03/2021 0745   CREATININE 0.50 11/02/2016 1102   CALCIUM 9.9 05/03/2021 0745   PROT 7.2 05/03/2021 0745   PROT 7.5  12/27/2020 1123   ALBUMIN 3.8 05/03/2021 0745   ALBUMIN 4.7 12/27/2020 1123   AST 13 (L) 05/03/2021 0745   ALT 13 05/03/2021 0745   ALKPHOS 82 05/03/2021 0745   BILITOT 0.3 05/03/2021 0745   GFRNONAA >60 05/03/2021 0745   GFRNONAA >89 01/16/2016 1006   GFRAA 118  06/02/2020 0942   GFRAA >89 01/16/2016 1006    No results found for: TOTALPROTELP, ALBUMINELP, A1GS, A2GS, BETS, BETA2SER, GAMS, MSPIKE, SPEI  Lab Results  Component Value Date   WBC 6.9 05/03/2021   NEUTROABS 4.3 05/03/2021   HGB 11.0 (L) 05/03/2021   HCT 33.8 (L) 05/03/2021   MCV 84.1 05/03/2021   PLT 291 05/03/2021    No results found for: LABCA2  No components found for: [id number]  No results for input(s): INR in the last 168 hours.  No results found for: LABCA2  No results found for: YQM578  No results found for: ION629  No results found for: BMW413  No results found for: CA2729  No components found for: HGQUANT  No results found for: CEA1 / No results found for: CEA1   No results found for: AFPTUMOR  No results found for: CHROMOGRNA  No results found for: KPAFRELGTCHN, LAMBDASER, KAPLAMBRATIO (kappa/lambda light chains)  No results found for: HGBA, HGBA2QUANT, HGBFQUANT, HGBSQUAN (Hemoglobinopathy evaluation)   No results found for: LDH  No results found for: IRON, TIBC, IRONPCTSAT (Iron and TIBC)  No results found for: FERRITIN  Urinalysis    Component Value Date/Time   COLORURINE YELLOW 06/24/2007 0116   APPEARANCEUR CLOUDY (A) 06/24/2007 0116   LABSPEC 1.025 09/18/2020 1125   PHURINE 5.5 09/18/2020 1125   GLUCOSEU 100 (A) 09/18/2020 1125   HGBUR TRACE (A) 09/18/2020 1125   BILIRUBINUR NEGATIVE 09/18/2020 1125   KETONESUR NEGATIVE 09/18/2020 1125   PROTEINUR NEGATIVE 09/18/2020 1125   UROBILINOGEN 0.2 09/18/2020 1125   NITRITE NEGATIVE 09/18/2020 1125   LEUKOCYTESUR NEGATIVE 09/18/2020 1125    STUDIES: No results found.   ELIGIBLE FOR AVAILABLE RESEARCH PROTOCOL:  no  ASSESSMENT: 55 y.o. [place] woman status post right breast lower inner quadrant biopsy 01/06/2021 for a clinically T2 N1, stage IIIB functionally triple negative invasive ductal carcinoma, grade 3, with and MIB-1 of 90%  (a) chest CT scan 01/29/2021 shows no evidence of metastatic disease  (b) bone scan 02/01/2021 shows no evidence of metastatic disease  (1) neoadjuvant chemotherapy will consist of pembrolizumab given day 1 together with carboplatin and paclitaxel, with Taxol given on day 8 and 15, and filgrastim on days 16,17 and 18, repeated every 21 days x 4, followed by doxorubicin and cyclophosphamide every 21 days x 4 also given with pembrolizumab on day 1 and PEG filgrastim day 3  (a) carboplatin/paclitaxel discontinued after the cycle 2-day 8 dose with neuropathy developing  (b) doxorubicin/cyclophosphamide started 03/21/2021  (c) pembrolizumab continuing to total 1 year  (2) definitive surgery to follow  (3) adjuvant radiation  (4) genetics testing 01/31/2021 through the [facility] cancer next-expanded plus RNA insight panel found no deleterious mutations in the 77 genes tested.   PLAN: Noma will proceed to her third cycle of doxorubicin and cyclophosphamide today.  She will receive her immune shot in 2days.  She was scheduled for an MRI this week but that is too soon so we are moving that until after her fourth cycle, which will be 05/23/2021.  Hopefully we can get the MRI done the following week, perhaps 05/30/2021 and then she can proceed to surgery from there.  She understands she will continue the [facility] every 3 weeks for an additional 6 months.  Note that her peripheral neuropathy has entirely resolved and therefore if we needed to give her chemotherapy postop we would be able to return to taxanes.  However I think were going to get a very good radiologic and  pathologic result.  Today I refilled her dexamethasone, oxycodone, and wrote for Valtrex.  She knows to call  for any other issue that may develop before the next visit  Total encounter time 25 minutes.   Rebecca [name]. Elpidia Karn, MD 05/03/21 8:51 AM Medical Oncology and Hematology [facility] [place], [place] [address] Tel. [phone]    Fax. [phone]   I, Wilburn Mylar, am acting as scribe for Dr. Virgie [name]. Treyson Axel.  I, Lurline Del MD, have reviewed the above documentation for accuracy and completeness, and I agree with the above.   *Total Encounter Time as defined by the Centers for Medicare and Medicaid Services includes, in addition to the face-to-face time of a patient visit (documented in the note above) non-face-to-face time: obtaining and reviewing outside history, ordering and reviewing medications, tests or procedures, care coordination (communications with other health care professionals or caregivers) and documentation in the medical record.

## 2021-05-03 ENCOUNTER — Inpatient Hospital Stay: Payer: 59

## 2021-05-03 ENCOUNTER — Other Ambulatory Visit: Payer: Self-pay

## 2021-05-03 ENCOUNTER — Encounter: Payer: Self-pay | Admitting: *Deleted

## 2021-05-03 ENCOUNTER — Inpatient Hospital Stay (HOSPITAL_BASED_OUTPATIENT_CLINIC_OR_DEPARTMENT_OTHER): Payer: 59 | Admitting: Oncology

## 2021-05-03 ENCOUNTER — Encounter: Payer: 59 | Admitting: Dietician

## 2021-05-03 ENCOUNTER — Encounter: Payer: Self-pay | Admitting: Oncology

## 2021-05-03 VITALS — BP 121/77 | HR 97 | Temp 98.1°F | Resp 18 | Ht 63.0 in | Wt 141.4 lb

## 2021-05-03 DIAGNOSIS — Z170 Estrogen receptor positive status [ER+]: Secondary | ICD-10-CM | POA: Diagnosis not present

## 2021-05-03 DIAGNOSIS — C50311 Malignant neoplasm of lower-inner quadrant of right female breast: Secondary | ICD-10-CM

## 2021-05-03 DIAGNOSIS — Z5112 Encounter for antineoplastic immunotherapy: Secondary | ICD-10-CM | POA: Diagnosis present

## 2021-05-03 DIAGNOSIS — Z95828 Presence of other vascular implants and grafts: Secondary | ICD-10-CM

## 2021-05-03 DIAGNOSIS — C773 Secondary and unspecified malignant neoplasm of axilla and upper limb lymph nodes: Secondary | ICD-10-CM | POA: Diagnosis not present

## 2021-05-03 DIAGNOSIS — Z5111 Encounter for antineoplastic chemotherapy: Secondary | ICD-10-CM | POA: Diagnosis present

## 2021-05-03 DIAGNOSIS — Z171 Estrogen receptor negative status [ER-]: Secondary | ICD-10-CM | POA: Diagnosis not present

## 2021-05-03 DIAGNOSIS — Z79899 Other long term (current) drug therapy: Secondary | ICD-10-CM | POA: Diagnosis not present

## 2021-05-03 LAB — CMP (CANCER CENTER ONLY)
ALT: 13 U/L (ref 0–44)
AST: 13 U/L — ABNORMAL LOW (ref 15–41)
Albumin: 3.8 g/dL (ref 3.5–5.0)
Alkaline Phosphatase: 82 U/L (ref 38–126)
Anion gap: 14 (ref 5–15)
BUN: 12 mg/dL (ref 6–20)
CO2: 26 mmol/L (ref 22–32)
Calcium: 9.9 mg/dL (ref 8.9–10.3)
Chloride: 101 mmol/L (ref 98–111)
Creatinine: 0.66 mg/dL (ref 0.44–1.00)
GFR, Estimated: >60 mL/min (ref >60)
Glucose, Bld: 222 mg/dL — ABNORMAL HIGH (ref 70–99)
Potassium: 3.7 mmol/L (ref 3.5–5.1)
Sodium: 141 mmol/L (ref 135–145)
Total Bilirubin: 0.3 mg/dL (ref 0.3–1.2)
Total Protein: 7.2 g/dL (ref 6.5–8.1)

## 2021-05-03 LAB — CBC WITH DIFFERENTIAL (CANCER CENTER ONLY)
Abs Immature Granulocytes: 0.11 10*3/uL — ABNORMAL HIGH (ref 0.00–0.07)
Basophils Absolute: 0.1 10*3/uL (ref 0.0–0.1)
Basophils Relative: 1 %
Eosinophils Absolute: 0.3 10*3/uL (ref 0.0–0.5)
Eosinophils Relative: 4 %
HCT: 33.8 % — ABNORMAL LOW (ref 36.0–46.0)
Hemoglobin: 11 g/dL — ABNORMAL LOW (ref 12.0–15.0)
Immature Granulocytes: 2 %
Lymphocytes Relative: 20 %
Lymphs Abs: 1.4 10*3/uL (ref 0.7–4.0)
MCH: 27.4 pg (ref 26.0–34.0)
MCHC: 32.5 g/dL (ref 30.0–36.0)
MCV: 84.1 fL (ref 80.0–100.0)
Monocytes Absolute: 0.8 10*3/uL (ref 0.1–1.0)
Monocytes Relative: 11 %
Neutro Abs: 4.3 10*3/uL (ref 1.7–7.7)
Neutrophils Relative %: 62 %
Platelet Count: 291 10*3/uL (ref 150–400)
RBC: 4.02 MIL/uL (ref 3.87–5.11)
RDW: 19.9 % — ABNORMAL HIGH (ref 11.5–15.5)
WBC Count: 6.9 10*3/uL (ref 4.0–10.5)
nRBC: 0.4 % — ABNORMAL HIGH (ref 0.0–0.2)

## 2021-05-03 MED ORDER — SODIUM CHLORIDE 0.9% FLUSH
10.0000 mL | INTRAVENOUS | Status: DC | PRN
  Administered 2021-05-03: 10 mL
  Filled 2021-05-03: qty 10

## 2021-05-03 MED ORDER — PALONOSETRON HCL INJECTION 0.25 MG/5ML
INTRAVENOUS | Status: AC
  Filled 2021-05-03: qty 5

## 2021-05-03 MED ORDER — OXYCODONE HCL 5 MG PO TABS: 5.0000 mg | ORAL_TABLET | Freq: Four times a day (QID) | ORAL | 0 refills | Status: DC | PRN

## 2021-05-03 MED ORDER — ANTICOAGULANT SODIUM CITRATE LOCK FLUSH 4% (120 MG/3ML) IV SOLN
5.0000 mL | PREFILLED_SYRINGE | INTRAVENOUS | Status: DC | PRN
  Administered 2021-05-03: 5 mL
  Filled 2021-05-03: qty 6

## 2021-05-03 MED ORDER — SODIUM CHLORIDE 0.9 % IV SOLN
150.0000 mg | Freq: Once | INTRAVENOUS | Status: AC
  Administered 2021-05-03: 150 mg via INTRAVENOUS
  Filled 2021-05-03: qty 150

## 2021-05-03 MED ORDER — SODIUM CHLORIDE 0.9% FLUSH
10.0000 mL | Freq: Once | INTRAVENOUS | Status: AC
Start: 2021-05-03 — End: 2021-05-03
  Administered 2021-05-03: 10 mL
  Filled 2021-05-03: qty 10

## 2021-05-03 MED ORDER — DOXORUBICIN HCL CHEMO IV INJECTION 2 MG/ML
60.0000 mg/m2 | Freq: Once | INTRAVENOUS | Status: AC
  Administered 2021-05-03: 100 mg via INTRAVENOUS
  Filled 2021-05-03: qty 50

## 2021-05-03 MED ORDER — SODIUM CHLORIDE 0.9 % IV SOLN
200.0000 mg | Freq: Once | INTRAVENOUS | Status: AC
  Administered 2021-05-03: 200 mg via INTRAVENOUS
  Filled 2021-05-03: qty 8

## 2021-05-03 MED ORDER — SODIUM CHLORIDE 0.9 % IV SOLN
600.0000 mg/m2 | Freq: Once | INTRAVENOUS | Status: AC
  Administered 2021-05-03: 1000 mg via INTRAVENOUS
  Filled 2021-05-03: qty 50

## 2021-05-03 MED ORDER — VALACYCLOVIR HCL 500 MG PO TABS: 500.0000 mg | ORAL_TABLET | Freq: Every day | ORAL | 0 refills | Status: DC

## 2021-05-03 MED ORDER — SODIUM CHLORIDE 0.9 % IV SOLN
Freq: Once | INTRAVENOUS | Status: AC
  Filled 2021-05-03: qty 250

## 2021-05-03 MED ORDER — SODIUM CHLORIDE 0.9 % IV SOLN
10.0000 mg | Freq: Once | INTRAVENOUS | Status: AC
  Administered 2021-05-03: 10 mg via INTRAVENOUS
  Filled 2021-05-03: qty 10

## 2021-05-03 MED ORDER — ANTICOAGULANT SODIUM CITRATE 4% (200MG/5ML) IV SOLN: 5.0000 mL | Freq: Once | Status: DC

## 2021-05-03 MED ORDER — DEXAMETHASONE 4 MG PO TABS: 4.0000 mg | ORAL_TABLET | Freq: Two times a day (BID) | ORAL | 1 refills | Status: DC

## 2021-05-03 MED ORDER — PALONOSETRON HCL INJECTION 0.25 MG/5ML
0.2500 mg | Freq: Once | INTRAVENOUS | Status: AC
  Administered 2021-05-03: 0.25 mg via INTRAVENOUS

## 2021-05-03 NOTE — Patient Instructions (Addendum)
[facility]  Discharge Instructions: Thank you for choosing [facility] to provide your oncology and hematology care.   If you have a lab appointment with the [facility], please go directly to the [facility] and check in at the registration area.   Wear comfortable clothing and clothing appropriate for easy access to any Portacath or PICC line.   We strive to give you quality time with your provider. You may need to reschedule your appointment if you arrive late (15 or more minutes).  Arriving late affects you and other patients whose appointments are after yours.  Also, if you miss three or more appointments without notifying the office, you may be dismissed from the clinic at the provider's discretion.      For prescription refill requests, have your pharmacy contact our office and allow 72 hours for refills to be completed.    Today you received the following chemotherapy and/or immunotherapy agents: Keytruda, Adriamycin, Cytoxan      To help prevent nausea and vomiting after your treatment, we encourage you to take your nausea medication as directed.  BELOW ARE SYMPTOMS THAT SHOULD BE REPORTED IMMEDIATELY: *FEVER GREATER THAN 100.4 F (38 C) OR HIGHER *CHILLS OR SWEATING *NAUSEA AND VOMITING THAT IS NOT CONTROLLED WITH YOUR NAUSEA MEDICATION *UNUSUAL SHORTNESS OF BREATH *UNUSUAL BRUISING OR BLEEDING *URINARY PROBLEMS (pain or burning when urinating, or frequent urination) *BOWEL PROBLEMS (unusual diarrhea, constipation, pain near the anus) TENDERNESS IN MOUTH AND THROAT WITH OR WITHOUT PRESENCE OF ULCERS (sore throat, sores in mouth, or a toothache) UNUSUAL RASH, SWELLING OR PAIN  UNUSUAL VAGINAL DISCHARGE OR ITCHING   Items with * indicate a potential emergency and should be followed up as soon as possible or go to the Emergency Department if any problems should occur.  Please show the CHEMOTHERAPY ALERT CARD or IMMUNOTHERAPY ALERT  CARD at check-in to the Emergency Department and triage nurse.  Should you have questions after your visit or need to cancel or reschedule your appointment, please contact [facility]  Dept: [phone]  and follow the prompts.  Office hours are 8:00 a.m. to 4:30 p.m. Monday - Friday. Please note that voicemails left after 4:00 p.m. may not be returned until the following business day.  We are closed weekends and major holidays. You have access to a nurse at all times for urgent questions. Please call the main number to the clinic Dept: [phone] and follow the prompts.   For any non-urgent questions, you may also contact your provider using MyChart. We now offer e-Visits for anyone 75 and older to request care online for non-urgent symptoms. For details visit [email or web address].   Also download the MyChart app! Go to the app store, search "MyChart", open the app, select [facility], and log in with your MyChart username and password.  Due to Covid, a mask is required upon entering the hospital/clinic. If you do not have a mask, one will be given to you upon arrival. For doctor visits, patients may have 1 support person aged 28 or older with them. For treatment visits, patients cannot have anyone with them due to current Covid guidelines and our immunocompromised population.

## 2021-05-04 ENCOUNTER — Other Ambulatory Visit: Payer: 59

## 2021-05-05 ENCOUNTER — Inpatient Hospital Stay: Payer: 59

## 2021-05-05 ENCOUNTER — Other Ambulatory Visit: Payer: Self-pay

## 2021-05-05 ENCOUNTER — Encounter: Payer: Self-pay | Admitting: *Deleted

## 2021-05-05 VITALS — BP 125/76 | HR 95 | Temp 97.9°F | Resp 18

## 2021-05-05 DIAGNOSIS — Z5112 Encounter for antineoplastic immunotherapy: Secondary | ICD-10-CM | POA: Diagnosis not present

## 2021-05-05 DIAGNOSIS — C50311 Malignant neoplasm of lower-inner quadrant of right female breast: Secondary | ICD-10-CM

## 2021-05-05 MED ORDER — PEGFILGRASTIM-CBQV 6 MG/0.6ML ~~LOC~~ SOSY
6.0000 mg | PREFILLED_SYRINGE | Freq: Once | SUBCUTANEOUS | Status: AC
  Administered 2021-05-05: 6 mg via SUBCUTANEOUS

## 2021-05-05 MED ORDER — PEGFILGRASTIM-CBQV 6 MG/0.6ML ~~LOC~~ SOSY
PREFILLED_SYRINGE | SUBCUTANEOUS | Status: AC
  Filled 2021-05-05: qty 0.6

## 2021-05-08 ENCOUNTER — Encounter: Payer: Self-pay | Admitting: *Deleted

## 2021-05-12 ENCOUNTER — Other Ambulatory Visit (HOSPITAL_COMMUNITY): Payer: Self-pay

## 2021-05-12 ENCOUNTER — Encounter: Payer: Self-pay | Admitting: Oncology

## 2021-05-13 ENCOUNTER — Other Ambulatory Visit: Payer: Self-pay | Admitting: Family Medicine

## 2021-05-13 DIAGNOSIS — E119 Type 2 diabetes mellitus without complications: Secondary | ICD-10-CM

## 2021-05-18 ENCOUNTER — Other Ambulatory Visit: Payer: Self-pay | Admitting: Family Medicine

## 2021-05-18 DIAGNOSIS — E785 Hyperlipidemia, unspecified: Secondary | ICD-10-CM

## 2021-05-18 DIAGNOSIS — E1169 Type 2 diabetes mellitus with other specified complication: Secondary | ICD-10-CM

## 2021-05-18 NOTE — Telephone Encounter (Signed)
Requested Prescriptions  Pending Prescriptions Disp Refills  . atorvastatin (LIPITOR) 80 MG tablet [Pharmacy Med Name: ATORVASTATIN '80MG'$  TABLETS] 90 tablet 1    Sig: TAKE 1 TABLET BY MOUTH EVERY DAY     Cardiovascular:  Antilipid - Statins Failed - 05/18/2021  3:34 AM      Failed - Triglycerides in normal range and within 360 days    Triglycerides  Date Value Ref Range Status  11/11/2020 178 (H) 0 - 149 mg/dL Final         Passed - Total Cholesterol in normal range and within 360 days    Cholesterol, Total  Date Value Ref Range Status  11/11/2020 116 100 - 199 mg/dL Final         Passed - LDL in normal range and within 360 days    LDL Chol Calc (NIH)  Date Value Ref Range Status  11/11/2020 41 0 - 99 mg/dL Final         Passed - HDL in normal range and within 360 days    HDL  Date Value Ref Range Status  11/11/2020 46 >39 mg/dL Final         Passed - Patient is not pregnant      Passed - Valid encounter within last 12 months    Recent Outpatient Visits          4 months ago Annual physical exam   [place], [place], MD   6 months ago Vasomotor symptoms due to menopause   [place], [place]o, MD   11 months ago Type 2 diabetes mellitus without complication, without long-term current use of insulin [facility])   [place], Annie Main L, RPH-CPP   11 months ago Type 2 diabetes mellitus without complication, without long-term current use of insulin [facility])   [place] Ausdall, Jarome Matin, RPH-CPP   11 months ago Myalgia   [facility] [name], MD

## 2021-05-19 ENCOUNTER — Other Ambulatory Visit: Payer: Self-pay | Admitting: Family Medicine

## 2021-05-19 NOTE — Telephone Encounter (Signed)
Requested medications are due for refill today yes  Requested medications are on the active medication list yes  Last refill 5/3  Last visit 3/8, was to return 4/9 but has not.  Future visit scheduled no  Notes to clinic Did not return in April, please assess.

## 2021-05-22 NOTE — Progress Notes (Signed)
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 She is accompanied by her sister Rebecca [name] and an interpreter.  Rebecca [name] was switched to Doxorubicin, Cylophosphamide and Pembrolizumab on 03/22/2021 due to developing neuropathy--which however she now denies (there may have been a communication issue).  She receives [place] on day 3 (Neulasta biosimilar).  Today is her final of 4 planned cycles of this chemotherapy.  Her baseline echocardiogram on 01/31/2021 showed an ejection fraction of 60-65%.  She is scheduled for follow up breast MRI on 05/25/2021.  REVIEW OF SYSTEMS: Rebecca [name] is tolerating treatment well.  She says she is eating well has a good sense of taste.  She is trying to walk 30 minutes a day.  Sometimes she gets a little bit of muscle pain when she does not but her toes and fingers in particular are "okay".  She has a little bit of discomfort in the right breast when she puts on her bra.  A detailed review of systems today was otherwise stable  COVID 19 VACCINATION STATUS: fully vaccinated [employer]), with booster 12/2020   HISTORY OF CURRENT ILLNESS: From the original intake note:  Rebecca [name] herself palpated a mass in the right axilla. She underwent bilateral diagnostic  mammography with tomography and right breast ultrasonography at [facility] on 01/03/2021 showing: breast density category B; two adjacent suspicious right breast masses at 5 o'clock; markedly abnormal right axillary lymph node; no evidence of malignancy on left.  Accordingly on 01/06/2021 she proceeded to biopsy of the right breast area in question. The pathology from this procedure ([id number]) showed: invasive ductal carcinoma, grade 3. Prognostic indicators significant for: estrogen receptor, 10% positive with weak staining intensity and progesterone receptor, 0% negative. Proliferation marker Ki67 at 90%. HER2 equivocal by immunohistochemistry (2+), but negative by fluorescent in situ hybridization with a signals ratio 1.38 and number per cell 3.63.  Cancer Staging Malignant neoplasm of lower-inner quadrant of right breast of female, estrogen receptor positive ([place]) Staging form: Breast, AJCC 8th Edition - Clinical: Stage IIIB (cT2, cN1, cM0, G3, ER-, PR-, HER2-) - Signed by Chauncey Cruel, MD on 01/18/2021 Stage prefix: Initial diagnosis Histologic grading system: 3 grade system  [name] is as detailed below.   PAST MEDICAL HISTORY: Past Medical History:  Diagnosis Date   Arthritis    Breast cancer ([place])    Diabetes mellitus    Family history of lymphoma 01/18/2021   Family history of throat cancer 01/18/2021   Hyperlipidemia    Hypertension     PAST SURGICAL HISTORY: Past Surgical History:  Procedure Laterality Date   CESAREAN SECTION     PORTACATH PLACEMENT N/A 01/26/2021   Procedure: INSERTION PORT-A-CATH;  Surgeon: Erroll Luna, MD;  Location: [facility] OR;  Service: General;  Laterality: N/A;  62    FAMILY HISTORY: Family History  Problem Relation Age of Onset  Diabetes Mother    Heart disease Mother    Diabetes Father    Throat cancer Father        d. 66s   Diabetes Sister    Diabetes Brother    Lymphoma Cousin        maternal cousin;  d. 64s   Her father died in his 65's and her mother in her 91'M, both from complications of diabetes. Rebecca [name] has 6 brothers and 1 sister. There is no family history of breast or ovarian cancer to her knowledge.   GYNECOLOGIC HISTORY:  No LMP recorded. Menarche: 55 years old [place] P 0 LMP 10/31/2020 (before that, 06/2020) Contraceptive used from 2017-2018 HRT n/a  Hysterectomy? no BSO? no   SOCIAL HISTORY: (updated 12/2020)  Rebecca [name] is originally from [place]. 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 She is divorced. She lives at home with her nephew Rebecca [name], who is 42 and works for [employer]. She attends [employer]).    ADVANCED DIRECTIVES: not in place; at the 01/18/2021 visit she stated her ex-husband Rebecca [name], who is disabled due to past injuries, ([phone]) would be the person to contact if she became incapacitated [the patient states aside from her nephew she has no other relatives in [place]: Social History   Tobacco Use   Smoking status: Passive Smoke Exposure - Never Smoker   Smokeless tobacco: Never  Substance Use Topics   Alcohol use: No   Drug use: No     Colonoscopy: never done, referral in place  PAP: 12/2020, negative  Bone density: never done   Allergies  Allergen Reactions   Lisinopril Swelling    Swelling in throat, coughing, difficulty swallowing, could not sleep. Occurred Dec 2020.    Current Outpatient Medications  Medication Sig Dispense Refill   amLODipine (NORVASC) 10 MG tablet TAKE 1 TABLET BY MOUTH EVERY DAY 90 tablet 0   Ascorbic Acid (VITAMIN C) 1000 MG tablet Take 1 tablet (1,000 mg total) by mouth daily. 30 tablet 0   atorvastatin (LIPITOR) 80 MG tablet TAKE 1 TABLET BY MOUTH EVERY DAY 90 tablet 1   Blood Glucose Monitoring Suppl (ONETOUCH VERIO REFLECT) w/Device KIT 1 kit by Does not apply route in the morning, at noon, and at  bedtime. Use as directed to test blood sugar up to three times daily.E11.9 1 kit 0   cetirizine (ZYRTEC) 10 MG tablet Take 1 tablet (10 mg total) by mouth daily. (Patient not taking: Reported on 03/31/2021) 30 tablet 0   cloNIDine (CATAPRES) 0.1 MG tablet Take 1 tablet (0.1 mg total) by mouth at bedtime as needed. (Patient not taking: Reported on 03/31/2021) 90 tablet 1   clotrimazole (CLOTRIMAZOLE AF) 1 % cream Apply 1 application topically 2 (two) times daily. 30 g 1   cyclobenzaprine (FLEXERIL) 10 MG tablet Take 10 mg by mouth 3 (three) times daily as needed for muscle spasms.     dexamethasone (DECADRON) 4 MG tablet Take 1 tablet (4 mg total) by mouth 2 (two) times daily. Take the day after chemo and the day after that, then stop 30 tablet 1   ergocalciferol (DRISDOL) 1.25 MG (50000 UT) capsule Take 1 capsule (50,000 Units total) by mouth once a week. 12 capsule 0   FARXIGA 10 MG TABS tablet TAKE 1 TABLET BY MOUTH EVERY MORNING WITH BREAKFAST 30 tablet 0   glipiZIDE (GLUCOTROL) 5 MG tablet TAKE 1 TABLET  BY MOUTH DAILY 90 tablet 0   glucose blood (ONETOUCH VERIO) test strip Use as directed to test blood sugar up to three times daily. E11.9 100 each 6   Lancets (ONETOUCH DELICA PLUS [id number]) MISC 1 Device by Does not apply route in the morning, at noon, and at bedtime. Use as directed to test blood sugar up to three times daily. E11.9 100 each 6   lidocaine-prilocaine (EMLA) cream APPLY TO AFFECTED AREA AS DIRECTED 30 g 3   LORazepam (ATIVAN) 0.5 MG tablet Take 1 tablet by mouth at bedtime. 30 tablet 0   metFORMIN (GLUCOPHAGE) 500 MG tablet TAKE 1 TABLET BY MOUTH TWICE DAILY 180 tablet 1   naproxen (NAPROSYN) 500 MG tablet Take 1 tablet (500 mg total) by mouth 2 (two) times daily with a meal. 30 tablet 1   Omega-3 1000 MG CAPS Take 1 capsule (1,000 mg total) by mouth daily. 30 capsule 0   omeprazole (PRILOSEC) 20 MG capsule Take 1 capsule (20 mg total) by mouth daily. 30 capsule 0   oxyCODONE (OXY  IR/ROXICODONE) 5 MG immediate release tablet Take 1 tablet (5 mg total) by mouth every 6 (six) hours as needed for severe pain. 15 tablet 0   polyethylene glycol (MIRALAX / GLYCOLAX) 17 g packet TAKE 17 G BY MOUTH DAILY. 14 each 0   prochlorperazine (COMPAZINE) 10 MG tablet TAKE 1 TABLET BY MOUTH EVERY 6 HOURS AS NEEDED FOR NAUSEA OR VOMITING 30 tablet 1   valACYclovir (VALTREX) 500 MG tablet Take 1 tablet (500 mg total) by mouth daily. 30 tablet 0   vitamin B-12 (CYANOCOBALAMIN) 100 MCG tablet Take 1 tablet (100 mcg total) by mouth daily. 30 tablet 0   No current facility-administered medications for this visit.    OBJECTIVE: Rebecca [name] [name] in no acute distress  Vitals:   05/23/21 0840  BP: 121/72  Pulse: 96  Resp: 18  Temp: 97.7 F (36.5 C)  SpO2: 100%     Body mass index is 25.1 kg/m.   Wt Readings from Last 3 Encounters:  05/23/21 141 lb 11.2 oz (64.3 kg)  05/03/21 141 lb 6.4 oz (64.1 kg)  04/11/21 141 lb 1.6 oz (64 kg)      ECOG FS:1 - Symptomatic but completely ambulatory  Sclerae unicteric, EOMs intact Wearing a mask No cervical or supraclavicular adenopathy Lungs no rales or rhonchi Heart regular rate and rhythm Abd soft, nontender, positive bowel sounds MSK no focal spinal tenderness, no upper extremity lymphedema Neuro: nonfocal, well oriented, appropriate affect Breasts: I do not palpate a mass in the right breast.  There are no skin or nipple changes of concern.  The left breast and both axillae are benign.   LAB RESULTS:  CMP     Component Value Date/Time   NA 141 05/03/2021 0745   NA 140 12/27/2020 1123   K 3.7 05/03/2021 0745   CL 101 05/03/2021 0745   CO2 26 05/03/2021 0745   GLUCOSE 222 (H) 05/03/2021 0745   BUN 12 05/03/2021 0745   BUN 11 12/27/2020 1123   CREATININE 0.66 05/03/2021 0745   CREATININE 0.50 11/02/2016 1102   CALCIUM 9.9 05/03/2021 0745   PROT 7.2 05/03/2021 0745   PROT 7.5 12/27/2020 1123   ALBUMIN 3.8 05/03/2021 0745    ALBUMIN 4.7 12/27/2020 1123   AST 13 (L) 05/03/2021 0745   ALT 13 05/03/2021 0745   ALKPHOS 82 05/03/2021 0745   BILITOT 0.3 05/03/2021 0745   GFRNONAA >60 05/03/2021 0745  GFRNONAA >89 01/16/2016 1006   GFRAA 118 06/02/2020 0942   GFRAA >89 01/16/2016 1006    No results found for: TOTALPROTELP, ALBUMINELP, A1GS, A2GS, BETS, BETA2SER, GAMS, MSPIKE, SPEI  Lab Results  Component Value Date   WBC 5.9 05/23/2021   NEUTROABS 3.8 05/23/2021   HGB 11.4 (L) 05/23/2021   HCT 34.6 (L) 05/23/2021   MCV 85.9 05/23/2021   PLT 248 05/23/2021    No results found for: LABCA2  No components found for: DGLOVF643  No results for input(s): INR in the last 168 hours.  No results found for: LABCA2  No results found for: PIR518  No results found for: ACZ660  No results found for: YTK160  No results found for: CA2729  No components found for: HGQUANT  No results found for: CEA1 / No results found for: CEA1   No results found for: AFPTUMOR  No results found for: CHROMOGRNA  No results found for: KPAFRELGTCHN, LAMBDASER, KAPLAMBRATIO (kappa/lambda light chains)  No results found for: HGBA, HGBA2QUANT, HGBFQUANT, HGBSQUAN (Hemoglobinopathy evaluation)   No results found for: LDH  No results found for: IRON, TIBC, IRONPCTSAT (Iron and TIBC)  No results found for: FERRITIN  Urinalysis    Component Value Date/Time   COLORURINE YELLOW 06/24/2007 0116   APPEARANCEUR CLOUDY (A) 06/24/2007 0116   LABSPEC 1.025 09/18/2020 1125   PHURINE 5.5 09/18/2020 1125   GLUCOSEU 100 (A) 09/18/2020 1125   HGBUR TRACE (A) 09/18/2020 1125   BILIRUBINUR NEGATIVE 09/18/2020 1125   KETONESUR NEGATIVE 09/18/2020 1125   PROTEINUR NEGATIVE 09/18/2020 1125   UROBILINOGEN 0.2 09/18/2020 1125   NITRITE NEGATIVE 09/18/2020 1125   LEUKOCYTESUR NEGATIVE 09/18/2020 1125    STUDIES: No results found.   ELIGIBLE FOR AVAILABLE RESEARCH PROTOCOL: no  ASSESSMENT: 55 y.o. 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 ductal carcinoma, grade 3, with and MIB-1 of 90%  (a) chest CT scan 01/29/2021 shows no evidence of metastatic disease  (b) bone scan 02/01/2021 shows no evidence of metastatic disease  (1) neoadjuvant chemotherapy consisting of pembrolizumab given day 1 together with carboplatin and paclitaxel, with Taxol given on day 8 and 15, and filgrastim on days 16,17 and 18, started 02/07/2021, repeated every 21 days x 2, followed by doxorubicin and cyclophosphamide every 21 days x 4 also given with pembrolizumab on day 1 and PEG filgrastim day 3, started 03/22/2021 and completed 05/23/2021  (a) carboplatin/paclitaxel discontinued after the cycle 2-day 8 dose with neuropathy developing (subsequently denied by patient)   (2) pembrolizumab continuing to total 1 year  (3) definitive surgery to follow  (4) adjuvant radiation  (5) genetics testing 01/31/2021 through the [facility] Panel found no deleterious mutations in AIP, ALK, APC, ATM, AXIN2, BAP1, BARD1, BLM, BMPR1A, BRCA1, BRCA2, BRIP1, CDC73, CDH1, CDK4, CDKN1B, CDKN2A, CHEK2, CTNNA1, DICER1, FANCC, FH, FLCN, GALNT12, KIF1B, LZTR1, MAX, MEN1, MET, MLH1, MSH2, MSH3, MSH6, MUTYH, NBN, NF1, NF2, NTHL1, PALB2, PHOX2B, PMS2, POT1, PRKAR1A, PTCH1, PTEN, RAD51C, RAD51D, RB1, RECQL, RET, SDHA, SDHAF2, SDHB, SDHC, SDHD, SMAD4, SMARCA4, SMARCB1, SMARCE1, STK11, SUFU, TMEM127, TP53, TSC1, TSC2, VHL and XRCC2 (sequencing and deletion/duplication); EGFR, EGLN1, HOXB13, KIT, MITF, PDGFRA, POLD1, and POLE (sequencing only); EPCAM and GREM1 (deletion/duplication only).  PLAN: Saleema will complete her planned chemotherapy today.  She understands that the plan is to continue pembrolizumab for another year and so we will be keeping her port.  She understands that this  is not chemo but immunotherapy.  On the other hand if there  is significant residual disease after her surgery we could consider giving her the 2 cycles of carboplatin and paclitaxel that we omitted because of neuropathy.  It appears the neuropathy was more miscommunication than anything else as she currently clearly denies it.  She is already set up for MRI of the breast later this week.  She will see her surgeon within the next couple of weeks to plan her surgery.  She is going to return to see me in 3 weeks with her pembrolizumab dose on that day  They know to call for any other issue that may develop before the next visit  Total encounter time 25 minutes.Sarajane Jews C. Chailyn Racette, MD 05/23/21 8:58 AM Medical Oncology and Hematology [facility], [place] [address] Tel. [phone]    Fax. [phone]   I, Wilburn Mylar, am acting as scribe for Dr. Virgie Dad. Rebecca [name].  I, Lurline Del MD, have reviewed the above documentation for accuracy and completeness, and I agree with the above.   *Total Encounter Time as defined by the Centers for Medicare and Medicaid Services includes, in addition to the face-to-face time of a patient visit (documented in the note above) non-face-to-face time: obtaining and reviewing outside history, ordering and reviewing medications, tests or procedures, care coordination (communications with other health care professionals or caregivers) and documentation in the medical record.

## 2021-05-23 ENCOUNTER — Other Ambulatory Visit: Payer: Self-pay

## 2021-05-23 ENCOUNTER — Inpatient Hospital Stay: Payer: 59

## 2021-05-23 ENCOUNTER — Encounter: Payer: Self-pay | Admitting: *Deleted

## 2021-05-23 ENCOUNTER — Inpatient Hospital Stay: Payer: 59 | Attending: Oncology

## 2021-05-23 ENCOUNTER — Inpatient Hospital Stay (HOSPITAL_BASED_OUTPATIENT_CLINIC_OR_DEPARTMENT_OTHER): Payer: 59 | Admitting: Oncology

## 2021-05-23 VITALS — BP 121/72 | HR 96 | Temp 97.7°F | Resp 18 | Ht 63.0 in | Wt 141.7 lb

## 2021-05-23 DIAGNOSIS — C50311 Malignant neoplasm of lower-inner quadrant of right female breast: Secondary | ICD-10-CM

## 2021-05-23 DIAGNOSIS — Z5112 Encounter for antineoplastic immunotherapy: Secondary | ICD-10-CM | POA: Insufficient documentation

## 2021-05-23 DIAGNOSIS — Z79899 Other long term (current) drug therapy: Secondary | ICD-10-CM | POA: Insufficient documentation

## 2021-05-23 DIAGNOSIS — Z170 Estrogen receptor positive status [ER+]: Secondary | ICD-10-CM

## 2021-05-23 DIAGNOSIS — Z171 Estrogen receptor negative status [ER-]: Secondary | ICD-10-CM | POA: Diagnosis not present

## 2021-05-23 DIAGNOSIS — Z95828 Presence of other vascular implants and grafts: Secondary | ICD-10-CM

## 2021-05-23 DIAGNOSIS — Z5111 Encounter for antineoplastic chemotherapy: Secondary | ICD-10-CM | POA: Insufficient documentation

## 2021-05-23 DIAGNOSIS — Z5189 Encounter for other specified aftercare: Secondary | ICD-10-CM | POA: Insufficient documentation

## 2021-05-23 LAB — CBC WITH DIFFERENTIAL (CANCER CENTER ONLY)
Abs Immature Granulocytes: 0.06 10*3/uL (ref 0.00–0.07)
Basophils Absolute: 0 10*3/uL (ref 0.0–0.1)
Basophils Relative: 1 %
Eosinophils Absolute: 0.1 10*3/uL (ref 0.0–0.5)
Eosinophils Relative: 2 %
HCT: 34.6 % — ABNORMAL LOW (ref 36.0–46.0)
Hemoglobin: 11.4 g/dL — ABNORMAL LOW (ref 12.0–15.0)
Immature Granulocytes: 1 %
Lymphocytes Relative: 21 %
Lymphs Abs: 1.3 10*3/uL (ref 0.7–4.0)
MCH: 28.3 pg (ref 26.0–34.0)
MCHC: 32.9 g/dL (ref 30.0–36.0)
MCV: 85.9 fL (ref 80.0–100.0)
Monocytes Absolute: 0.7 10*3/uL (ref 0.1–1.0)
Monocytes Relative: 12 %
Neutro Abs: 3.8 10*3/uL (ref 1.7–7.7)
Neutrophils Relative %: 63 %
Platelet Count: 248 10*3/uL (ref 150–400)
RBC: 4.03 MIL/uL (ref 3.87–5.11)
RDW: 18.6 % — ABNORMAL HIGH (ref 11.5–15.5)
WBC Count: 5.9 10*3/uL (ref 4.0–10.5)
nRBC: 0.3 % — ABNORMAL HIGH (ref 0.0–0.2)

## 2021-05-23 LAB — CMP (CANCER CENTER ONLY)
ALT: 23 U/L (ref 0–44)
AST: 18 U/L (ref 15–41)
Albumin: 4.1 g/dL (ref 3.5–5.0)
Alkaline Phosphatase: 92 U/L (ref 38–126)
Anion gap: 12 (ref 5–15)
BUN: 10 mg/dL (ref 6–20)
CO2: 26 mmol/L (ref 22–32)
Calcium: 10.1 mg/dL (ref 8.9–10.3)
Chloride: 102 mmol/L (ref 98–111)
Creatinine: 0.66 mg/dL (ref 0.44–1.00)
GFR, Estimated: >60 mL/min (ref >60)
Glucose, Bld: 226 mg/dL — ABNORMAL HIGH (ref 70–99)
Potassium: 3.9 mmol/L (ref 3.5–5.1)
Sodium: 140 mmol/L (ref 135–145)
Total Bilirubin: 0.3 mg/dL (ref 0.3–1.2)
Total Protein: 7.4 g/dL (ref 6.5–8.1)

## 2021-05-23 MED ORDER — SODIUM CHLORIDE 0.9% FLUSH
10.0000 mL | Freq: Once | INTRAVENOUS | Status: AC
  Administered 2021-05-23: 10 mL
  Filled 2021-05-23: qty 10

## 2021-05-23 MED ORDER — SODIUM CHLORIDE 0.9 % IV SOLN
200.0000 mg | Freq: Once | INTRAVENOUS | Status: AC
  Administered 2021-05-23: 200 mg via INTRAVENOUS
  Filled 2021-05-23: qty 8

## 2021-05-23 MED ORDER — DOXORUBICIN HCL CHEMO IV INJECTION 2 MG/ML
60.0000 mg/m2 | Freq: Once | INTRAVENOUS | Status: AC
  Administered 2021-05-23: 100 mg via INTRAVENOUS
  Filled 2021-05-23: qty 50

## 2021-05-23 MED ORDER — SODIUM CHLORIDE 0.9 % IV SOLN
150.0000 mg | Freq: Once | INTRAVENOUS | Status: AC
  Administered 2021-05-23: 150 mg via INTRAVENOUS
  Filled 2021-05-23: qty 150

## 2021-05-23 MED ORDER — PALONOSETRON HCL INJECTION 0.25 MG/5ML
0.2500 mg | Freq: Once | INTRAVENOUS | Status: AC
  Administered 2021-05-23: 0.25 mg via INTRAVENOUS

## 2021-05-23 MED ORDER — SODIUM CHLORIDE 0.9 % IV SOLN
Freq: Once | INTRAVENOUS | Status: AC
  Filled 2021-05-23: qty 250

## 2021-05-23 MED ORDER — SODIUM CHLORIDE 0.9% FLUSH
10.0000 mL | INTRAVENOUS | Status: DC | PRN
  Administered 2021-05-23: 10 mL
  Filled 2021-05-23: qty 10

## 2021-05-23 MED ORDER — SODIUM CHLORIDE 0.9 % IV SOLN
600.0000 mg/m2 | Freq: Once | INTRAVENOUS | Status: AC
  Administered 2021-05-23: 1000 mg via INTRAVENOUS
  Filled 2021-05-23: qty 50

## 2021-05-23 MED ORDER — FAMOTIDINE 20 MG IN NS 100 ML IVPB
INTRAVENOUS | Status: AC
  Filled 2021-05-23: qty 100

## 2021-05-23 MED ORDER — SODIUM CHLORIDE 0.9 % IV SOLN
10.0000 mg | Freq: Once | INTRAVENOUS | Status: AC
  Administered 2021-05-23: 10 mg via INTRAVENOUS
  Filled 2021-05-23: qty 10

## 2021-05-23 MED ORDER — PALONOSETRON HCL INJECTION 0.25 MG/5ML
INTRAVENOUS | Status: AC
  Filled 2021-05-23: qty 5

## 2021-05-23 MED ORDER — FAMOTIDINE 20 MG IN NS 100 ML IVPB
20.0000 mg | Freq: Once | INTRAVENOUS | Status: AC
  Administered 2021-05-23: 20 mg via INTRAVENOUS

## 2021-05-23 NOTE — Patient Instructions (Signed)
[facility]   Discharge Instructions: Thank you for choosing [facility] to provide your oncology and hematology care.   If you have a lab appointment with the [facility], please go directly to the [facility] and check in at the registration area.   Wear comfortable clothing and clothing appropriate for easy access to any Portacath or PICC line.   We strive to give you quality time with your provider. You may need to reschedule your appointment if you arrive late (15 or more minutes).  Arriving late affects you and other patients whose appointments are after yours.  Also, if you miss three or more appointments without notifying the office, you may be dismissed from the clinic at the provider's discretion.      For prescription refill requests, have your pharmacy contact our office and allow 72 hours for refills to be completed.    Today you received the following chemotherapy and/or immunotherapy agents: pembrolizumab, doxorubicin, and cyclophosphamide.      To help prevent nausea and vomiting after your treatment, we encourage you to take your nausea medication as directed.  BELOW ARE SYMPTOMS THAT SHOULD BE REPORTED IMMEDIATELY: *FEVER GREATER THAN 100.4 F (38 C) OR HIGHER *CHILLS OR SWEATING *NAUSEA AND VOMITING THAT IS NOT CONTROLLED WITH YOUR NAUSEA MEDICATION *UNUSUAL SHORTNESS OF BREATH *UNUSUAL BRUISING OR BLEEDING *URINARY PROBLEMS (pain or burning when urinating, or frequent urination) *BOWEL PROBLEMS (unusual diarrhea, constipation, pain near the anus) TENDERNESS IN MOUTH AND THROAT WITH OR WITHOUT PRESENCE OF ULCERS (sore throat, sores in mouth, or a toothache) UNUSUAL RASH, SWELLING OR PAIN  UNUSUAL VAGINAL DISCHARGE OR ITCHING   Items with * indicate a potential emergency and should be followed up as soon as possible or go to the Emergency Department if any problems should occur.  Please show the CHEMOTHERAPY ALERT CARD  or IMMUNOTHERAPY ALERT CARD at check-in to the Emergency Department and triage nurse.  Should you have questions after your visit or need to cancel or reschedule your appointment, please contact [facility]  Dept: [phone]  and follow the prompts.  Office hours are 8:00 a.m. to 4:30 p.m. Monday - Friday. Please note that voicemails left after 4:00 p.m. may not be returned until the following business day.  We are closed weekends and major holidays. You have access to a nurse at all times for urgent questions. Please call the main number to the clinic Dept: [phone] and follow the prompts.   For any non-urgent questions, you may also contact your provider using MyChart. We now offer e-Visits for anyone 59 and older to request care online for non-urgent symptoms. For details visit [email or web address].   Also download the MyChart app! Go to the app store, search "MyChart", open the app, select [facility], and log in with your MyChart username and password.  Due to Covid, a mask is required upon entering the hospital/clinic. If you do not have a mask, one will be given to you upon arrival. For doctor visits, patients may have 1 support person aged 59 or older with them. For treatment visits, patients cannot have anyone with them due to current Covid guidelines and our immunocompromised population.

## 2021-05-23 NOTE — Progress Notes (Signed)
At time of discharge, patient began c/o acute onset of nausea. Denies abdominal pain or other symptoms. Shearon Balo, RN received orders for IV famotidine. After receiving famotidine, patient reports she believes her sensation is arising from hunger. Does not wish for further treatment.

## 2021-05-24 LAB — TSH: TSH: 1.053 u[IU]/mL (ref 0.308–3.960)

## 2021-05-25 ENCOUNTER — Ambulatory Visit
Admission: RE | Admit: 2021-05-25 | Discharge: 2021-05-25 | Disposition: A | Discharge status: Home / Self Care | Payer: 59 | Source: Ambulatory Visit | Attending: Oncology | Admitting: Oncology

## 2021-05-25 ENCOUNTER — Other Ambulatory Visit: Payer: Self-pay

## 2021-05-25 ENCOUNTER — Inpatient Hospital Stay: Payer: 59

## 2021-05-25 VITALS — BP 125/77 | HR 93 | Temp 98.9°F | Resp 16

## 2021-05-25 DIAGNOSIS — Z170 Estrogen receptor positive status [ER+]: Secondary | ICD-10-CM

## 2021-05-25 DIAGNOSIS — Z5112 Encounter for antineoplastic immunotherapy: Secondary | ICD-10-CM | POA: Diagnosis not present

## 2021-05-25 MED ORDER — GADOBUTROL 1 MMOL/ML IV SOLN
7.0000 mL | Freq: Once | INTRAVENOUS | Status: AC | PRN
  Administered 2021-05-25: 7 mL via INTRAVENOUS

## 2021-05-25 MED ORDER — PEGFILGRASTIM-CBQV 6 MG/0.6ML ~~LOC~~ SOSY
PREFILLED_SYRINGE | SUBCUTANEOUS | Status: AC
  Filled 2021-05-25: qty 0.6

## 2021-05-25 MED ORDER — PEGFILGRASTIM-CBQV 6 MG/0.6ML ~~LOC~~ SOSY
6.0000 mg | PREFILLED_SYRINGE | Freq: Once | SUBCUTANEOUS | Status: AC
  Administered 2021-05-25: 6 mg via SUBCUTANEOUS

## 2021-05-25 NOTE — Patient Instructions (Addendum)
Pegfilgrastim injection ?? ??? ??????? ????????????? ?? ???? ????? ???? ??????? ???????? ??????? ??????? ????? ?????? ??? ??????? ???? ?? ??? ?? ????? ???? ??????? ??? ??????? ?? ?????? ????? ?? ??????. ??? ?????? ???? ?? ??????? ?????? ??????? ?? ?????? ???????? ?????? ????? ?? ??????? ??????? ?????? ????????? ???? ??? ???? ?????? ??????? ?????????? ??? ?????? ?????? ????? ?? ???????. ???? ??????? ??? ?????? ?????? ????? ???? ???? ??????? ?????? ?? ??????? ??????? ???? ?????. ????? ???????? ???????? ????????:? Fulphila, Neulasta, Nyvepria, UDENYCA,Ziextenzo ?? ?? ??????? ???? ??? ?? ???? ??? ???? ??????? ?????? ??? ????? ??? ??????? ?? ????? ??? ????? ?? ??? ??? ?????? ??? ?? ??? ??????? ????: ????? ????? ???????? ???? ??????? ???? ?????? ????? ???????? ???????? ??????? ????? ???? ?????? ??????? ??????????? (???? ????? ??? ????? ???) ?? ??? ??? ????? ?? ?????? ?? ????????????? ?? ?????????? ?? ??? ??? ???? ?? ?????? ?? ??????? ?????? ?? ??????? ?? ??????? ?? ?????? ??????? ???? ?? ????? ????? ??????? ???????? ??? ??? ???? ??????? ??? ??????? ??? ?????? ???? ????? ??? ?????. ??? ??? ????? ??? ?????? ?? ??????? ????? ?????? ????? ????? ?????? ?????? ???????? ?????? ?? ????? ??????? ???? ????? ??? ?????. ???? ??? ??????? ????????? ?????? ??????? ??????? ??? ????????? ?????????. ?????? ??? ?? ????? ??????. ???? ????? ?? ?????? ??????? ?????? ??? ????? ??? ??? ??? ?? ??? ???? "???? ????? ??? ?????" ?????? ?????? ?? ??? ?????? ?? ?? ??????? "???? ????? ??? ?????" ?? ??? ??? ???? ????? ?? ?????. ?? ????? ?? ??? ????? ?????? ????????? ?? ???? ???? ?????? ?? ??????? ??????. ?? ????? ?? ????? ?????. ??? ?? ??? ???? ???? ??????? ??????? ???? ??????? ???????? ??????? ?????? ?????? ??? ?????. ???? ??? ???? ??????? ???? ??????? ??? ?????? ???????. ?? ??? ?? ??? ?????????? ???? ?????? ?????? ??? ??? ???? ????? ?????????? ???????. ?????? ??????? : ??? ?????? ??? ?????? ???? ????? ???? ?? ??? ??????? ?????????  ?????? ?? ?????? ?? ???? ??????? ?????. ??????: ?????? ??? ?????? ?? ???? ??? ???. ?? ????? ????? ??? ?? ????? ?????????. ???? ?? ???? (????) ????? ?? ????? ??? ???? ????. ??? ????? ????? ???? ?????? ?? ??????? ??????? ??????. ??? ????? ???? ???? ???? ??? ?? ????? ?? ???? ????? ??? ?????? ??? ????? ???? ????? ?? ???? ??? ???? ???????? ???? ????? ?????? ?????? ?????? ???????????????. ?? ?? ??????? ???? ?? ?????? ?? ??? ??????? ?? ???? ?????????. ??? ??????? ?? ?? ??? ?? ????????? ????????. ?? ?????? ???? ??????? ?????? ????? ??? ??????? ?? ??????? ?? ??????? ???? ???? ?? ???????? ???????? ???? ????????. ?????? ????? ??? ??? ???? ?? ???? ?????? ?? ?????? ?????? ??????????. ?? ?????? ??? ??????? ?? ?????. ?? ???? ??? ???? ????? ??? ??????? ??? ??????? ??? ??? ??????? ?? ??? ????? ???? ????? ??????. ??? ????? ??? ????? ???????? ?????? ???? ????? ?????? ???? ??????. ???? ??? ????? ?? ?????? ??????? ?????? ?? ??? ?????? ????????. ?? ???? ??????????? ????? ???? ??????? ?????? ????? ?? ??????? ??? ?????? ??? ??????. ??? ??? ?? ???? ?????? ???? ??????? ?????????? ?? ???????? ??????? ??????? ???? ????? ???? ????? ????? ???? ?????? ??? ?????? (???? ????? ??? ????? ???). ?? ?? ?????? ???????? ???? ???? ?? ??????? ??? ???? ??? ??????? ?????? ???????? ???? ??? ?? ???? ????? ?? ?????? ??????? ?????? ??? ?? ???? ???????: ??????? ???????? (????? ??????? ?? ?????? ?? ?????????? (?????)? ???? ????? ?? ?????? ?? ??????) ??? ????? ?????? ?????? ??? ?? ?????? ?? ??? ?? ?????? ?? ????? ?????? ??? ????? ???? ?????? ??? ????? ???? ????? ?????? ?????? ?? ????? ?????? ??? ????? ?? ????? ?? ?????? ??? ?????? ?? ???? ????? ?? ??? ?? ????? ??? ?????? ?????? ????? ?????? ?? ???? ?? ???? ????? ???? ?? ????? ??? ?????? ?????? ???????? ???? ?? ????? ????? ???? (???? ????? ?? ?????? ??????? ????????? ?????? ?? ???? ????? ?????): ??? ?????? ????? ?? ???? ??????? ??? ??????? ?? ?? ??? ?? ?????? ???????? ????????. ????  ?????? ????????? ?????? ?? ?????? ????????. ????? ??????? ?? ?????? ???????? ?????? ??????? ????????????????? (  FDA) ??? ????? ?.[phone]??? ??? ??? ???? ???????? ??????? ???? ?????? ?? ?????? ???????. ??? ??? ?????? ??? ?????? ?? ??????? ???? ?????? ??? ????? ??????. ???? ?? ?????? ??? ?????? ??? ????? ?????? ???????? ??????? ??? ?????? ?? ??????. ??????: ??? ??????? ????? ?? ????: ?? ?? ???? ??? ???????? ?? ????????? ???????. ??? ???? ???? ?? ????? ?? ??? ??????? ????? ??? ?????? ?? ??????? ?????? ??????? ??????.  2022 Elsevier/Gold Standard (2020-04-12 00:00:00)

## 2021-05-26 ENCOUNTER — Encounter: Payer: Self-pay | Admitting: *Deleted

## 2021-05-26 ENCOUNTER — Encounter: Payer: Self-pay | Admitting: Licensed Clinical Social Worker

## 2021-05-26 NOTE — Progress Notes (Signed)
[facility] CSW Progress Note  Clinical Education officer, museum  received TC from patient asking about any additional resources .  Patient has already been approved for/ received assistance from [place] [employer] [place], [place], [place], and [employer].  Discussed potential help with a utility bill from [employer] (pending funding). Pt also has one set of [employer] cards left which will be given at next appt. She is still waiting to hear back for a decision about disability benefits.   CSW sent message to S. Nyra Capes to determine if pt has any [employer] funds available.    Christeen Douglas , LCSW

## 2021-05-30 ENCOUNTER — Encounter: Payer: Self-pay | Admitting: *Deleted

## 2021-05-31 NOTE — Progress Notes (Signed)
..  The following Assist/Replace Program for Zarxio from [employer] has been terminated due to provider changed to long-acting medication, this one no longer active treatment orders.  Last DOS: 03/17/2021.

## 2021-06-05 ENCOUNTER — Encounter: Payer: Self-pay | Admitting: *Deleted

## 2021-06-09 ENCOUNTER — Encounter: Payer: Self-pay | Admitting: *Deleted

## 2021-06-09 ENCOUNTER — Other Ambulatory Visit: Payer: Self-pay

## 2021-06-09 ENCOUNTER — Ambulatory Visit (HOSPITAL_COMMUNITY): Payer: 59 | Attending: Cardiology

## 2021-06-09 DIAGNOSIS — C50311 Malignant neoplasm of lower-inner quadrant of right female breast: Secondary | ICD-10-CM | POA: Insufficient documentation

## 2021-06-09 DIAGNOSIS — Z170 Estrogen receptor positive status [ER+]: Secondary | ICD-10-CM | POA: Diagnosis not present

## 2021-06-09 DIAGNOSIS — Z0189 Encounter for other specified special examinations: Secondary | ICD-10-CM | POA: Diagnosis not present

## 2021-06-09 LAB — ECHOCARDIOGRAM COMPLETE
Area-P 1/2: 4.21 cm2
S' Lateral: 2.4 cm

## 2021-06-12 NOTE — Progress Notes (Addendum)
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 She is accompanied by an interpreter.  Rebecca [name] was switched to Doxorubicin, Cylophosphamide and Pembrolizumab on 03/22/2021 due to developing neuropathy--which however she now denies (there may have been a communication issue).  She receives [place] on day 3 (Neulasta biosimilar).  Today is her final of 4 planned cycles of this chemotherapy.  Her baseline echocardiogram on 01/31/2021 showed an ejection fraction of 60-65%.  Since her last visit, she underwent follow up breast MRI on 05/25/2021 showing: breast composition B; excellent response to chemotherapy with complete interval imaging resolution of patient's known right breast malignancy; marked interval improvement in size and enhancement of previously enlarged right axillary lymph node; no evidence of malignancy on left.  Her case was presented at the 05/31/2021 breast cancer multidisciplinary conference.  At that time Rebecca [name] felt breast conserving surgery with sentinel lymph node sampling was appropriate.  The patient will keep her port so that she can continue Keytruda  REVIEW OF SYSTEMS: Rebecca [name] has a variety of symptoms related to her recent chemotherapy.  Her skin is dry.  She has  pain at the base of the nails.  Of course her hair has not yet begun to grow.  She has an area of irritation on the skin in the upper left back.  She was anxious thinking she might get chemo today and she had a little bit of anticipatory nausea.  That really went away as soon as she knew she was only getting the Rebecca [name].  A detailed review of systems today was otherwise stable  COVID 19 VACCINATION STATUS: fully vaccinated [employer]), with booster 12/2020   HISTORY OF CURRENT ILLNESS: From the original intake note:  Rebecca [name] herself palpated a mass in the right axilla. She underwent bilateral diagnostic mammography with tomography and right breast ultrasonography at [facility] on 01/03/2021 showing: breast density category B; two adjacent suspicious right breast masses at 5 o'clock; markedly abnormal right axillary lymph node; no evidence of malignancy on left.  Accordingly on 01/06/2021 she proceeded to biopsy of the right breast area in question. The pathology from this procedure ([id number]) showed: invasive ductal carcinoma, grade 3. Prognostic indicators significant for: estrogen receptor, 10% positive with weak staining intensity and progesterone receptor, 0% negative. Proliferation marker Ki67 at 90%. HER2 equivocal by immunohistochemistry (2+), but negative by fluorescent in situ hybridization with a signals ratio 1.38 and number per cell 3.63.  Cancer Staging Malignant neoplasm of lower-inner quadrant of right breast of female, estrogen receptor positive ([place]) Staging form: Breast, AJCC 8th Edition - Clinical: [name] IIIB (cT2, cN1, cM0, G3, ER-, PR-, HER2-) - Signed by Rebecca Cruel, MD on 01/18/2021 [name] prefix: Initial diagnosis Histologic grading system: 3 grade system  [name] is as detailed below.   PAST MEDICAL HISTORY: Past Medical History:  Diagnosis Date   Arthritis  Breast cancer ([name])    Diabetes mellitus    Family history  of lymphoma 01/18/2021   Family history of throat cancer 01/18/2021   Hyperlipidemia    Hypertension     PAST SURGICAL HISTORY: Past Surgical History:  Procedure Laterality Date   CESAREAN SECTION     PORTACATH PLACEMENT N/A 01/26/2021   Procedure: INSERTION PORT-A-CATH;  Surgeon: Rebecca Luna, MD;  Location: [facility];  Service: General;  Laterality: N/A;  70    FAMILY HISTORY: Family History  Problem Relation Age of Onset   Diabetes Mother    Heart disease Mother    Diabetes Father    Throat cancer Father        d. 25s   Diabetes Sister    Diabetes Brother    Lymphoma Cousin        maternal cousin; d. 78s   Her father died in his 12's and her mother in her 06'Y, both from complications of diabetes. Rebecca [name] has 6 brothers and 1 sister. There is no family history of breast or ovarian cancer to her knowledge.   GYNECOLOGIC HISTORY:  No LMP recorded. Menarche: 55 years old Rebecca [name] 0 LMP 10/31/2020 (before that, 06/2020) Contraceptive used from 2017-2018 HRT n/a  Hysterectomy? no BSO? no   SOCIAL HISTORY: (updated 12/2020)  [name] is originally from [place]. 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 She is divorced. She lives at home with her nephew Rebecca [name], who is 9 and works for [employer]. She attends [employer]).    ADVANCED DIRECTIVES: not in place; at the 01/18/2021 visit she stated her ex-husband Rebecca [name], who is disabled due to past injuries, ([phone]) would be the person to contact if she became incapacitated [the patient states aside from her nephew she has no other relatives in [place]: Social History   Tobacco Use   Smoking status: Passive Smoke Exposure - Never Smoker   Smokeless tobacco: Never  Substance Use Topics   Alcohol use: No   Drug use: No     Colonoscopy: never done, referral in place  PAP: 12/2020, negative  Bone density: never  done   Allergies  Allergen Reactions   Lisinopril Swelling    Swelling in throat, coughing, difficulty swallowing, could not sleep. Occurred Dec 2020.    Current Outpatient Medications  Medication Sig Dispense Refill   amLODipine (NORVASC) 10 MG tablet TAKE 1 TABLET BY MOUTH EVERY DAY 90 tablet 0   Ascorbic Acid (VITAMIN C) 1000 MG tablet Take 1 tablet (1,000 mg total) by mouth daily. 30 tablet 0   atorvastatin (LIPITOR) 80 MG tablet TAKE 1 TABLET BY MOUTH EVERY DAY 90 tablet 1   Blood Glucose Monitoring Suppl (ONETOUCH VERIO REFLECT) w/Device KIT 1 kit by Does not apply route in the morning, at noon, and at bedtime. Use as directed to test blood sugar up to three times daily.E11.9 1 kit 0   cetirizine (ZYRTEC) 10 MG tablet Take 1 tablet (10 mg total) by mouth daily. (Patient not taking: Reported on 03/31/2021) 30 tablet 0   cloNIDine (CATAPRES) 0.1 MG tablet Take 1 tablet (0.1 mg total) by mouth at bedtime as needed. (Patient not taking: Reported on 03/31/2021) 90 tablet 1   clotrimazole (CLOTRIMAZOLE AF) 1 % cream Apply 1 application topically 2 (two) times daily. 30 g 1   cyclobenzaprine (FLEXERIL) 10 MG tablet Take 10 mg by mouth 3 (three) times  daily as needed for muscle spasms.     dexamethasone (DECADRON) 4 MG tablet Take 1 tablet (4 mg total) by mouth 2 (two) times daily. Take the day after chemo and the day after that, then stop 30 tablet 1   ergocalciferol (DRISDOL) 1.25 MG (50000 UT) capsule Take 1 capsule (50,000 Units total) by mouth once a week. 12 capsule 0   FARXIGA 10 MG TABS tablet TAKE 1 TABLET BY MOUTH EVERY MORNING WITH BREAKFAST 30 tablet 0   glipiZIDE (GLUCOTROL) 5 MG tablet TAKE 1 TABLET BY MOUTH DAILY 90 tablet 0   glucose blood (ONETOUCH VERIO) test strip Use as directed to test blood sugar up to three times daily. E11.9 100 each 6   Lancets (ONETOUCH DELICA PLUS [id number]) MISC 1 Device by Does not apply route in the morning, at noon, and at bedtime. Use as directed to  test blood sugar up to three times daily. E11.9 100 each 6   lidocaine-prilocaine (EMLA) cream APPLY TO AFFECTED AREA AS DIRECTED 30 g 3   LORazepam (ATIVAN) 0.5 MG tablet Take 1 tablet by mouth at bedtime. 30 tablet 0   metFORMIN (GLUCOPHAGE) 500 MG tablet TAKE 1 TABLET BY MOUTH TWICE DAILY 180 tablet 1   naproxen (NAPROSYN) 500 MG tablet Take 1 tablet (500 mg total) by mouth 2 (two) times daily with a meal. 30 tablet 1   Omega-3 1000 MG CAPS Take 1 capsule (1,000 mg total) by mouth daily. 30 capsule 0   omeprazole (PRILOSEC) 20 MG capsule Take 1 capsule (20 mg total) by mouth daily. 30 capsule 0   oxyCODONE (OXY IR/ROXICODONE) 5 MG immediate release tablet Take 1 tablet (5 mg total) by mouth every 6 (six) hours as needed for severe pain. 15 tablet 0   polyethylene glycol (MIRALAX / GLYCOLAX) 17 g packet TAKE 17 G BY MOUTH DAILY. 14 each 0   prochlorperazine (COMPAZINE) 10 MG tablet TAKE 1 TABLET BY MOUTH EVERY 6 HOURS AS NEEDED FOR NAUSEA OR VOMITING 30 tablet 1   valACYclovir (VALTREX) 500 MG tablet Take 1 tablet (500 mg total) by mouth daily. 30 tablet 0   vitamin B-12 (CYANOCOBALAMIN) 100 MCG tablet Take 1 tablet (100 mcg total) by mouth daily. 30 tablet 0   No current facility-administered medications for this visit.    OBJECTIVE: Rebecca [name] woman in no acute distress  Vitals:   06/13/21 0847  BP: 139/84  Pulse: (!) 102  Resp: 18  Temp: 97.9 F (36.6 C)  SpO2: 100%     Body mass index is 25.35 kg/m.   Wt Readings from Last 3 Encounters:  06/13/21 143 lb 1.6 oz (64.9 kg)  05/23/21 141 lb 11.2 oz (64.3 kg)  05/03/21 141 lb 6.4 oz (64.1 kg)      ECOG FS:1 - Symptomatic but completely ambulatory  Sclerae unicteric, EOMs intact Wearing a mask No cervical or supraclavicular adenopathy Lungs no rales or rhonchi Heart regular rate and rhythm Abd soft, nontender, positive bowel sounds MSK no focal spinal tenderness, no upper extremity lymphedema Neuro: nonfocal, well oriented,  appropriate affect Breasts: I do not palpate a mass in the right breast.  There are no skin or nipple changes of concern.  Both axillae are benign   LAB RESULTS:  CMP     Component Value Date/Time   NA 140 05/23/2021 0755   NA 140 12/27/2020 1123   K 3.9 05/23/2021 0755   CL 102 05/23/2021 0755   CO2 26 05/23/2021 0755  GLUCOSE 226 (H) 05/23/2021 0755   BUN 10 05/23/2021 0755   BUN 11 12/27/2020 1123   CREATININE 0.66 05/23/2021 0755   CREATININE 0.50 11/02/2016 1102   CALCIUM 10.1 05/23/2021 0755   PROT 7.4 05/23/2021 0755   PROT 7.5 12/27/2020 1123   ALBUMIN 4.1 05/23/2021 0755   ALBUMIN 4.7 12/27/2020 1123   AST 18 05/23/2021 0755   ALT 23 05/23/2021 0755   ALKPHOS 92 05/23/2021 0755   BILITOT 0.3 05/23/2021 0755   GFRNONAA >60 05/23/2021 0755   GFRNONAA >89 01/16/2016 1006   GFRAA 118 06/02/2020 0942   GFRAA >89 01/16/2016 1006    No results found for: TOTALPROTELP, ALBUMINELP, A1GS, A2GS, BETS, BETA2SER, GAMS, MSPIKE, SPEI  Lab Results  Component Value Date   WBC 5.1 06/13/2021   NEUTROABS 3.3 06/13/2021   HGB 10.9 (L) 06/13/2021   HCT 33.8 (L) 06/13/2021   MCV 88.7 06/13/2021   PLT 265 06/13/2021    No results found for: LABCA2  No components found for: [id number]  No results for input(s): INR in the last 168 hours.  No results found for: LABCA2  No results found for: SAY301  No results found for: SWF093  No results found for: ATF573  No results found for: CA2729  No components found for: HGQUANT  No results found for: CEA1 / No results found for: CEA1   No results found for: AFPTUMOR  No results found for: CHROMOGRNA  No results found for: KPAFRELGTCHN, LAMBDASER, KAPLAMBRATIO (kappa/lambda light chains)  No results found for: HGBA, HGBA2QUANT, HGBFQUANT, HGBSQUAN (Hemoglobinopathy evaluation)   No results found for: LDH  No results found for: IRON, TIBC, IRONPCTSAT (Iron and TIBC)  No results found for: FERRITIN  Urinalysis     Component Value Date/Time   COLORURINE YELLOW 06/24/2007 0116   APPEARANCEUR CLOUDY (A) 06/24/2007 0116   LABSPEC 1.025 09/18/2020 1125   PHURINE 5.5 09/18/2020 1125   GLUCOSEU 100 (A) 09/18/2020 1125   HGBUR TRACE (A) 09/18/2020 1125   BILIRUBINUR NEGATIVE 09/18/2020 1125   KETONESUR NEGATIVE 09/18/2020 1125   PROTEINUR NEGATIVE 09/18/2020 1125   UROBILINOGEN 0.2 09/18/2020 1125   NITRITE NEGATIVE 09/18/2020 1125   LEUKOCYTESUR NEGATIVE 09/18/2020 1125    STUDIES: MR BREAST BILATERAL W WO CONTRAST INC CAD  Result Date: 05/25/2021 CLINICAL DATA:  55 year old female status post neoadjuvant therapy for right breast cancer. LABS:  None performed on site. EXAM: BILATERAL BREAST MRI WITH AND WITHOUT CONTRAST TECHNIQUE: Multiplanar, multisequence MR images of both breasts were obtained prior to and following the intravenous administration of 7 ml of Gadavist. Three-dimensional MR images were rendered by post-processing of the original MR data on an independent workstation. The three-dimensional MR images were interpreted, and findings are reported in the following complete MRI report for this study. Three dimensional images were evaluated at the independent interpreting workstation using the DynaCAD thin client. COMPARISON:  Previous exam(s). FINDINGS: Breast composition: b. Scattered fibroglandular tissue. Background parenchymal enhancement: Minimal. Right breast: There has been complete interval resolution of the previously identified, adjacent enhancing masses in the lower right breast consistent with the patient's biopsy-proven malignancy. Susceptibility artifact from post biopsy marking clip remains in that location. No suspicious mass or abnormal enhancement in the remainder of the right breast. Left breast: No suspicious mass or abnormal enhancement. Lymph nodes: Marked interval improvement in size and enhancement of the previously enlarged right axillary lymph node. The lymph node now measures  approximately 1 cm in long axis dimension (previously will 2.8 cm).  No other suspicious axillary or internal mammary chain lymphadenopathy. Ancillary findings:  None. IMPRESSION: 1. Excellent response to chemotherapy with complete interval imaging resolution of the patient's known right breast malignancy. 2. Marked interval improvement in size and enhancement of the previously enlarged right axillary lymph node. 3. No MRI evidence of malignancy on the left. RECOMMENDATION: Per clinical treatment plan. BI-RADS CATEGORY  6: Known biopsy-proven malignancy. Electronically Signed   By: Kristopher Oppenheim M.D.   On: 05/25/2021 13:39  ECHOCARDIOGRAM COMPLETE  Result Date: 06/09/2021    ECHOCARDIOGRAM REPORT   Patient Name:   MELIDA NORTHINGTON Date of Exam: 06/09/2021 Medical Rec #:  [id number]            Height:       63.0 in Accession #:    [id number]           Weight:       141.7 lb Date of Birth:  07/26/1966            BSA:          1.670 m Patient Age:    23 years             BP:           125/79 mmHg Patient Gender: F                    HR:           101 bpm. Exam Location:  [facility] Procedure: 2D Echo and Strain Analysis Indications:    Chemo Z09  History:        Patient has prior history of Echocardiogram examinations, most                 recent 01/31/2021. Breast Cancer; Risk Factors:Hypertension,                 Dyslipidemia and Diabetes.  Sonographer:    Mikki Santee RDCS Referring Phys: [place]  1. Left ventricular ejection fraction, by estimation, is 60 to 65%. Left ventricular ejection fraction by PLAX is 65 %. The left ventricle has normal function. The left ventricle has no regional wall motion abnormalities. Left ventricular diastolic parameters are consistent with Grade I diastolic dysfunction (impaired relaxation). The average left ventricular global longitudinal strain is -22.0 %. The global longitudinal strain is normal.  2. Right ventricular systolic function is  normal. The right ventricular size is normal.  3. The mitral valve is abnormal. Trivial mitral valve regurgitation.  4. The aortic valve is tricuspid. Aortic valve regurgitation is trivial. No aortic stenosis is present.  5. The inferior vena cava is normal in size with greater than 50% respiratory variability, suggesting right atrial pressure of 3 mmHg.  6. There is a small patent foramen ovale with predominantly left to right shunting across the atrial septum. Comparison(s): No significant change from prior study. 01/31/2021: LVEF 60-65%. FINDINGS  Left Ventricle: Left ventricular ejection fraction, by estimation, is 60 to 65%. Left ventricular ejection fraction by PLAX is 65 %. The left ventricle has normal function. The left ventricle has no regional wall motion abnormalities. The average left ventricular global longitudinal strain is -22.0 %. The global longitudinal strain is normal. The left ventricular internal cavity size was normal in size. There is no left ventricular hypertrophy. Left ventricular diastolic parameters are consistent with  Grade I diastolic dysfunction (impaired relaxation). Indeterminate filling pressures. Right Ventricle: The right ventricular size is normal. No increase in  right ventricular wall thickness. Right ventricular systolic function is normal. Left Atrium: Left atrial size was normal in size. Right Atrium: Right atrial size was normal in size. Pericardium: There is no evidence of pericardial effusion. Mitral Valve: The mitral valve is abnormal. There is mild thickening of the anterior mitral valve leaflet(s). There is mild calcification of the anterior mitral valve leaflet(s). Trivial mitral valve regurgitation. Tricuspid Valve: The tricuspid valve is grossly normal. Tricuspid valve regurgitation is trivial. Aortic Valve: The aortic valve is tricuspid. Aortic valve regurgitation is trivial. No aortic stenosis is present. Pulmonic Valve: The pulmonic valve was normal in structure.  Pulmonic valve regurgitation is not visualized. Aorta: The aortic root and ascending aorta are structurally normal, with no evidence of dilitation. Venous: The inferior vena cava is normal in size with greater than 50% respiratory variability, suggesting right atrial pressure of 3 mmHg. IAS/Shunts: The interatrial septum is aneurysmal. No atrial level shunt detected by color flow Doppler. A small patent foramen ovale is detected with predominantly left to right shunting across the atrial septum.  LEFT VENTRICLE PLAX 2D LV EF:         Left            Diastology                ventricular     LV e' medial:    6.96 cm/s                ejection        LV E/e' medial:  8.9                fraction by     LV e' lateral:   12.90 cm/s                PLAX is 65      LV E/e' lateral: 4.8                %. LVIDd:         3.70 cm         2D LVIDs:         2.40 cm         Longitudinal LV PW:         0.95 cm         Strain LV IVS:        0.85 cm         2D Strain GLS  -19.7 % LVOT diam:     2.00 cm         (A2C): LV SV:         51              2D Strain GLS  -22.0 % LV SV Index:   31              (A3C): LVOT Area:     3.14 cm        2D Strain GLS  -24.3 %                                (A4C):                                2D Strain GLS  -22.0 %  Avg: RIGHT VENTRICLE RV S prime:     12.40 cm/s TAPSE (M-mode): 1.2 cm LEFT ATRIUM             Index       RIGHT ATRIUM           Index LA diam:        2.95 cm 1.77 cm/m  RA Area:     11.00 cm LA Vol (A2C):   35.5 ml 21.25 ml/m RA Volume:   22.80 ml  13.65 ml/m LA Vol (A4C):   49.4 ml 29.58 ml/m LA Biplane Vol: 42.8 ml 25.63 ml/m  AORTIC VALVE LVOT Vmax:   99.20 cm/s LVOT Vmean:  66.100 cm/s LVOT VTI:    0.163 m  AORTA Ao Root diam: 2.50 cm MITRAL VALVE MV Area (PHT): 4.21 cm    SHUNTS MV Decel Time: 180 msec    Systemic VTI:  0.16 m MV E velocity: 61.60 cm/s  Systemic Diam: 2.00 cm MV A velocity: 76.20 cm/s MV E/A ratio:  0.81 Lyman Bishop MD  Electronically signed by Lyman Bishop MD Signature Date/Time: 06/09/2021/2:56:02 PM    Final      ELIGIBLE FOR AVAILABLE RESEARCH PROTOCOL: no  ASSESSMENT: 55 y.o. [place] woman status post right breast lower inner quadrant biopsy 01/06/2021 for a clinically T2 N1, [name] IIIB functionally triple negative invasive ductal carcinoma, grade 3, with and MIB-1 of 90%  (a) chest CT scan 01/29/2021 shows no evidence of metastatic disease  (b) bone scan 02/01/2021 shows no evidence of metastatic disease  (1) neoadjuvant chemotherapy consisting of pembrolizumab given day 1 together with carboplatin and paclitaxel, with Taxol given on day 8 and 15, and filgrastim on days 16,17 and 18, started 02/07/2021, repeated every 21 days x 2, followed by doxorubicin and cyclophosphamide every 21 days x 4 also given with pembrolizumab on day 1 and PEG filgrastim day 3, started 03/22/2021 and completed 05/23/2021  (a) carboplatin/paclitaxel discontinued after the cycle 2-day 8 dose with neuropathy developing (subsequently denied by patient)   (2) pembrolizumab continuing to total 1 year  (3) definitive surgery to follow  (4) adjuvant radiation  (5) genetics testing 01/31/2021 through the [place] [facility] [employer] Panel found no deleterious mutations in AIP, ALK, APC, ATM, AXIN2, BAP1, BARD1, BLM, BMPR1A, BRCA1, BRCA2, BRIP1, CDC73, CDH1, CDK4, CDKN1B, CDKN2A, CHEK2, CTNNA1, DICER1, FANCC, FH, FLCN, GALNT12, KIF1B, LZTR1, MAX, MEN1, MET, MLH1, MSH2, MSH3, MSH6, MUTYH, NBN, NF1, NF2, NTHL1, PALB2, PHOX2B, PMS2, POT1, PRKAR1A, PTCH1, PTEN, RAD51C, RAD51D, RB1, RECQL, RET, SDHA, SDHAF2, SDHB, SDHC, SDHD, SMAD4, SMARCA4, SMARCB1, SMARCE1, STK11, SUFU, TMEM127, TP53, TSC1, TSC2, VHL and XRCC2 (sequencing and deletion/duplication); EGFR, EGLN1, HOXB13, KIT, MITF, PDGFRA, POLD1, and POLE (sequencing only); EPCAM and GREM1 (deletion/duplication only).   PLAN: Kalaysia did very well with her chemotherapy.  She does  need to continue the [facility] and she will receive a dose today.  She understands this is not chemotherapy but something that will help her immune system to fight the cancer.  She has a variety of symptoms related to her earlier chemo.  I reassured her that within the next 3 months her hair will be growing her nails will be healing, her skin will not be so dry and itchy and generally she will feel better and have more energy.  She will see Rebecca [name] later this month and she is scheduled for surgery she tells me June 29, 2021.  Accordingly her next treatment will not be the 13th about 20 September.  I will see her that day.  Depending on her surgical results and how she is doing I may refer her to radiation at that time  I showed her the images of her MRI before and after treatment.  She took photos.  She was very encouraged.  Total encounter time 25 minutes.Sarajane Jews C. Mycah Mcdougall, MD 06/13/21 9:04 AM Medical Oncology and Hematology [facility] [place], [place] [address] Tel. [phone]    Fax. [phone]   I, Wilburn Mylar, am acting as scribe for Dr. Virgie [name]. Caniyah Murley.  I, Lurline Del MD, have reviewed the above documentation for accuracy and completeness, and I agree with the above.   *Total Encounter Time as defined by the Centers for Medicare and Medicaid Services includes, in addition to the face-to-face time of a patient visit (documented in the note above) non-face-to-face time: obtaining and reviewing outside history, ordering and reviewing medications, tests or procedures, care coordination (communications with other health care professionals or caregivers) and documentation in the medical record.

## 2021-06-13 ENCOUNTER — Inpatient Hospital Stay: Payer: 59

## 2021-06-13 ENCOUNTER — Inpatient Hospital Stay: Payer: 59 | Admitting: Dietician

## 2021-06-13 ENCOUNTER — Inpatient Hospital Stay (HOSPITAL_BASED_OUTPATIENT_CLINIC_OR_DEPARTMENT_OTHER): Payer: 59 | Admitting: Oncology

## 2021-06-13 ENCOUNTER — Other Ambulatory Visit: Payer: Self-pay

## 2021-06-13 VITALS — HR 94

## 2021-06-13 VITALS — BP 139/84 | HR 102 | Temp 97.9°F | Resp 18 | Ht 63.0 in | Wt 143.1 lb

## 2021-06-13 DIAGNOSIS — Z95828 Presence of other vascular implants and grafts: Secondary | ICD-10-CM

## 2021-06-13 DIAGNOSIS — C50311 Malignant neoplasm of lower-inner quadrant of right female breast: Secondary | ICD-10-CM | POA: Diagnosis not present

## 2021-06-13 DIAGNOSIS — Z5112 Encounter for antineoplastic immunotherapy: Secondary | ICD-10-CM | POA: Diagnosis not present

## 2021-06-13 DIAGNOSIS — Z170 Estrogen receptor positive status [ER+]: Secondary | ICD-10-CM

## 2021-06-13 LAB — CBC WITH DIFFERENTIAL (CANCER CENTER ONLY)
Abs Immature Granulocytes: 0.05 10*3/uL (ref 0.00–0.07)
Basophils Absolute: 0 10*3/uL (ref 0.0–0.1)
Basophils Relative: 1 %
Eosinophils Absolute: 0.1 10*3/uL (ref 0.0–0.5)
Eosinophils Relative: 1 %
HCT: 33.8 % — ABNORMAL LOW (ref 36.0–46.0)
Hemoglobin: 10.9 g/dL — ABNORMAL LOW (ref 12.0–15.0)
Immature Granulocytes: 1 %
Lymphocytes Relative: 19 %
Lymphs Abs: 1 10*3/uL (ref 0.7–4.0)
MCH: 28.6 pg (ref 26.0–34.0)
MCHC: 32.2 g/dL (ref 30.0–36.0)
MCV: 88.7 fL (ref 80.0–100.0)
Monocytes Absolute: 0.8 10*3/uL (ref 0.1–1.0)
Monocytes Relative: 15 %
Neutro Abs: 3.3 10*3/uL (ref 1.7–7.7)
Neutrophils Relative %: 63 %
Platelet Count: 265 10*3/uL (ref 150–400)
RBC: 3.81 MIL/uL — ABNORMAL LOW (ref 3.87–5.11)
RDW: 16.9 % — ABNORMAL HIGH (ref 11.5–15.5)
WBC Count: 5.1 10*3/uL (ref 4.0–10.5)
nRBC: 0 % (ref 0.0–0.2)

## 2021-06-13 LAB — CMP (CANCER CENTER ONLY)
ALT: 27 U/L (ref 0–44)
AST: 23 U/L (ref 15–41)
Albumin: 4 g/dL (ref 3.5–5.0)
Alkaline Phosphatase: 84 U/L (ref 38–126)
Anion gap: 14 (ref 5–15)
BUN: 10 mg/dL (ref 6–20)
CO2: 21 mmol/L — ABNORMAL LOW (ref 22–32)
Calcium: 9.3 mg/dL (ref 8.9–10.3)
Chloride: 104 mmol/L (ref 98–111)
Creatinine: 0.66 mg/dL (ref 0.44–1.00)
GFR, Estimated: >60 mL/min (ref >60)
Glucose, Bld: 246 mg/dL — ABNORMAL HIGH (ref 70–99)
Potassium: 3.9 mmol/L (ref 3.5–5.1)
Sodium: 139 mmol/L (ref 135–145)
Total Bilirubin: 0.3 mg/dL (ref 0.3–1.2)
Total Protein: 7.1 g/dL (ref 6.5–8.1)

## 2021-06-13 LAB — TSH: TSH: 1.204 u[IU]/mL (ref 0.308–3.960)

## 2021-06-13 MED ORDER — HEPARIN SOD (PORK) LOCK FLUSH 100 UNIT/ML IV SOLN
500.0000 [IU] | Freq: Once | INTRAVENOUS | Status: AC | PRN
  Administered 2021-06-13: 500 [IU]

## 2021-06-13 MED ORDER — VALACYCLOVIR HCL 500 MG PO TABS: 500.0000 mg | ORAL_TABLET | Freq: Every day | ORAL | 0 refills | Status: DC

## 2021-06-13 MED ORDER — SODIUM CHLORIDE 0.9 % IV SOLN
200.0000 mg | Freq: Once | INTRAVENOUS | Status: AC
  Administered 2021-06-13: 200 mg via INTRAVENOUS
  Filled 2021-06-13: qty 8

## 2021-06-13 MED ORDER — SODIUM CHLORIDE 0.9 % IV SOLN: Freq: Once | INTRAVENOUS | Status: AC

## 2021-06-13 MED ORDER — ONDANSETRON HCL 4 MG/2ML IJ SOLN
8.0000 mg | Freq: Once | INTRAMUSCULAR | Status: AC
  Administered 2021-06-13: 8 mg via INTRAVENOUS
  Filled 2021-06-13: qty 4

## 2021-06-13 MED ORDER — SODIUM CHLORIDE 0.9 % IV SOLN: 8.0000 mg | Freq: Once | INTRAVENOUS | Status: DC

## 2021-06-13 MED ORDER — ONDANSETRON HCL 4 MG/2ML IJ SOLN: 8.0000 mg | Freq: Once | INTRAMUSCULAR | Status: DC

## 2021-06-13 MED ORDER — SODIUM CHLORIDE 0.9% FLUSH
10.0000 mL | Freq: Once | INTRAVENOUS | Status: AC
  Administered 2021-06-13: 10 mL

## 2021-06-13 MED ORDER — SODIUM CHLORIDE 0.9% FLUSH
10.0000 mL | INTRAVENOUS | Status: DC | PRN
  Administered 2021-06-13: 10 mL

## 2021-06-13 NOTE — Progress Notes (Signed)
Nutrition  Patient completed treatment early today. Unable to see patient in infusion for nutrition follow-up. Per chart, patient denies nutrition impact symptoms. Weight 143 lb 1.6 oz today increased from 141 lb 1.6 oz on 7/8 and 138 lb 4.8 oz on 6/10. Patient is scheduled for surgery September 8. Patient has been for nutrition follow-up on September 20 during infusion.

## 2021-06-13 NOTE — Patient Instructions (Signed)
[facility] ONCOLOGY  Discharge Instructions: Thank you for choosing [facility] to provide your oncology and hematology care.   If you have a lab appointment with the [facility], please go directly to the [facility] and check in at the registration area.   Wear comfortable clothing and clothing appropriate for easy access to any Portacath or PICC line.   We strive to give you quality time with your provider. You may need to reschedule your appointment if you arrive late (15 or more minutes).  Arriving late affects you and other patients whose appointments are after yours.  Also, if you miss three or more appointments without notifying the office, you may be dismissed from the clinic at the provider's discretion.      For prescription refill requests, have your pharmacy contact our office and allow 72 hours for refills to be completed.    Today you received the following chemotherapy and/or immunotherapy agents Keytruda       To help prevent nausea and vomiting after your treatment, we encourage you to take your nausea medication as directed.  BELOW ARE SYMPTOMS THAT SHOULD BE REPORTED IMMEDIATELY: *FEVER GREATER THAN 100.4 F (38 C) OR HIGHER *CHILLS OR SWEATING *NAUSEA AND VOMITING THAT IS NOT CONTROLLED WITH YOUR NAUSEA MEDICATION *UNUSUAL SHORTNESS OF BREATH *UNUSUAL BRUISING OR BLEEDING *URINARY PROBLEMS (pain or burning when urinating, or frequent urination) *BOWEL PROBLEMS (unusual diarrhea, constipation, pain near the anus) TENDERNESS IN MOUTH AND THROAT WITH OR WITHOUT PRESENCE OF ULCERS (sore throat, sores in mouth, or a toothache) UNUSUAL RASH, SWELLING OR PAIN  UNUSUAL VAGINAL DISCHARGE OR ITCHING   Items with * indicate a potential emergency and should be followed up as soon as possible or go to the Emergency Department if any problems should occur.  Please show the CHEMOTHERAPY ALERT CARD or IMMUNOTHERAPY ALERT CARD at check-in to  the Emergency Department and triage nurse.  Should you have questions after your visit or need to cancel or reschedule your appointment, please contact [facility]  Dept: [phone]  and follow the prompts.  Office hours are 8:00 a.m. to 4:30 p.m. Monday - Friday. Please note that voicemails left after 4:00 p.m. may not be returned until the following business day.  We are closed weekends and major holidays. You have access to a nurse at all times for urgent questions. Please call the main number to the clinic Dept: [phone] and follow the prompts.   For any non-urgent questions, you may also contact your provider using MyChart. We now offer e-Visits for anyone 67 and older to request care online for non-urgent symptoms. For details visit [email or web address].   Also download the MyChart app! Go to the app store, search "MyChart", open the app, select [facility], and log in with your MyChart username and password.  Due to Covid, a mask is required upon entering the hospital/clinic. If you do not have a mask, one will be given to you upon arrival. For doctor visits, patients may have 1 support person aged 4 or older with them. For treatment visits, patients cannot have anyone with them due to current Covid guidelines and our immunocompromised population.    Pembrolizumab injection ?? ??? ??????? ????????????? ????? ?? ??? ???? ????? ???????. ?????? ????? ????? ????? ?????????. ???? ??????? ??? ?????? ?????? ????? ???? ???? ??????? ?????? ?? ??????? ??????? ???? ?????. ????? ???????? ???????? ????????:? Keytruda ?? ?? ??????? ???? ??? ?? ???? ??? ???? ??????? ?????? ??? ????? ??? ??????? ?? ????? ??? ????? ?? ??? ??? ?????? ??? ?? ??? ??????? ????: ????? ??????? ???????? ??? ??? ???? ?? ?????? ??????? ??????? ?? ?????? ??? ???? ?? ???? ?????? ??? ??????? ??????? ??????? (?????? ??????? ??????? ?????? ???? ???) ????? ????? ??????? ????? ?? ??????? ???  ????? ????? ?????? ??????? ??? ????? ?????? ?????? ?? ??????? ????? ????? ?? ??? ??? ???? ?? ?????? ?? ????????????? ?? ??? ??? ???? ?? ?????? ?? ????? ???? ?? ??? ??? ???? ?? ?????? ?? ??????? ?? ??????? ?? ?????? ??????? ???? ?? ????? ????? ??????? ???????? ??? ??? ???? ??????? ??? ??????? ??? ?????? ???? ??????? ???? ????. ??? ????? ??? ?????? ?? ???? ?????? ????????? ?? ?????? ?? ?????. ????? ?? ???? ??? ??? ??? ??? ?? ????. ???? ?? ????? ??? ????????? ?????? ?? ?????. ???? ??? ???? ??????? ???? ??????? ??? ?????? ???????. ?? ??? ?? ??? ?????? ???? ?? ???? ??????? ????? ?????? ?? ?????  6 ???? ????? ????? ????? ?????? ?????? ???? ????? ?????????? ???????. ?????? ??????? : ??? ?????? ??? ?????? ???? ????? ???? ?? ??? ??????? ????????? ?????? ?? ?????? ?? ???? ??????? ?????. ??????: ?????? ??? ?????? ?? ???? ??? ???. ?? ????? ????? ??? ?? ????? ?????????. ???? ?? ???? (????) ????? ?? ????? ??? ???? ????. ???? ?????? ?? ?????? ??????? ?????? ??? ??? ??? ??????? ?????? ??? ????. ?? ?? ??????? ???? ?? ?????? ?? ??? ??????? ?? ???? ?????????. ??? ??????? ?? ?? ??? ?? ????????? ????????. ?? ?????? ???? ??????? ?????? ????? ??? ??????? ?? ??????? ?? ??????? ???? ???? ?? ???????? ???????? ???? ????????. ?????? ????? ??? ??? ???? ?? ???? ?????? ?? ?????? ?????? ??????????. ?? ?????? ??? ??????? ?? ?????. ?? ???? ??? ???? ????? ??? ??????? ??? ??????? ??? ??? ??????? ?? ??? ????? ???? ????? ??????. ??? ????? ??? ????? ???????? ?????? ???? ????? ?????? ???? ??????. ?? ?????? ????? ????? ????? ??? ?????? ?? ???? 4 ???? ??? ?????? ?? ??? ??????. ???? ??? ?????? ????? ??????? ??? ?? ????? ?? ????? ?? ?????? ??????. ???? ?????? ???? ???? ?????? ????? ??? ??????. ????? ??? ????? ?? ?????? ??????? ?????? ?? ??????? ?????? ??? ???? ?? ?????????. ?? ????? ???? ????? ????? ????????? ?? ???? 4 ???? ??? ??? ???? ???. ?? ?? ?????? ???????? ???? ???? ?? ??????? ??? ???? ??? ??????? ?????? ???????? ???? ??? ?? ????  ????? ?? ?????? ??????? ?????? ??? ?? ???? ???????: ???????? ??? ????? ?????? ?????? ??????????? (?????) ???? ????? ?? ?????? ?? ??????. ???? ???? ?? ???? ?? ???? ??? ??????? ????? ?????? ?????? ?? ?????? ??? ????? ??????? ???????? ????? ???? ????? ?????? ?? ?????? ?????? ?? ?????? ????? ????? ??????? ?? ??? ???????? ?????? ?????? ????? ????? ?? ???? ??????? ??? ??? ?????? ???? - ??? ?????? ?? ???? ?? ??? ??????? ??????? ?????? ???? ??????? ???????? ???????. ?? ???? ?????? ???? ?????? ?????? ???????. ????? ?? ???? ??????? ??? ??????? ??? ?? ????? ?? ??? ?? ?????? ?? ??????? ???? ?????? ?????? ?? ????? ?? ???? ?? ???? ?? ?????? ??? ?? ??? ????? ???? ???? ?????? ?????? ?????? ??? ???? ??? ??????? ???? ????? ???? ?????? ????? ??? ????? ????????? ???????? ???? ?????? ????? ?? ????? ????????? ?????? ?????? ?????? ?????? ????? ?????? ??? ????? ?????? ?? ?????? ?? ???? ????? ?????? ?????? ????? ?????? ??? ????? ?? ????? ?????? ?????? ?? ?????? ?????? ?? ????? ??????? ??? ??????? ???????? ??? ?? ????? ?????? ?????? ?? ?????? ?????? ??????? ?? ????? ??? ??? ????? ????????? ????? ????? ?????? ???????? ???? ?? ????? ????? ???? (???? ????? ?? ?????? ??????? ????????? ?????? ?? ???? ????? ?????): ????? ?????? ????? ????? ?????? ?????? ??? ??????? ?? ?? ??? ?? ?????? ???????? ????????. ???? ?????? ????????? ?????? ?? ?????? ????????. ????? ??????? ?? ?????? ???????? ?????? ??????? ????????????????? (FDA) ??? ????? [phone]??? ??? ??? ???? ???????? ??????? ??? ????? ??? ?????? ?? ???? ?????? ????? ???? ?? ?????? ?? ?????. ?? ????????? ??? ?????? ??????? ?? ??????. ??????: ??? ??????? ????? ?? ????: ?? ?? ???? ??? ???????? ?? ????????? ???????. ??? ???? ???? ?? ????? ?? ??? ??????? ????? ??? ?????? ?? ??????? ?????? ??????? ??????.  2022 Elsevier/Gold Standard (2020-02-11 00:00:00)

## 2021-06-15 ENCOUNTER — Encounter: Payer: Self-pay | Admitting: Licensed Clinical Social Worker

## 2021-06-15 ENCOUNTER — Ambulatory Visit: Payer: 59

## 2021-06-15 NOTE — Progress Notes (Signed)
[facility] CSW Progress Note  [facility] representative received medical bills that pt dropped off with financial advocate. CSW faxed to [employer] in [place] for assistance paying bills directly.  Mailed originals back to patient with contact information for Vicente Males with [employer] in [place] if she has questions about the assistance process.    Christeen Douglas , LCSW

## 2021-06-19 ENCOUNTER — Ambulatory Visit: Payer: Self-pay | Admitting: Surgery

## 2021-06-19 ENCOUNTER — Other Ambulatory Visit: Payer: Self-pay | Admitting: Family Medicine

## 2021-06-19 DIAGNOSIS — C50911 Malignant neoplasm of unspecified site of right female breast: Secondary | ICD-10-CM

## 2021-06-19 DIAGNOSIS — C779 Secondary and unspecified malignant neoplasm of lymph node, unspecified: Secondary | ICD-10-CM

## 2021-06-19 NOTE — Telephone Encounter (Signed)
Requested medication (s) are due for refill today: Yes  Requested medication (s) are on the active medication list: Yes  Last refill:  05/19/21  Future visit scheduled: No  Notes to clinic:  Unable to refill per protocol due to failed labs, no updated results (A1C last 10/2017)     Requested Prescriptions  Pending Prescriptions Disp Refills   FARXIGA 10 MG TABS tablet [Pharmacy Med Name: FARXIGA 10MG TABLETS] 30 tablet 0    Sig: TAKE 1 TABLET BY [facility]     Endocrinology:  Diabetes - SGLT2 Inhibitors Failed - 06/19/2021  4:57 AM      Failed - HBA1C is between 0 and 7.9 and within 180 days    HbA1c, POC (controlled diabetic range)  Date Value Ref Range Status  11/04/2019 7.9 (A) 0.0 - 7.0 % Final   Hgb A1c MFr Bld  Date Value Ref Range Status  11/11/2020 7.1 (H) 4.8 - 5.6 % Final    Comment:             Prediabetes: 5.7 - 6.4          Diabetes: >6.4          Glycemic control for adults with diabetes: <7.0           Passed - Cr in normal range and within 360 days    Creatinine  Date Value Ref Range Status  06/13/2021 0.66 0.44 - 1.00 mg/dL Final   Creat  Date Value Ref Range Status  11/02/2016 0.50 0.50 - 1.10 mg/dL Final   Creatinine, Urine  Date Value Ref Range Status  11/02/2016 141 20 - 320 mg/dL Final          Passed - LDL in normal range and within 360 days    LDL Chol Calc (NIH)  Date Value Ref Range Status  11/11/2020 41 0 - 99 mg/dL Final          Passed - eGFR in normal range and within 360 days    GFR, Est African American  Date Value Ref Range Status  01/16/2016 >89 >=60 mL/min Final   GFR calc Af Amer  Date Value Ref Range Status  06/02/2020 118 >59 mL/min/1.73 Final    Comment:    **[facility] currently reports eGFR in compliance with the current**   recommendations of the [employer]. [facility] will   update reporting as new guidelines are published from the NKF-ASN   Task force.    GFR, Est Non  African American  Date Value Ref Range Status  01/16/2016 >89 >=60 mL/min Final    Comment:      The estimated GFR is a calculation valid for adults (>=33 years old) that uses the CKD-EPI algorithm to adjust for age and sex. It is   not to be used for children, pregnant women, hospitalized patients,    patients on dialysis, or with rapidly changing kidney function. According to the NKDEP, eGFR >89 is normal, 60-89 shows mild impairment, 30-59 shows moderate impairment, 15-29 shows severe impairment and <15 is ESRD.      GFR, Estimated  Date Value Ref Range Status  06/13/2021 >60 >60 mL/min Final    Comment:    (NOTE) Calculated using the CKD-EPI Creatinine Equation (2021)    eGFR  Date Value Ref Range Status  12/27/2020 111 >59 mL/min/1.73 Final          Passed - Valid encounter within last 6 months    Recent Outpatient  Visits           5 months ago Annual physical exam   [facility], [place], MD   7 months ago Vasomotor symptoms due to menopause   [facility], [name], MD   12 months ago Type 2 diabetes mellitus without complication, without long-term current use of insulin [facility])   [place], Jarome Matin, RPH-CPP   1 year ago Type 2 diabetes mellitus without complication, without long-term current use of insulin [facility])   [place], Stephen L, RPH-CPP   1 year ago Myalgia   [facility] [name], MD

## 2021-06-20 ENCOUNTER — Other Ambulatory Visit: Payer: Self-pay | Admitting: Surgery

## 2021-06-20 ENCOUNTER — Encounter: Payer: Self-pay | Admitting: *Deleted

## 2021-06-20 DIAGNOSIS — C50911 Malignant neoplasm of unspecified site of right female breast: Secondary | ICD-10-CM

## 2021-06-29 ENCOUNTER — Other Ambulatory Visit: Payer: Self-pay

## 2021-06-29 ENCOUNTER — Ambulatory Visit: Payer: 59 | Attending: Family Medicine | Admitting: Family Medicine

## 2021-06-29 VITALS — BP 120/79 | HR 91 | Ht 63.0 in | Wt 145.4 lb

## 2021-06-29 DIAGNOSIS — G620 Drug-induced polyneuropathy: Secondary | ICD-10-CM

## 2021-06-29 DIAGNOSIS — C50311 Malignant neoplasm of lower-inner quadrant of right female breast: Secondary | ICD-10-CM

## 2021-06-29 DIAGNOSIS — L299 Pruritus, unspecified: Secondary | ICD-10-CM | POA: Diagnosis not present

## 2021-06-29 DIAGNOSIS — T451X5A Adverse effect of antineoplastic and immunosuppressive drugs, initial encounter: Secondary | ICD-10-CM

## 2021-06-29 DIAGNOSIS — Z170 Estrogen receptor positive status [ER+]: Secondary | ICD-10-CM

## 2021-06-29 DIAGNOSIS — E119 Type 2 diabetes mellitus without complications: Secondary | ICD-10-CM

## 2021-06-29 LAB — POCT GLYCOSYLATED HEMOGLOBIN (HGB A1C): HbA1c, POC (controlled diabetic range): 7.5 % — AB (ref 0.0–7.0)

## 2021-06-29 LAB — GLUCOSE, POCT (MANUAL RESULT ENTRY): POC Glucose: 173 mg/dl — AB (ref 70–99)

## 2021-06-29 MED ORDER — HYDROXYZINE HCL 25 MG PO TABS: 25.0000 mg | ORAL_TABLET | Freq: Three times a day (TID) | ORAL | 1 refills | Status: DC | PRN

## 2021-06-29 MED ORDER — METFORMIN HCL 500 MG PO TABS: 1000.0000 mg | ORAL_TABLET | Freq: Two times a day (BID) | ORAL | 1 refills | Status: DC

## 2021-06-29 MED ORDER — GABAPENTIN 300 MG PO CAPS: 300.0000 mg | ORAL_CAPSULE | Freq: Two times a day (BID) | ORAL | 3 refills | Status: DC

## 2021-06-29 MED ORDER — DAPAGLIFLOZIN PROPANEDIOL 10 MG PO TABS: 10.0000 mg | ORAL_TABLET | Freq: Every day | ORAL | 1 refills | Status: DC

## 2021-06-29 NOTE — Progress Notes (Signed)
Subjective:  Patient ID: Rebecca [name], female    DOB: [birth year]  Age: 55 y.o. MRN: [id number]  CC: Diabetes   HPI Rebecca [name] is a 55 y.o. year old female with a history of type 2 diabetes mellitus (A1c 7.1), hyperlipidemia, hypertension, stage IIIB R breast ca ( cT2, CN1, cM0 triple neg ckinically but 10%ER+, completed chemo)  Interval History: The air condition in her apartment is broken and since she is undergoing chemo she gets hot a lot. Also complains of hypersensitivity of her skin. She cannot bear to have her clothes touch her skin She has a rash and her body itches.  She has been applying ice all to no avail.  She is requesting a letter to her apartment so they can fix her broken AC.  Seen by General Surgery on 06/19/2021 to discuss breast conservation surgery.  Notes reviewed and revealed excellent response to chemotherapy with interval imaging resolution of right breast malignancy.  Plan is to proceed with right breast lumpectomy, right sentinel lymph node mapping and targeted right lymph node biopsy.  Her A1c is 7.5, up from 7.1 and she endorses compliance with metformin at 1000 mg twice daily (even though her med list reveals 500 mg twice daily), glipizide 5 mg twice daily and [place].  She states she has been ingesting a lot of carbs and not paying attention to her diet asked her nutritionist had advised her she needed to boost her immunity. She is also on a statin.  Doing well on amlodipine for her hypertension. Past Medical History:  Diagnosis Date   Arthritis    Breast cancer ([facility])    Diabetes mellitus    Family history of lymphoma 01/18/2021   Family history of throat cancer 01/18/2021   Hyperlipidemia    Hypertension     Past Surgical History:  Procedure Laterality Date   CESAREAN SECTION     PORTACATH PLACEMENT N/A 01/26/2021   Procedure: INSERTION PORT-A-CATH;  Surgeon: Erroll Luna, MD;  Location: [facility];  Service: General;  Laterality: N/A;  76     Family History  Problem Relation Age of Onset   Diabetes Mother    Heart disease Mother    Diabetes Father    Throat cancer Father        d. 59s   Diabetes Sister    Diabetes Brother    Lymphoma Cousin        maternal cousin; d. 16s    Allergies  Allergen Reactions   Lisinopril Swelling    Swelling in throat, coughing, difficulty swallowing, could not sleep. Occurred Dec 2020.    Outpatient Medications Prior to Visit  Medication Sig Dispense Refill   amLODipine (NORVASC) 10 MG tablet TAKE 1 TABLET BY MOUTH EVERY DAY 90 tablet 0   Ascorbic Acid (VITAMIN C) 1000 MG tablet Take 1 tablet (1,000 mg total) by mouth daily. 30 tablet 0   atorvastatin (LIPITOR) 80 MG tablet TAKE 1 TABLET BY MOUTH EVERY DAY 90 tablet 1   Blood Glucose Monitoring Suppl (ONETOUCH VERIO REFLECT) w/Device KIT 1 kit by Does not apply route in the morning, at noon, and at bedtime. Use as directed to test blood sugar up to three times daily.E11.9 1 kit 0   cetirizine (ZYRTEC) 10 MG tablet Take 1 tablet (10 mg total) by mouth daily. 30 tablet 0   cloNIDine (CATAPRES) 0.1 MG tablet Take 1 tablet (0.1 mg total) by mouth at bedtime as needed. 90 tablet 1   clotrimazole (  CLOTRIMAZOLE AF) 1 % cream Apply 1 application topically 2 (two) times daily. 30 g 1   cyclobenzaprine (FLEXERIL) 10 MG tablet Take 10 mg by mouth 3 (three) times daily as needed for muscle spasms.     ergocalciferol (DRISDOL) 1.25 MG (50000 UT) capsule Take 1 capsule (50,000 Units total) by mouth once a week. 12 capsule 0   FARXIGA 10 MG TABS tablet TAKE 1 TABLET BY MOUTH EVERY MORNING WITH BREAKFAST 30 tablet 0   glipiZIDE (GLUCOTROL) 5 MG tablet TAKE 1 TABLET BY MOUTH DAILY 90 tablet 0   glucose blood (ONETOUCH VERIO) test strip Use as directed to test blood sugar up to three times daily. E11.9 100 each 6   Lancets (ONETOUCH DELICA PLUS [id number]) MISC 1 Device by Does not apply route in the morning, at noon, and at bedtime. Use as directed to  test blood sugar up to three times daily. E11.9 100 each 6   lidocaine-prilocaine (EMLA) cream APPLY TO AFFECTED AREA AS DIRECTED 30 g 3   metFORMIN (GLUCOPHAGE) 500 MG tablet TAKE 1 TABLET BY MOUTH TWICE DAILY 180 tablet 1   naproxen (NAPROSYN) 500 MG tablet Take 1 tablet (500 mg total) by mouth 2 (two) times daily with a meal. 30 tablet 1   Omega-3 1000 MG CAPS Take 1 capsule (1,000 mg total) by mouth daily. 30 capsule 0   omeprazole (PRILOSEC) 20 MG capsule Take 1 capsule (20 mg total) by mouth daily. 30 capsule 0   polyethylene glycol (MIRALAX / GLYCOLAX) 17 g packet TAKE 17 G BY MOUTH DAILY. 14 each 0   valACYclovir (VALTREX) 500 MG tablet Take 1 tablet (500 mg total) by mouth daily. 30 tablet 0   vitamin B-12 (CYANOCOBALAMIN) 100 MCG tablet Take 1 tablet (100 mcg total) by mouth daily. 30 tablet 0   No facility-administered medications prior to visit.     ROS Review of Systems  Constitutional:  Negative for activity change, appetite change and fatigue.  HENT:  Negative for congestion, sinus pressure and sore throat.   Eyes:  Negative for visual disturbance.  Respiratory:  Negative for cough, chest tightness, shortness of breath and wheezing.   Cardiovascular:  Negative for chest pain and palpitations.  Gastrointestinal:  Negative for abdominal distention, abdominal pain and constipation.  Endocrine: Negative for polydipsia.  Genitourinary:  Negative for dysuria and frequency.  Musculoskeletal:  Negative for arthralgias and back pain.  Skin:  Negative for rash.  Neurological:  Positive for numbness. Negative for tremors and light-headedness.  Hematological:  Does not bruise/bleed easily.  Psychiatric/Behavioral:  Negative for agitation and behavioral problems.    Objective:  BP 120/79   Pulse 91   Ht 5' 3"  (1.6 m)   Wt 145 lb 6.4 oz (66 kg)   SpO2 100%   BMI 25.76 kg/m   BP/Weight 06/29/2021 3/66/2947 03/26/4649  Systolic BP 354 656 812  Diastolic BP 79 84 77  Wt. (Lbs)  145.4 143.1 -  BMI 25.76 25.35 -      Physical Exam Constitutional:      Appearance: She is well-developed.  Cardiovascular:     Rate and Rhythm: Normal rate.     Heart sounds: Normal heart sounds. No murmur heard. Pulmonary:     Effort: Pulmonary effort is normal.     Breath sounds: Normal breath sounds. No wheezing or rales.     Comments: Port A cath in left upper chest wall Chest:     Chest wall: No tenderness.  Abdominal:  General: Bowel sounds are normal. There is no distension.     Palpations: Abdomen is soft. There is no mass.     Tenderness: There is no abdominal tenderness.  Musculoskeletal:        General: Normal range of motion.     Right lower leg: No edema.     Left lower leg: No edema.  Neurological:     Mental Status: She is alert and oriented to person, place, and time.  Psychiatric:        Mood and Affect: Mood normal.    CMP Latest Ref Rng & Units 06/13/2021 05/23/2021 05/03/2021  Glucose 70 - 99 mg/dL 246(H) 226(H) 222(H)  BUN 6 - 20 mg/dL 10 10 12   Creatinine 0.44 - 1.00 mg/dL 0.66 0.66 0.66  Sodium 135 - 145 mmol/L 139 140 141  Potassium 3.5 - 5.1 mmol/L 3.9 3.9 3.7  Chloride 98 - 111 mmol/L 104 102 101  CO2 22 - 32 mmol/L 21(L) 26 26  Calcium 8.9 - 10.3 mg/dL 9.3 10.1 9.9  Total Protein 6.5 - 8.1 g/dL 7.1 7.4 7.2  Total Bilirubin 0.3 - 1.2 mg/dL 0.3 0.3 0.3  Alkaline Phos 38 - 126 U/L 84 92 82  AST 15 - 41 U/L 23 18 13(L)  ALT 0 - 44 U/L 27 23 13     Lipid Panel     Component Value Date/Time   CHOL 116 11/11/2020 0908   TRIG 178 (H) 11/11/2020 0908   HDL 46 11/11/2020 0908   CHOLHDL 2.5 11/11/2020 0908   CHOLHDL 5.5 (H) 11/02/2016 1102   VLDL 80 (H) 11/02/2016 1102   LDLCALC 41 11/11/2020 0908    CBC    Component Value Date/Time   WBC 5.1 06/13/2021 0820   WBC 6.7 01/26/2021 0824   RBC 3.81 (L) 06/13/2021 0820   HGB 10.9 (L) 06/13/2021 0820   HCT 33.8 (L) 06/13/2021 0820   PLT 265 06/13/2021 0820   MCV 88.7 06/13/2021 0820    MCH 28.6 06/13/2021 0820   MCHC 32.2 06/13/2021 0820   RDW 16.9 (H) 06/13/2021 0820   LYMPHSABS 1.0 06/13/2021 0820   MONOABS 0.8 06/13/2021 0820   EOSABS 0.1 06/13/2021 0820   BASOSABS 0.0 06/13/2021 0820    Lab Results  Component Value Date   HGBA1C 7.5 (A) 06/29/2021    Assessment & Plan:  1. Type 2 diabetes mellitus without complication, without long-term current use of insulin (HCC) Not fully optimized with A1c 7.5; goal is <7.0 Likely due to increased carb intake No regimen change but advised to work on diet Counseled on Diabetic diet, my plate method, 409 minutes of moderate intensity exercise/week Blood sugar logs with fasting goals of 80-120 mg/dl, random of less than 180 and in the event of sugars less than 60 mg/dl or greater than 400 mg/dl encouraged to notify the clinic. Advised on the need for annual eye exams, annual foot exams, Pneumonia vaccine. - POCT glucose (manual entry) - POCT glycosylated hemoglobin (Hb A1C) - dapagliflozin propanediol (FARXIGA) 10 MG TABS tablet; Take 1 tablet (10 mg total) by mouth daily with breakfast.  Dispense: 90 tablet; Refill: 1 - metFORMIN (GLUCOPHAGE) 500 MG tablet; Take 2 tablets (1,000 mg total) by mouth 2 (two) times daily.  Dispense: 180 tablet; Refill: 1  2. 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  Dispense: 60 capsule; Refill: 3  3. Malignant neoplasm of lower-inner quadrant of right breast of female, estrogen receptor positive ([place]) S/p chemo Undergoing work up for elective breast conservative surgery Follow up with MedOnc and General surgery  4. Pruritus I have provided her a letter for her apartment - hydrOXYzine (ATARAX/VISTARIL) 25 MG tablet; Take 1 tablet (25 mg total) by mouth 3  (three) times daily as needed.  Dispense: 60 tablet; Refill: 1    No orders of the defined types were placed in this encounter.   Return in about 3 months (around 09/28/2021) for Diabetes.       [name], MD, FAAFP. [facility] and [place], [place]   06/29/2021, 11:45 AM

## 2021-06-29 NOTE — Patient Instructions (Signed)
Peripheral Neuropathy ?Peripheral neuropathy is a type of nerve damage. It affects nerves that carry signals between the spinal cord and the arms, legs, and the rest of the body (peripheral nerves). It does not affect nerves in the spinal cord or brain. In peripheral neuropathy, one nerve or a group of nerves may be damaged. Peripheral neuropathy is a broad category that includes many specific nerve disorders, like diabetic neuropathy, hereditary neuropathy, and carpal tunnel syndrome. ?What are the causes? ?This condition may be caused by: ?Diabetes. This is the most common cause of peripheral neuropathy. ?Nerve injury. ?Pressure or stress on a nerve that lasts a long time. ?Lack (deficiency) of B vitamins. This can result from alcoholism, poor diet, or a restricted diet. ?Infections. ?Autoimmune diseases, such as rheumatoid arthritis and systemic lupus erythematosus. ?Nerve diseases that are passed from parent to child (inherited). ?Some medicines, such as cancer medicines (chemotherapy). ?Poisonous (toxic) substances, such as lead and mercury. ?Too little blood flowing to the legs. ?Kidney disease. ?Thyroid disease. ?In some cases, the cause of this condition is not known. ?What are the signs or symptoms? ?Symptoms of this condition depend on which of your nerves is damaged. Common symptoms include: ?Loss of feeling (numbness) in the feet, hands, or both. ?Tingling in the feet, hands, or both. ?Burning pain. ?Very sensitive skin. ?Weakness. ?Not being able to move a part of the body (paralysis). ?Muscle twitching. ?Clumsiness or poor coordination. ?Loss of balance. ?Not being able to control your bladder. ?Feeling dizzy. ?Sexual problems. ?How is this diagnosed? ?Diagnosing and finding the cause of peripheral neuropathy can be difficult. Your health care provider will take your medical history and do a physical exam. A neurological exam will also be done. This involves checking things that are affected by your  brain, spinal cord, and nerves (nervous system). For example, your health care provider will check your reflexes, how you move, and what you can feel. ?You may have other tests, such as: ?Blood tests. ?Electromyogram (EMG) and nerve conduction tests. These tests check nerve function and how well the nerves are controlling the muscles. ?Imaging tests, such as CT scans or MRI to rule out other causes of your symptoms. ?Removing a small piece of nerve to be examined in a lab (nerve biopsy). ?Removing and examining a small amount of the fluid that surrounds the brain and spinal cord (lumbar puncture). ?How is this treated? ?Treatment for this condition may involve: ?Treating the underlying cause of the neuropathy, such as diabetes, kidney disease, or vitamin deficiencies. ?Stopping medicines that can cause neuropathy, such as chemotherapy. ?Medicine to help relieve pain. Medicines may include: ?Prescription or over-the-counter pain medicine. ?Antiseizure medicine. ?Antidepressants. ?Pain-relieving patches that are applied to painful areas of skin. ?Surgery to relieve pressure on a nerve or to destroy a nerve that is causing pain. ?Physical therapy to help improve movement and balance. ?Devices to help you move around (assistive devices). ?Follow these instructions at home: ?Medicines ?Take over-the-counter and prescription medicines only as told by your health care provider. Do not take any other medicines without first asking your health care provider. ?Do not drive or use heavy machinery while taking prescription pain medicine. ?Lifestyle ? ?Do not use any products that contain nicotine or tobacco, such as cigarettes and e-cigarettes. Smoking keeps blood from reaching damaged nerves. If you need help quitting, ask your health care provider. ?Avoid or limit alcohol. Too much alcohol can cause a vitamin B deficiency, and vitamin B is needed for healthy nerves. ?Eat a   healthy diet. This includes: ?Eating foods that are  high in fiber, such as fresh fruits and vegetables, whole grains, and beans. ?Limiting foods that are high in fat and processed sugars, such as fried or sweet foods. ?General instructions ? ?If you have diabetes, work closely with your health care provider to keep your blood sugar under control. ?If you have numbness in your feet: ?Check every day for signs of injury or infection. Watch for redness, warmth, and swelling. ?Wear padded socks and comfortable shoes. These help protect your feet. ?Develop a good support system. Living with peripheral neuropathy can be stressful. Consider talking with a mental health specialist or joining a support group. ?Use assistive devices and attend physical therapy as told by your health care provider. This may include using a walker or a cane. ?Keep all follow-up visits as told by your health care provider. This is important. ?Contact a health care provider if: ?You have new signs or symptoms of peripheral neuropathy. ?You are struggling emotionally from dealing with peripheral neuropathy. ?Your pain is not well-controlled. ?Get help right away if: ?You have an injury or infection that is not healing normally. ?You develop new weakness in an arm or leg. ?You have fallen or do so frequently. ?Summary ?Peripheral neuropathy is when the nerves in the arms, or legs are damaged, resulting in numbness, weakness, or pain. ?There are many causes of peripheral neuropathy, including diabetes, pinched nerves, vitamin deficiencies, autoimmune disease, and hereditary conditions. ?Diagnosing and finding the cause of peripheral neuropathy can be difficult. Your health care provider will take your medical history, do a physical exam, and do tests, including blood tests and nerve function tests. ?Treatment involves treating the underlying cause of the neuropathy and taking medicines to help control pain. Physical therapy and assistive devices may also help. ?This information is not intended to  replace advice given to you by your health care provider. Make sure you discuss any questions you have with your health care provider. ?Document Revised: 07/19/2020 Document Reviewed: 07/19/2020 ?Elsevier Patient Education ? 2022 [employer] ? ?

## 2021-06-29 NOTE — Progress Notes (Signed)
Pt has itching all over body.

## 2021-06-30 ENCOUNTER — Encounter (HOSPITAL_BASED_OUTPATIENT_CLINIC_OR_DEPARTMENT_OTHER): Payer: Self-pay | Admitting: Surgery

## 2021-06-30 ENCOUNTER — Other Ambulatory Visit: Payer: Self-pay

## 2021-07-03 ENCOUNTER — Encounter (HOSPITAL_BASED_OUTPATIENT_CLINIC_OR_DEPARTMENT_OTHER)
Admission: RE | Admit: 2021-07-03 | Discharge: 2021-07-03 | Disposition: A | Discharge status: Home / Self Care | Payer: 59 | Source: Ambulatory Visit | Attending: Surgery | Admitting: Surgery

## 2021-07-03 DIAGNOSIS — C50911 Malignant neoplasm of unspecified site of right female breast: Secondary | ICD-10-CM | POA: Diagnosis not present

## 2021-07-03 DIAGNOSIS — Z01812 Encounter for preprocedural laboratory examination: Secondary | ICD-10-CM | POA: Insufficient documentation

## 2021-07-03 LAB — CBC WITH DIFFERENTIAL/PLATELET
Abs Immature Granulocytes: 0.02 10*3/uL (ref 0.00–0.07)
Basophils Absolute: 0 10*3/uL (ref 0.0–0.1)
Basophils Relative: 1 %
Eosinophils Absolute: 0.2 10*3/uL (ref 0.0–0.5)
Eosinophils Relative: 6 %
HCT: 39 % (ref 36.0–46.0)
Hemoglobin: 12.5 g/dL (ref 12.0–15.0)
Immature Granulocytes: 1 %
Lymphocytes Relative: 26 %
Lymphs Abs: 1 10*3/uL (ref 0.7–4.0)
MCH: 28 pg (ref 26.0–34.0)
MCHC: 32.1 g/dL (ref 30.0–36.0)
MCV: 87.4 fL (ref 80.0–100.0)
Monocytes Absolute: 0.4 10*3/uL (ref 0.1–1.0)
Monocytes Relative: 11 %
Neutro Abs: 2.2 10*3/uL (ref 1.7–7.7)
Neutrophils Relative %: 55 %
Platelets: 258 10*3/uL (ref 150–400)
RBC: 4.46 MIL/uL (ref 3.87–5.11)
RDW: 13.6 % (ref 11.5–15.5)
WBC: 4 10*3/uL (ref 4.0–10.5)
nRBC: 0 % (ref 0.0–0.2)

## 2021-07-03 LAB — COMPREHENSIVE METABOLIC PANEL
ALT: 31 U/L (ref 0–44)
AST: 31 U/L (ref 15–41)
Albumin: 4.1 g/dL (ref 3.5–5.0)
Alkaline Phosphatase: 73 U/L (ref 38–126)
Anion gap: 13 (ref 5–15)
BUN: 10 mg/dL (ref 6–20)
CO2: 25 mmol/L (ref 22–32)
Calcium: 9 mg/dL (ref 8.9–10.3)
Chloride: 100 mmol/L (ref 98–111)
Creatinine, Ser: 0.45 mg/dL (ref 0.44–1.00)
GFR, Estimated: >60 mL/min (ref >60)
Glucose, Bld: 168 mg/dL — ABNORMAL HIGH (ref 70–99)
Potassium: 3.8 mmol/L (ref 3.5–5.1)
Sodium: 138 mmol/L (ref 135–145)
Total Bilirubin: 0.9 mg/dL (ref 0.3–1.2)
Total Protein: 6.8 g/dL (ref 6.5–8.1)

## 2021-07-03 NOTE — Progress Notes (Signed)

## 2021-07-05 ENCOUNTER — Telehealth: Payer: Self-pay | Admitting: Family Medicine

## 2021-07-05 NOTE — Telephone Encounter (Signed)
Copied from [place] [phone]. Topic: General - Other >> Jun 29, 2021 12:50 PM Alanda Slim E wrote: Reason for CRM:  Tonya. The Secondary school teacher from [facility] wanted to note that they have given Pt a portable AC unit and  4 fans/ but the board of the Hvac unit is gone and needs to be replaced/ the part has been ordered and waiting to receive / Matinence is going to see what else they can do today / she stated pt doesn't run fans to prevent from having higher energy bill/ she stated she has received 2 letters from Dr. Margarita Rana but is waiting on the parts to repair pts Hvac / Kenney Houseman asked if DR. Newlin can please give her a call if needed

## 2021-07-10 ENCOUNTER — Ambulatory Visit
Admission: RE | Admit: 2021-07-10 | Discharge: 2021-07-10 | Disposition: A | Discharge status: Home / Self Care | Payer: 59 | Source: Ambulatory Visit | Attending: Surgery | Admitting: Surgery

## 2021-07-10 ENCOUNTER — Other Ambulatory Visit: Payer: Self-pay

## 2021-07-10 DIAGNOSIS — C50311 Malignant neoplasm of lower-inner quadrant of right female breast: Secondary | ICD-10-CM

## 2021-07-10 DIAGNOSIS — C50911 Malignant neoplasm of unspecified site of right female breast: Secondary | ICD-10-CM

## 2021-07-10 IMAGING — US US NEEDLE LOCALIZATION*R*
1 series · 2 of 2 positions shown · non-contrast
Comparison: Previous exam(s).

CLINICAL DATA: Patient presents for localization of a right breast
carcinoma and a metastatic right axillary lymph node prior to
surgical excision. Breast lesion localized with mammographic
guidance and axillary lymph node localized with sonographic
guidance.

EXAM:
ULTRASOUND GUIDED RADIOACTIVE SEED LOCALIZATION OF THE RIGHT AXILLA
MAMMOGRAPHIC GUIDED RADIOACTIVE SEED LOCALIZATION OF THE RIGHT
BREAST

[Series 1: us needle localization*right* · 0.07mm/px · 2 of 2 slices shown]
[im 1/2]
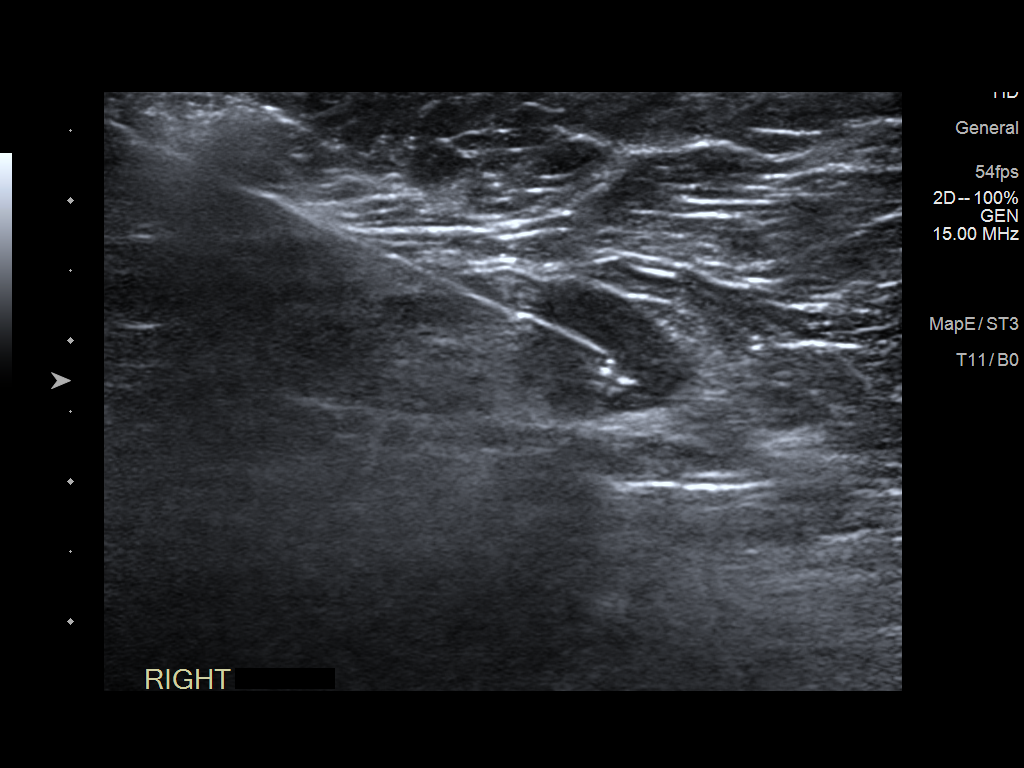
[im 2/2]
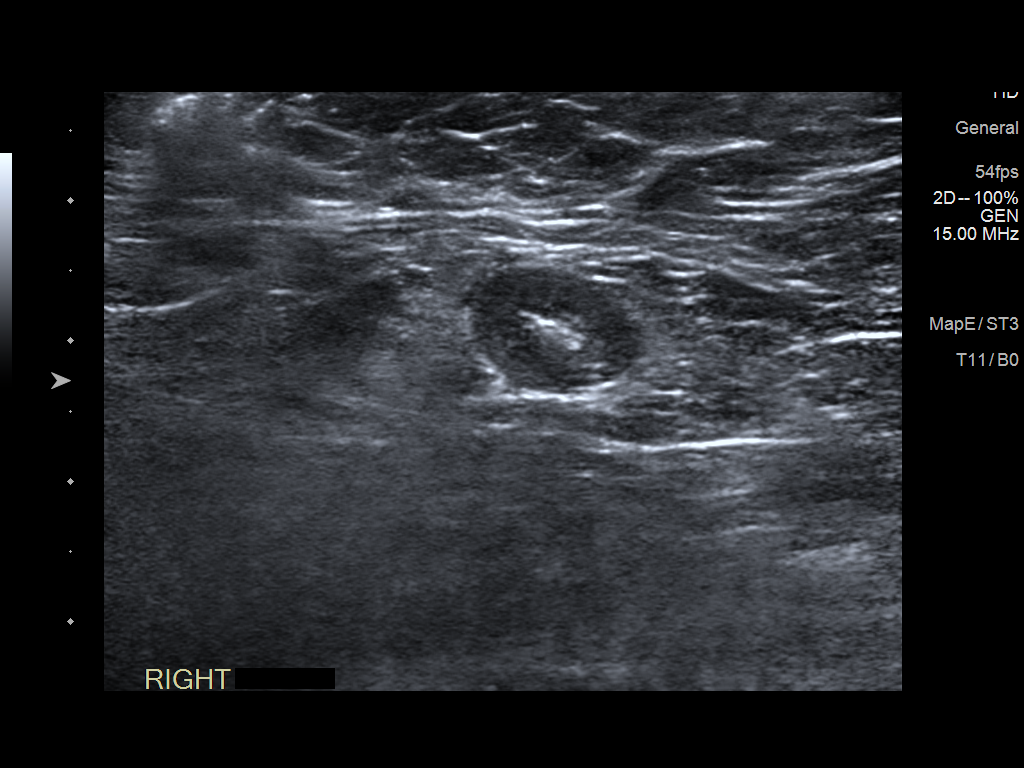

[2 of 2 positions shown; findings below may reference images not displayed]

FINDINGS: Patient presents for radioactive seed localization prior to . I met
with the patient and we discussed the procedure of seed localization
including benefits and alternatives. We discussed the high
likelihood of a successful procedure. We discussed the risks of the
procedure including infection, bleeding, tissue injury and further
surgery. We discussed the low dose of radioactivity involved in the
procedure. Informed, written consent was given.

The usual time-out protocol was performed immediately prior to the
procedure.

Metastatic right axillary lymph node

Using ultrasound guidance, sterile technique, 1% lidocaine and an
[HB] radioactive seed, the metastatic right axillary lymph node and
associated Tribell marker clip were localized using a inferior
approach. The lymph node could not be visualized mammographically,
but the seed was well seen deploying within the node on sonography.

Follow-up survey of the patient confirms presence of the radioactive
seed.

Order number of [HB] seed:  [PHONE_NUMBER].

Total activity:  0.245 millicuries reference Date: [DATE]

Right breast carcinoma

Using mammographic guidance, sterile technique, 1% lidocaine and an
[HB] radioactive seed, the ribbon shaped biopsy clip was localized
using a medial approach. The follow-up mammogram images confirm the
seed in the expected location and were marked for Dr. BAUTII.

Follow-up survey of the patient confirms the presence of a
radioactive seed within the inferior breast.

Order number of [HB] seed:  [PHONE_NUMBER].

Total activity:  0.238 millicuries reference Date: [DATE]

The patient tolerated the procedure well and was released from the
[REDACTED]. She was given instructions regarding seed removal.
IMPRESSION: Radioactive seed localization of the right axilla and right breast.
No apparent complications.

## 2021-07-10 IMAGING — MG MM PLC BREAST LOC DEV 1ST LESION INC*R*
5 series · 5 of 5 positions shown · non-contrast
Comparison: Previous exam(s).

CLINICAL DATA: Patient presents for localization of a right breast
carcinoma and a metastatic right axillary lymph node prior to
surgical excision. Breast lesion localized with mammographic
guidance and axillary lymph node localized with sonographic
guidance.

EXAM:
ULTRASOUND GUIDED RADIOACTIVE SEED LOCALIZATION OF THE RIGHT AXILLA
MAMMOGRAPHIC GUIDED RADIOACTIVE SEED LOCALIZATION OF THE RIGHT
BREAST

[R ML (1 of 4)]
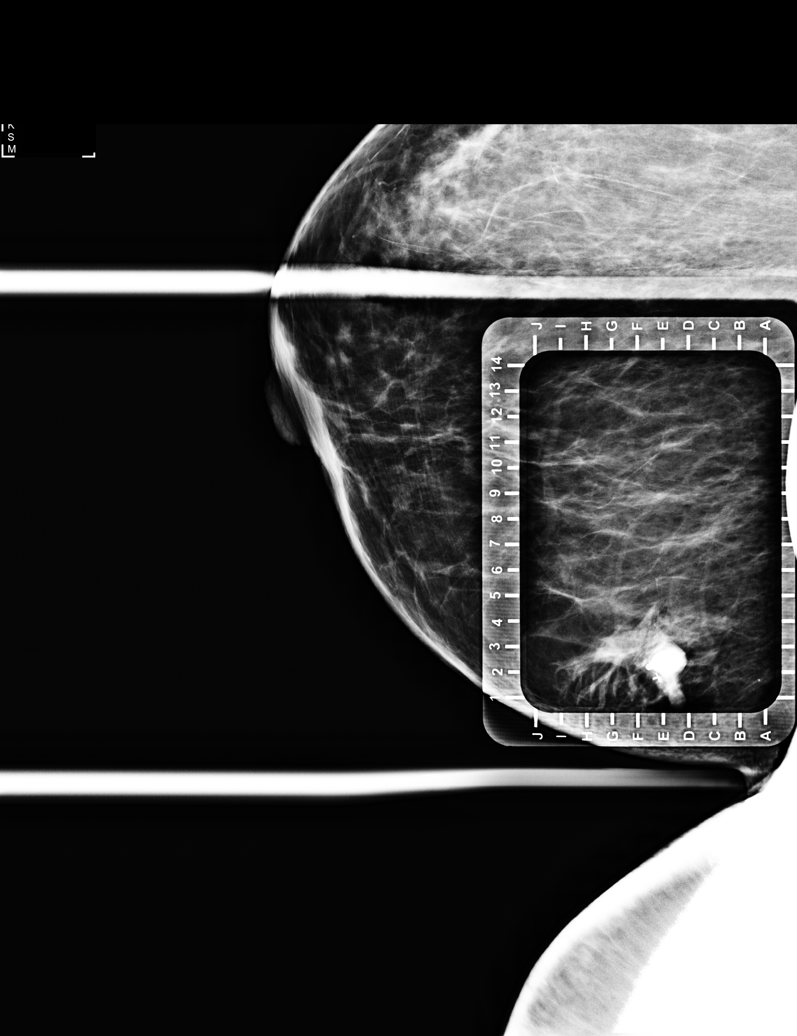

[R ML (2 of 4)]
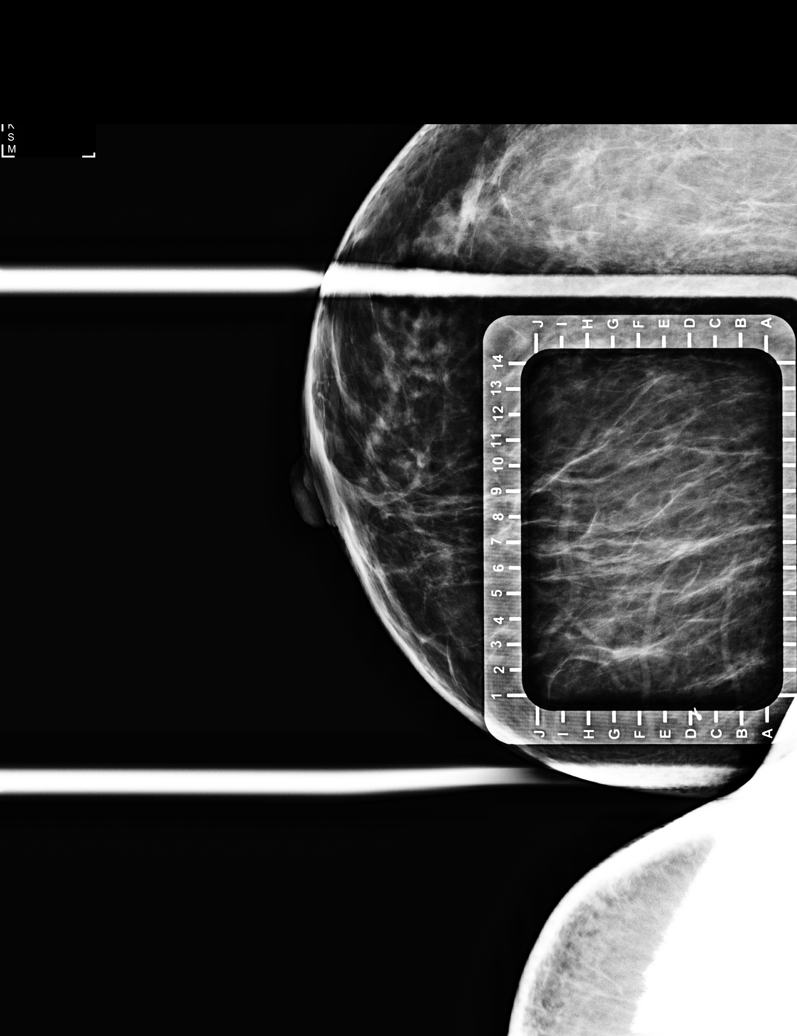

[R ML (3 of 4)]
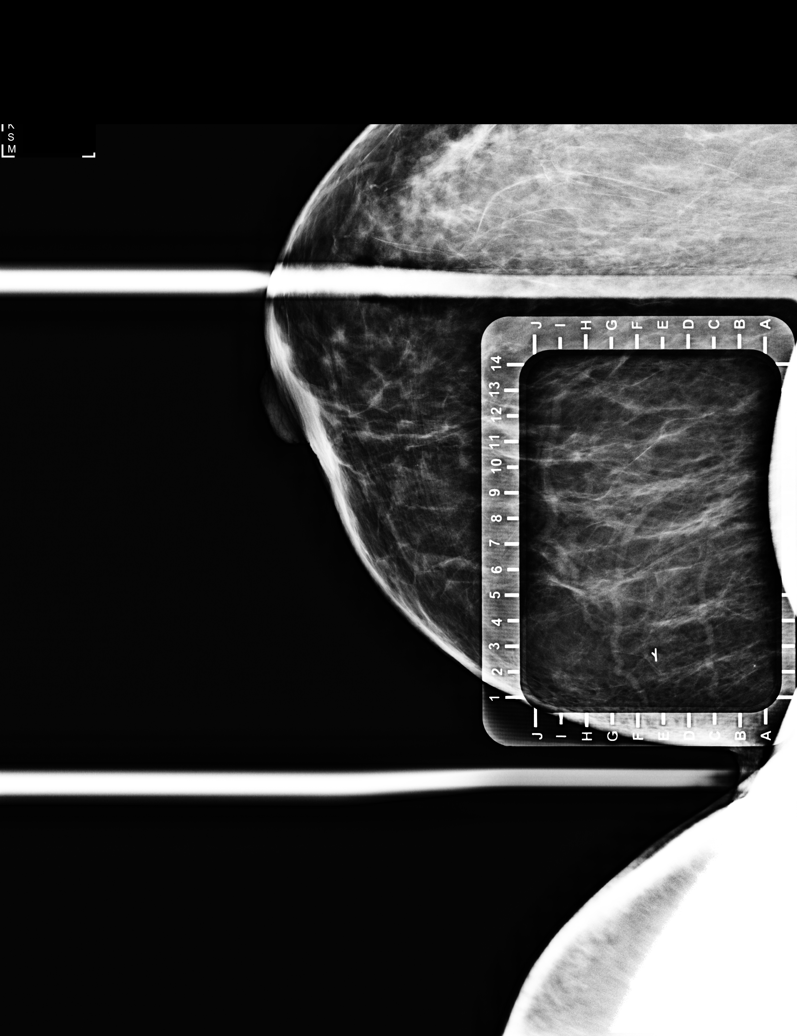

[R ML (4 of 4)]
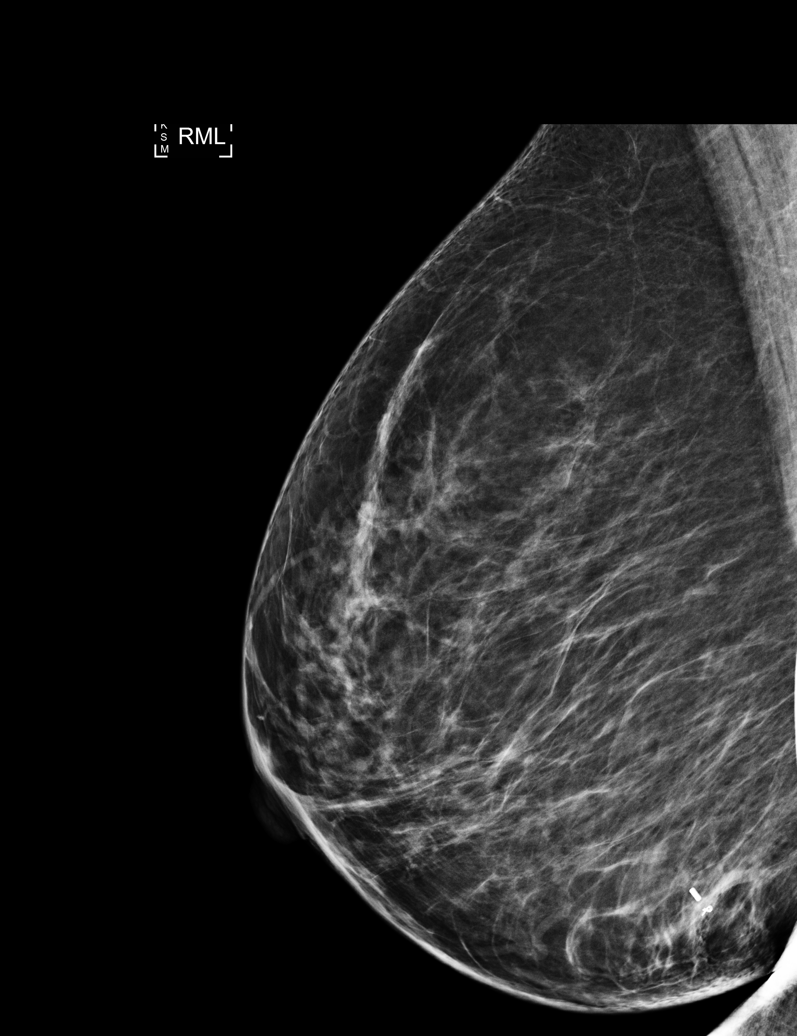

[R CC]
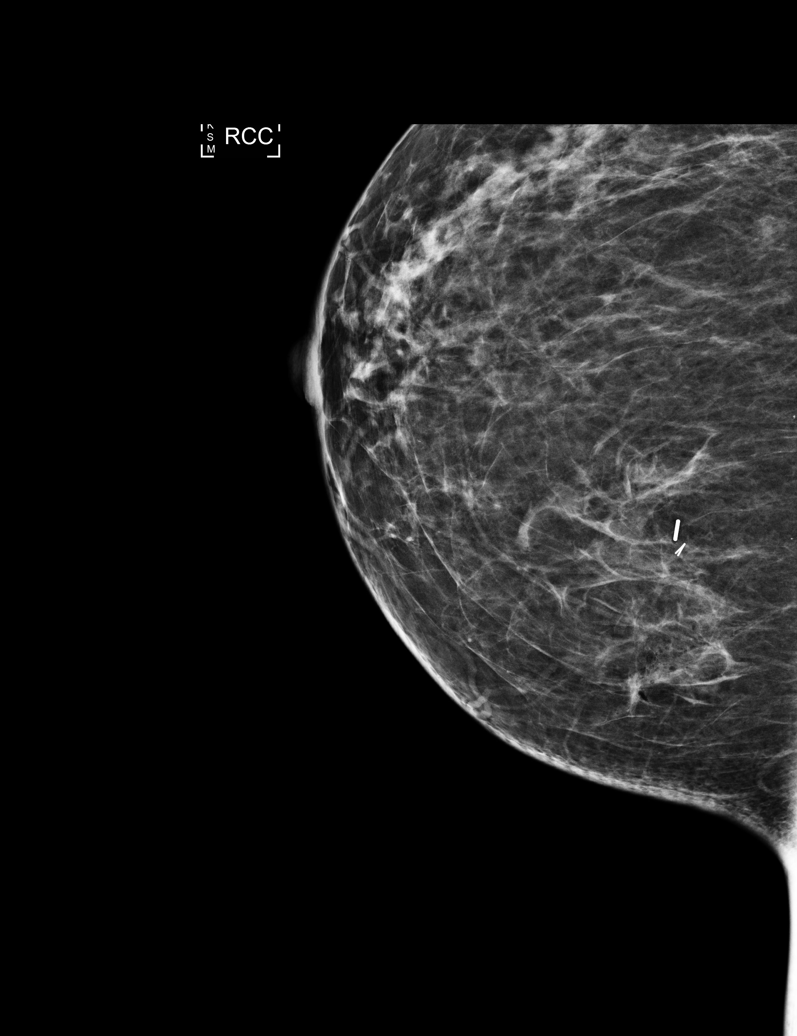

[5 of 5 positions shown; findings below may reference images not displayed]

FINDINGS: Patient presents for radioactive seed localization prior to . I met
with the patient and we discussed the procedure of seed localization
including benefits and alternatives. We discussed the high
likelihood of a successful procedure. We discussed the risks of the
procedure including infection, bleeding, tissue injury and further
surgery. We discussed the low dose of radioactivity involved in the
procedure. Informed, written consent was given.

The usual time-out protocol was performed immediately prior to the
procedure.

Metastatic right axillary lymph node

Using ultrasound guidance, sterile technique, 1% lidocaine and an
[HB] radioactive seed, the metastatic right axillary lymph node and
associated Tribell marker clip were localized using a inferior
approach. The lymph node could not be visualized mammographically,
but the seed was well seen deploying within the node on sonography.

Follow-up survey of the patient confirms presence of the radioactive
seed.

Order number of [HB] seed:  [PHONE_NUMBER].

Total activity:  0.245 millicuries reference Date: [DATE]

Right breast carcinoma

Using mammographic guidance, sterile technique, 1% lidocaine and an
[HB] radioactive seed, the ribbon shaped biopsy clip was localized
using a medial approach. The follow-up mammogram images confirm the
seed in the expected location and were marked for Dr. BAUTII.

Follow-up survey of the patient confirms the presence of a
radioactive seed within the inferior breast.

Order number of [HB] seed:  [PHONE_NUMBER].

Total activity:  0.238 millicuries reference Date: [DATE]

The patient tolerated the procedure well and was released from the
[REDACTED]. She was given instructions regarding seed removal.
IMPRESSION: Radioactive seed localization of the right axilla and right breast.
No apparent complications.

## 2021-07-10 NOTE — Progress Notes (Signed)
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 However she is scheduled for surgery this AM  Accordingly we are holding today's treatment-- she will see me 08/01/2021 with her next herceptin dose   REVIEW OF SYSTEMS: [place]   COVID 19 VACCINATION STATUS: fully vaccinated [employer]), with booster 12/2020   HISTORY OF CURRENT ILLNESS: From the original intake note:  Rebecca [name] herself palpated a mass in the right axilla. She underwent bilateral diagnostic mammography with tomography and right breast ultrasonography at [facility] on 01/03/2021 showing: breast density category B; two adjacent suspicious right breast masses at 5 o'clock; markedly abnormal right axillary lymph node; no evidence of malignancy on left.  Accordingly on 01/06/2021 she proceeded to biopsy of the right breast area in question. The pathology from this procedure ([id number]) showed: invasive ductal carcinoma, grade 3. Prognostic indicators significant for: estrogen receptor, 10% positive with weak staining intensity and progesterone receptor, 0% negative. Proliferation marker Ki67 at 90%. HER2 equivocal by immunohistochemistry (2+), but negative by fluorescent in situ hybridization with a  signals ratio 1.38 and number per cell 3.63.  Cancer Staging Malignant neoplasm of lower-inner quadrant of right breast of female, estrogen receptor positive ([place]) Staging form: Breast, AJCC 8th Edition - Clinical: Stage IIIB (cT2, cN1, cM0, G3, ER-, PR-, HER2-) - Signed by Chauncey Cruel, MD on 01/18/2021 Stage prefix: Initial diagnosis Histologic grading system: 3 grade system  [name] is as detailed below.   PAST MEDICAL HISTORY: Past Medical History:  Diagnosis Date   Arthritis    Breast cancer ([place])    Diabetes mellitus    Family history of lymphoma 01/18/2021   Family history of throat cancer 01/18/2021   Hyperlipidemia    Hypertension     PAST SURGICAL HISTORY: Past Surgical History:  Procedure Laterality Date   CESAREAN SECTION     PORTACATH PLACEMENT N/A 01/26/2021   Procedure: INSERTION PORT-A-CATH;  Surgeon: Erroll Luna, MD;  Location: [facility];  Service: General;  Laterality: N/A;  15    FAMILY HISTORY: Family History  Problem Relation Age of Onset   Diabetes Mother    Heart disease Mother    Diabetes Father    Throat cancer Father        d. 29s   Diabetes Sister    Diabetes Brother    Lymphoma Cousin        maternal cousin; d. 14s   Her father died in his 87's and her mother in her 94'H, both from complications of diabetes. Rebecca [name] has 6 brothers and 1 sister. There is no family history of breast or ovarian cancer to her knowledge.   GYNECOLOGIC HISTORY:  No LMP recorded. Menarche: 55 years old [place] P 0 LMP 10/31/2020 (before that, 06/2020) Contraceptive used from 2017-2018 HRT n/a  Hysterectomy? no BSO? no   SOCIAL HISTORY: (updated 12/2020)  Rebecca [name]  is originally from [place]. 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 She is divorced. She lives at home with her nephew [name], who is 46 and works for [employer]. She attends [employer]  ([employer]).    ADVANCED DIRECTIVES: not in place; at the 01/18/2021 visit she stated her ex-husband [name], who is disabled due to past injuries, [phone] would be the person to contact if she became incapacitated [the patient states aside from her nephew she has no other relatives in [place]: Social History   Tobacco Use   Smoking status: Never    Passive exposure: Yes   Smokeless tobacco: Never  Substance Use Topics   Alcohol use: No   Drug use: No     Colonoscopy: never done, referral in place  PAP: 12/2020, negative  Bone density: never done   Allergies  Allergen Reactions   Lisinopril Swelling    Swelling in throat, coughing, difficulty swallowing, could not sleep. Occurred Dec 2020.    Current Outpatient Medications  Medication Sig Dispense Refill   amLODipine (NORVASC) 10 MG tablet TAKE 1 TABLET BY MOUTH EVERY DAY 90 tablet 0   Ascorbic Acid (VITAMIN C) 1000 MG tablet Take 1 tablet (1,000 mg total) by mouth daily. 30 tablet 0   atorvastatin (LIPITOR) 80 MG tablet TAKE 1 TABLET BY MOUTH EVERY DAY 90 tablet 1   Blood Glucose Monitoring Suppl (ONETOUCH VERIO REFLECT) w/Device KIT 1 kit by Does not apply route in the morning, at noon, and at bedtime. Use as directed to test blood sugar up to three times daily.E11.9 1 kit 0   cetirizine (ZYRTEC) 10 MG tablet Take 1 tablet (10 mg total) by mouth daily. 30 tablet 0   cloNIDine (CATAPRES) 0.1 MG tablet Take 1 tablet (0.1 mg total) by mouth at bedtime as needed. 90 tablet 1   clotrimazole (CLOTRIMAZOLE AF) 1 % cream Apply 1 application topically 2 (two) times daily. 30 g 1   cyclobenzaprine (FLEXERIL) 10 MG tablet Take 10 mg by mouth 3 (three) times daily as needed for muscle spasms.     dapagliflozin propanediol (FARXIGA) 10 MG TABS tablet Take 1 tablet (10 mg total) by mouth daily with breakfast. 90 tablet 1   ergocalciferol (DRISDOL) 1.25 MG (50000 UT) capsule Take 1 capsule (50,000 Units  total) by mouth once a week. 12 capsule 0   gabapentin (NEURONTIN) 300 MG capsule Take 1 capsule (300 mg total) by mouth 2 (two) times daily. 60 capsule 3   glipiZIDE (GLUCOTROL) 5 MG tablet TAKE 1 TABLET BY MOUTH DAILY 90 tablet 0   glucose blood (ONETOUCH VERIO) test strip Use as directed to test blood sugar up to three times daily. E11.9 100 each 6   hydrOXYzine (ATARAX/VISTARIL) 25 MG tablet Take 1 tablet (25 mg total) by mouth 3 (three) times daily as needed. 60 tablet 1   Lancets (ONETOUCH DELICA PLUS [id number]) MISC 1 Device by Does not apply route in the morning, at noon, and at bedtime. Use as directed to test blood sugar up to three times daily. E11.9 100 each 6   lidocaine-prilocaine (EMLA) cream APPLY TO AFFECTED AREA AS DIRECTED 30 g 3   metFORMIN (GLUCOPHAGE) 500 MG tablet Take 2 tablets (1,000 mg total) by mouth 2 (two) times daily. 180 tablet 1   naproxen (NAPROSYN) 500 MG tablet Take 1 tablet (500 mg total) by mouth 2 (two) times daily with a  meal. 30 tablet 1   Omega-3 1000 MG CAPS Take 1 capsule (1,000 mg total) by mouth daily. 30 capsule 0   omeprazole (PRILOSEC) 20 MG capsule Take 1 capsule (20 mg total) by mouth daily. 30 capsule 0   polyethylene glycol (MIRALAX / GLYCOLAX) 17 g packet TAKE 17 G BY MOUTH DAILY. 14 each 0   valACYclovir (VALTREX) 500 MG tablet Take 1 tablet (500 mg total) by mouth daily. 30 tablet 0   vitamin B-12 (CYANOCOBALAMIN) 100 MCG tablet Take 1 tablet (100 mcg total) by mouth daily. 30 tablet 0   No current facility-administered medications for this visit.    OBJECTIVE: [place] woman in no acute distress  There were no vitals filed for this visit.    There is no height or weight on file to calculate BMI.   Wt Readings from Last 3 Encounters:  06/29/21 145 lb 6.4 oz (66 kg)  06/13/21 143 lb 1.6 oz (64.9 kg)  05/23/21 141 lb 11.2 oz (64.3 kg)      ECOG FS:1 - Symptomatic but completely ambulatory    LAB RESULTS:  CMP     Component Value  Date/Time   NA 138 07/03/2021 1254   NA 140 12/27/2020 1123   K 3.8 07/03/2021 1254   CL 100 07/03/2021 1254   CO2 25 07/03/2021 1254   GLUCOSE 168 (H) 07/03/2021 1254   BUN 10 07/03/2021 1254   BUN 11 12/27/2020 1123   CREATININE 0.45 07/03/2021 1254   CREATININE 0.66 06/13/2021 0820   CREATININE 0.50 11/02/2016 1102   CALCIUM 9.0 07/03/2021 1254   PROT 6.8 07/03/2021 1254   PROT 7.5 12/27/2020 1123   ALBUMIN 4.1 07/03/2021 1254   ALBUMIN 4.7 12/27/2020 1123   AST 31 07/03/2021 1254   AST 23 06/13/2021 0820   ALT 31 07/03/2021 1254   ALT 27 06/13/2021 0820   ALKPHOS 73 07/03/2021 1254   BILITOT 0.9 07/03/2021 1254   BILITOT 0.3 06/13/2021 0820   GFRNONAA >60 07/03/2021 1254   GFRNONAA >60 06/13/2021 0820   GFRNONAA >89 01/16/2016 1006   GFRAA 118 06/02/2020 0942   GFRAA >89 01/16/2016 1006    No results found for: TOTALPROTELP, ALBUMINELP, A1GS, A2GS, BETS, BETA2SER, GAMS, MSPIKE, SPEI  Lab Results  Component Value Date   WBC 4.0 07/03/2021   NEUTROABS 2.2 07/03/2021   HGB 12.5 07/03/2021   HCT 39.0 07/03/2021   MCV 87.4 07/03/2021   PLT 258 07/03/2021    No results found for: LABCA2  No components found for: BVAPOL410  No results for input(s): INR in the last 168 hours.  No results found for: LABCA2  No results found for: VUD314  No results found for: HOO875  No results found for: ZVJ282  No results found for: CA2729  No components found for: HGQUANT  No results found for: CEA1 / No results found for: CEA1   No results found for: AFPTUMOR  No results found for: CHROMOGRNA  No results found for: KPAFRELGTCHN, LAMBDASER, KAPLAMBRATIO (kappa/lambda light chains)  No results found for: HGBA, HGBA2QUANT, HGBFQUANT, HGBSQUAN (Hemoglobinopathy evaluation)   No results found for: LDH  No results found for: IRON, TIBC, IRONPCTSAT (Iron and TIBC)  No results found for: FERRITIN  Urinalysis    Component Value Date/Time   COLORURINE YELLOW  06/24/2007 0116   APPEARANCEUR CLOUDY (A) 06/24/2007 0116   LABSPEC 1.025 09/18/2020 1125   PHURINE 5.5 09/18/2020 1125   GLUCOSEU 100 (A) 09/18/2020 1125   HGBUR TRACE (A)  09/18/2020 1125   [place] 09/18/2020 1125   [place] 09/18/2020 1125   PROTEINUR NEGATIVE 09/18/2020 1125   UROBILINOGEN 0.2 09/18/2020 1125   NITRITE NEGATIVE 09/18/2020 1125   LEUKOCYTESUR NEGATIVE 09/18/2020 1125    STUDIES: MM RT RADIOACTIVE SEED LOC MAMMO GUIDE  Result Date: 07/10/2021 CLINICAL DATA:  Patient presents for localization of a right breast carcinoma and a metastatic right axillary lymph node prior to surgical excision. Breast lesion localized with mammographic guidance and axillary lymph node localized with sonographic guidance. EXAM: ULTRASOUND GUIDED RADIOACTIVE SEED LOCALIZATION OF THE RIGHT AXILLA MAMMOGRAPHIC GUIDED RADIOACTIVE SEED LOCALIZATION OF THE RIGHT BREAST COMPARISON:  Previous exam(s). FINDINGS: Patient presents for radioactive seed localization prior to . I met with the patient and we discussed the procedure of seed localization including benefits and alternatives. We discussed the high likelihood of a successful procedure. We discussed the risks of the procedure including infection, bleeding, tissue injury and further surgery. We discussed the low dose of radioactivity involved in the procedure. Informed, written consent was given. The usual time-out protocol was performed immediately prior to the procedure. Metastatic right axillary lymph node Using ultrasound guidance, sterile technique, 1% lidocaine and an I-125 radioactive seed, the metastatic right axillary lymph node and associated Tribell marker clip were localized using a inferior approach. The lymph node could not be visualized mammographically, but the seed was well seen deploying within the node on sonography. Follow-up survey of the patient confirms presence of the radioactive seed. Order number of I-125 seed:   [id number]. Total activity:  7.342 millicuries reference Date: 07/06/2021 Right breast carcinoma Using mammographic guidance, sterile technique, 1% lidocaine and an I-125 radioactive seed, the ribbon shaped biopsy clip was localized using a medial approach. The follow-up mammogram images confirm the seed in the expected location and were marked for Dr. Brantley Stage. Follow-up survey of the patient confirms the presence of a radioactive seed within the inferior breast. Order number of I-125 seed:  [id number]. Total activity:  6.203 millicuries reference Date: 06/13/2021 The patient tolerated the procedure well and was released from the [facility]. She was given instructions regarding seed removal. 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 No apparent complications. Electronically Signed   By: Lajean Manes M.D.   On: 07/10/2021 14:02  [place] RT RADIOACTIVE SEED LOC  Result Date: 07/10/2021 CLINICAL DATA:  Patient presents for localization of a right breast carcinoma and a metastatic right axillary lymph node prior to surgical excision. Breast lesion localized with mammographic guidance and axillary lymph node localized with sonographic guidance. EXAM: ULTRASOUND GUIDED RADIOACTIVE SEED LOCALIZATION OF THE RIGHT AXILLA MAMMOGRAPHIC GUIDED RADIOACTIVE SEED LOCALIZATION OF THE RIGHT BREAST COMPARISON:  Previous exam(s). FINDINGS: Patient presents for radioactive seed localization prior to . I met with the patient and we discussed the procedure of seed localization including benefits and alternatives. We discussed the high likelihood of a successful procedure. We discussed the risks of the procedure including infection, bleeding, tissue injury and further surgery. We discussed the low dose of radioactivity involved in the procedure. Informed, written consent was given. The usual time-out protocol was performed immediately prior to the procedure. Metastatic right axillary lymph node Using  ultrasound guidance, sterile technique, 1% lidocaine and an I-125 radioactive seed, the metastatic right axillary lymph node and associated Tribell marker clip were localized using a inferior approach. The lymph node could not be visualized mammographically, but the seed was well seen deploying within the node on sonography. Follow-up survey of  the patient confirms presence of the radioactive seed. Order number of I-125 seed:  [id number]. Total activity:  1.157 millicuries reference Date: 07/06/2021 Right breast carcinoma Using mammographic guidance, sterile technique, 1% lidocaine and an I-125 radioactive seed, the ribbon shaped biopsy clip was localized using a medial approach. The follow-up mammogram images confirm the seed in the expected location and were marked for Dr. Brantley Stage. Follow-up survey of the patient confirms the presence of a radioactive seed within the inferior breast. Order number of I-125 seed:  [id number]. Total activity:  4.163 millicuries reference Date: 06/13/2021 The patient tolerated the procedure well and was released from the [facility]. She was given instructions regarding seed removal. 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 No apparent complications. Electronically Signed   By: Lajean Manes M.D.   On: 07/10/2021 14:02    ELIGIBLE FOR AVAILABLE RESEARCH PROTOCOL: no  ASSESSMENT: 55 y.o. [place] woman status post right breast lower inner quadrant biopsy 01/06/2021 for a clinically T2 N1, stage IIIB functionally triple negative invasive ductal carcinoma, grade 3, with and MIB-1 of 90%  (a) chest CT scan 01/29/2021 shows no evidence of metastatic disease  (b) bone scan 02/01/2021 shows no evidence of metastatic disease  (1) neoadjuvant chemotherapy consisting of pembrolizumab given day 1 together with carboplatin and paclitaxel, with Taxol given on day 8 and 15, and filgrastim on days 16,17 and 18, started 02/07/2021, repeated every 21  days x 2, followed by doxorubicin and cyclophosphamide every 21 days x 4 also given with pembrolizumab on day 1 and PEG filgrastim day 3, started 03/22/2021 and completed 05/23/2021  (a) carboplatin/paclitaxel discontinued after the cycle 2-day 8 dose with neuropathy developing (subsequently denied by patient)   (2) pembrolizumab continuing to total 1 year  (3) right lumpectomy and sentinel lymph node sampling 07/11/2021  (4) adjuvant radiation  (5) genetics testing 01/31/2021 through the [facility] [employer] Panel found no deleterious mutations in AIP, ALK, APC, ATM, AXIN2, BAP1, BARD1, BLM, BMPR1A, BRCA1, BRCA2, BRIP1, CDC73, CDH1, CDK4, CDKN1B, CDKN2A, CHEK2, CTNNA1, DICER1, FANCC, FH, FLCN, GALNT12, KIF1B, LZTR1, MAX, MEN1, MET, MLH1, MSH2, MSH3, MSH6, MUTYH, NBN, NF1, NF2, NTHL1, PALB2, PHOX2B, PMS2, POT1, PRKAR1A, PTCH1, PTEN, RAD51C, RAD51D, RB1, RECQL, RET, SDHA, SDHAF2, SDHB, SDHC, SDHD, SMAD4, SMARCA4, SMARCB1, SMARCE1, STK11, SUFU, TMEM127, TP53, TSC1, TSC2, VHL and XRCC2 (sequencing and deletion/duplication); EGFR, EGLN1, HOXB13, KIT, MITF, PDGFRA, POLD1, and POLE (sequencing only); EPCAM and GREM1 (deletion/duplication only).   PLAN: Aivy is having her surgery today.  She will return to see me in 3 weeks.  We should have the final pathology then and will resume Herceptin at that time.   Virgie Dad. Azell Bill, MD 07/10/21 10:06 PM Medical Oncology and Hematology [facility] [place], [place] [address] Tel. [phone]    Fax. [phone]   I, Wilburn Mylar, am acting as scribe for Dr. Virgie Dad. Nolan Lasser.  I, Lurline Del MD, have reviewed the above documentation for accuracy and completeness, and I agree with the above.   *Total Encounter Time as defined by the Centers for Medicare and Medicaid Services includes, in addition to the face-to-face time of a patient visit (documented in the note above) non-face-to-face time:  obtaining and reviewing outside history, ordering and reviewing medications, tests or procedures, care coordination (communications with other health care professionals or caregivers) and documentation in the medical record.

## 2021-07-10 NOTE — Anesthesia Preprocedure Evaluation (Addendum)
Anesthesia Evaluation  Patient identified by MRN, date of birth, ID band Patient awake    Reviewed: Allergy & Precautions, NPO status , Patient's Chart, lab work & pertinent test results  Airway Mallampati: II  TM Distance: >3 FB Neck ROM: Full    Dental no notable dental hx. (+) Teeth Intact, Dental Advisory Given   Pulmonary neg pulmonary ROS,    Pulmonary exam normal breath sounds clear to auscultation       Cardiovascular hypertension, Pt. on medications Normal cardiovascular exam Rhythm:Regular Rate:Normal  06/09/2021 Echo 1. Left ventricular ejection fraction, by estimation, is 60 to 65%. Left  ventricular ejection fraction by PLAX is 65 %. The left ventricle has  normal function. The left ventricle has no regional wall motion  abnormalities. Left ventricular diastolic  parameters are consistent with Grade I diastolic dysfunction (impaired  relaxation). The average left ventricular global longitudinal strain is  -22.0 %. The global longitudinal strain is normal.  2. Right ventricular systolic function is normal. The right ventricular  size is normal.  3. The mitral valve is abnormal. Trivial mitral valve regurgitation.  4. The aortic valve is tricuspid. Aortic valve regurgitation is trivial.  No aortic stenosis is present.  5. The inferior vena cava is normal in size with greater than 50%  respiratory variability, suggesting right atrial pressure of 3 mmHg.  6. There is a small patent foramen ovale with predominantly left to right  shunting across the atrial septum.   Neuro/Psych negative neurological ROS  negative psych ROS   GI/Hepatic negative GI ROS, Lab Results      Component                Value               Date                      CREATININE               0.45                07/03/2021                BUN                      10                  07/03/2021                NA                       138                  07/03/2021                K                        3.8                 07/03/2021                CL                       100                 07/03/2021  CO2                      25                  07/03/2021              Endo/Other  diabetes, Type 2, Oral Hypoglycemic Agents  Renal/GU      Musculoskeletal  (+) Arthritis ,   Abdominal   Peds  Hematology Lab Results      Component                Value               Date                      WBC                      4.0                 07/03/2021                HGB                      12.5                07/03/2021                HCT                      39.0                07/03/2021                MCV                      87.4                07/03/2021                PLT                      258                 07/03/2021              Anesthesia Other Findings R Breast CA  All: lisinopril  Reproductive/Obstetrics                            Anesthesia Physical Anesthesia Plan  ASA: 3  Anesthesia Plan: General   Post-op Pain Management:    Induction: Intravenous  PONV Risk Score and Plan: 4 or greater and Treatment may vary due to age or medical condition, Ondansetron, Midazolam and Dexamethasone  Airway Management Planned: LMA  Additional Equipment: None  Intra-op Plan:   Post-operative Plan:   Informed Consent: I have reviewed the patients History and Physical, chart, labs and discussed the procedure including the risks, benefits and alternatives for the proposed anesthesia with the patient or authorized representative who has indicated his/her understanding and acceptance.     Dental advisory given  Plan Discussed with: CRNA and Anesthesiologist  Anesthesia Plan Comments: (R Lumpectomy w Pec bloc)       Anesthesia Quick Evaluation

## 2021-07-11 ENCOUNTER — Ambulatory Visit (HOSPITAL_BASED_OUTPATIENT_CLINIC_OR_DEPARTMENT_OTHER)
Admission: RE | Admit: 2021-07-11 | Discharge: 2021-07-11 | Disposition: A | Discharge status: Home / Self Care | Payer: 59 | Source: Ambulatory Visit | Attending: Surgery | Admitting: Surgery

## 2021-07-11 ENCOUNTER — Ambulatory Visit (HOSPITAL_BASED_OUTPATIENT_CLINIC_OR_DEPARTMENT_OTHER): Payer: 59 | Admitting: Anesthesiology

## 2021-07-11 ENCOUNTER — Inpatient Hospital Stay: Payer: 59

## 2021-07-11 ENCOUNTER — Encounter: Payer: 59 | Admitting: Dietician

## 2021-07-11 ENCOUNTER — Encounter (HOSPITAL_BASED_OUTPATIENT_CLINIC_OR_DEPARTMENT_OTHER): Payer: Self-pay | Admitting: Surgery

## 2021-07-11 ENCOUNTER — Ambulatory Visit
Admission: RE | Admit: 2021-07-11 | Discharge: 2021-07-11 | Disposition: A | Discharge status: Home / Self Care | Payer: 59 | Source: Ambulatory Visit | Attending: Surgery | Admitting: Surgery

## 2021-07-11 ENCOUNTER — Ambulatory Visit: Payer: 59

## 2021-07-11 ENCOUNTER — Other Ambulatory Visit: Payer: Self-pay

## 2021-07-11 ENCOUNTER — Inpatient Hospital Stay: Payer: 59 | Attending: Oncology | Admitting: Oncology

## 2021-07-11 ENCOUNTER — Ambulatory Visit
Admit: 2021-07-11 | Discharge: 2021-07-11 | Disposition: A | Discharge status: Home / Self Care | Payer: 59 | Attending: Surgery | Admitting: Surgery

## 2021-07-11 ENCOUNTER — Ambulatory Visit (HOSPITAL_COMMUNITY)
Admission: RE | Admit: 2021-07-11 | Discharge: 2021-07-11 | Disposition: A | Discharge status: Home / Self Care | Payer: 59 | Source: Ambulatory Visit | Attending: Surgery | Admitting: Surgery

## 2021-07-11 ENCOUNTER — Encounter (HOSPITAL_BASED_OUTPATIENT_CLINIC_OR_DEPARTMENT_OTHER)
Admission: RE | Disposition: A | Discharge status: Home / Self Care | Payer: Self-pay | Source: Ambulatory Visit | Attending: Surgery

## 2021-07-11 DIAGNOSIS — Z170 Estrogen receptor positive status [ER+]: Secondary | ICD-10-CM

## 2021-07-11 DIAGNOSIS — Z888 Allergy status to other drugs, medicaments and biological substances status: Secondary | ICD-10-CM | POA: Insufficient documentation

## 2021-07-11 DIAGNOSIS — D241 Benign neoplasm of right breast: Secondary | ICD-10-CM | POA: Insufficient documentation

## 2021-07-11 DIAGNOSIS — I898 Other specified noninfective disorders of lymphatic vessels and lymph nodes: Secondary | ICD-10-CM | POA: Insufficient documentation

## 2021-07-11 DIAGNOSIS — C50911 Malignant neoplasm of unspecified site of right female breast: Secondary | ICD-10-CM

## 2021-07-11 DIAGNOSIS — N6031 Fibrosclerosis of right breast: Secondary | ICD-10-CM | POA: Diagnosis not present

## 2021-07-11 DIAGNOSIS — C50311 Malignant neoplasm of lower-inner quadrant of right female breast: Secondary | ICD-10-CM

## 2021-07-11 HISTORY — PX: RADIOACTIVE SEED GUIDED AXILLARY SENTINEL LYMPH NODE: SHX6735

## 2021-07-11 HISTORY — PX: BREAST LUMPECTOMY WITH RADIOACTIVE SEED AND SENTINEL LYMPH NODE BIOPSY: SHX6550

## 2021-07-11 LAB — POCT PREGNANCY, URINE: Preg Test, Ur: NEGATIVE

## 2021-07-11 LAB — GLUCOSE, CAPILLARY
Glucose-Capillary: 179 mg/dL — ABNORMAL HIGH (ref 70–99)
Glucose-Capillary: 193 mg/dL — ABNORMAL HIGH (ref 70–99)

## 2021-07-11 IMAGING — MG MM BREAST SURGICAL SPECIMEN
1 series · 2 of 2 positions shown · non-contrast
Comparison: Previous exam(s).

CLINICAL DATA: Evaluate surgical specimens of the RIGHT breast and
axilla following lumpectomy for breast cancer and excision of
metastatic RIGHT axillary lymph node.

EXAM:
SPECIMEN RADIOGRAPH OF THE RIGHT BREAST
SPECIMEN RADIOGRAPH OF THE RIGHT AXILLA

[Series 1: R · right · 0.07mm/px · 2 of 2 slices shown]
[im 1/2]
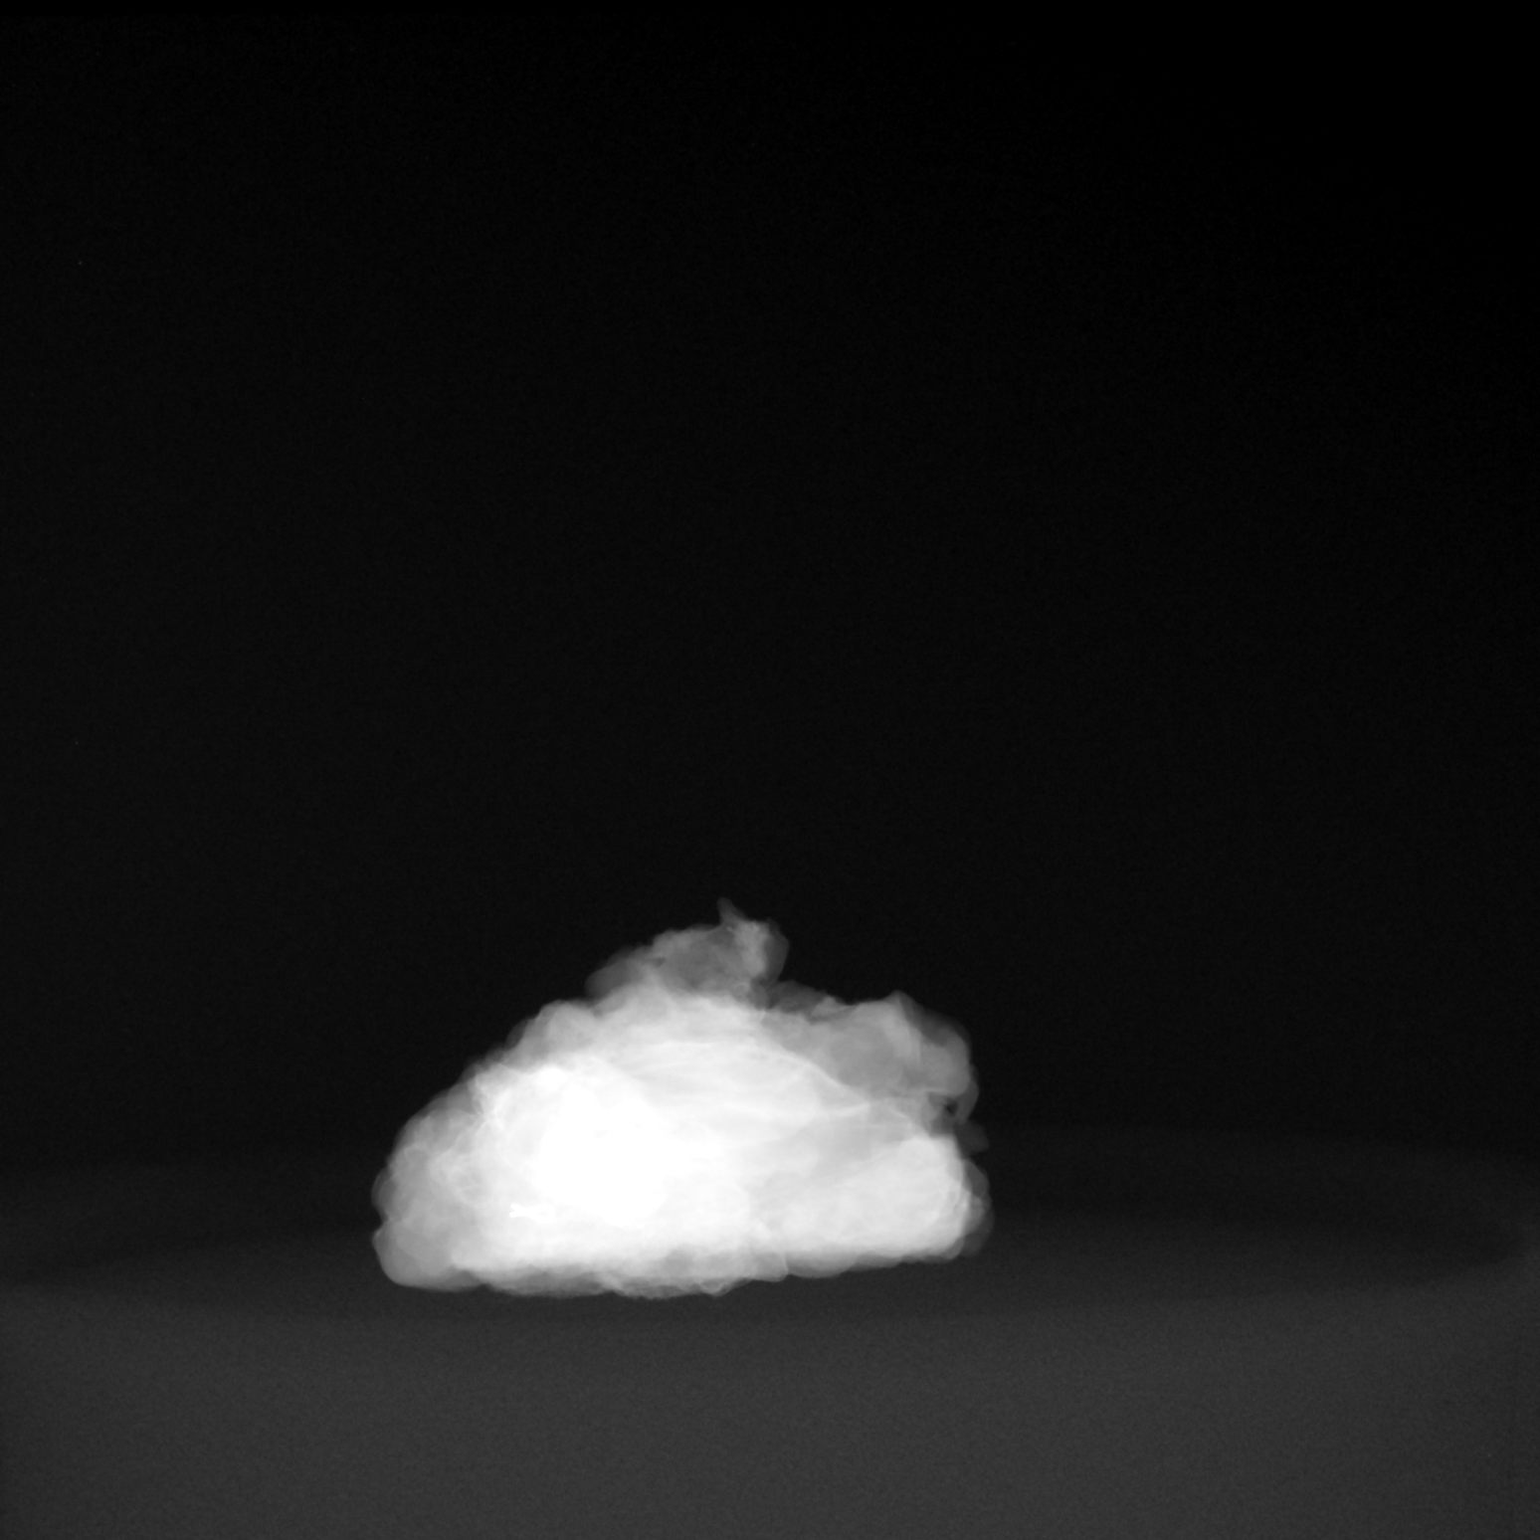
[im 2/2]
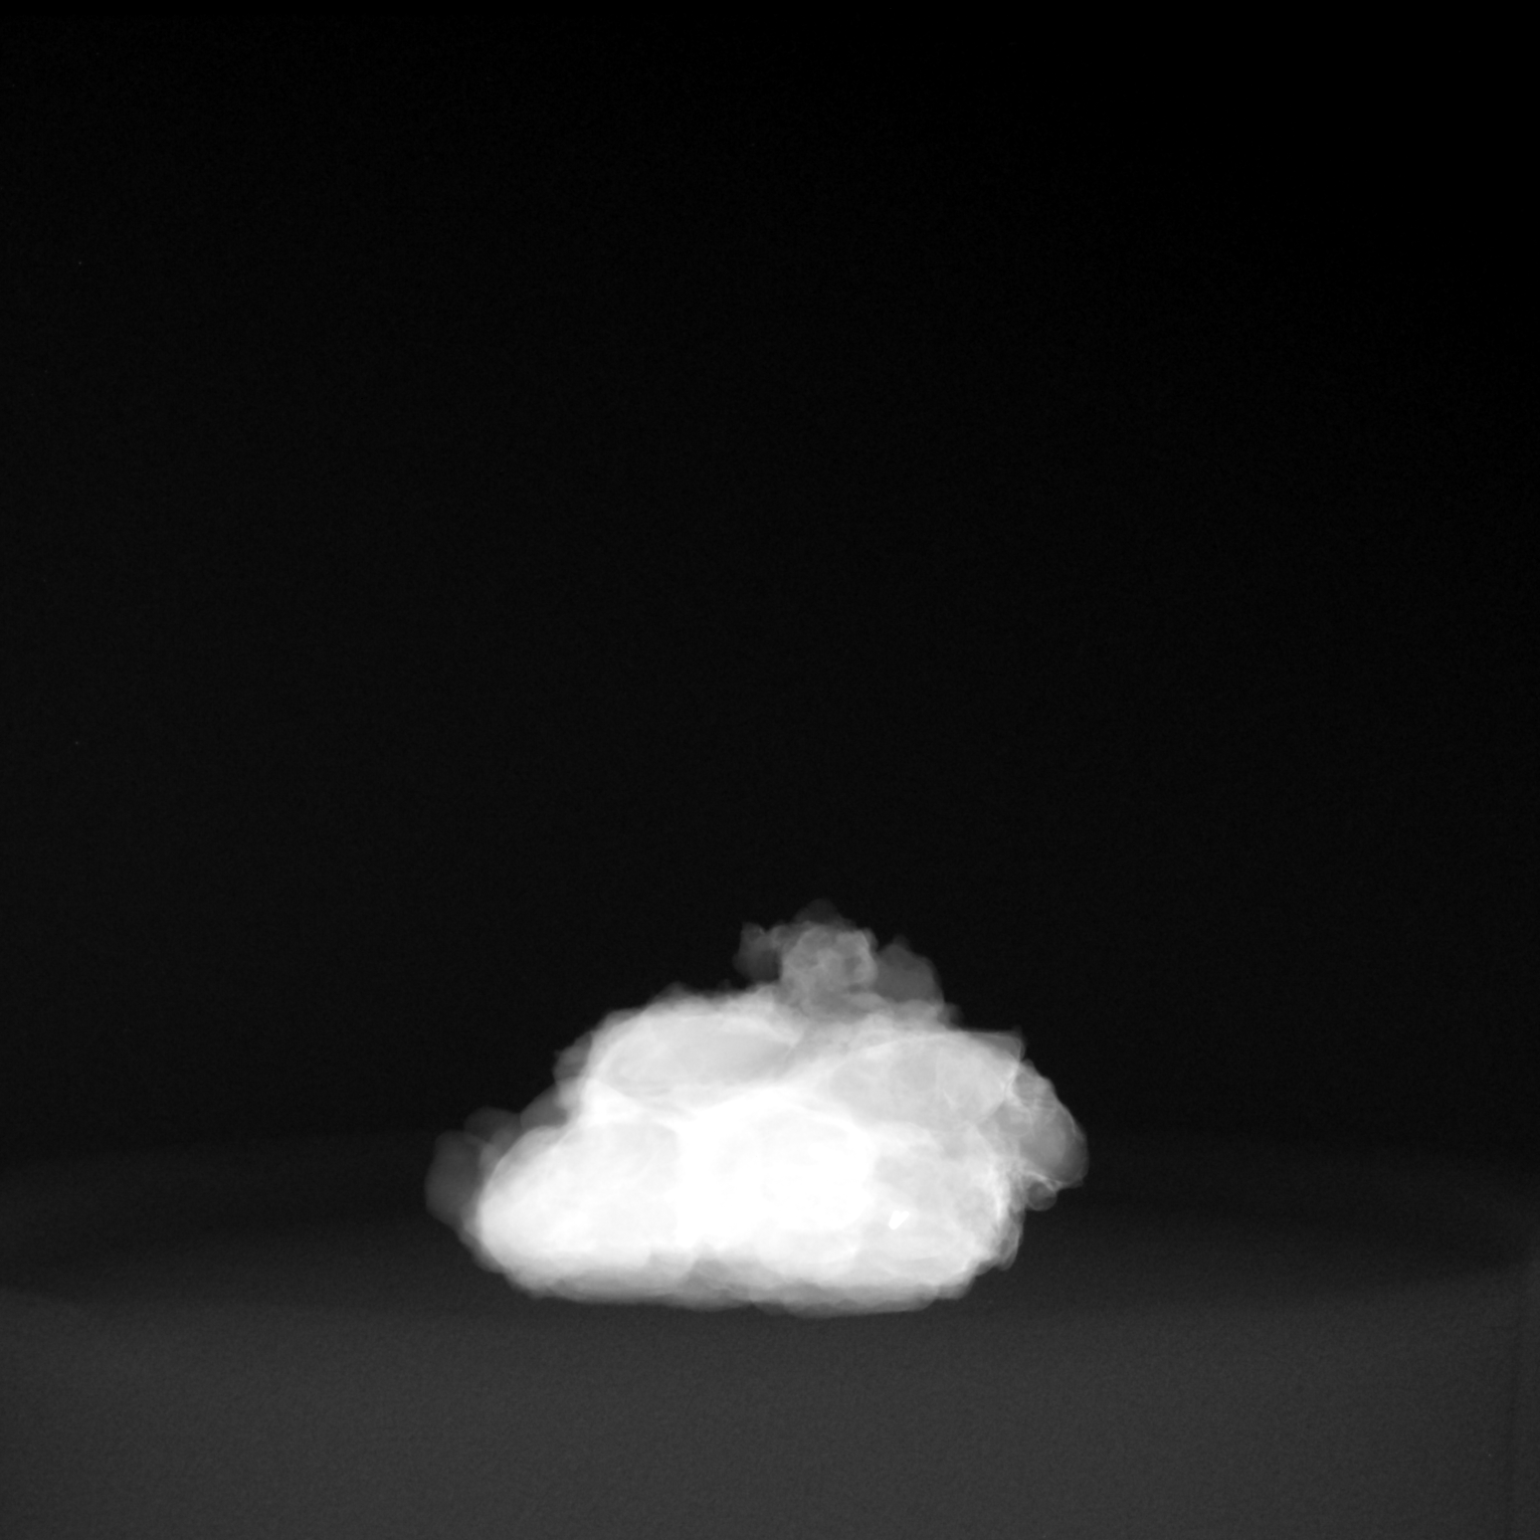

[2 of 2 positions shown; findings below may reference images not displayed]

FINDINGS: Status post excision of the RIGHT breast and RIGHT axilla.

The radioactive seed and RIBBON biopsy marker clip are present and
completely intact.

The radioactive seed and TRIBELL biopsy marker clip are present and
completely intact.
IMPRESSION: Specimen radiograph of the RIGHT breast and RIGHT axilla.

## 2021-07-11 IMAGING — DX MM BREAST SURGICAL SPECIMEN
1 series · 2 of 2 positions shown · non-contrast
Comparison: Previous exam(s).

CLINICAL DATA: Evaluate surgical specimens of the RIGHT breast and
axilla following lumpectomy for breast cancer and excision of
metastatic RIGHT axillary lymph node.

EXAM:
SPECIMEN RADIOGRAPH OF THE RIGHT BREAST
SPECIMEN RADIOGRAPH OF THE RIGHT AXILLA

[Series 1: specimen digital x-ray · right · 0.07mm/px · 2 of 2 slices shown]
[im 1/2]
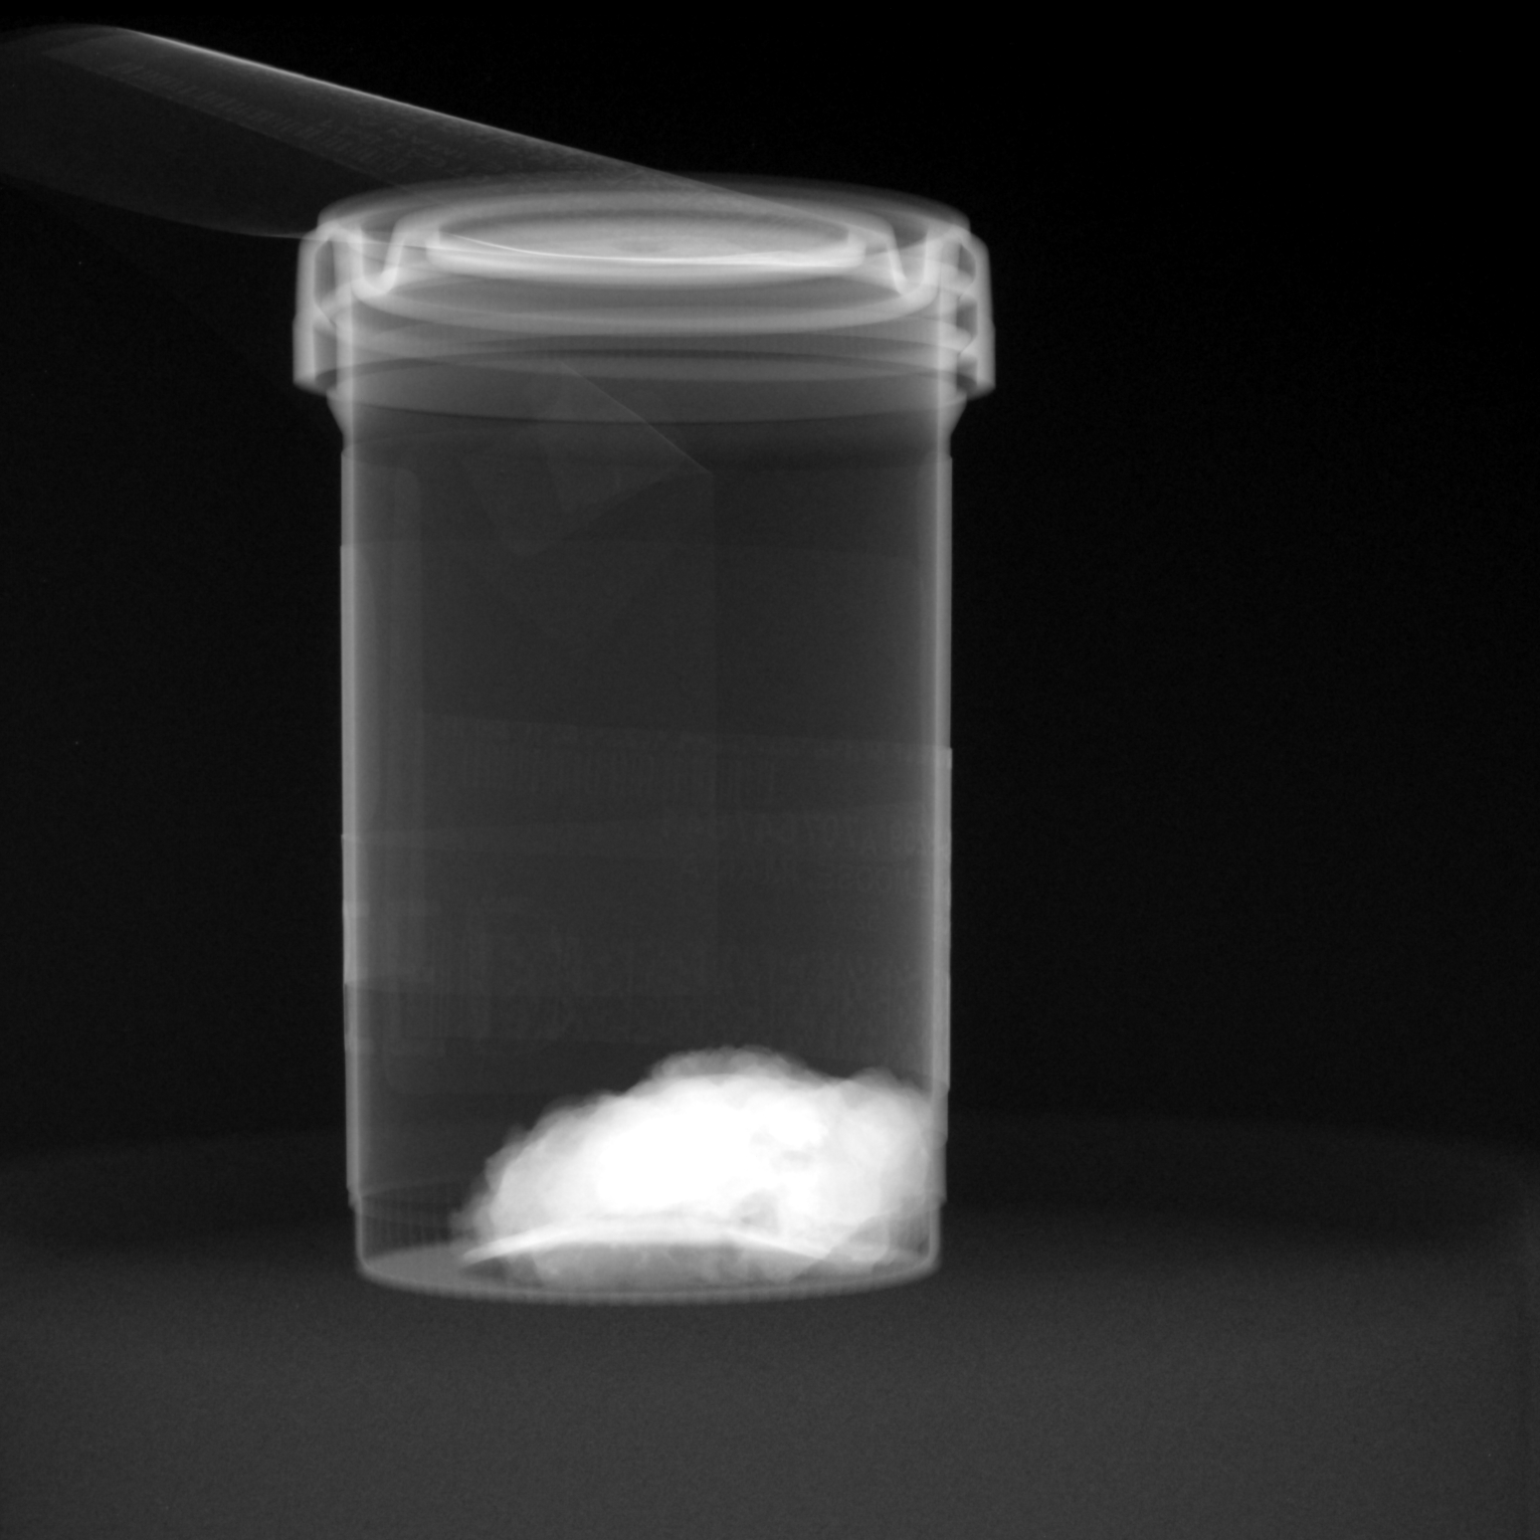
[im 2/2]
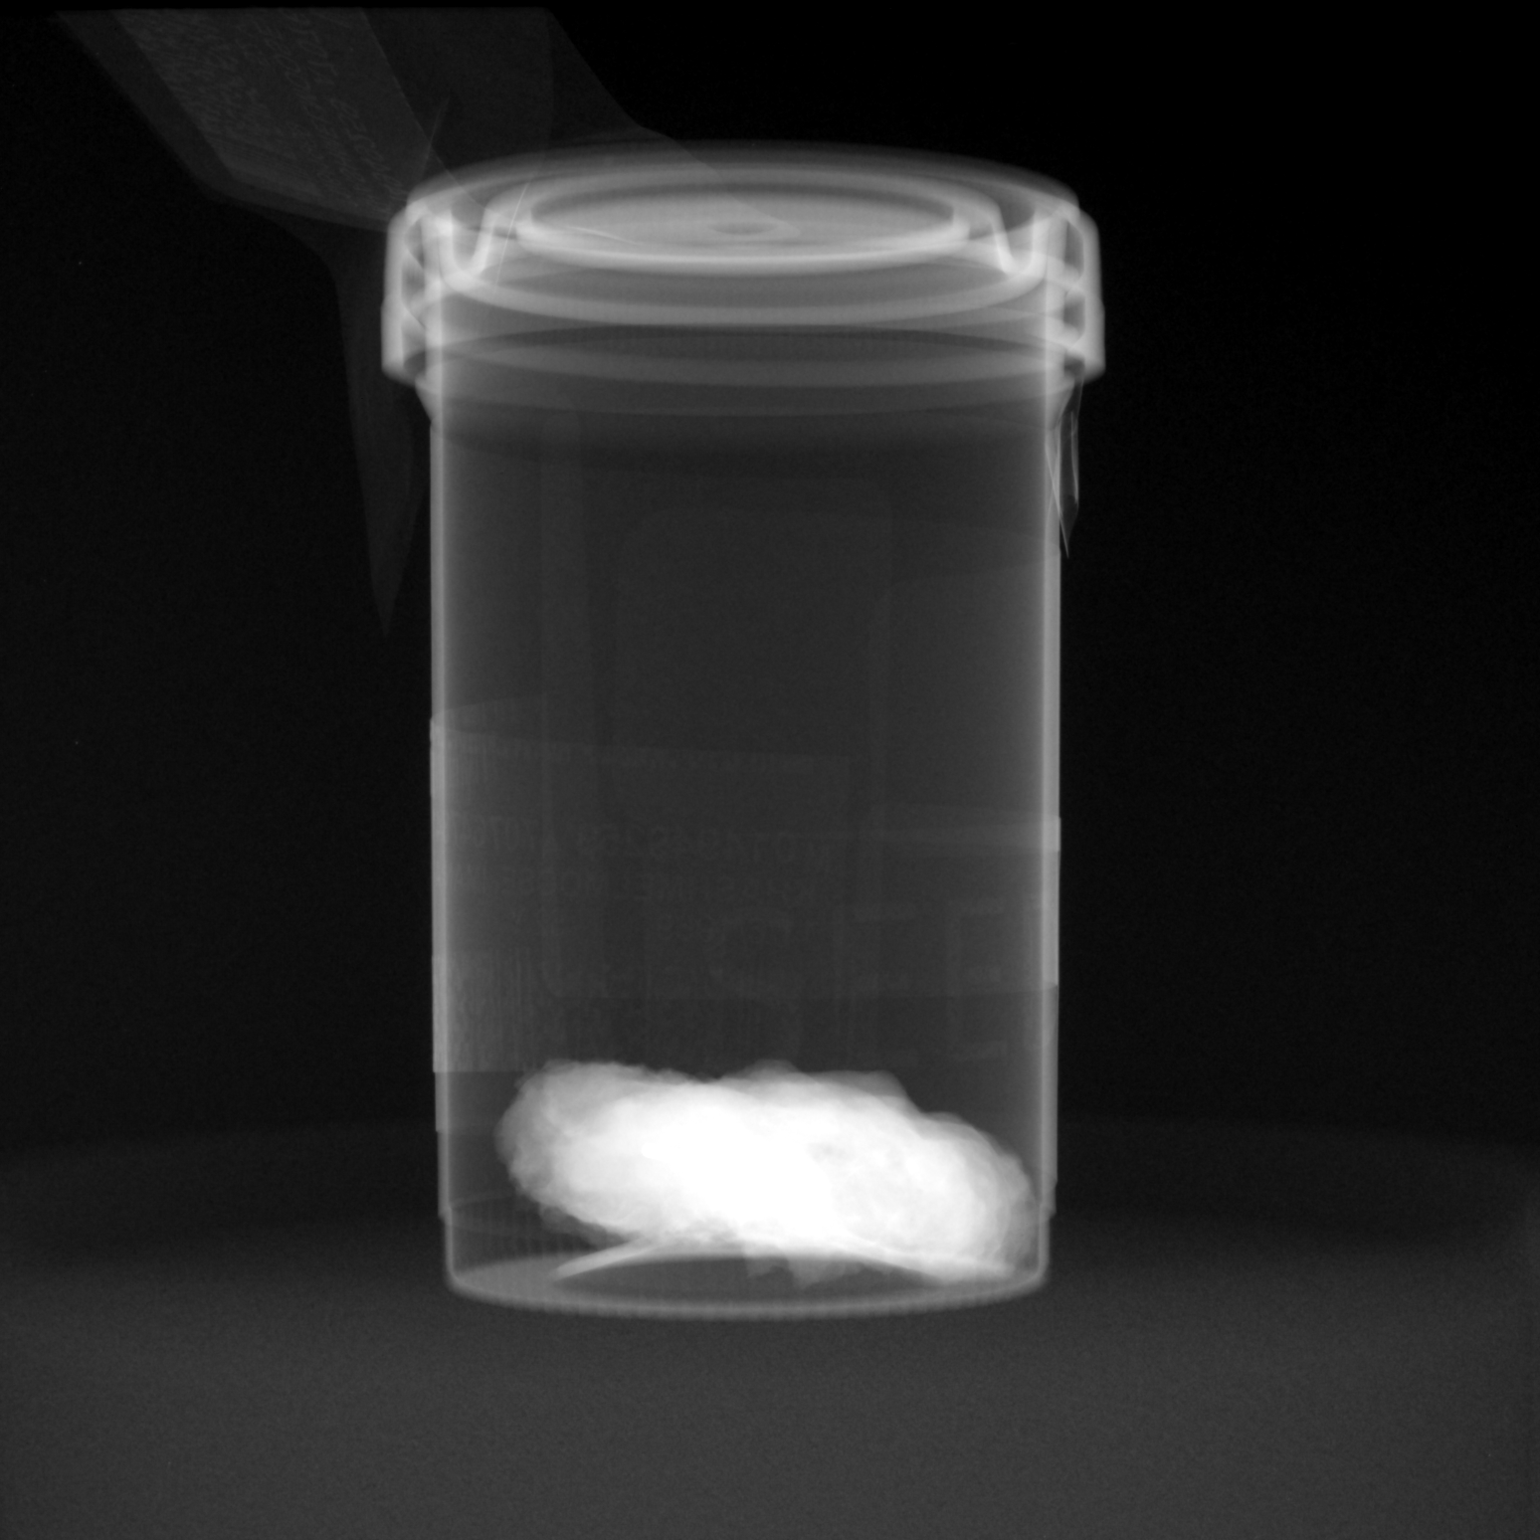

[2 of 2 positions shown; findings below may reference images not displayed]

FINDINGS: Status post excision of the RIGHT breast and RIGHT axilla.

The radioactive seed and RIBBON biopsy marker clip are present and
completely intact.

The radioactive seed and TRIBELL biopsy marker clip are present and
completely intact.
IMPRESSION: Specimen radiograph of the RIGHT breast and RIGHT axilla.

## 2021-07-11 SURGERY — BREAST LUMPECTOMY WITH RADIOACTIVE SEED AND SENTINEL LYMPH NODE BIOPSY
Anesthesia: General | Site: Breast | Laterality: Right

## 2021-07-11 MED ORDER — LIDOCAINE HCL (PF) 2 % IJ SOLN
INTRAMUSCULAR | Status: AC
  Filled 2021-07-11: qty 5

## 2021-07-11 MED ORDER — SODIUM CHLORIDE 0.9 % IV SOLN
INTRAVENOUS | Status: AC
  Filled 2021-07-11: qty 10

## 2021-07-11 MED ORDER — OXYCODONE HCL 5 MG/5ML PO SOLN: 5.0000 mg | Freq: Once | ORAL | Status: DC | PRN

## 2021-07-11 MED ORDER — HYDROMORPHONE HCL 1 MG/ML IJ SOLN
INTRAMUSCULAR | Status: AC
  Filled 2021-07-11: qty 0.5

## 2021-07-11 MED ORDER — VANCOMYCIN HCL 500 MG IV SOLR
INTRAVENOUS | Status: AC
  Filled 2021-07-11: qty 10

## 2021-07-11 MED ORDER — FENTANYL CITRATE (PF) 100 MCG/2ML IJ SOLN
INTRAMUSCULAR | Status: DC | PRN
  Administered 2021-07-11 (×4): 25 ug via INTRAVENOUS

## 2021-07-11 MED ORDER — SODIUM CHLORIDE 0.9 % IV SOLN
INTRAVENOUS | Status: DC | PRN
  Administered 2021-07-11: 500 mL

## 2021-07-11 MED ORDER — MAGTRACE LYMPHATIC TRACER
INTRAMUSCULAR | Status: DC | PRN
  Administered 2021-07-11: 2 mL via INTRAMUSCULAR

## 2021-07-11 MED ORDER — FENTANYL CITRATE (PF) 100 MCG/2ML IJ SOLN
INTRAMUSCULAR | Status: AC
  Filled 2021-07-11: qty 2

## 2021-07-11 MED ORDER — LACTATED RINGERS IV SOLN: INTRAVENOUS | Status: DC

## 2021-07-11 MED ORDER — HYDROCODONE-ACETAMINOPHEN 5-325 MG PO TABS: 1.0000 | ORAL_TABLET | Freq: Four times a day (QID) | ORAL | 0 refills | Status: DC | PRN

## 2021-07-11 MED ORDER — HYDROMORPHONE HCL 1 MG/ML IJ SOLN
0.2500 mg | INTRAMUSCULAR | Status: DC | PRN
  Administered 2021-07-11 (×2): 0.5 mg via INTRAVENOUS

## 2021-07-11 MED ORDER — MIDAZOLAM HCL 2 MG/2ML IJ SOLN
1.0000 mg | Freq: Once | INTRAMUSCULAR | Status: AC
  Administered 2021-07-11: 1 mg via INTRAVENOUS

## 2021-07-11 MED ORDER — ONDANSETRON HCL 4 MG/2ML IJ SOLN
INTRAMUSCULAR | Status: AC
  Filled 2021-07-11: qty 2

## 2021-07-11 MED ORDER — FENTANYL CITRATE (PF) 100 MCG/2ML IJ SOLN
50.0000 ug | Freq: Once | INTRAMUSCULAR | Status: AC
  Administered 2021-07-11: 50 ug via INTRAVENOUS

## 2021-07-11 MED ORDER — PROPOFOL 500 MG/50ML IV EMUL
INTRAVENOUS | Status: DC | PRN
  Administered 2021-07-11: 25 ug/kg/min via INTRAVENOUS

## 2021-07-11 MED ORDER — VANCOMYCIN HCL 500 MG IV SOLR
INTRAVENOUS | Status: DC | PRN
  Administered 2021-07-11: 500 mg via TOPICAL

## 2021-07-11 MED ORDER — CLONIDINE HCL (ANALGESIA) 100 MCG/ML EP SOLN
EPIDURAL | Status: DC | PRN
  Administered 2021-07-11: 100 ug

## 2021-07-11 MED ORDER — PROPOFOL 10 MG/ML IV BOLUS
INTRAVENOUS | Status: DC | PRN
  Administered 2021-07-11: 80 mg via INTRAVENOUS

## 2021-07-11 MED ORDER — CEFAZOLIN SODIUM-DEXTROSE 2-4 GM/100ML-% IV SOLN
2.0000 g | INTRAVENOUS | Status: AC
  Administered 2021-07-11: 2 g via INTRAVENOUS

## 2021-07-11 MED ORDER — BUPIVACAINE-EPINEPHRINE (PF) 0.25% -1:200000 IJ SOLN
INTRAMUSCULAR | Status: DC | PRN
  Administered 2021-07-11: 15 mL

## 2021-07-11 MED ORDER — ACETAMINOPHEN 10 MG/ML IV SOLN: 1000.0000 mg | Freq: Once | INTRAVENOUS | Status: DC | PRN

## 2021-07-11 MED ORDER — LIDOCAINE HCL (CARDIAC) PF 100 MG/5ML IV SOSY
PREFILLED_SYRINGE | INTRAVENOUS | Status: DC | PRN
  Administered 2021-07-11: 80 mg via INTRAVENOUS

## 2021-07-11 MED ORDER — IBUPROFEN 800 MG PO TABS: 800.0000 mg | ORAL_TABLET | Freq: Three times a day (TID) | ORAL | 0 refills | Status: DC | PRN

## 2021-07-11 MED ORDER — ROPIVACAINE HCL 5 MG/ML IJ SOLN
INTRAMUSCULAR | Status: DC | PRN
  Administered 2021-07-11: 30 mL

## 2021-07-11 MED ORDER — PROPOFOL 500 MG/50ML IV EMUL
INTRAVENOUS | Status: AC
  Filled 2021-07-11: qty 50

## 2021-07-11 MED ORDER — CHLORHEXIDINE GLUCONATE CLOTH 2 % EX PADS: 6.0000 | MEDICATED_PAD | Freq: Once | CUTANEOUS | Status: DC

## 2021-07-11 MED ORDER — CEFAZOLIN SODIUM-DEXTROSE 2-4 GM/100ML-% IV SOLN
INTRAVENOUS | Status: AC
  Filled 2021-07-11: qty 100

## 2021-07-11 MED ORDER — DEXAMETHASONE SODIUM PHOSPHATE 4 MG/ML IJ SOLN
INTRAMUSCULAR | Status: DC | PRN
  Administered 2021-07-11: 5 mg via INTRAVENOUS

## 2021-07-11 MED ORDER — TECHNETIUM TC 99M TILMANOCEPT KIT
1.0000 | PACK | Freq: Once | INTRAVENOUS | Status: AC | PRN
  Administered 2021-07-11: 1 via INTRADERMAL

## 2021-07-11 MED ORDER — ONDANSETRON HCL 4 MG/2ML IJ SOLN: 4.0000 mg | Freq: Once | INTRAMUSCULAR | Status: DC | PRN

## 2021-07-11 MED ORDER — AMISULPRIDE (ANTIEMETIC) 5 MG/2ML IV SOLN: 10.0000 mg | Freq: Once | INTRAVENOUS | Status: DC | PRN

## 2021-07-11 MED ORDER — ONDANSETRON HCL 4 MG/2ML IJ SOLN
INTRAMUSCULAR | Status: DC | PRN
  Administered 2021-07-11: 4 mg via INTRAVENOUS

## 2021-07-11 MED ORDER — MIDAZOLAM HCL 2 MG/2ML IJ SOLN
INTRAMUSCULAR | Status: AC
  Filled 2021-07-11: qty 2

## 2021-07-11 MED ORDER — OXYCODONE HCL 5 MG PO TABS: 5.0000 mg | ORAL_TABLET | Freq: Once | ORAL | Status: DC | PRN

## 2021-07-11 MED ORDER — DEXAMETHASONE SODIUM PHOSPHATE 10 MG/ML IJ SOLN
INTRAMUSCULAR | Status: AC
  Filled 2021-07-11: qty 1

## 2021-07-11 SURGICAL SUPPLY — 47 items
ADH SKN CLS APL DERMABOND .7 (GAUZE/BANDAGES/DRESSINGS) ×1
APL PRP STRL LF DISP 70% ISPRP (MISCELLANEOUS) ×1
APPLIER CLIP 9.375 MED OPEN (MISCELLANEOUS) ×2
APR CLP MED 9.3 20 MLT OPN (MISCELLANEOUS) ×1
BINDER BREAST XLRG (GAUZE/BANDAGES/DRESSINGS) ×1 IMPLANT
BLADE SURG 15 STRL LF DISP TIS (BLADE) ×1 IMPLANT
BLADE SURG 15 STRL SS (BLADE) ×2
CANISTER SUCT 1200ML W/VALVE (MISCELLANEOUS) ×2 IMPLANT
CHLORAPREP W/TINT 26 (MISCELLANEOUS) ×2 IMPLANT
CLIP APPLIE 9.375 MED OPEN (MISCELLANEOUS) ×1 IMPLANT
COVER BACK TABLE 60X90IN (DRAPES) ×2 IMPLANT
COVER MAYO STAND STRL (DRAPES) ×2 IMPLANT
COVER PROBE W GEL 5X96 (DRAPES) ×3 IMPLANT
DERMABOND ADVANCED (GAUZE/BANDAGES/DRESSINGS) ×1
DERMABOND ADVANCED .7 DNX12 (GAUZE/BANDAGES/DRESSINGS) ×1 IMPLANT
DRAPE LAPAROSCOPIC ABDOMINAL (DRAPES) ×2 IMPLANT
DRAPE UTILITY XL STRL (DRAPES) ×2 IMPLANT
ELECT COATED BLADE 2.86 ST (ELECTRODE) ×2 IMPLANT
ELECT REM PT RETURN 9FT ADLT (ELECTROSURGICAL) ×2
ELECTRODE REM PT RTRN 9FT ADLT (ELECTROSURGICAL) ×1 IMPLANT
GLOVE SRG 8 PF TXTR STRL LF DI (GLOVE) ×1 IMPLANT
GLOVE SURG LTX SZ8 (GLOVE) ×2 IMPLANT
GLOVE SURG UNDER POLY LF SZ7 (GLOVE) ×1 IMPLANT
GLOVE SURG UNDER POLY LF SZ8 (GLOVE) ×2
GOWN STRL REUS W/ TWL LRG LVL3 (GOWN DISPOSABLE) ×2 IMPLANT
GOWN STRL REUS W/ TWL XL LVL3 (GOWN DISPOSABLE) ×1 IMPLANT
GOWN STRL REUS W/TWL LRG LVL3 (GOWN DISPOSABLE) ×4
GOWN STRL REUS W/TWL XL LVL3 (GOWN DISPOSABLE) ×2
HEMOSTAT ARISTA ABSORB 3G PWDR (HEMOSTASIS) ×1 IMPLANT
KIT MARKER MARGIN INK (KITS) ×2 IMPLANT
NDL HYPO 25X1 1.5 SAFETY (NEEDLE) ×1 IMPLANT
NDL SAFETY ECLIPSE 18X1.5 (NEEDLE) IMPLANT
NEEDLE HYPO 18GX1.5 SHARP (NEEDLE) ×2
NEEDLE HYPO 25X1 1.5 SAFETY (NEEDLE) ×4 IMPLANT
NS IRRIG 1000ML POUR BTL (IV SOLUTION) ×2 IMPLANT
PACK BASIN DAY SURGERY FS (CUSTOM PROCEDURE TRAY) ×2 IMPLANT
PENCIL SMOKE EVACUATOR (MISCELLANEOUS) ×2 IMPLANT
SLEEVE SCD COMPRESS KNEE MED (STOCKING) ×2 IMPLANT
SPONGE T-LAP 4X18 ~~LOC~~+RFID (SPONGE) ×3 IMPLANT
SUT MNCRL AB 4-0 PS2 18 (SUTURE) ×3 IMPLANT
SUT VICRYL 3-0 CR8 SH (SUTURE) ×2 IMPLANT
SYR CONTROL 10ML LL (SYRINGE) ×3 IMPLANT
TOWEL GREEN STERILE FF (TOWEL DISPOSABLE) ×2 IMPLANT
TRACER MAGTRACE VIAL (MISCELLANEOUS) ×1 IMPLANT
TRAY FAXITRON CT DISP (TRAY / TRAY PROCEDURE) ×2 IMPLANT
TUBE CONNECTING 20X1/4 (TUBING) ×2 IMPLANT
YANKAUER SUCT BULB TIP NO VENT (SUCTIONS) ×2 IMPLANT

## 2021-07-11 NOTE — Anesthesia Procedure Notes (Addendum)
Anesthesia Regional Block: Pectoralis block   Pre-Anesthetic Checklist: , timeout performed,  Correct Patient, Correct Site, Correct Laterality,  Correct Procedure, Correct Position, site marked,  Risks and benefits discussed,  Surgical consent,  Pre-op evaluation,  At surgeon's request and post-op pain management  Laterality: Right and Upper  Prep: chloraprep       Needles:  Injection technique: Single-shot  Needle Type: Echogenic Needle     Needle Length: 9cm  Needle Gauge: 21     Additional Needles:   Procedures:,,,, ultrasound used (permanent image in chart),,    Narrative:  Start time: 07/11/2021 7:09 AM End time: 07/11/2021 7:18 AM Injection made incrementally with aspirations every 5 mL.  Performed by: Personally  Anesthesiologist: Barnet Glasgow, MD  Additional Notes: Block assessed. Patient tolerated procedure well.

## 2021-07-11 NOTE — Progress Notes (Signed)
Assisted Dr. Houser with right, ultrasound guided, pectoralis block. Side rails up, monitors on throughout procedure. See vital signs in flow sheet. Tolerated Procedure well. 

## 2021-07-11 NOTE — Discharge Instructions (Addendum)
[facility] Office Phone Number [phone]  BREAST BIOPSY/ PARTIAL MASTECTOMY: POST OP INSTRUCTIONS  Always review your discharge instruction sheet given to you by the facility where your surgery was performed.  IF YOU HAVE DISABILITY OR FAMILY LEAVE FORMS, YOU MUST BRING THEM TO THE OFFICE FOR PROCESSING.  DO NOT GIVE THEM TO YOUR DOCTOR.  A prescription for pain medication may be given to you upon discharge.  Take your pain medication as prescribed, if needed.  If narcotic pain medicine is not needed, then you may take acetaminophen (Tylenol) or ibuprofen (Advil) as needed. Take your usually prescribed medications unless otherwise directed If you need a refill on your pain medication, please contact your pharmacy.  They will contact our office to request authorization.  Prescriptions will not be filled after 5pm or on week-ends. You should eat very light the first 24 hours after surgery, such as soup, crackers, pudding, etc.  Resume your normal diet the day after surgery. Most patients will experience some swelling and bruising in the breast.  Ice packs and a good support bra will help.  Swelling and bruising can take several days to resolve.  It is common to experience some constipation if taking pain medication after surgery.  Increasing fluid intake and taking a stool softener will usually help or prevent this problem from occurring.  A mild laxative (Milk of Magnesia or Miralax) should be taken according to package directions if there are no bowel movements after 48 hours. Unless discharge instructions indicate otherwise, you may remove your bandages 24-48 hours after surgery, and you may shower at that time.  You may have steri-strips (small skin tapes) in place directly over the incision.  These strips should be left on the skin for 7-10 days.  If your surgeon used skin glue on the incision, you may shower in 24 hours.  The glue will flake off over the next 2-3 weeks.  Any  sutures or staples will be removed at the office during your follow-up visit. ACTIVITIES:  You may resume regular daily activities (gradually increasing) beginning the next day.  Wearing a good support bra or sports bra minimizes pain and swelling.  You may have sexual intercourse when it is comfortable. You may drive when you no longer are taking prescription pain medication, you can comfortably wear a seatbelt, and you can safely maneuver your car and apply brakes. RETURN TO WORK:  ______________________________________________________________________________________ You should see your doctor in the office for a follow-up appointment approximately two weeks after your surgery.  Your doctor's nurse will typically make your follow-up appointment when she calls you with your pathology report.  Expect your pathology report 2-3 business days after your surgery.  You may call to check if you do not hear from us after three days. OTHER INSTRUCTIONS: _______________________________________________________________________________________________ _____________________________________________________________________________________________________________________________________ _____________________________________________________________________________________________________________________________________ _____________________________________________________________________________________________________________________________________  WHEN TO CALL YOUR DOCTOR: Fever over 101.0 Nausea and/or vomiting. Extreme swelling or bruising. Continued bleeding from incision. Increased pain, redness, or drainage from the incision.  The clinic staff is available to answer your questions during regular business hours.  Please don't hesitate to call and ask to speak to one of the nurses for clinical concerns.  If you have a medical emergency, go to the nearest emergency room or call 911.  A surgeon from [facility] is always on call at the hospital.  For further questions, please visit [email or web address]    Post Anesthesia Home Care Instructions  Activity: Get plenty of rest for the remainder of   the day. A responsible individual must stay with you for 24 hours following the procedure.  For the next 24 hours, DO NOT: -Drive a car -Operate machinery -Drink alcoholic beverages -Take any medication unless instructed by your physician -Make any legal decisions or sign important papers.  Meals: Start with liquid foods such as gelatin or soup. Progress to regular foods as tolerated. Avoid greasy, spicy, heavy foods. If nausea and/or vomiting occur, drink only clear liquids until the nausea and/or vomiting subsides. Call your physician if vomiting continues.  Special Instructions/Symptoms: Your throat may feel dry or sore from the anesthesia or the breathing tube placed in your throat during surgery. If this causes discomfort, gargle with warm salt water. The discomfort should disappear within 24 hours.  If you had a scopolamine patch placed behind your ear for the management of post- operative nausea and/or vomiting:  1. The medication in the patch is effective for 72 hours, after which it should be removed.  Wrap patch in a tissue and discard in the trash. Wash hands thoroughly with soap and water. 2. You may remove the patch earlier than 72 hours if you experience unpleasant side effects which may include dry mouth, dizziness or visual disturbances. 3. Avoid touching the patch. Wash your hands with soap and water after contact with the patch.      

## 2021-07-11 NOTE — Transfer of Care (Signed)
Immediate Anesthesia Transfer of Care Note  Patient: Rebecca [name]  Procedure(s) Performed: RIGHT BREAST LUMPECTOMY WITH RADIOACTIVE SEED AND RIGHT SENTINEL LYMPH NODE BIOPSY (Right: Breast) RADIOACTIVE SEED GUIDED RIGHT AXILLARY LYMPH NODE BIOPSY (Right: Breast)  Patient Location: PACU  Anesthesia Type:GA combined with regional for post-op pain  Level of Consciousness: sedated  Airway & Oxygen Therapy: Patient Spontanous Breathing and Patient connected to face mask oxygen  Post-op Assessment: Report given to RN and Post -op Vital signs reviewed and stable  Post vital signs: Reviewed and stable  Last Vitals:  Vitals Value Taken Time  BP 90/56 07/11/21 0903  Temp    Pulse 83 07/11/21 0906  Resp 19 07/11/21 0906  SpO2 99 % 07/11/21 0906  Vitals shown include unvalidated device data.  Last Pain:  Vitals:   07/11/21 0637  TempSrc: Oral  PainSc: 0-No pain      Patients Stated Pain Goal: 5 (16/42/90 3795)  Complications: No notable events documented.

## 2021-07-11 NOTE — H&P (Signed)
Chief Complaint: New Patient   History of Present Illness: Rebecca [name] is a 55 y.o. 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 She feels well and desires breast conservation. Fish farm manager used to communicate. She has tolerated treatment. We discussed breast conserving treatment and mastectomy. Recurrence rates were discussed , survival and long term expectations.   Review of Systems: A complete review of systems was obtained from the patient. I have reviewed this information and discussed as appropriate with the patient. See HPI as well for other ROS.    Medical History: No past medical history on file.  There is no problem list on file for this patient.  No past surgical history on file.   Allergies  Allergen Reactions   Lisinopril Swelling  Swelling in throat, coughing, difficulty swallowing, could not sleep. Occurred Dec 2020.   No current outpatient medications on file prior to visit.   No current facility-administered medications on file prior to visit.   No family history on file.   Social History   Tobacco Use  Smoking Status Not on file  Smokeless Tobacco Not on file    Social History   Socioeconomic History   Marital status: Divorced   Objective:   Vitals:  06/19/21 0953  BP: (!) 130/92  Pulse: (!) 115  Temp: 36.7 C (98.1 F)  SpO2: 99%  Weight: 65.1 kg (143 lb 9.6 oz)  Height: 167.6 cm (5\' 6" )   Body mass index is 23.18 kg/m.  Physical Exam Exam conducted with a chaperone present.  Constitutional:  Appearance: Normal appearance.  HENT:  Head: Normocephalic.  Nose: Nose normal.  Eyes:  Pupils: Pupils are equal, round, and reactive to light.  Cardiovascular:  Rate and Rhythm: Normal rate and regular rhythm.  Pulmonary:  Effort: Pulmonary effort is normal.  Breath  sounds: No stridor.  Comments: Port in place right  Chest:  Breasts:  Right: Normal.  Left: Normal.   Lymphadenopathy:  Upper Body:  Right upper body: No axillary adenopathy.  Left upper body: No axillary adenopathy.  Neurological:  General: No focal deficit present.  Mental Status: She is alert.  Psychiatric:  Mood and Affect: Mood normal.     Labs, Imaging and Diagnostic Testing:  CLINICAL DATA: 55 year old female status post neoadjuvant therapy for right breast cancer.   LABS: None performed on site.   EXAM: BILATERAL BREAST MRI WITH AND WITHOUT CONTRAST   TECHNIQUE: Multiplanar, multisequence MR images of both breasts were obtained prior to and following the intravenous administration of 7 ml of Gadavist.   Three-dimensional MR images were rendered by post-processing of the original MR data on an independent workstation. The three-dimensional MR images were interpreted, and findings are reported in the following complete MRI report for this study. Three dimensional images were evaluated at the independent interpreting workstation using the DynaCAD thin client.   COMPARISON: Previous exam(s).   FINDINGS: Breast composition: b. Scattered fibroglandular tissue.   Background parenchymal enhancement: Minimal.   Right breast: There has been complete interval resolution of the previously identified, adjacent enhancing masses in the lower right breast consistent with the patient's biopsy-proven malignancy. Susceptibility artifact from post biopsy marking clip remains in that location. No suspicious mass or abnormal enhancement in the remainder of the right breast.   Left breast: No suspicious mass or abnormal enhancement.   Lymph nodes:  Marked interval improvement in size and enhancement of the previously enlarged right axillary lymph node. The lymph node now measures approximately 1 cm in long axis dimension (previously will 2.8 cm). No other suspicious  axillary or internal mammary chain lymphadenopathy.   Ancillary findings: None.   IMPRESSION: 1. Excellent response to chemotherapy with complete interval imaging resolution of the patient's known right breast malignancy. 2. Marked interval improvement in size and enhancement of the previously enlarged right axillary lymph node. 3. No MRI evidence of malignancy on the left.   RECOMMENDATION: Per clinical treatment plan.   BI-RADS CATEGORY 6: Known biopsy-proven malignancy.     Electronically Signed By: Rebecca [name] M.D. On: 05/25/2021 13:39  Assessment and Plan:  There are no diagnoses linked to this encounter.   Stage 2 right breast cancer ER POS PR NEG HER 2 NEU NEG  COMPLETE RESPONSE ON MRI  Discussed mastectomy , lumpectomy and targeted lymph node bioipsy and SLN mapping   Survival with each reviewed , complication, recurrence rates and post op care of each discussed   She would like to proceed with right breast lumpectomy, Right SLN mapping and targeted right lymph node biopsy.  Lymphedema discussed.   Risk of bleeding infection, recurrence, nerve injury, blood vessel injury, DVT, death, cosmesis , other treatments if necessary and death which is extremely rare.  No follow-ups on file.  Kennieth Francois, MD   I had direct face-to-face contact with the patient for a total of 30 minutes and greater than 50% of that time was spent providing counseling and/or coordination of care for the patient regardingsurgery.

## 2021-07-11 NOTE — Op Note (Signed)
Preoperative diagnosis: Stage II right breast cancer lower right breast overlapping sites  Postoperative diagnosis: Same  Procedure: Right breast seed localized lumpectomy, right axillary lymph node biopsy with seed localization, right axillary sentinel lymph node mapping using mag trace and Lymphoseek  Surgeon: Erroll Luna, MD  Anesthesia: General with pectoral block and 0.25% Marcaine plain  EBL: 20 cc  Specimen: Right breast tissue with seed and clip verified by Faxitron, right axillar lymph node with seed and clip verified by Faxitron and 2 additional sentinel nodes.  Of note all 3 nodes mapped out a sentinel nodes.  Drains: None  IV fluids: Per anesthesia record  Indications for procedure: Patient is a 55 year old female with stage II right breast cancer.  She went neoadjuvant chemotherapy.  She had Response and chose breast conserving surgery.  Of note interpreter used to communicate with her.The procedure has been discussed with the patient. Alternatives to surgery have been discussed with the patient.  Risks of surgery include bleeding,  Infection,  Seroma formation, death,  and the need for further surgery.   The patient understands and wishes to proceed. Sentinel lymph node mapping and dissection has been discussed with the patient.  Risk of bleeding,  Infection,  Seroma formation, lymphedema additional procedures,,  Shoulder weakness ,  Shoulder stiffness,  Nerve and blood vessel injury and reaction to the mapping dyes have been discussed.  Alternatives to surgery have been discussed with the patient.  The patient agrees to proceed.   Description of procedure: The patient was met in the holding area and questions were answered.  Neoprobe used to identify the seed both in axilla and right breast.  All questions were answered.  She underwent a pectoral block as well.  She underwent injection of Lymphoseek by radiology technician.  She was then taken back to the operative room.  She  is placed supine upon the OR table.  After induction of general anesthesia, right breast was prepped and draped in a sterile fashion and timeout performed.  Proper patient, site and procedure were verified.  Under sterile conditions 2 cc of mag trace were injected in a subareolar position and massaged for 5 minutes.  We then used the neoprobe to identify the seed in the breast.  It was in the lower part of the breast overlapping quadrants.  Inframammary incision was made.  Dissection was carried down all tissue and the seed and clip were excised with a grossly negative margin.  The Faxitron image revealed both seed and clip to be in specimen.  Cavity was inspected hemostasis and made hemostatic with cautery.  I did shave all margins.  These were sent separately.  Of note all specimen was inked for orientation purposes.  Clips were placed in the cavity.  Vancomycin powder placed.  Cavity closed with 3-0 Vicryl and 4 Monocryl.  Neoprobe used to the right axilla.  The seed was then defined in the right axilla.  Incision was made in the right axillary crease.  Dissection was carried down into the level 1 contents and the node was identified the had the seed in it.  This was removed.  We then checked the node for sentinel lymph node activity new sentinel node as well.  The Faxitron image revealed both the seed and clip to be present in the node.  We then used the symptomatic to map out additional nodes which were 2 more.  Background counts approached baseline after removal of these 2 additional nodes for total of 3.  The long thoracic nerve, thoracodorsal trunk and axillary vein were all preserved.  Hemostasis achieved with cautery and Arista.  Of note irrigation was used prior to application of Arista.  Vancomycin powder placed.  Wound closed with 3-0 Vicryl and 4 Monocryl.  Dermabond applied.  All counts found to be correct.  The patient was awoke extubated taken to recovery in satisfactory condition.

## 2021-07-11 NOTE — Anesthesia Procedure Notes (Addendum)
Procedure Name: LMA Insertion Date/Time: 07/11/2021 7:48 AM Performed by: Maryella Shivers, CRNA Pre-anesthesia Checklist: Patient identified, Emergency Drugs available, Suction available and Patient being monitored Patient Re-evaluated:Patient Re-evaluated prior to induction Oxygen Delivery Method: Circle system utilized Preoxygenation: Pre-oxygenation with 100% oxygen Induction Type: IV induction Ventilation: Mask ventilation without difficulty LMA: LMA inserted LMA Size: 4.0 Number of attempts: 1 Airway Equipment and Method: Bite block Placement Confirmation: positive ETCO2 Tube secured with: Tape Dental Injury: Teeth and Oropharynx as per pre-operative assessment

## 2021-07-11 NOTE — Interval H&P Note (Signed)
History and Physical Interval Note:  07/11/2021 7:18 AM  [name]  has presented today for surgery, with the diagnosis of RIGHT BREAST CANCER.  The various methods of treatment have been discussed with the patient and family. After consideration of risks, benefits and other options for treatment, the patient has consented to  Procedure(s): RIGHT BREAST LUMPECTOMY WITH RADIOACTIVE SEED AND RIGHT SENTINEL LYMPH NODE BIOPSY (Right) RADIOACTIVE SEED GUIDED RIGHT AXILLARY LYMPH NODE BIOPSY (Right) as a surgical intervention.  The patient's history has been reviewed, patient examined, no change in status, stable for surgery.  I have reviewed the patient's chart and labs.  Questions were answered to the patient's satisfaction.     Rebecca [name]

## 2021-07-11 NOTE — Anesthesia Postprocedure Evaluation (Signed)
Anesthesia Post Note  Patient: Rebecca [name]  Procedure(s) Performed: RIGHT BREAST LUMPECTOMY WITH RADIOACTIVE SEED AND RIGHT SENTINEL LYMPH NODE BIOPSY (Right: Breast) RADIOACTIVE SEED GUIDED RIGHT AXILLARY LYMPH NODE BIOPSY (Right: Breast)     Patient location during evaluation: PACU Anesthesia Type: General Level of consciousness: awake and alert Pain management: pain level controlled Vital Signs Assessment: post-procedure vital signs reviewed and stable Respiratory status: spontaneous breathing, nonlabored ventilation, respiratory function stable and patient connected to nasal cannula oxygen Cardiovascular status: blood pressure returned to baseline and stable Postop Assessment: no apparent nausea or vomiting Anesthetic complications: no   No notable events documented.  Last Vitals:  Vitals:   07/11/21 1045 07/11/21 1049  BP: 100/69   Pulse: 76   Resp: 16   Temp:    SpO2: 97% 95%    Last Pain:  Vitals:   07/11/21 1030  TempSrc:   PainSc: 3                  Barnet Glasgow

## 2021-07-11 NOTE — Progress Notes (Signed)
Nuc med injections completed. Patient tolerated well.   

## 2021-07-12 NOTE — Progress Notes (Signed)
Left message stating courtesy call and if any questions or concerns please call the doctors office.  

## 2021-07-17 ENCOUNTER — Encounter (HOSPITAL_BASED_OUTPATIENT_CLINIC_OR_DEPARTMENT_OTHER): Payer: Self-pay | Admitting: Surgery

## 2021-07-18 ENCOUNTER — Encounter: Payer: Self-pay | Admitting: Surgery

## 2021-07-18 ENCOUNTER — Encounter: Payer: Self-pay | Admitting: *Deleted

## 2021-07-18 DIAGNOSIS — Z170 Estrogen receptor positive status [ER+]: Secondary | ICD-10-CM

## 2021-07-18 DIAGNOSIS — C50311 Malignant neoplasm of lower-inner quadrant of right female breast: Secondary | ICD-10-CM

## 2021-07-18 LAB — SURGICAL PATHOLOGY

## 2021-07-21 ENCOUNTER — Telehealth: Payer: Self-pay | Admitting: *Deleted

## 2021-07-21 ENCOUNTER — Encounter (HOSPITAL_COMMUNITY): Payer: Self-pay | Admitting: Emergency Medicine

## 2021-07-21 ENCOUNTER — Other Ambulatory Visit: Payer: Self-pay

## 2021-07-21 ENCOUNTER — Ambulatory Visit (HOSPITAL_COMMUNITY)
Admission: EM | Admit: 2021-07-21 | Discharge: 2021-07-21 | Disposition: A | Discharge status: Home / Self Care | Payer: 59 | Attending: Nurse Practitioner | Admitting: Nurse Practitioner

## 2021-07-21 DIAGNOSIS — C50911 Malignant neoplasm of unspecified site of right female breast: Secondary | ICD-10-CM | POA: Diagnosis not present

## 2021-07-21 DIAGNOSIS — T8149XA Infection following a procedure, other surgical site, initial encounter: Secondary | ICD-10-CM

## 2021-07-21 MED ORDER — CLINDAMYCIN HCL 150 MG PO CAPS: 450.0000 mg | ORAL_CAPSULE | Freq: Three times a day (TID) | ORAL | 0 refills | Status: AC

## 2021-07-21 NOTE — ED Triage Notes (Signed)
Pt presents with concern of Swelling and infection around surgical site after surgery on 9/20.

## 2021-07-21 NOTE — ED Provider Notes (Signed)
[place]    CSN: [id number] Arrival date & time: 07/21/21  1420      History   Chief Complaint Chief Complaint  Patient presents with   Breast Pain    HPI Rebecca [name] is a 55 y.o. female.    Deri Fuelling #[id number] Arabic Translation   Subjective:   Rebecca [name] is a 55 y.o. female who presents today for wound check. Patient recently diagnosed with stage II right breast cancer. She underwent a right breast lumpectomy with radioactive seed and right sentinel lymph node biopsy on 07/11/21. Patient reports that she has been doing well up until 1 day ago. Patient reports that she has noted increased pain and swelling to the site of the right axillary area. She also reports a scant amount of clear drainage from the site as well. The patient has a second surgical wound under the right breast that she reports is of concern due to pain and swelling but she denies any drainage. She denies any chills, fevers, body aches, nausea or vomiting. She has not tried anything for her symptoms. She did not attempt to notify her oncologist or surgeon prior to seeking care here.       Past Medical History:  Diagnosis Date   Arthritis    Breast cancer ([place])    Diabetes mellitus    Family history of lymphoma 01/18/2021   Family history of throat cancer 01/18/2021   Hyperlipidemia    Hypertension     Patient Active Problem List   Diagnosis Date Noted   Port-A-Cath in place 02/07/2021   Genetic testing 02/02/2021   Family history of throat cancer 01/18/2021   Family history of lymphoma 01/18/2021   Malignant neoplasm of lower-inner quadrant of right breast of female, estrogen receptor positive ([place]) 01/12/2021   Posterior tibial tendinitis, right leg 01/26/2020   Diabetes ([place]) 03/11/2017   Muscle pain 08/06/2013   Tiredness 08/06/2013   [place]ida vaginitis 08/19/2012   Dental cavities 08/19/2012   Knee pain, bilateral 04/16/2012   Diabetes mellitus type 2, controlled  ([place]) 04/16/2012   Hyperlipidemia 04/16/2012   Palpitations 04/16/2012   Back pain 04/16/2012    Past Surgical History:  Procedure Laterality Date   BREAST LUMPECTOMY WITH RADIOACTIVE SEED AND SENTINEL LYMPH NODE BIOPSY Right 07/11/2021   Procedure: RIGHT BREAST LUMPECTOMY WITH RADIOACTIVE SEED AND RIGHT SENTINEL LYMPH NODE BIOPSY;  Surgeon: Erroll Luna, MD;  Location: [facility];  Service: General;  Laterality: Right;   CESAREAN SECTION     PORTACATH PLACEMENT N/A 01/26/2021   Procedure: INSERTION PORT-A-CATH;  Surgeon: Erroll Luna, MD;  Location: [facility];  Service: General;  Laterality: N/A;  60   [place] Right 07/11/2021   Procedure: RADIOACTIVE SEED GUIDED RIGHT AXILLARY LYMPH NODE BIOPSY;  Surgeon: Erroll Luna, MD;  Location: [facility];  Service: General;  Laterality: Right;    OB History   No obstetric history on file.      Home Medications    Prior to Admission medications   Medication Sig Start Date End Date Taking? Authorizing Provider  clindamycin (CLEOCIN) 150 MG capsule Take 3 capsules (450 mg total) by mouth 3 (three) times daily for 7 days. 07/21/21 07/28/21 Yes Enrique Sack, FNP  amLODipine (NORVASC) 10 MG tablet TAKE 1 TABLET BY MOUTH EVERY DAY 05/01/21   [name], MD  Ascorbic Acid (VITAMIN C) 1000 MG tablet Take 1 tablet (1,000 mg total) by mouth daily. 03/07/21  Alla Feeling, NP  atorvastatin (LIPITOR) 80 MG tablet TAKE 1 TABLET BY MOUTH EVERY DAY 05/18/21   [name], MD  Blood Glucose Monitoring Suppl (ONETOUCH VERIO REFLECT) w/Device KIT 1 kit by Does not apply route in the morning, at noon, and at bedtime. Use as directed to test blood sugar up to three times daily.E11.9 02/02/20   [name], MD  cetirizine (ZYRTEC) 10 MG tablet Take 1 tablet (10 mg total) by mouth daily. 11/21/19   Zigmund Gottron, NP  cloNIDine (CATAPRES) 0.1 MG tablet Take 1 tablet (0.1 mg  total) by mouth at bedtime as needed. 11/07/20   [name], MD  clotrimazole (CLOTRIMAZOLE AF) 1 % cream Apply 1 application topically 2 (two) times daily. 03/31/21   Gardenia Phlegm, NP  cyclobenzaprine (FLEXERIL) 10 MG tablet Take 10 mg by mouth 3 (three) times daily as needed for muscle spasms.    [provider]  dapagliflozin propanediol (FARXIGA) 10 MG TABS tablet Take 1 tablet (10 mg total) by mouth daily with breakfast. 06/29/21   [name], MD  ergocalciferol (DRISDOL) 1.25 MG (50000 UT) capsule Take 1 capsule (50,000 Units total) by mouth once a week. 11/13/20   [name], MD  gabapentin (NEURONTIN) 300 MG capsule Take 1 capsule (300 mg total) by mouth 2 (two) times daily. 06/29/21   [name], MD  glipiZIDE (GLUCOTROL) 5 MG tablet TAKE 1 TABLET BY MOUTH DAILY 05/13/21   [name], MD  glucose blood (ONETOUCH VERIO) test strip Use as directed to test blood sugar up to three times daily. E11.9 02/02/20   [name], MD  HYDROcodone-acetaminophen (NORCO/VICODIN) 5-325 MG tablet Take 1 tablet by mouth every 6 (six) hours as needed for moderate pain. 07/11/21   Erroll Luna, MD  hydrOXYzine (ATARAX/VISTARIL) 25 MG tablet Take 1 tablet (25 mg total) by mouth 3 (three) times daily as needed. 06/29/21   [name], MD  ibuprofen (ADVIL) 800 MG tablet Take 1 tablet (800 mg total) by mouth every 8 (eight) hours as needed. 07/11/21   Cornett, Marcello Moores, MD  Lancets [facility] DELICA PLUS [id number]) MISC 1 Device by Does not apply route in the morning, at noon, and at bedtime. Use as directed to test blood sugar up to three times daily. E11.9 02/02/20   [name], MD  lidocaine-prilocaine (EMLA) cream APPLY TO AFFECTED AREA AS DIRECTED 03/31/21 03/31/22  Causey, Charlestine Massed, NP  metFORMIN (GLUCOPHAGE) 500 MG tablet Take 2 tablets (1,000 mg total) by mouth 2 (two) times daily. 06/29/21   [name], MD  naproxen (NAPROSYN) 500 MG tablet Take 1 tablet  (500 mg total) by mouth 2 (two) times daily with a meal. 02/09/20   Suzan Slick, NP  Omega-3 1000 MG CAPS Take 1 capsule (1,000 mg total) by mouth daily. 03/07/21   Alla Feeling, NP  omeprazole (PRILOSEC) 20 MG capsule Take 1 capsule (20 mg total) by mouth daily. 11/21/19   Augusto Gamble B, NP  polyethylene glycol (MIRALAX / GLYCOLAX) 17 g packet TAKE 17 G BY MOUTH DAILY. 11/07/20 11/07/21  [name], MD  valACYclovir (VALTREX) 500 MG tablet Take 1 tablet (500 mg total) by mouth daily. 06/13/21   Magrinat, Virgie Dad, MD  vitamin B-12 (CYANOCOBALAMIN) 100 MCG tablet Take 1 tablet (100 mcg total) by mouth daily. 03/07/21   Alla Feeling, NP    Family History Family History  Problem Relation Age of Onset   Diabetes Mother    Heart disease Mother  Diabetes Father    Throat cancer Father        d. 12s   Diabetes Sister    Diabetes Brother    Lymphoma Cousin        maternal cousin; d. 36s    Social History Social History   Tobacco Use   Smoking status: Never    Passive exposure: Yes   Smokeless tobacco: Never  Substance Use Topics   Alcohol use: No   Drug use: No     Allergies   Lisinopril   Review of Systems Review of Systems  Constitutional:  Negative for chills, fatigue and fever.  Gastrointestinal:  Negative for nausea and vomiting.  Skin:  Positive for wound.    Physical Exam Triage Vital Signs ED Triage Vitals  Enc Vitals Group     BP 07/21/21 1452 130/77     Pulse Rate 07/21/21 1452 91     Resp 07/21/21 1452 16     Temp 07/21/21 1452 98 F (36.7 C)     Temp Source 07/21/21 1452 Oral     SpO2 07/21/21 1452 100 %     Weight --      Height --      Head Circumference --      Peak Flow --      Pain Score 07/21/21 1450 5     Pain Loc --      Pain Edu? --      Excl. in [place]? --    No data found.  Updated Vital Signs BP 130/77 (BP Location: Left Arm)   Pulse 91   Temp 98 F (36.7 C) (Oral)   Resp 16   SpO2 100%   Visual Acuity Right Eye  Distance:   Left Eye Distance:   Bilateral Distance:    Right Eye Near:   Left Eye Near:    Bilateral Near:     Physical Exam Constitutional:      General: She is not in acute distress.    Appearance: Normal appearance. She is not ill-appearing or toxic-appearing.  HENT:     Head: Normocephalic.  Cardiovascular:     Rate and Rhythm: Normal rate.  Pulmonary:     Effort: Pulmonary effort is normal.  Musculoskeletal:        General: Normal range of motion.     Cervical back: Normal range of motion and neck supple.  Skin:    General: Skin is warm and dry.     Comments: Surgical incisions noted (see pictures below)  No swelling, erythema or drainage noted.  Possible early infection noted to incision under right breast    Neurological:     General: No focal deficit present.     Mental Status: She is alert and oriented to person, place, and time.  Psychiatric:        Mood and Affect: Mood normal.        Behavior: Behavior normal.   Surgical Wound #1 - under right breast    Surgical Wound #2 - right axillary region      UC Treatments / Results  Labs (all labs ordered are listed, but only abnormal results are displayed) Labs Reviewed - No data to display  EKG   Radiology No results found.  Procedures Procedures (including critical care time)  Medications Ordered in UC Medications - No data to display  Initial Impression / Assessment and Plan / UC Course  I have reviewed the triage vital signs and the nursing notes.  Pertinent labs & imaging results that were available during my care of the patient were reviewed by me and considered in my medical decision making (see chart for details).    55 y.o. female who presents today for wound check. Patient recently diagnosed with stage II right breast cancer and underwent a right breast lumpectomy with radioactive seed and right sentinel lymph node biopsy on 07/11/21. Patient reports that she has been doing well up until  1 day ago. Patient reports that she has noted increased pain and swelling to the site of the right axillary area with scant amount of clear drainage. The patient has a second surgical wound under the right breast that she reports is of concern due to pain and swelling but no drainage. She denies any chills, fevers, body aches, nausea or vomiting. Patient is AAOx3. Afebrile. Nontoxic appearing. Surgical sites assessed. No drainage, swelling or significant erythema noted. Possible early wound infection noted to site under right breast.   Plan:  1. Discussed appropriate home care of this wound 2. Antibiotics per orders  3. Patient instructions were given. 4. Follow up with Dr. Brantley Stage on Tuesday October 4 @ 9 AM   Today's evaluation has revealed no signs of a dangerous process. Discussed diagnosis with patient and/or guardian. Patient and/or guardian aware of their diagnosis, possible red flag symptoms to watch out for and need for close follow up. Patient and/or guardian understands verbal and written discharge instructions. Patient and/or guardian comfortable with plan and disposition.  Patient and/or guardian has a clear mental status at this time, good insight into illness (after discussion and teaching) and has clear judgment to make decisions regarding their care  This care was provided during an unprecedented National Emergency due to the Novel Coronavirus (COVID-19) pandemic. COVID-19 infections and transmission risks place heavy strains on healthcare resources.  As this pandemic evolves, our facility, providers, and staff strive to respond fluidly, to remain operational, and to provide care relative to available resources and information. Outcomes are unpredictable and treatments are without well-defined guidelines. Further, the impact of COVID-19 on all aspects of urgent care, including the impact to patients seeking care for reasons other than COVID-19, is unavoidable during this national  emergency. At this time of the global pandemic, management of patients has significantly changed, even for non-COVID positive patients given high local and regional COVID volumes at this time requiring high healthcare system and resource utilization. The standard of care for management of both COVID suspected and non-COVID suspected patients continues to change rapidly at the local, regional, national, and global levels. This patient was worked up and treated to the best available but ever changing evidence and resources available at this current time.   Documentation was completed with the aid of voice recognition software. Transcription may contain typographical errors. Final Clinical Impressions(s) / UC Diagnoses   Final diagnoses:  Surgical site infection  Stage II breast cancer, right [facility])     Discharge Instructions      Take medications as prescribed  The infection should improve 24-48 hours after you start antibiotics. After 24-48 hours, redness around the wound should stop spreading, and the wound should be less painful. It is important to always contact your oncologist and/or surgeon for issues related to surgery issues FIRST before coming to the urgent care or emergency room unless there are life threatening concerns or issues that occur after the office has closed or on the weekend       ED Prescriptions  Medication Sig Dispense Auth. Provider   clindamycin (CLEOCIN) 150 MG capsule Take 3 capsules (450 mg total) by mouth 3 (three) times daily for 7 days. 63 capsule Enrique Sack, FNP      PDMP not reviewed this encounter.   Enrique Sack, [place] 07/21/21 [phone]

## 2021-07-21 NOTE — Telephone Encounter (Signed)
This RN spoke with Aldona Bar NP at the [facility].  Pt has come in to their facility per concerns with post surgical wounds " and she is concerned they could be infected "  She states she has a follow up with the surgeon on 10/14 " but is concerned with waiting that long"  Per Aldona Bar she states wounds overall look good- but noted pt is not sure about post op care of the wounds. " Just don't think she has cleaned the area well "  Pt states she tried to call the office but feels that due to her language barrier she was unable to communicate well.  This RN called [facility] and obtained an appt on 10/5 with Dr Brantley Stage- and called Samantha back at Urgent Care per above.

## 2021-07-21 NOTE — Discharge Instructions (Signed)
Take medications as prescribed  The infection should improve 24-48 hours after you start antibiotics. After 24-48 hours, redness around the wound should stop spreading, and the wound should be less painful. It is important to always contact your oncologist and/or surgeon for issues related to surgery issues FIRST before coming to the urgent care or emergency room unless there are life threatening concerns or issues that occur after the office has closed or on the weekend

## 2021-07-24 ENCOUNTER — Encounter: Payer: Self-pay | Admitting: *Deleted

## 2021-07-30 ENCOUNTER — Other Ambulatory Visit: Payer: Self-pay | Admitting: Family Medicine

## 2021-07-30 NOTE — Telephone Encounter (Signed)
Requested Prescriptions  Pending Prescriptions Disp Refills  . amLODipine (NORVASC) 10 MG tablet [Pharmacy Med Name: AMLODIPINE BESYLATE 10MG  TABLETS] 90 tablet 0    Sig: TAKE 1 TABLET BY MOUTH EVERY DAY     Cardiovascular:  Calcium Channel Blockers Passed - 07/30/2021  3:34 AM      Passed - Last BP in normal range    BP Readings from Last 1 Encounters:  07/21/21 130/77         Passed - Valid encounter within last 6 months    Recent Outpatient Visits          1 month ago Type 2 diabetes mellitus without complication, without long-term current use of insulin ([place])   [place], [name], MD   7 months ago Annual physical exam   [place], [place], MD   8 months ago Vasomotor symptoms due to menopause   [place], [name], MD   1 year ago Type 2 diabetes mellitus without complication, without long-term current use of insulin [facility])   [place], Annie Main L, RPH-CPP   1 year ago Type 2 diabetes mellitus without complication, without long-term current use of insulin [facility])   [place], RPH-CPP

## 2021-07-31 NOTE — Progress Notes (Signed)
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  She is accompanied by an interpreter  Since her last visit, she underwent right lumpectomy on 07/11/2021 under Dr. Brantley Stage. Pathology from the procedure ([id number]) showed no residual carcinoma.  All three biopsied lymph nodes were negative (0/3).  She continues on pembrolizumab. We held her last dose for her surgery. She is due for a dose today.  She understands she will continue this treatment through April 2023  We are following her thyroid studies: Results for Rebecca, [name] (MRN [id number]) as of 08/01/2021 07:45  Ref. Range 01/16/2016 10:06 07/12/2019 14:08 02/14/2021 08:49 05/23/2021 07:55 06/13/2021 08:20  TSH Latest Ref Range: 0.308 - 3.960 uIU/mL 2.29 1.361 1.064 1.053 1.204    REVIEW OF SYSTEMS: Rebecca [name] has made a very good recovery from her surgery.  She still has pain particularly in the right axilla.  She has a little pain in the arm as well and she has some limitation of the range of motion in the right upper extremity.  She has not noted any swelling.  She had a little bit of redness in the superior aspect of the right anterior chest wall and was treated with antibiotics.  That has cleared.  Detailed review of systems  today was otherwise stable  COVID 19 VACCINATION STATUS: fully vaccinated [employer]), with booster 12/2020   HISTORY OF CURRENT ILLNESS: From the original intake note:  Rebecca [name] herself palpated a mass in the right axilla. She underwent bilateral diagnostic mammography with tomography and right breast ultrasonography at [facility] on 01/03/2021 showing: breast density category B; two adjacent suspicious right breast masses at 5 o'clock; markedly abnormal right axillary lymph node; no evidence of malignancy on left.  Accordingly on 01/06/2021 she proceeded to biopsy of the right breast area in question. The pathology from this procedure ([id number]) showed: invasive ductal carcinoma, grade 3. Prognostic indicators significant for: estrogen receptor, 10% positive with weak staining intensity and progesterone receptor, 0% negative. Proliferation marker Ki67 at 90%. HER2 equivocal by immunohistochemistry (2+), but negative by fluorescent in situ hybridization with a signals ratio 1.38 and number per cell 3.63.  Cancer Staging Malignant neoplasm of lower-inner quadrant of right breast of female, estrogen receptor positive ([place]) Staging form: Breast, AJCC 8th Edition - Clinical: Stage IIIB (cT2, cN1, cM0, G3, ER-, PR-, HER2-) - Signed by Chauncey Cruel, MD on 01/18/2021 Stage prefix: Initial diagnosis Histologic grading system: 3 grade system  [name] is as detailed below.   PAST MEDICAL HISTORY: Past Medical History:  Diagnosis Date   Arthritis    Breast cancer ([place])    Diabetes mellitus    Family history of lymphoma 01/18/2021   Family history of throat cancer 01/18/2021   Hyperlipidemia    Hypertension     PAST SURGICAL HISTORY: Past Surgical History:  Procedure Laterality Date   BREAST  LUMPECTOMY WITH RADIOACTIVE SEED AND SENTINEL LYMPH NODE BIOPSY Right 07/11/2021   Procedure: RIGHT BREAST LUMPECTOMY WITH RADIOACTIVE SEED AND RIGHT SENTINEL  LYMPH NODE BIOPSY;  Surgeon: Erroll Luna, MD;  Location: [facility];  Service: General;  Laterality: Right;   CESAREAN SECTION     PORTACATH PLACEMENT N/A 01/26/2021   Procedure: INSERTION PORT-A-CATH;  Surgeon: Erroll Luna, MD;  Location: [facility];  Service: General;  Laterality: N/A;  [place] Right 07/11/2021   Procedure: RADIOACTIVE SEED GUIDED RIGHT AXILLARY LYMPH NODE BIOPSY;  Surgeon: Erroll Luna, MD;  Location: [facility];  Service: General;  Laterality: Right;    FAMILY HISTORY: Family History  Problem Relation Age of Onset   Diabetes Mother    Heart disease Mother    Diabetes Father    Throat cancer Father        d. 11s   Diabetes Sister    Diabetes Brother    Lymphoma Cousin        maternal cousin; d. 50s   Her father died in his 60's and her mother in her 82'U, both from complications of diabetes. Rebecca [name] has 6 brothers and 1 sister. There is no family history of breast or ovarian cancer to her knowledge.   GYNECOLOGIC HISTORY:  No LMP recorded. Patient is perimenopausal. Menarche: 55 years old [place] P 0 LMP 10/31/2020 (before that, 06/2020) Contraceptive used from 2017-2018 HRT n/a  Hysterectomy? no BSO? no   SOCIAL HISTORY: (updated 12/2020)  Rebecca [name] is originally from [place]. 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 She is divorced. She lives at home with her nephew Rebecca [name], who is 50 and works for [employer]. She attends [employer] ([employer]).    ADVANCED DIRECTIVES: not in place; at the 01/18/2021 visit she stated her ex-husband Rebecca [name], who is disabled due to past injuries, ([phone]) would be the person to contact if she became incapacitated [the patient states aside from her nephew she has no other relatives in [place]: Social History   Tobacco Use   Smoking status:  Never    Passive exposure: Yes   Smokeless tobacco: Never  Substance Use Topics   Alcohol use: No   Drug use: No     Colonoscopy: never done, referral in place  PAP: 12/2020, negative  Bone density: never done   Allergies  Allergen Reactions   Lisinopril Swelling    Swelling in throat, coughing, difficulty swallowing, could not sleep. Occurred Dec 2020.    Current Outpatient Medications  Medication Sig Dispense Refill   amLODipine (NORVASC) 10 MG tablet TAKE 1 TABLET BY MOUTH EVERY DAY 90 tablet 0   Ascorbic Acid (VITAMIN C) 1000 MG tablet Take 1 tablet (1,000 mg total) by mouth daily. 30 tablet 0   atorvastatin (LIPITOR) 80 MG tablet TAKE 1 TABLET BY MOUTH EVERY DAY 90 tablet 1   Blood Glucose Monitoring Suppl (ONETOUCH VERIO REFLECT) w/Device KIT 1 kit by Does not apply route in the morning, at noon, and at bedtime. Use as directed to test blood sugar up to three times daily.E11.9 1 kit 0   cetirizine (ZYRTEC) 10 MG tablet Take 1 tablet (10 mg total) by mouth daily. 30 tablet 0   cloNIDine (CATAPRES) 0.1 MG tablet Take 1 tablet (0.1 mg total) by mouth at bedtime as needed. 90 tablet 1   clotrimazole (CLOTRIMAZOLE AF)  1 % cream Apply 1 application topically 2 (two) times daily. 30 g 1   cyclobenzaprine (FLEXERIL) 10 MG tablet Take 10 mg by mouth 3 (three) times daily as needed for muscle spasms.     dapagliflozin propanediol (FARXIGA) 10 MG TABS tablet Take 1 tablet (10 mg total) by mouth daily with breakfast. 90 tablet 1   ergocalciferol (DRISDOL) 1.25 MG (50000 UT) capsule Take 1 capsule (50,000 Units total) by mouth once a week. 12 capsule 0   gabapentin (NEURONTIN) 300 MG capsule Take 1 capsule (300 mg total) by mouth 2 (two) times daily. 60 capsule 3   glipiZIDE (GLUCOTROL) 5 MG tablet TAKE 1 TABLET BY MOUTH DAILY 90 tablet 0   glucose blood (ONETOUCH VERIO) test strip Use as directed to test blood sugar up to three times daily. E11.9 100 each 6   HYDROcodone-acetaminophen  (NORCO/VICODIN) 5-325 MG tablet Take 1 tablet by mouth every 6 (six) hours as needed for moderate pain. 15 tablet 0   hydrOXYzine (ATARAX/VISTARIL) 25 MG tablet Take 1 tablet (25 mg total) by mouth 3 (three) times daily as needed. 60 tablet 1   ibuprofen (ADVIL) 800 MG tablet Take 1 tablet (800 mg total) by mouth every 8 (eight) hours as needed. 30 tablet 0   Lancets (ONETOUCH DELICA PLUS [id number]) MISC 1 Device by Does not apply route in the morning, at noon, and at bedtime. Use as directed to test blood sugar up to three times daily. E11.9 100 each 6   lidocaine-prilocaine (EMLA) cream APPLY TO AFFECTED AREA AS DIRECTED 30 g 3   metFORMIN (GLUCOPHAGE) 500 MG tablet Take 2 tablets (1,000 mg total) by mouth 2 (two) times daily. 180 tablet 1   naproxen (NAPROSYN) 500 MG tablet Take 1 tablet (500 mg total) by mouth 2 (two) times daily with a meal. 30 tablet 1   Omega-3 1000 MG CAPS Take 1 capsule (1,000 mg total) by mouth daily. 30 capsule 0   omeprazole (PRILOSEC) 20 MG capsule Take 1 capsule (20 mg total) by mouth daily. 30 capsule 0   polyethylene glycol (MIRALAX / GLYCOLAX) 17 g packet TAKE 17 G BY MOUTH DAILY. 14 each 0   valACYclovir (VALTREX) 500 MG tablet Take 1 tablet (500 mg total) by mouth daily. 30 tablet 0   vitamin B-12 (CYANOCOBALAMIN) 100 MCG tablet Take 1 tablet (100 mcg total) by mouth daily. 30 tablet 0   No current facility-administered medications for this visit.    OBJECTIVE: [place] woman who appears stated age  55:   08/01/21 0850  BP: 136/86  Pulse: 99  Resp: 16  Temp: 97.7 F (36.5 C)  SpO2: 100%     Body mass index is 23.39 kg/m.   Wt Readings from Last 3 Encounters:  08/01/21 144 lb 14.4 oz (65.7 kg)  07/11/21 145 lb 1 oz (65.8 kg)  06/29/21 145 lb 6.4 oz (66 kg)      ECOG FS:1 - Symptomatic but completely ambulatory  Hair is coming in with no bald spots Sclerae unicteric, EOMs intact Wearing a mask No cervical or supraclavicular  adenopathy Lungs no rales or rhonchi Heart regular rate and rhythm Abd soft, nontender, positive bowel sounds MSK no focal spinal tenderness, no right upper extremity lymphedema Neuro: nonfocal, well oriented, appropriate affect Breasts: The right breast is status postlumpectomy.  The cosmetic result is excellent.  There is no dehiscence erythema or swelling.  The left breast and both axillae are benign   LAB RESULTS:  CMP  Component Value Date/Time   NA 138 07/03/2021 1254   NA 140 12/27/2020 1123   K 3.8 07/03/2021 1254   CL 100 07/03/2021 1254   CO2 25 07/03/2021 1254   GLUCOSE 168 (H) 07/03/2021 1254   BUN 10 07/03/2021 1254   BUN 11 12/27/2020 1123   CREATININE 0.45 07/03/2021 1254   CREATININE 0.66 06/13/2021 0820   CREATININE 0.50 11/02/2016 1102   CALCIUM 9.0 07/03/2021 1254   PROT 6.8 07/03/2021 1254   PROT 7.5 12/27/2020 1123   ALBUMIN 4.1 07/03/2021 1254   ALBUMIN 4.7 12/27/2020 1123   AST 31 07/03/2021 1254   AST 23 06/13/2021 0820   ALT 31 07/03/2021 1254   ALT 27 06/13/2021 0820   ALKPHOS 73 07/03/2021 1254   BILITOT 0.9 07/03/2021 1254   BILITOT 0.3 06/13/2021 0820   GFRNONAA >60 07/03/2021 1254   GFRNONAA >60 06/13/2021 0820   GFRNONAA >89 01/16/2016 1006   GFRAA 118 06/02/2020 0942   GFRAA >89 01/16/2016 1006    No results found for: TOTALPROTELP, ALBUMINELP, A1GS, A2GS, BETS, BETA2SER, GAMS, MSPIKE, SPEI  Lab Results  Component Value Date   WBC 4.2 08/01/2021   NEUTROABS 2.7 08/01/2021   HGB 12.5 08/01/2021   HCT 39.1 08/01/2021   MCV 84.1 08/01/2021   PLT 249 08/01/2021    No results found for: LABCA2  No components found for: [id number]  No results for input(s): INR in the last 168 hours.  No results found for: LABCA2  No results found for: VFI433  No results found for: IRJ188  No results found for: CZY606  No results found for: CA2729  No components found for: HGQUANT  No results found for: CEA1 / No results found for:  CEA1   No results found for: AFPTUMOR  No results found for: CHROMOGRNA  No results found for: KPAFRELGTCHN, LAMBDASER, KAPLAMBRATIO (kappa/lambda light chains)  No results found for: HGBA, HGBA2QUANT, HGBFQUANT, HGBSQUAN (Hemoglobinopathy evaluation)   No results found for: LDH  No results found for: IRON, TIBC, IRONPCTSAT (Iron and TIBC)  No results found for: FERRITIN  Urinalysis    Component Value Date/Time   COLORURINE YELLOW 06/24/2007 0116   APPEARANCEUR CLOUDY (A) 06/24/2007 0116   LABSPEC 1.025 09/18/2020 1125   PHURINE 5.5 09/18/2020 1125   GLUCOSEU 100 (A) 09/18/2020 1125   HGBUR TRACE (A) 09/18/2020 1125   BILIRUBINUR NEGATIVE 09/18/2020 1125   KETONESUR NEGATIVE 09/18/2020 1125   PROTEINUR NEGATIVE 09/18/2020 1125   UROBILINOGEN 0.2 09/18/2020 1125   NITRITE NEGATIVE 09/18/2020 1125   LEUKOCYTESUR NEGATIVE 09/18/2020 1125    STUDIES: NM Sentinel Node Inj-No Rpt (Breast)  Result Date: 07/11/2021 Sulfur Colloid was injected by the Nuclear Medicine Technologist for sentinel lymph node localization.   MM BREAST SURGICAL SPECIMEN  Result Date: 07/11/2021 CLINICAL DATA:  Evaluate surgical specimens of the RIGHT breast and axilla following lumpectomy for breast cancer and excision of metastatic RIGHT axillary lymph node. EXAM: SPECIMEN RADIOGRAPH OF THE RIGHT BREAST SPECIMEN RADIOGRAPH OF THE RIGHT AXILLA COMPARISON:  Previous exam(s). FINDINGS: Status post excision of the RIGHT breast and RIGHT axilla. The radioactive seed and RIBBON biopsy marker clip are present and completely intact. The radioactive seed and TRIBELL biopsy marker clip are present and completely intact. IMPRESSION: Specimen radiograph of the RIGHT breast and RIGHT axilla. Electronically Signed   By: Margarette Canada M.D.   On: 07/11/2021 08:45  MM Breast Surgical Specimen  Result Date: 07/11/2021 CLINICAL DATA:  Evaluate surgical specimens of the RIGHT  breast and axilla following lumpectomy for  breast cancer and excision of metastatic RIGHT axillary lymph node. EXAM: SPECIMEN RADIOGRAPH OF THE RIGHT BREAST SPECIMEN RADIOGRAPH OF THE RIGHT AXILLA COMPARISON:  Previous exam(s). FINDINGS: Status post excision of the RIGHT breast and RIGHT axilla. The radioactive seed and RIBBON biopsy marker clip are present and completely intact. The radioactive seed and TRIBELL biopsy marker clip are present and completely intact. IMPRESSION: Specimen radiograph of the RIGHT breast and RIGHT axilla. Electronically Signed   By: Margarette Canada M.D.   On: 07/11/2021 08:45  MM RT RADIOACTIVE SEED LOC MAMMO GUIDE  Result Date: 07/10/2021 CLINICAL DATA:  Patient presents for localization of a right breast carcinoma and a metastatic right axillary lymph node prior to surgical excision. Breast lesion localized with mammographic guidance and axillary lymph node localized with sonographic guidance. EXAM: ULTRASOUND GUIDED RADIOACTIVE SEED LOCALIZATION OF THE RIGHT AXILLA MAMMOGRAPHIC GUIDED RADIOACTIVE SEED LOCALIZATION OF THE RIGHT BREAST COMPARISON:  Previous exam(s). FINDINGS: Patient presents for radioactive seed localization prior to . I met with the patient and we discussed the procedure of seed localization including benefits and alternatives. We discussed the high likelihood of a successful procedure. We discussed the risks of the procedure including infection, bleeding, tissue injury and further surgery. We discussed the low dose of radioactivity involved in the procedure. Informed, written consent was given. The usual time-out protocol was performed immediately prior to the procedure. Metastatic right axillary lymph node Using ultrasound guidance, sterile technique, 1% lidocaine and an I-125 radioactive seed, the metastatic right axillary lymph node and associated Tribell marker clip were localized using a inferior approach. The lymph node could not be visualized mammographically, but the seed was well seen deploying  within the node on sonography. Follow-up survey of the patient confirms presence of the radioactive seed. Order number of I-125 seed:  [id number]. Total activity:  3.016 millicuries reference Date: 07/06/2021 Right breast carcinoma Using mammographic guidance, sterile technique, 1% lidocaine and an I-125 radioactive seed, the ribbon shaped biopsy clip was localized using a medial approach. The follow-up mammogram images confirm the seed in the expected location and were marked for Dr. Brantley Stage. Follow-up survey of the patient confirms the presence of a radioactive seed within the inferior breast. Order number of I-125 seed:  [id number]. Total activity:  7.322 millicuries reference Date: 06/13/2021 The patient tolerated the procedure well and was released from the [facility]. She was given instructions regarding seed removal. 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 No apparent complications. Electronically Signed   By: Lajean Manes M.D.   On: 07/10/2021 14:02  [place] RT RADIOACTIVE SEED LOC  Result Date: 07/10/2021 CLINICAL DATA:  Patient presents for localization of a right breast carcinoma and a metastatic right axillary lymph node prior to surgical excision. Breast lesion localized with mammographic guidance and axillary lymph node localized with sonographic guidance. EXAM: ULTRASOUND GUIDED RADIOACTIVE SEED LOCALIZATION OF THE RIGHT AXILLA MAMMOGRAPHIC GUIDED RADIOACTIVE SEED LOCALIZATION OF THE RIGHT BREAST COMPARISON:  Previous exam(s). FINDINGS: Patient presents for radioactive seed localization prior to . I met with the patient and we discussed the procedure of seed localization including benefits and alternatives. We discussed the high likelihood of a successful procedure. We discussed the risks of the procedure including infection, bleeding, tissue injury and further surgery. We discussed the low dose of radioactivity involved in the procedure. Informed, written  consent was given. The usual time-out protocol was performed immediately prior to the procedure. Metastatic right axillary  lymph node Using ultrasound guidance, sterile technique, 1% lidocaine and an I-125 radioactive seed, the metastatic right axillary lymph node and associated Tribell marker clip were localized using a inferior approach. The lymph node could not be visualized mammographically, but the seed was well seen deploying within the node on sonography. Follow-up survey of the patient confirms presence of the radioactive seed. Order number of I-125 seed:  [id number]. Total activity:  3.810 millicuries reference Date: 07/06/2021 Right breast carcinoma Using mammographic guidance, sterile technique, 1% lidocaine and an I-125 radioactive seed, the ribbon shaped biopsy clip was localized using a medial approach. The follow-up mammogram images confirm the seed in the expected location and were marked for Dr. Brantley Stage. Follow-up survey of the patient confirms the presence of a radioactive seed within the inferior breast. Order number of I-125 seed:  [id number]. Total activity:  2.778 millicuries reference Date: 06/13/2021 The patient tolerated the procedure well and was released from the [facility]. She was given instructions regarding seed removal. 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 No apparent complications. Electronically Signed   By: Lajean Manes M.D.   On: 07/10/2021 14:02    ELIGIBLE FOR AVAILABLE RESEARCH PROTOCOL: no  ASSESSMENT: 55 y.o. [place] woman status post right breast lower inner quadrant biopsy 01/06/2021 for a clinically T2 N1, stage IIIB functionally triple negative invasive ductal carcinoma, grade 3, with and MIB-1 of 90%  (a) chest CT scan 01/29/2021 shows no evidence of metastatic disease  (b) bone scan 02/01/2021 shows no evidence of metastatic disease  (1) neoadjuvant chemotherapy consisting of pembrolizumab given day 1 together  with carboplatin and paclitaxel, with Taxol given on day 8 and 15, and filgrastim on days 16,17 and 18, started 02/07/2021, repeated every 21 days x 2, followed by doxorubicin and cyclophosphamide every 21 days x 4 also given with pembrolizumab on day 1 and PEG filgrastim day 3, started 03/22/2021 and completed 05/23/2021  (a) carboplatin/paclitaxel discontinued after the cycle 2-day 8 dose with neuropathy developing (subsequently resolved)   (2) pembrolizumab continuing to total 1 year  (3) right lumpectomy and sentinel lymph node sampling 07/11/2021 showed a complete pathologic response (ypT0 ypN0)  (a) a total of 3 right axillary lymph nodes were removed  (4) adjuvant radiation pending  (5) genetics testing 01/31/2021 through the [facility] Panel found no deleterious mutations in AIP, ALK, APC, ATM, AXIN2, BAP1, BARD1, BLM, BMPR1A, BRCA1, BRCA2, BRIP1, CDC73, CDH1, CDK4, CDKN1B, CDKN2A, CHEK2, CTNNA1, DICER1, FANCC, FH, FLCN, GALNT12, KIF1B, LZTR1, MAX, MEN1, MET, MLH1, MSH2, MSH3, MSH6, MUTYH, NBN, NF1, NF2, NTHL1, PALB2, PHOX2B, PMS2, POT1, PRKAR1A, PTCH1, PTEN, RAD51C, RAD51D, RB1, RECQL, RET, SDHA, SDHAF2, SDHB, SDHC, SDHD, SMAD4, SMARCA4, SMARCB1, SMARCE1, STK11, SUFU, TMEM127, TP53, TSC1, TSC2, VHL and XRCC2 (sequencing and deletion/duplication); EGFR, EGLN1, HOXB13, KIT, MITF, PDGFRA, POLD1, and POLE (sequencing only); EPCAM and GREM1 (deletion/duplication only).   PLAN: Kamaiyah did very well with her surgery and I gave her a copy of the pathology for her own records.  She understands there was no residual cancer and this is the best possible response to the treatment she received.  She receives [place] today and will continue that through April.  She has tolerated that well.  We will continue to follow her thyroid studies.  She does have discomfort in the right armpit and some range of motion issues.  She has already been referred to rehab.  There is fortunately  no lymphedema.  She will see radiation oncology and  likely will start her radiation treatments next week.  I will see her again 09/12/2021.  She knows to call us for any other issue that may develop before then  Total encounter time 25 minutes.Sarajane Jews C. Kyanna Mahrt, MD 08/01/21 9:08 AM Medical Oncology and Hematology [facility] [place], [place] [address] Tel. [phone]    Fax. [phone]   I, Wilburn Mylar, am acting as scribe for Dr. Virgie Dad. Kellar Westberg.  I, Lurline Del MD, have reviewed the above documentation for accuracy and completeness, and I agree with the above.   *Total Encounter Time as defined by the Centers for Medicare and Medicaid Services includes, in addition to the face-to-face time of a patient visit (documented in the note above) non-face-to-face time: obtaining and reviewing outside history, ordering and reviewing medications, tests or procedures, care coordination (communications with other health care professionals or caregivers) and documentation in the medical record.

## 2021-08-01 ENCOUNTER — Inpatient Hospital Stay: Payer: 59

## 2021-08-01 ENCOUNTER — Inpatient Hospital Stay (HOSPITAL_BASED_OUTPATIENT_CLINIC_OR_DEPARTMENT_OTHER): Payer: 59 | Admitting: Oncology

## 2021-08-01 ENCOUNTER — Inpatient Hospital Stay: Payer: 59 | Admitting: Dietician

## 2021-08-01 ENCOUNTER — Other Ambulatory Visit: Payer: Self-pay | Admitting: *Deleted

## 2021-08-01 ENCOUNTER — Other Ambulatory Visit: Payer: Self-pay

## 2021-08-01 VITALS — BP 136/86 | HR 99 | Temp 97.7°F | Resp 16 | Ht 66.0 in | Wt 144.9 lb

## 2021-08-01 DIAGNOSIS — C50311 Malignant neoplasm of lower-inner quadrant of right female breast: Secondary | ICD-10-CM

## 2021-08-01 DIAGNOSIS — Z5112 Encounter for antineoplastic immunotherapy: Secondary | ICD-10-CM | POA: Diagnosis present

## 2021-08-01 DIAGNOSIS — Z170 Estrogen receptor positive status [ER+]: Secondary | ICD-10-CM

## 2021-08-01 DIAGNOSIS — C773 Secondary and unspecified malignant neoplasm of axilla and upper limb lymph nodes: Secondary | ICD-10-CM | POA: Insufficient documentation

## 2021-08-01 DIAGNOSIS — Z79899 Other long term (current) drug therapy: Secondary | ICD-10-CM | POA: Insufficient documentation

## 2021-08-01 DIAGNOSIS — Z171 Estrogen receptor negative status [ER-]: Secondary | ICD-10-CM | POA: Diagnosis not present

## 2021-08-01 DIAGNOSIS — Z95828 Presence of other vascular implants and grafts: Secondary | ICD-10-CM

## 2021-08-01 DIAGNOSIS — Z510 Encounter for antineoplastic radiation therapy: Secondary | ICD-10-CM | POA: Diagnosis present

## 2021-08-01 LAB — CBC WITH DIFFERENTIAL (CANCER CENTER ONLY)
Abs Immature Granulocytes: 0 10*3/uL (ref 0.00–0.07)
Basophils Absolute: 0 10*3/uL (ref 0.0–0.1)
Basophils Relative: 0 %
Eosinophils Absolute: 0.2 10*3/uL (ref 0.0–0.5)
Eosinophils Relative: 5 %
HCT: 39.1 % (ref 36.0–46.0)
Hemoglobin: 12.5 g/dL (ref 12.0–15.0)
Immature Granulocytes: 0 %
Lymphocytes Relative: 23 %
Lymphs Abs: 1 10*3/uL (ref 0.7–4.0)
MCH: 26.9 pg (ref 26.0–34.0)
MCHC: 32 g/dL (ref 30.0–36.0)
MCV: 84.1 fL (ref 80.0–100.0)
Monocytes Absolute: 0.3 10*3/uL (ref 0.1–1.0)
Monocytes Relative: 8 %
Neutro Abs: 2.7 10*3/uL (ref 1.7–7.7)
Neutrophils Relative %: 64 %
Platelet Count: 249 10*3/uL (ref 150–400)
RBC: 4.65 MIL/uL (ref 3.87–5.11)
RDW: 12.7 % (ref 11.5–15.5)
WBC Count: 4.2 10*3/uL (ref 4.0–10.5)
nRBC: 0 % (ref 0.0–0.2)

## 2021-08-01 LAB — CMP (CANCER CENTER ONLY)
ALT: 31 U/L (ref 0–44)
AST: 29 U/L (ref 15–41)
Albumin: 4.1 g/dL (ref 3.5–5.0)
Alkaline Phosphatase: 95 U/L (ref 38–126)
Anion gap: 12 (ref 5–15)
BUN: 13 mg/dL (ref 6–20)
CO2: 22 mmol/L (ref 22–32)
Calcium: 9.5 mg/dL (ref 8.9–10.3)
Chloride: 104 mmol/L (ref 98–111)
Creatinine: 0.73 mg/dL (ref 0.44–1.00)
GFR, Estimated: >60 mL/min (ref >60)
Glucose, Bld: 281 mg/dL — ABNORMAL HIGH (ref 70–99)
Potassium: 3.9 mmol/L (ref 3.5–5.1)
Sodium: 138 mmol/L (ref 135–145)
Total Bilirubin: 0.3 mg/dL (ref 0.3–1.2)
Total Protein: 7.5 g/dL (ref 6.5–8.1)

## 2021-08-01 LAB — TSH: TSH: 0.93 u[IU]/mL (ref 0.308–3.960)

## 2021-08-01 MED ORDER — ANTICOAGULANT SODIUM CITRATE LOCK FLUSH 4% (120 MG/3ML) IV SOLN
5.0000 mL | PREFILLED_SYRINGE | Freq: Once | INTRAVENOUS | Status: DC
  Filled 2021-08-01: qty 6

## 2021-08-01 MED ORDER — HEPARIN SOD (PORK) LOCK FLUSH 100 UNIT/ML IV SOLN
500.0000 [IU] | Freq: Once | INTRAVENOUS | Status: AC | PRN
  Administered 2021-08-01: 500 [IU]

## 2021-08-01 MED ORDER — SODIUM CHLORIDE 0.9 % IV SOLN: Freq: Once | INTRAVENOUS | Status: AC

## 2021-08-01 MED ORDER — SODIUM CHLORIDE 0.9% FLUSH
10.0000 mL | INTRAVENOUS | Status: DC | PRN
  Administered 2021-08-01: 10 mL

## 2021-08-01 MED ORDER — SODIUM CHLORIDE 0.9 % IV SOLN
200.0000 mg | Freq: Once | INTRAVENOUS | Status: AC
  Administered 2021-08-01: 200 mg via INTRAVENOUS
  Filled 2021-08-01: qty 8

## 2021-08-01 MED ORDER — SODIUM CHLORIDE 0.9% FLUSH
10.0000 mL | Freq: Once | INTRAVENOUS | Status: AC
  Administered 2021-08-01: 10 mL

## 2021-08-01 NOTE — Patient Instructions (Signed)
[facility] MEDICAL [facility]COLOG[facility]   Discharge Instructions: Thank you for choosing [facility] to provide your oncology and hematology care.   If you have a lab appointment with the Cancer Center, please go directly to the Cancer Center and check in at the registration area.   Wear comfortable clothing and clothing appropriate for easy access to any Portacath or PICC line.   We strive to give you quality time with your provider. You may need to reschedule your appointment if you arrive late (15 or more minutes).  Arriving late affects you and other patients whose appointments are after yours.  Also, if you miss three or more appointments without notifying the office, you may be dismissed from the clinic at the provider's discretion.      For prescription refill requests, have your pharmacy contact our office and allow 72 hours for refills to be completed.    Today you received the following chemotherapy and/or immunotherapy agents: Pembrolizumab.      To help prevent nausea and vomiting after your treatment, we encourage you to take your nausea medication as directed.  BELOW ARE SYMPTOMS THAT SHOULD BE REPORTED IMMEDIATELY: *FEVER GREATER THAN 100.4 F (38 C) OR HIGHER *CHILLS OR SWEATING *NAUSEA AND VOMITING THAT IS NOT CONTROLLED WITH YOUR NAUSEA MEDICATION *UNUSUAL SHORTNESS OF BREATH *UNUSUAL BRUISING OR BLEEDING *URINARY PROBLEMS (pain or burning when urinating, or frequent urination) *BOWEL PROBLEMS (unusual diarrhea, constipation, pain near the anus) TENDERNESS IN MOUTH AND THROAT WITH OR WITHOUT PRESENCE OF ULCERS (sore throat, sores in mouth, or a toothache) UNUSUAL RASH, SWELLING OR PAIN  UNUSUAL VAGINAL DISCHARGE OR ITCHING   Items with * indicate a potential emergency and should be followed up as soon as possible or go to the Emergency Department if any problems should occur.  Please show the CHEMOTHERAPY ALERT CARD or IMMUNOTHERAPY ALERT CARD at  check-in to the Emergency Department and triage nurse.  Should you have questions after your visit or need to cancel or reschedule your appointment, please contact [facility]  Dept: [phone]  and follow the prompts.  Office hours are 8:00 a.m. to 4:30 p.m. Monday - Friday. Please note that voicemails left after 4:00 p.m. may not be returned until the following business day.  We are closed weekends and major holidays. You have access to a nurse at all times for urgent questions. Please call the main number to the clinic Dept: [phone] and follow the prompts.   For any non-urgent questions, you may also contact your provider using MyChart. We now offer e-Visits for anyone 18 and older to request care online for non-urgent symptoms. For details visit [email or web address].   Also download the MyChart app! Go to the app store, search "MyChart", open the app, select , and log in with your MyChart username and password.  Due to Covid, a mask is required upon entering the hospital/clinic. If you do not have a mask, one will be given to you upon arrival. For doctor visits, patients may have 1 support person aged 18 or older with them. For treatment visits, patients cannot have anyone with them due to current Covid guidelines and our immunocompromised population.   

## 2021-08-01 NOTE — Progress Notes (Signed)
Nutrition Follow-up:  Patient receiving pembrolizumab for breast cancer. She is s/p right lumpectomy on 9/20. Pathology showed no residual carcinoma.   Met with patient and interpretor in infusion. She reports recovering well after surgery, endorses some lingering soreness at incision site. Patient reports good appetite, states "I love the sugar" and eats too much. She has not been drinking Ensure supplements. Patient asking if she should start drinking these again. Patient denies nutrition impact symptoms.   Medications: reviewed   Labs: Glucose 281  Anthropometrics: Weight 144 lb 14.4 oz today stable     NUTRITION DIAGNOSIS: Unintentional weight loss stable  INTERVENTION:  Reviewed strategies for blood sugar management - pt given additional handout Discussed not skipping meals and snacks in between with focus on protein Reviewed foods with protein and importance to support post operative healing Encouraged drinking Ensure as needed to supplement with decreased appetite/intake Patient has contact information, encouraged to call with nutrition questions or concerns  MONITORING, EVALUATION, GOAL:  Weight trends, intake  NEXT VISIT: None scheduled at this time, patient has contact information

## 2021-08-07 NOTE — Progress Notes (Signed)
Radiation Oncology         [phone]) [phone] ________________________________  Name: Rebecca [name] MRN: [id number]  Date: 08/08/2021  DOB: [birth year]  Follow-Up Visit Note  Outpatient  CC: [name], MD  Magrinat, Virgie Dad, MD  Diagnosis:      ICD-10-CM   1. Malignant neoplasm of lower-inner quadrant of right breast of female, estrogen receptor positive ([place])  C50.311    Z17.0       ypT0 ypN0  Cancer Staging Malignant neoplasm of lower-inner quadrant of right breast of female, estrogen receptor positive ([place]) Staging form: Breast, AJCC 8th Edition - Clinical: Stage IIIB (cT2, cN1, cM0, G3, ER-, PR-, HER2-) - Signed by Chauncey Cruel, MD on 01/18/2021 Stage prefix: Initial diagnosis Histologic grading system: 3 grade system   CHIEF COMPLAINT: Here to discuss management of right breast cancer  Narrative:  The patient returns today for follow-up.     Since breast clinic consultation date on 01/18/21, she underwent the following imaging (dates and results as follows):  --Chest CT with contrast on 01/27/21 demonstrated the moderately enlarged right axillary lymph node with internal biopsy clip, compatible with right axillary nodal metastasis. No additional sites of metastatic disease within the chest were seen. Of note: some mild patchy subpleural ground-glass opacity and reticulation at both lung apices were seen, favoring postinfectious scarring. --Bilateral breast MRI on 01/30/21 demonstrated two adjacent lower right breast masses, spanning an area measuring 2.1 x 2.3 x 1.5 cm, compatible with biopsy-proven malignancy. No other suspicious right breast abnormalities were noted. The known single right axillary lymph node was again seen, compatible with biopsy-proven mets. No evidence of left breast malignancy was seen. --Whole body bone scan on 02/01/21 showed no scintigraphic evidence of osseous metastatic disease. --Bilateral breast MRI (post neoadjuvant therapy) on  05/25/21 demonstrated an excellent response to chemotherapy, with complete interval imaging resolution of the patient's known right breast malignancy. Interval improvement in size was also seen of the previously enlarged right axillary lymph node. Again, no evidence of malignancy was seen in the left breast.  Genetic testing collected on 01/18/21 was negative, revealing no clinically significant variants detected.   Her baseline echocardiogram on 01/31/2021 showed an ejection fraction of 60-65%.  Systemic therapy, if applicable, involved (dates and therapy as follows): chemotherapy consisting of pembrolizumab, paclitaxel and carboplatin for 4 cycles under the care of Dr. Jana Hakim; first infusion on 02/07/21. Patient was switched to Doxorubicin, Cylophosphamide and Pembrolizumab on 03/22/2021 due to developing neuropathy. Cycle 4 of treatment completed on 06/13/21. Following cycle 4, she was continued on Keytruda, first dose on 06/13/21.  Right breast lumpectomy with nodal biopsies on date of 07/11/21 revealed: histology of no residual carcinoma; nodal status of 3/3 right axillary sentinel lymph node biopsies negative for carcinoma, and one right axillary nodal biopsy with fibrosis and pseudocyst formation ;  ER status: 10% positive, with weak staining intensity; PR status negative, Her2 status not assessed; Ki67 90%.  Symptomatically, the patient reports:  Lymphedema issues, if any:  Reports right breast feels more swollen/heavy than left. She is waiting to be contacted by PT for evaluation/assessment    Pain issues, if any:  Reports intermittent pain/burning/tightness to her breast around surgical site   SAFETY ISSUES: Prior radiation? No Pacemaker/ICD? No Possible current pregnancy?  Denies sexual activity (LMP 06/2020 per patient) Is the patient on methotrexate? No  Current Complaints / other details:  Nothing else of note           ALLERGIES:  is allergic to lisinopril.  Meds: Current  Outpatient Medications  Medication Sig Dispense Refill   amLODipine (NORVASC) 10 MG tablet TAKE 1 TABLET BY MOUTH EVERY DAY 90 tablet 0   Ascorbic Acid (VITAMIN C) 1000 MG tablet Take 1 tablet (1,000 mg total) by mouth daily. 30 tablet 0   atorvastatin (LIPITOR) 80 MG tablet TAKE 1 TABLET BY MOUTH EVERY DAY 90 tablet 1   Blood Glucose Monitoring Suppl (ONETOUCH VERIO REFLECT) w/Device KIT 1 kit by Does not apply route in the morning, at noon, and at bedtime. Use as directed to test blood sugar up to three times daily.E11.9 1 kit 0   cetirizine (ZYRTEC) 10 MG tablet Take 1 tablet (10 mg total) by mouth daily. 30 tablet 0   cloNIDine (CATAPRES) 0.1 MG tablet Take 1 tablet (0.1 mg total) by mouth at bedtime as needed. 90 tablet 1   clotrimazole (CLOTRIMAZOLE AF) 1 % cream Apply 1 application topically 2 (two) times daily. 30 g 1   cyclobenzaprine (FLEXERIL) 10 MG tablet Take 10 mg by mouth 3 (three) times daily as needed for muscle spasms.     dapagliflozin propanediol (FARXIGA) 10 MG TABS tablet Take 1 tablet (10 mg total) by mouth daily with breakfast. 90 tablet 1   ergocalciferol (DRISDOL) 1.25 MG (50000 UT) capsule Take 1 capsule (50,000 Units total) by mouth once a week. 12 capsule 0   gabapentin (NEURONTIN) 300 MG capsule Take 1 capsule (300 mg total) by mouth 2 (two) times daily. 60 capsule 3   glipiZIDE (GLUCOTROL) 5 MG tablet TAKE 1 TABLET BY MOUTH DAILY 90 tablet 1   glucose blood (ONETOUCH VERIO) test strip Use as directed to test blood sugar up to three times daily. E11.9 100 each 6   HYDROcodone-acetaminophen (NORCO/VICODIN) 5-325 MG tablet Take 1 tablet by mouth every 6 (six) hours as needed for moderate pain. 15 tablet 0   hydrOXYzine (ATARAX/VISTARIL) 25 MG tablet Take 1 tablet (25 mg total) by mouth 3 (three) times daily as needed. 60 tablet 1   ibuprofen (ADVIL) 800 MG tablet Take 1 tablet (800 mg total) by mouth every 8 (eight) hours as needed. 30 tablet 0   labetalol (NORMODYNE)  100 MG tablet Take 100 mg by mouth 2 (two) times daily.     Lancets (ONETOUCH DELICA PLUS OVFIEP32R) MISC 1 Device by Does not apply route in the morning, at noon, and at bedtime. Use as directed to test blood sugar up to three times daily. E11.9 100 each 6   lidocaine-prilocaine (EMLA) cream APPLY TO AFFECTED AREA AS DIRECTED 30 g 3   metFORMIN (GLUCOPHAGE) 500 MG tablet Take 2 tablets (1,000 mg total) by mouth 2 (two) times daily. 180 tablet 1   naproxen (NAPROSYN) 500 MG tablet Take 1 tablet (500 mg total) by mouth 2 (two) times daily with a meal. 30 tablet 1   Omega-3 1000 MG CAPS Take 1 capsule (1,000 mg total) by mouth daily. 30 capsule 0   omeprazole (PRILOSEC) 20 MG capsule Take 1 capsule (20 mg total) by mouth daily. 30 capsule 0   polyethylene glycol (MIRALAX / GLYCOLAX) 17 g packet TAKE 17 G BY MOUTH DAILY. 14 each 0   valACYclovir (VALTREX) 500 MG tablet Take 1 tablet (500 mg total) by mouth daily. 30 tablet 0   vitamin B-12 (CYANOCOBALAMIN) 100 MCG tablet Take 1 tablet (100 mcg total) by mouth daily. 30 tablet 0   No current facility-administered medications for this encounter.  Physical Findings:  weight is 144 lb 2 oz (65.4 kg). Her temperature is 97.7 F (36.5 C). Her blood pressure is 131/86 and her pulse is 86. Her respiration is 19 and oxygen saturation is 100%. .     General: Alert and oriented, in no acute distress HEENT: Head is normocephalic. Extraocular movements are intact.  Psychiatric: Judgment and insight are intact. Affect is appropriate. Breast exam reveals satisfactory healing of right axillary and right lumpectomy scars.  The right lumpectomy scar at the IM fold has not completely closed along the superficial skin but there is no ooze or bleeding - it is about 95% healed/closed.  In my clinical judgment the patient is ready for treatment planning and can start treatment next week.  Lab Findings: Lab Results  Component Value Date   WBC 4.2 08/01/2021   HGB  12.5 08/01/2021   HCT 39.1 08/01/2021   MCV 84.1 08/01/2021   PLT 249 08/01/2021      Radiographic Findings: NM Sentinel Node Inj-No Rpt (Breast)  Result Date: 07/11/2021 Sulfur Colloid was injected by the Nuclear Medicine Technologist for sentinel lymph node localization.   MM BREAST SURGICAL SPECIMEN  Result Date: 07/11/2021 CLINICAL DATA:  Evaluate surgical specimens of the RIGHT breast and axilla following lumpectomy for breast cancer and excision of metastatic RIGHT axillary lymph node. EXAM: SPECIMEN RADIOGRAPH OF THE RIGHT BREAST SPECIMEN RADIOGRAPH OF THE RIGHT AXILLA COMPARISON:  Previous exam(s). FINDINGS: Status post excision of the RIGHT breast and RIGHT axilla. The radioactive seed and RIBBON biopsy marker clip are present and completely intact. The radioactive seed and TRIBELL biopsy marker clip are present and completely intact. IMPRESSION: Specimen radiograph of the RIGHT breast and RIGHT axilla. Electronically Signed   By: Margarette Canada M.D.   On: 07/11/2021 08:45  MM Breast Surgical Specimen  Result Date: 07/11/2021 CLINICAL DATA:  Evaluate surgical specimens of the RIGHT breast and axilla following lumpectomy for breast cancer and excision of metastatic RIGHT axillary lymph node. EXAM: SPECIMEN RADIOGRAPH OF THE RIGHT BREAST SPECIMEN RADIOGRAPH OF THE RIGHT AXILLA COMPARISON:  Previous exam(s). FINDINGS: Status post excision of the RIGHT breast and RIGHT axilla. The radioactive seed and RIBBON biopsy marker clip are present and completely intact. The radioactive seed and TRIBELL biopsy marker clip are present and completely intact. IMPRESSION: Specimen radiograph of the RIGHT breast and RIGHT axilla. Electronically Signed   By: Margarette Canada M.D.   On: 07/11/2021 08:45  MM RT RADIOACTIVE SEED LOC MAMMO GUIDE  Result Date: 07/10/2021 CLINICAL DATA:  Patient presents for localization of a right breast carcinoma and a metastatic right axillary lymph node prior to surgical excision.  Breast lesion localized with mammographic guidance and axillary lymph node localized with sonographic guidance. EXAM: ULTRASOUND GUIDED RADIOACTIVE SEED LOCALIZATION OF THE RIGHT AXILLA MAMMOGRAPHIC GUIDED RADIOACTIVE SEED LOCALIZATION OF THE RIGHT BREAST COMPARISON:  Previous exam(s). FINDINGS: Patient presents for radioactive seed localization prior to . I met with the patient and we discussed the procedure of seed localization including benefits and alternatives. We discussed the high likelihood of a successful procedure. We discussed the risks of the procedure including infection, bleeding, tissue injury and further surgery. We discussed the low dose of radioactivity involved in the procedure. Informed, written consent was given. The usual time-out protocol was performed immediately prior to the procedure. Metastatic right axillary lymph node Using ultrasound guidance, sterile technique, 1% lidocaine and an I-125 radioactive seed, the metastatic right axillary lymph node and associated Tribell marker clip were localized  using a inferior approach. The lymph node could not be visualized mammographically, but the seed was well seen deploying within the node on sonography. Follow-up survey of the patient confirms presence of the radioactive seed. Order number of I-125 seed:  [id number]. Total activity:  3.875 millicuries reference Date: 07/06/2021 Right breast carcinoma Using mammographic guidance, sterile technique, 1% lidocaine and an I-125 radioactive seed, the ribbon shaped biopsy clip was localized using a medial approach. The follow-up mammogram images confirm the seed in the expected location and were marked for Dr. Brantley Stage. Follow-up survey of the patient confirms the presence of a radioactive seed within the inferior breast. Order number of I-125 seed:  [id number]. Total activity:  8.416 millicuries reference Date: 06/13/2021 The patient tolerated the procedure well and was released from the [place].  She was given instructions regarding seed removal. 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 No apparent complications. Electronically Signed   By: Lajean Manes M.D.   On: 07/10/2021 14:02  [place] RT RADIOACTIVE SEED LOC  Result Date: 07/10/2021 CLINICAL DATA:  Patient presents for localization of a right breast carcinoma and a metastatic right axillary lymph node prior to surgical excision. Breast lesion localized with mammographic guidance and axillary lymph node localized with sonographic guidance. EXAM: ULTRASOUND GUIDED RADIOACTIVE SEED LOCALIZATION OF THE RIGHT AXILLA MAMMOGRAPHIC GUIDED RADIOACTIVE SEED LOCALIZATION OF THE RIGHT BREAST COMPARISON:  Previous exam(s). FINDINGS: Patient presents for radioactive seed localization prior to . I met with the patient and we discussed the procedure of seed localization including benefits and alternatives. We discussed the high likelihood of a successful procedure. We discussed the risks of the procedure including infection, bleeding, tissue injury and further surgery. We discussed the low dose of radioactivity involved in the procedure. Informed, written consent was given. The usual time-out protocol was performed immediately prior to the procedure. Metastatic right axillary lymph node Using ultrasound guidance, sterile technique, 1% lidocaine and an I-125 radioactive seed, the metastatic right axillary lymph node and associated Tribell marker clip were localized using a inferior approach. The lymph node could not be visualized mammographically, but the seed was well seen deploying within the node on sonography. Follow-up survey of the patient confirms presence of the radioactive seed. Order number of I-125 seed:  [id number]. Total activity:  0.932 millicuries reference Date: 07/06/2021 Right breast carcinoma Using mammographic guidance, sterile technique, 1% lidocaine and an I-125 radioactive seed, the ribbon shaped biopsy clip  was localized using a medial approach. The follow-up mammogram images confirm the seed in the expected location and were marked for Dr. Brantley Stage. Follow-up survey of the patient confirms the presence of a radioactive seed within the inferior breast. Order number of I-125 seed:  [id number]. Total activity:  5.427 millicuries reference Date: 06/13/2021 The patient tolerated the procedure well and was released from the [place]. She was given instructions regarding seed removal. 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 No apparent complications. Electronically Signed   By: Lajean Manes M.D.   On: 07/10/2021 14:02   Impression/Plan: We discussed adjuvant radiotherapy today.  I recommend radiation therapy in the right breast and regional nodes in order to reduce risk of local regional recurrence by two thirds.  She understands that even though she had a complete response to chemotherapy that there is still risk of relapse in the surgical fields and given her young age radiation is advisable to have the best possible prognosis.  I reviewed the logistics, benefits,  risks, and potential side effects of this treatment in detail. Risks may include but not necessary be limited to acute and late injury tissue in the radiation fields such as skin irritation (change in color/pigmentation, itching, dryness, pain, peeling). She may experience fatigue and /or long term cosmetic changes or scar tissue. There is also a smaller risk for lung toxicity,  brachial plexopathy, lymphedema, musculoskeletal changes, rib fragility or induction of a second malignancy, late chronic non-healing soft tissue wound.    The patient asked good questions which I answered to her satisfaction. She is enthusiastic about proceeding with treatment. A consent form has been signed and placed in her chart.  Proceed with urine pregnancy test today and CT simulation.  We will start treatment in a week.  On date  of service, in total, I spent 30 minutes on this encounter. Patient was seen in person.  _____________________________________   Eppie Gibson, MD  This document serves as a record of services personally performed by Eppie Gibson, MD. It was created on her behalf by Roney Mans, a trained medical scribe. The creation of this record is based on the scribe's personal observations and the provider's statements to them. This document has been checked and approved by the attending provider.

## 2021-08-07 NOTE — Progress Notes (Signed)
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 BREAST, RIGHT, LUMPECTOMY:  - No residual carcinoma identified.  - Focal dense fibrosis with histiocytic infiltration.  B. BREAST, RIGHT ADDITIONAL MEDIAL-INFERIOR MARGIN, EXCISION:  - Negative for carcinoma.  C. BREAST, RIGHT ADDITIONAL SUPERIOR-POSTERIOR MARGIN, EXCISION:  - Negative for carcinoma.  D. BREAST, RIGHT ADDITIONAL ANTERIOR-LATERAL MARGIN, EXCISION:  - Negative for carcinoma.  E. LYMPH NODE, RIGHT AXILLARY, SENTINEL, EXCISION:  - Lymph node with fibrosis and pseudocyst formation.  No carcinoma identified.  F. LYMPH NODE, RIGHT AXILLARY, SENTINEL, EXCISION:  - Negative for carcinoma in one lymph node.  G. LYMPH NODE, RIGHT AXILLARY, SENTINEL, EXCISION:  - Negative for carcinoma in one lymph node.   Receptor Status: ER(10%), PR (0%), Her2-neu (Negative via FISH), Ki-67(90%)  Did patient present with symptoms (if so, please note symptoms) or was this found on screening mammography?:  Patient palpated a mass in the right axilla. She underwent bilateral diagnostic mammography with tomography and right breast ultrasonography at [facility] on 01/03/2021 showing: breast density category B; two adjacent suspicious right breast masses at 5 o'clock; markedly abnormal right axillary lymph node; no evidence of malignancy on left  Past/Anticipated interventions by surgeon, if any:  07/11/2021 Dr. Marcello Moores [place] Right breast seed localized lumpectomy Right axillary lymph node biopsy with seed localization Right axillary sentinel lymph node mapping using mag trace and Lymphoseek  Past/Anticipated interventions by medical oncology, if any:  Under care of Dr. Sarajane Jews Magrinat --08/01/2021 neoadjuvant chemotherapy consisting of pembrolizumab given day 1 together with carboplatin and  paclitaxel, with Taxol given on day 8 and 15, and filgrastim on days 16,17 and 18, started 02/07/2021, repeated every 21 days x 2, followed by doxorubicin and cyclophosphamide every 21 days x 4 also given with pembrolizumab on day 1 and PEG filgrastim day 3, started 03/22/2021 and completed 05/23/2021 carboplatin/paclitaxel discontinued after the cycle 2-day 8 dose with neuropathy developing (subsequently resolved) pembrolizumab continuing to total 1 year right lumpectomy and sentinel lymph node sampling 07/11/2021 showed a complete pathologic response (ypT0 ypN0) --PLAN Joniya did very well with her surgery and I gave her a copy of the pathology for her own records.  She understands there was no residual cancer and this is the best possible response to the treatment she received. She receives [place] today and will continue that through April.  She has tolerated that well.  We will continue to follow her thyroid studies. She does have discomfort in the right armpit and some range of motion issues.  She has already been referred to rehab.  There is fortunately no lymphedema. She will see radiation oncology and likely will start her radiation treatments next week. I will see her again 09/12/2021.  She knows to call us for any other issue that may develop before then   Lymphedema issues, if any:  Reports right breast feels more swollen/heavy than left. She is waiting to be contacted by PT for evaluation/assessment    Pain issues, if any:  Reports intermittent pain/burning/tightness to her breast around surgical site   SAFETY ISSUES: Prior radiation? No Pacemaker/ICD? No Possible current pregnancy? No--postmenopausal (LMP 06/2020 per patient) Is the patient on methotrexate? No  Current Complaints / other details:  Nothing else of note

## 2021-08-08 ENCOUNTER — Ambulatory Visit
Admission: RE | Admit: 2021-08-08 | Discharge: 2021-08-08 | Disposition: A | Discharge status: Home / Self Care | Payer: 59 | Source: Ambulatory Visit | Attending: Radiation Oncology | Admitting: Radiation Oncology

## 2021-08-08 ENCOUNTER — Ambulatory Visit: Payer: 59

## 2021-08-08 ENCOUNTER — Other Ambulatory Visit: Payer: Self-pay

## 2021-08-08 ENCOUNTER — Encounter: Payer: Self-pay | Admitting: Radiation Oncology

## 2021-08-08 ENCOUNTER — Other Ambulatory Visit: Payer: Self-pay | Admitting: Family Medicine

## 2021-08-08 VITALS — BP 131/86 | HR 86 | Temp 97.7°F | Resp 19 | Wt 144.1 lb

## 2021-08-08 DIAGNOSIS — Z7984 Long term (current) use of oral hypoglycemic drugs: Secondary | ICD-10-CM | POA: Diagnosis not present

## 2021-08-08 DIAGNOSIS — R918 Other nonspecific abnormal finding of lung field: Secondary | ICD-10-CM | POA: Diagnosis not present

## 2021-08-08 DIAGNOSIS — C773 Secondary and unspecified malignant neoplasm of axilla and upper limb lymph nodes: Secondary | ICD-10-CM | POA: Insufficient documentation

## 2021-08-08 DIAGNOSIS — Z170 Estrogen receptor positive status [ER+]: Secondary | ICD-10-CM

## 2021-08-08 DIAGNOSIS — C50311 Malignant neoplasm of lower-inner quadrant of right female breast: Secondary | ICD-10-CM | POA: Insufficient documentation

## 2021-08-08 DIAGNOSIS — E119 Type 2 diabetes mellitus without complications: Secondary | ICD-10-CM

## 2021-08-08 LAB — PREGNANCY, URINE: Preg Test, Ur: NEGATIVE

## 2021-08-08 LAB — TSH: TSH: 1.176 u[IU]/mL (ref 0.308–3.960)

## 2021-08-08 NOTE — Telephone Encounter (Signed)
Requested Prescriptions  Pending Prescriptions Disp Refills  . glipiZIDE (GLUCOTROL) 5 MG tablet [Pharmacy Med Name: GLIPIZIDE 5MG  TABLETS] 90 tablet 1    Sig: TAKE 1 TABLET BY MOUTH DAILY     Endocrinology:  Diabetes - Sulfonylureas Passed - 08/08/2021  3:34 AM      Passed - HBA1C is between 0 and 7.9 and within 180 days    HbA1c, POC (controlled diabetic range)  Date Value Ref Range Status  06/29/2021 7.5 (A) 0.0 - 7.0 % Final         Passed - Valid encounter within last 6 months    Recent Outpatient Visits          1 month ago Type 2 diabetes mellitus without complication, without long-term current use of insulin ([place])   [place], [name], MD   7 months ago Annual physical exam   [place], [place], MD   9 months ago Vasomotor symptoms due to menopause   [place], [name], MD   1 year ago Type 2 diabetes mellitus without complication, without long-term current use of insulin [facility])   [place], Annie Main L, RPH-CPP   1 year ago Type 2 diabetes mellitus without complication, without long-term current use of insulin [facility])   [place], RPH-CPP

## 2021-08-10 ENCOUNTER — Ambulatory Visit: Payer: 59 | Attending: Surgery | Admitting: Physical Therapy

## 2021-08-10 ENCOUNTER — Other Ambulatory Visit: Payer: Self-pay

## 2021-08-10 ENCOUNTER — Encounter: Payer: Self-pay | Admitting: Physical Therapy

## 2021-08-10 DIAGNOSIS — Z5112 Encounter for antineoplastic immunotherapy: Secondary | ICD-10-CM | POA: Diagnosis not present

## 2021-08-10 DIAGNOSIS — Z170 Estrogen receptor positive status [ER+]: Secondary | ICD-10-CM

## 2021-08-10 DIAGNOSIS — R293 Abnormal posture: Secondary | ICD-10-CM | POA: Diagnosis present

## 2021-08-10 DIAGNOSIS — Z483 Aftercare following surgery for neoplasm: Secondary | ICD-10-CM | POA: Insufficient documentation

## 2021-08-10 DIAGNOSIS — C50311 Malignant neoplasm of lower-inner quadrant of right female breast: Secondary | ICD-10-CM | POA: Insufficient documentation

## 2021-08-10 NOTE — Therapy (Signed)
[facility] @ [place], [place], [address] Phone: [phone]   Fax:  [phone]  Physical Therapy Treatment  Patient Details  Name: Rebecca [name] MRN: [id number] Date of Birth: December 05, 1965 Referring Provider (PT): Dr. Erroll Luna   Encounter Date: 08/10/2021   PT End of Session - 08/10/21 1410     Visit Number 2    Number of Visits 2    PT Start Time 1100    PT Stop Time 1324    PT Time Calculation (min) 45 min    Activity Tolerance Patient tolerated treatment well    Behavior During Therapy [facility] for tasks assessed/performed             Past Medical History:  Diagnosis Date   Arthritis    Breast cancer ([place])    Diabetes mellitus    Family history of lymphoma 01/18/2021   Family history of throat cancer 01/18/2021   Hyperlipidemia    Hypertension     Past Surgical History:  Procedure Laterality Date   BREAST LUMPECTOMY WITH RADIOACTIVE SEED AND SENTINEL LYMPH NODE BIOPSY Right 07/11/2021   Procedure: RIGHT BREAST LUMPECTOMY WITH RADIOACTIVE SEED AND RIGHT SENTINEL LYMPH NODE BIOPSY;  Surgeon: Erroll Luna, MD;  Location: [facility];  Service: General;  Laterality: Right;   CESAREAN SECTION     PORTACATH PLACEMENT N/A 01/26/2021   Procedure: INSERTION PORT-A-CATH;  Surgeon: Erroll Luna, MD;  Location: [facility];  Service: General;  Laterality: N/A;  60   RADIOACTIVE SEED GUIDED AXILLARY SENTINEL LYMPH NODE Right 07/11/2021   Procedure: RADIOACTIVE SEED GUIDED RIGHT AXILLARY LYMPH NODE BIOPSY;  Surgeon: Erroll Luna, MD;  Location: [facility];  Service: General;  Laterality: Right;    There were no vitals filed for this visit.   Subjective Assessment - 08/10/21 1102     Subjective Patient reports she underwent neoadjuvant chemotherapy 02/07/2021 - 05/23/2021 followed by a right lumpectomy and sentinel node biopsy on 07/11/2021 with 3 negative nodes removed.  She has upcoming radiation 08/17/2021.    Pertinent History Patient underwent neoadjuvant chemotherapy 02/07/2021 - 05/23/2021 followed by a right lumpectomy and sentinel node biopsy on 07/11/2021 with 3 negative nodes removed for triple negative breast cancer.    Patient Stated Goals See if my arm is ok    Currently in Pain? Yes    Pain Score 6     Pain Location Hand    Pain Orientation Right;Left    Pain Descriptors / Indicators Tingling;Burning    Pain Type Neuropathic pain    Pain Onset More than a month ago    Pain Frequency Intermittent    Aggravating Factors  Trying to hold something    Pain Relieving Factors Not holding things    Effect of Pain on Daily Activities This is chemo induced neuropathy                [facility] PT Assessment - 08/10/21 0001       Assessment   Medical Diagnosis Right breast cancer    Referring Provider (PT) Dr. Marcello Moores [place]    Onset Date/Surgical Date 07/11/21    Hand Dominance Left    Prior Therapy Baselines      Precautions   Precautions Other (comment)    Precaution Comments recent surgery; right arm lymphedema risk      Restrictions   Weight Bearing Restrictions No      Balance Screen   Has the  patient fallen in the past 6 months No    Has the patient had a decrease in activity level because of a fear of falling?  No    Is the patient reluctant to leave their home because of a fear of falling?  No      Home Environment   Living Environment Private residence    Living Arrangements Other relatives   nephew   Available Help at Discharge Family      Prior Function   Level of Independence Independent    Vocation Unemployed    Leisure She is not exercising      Cognition   Overall Cognitive Status Within Functional Limits for tasks assessed      Observation/Other Assessments   Observations Right axillary and inferior breast incisions appear to be well healed but there is a small area on her right lateral breast incision is still  healing. No redness or edema present.      Posture/Postural Control   Posture/Postural Control Postural limitations    Postural Limitations Rounded Shoulders;Forward head      ROM / Strength   AROM / PROM / Strength AROM      AROM   AROM Assessment Site Shoulder    Right/Left Shoulder Right    Right Shoulder Extension 25 Degrees    Right Shoulder Flexion 156 Degrees    Right Shoulder ABduction 170 Degrees    Right Shoulder Internal Rotation 62 Degrees    Right Shoulder External Rotation 82 Degrees               LYMPHEDEMA/ONCOLOGY QUESTIONNAIRE - 08/10/21 0001       Type   Cancer Type Right breast cancer      Surgeries   Lumpectomy Date 07/11/21    Sentinel Lymph Node Biopsy Date 07/11/21    Number Lymph Nodes Removed 3      Treatment   Active Chemotherapy Treatment No    Past Chemotherapy Treatment Yes    Date 05/23/21    Active Radiation Treatment No    Past Radiation Treatment No    Current Hormone Treatment No    Past Hormone Therapy No      What other symptoms do you have   Are you Having Heaviness or Tightness No    Are you having Pain Yes    Are you having pitting edema No    Is it Hard or Difficult finding clothes that fit No    Do you have infections No    Is there Decreased scar mobility Yes    [place] Sign No      Lymphedema Assessments   Lymphedema Assessments Upper extremities      Right Upper Extremity Lymphedema   10 cm Proximal to Olecranon Process 26.9 cm    Olecranon Process 22.9 cm    10 cm Proximal to Ulnar Styloid Process 19.2 cm    Just Proximal to Ulnar Styloid Process 14.7 cm    Across Hand at [employer] 17.5 cm    At [place] of 2nd Digit 6 cm      Left Upper Extremity Lymphedema   10 cm Proximal to Olecranon Process 26.8 cm    Olecranon Process 22.9 cm    10 cm Proximal to Ulnar Styloid Process 19.7 cm    Just Proximal to Ulnar Styloid Process 14.8 cm    Across Hand at [employer] 17.8 cm    At [place] of 2nd Digit 6.1  cm  L-DEX FLOWSHEETS - 08/10/21 1100       L-DEX LYMPHEDEMA SCREENING   Measurement Type Unilateral   Done on 01/18/2021; carrying forward here   L-DEX MEASUREMENT EXTREMITY Upper Extremity    POSITION  Standing    DOMINANT SIDE Left    At Risk Side Right    BASELINE SCORE (UNILATERAL) -0.8                                PT Education - 08/10/21 1410     Education Details Aftercare; scar massage    Person(s) Educated Patient    Methods Explanation;Demonstration;Handout    Comprehension Returned demonstration;Verbalized understanding                 PT Long Term Goals - 08/10/21 1418       PT LONG TERM GOAL #1   Title Patient will demonstrate she has regained full shoulder ROM and function post operatively compared to baselines.    Time 6    Period Months    Status Achieved                   Plan - 08/10/21 1411     Clinical Impression Statement Patient is doing very well s/p right lumpectomy and sentinel node biopsy on 07/11/2021. She inferior brast incision is still healing but does not appear to be an open wound. She has regained full shoulder ROM and function and shows no sign of lymphedema. She was given information today about lymphdeema risk reduction (translated into Arabic) but otherwise has no PT needs at this time. She plans to return for regular SOZO screens.    PT Treatment/Interventions ADLs/Self Care Home Management;Therapeutic exercise;Patient/family education    PT Next Visit Plan D/C    PT Home Exercise Plan Post op shoulder ROM HEP    Consulted and Agree with Plan of Care Patient             Patient will benefit from skilled therapeutic intervention in order to improve the following deficits and impairments:  Postural dysfunction, Impaired UE functional use, Increased edema, Decreased knowledge of precautions  Visit Diagnosis: Malignant neoplasm of lower-inner quadrant of right breast of female,  estrogen receptor positive ([place])  Abnormal posture  Aftercare following surgery for neoplasm     Problem List Patient Active Problem List   Diagnosis Date Noted   Port-A-Cath in place 02/07/2021   Genetic testing 02/02/2021   Family history of throat cancer 01/18/2021   Family history of lymphoma 01/18/2021   Malignant neoplasm of lower-inner quadrant of right breast of female, estrogen receptor positive ([place]) 01/12/2021   Posterior tibial tendinitis, right leg 01/26/2020   Diabetes ([place]) 03/11/2017   Muscle pain 08/06/2013   Tiredness 08/06/2013   [place] vaginitis 08/19/2012   Dental cavities 08/19/2012   Knee pain, bilateral 04/16/2012   Diabetes mellitus type 2, controlled ([place]) 04/16/2012   Hyperlipidemia 04/16/2012   Palpitations 04/16/2012   Back pain 04/16/2012   PHYSICAL THERAPY DISCHARGE SUMMARY  Visits from Start of Care: 2  Current functional level related to goals / functional outcomes: Goals met. See above for objective measurements.   Remaining deficits: None   Education / Equipment: HEP and lymphedema education  Patient agrees to discharge. Patient goals were met. Patient is being discharged due to meeting the stated rehab goals.  Annia Friendly, PT 08/10/21 2:20 PM    [facility] @ [place], [place], [address] Phone: [phone]   Fax:  [phone]  Name: Rebecca [name] MRN: [id number] Date of Birth: 1965/10/28

## 2021-08-10 NOTE — Patient Instructions (Addendum)
     [facility]  [address], [address]  [place] [address]  [phone]  After Breast Cancer Class It is recommended you attend the ABC class to be educated on lymphedema risk reduction. This class is free of charge and lasts for 1 hour. It is a 1-time class. I will give you the information on this instead of doing the class because the class is in [place].  Scar massage You can begin gentle scar massage to your incisions using coconut oil. Avoid the small area that is still healing.  Compression garment You can begin wearing a sports bra if that is more comfortable.  Home exercise Program Continue doing the stretches I gave you at the breast clinic until you finish radiation.  Follow up PT: It is recommended you return every 3 months for the first 3 years following surgery to be assessed on the SOZO machine for an L-Dex score. This helps prevent clinically significant lymphedema in 95% of patients. These follow up screens are 10 minute appointments that you are not billed for. You are scheduled for December 19th at 9:30.   ???? ????? ??? ABC ?????? ????? ???? ?? ????? ?????? ???????. ??? ????? ????? ?????? ???? ????. ???? ??? ???? ?????. ????? ?? ????????? ??? ??? ????? ?? ???? ????? ??? ????? ?????? ??????????.  ????? ??????? ????? ????? ?????? ??????? ???? ?????? ???????? ??? ??? ?????. ???? ??????? ??????? ???? ?? ???? ?????.  ????? ????? ????? ????? ?? ?????? ????? ????? ???????? ??? ??? ??? ???? ????.  ?????? ???????? ???????? ?????? ?? ???? ?????? ??????? ???? ?????? ?? ?? ????? ????? ??? ????? ?? ???????.  ?????? PT: ???? ??????? ?? 3 ???? ???? 3 ????? ??? ??????? ???? ??????? ??? ??? SOZO ?????? ??? ???? L-Dex. ????? ??? ?? ??????? ?? ?????? ??????? ?????? ??????? ?? 95? ?? ??????. ????? ???????? ??? ????? ?? ?????? ????? 10 ????? ?? ??? ??????? ?????. ?? ?????? ?? ???? ??? 19 ????? ????? (??????) ?????? 9:30.

## 2021-08-15 ENCOUNTER — Encounter: Payer: Self-pay | Admitting: Oncology

## 2021-08-15 ENCOUNTER — Encounter: Payer: Self-pay | Admitting: *Deleted

## 2021-08-15 NOTE — Telephone Encounter (Signed)
No entry 

## 2021-08-16 ENCOUNTER — Other Ambulatory Visit: Payer: Self-pay

## 2021-08-16 ENCOUNTER — Ambulatory Visit
Admission: RE | Admit: 2021-08-16 | Discharge: 2021-08-16 | Disposition: A | Discharge status: Home / Self Care | Payer: 59 | Source: Ambulatory Visit | Attending: Radiation Oncology | Admitting: Radiation Oncology

## 2021-08-16 DIAGNOSIS — Z5112 Encounter for antineoplastic immunotherapy: Secondary | ICD-10-CM | POA: Diagnosis not present

## 2021-08-17 ENCOUNTER — Other Ambulatory Visit (HOSPITAL_COMMUNITY): Payer: Self-pay

## 2021-08-17 ENCOUNTER — Encounter: Payer: Self-pay | Admitting: Oncology

## 2021-08-17 ENCOUNTER — Ambulatory Visit
Admission: RE | Admit: 2021-08-17 | Discharge: 2021-08-17 | Disposition: A | Discharge status: Home / Self Care | Payer: 59 | Source: Ambulatory Visit | Attending: Radiation Oncology | Admitting: Radiation Oncology

## 2021-08-17 ENCOUNTER — Other Ambulatory Visit: Payer: Self-pay | Admitting: Family Medicine

## 2021-08-17 DIAGNOSIS — Z5112 Encounter for antineoplastic immunotherapy: Secondary | ICD-10-CM | POA: Diagnosis not present

## 2021-08-17 MED ORDER — DIPHENHYDRAMINE HCL 12.5 MG/5ML PO LIQD
ORAL | 0 refills | Status: DC
  Filled 2021-08-17: qty 240, 8d supply, fill #0

## 2021-08-18 ENCOUNTER — Ambulatory Visit: Payer: 59

## 2021-08-18 ENCOUNTER — Ambulatory Visit
Admission: RE | Admit: 2021-08-18 | Discharge: 2021-08-18 | Disposition: A | Discharge status: Home / Self Care | Payer: 59 | Source: Ambulatory Visit | Attending: Radiation Oncology | Admitting: Radiation Oncology

## 2021-08-18 ENCOUNTER — Other Ambulatory Visit: Payer: Self-pay

## 2021-08-18 DIAGNOSIS — Z5112 Encounter for antineoplastic immunotherapy: Secondary | ICD-10-CM | POA: Diagnosis not present

## 2021-08-18 MED ORDER — MAGIC MOUTHWASH W/LIDOCAINE: 5.0000 mL | ORAL | 0 refills | Status: DC | PRN

## 2021-08-18 NOTE — Progress Notes (Signed)
Pt called and c/o mouth sores. Magic mouthwash w/lidocaine called in to [facility] per MD. Pt aware and verbalized thanks. Knows to call with any concerns.

## 2021-08-18 NOTE — Telephone Encounter (Signed)
Requested Prescriptions  Pending Prescriptions Disp Refills  . amLODipine (NORVASC) 10 MG tablet [Pharmacy Med Name: AMLODIPINE BESYLATE 10MG  TABLETS] 90 tablet 0    Sig: TAKE 1 TABLET BY MOUTH EVERY DAY     Cardiovascular:  Calcium Channel Blockers Passed - 08/17/2021  8:48 PM      Passed - Last BP in normal range    BP Readings from Last 1 Encounters:  08/08/21 131/86         Passed - Valid encounter within last 6 months    Recent Outpatient Visits          1 month ago Type 2 diabetes mellitus without complication, without long-term current use of insulin ([name])   [name], [name], MD   7 months ago Annual physical exam   [place], [place], MD   9 months ago Vasomotor symptoms due to menopause   [place], [name], MD   1 year ago Type 2 diabetes mellitus without complication, without long-term current use of insulin [facility])   [place], Annie Main L, RPH-CPP   1 year ago Type 2 diabetes mellitus without complication, without long-term current use of insulin [facility])   [place], RPH-CPP

## 2021-08-19 ENCOUNTER — Other Ambulatory Visit: Payer: Self-pay | Admitting: Family Medicine

## 2021-08-19 DIAGNOSIS — E119 Type 2 diabetes mellitus without complications: Secondary | ICD-10-CM

## 2021-08-21 ENCOUNTER — Other Ambulatory Visit: Payer: Self-pay | Admitting: *Deleted

## 2021-08-21 ENCOUNTER — Other Ambulatory Visit: Payer: Self-pay

## 2021-08-21 ENCOUNTER — Inpatient Hospital Stay (HOSPITAL_BASED_OUTPATIENT_CLINIC_OR_DEPARTMENT_OTHER): Payer: 59 | Admitting: Oncology

## 2021-08-21 ENCOUNTER — Telehealth: Payer: Self-pay | Admitting: Family Medicine

## 2021-08-21 ENCOUNTER — Ambulatory Visit
Admission: RE | Admit: 2021-08-21 | Discharge: 2021-08-21 | Disposition: A | Discharge status: Home / Self Care | Payer: 59 | Source: Ambulatory Visit | Attending: Radiation Oncology | Admitting: Radiation Oncology

## 2021-08-21 DIAGNOSIS — C50311 Malignant neoplasm of lower-inner quadrant of right female breast: Secondary | ICD-10-CM

## 2021-08-21 DIAGNOSIS — T451X5A Adverse effect of antineoplastic and immunosuppressive drugs, initial encounter: Secondary | ICD-10-CM | POA: Diagnosis not present

## 2021-08-21 DIAGNOSIS — G620 Drug-induced polyneuropathy: Secondary | ICD-10-CM | POA: Diagnosis not present

## 2021-08-21 DIAGNOSIS — Z5112 Encounter for antineoplastic immunotherapy: Secondary | ICD-10-CM | POA: Diagnosis not present

## 2021-08-21 DIAGNOSIS — Z170 Estrogen receptor positive status [ER+]: Secondary | ICD-10-CM

## 2021-08-21 MED ORDER — ALRA NON-METALLIC DEODORANT (RAD-ONC)
1.0000 "application " | Freq: Once | TOPICAL | Status: AC
  Administered 2021-08-21: 1 via TOPICAL

## 2021-08-21 MED ORDER — GABAPENTIN 100 MG PO CAPS: 100.0000 mg | ORAL_CAPSULE | Freq: Two times a day (BID) | ORAL | 6 refills | Status: DC

## 2021-08-21 MED ORDER — RADIAPLEXRX EX GEL: Freq: Once | CUTANEOUS | Status: AC

## 2021-08-21 MED ORDER — GABAPENTIN 300 MG PO CAPS: 300.0000 mg | ORAL_CAPSULE | Freq: Every day | ORAL | 4 refills | Status: DC

## 2021-08-21 NOTE — Progress Notes (Addendum)
Pt here for patient teaching.    Pt given Radiation and You booklet, skin care instructions, Alra deodorant, and Radiaplex gel.  Education explained/reviewed with the help of an interpreter  Reviewed areas of pertinence such as fatigue, hair loss, skin changes, breast tenderness, and breast swelling .   Pt able to give teach back of to pat skin, use unscented/gentle soap, and drink plenty of water,apply Radiaplex bid, avoid applying anything to skin within 4 hours of treatment, avoid wearing an under wire bra, and to use an electric razor if they must shave.   Pt demonstrated understanding and verbalizes understanding of information given and will contact nursing with any questions or concerns.    [email or web address]

## 2021-08-21 NOTE — Progress Notes (Signed)
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  She is accompanied by an interpreter  I added her to my list today because physical therapy picked up on her complaints regarding neuropathy.  She continues on pembrolizumab. We held her last dose for her surgery. She is due for a dose today.  She understands she will continue this treatment through April 2023  We are following her thyroid studies: Results for COLTON, ENGDAHL (MRN [id number]) as of 08/01/2021 07:45  Ref. Range 01/16/2016 10:06 07/12/2019 14:08 02/14/2021 08:49 05/23/2021 07:55 06/13/2021 08:20  TSH Latest Ref Range: 0.308 - 3.960 uIU/mL 2.29 1.361 1.064 1.053 1.204    REVIEW OF SYSTEMS: Rebecca [name] tells me she has problems grabbing things or opening or closing jars or really doing much of anything with her hands.  Her feet hurt particularly at night and sometimes after she has been sitting or lying and then suddenly starts walking.  She is taking Advil for this.  Otherwise she is tolerating radiation well and she has no symptoms related to the pembrolizumab that she is aware of a detailed review of systems is otherwise stable  COVID 19 VACCINATION STATUS: fully vaccinated [employer]), with booster 12/2020   HISTORY OF CURRENT  ILLNESS: From the original intake note:  Rebecca [name] herself palpated a mass in the right axilla. She underwent bilateral diagnostic mammography with tomography and right breast ultrasonography at [facility] on 01/03/2021 showing: breast density category B; two adjacent suspicious right breast masses at 5 Rebecca [name]'clock; markedly abnormal right axillary lymph node; no evidence of malignancy on left.  Accordingly on 01/06/2021 she proceeded to biopsy of the right breast area in question. The pathology from this procedure ([id number]) showed: invasive ductal carcinoma, grade 3. Prognostic indicators significant for: estrogen receptor, 10% positive with weak staining intensity and progesterone receptor, 0% negative. Proliferation marker Ki67 at 90%. HER2 equivocal by immunohistochemistry (2+), but negative by fluorescent in situ hybridization with a signals ratio 1.38 and number per cell 3.63.  Cancer Staging Malignant neoplasm of lower-inner quadrant of right breast of female, estrogen receptor positive ([place]) Staging form: Breast, AJCC 8th Edition - Clinical: Stage IIIB (cT2, cN1, cM0, G3, ER-, PR-, HER2-) - Signed by Rebecca Cruel, MD on 01/18/2021 Stage prefix: Initial diagnosis Histologic grading system: 3 grade system  [name] is as detailed below.   PAST MEDICAL HISTORY: Past Medical History:  Diagnosis Date   Arthritis    Breast cancer ([place])    Diabetes mellitus    Family history of lymphoma 01/18/2021   Family history of throat cancer 01/18/2021   Hyperlipidemia    Hypertension     PAST SURGICAL HISTORY: Past Surgical History:  Procedure Laterality Date   BREAST LUMPECTOMY WITH RADIOACTIVE SEED AND SENTINEL LYMPH NODE BIOPSY Right 07/11/2021   Procedure: RIGHT BREAST LUMPECTOMY WITH RADIOACTIVE SEED AND RIGHT SENTINEL LYMPH  NODE BIOPSY;  Surgeon: Rebecca Luna, MD;  Location: [facility];  Service: General;  Laterality: Right;    CESAREAN SECTION     PORTACATH PLACEMENT N/A 01/26/2021   Procedure: INSERTION PORT-A-CATH;  Surgeon: Rebecca Luna, MD;  Location: [facility];  Service: General;  Laterality: N/A;  Rebecca [name]'Kean Right 07/11/2021   Procedure: RADIOACTIVE SEED GUIDED RIGHT AXILLARY LYMPH NODE BIOPSY;  Surgeon: Rebecca Luna, MD;  Location: [facility];  Service: General;  Laterality: Right;    FAMILY HISTORY: Family History  Problem Relation Age of Onset   Diabetes Mother    Heart disease Mother    Diabetes Father    Throat cancer Father        d. 41s   Diabetes Sister    Diabetes Brother    Lymphoma Cousin        maternal cousin; d. 10s   Her father died in his 73's and her mother in her 70'Y, both from complications of diabetes. Rebecca [name] has 6 brothers and 1 sister. There is no family history of breast or ovarian cancer to her knowledge.   GYNECOLOGIC HISTORY:  No LMP recorded. Patient is perimenopausal. Menarche: 55 years old [place] P 0 LMP 10/31/2020 (before that, 06/2020) Contraceptive used from 2017-2018 HRT n/a  Hysterectomy? no BSO? no   SOCIAL HISTORY: (updated 12/2020)  Rebecca [name] is originally from [place]. 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 She is divorced. She lives at home with her nephew Rebecca [name], who is 52 and works for [employer]. She attends [employer]    ADVANCED DIRECTIVES: not in place; at the 01/18/2021 visit she stated her ex-husband Rebecca [name], who is disabled due to past injuries, [phone]) would be the person to contact if she became incapacitated [the patient states aside from her nephew she has no other relatives in [place]: Social History   Tobacco Use   Smoking status: Never    Passive exposure: Yes   Smokeless tobacco: Never  Vaping Use   Vaping Use: Never used  Substance Use Topics    Alcohol use: No   Drug use: No     Colonoscopy: never done, referral in place  PAP: 12/2020, negative  Bone density: never done   Allergies  Allergen Reactions   Lisinopril Swelling    Swelling in throat, coughing, difficulty swallowing, could not sleep. Occurred Dec 2020.    Current Outpatient Medications  Medication Sig Dispense Refill   amLODipine (NORVASC) 10 MG tablet TAKE 1 TABLET BY MOUTH EVERY DAY 90 tablet 0   Ascorbic Acid (VITAMIN C) 1000 MG tablet Take 1 tablet (1,000 mg total) by mouth daily. 30 tablet 0   atorvastatin (LIPITOR) 80 MG tablet TAKE 1 TABLET BY MOUTH EVERY DAY 90 tablet 1   Blood Glucose Monitoring Suppl (ONETOUCH VERIO REFLECT) w/Device KIT 1 kit by Does not apply route in the morning, at noon, and at bedtime. Use as directed to test blood sugar up to three times daily.E11.9 1 kit 0   cetirizine (ZYRTEC) 10 MG tablet Take 1 tablet (10 mg total) by mouth daily. 30 tablet 0   cloNIDine (CATAPRES) 0.1 MG tablet Take 1 tablet (0.1 mg total) by mouth at bedtime as needed. 90 tablet 1   clotrimazole (CLOTRIMAZOLE AF) 1 % cream Apply 1 application topically 2 (two) times daily. 30 g 1  cyclobenzaprine (FLEXERIL) 10 MG tablet Take 10 mg by mouth 3 (three) times daily as needed for muscle spasms.     dapagliflozin propanediol (FARXIGA) 10 MG TABS tablet Take 1 tablet (10 mg total) by mouth daily with breakfast. 90 tablet 1   ergocalciferol (DRISDOL) 1.25 MG (50000 UT) capsule Take 1 capsule (50,000 Units total) by mouth once a week. 12 capsule 0   gabapentin (NEURONTIN) 300 MG capsule Take 1 capsule (300 mg total) by mouth 2 (two) times daily. 60 capsule 3   glipiZIDE (GLUCOTROL) 5 MG tablet TAKE 1 TABLET BY MOUTH DAILY 90 tablet 1   glucose blood (ONETOUCH VERIO) test strip Use as directed to test blood sugar up to three times daily. E11.9 100 each 6   HYDROcodone-acetaminophen (NORCO/VICODIN) 5-325 MG tablet Take 1 tablet by mouth every 6 (six) hours as needed for  moderate pain. 15 tablet 0   hydrOXYzine (ATARAX/VISTARIL) 25 MG tablet Take 1 tablet (25 mg total) by mouth 3 (three) times daily as needed. 60 tablet 1   ibuprofen (ADVIL) 800 MG tablet Take 1 tablet (800 mg total) by mouth every 8 (eight) hours as needed. 30 tablet 0   labetalol (NORMODYNE) 100 MG tablet Take 100 mg by mouth 2 (two) times daily.     Lancets (ONETOUCH DELICA PLUS [id number]) MISC 1 Device by Does not apply route in the morning, at noon, and at bedtime. Use as directed to test blood sugar up to three times daily. E11.9 100 each 6   lidocaine-prilocaine (EMLA) cream APPLY TO AFFECTED AREA AS DIRECTED 30 g 3   magic mouthwash (lidocaine, diphenhydrAMINE, alum & mag hydroxide) suspension Swish and swallow 5 mls by mouth every 4 hours as needed 240 mL 0   magic mouthwash w/lidocaine SOLN Take 5 mLs by mouth every 4 (four) hours as needed for mouth pain. 240 mL 0   metFORMIN (GLUCOPHAGE) 500 MG tablet TAKE 1 TABLET BY MOUTH TWICE DAILY 180 tablet 1   naproxen (NAPROSYN) 500 MG tablet Take 1 tablet (500 mg total) by mouth 2 (two) times daily with a meal. 30 tablet 1   Omega-3 1000 MG CAPS Take 1 capsule (1,000 mg total) by mouth daily. 30 capsule 0   omeprazole (PRILOSEC) 20 MG capsule Take 1 capsule (20 mg total) by mouth daily. 30 capsule 0   polyethylene glycol (MIRALAX / GLYCOLAX) 17 g packet TAKE 17 G BY MOUTH DAILY. 14 each 0   valACYclovir (VALTREX) 500 MG tablet Take 1 tablet (500 mg total) by mouth daily. 30 tablet 0   vitamin B-12 (CYANOCOBALAMIN) 100 MCG tablet Take 1 tablet (100 mcg total) by mouth daily. 30 tablet 0   No current facility-administered medications for this visit.    OBJECTIVE: Rebecca [name] woman who appears stated age  There were no vitals filed for this visit.    There is no height or weight on file to calculate BMI.   Wt Readings from Last 3 Encounters:  08/08/21 144 lb 2 oz (65.4 kg)  08/01/21 144 lb 14.4 oz (65.7 kg)  07/11/21 145 lb 1 oz (65.8 kg)       ECOG FS:1 - Symptomatic but completely ambulatory    LAB RESULTS:  CMP     Component Value Date/Time   NA 138 08/01/2021 0845   NA 140 12/27/2020 1123   K 3.9 08/01/2021 0845   CL 104 08/01/2021 0845   CO2 22 08/01/2021 0845   GLUCOSE 281 (H) 08/01/2021 0845   BUN  13 08/01/2021 0845   BUN 11 12/27/2020 1123   CREATININE 0.73 08/01/2021 0845   CREATININE 0.50 11/02/2016 1102   CALCIUM 9.5 08/01/2021 0845   PROT 7.5 08/01/2021 0845   PROT 7.5 12/27/2020 1123   ALBUMIN 4.1 08/01/2021 0845   ALBUMIN 4.7 12/27/2020 1123   AST 29 08/01/2021 0845   ALT 31 08/01/2021 0845   ALKPHOS 95 08/01/2021 0845   BILITOT 0.3 08/01/2021 0845   GFRNONAA >60 08/01/2021 0845   GFRNONAA >89 01/16/2016 1006   GFRAA 118 06/02/2020 0942   GFRAA >89 01/16/2016 1006    No results found for: TOTALPROTELP, ALBUMINELP, A1GS, A2GS, BETS, BETA2SER, GAMS, MSPIKE, SPEI  Lab Results  Component Value Date   WBC 4.2 08/01/2021   NEUTROABS 2.7 08/01/2021   HGB 12.5 08/01/2021   HCT 39.1 08/01/2021   MCV 84.1 08/01/2021   PLT 249 08/01/2021    No results found for: LABCA2  No components found for: YQMGNO037  No results for input(s): INR in the last 168 hours.  No results found for: LABCA2  No results found for: CWU889  No results found for: VQX450  No results found for: TUU828  No results found for: CA2729  No components found for: HGQUANT  No results found for: CEA1 / No results found for: CEA1   No results found for: AFPTUMOR  No results found for: CHROMOGRNA  No results found for: KPAFRELGTCHN, LAMBDASER, KAPLAMBRATIO (kappa/lambda light chains)  No results found for: HGBA, HGBA2QUANT, HGBFQUANT, HGBSQUAN (Hemoglobinopathy evaluation)   No results found for: LDH  No results found for: IRON, TIBC, IRONPCTSAT (Iron and TIBC)  No results found for: FERRITIN  Urinalysis    Component Value Date/Time   COLORURINE YELLOW 06/24/2007 0116   APPEARANCEUR CLOUDY (A)  06/24/2007 0116   LABSPEC 1.025 09/18/2020 1125   PHURINE 5.5 09/18/2020 1125   GLUCOSEU 100 (A) 09/18/2020 1125   HGBUR TRACE (A) 09/18/2020 1125   BILIRUBINUR NEGATIVE 09/18/2020 1125   KETONESUR NEGATIVE 09/18/2020 1125   PROTEINUR NEGATIVE 09/18/2020 1125   UROBILINOGEN 0.2 09/18/2020 1125   NITRITE NEGATIVE 09/18/2020 1125   LEUKOCYTESUR NEGATIVE 09/18/2020 1125    STUDIES: No results found.   ELIGIBLE FOR AVAILABLE RESEARCH PROTOCOL: no  ASSESSMENT: 4 y.Rebecca [name]. [place] woman status post right breast lower inner quadrant biopsy 01/06/2021 for a clinically T2 N1, stage IIIB functionally triple negative invasive ductal carcinoma, grade 3, with and MIB-1 of 90%  (a) chest CT scan 01/29/2021 shows no evidence of metastatic disease  (b) bone scan 02/01/2021 shows no evidence of metastatic disease  (1) neoadjuvant chemotherapy consisting of pembrolizumab given day 1 together with carboplatin and paclitaxel, with Taxol given on day 8 and 15, and filgrastim on days 16,17 and 18, started 02/07/2021, repeated every 21 days x 2, followed by doxorubicin and cyclophosphamide every 21 days x 4 also given with pembrolizumab on day 1 and PEG filgrastim day 3, started 03/22/2021 and completed 05/23/2021  (a) carboplatin/paclitaxel discontinued after the cycle 2-day 8 dose with neuropathy developing (subsequently resolved)   (2) pembrolizumab continuing to total 1 year  (3) right lumpectomy and sentinel lymph node sampling 07/11/2021 showed a complete pathologic response (ypT0 ypN0)  (a) a total of 3 right axillary lymph nodes were removed  (4) adjuvant radiation pending  (5) genetics testing 01/31/2021 through the [facility]-Expanded + RNAinsight Panel found no deleterious mutations in AIP, ALK, APC, ATM, AXIN2, BAP1, BARD1, BLM, BMPR1A, BRCA1, BRCA2, BRIP1, CDC73, CDH1, CDK4, CDKN1B, CDKN2A, CHEK2, CTNNA1,  DICER1, FANCC, FH, FLCN, GALNT12, KIF1B, LZTR1, MAX, MEN1, MET, MLH1, MSH2, MSH3,  MSH6, MUTYH, NBN, NF1, NF2, NTHL1, PALB2, PHOX2B, PMS2, POT1, PRKAR1A, PTCH1, PTEN, RAD51C, RAD51D, RB1, RECQL, RET, SDHA, SDHAF2, SDHB, SDHC, SDHD, SMAD4, SMARCA4, SMARCB1, SMARCE1, STK11, SUFU, TMEM127, TP53, TSC1, TSC2, VHL and XRCC2 (sequencing and deletion/duplication); EGFR, EGLN1, HOXB13, KIT, MITF, PDGFRA, POLD1, and POLE (sequencing only); EPCAM and GREM1 (deletion/duplication only).   PLAN: Jashawna is having symptoms consistent with peripheral neuropathy.  She had denied neuropathy previously but either it developed after she completed chemotherapy, which sometimes happens, or there was some confusion due to language difficulties.  At any rate at this point she should be on gabapentin and I wrote her to take 100 mg in the morning and at midday and 300 mg at bedtime.  I made sure that the interpreter gave her that information and I also wrote it down for her.  She will see me again in 3 weeks and we will assess her tolerance of this medication and whether it works at that point  Total encounter time 20 minutes.Rebecca Jews C. Synai Prettyman, MD 08/21/21 11:26 AM Medical Oncology and Hematology [facility] [place], [place] [address] Tel. [phone]    Fax. [phone]   I, Wilburn Mylar, am acting as scribe for Dr. Virgie [name]. Rebecca [name].  I, Lurline Del MD, have reviewed the above documentation for accuracy and completeness, and I agree with the above.   *Total Encounter Time as defined by the Centers for Medicare and Medicaid Services includes, in addition to the face-to-face time of a patient visit (documented in the note above) non-face-to-face time: obtaining and reviewing outside history, ordering and reviewing medications, tests or procedures, care coordination (communications with other health care professionals or caregivers) and documentation in the medical record.

## 2021-08-22 ENCOUNTER — Ambulatory Visit
Admission: RE | Admit: 2021-08-22 | Discharge: 2021-08-22 | Disposition: A | Discharge status: Home / Self Care | Payer: 59 | Source: Ambulatory Visit | Attending: Radiation Oncology | Admitting: Radiation Oncology

## 2021-08-22 ENCOUNTER — Inpatient Hospital Stay: Payer: 59

## 2021-08-22 VITALS — BP 122/77 | HR 87 | Temp 97.9°F | Resp 18 | Ht 66.0 in | Wt 145.5 lb

## 2021-08-22 DIAGNOSIS — Z79899 Other long term (current) drug therapy: Secondary | ICD-10-CM | POA: Insufficient documentation

## 2021-08-22 DIAGNOSIS — Z5112 Encounter for antineoplastic immunotherapy: Secondary | ICD-10-CM | POA: Insufficient documentation

## 2021-08-22 DIAGNOSIS — C50311 Malignant neoplasm of lower-inner quadrant of right female breast: Secondary | ICD-10-CM | POA: Insufficient documentation

## 2021-08-22 DIAGNOSIS — Z170 Estrogen receptor positive status [ER+]: Secondary | ICD-10-CM | POA: Insufficient documentation

## 2021-08-22 DIAGNOSIS — Z510 Encounter for antineoplastic radiation therapy: Secondary | ICD-10-CM | POA: Diagnosis not present

## 2021-08-22 DIAGNOSIS — E1140 Type 2 diabetes mellitus with diabetic neuropathy, unspecified: Secondary | ICD-10-CM | POA: Diagnosis not present

## 2021-08-22 DIAGNOSIS — Z95828 Presence of other vascular implants and grafts: Secondary | ICD-10-CM

## 2021-08-22 DIAGNOSIS — Z7984 Long term (current) use of oral hypoglycemic drugs: Secondary | ICD-10-CM | POA: Diagnosis not present

## 2021-08-22 DIAGNOSIS — Z171 Estrogen receptor negative status [ER-]: Secondary | ICD-10-CM | POA: Diagnosis not present

## 2021-08-22 LAB — CMP (CANCER CENTER ONLY)
ALT: 31 U/L (ref 0–44)
AST: 33 U/L (ref 15–41)
Albumin: 4 g/dL (ref 3.5–5.0)
Alkaline Phosphatase: 90 U/L (ref 38–126)
Anion gap: 12 (ref 5–15)
BUN: 13 mg/dL (ref 6–20)
CO2: 23 mmol/L (ref 22–32)
Calcium: 9.1 mg/dL (ref 8.9–10.3)
Chloride: 103 mmol/L (ref 98–111)
Creatinine: 0.69 mg/dL (ref 0.44–1.00)
GFR, Estimated: >60 mL/min (ref >60)
Glucose, Bld: 209 mg/dL — ABNORMAL HIGH (ref 70–99)
Potassium: 4 mmol/L (ref 3.5–5.1)
Sodium: 138 mmol/L (ref 135–145)
Total Bilirubin: 0.5 mg/dL (ref 0.3–1.2)
Total Protein: 7.2 g/dL (ref 6.5–8.1)

## 2021-08-22 LAB — TSH: TSH: 1.404 u[IU]/mL (ref 0.308–3.960)

## 2021-08-22 LAB — CBC WITH DIFFERENTIAL (CANCER CENTER ONLY)
Abs Immature Granulocytes: 0.01 10*3/uL (ref 0.00–0.07)
Basophils Absolute: 0 10*3/uL (ref 0.0–0.1)
Basophils Relative: 0 %
Eosinophils Absolute: 0.1 10*3/uL (ref 0.0–0.5)
Eosinophils Relative: 4 %
HCT: 39.6 % (ref 36.0–46.0)
Hemoglobin: 12.5 g/dL (ref 12.0–15.0)
Immature Granulocytes: 0 %
Lymphocytes Relative: 29 %
Lymphs Abs: 0.9 10*3/uL (ref 0.7–4.0)
MCH: 26 pg (ref 26.0–34.0)
MCHC: 31.6 g/dL (ref 30.0–36.0)
MCV: 82.3 fL (ref 80.0–100.0)
Monocytes Absolute: 0.4 10*3/uL (ref 0.1–1.0)
Monocytes Relative: 12 %
Neutro Abs: 1.6 10*3/uL — ABNORMAL LOW (ref 1.7–7.7)
Neutrophils Relative %: 55 %
Platelet Count: 235 10*3/uL (ref 150–400)
RBC: 4.81 MIL/uL (ref 3.87–5.11)
RDW: 12.9 % (ref 11.5–15.5)
WBC Count: 2.9 10*3/uL — ABNORMAL LOW (ref 4.0–10.5)
nRBC: 0 % (ref 0.0–0.2)

## 2021-08-22 MED ORDER — SODIUM CHLORIDE 0.9 % IV SOLN
200.0000 mg | Freq: Once | INTRAVENOUS | Status: AC
  Administered 2021-08-22: 200 mg via INTRAVENOUS
  Filled 2021-08-22: qty 8

## 2021-08-22 MED ORDER — SODIUM CHLORIDE 0.9 % IV SOLN: Freq: Once | INTRAVENOUS | Status: AC

## 2021-08-22 MED ORDER — SODIUM CHLORIDE 0.9% FLUSH
10.0000 mL | INTRAVENOUS | Status: DC | PRN
  Administered 2021-08-22: 10 mL

## 2021-08-22 MED ORDER — SODIUM CHLORIDE 0.9% FLUSH
10.0000 mL | Freq: Once | INTRAVENOUS | Status: AC
  Administered 2021-08-22: 10 mL

## 2021-08-22 NOTE — Patient Instructions (Signed)
[facility]ICAL [facility]  ° Discharge Instructions: °Thank you for choosing [facility] to provide your oncology and hematology care.  ° °If you have a lab appointment with the Cancer Center, please go directly to the Cancer Center and check in at the registration area. °  °Wear comfortable clothing and clothing appropriate for easy access to any Portacath or PICC line.  ° °We strive to give you quality time with your provider. You may need to reschedule your appointment if you arrive late (15 or more minutes).  Arriving late affects you and other patients whose appointments are after yours.  Also, if you miss three or more appointments without notifying the office, you may be dismissed from the clinic at the provider’s discretion.    °  °For prescription refill requests, have your pharmacy contact our office and allow 72 hours for refills to be completed.   ° °Today you received the following chemotherapy and/or immunotherapy agents: Pembrolizumab (Keytruda) °  °To help prevent nausea and vomiting after your treatment, we encourage you to take your nausea medication as directed. ° °BELOW ARE SYMPTOMS THAT SHOULD BE REPORTED IMMEDIATELY: °*FEVER GREATER THAN 100.4 F (38 °C) OR HIGHER °*CHILLS OR SWEATING °*NAUSEA AND VOMITING THAT IS NOT CONTROLLED WITH YOUR NAUSEA MEDICATION °*UNUSUAL SHORTNESS OF BREATH °*UNUSUAL BRUISING OR BLEEDING °*URINARY PROBLEMS (pain or burning when urinating, or frequent urination) °*BOWEL PROBLEMS (unusual diarrhea, constipation, pain near the anus) °TENDERNESS IN MOUTH AND THROAT WITH OR WITHOUT PRESENCE OF ULCERS (sore throat, sores in mouth, or a toothache) °UNUSUAL RASH, SWELLING OR PAIN  °UNUSUAL VAGINAL DISCHARGE OR ITCHING  ° °Items with * indicate a potential emergency and should be followed up as soon as possible or go to the Emergency Department if any problems should occur. ° °Please show the CHEMOTHERAPY ALERT CARD or IMMUNOTHERAPY ALERT CARD  at check-in to the Emergency Department and triage nurse. ° °Should you have questions after your visit or need to cancel or reschedule your appointment, please contact [name]  Dept: [phone]  and follow the prompts.  Office hours are 8:00 a.m. to 4:30 p.m. Monday - Friday. Please note that voicemails left after 4:00 p.m. may not be returned until the following business day.  We are closed weekends and major holidays. You have access to a nurse at all times for urgent questions. Please call the main number to the clinic Dept: [phone] and follow the prompts. ° ° °For any non-urgent questions, you may also contact your provider using MyChart. We now offer e-Visits for anyone 18 and older to request care online for non-urgent symptoms. For details visit [email or web address]. °  °Also download the MyChart app! Go to the app store, search "MyChart", open the app, select [facility], and log in with your MyChart username and password. ° °Due to Covid, a mask is required upon entering the hospital/clinic. If you do not have a mask, one will be given to you upon arrival. For doctor visits, patients may have 1 support person aged 18 or older with them. For treatment visits, patients cannot have anyone with them due to current Covid guidelines and our immunocompromised population.  ° °

## 2021-08-23 ENCOUNTER — Other Ambulatory Visit: Payer: Self-pay

## 2021-08-23 ENCOUNTER — Ambulatory Visit
Admission: RE | Admit: 2021-08-23 | Discharge: 2021-08-23 | Disposition: A | Discharge status: Home / Self Care | Payer: 59 | Source: Ambulatory Visit | Attending: Radiation Oncology | Admitting: Radiation Oncology

## 2021-08-23 DIAGNOSIS — Z510 Encounter for antineoplastic radiation therapy: Secondary | ICD-10-CM | POA: Diagnosis not present

## 2021-08-24 ENCOUNTER — Other Ambulatory Visit: Payer: Self-pay

## 2021-08-24 ENCOUNTER — Ambulatory Visit
Admission: RE | Admit: 2021-08-24 | Discharge: 2021-08-24 | Disposition: A | Discharge status: Home / Self Care | Payer: 59 | Source: Ambulatory Visit | Attending: Radiation Oncology | Admitting: Radiation Oncology

## 2021-08-24 DIAGNOSIS — Z510 Encounter for antineoplastic radiation therapy: Secondary | ICD-10-CM | POA: Diagnosis not present

## 2021-08-25 ENCOUNTER — Ambulatory Visit
Admission: RE | Admit: 2021-08-25 | Discharge: 2021-08-25 | Disposition: A | Discharge status: Home / Self Care | Payer: 59 | Source: Ambulatory Visit | Attending: Radiation Oncology | Admitting: Radiation Oncology

## 2021-08-25 DIAGNOSIS — Z510 Encounter for antineoplastic radiation therapy: Secondary | ICD-10-CM | POA: Diagnosis not present

## 2021-08-28 ENCOUNTER — Ambulatory Visit
Admission: RE | Admit: 2021-08-28 | Discharge: 2021-08-28 | Disposition: A | Discharge status: Home / Self Care | Payer: 59 | Source: Ambulatory Visit | Attending: Radiation Oncology | Admitting: Radiation Oncology

## 2021-08-28 ENCOUNTER — Other Ambulatory Visit: Payer: Self-pay

## 2021-08-28 DIAGNOSIS — Z510 Encounter for antineoplastic radiation therapy: Secondary | ICD-10-CM | POA: Diagnosis not present

## 2021-08-29 ENCOUNTER — Ambulatory Visit
Admission: RE | Admit: 2021-08-29 | Discharge: 2021-08-29 | Disposition: A | Discharge status: Home / Self Care | Payer: 59 | Source: Ambulatory Visit | Attending: Radiation Oncology | Admitting: Radiation Oncology

## 2021-08-29 DIAGNOSIS — Z510 Encounter for antineoplastic radiation therapy: Secondary | ICD-10-CM | POA: Diagnosis not present

## 2021-08-30 ENCOUNTER — Ambulatory Visit
Admission: RE | Admit: 2021-08-30 | Discharge: 2021-08-30 | Disposition: A | Discharge status: Home / Self Care | Payer: 59 | Source: Ambulatory Visit | Attending: Radiation Oncology | Admitting: Radiation Oncology

## 2021-08-30 ENCOUNTER — Other Ambulatory Visit: Payer: Self-pay

## 2021-08-30 DIAGNOSIS — Z510 Encounter for antineoplastic radiation therapy: Secondary | ICD-10-CM | POA: Diagnosis not present

## 2021-08-31 ENCOUNTER — Ambulatory Visit
Admission: RE | Admit: 2021-08-31 | Discharge: 2021-08-31 | Disposition: A | Discharge status: Home / Self Care | Payer: 59 | Source: Ambulatory Visit | Attending: Radiation Oncology | Admitting: Radiation Oncology

## 2021-08-31 ENCOUNTER — Encounter: Payer: Self-pay | Admitting: General Practice

## 2021-08-31 DIAGNOSIS — Z510 Encounter for antineoplastic radiation therapy: Secondary | ICD-10-CM | POA: Diagnosis not present

## 2021-08-31 NOTE — Progress Notes (Signed)
[facility] CSW Progress Notes  Request from Dr Isidore Moos to call patient - patient reports she has no heat or air conditioning in her apartment.  Oncologist believes this is an issue for patient's well being.  Called patient using [employer] data processor for [employer] interpreter [phone]).  Patient reports that she had not heat/ac for past 4 months; however, landlord fixed yesterday and it is now functional.  No concerns at this time.   She did ask for help following up w [facility] on her disability application.  She applied and has been unable to get an update on her status.  CSW [employer] asking for update and also that they call patient.  Will update patient when possible.  Edwyna Shell, LCSW Clinical Social Worker Phone:  [phone]

## 2021-09-01 ENCOUNTER — Telehealth: Payer: Self-pay

## 2021-09-01 ENCOUNTER — Ambulatory Visit
Admission: RE | Admit: 2021-09-01 | Discharge: 2021-09-01 | Disposition: A | Discharge status: Home / Self Care | Payer: 59 | Source: Ambulatory Visit | Attending: Radiation Oncology | Admitting: Radiation Oncology

## 2021-09-01 ENCOUNTER — Other Ambulatory Visit: Payer: Self-pay

## 2021-09-01 ENCOUNTER — Inpatient Hospital Stay (HOSPITAL_BASED_OUTPATIENT_CLINIC_OR_DEPARTMENT_OTHER): Payer: 59 | Admitting: Physician Assistant

## 2021-09-01 VITALS — BP 136/86 | HR 93 | Temp 98.1°F | Resp 18 | Wt 148.6 lb

## 2021-09-01 DIAGNOSIS — R0981 Nasal congestion: Secondary | ICD-10-CM

## 2021-09-01 DIAGNOSIS — H9203 Otalgia, bilateral: Secondary | ICD-10-CM

## 2021-09-01 DIAGNOSIS — Z170 Estrogen receptor positive status [ER+]: Secondary | ICD-10-CM

## 2021-09-01 DIAGNOSIS — Z510 Encounter for antineoplastic radiation therapy: Secondary | ICD-10-CM | POA: Diagnosis not present

## 2021-09-01 DIAGNOSIS — C50311 Malignant neoplasm of lower-inner quadrant of right female breast: Secondary | ICD-10-CM | POA: Diagnosis not present

## 2021-09-01 NOTE — Progress Notes (Signed)
Symptom Management Consult note [place]    Patient Care Team: [name], MD as PCP - General (Family Medicine) Mauro Kaufmann, RN as Oncology Nurse Navigator Rockwell Germany, RN as Oncology Nurse Navigator Erroll Luna, MD as Consulting Physician (General Surgery) Magrinat, Virgie Dad, MD as Consulting Physician (Oncology) Eppie Gibson, MD as Attending Physician (Radiation Oncology)    Name of the patient: Rebecca [name]  [id number]  [birth year]   Date of visit: 09/01/2021    Chief complaint/ Reason for visit- ear pain and congestion  Oncology History  Malignant neoplasm of lower-inner quadrant of right breast of female, estrogen receptor positive ([place])  01/12/2021 Initial Diagnosis   Malignant neoplasm of lower-inner quadrant of right breast of female, estrogen receptor positive ([place])   01/18/2021 Cancer Staging   Staging form: Breast, AJCC 8th Edition - Clinical: Stage IIIB (cT2, cN1, cM0, G3, ER-, PR-, HER2-) - Signed by Chauncey Cruel, MD on 01/18/2021 Stage prefix: Initial diagnosis Histologic grading system: 3 grade system    01/31/2021 Genetic Testing   PNegative hereditary cancer genetic testing: no pathogenic variants detected in [facility] CancerNext-Expanded + RNAinsight Panel.  The report date is January 31, 2021.  The CancerNext-Expanded gene panel offered by [facility] and includes sequencing, rearrangement, and RNA analysis for the following 77 genes: AIP, ALK, APC, ATM, AXIN2, BAP1, BARD1, BLM, BMPR1A, BRCA1, BRCA2, BRIP1, CDC73, CDH1, CDK4, CDKN1B, CDKN2A, CHEK2, CTNNA1, DICER1, FANCC, FH, FLCN, GALNT12, KIF1B, LZTR1, MAX, MEN1, MET, MLH1, MSH2, MSH3, MSH6, MUTYH, NBN, NF1, NF2, NTHL1, PALB2, PHOX2B, PMS2, POT1, PRKAR1A, PTCH1, PTEN, RAD51C, RAD51D, RB1, RECQL, RET, SDHA, SDHAF2, SDHB, SDHC, SDHD, SMAD4, SMARCA4, SMARCB1, SMARCE1, STK11, SUFU, TMEM127, TP53, TSC1, TSC2, VHL and XRCC2 (sequencing and deletion/duplication); EGFR, EGLN1,  HOXB13, KIT, MITF, PDGFRA, POLD1, and POLE (sequencing only); EPCAM and GREM1 (deletion/duplication only).    02/07/2021 - 05/25/2021 Chemotherapy      Patient is on Antibody Plan: HEAD/NECK PEMBROLIZUMAB Q21D     06/13/2021 -  Chemotherapy   Patient is on Treatment Plan : HEAD/NECK Pembrolizumab Q21D       Current Therapy: adjuvant pembrolizumab last received 08/22/21, radiation last treatment today  Interval history- Rebecca [name] is a 55 yo female presenting to [facility] today with chief complaint of right ear pain and congestion x 3 hours.  Patient states she had radiation earlier today and left feeling fine.  When she got home she admits pain started in bilateral ears and face.  She describes the pain as a pressure feeling.  She tried to clean out her ears with a Q-tip without any symptom improvement.  She states the pain is constant.  She rates it 3 out of 10 in severity.  No over-the-counter medications for symptoms tried prior to arrival.  She denies any sick contacts or known COVID exposures.  Denies fever, visual changes, headache, neck pain, facial swelling, tinnitus, fall, head injury, chest pain, shortness of breath, abdominal pain, nausea, emesis, urinary symptoms, diarrhea, rash.  ROS  All other systems are reviewed and are negative for acute change except as noted in the HPI.    Allergies  Allergen Reactions   Lisinopril Swelling    Swelling in throat, coughing, difficulty swallowing, could not sleep. Occurred Dec 2020.     Past Medical History:  Diagnosis Date   Arthritis    Breast cancer ([place])    Diabetes mellitus    Family history of lymphoma 01/18/2021   Family history of throat cancer  01/18/2021   Hyperlipidemia    Hypertension      Past Surgical History:  Procedure Laterality Date   BREAST LUMPECTOMY WITH RADIOACTIVE SEED AND SENTINEL LYMPH NODE BIOPSY Right 07/11/2021   Procedure: RIGHT BREAST LUMPECTOMY WITH RADIOACTIVE SEED AND RIGHT SENTINEL LYMPH NODE  BIOPSY;  Surgeon: Erroll Luna, MD;  Location: [place];  Service: General;  Laterality: Right;   CESAREAN SECTION     PORTACATH PLACEMENT N/A 01/26/2021   Procedure: INSERTION PORT-A-CATH;  Surgeon: Erroll Luna, MD;  Location: [place];  Service: General;  Laterality: N/A;  66   RADIOACTIVE SEED GUIDED AXILLARY SENTINEL LYMPH NODE Right 07/11/2021   Procedure: RADIOACTIVE SEED GUIDED RIGHT AXILLARY LYMPH NODE BIOPSY;  Surgeon: Erroll Luna, MD;  Location: [place];  Service: General;  Laterality: Right;    Social History   Socioeconomic History   Marital status: Divorced    Spouse name: Not on file   Number of children: Not on file   Years of education: Not on file   Highest education level: Not on file  Occupational History   Not on file  Tobacco Use   Smoking status: Never    Passive exposure: Yes   Smokeless tobacco: Never  Vaping Use   Vaping Use: Never used  Substance and Sexual Activity   Alcohol use: No   Drug use: No   Sexual activity: Not Currently  Other Topics Concern   Not on file  Social History Narrative   Not on file   Social Determinants of Health   Financial Resource Strain: Not on file  Food Insecurity: Not on file  Transportation Needs: No Transportation Needs   Lack of Transportation (Medical): No   Lack of Transportation (Non-Medical): No  Physical Activity: Not on file  Stress: Not on file  Social Connections: Not on file  Intimate Partner Violence: Not on file    Family History  Problem Relation Age of Onset   Diabetes Mother    Heart disease Mother    Diabetes Father    Throat cancer Father        d. 2s   Diabetes Sister    Diabetes Brother    Lymphoma Cousin        maternal cousin; d. 75s     Current Outpatient Medications:    amLODipine (NORVASC) 10 MG tablet, TAKE 1 TABLET BY MOUTH EVERY DAY, Disp: 90 tablet, Rfl: 0   Ascorbic Acid (VITAMIN C) 1000 MG tablet, Take 1 tablet (1,000 mg total)  by mouth daily., Disp: 30 tablet, Rfl: 0   atorvastatin (LIPITOR) 80 MG tablet, TAKE 1 TABLET BY MOUTH EVERY DAY, Disp: 90 tablet, Rfl: 1   Blood Glucose Monitoring Suppl (ONETOUCH VERIO REFLECT) w/Device KIT, 1 kit by Does not apply route in the morning, at noon, and at bedtime. Use as directed to test blood sugar up to three times daily.E11.9, Disp: 1 kit, Rfl: 0   cetirizine (ZYRTEC) 10 MG tablet, Take 1 tablet (10 mg total) by mouth daily., Disp: 30 tablet, Rfl: 0   dapagliflozin propanediol (FARXIGA) 10 MG TABS tablet, Take 1 tablet (10 mg total) by mouth daily with breakfast., Disp: 90 tablet, Rfl: 1   ergocalciferol (DRISDOL) 1.25 MG (50000 UT) capsule, Take 1 capsule (50,000 Units total) by mouth once a week., Disp: 12 capsule, Rfl: 0   gabapentin (NEURONTIN) 100 MG capsule, Take 1 capsule (100 mg total) by mouth 2 (two) times daily. Take early in the morning and  at midday., Disp: 60 capsule, Rfl: 6   gabapentin (NEURONTIN) 300 MG capsule, Take 1 capsule (300 mg total) by mouth at bedtime., Disp: 90 capsule, Rfl: 4   glipiZIDE (GLUCOTROL) 5 MG tablet, TAKE 1 TABLET BY MOUTH DAILY, Disp: 90 tablet, Rfl: 1   glucose blood (ONETOUCH VERIO) test strip, Use as directed to test blood sugar up to three times daily. E11.9, Disp: 100 each, Rfl: 6   HYDROcodone-acetaminophen (NORCO/VICODIN) 5-325 MG tablet, Take 1 tablet by mouth every 6 (six) hours as needed for moderate pain., Disp: 15 tablet, Rfl: 0   hydrOXYzine (ATARAX/VISTARIL) 25 MG tablet, Take 1 tablet (25 mg total) by mouth 3 (three) times daily as needed., Disp: 60 tablet, Rfl: 1   ibuprofen (ADVIL) 800 MG tablet, Take 1 tablet (800 mg total) by mouth every 8 (eight) hours as needed., Disp: 30 tablet, Rfl: 0   labetalol (NORMODYNE) 100 MG tablet, Take 100 mg by mouth 2 (two) times daily., Disp: , Rfl:    Lancets (ONETOUCH DELICA PLUS [id number]) MISC, 1 Device by Does not apply route in the morning, at noon, and at bedtime. Use as directed to  test blood sugar up to three times daily. E11.9, Disp: 100 each, Rfl: 6   lidocaine-prilocaine (EMLA) cream, APPLY TO AFFECTED AREA AS DIRECTED, Disp: 30 g, Rfl: 3   magic mouthwash (lidocaine, diphenhydrAMINE, alum & mag hydroxide) suspension, Swish and swallow 5 mls by mouth every 4 hours as needed, Disp: 240 mL, Rfl: 0   magic mouthwash w/lidocaine SOLN, Take 5 mLs by mouth every 4 (four) hours as needed for mouth pain., Disp: 240 mL, Rfl: 0   metFORMIN (GLUCOPHAGE) 500 MG tablet, TAKE 1 TABLET BY MOUTH TWICE DAILY, Disp: 180 tablet, Rfl: 1   naproxen (NAPROSYN) 500 MG tablet, Take 1 tablet (500 mg total) by mouth 2 (two) times daily with a meal., Disp: 30 tablet, Rfl: 1   Omega-3 1000 MG CAPS, Take 1 capsule (1,000 mg total) by mouth daily., Disp: 30 capsule, Rfl: 0   omeprazole (PRILOSEC) 20 MG capsule, Take 1 capsule (20 mg total) by mouth daily., Disp: 30 capsule, Rfl: 0   polyethylene glycol (MIRALAX / GLYCOLAX) 17 g packet, TAKE 17 G BY MOUTH DAILY., Disp: 14 each, Rfl: 0   valACYclovir (VALTREX) 500 MG tablet, Take 1 tablet (500 mg total) by mouth daily., Disp: 30 tablet, Rfl: 0   vitamin B-12 (CYANOCOBALAMIN) 100 MCG tablet, Take 1 tablet (100 mcg total) by mouth daily., Disp: 30 tablet, Rfl: 0  PHYSICAL EXAM: ECOG FS:1 - Symptomatic but completely ambulatory    Vitals:   09/01/21 1553  BP: 136/86  Pulse: 93  Resp: 18  Temp: 98.1 F (36.7 C)  TempSrc: Oral  SpO2: 99%  Weight: 148 lb 9.6 oz (67.4 kg)   Physical Exam Vitals and nursing note reviewed.  Constitutional:      Appearance: She is well-developed. She is not ill-appearing or toxic-appearing.  HENT:     Head: Normocephalic and atraumatic. No right periorbital erythema or left periorbital erythema.     Right Ear: Hearing and external ear normal. No foreign body. No mastoid tenderness.     Left Ear: Hearing and external ear normal. No foreign body. No mastoid tenderness.     Ears:     Comments: Cerumen seen in  bilateral canals able to visualize 60% of TM. No signs of effusion or erythema    Nose: Congestion present.     Right Nostril: No epistaxis  or occlusion.     Left Nostril: No epistaxis or occlusion.     Right Sinus: Frontal sinus tenderness present.     Left Sinus: Frontal sinus tenderness present.     Mouth/Throat:     Mouth: No oral lesions.     Pharynx: Uvula midline. No pharyngeal swelling, oropharyngeal exudate, posterior oropharyngeal erythema or uvula swelling.     Tonsils: No tonsillar exudate or tonsillar abscesses.  Eyes:     General: No scleral icterus.       Right eye: No discharge.        Left eye: No discharge.     Conjunctiva/sclera: Conjunctivae normal.  Neck:     Vascular: No JVD.  Cardiovascular:     Rate and Rhythm: Normal rate and regular rhythm.     Pulses: Normal pulses.     Heart sounds: Normal heart sounds.  Pulmonary:     Effort: Pulmonary effort is normal. No respiratory distress.     Breath sounds: Normal breath sounds. No stridor. No wheezing, rhonchi or rales.  Chest:     Chest wall: No tenderness.  Abdominal:     General: There is no distension.  Musculoskeletal:        General: Normal range of motion.     Cervical back: Normal range of motion.     Right lower leg: No edema.     Left lower leg: No edema.  Lymphadenopathy:     Cervical: No cervical adenopathy.  Skin:    General: Skin is warm and dry.     Capillary Refill: Capillary refill takes less than 2 seconds.     Findings: No rash.  Neurological:     Mental Status: She is oriented to person, place, and time.     GCS: GCS eye subscore is 4. GCS verbal subscore is 5. GCS motor subscore is 6.     Comments: Fluent speech, no facial droop.  Psychiatric:        Behavior: Behavior normal.       LABORATORY DATA: I have reviewed the data as listed CBC Latest Ref Rng & Units 08/22/2021 08/01/2021 07/03/2021  WBC 4.0 - 10.5 K/uL 2.9(L) 4.2 4.0  Hemoglobin 12.0 - 15.0 g/dL 12.5 12.5 12.5   Hematocrit 36.0 - 46.0 % 39.6 39.1 39.0  Platelets 150 - 400 K/uL 235 249 258     CMP Latest Ref Rng & Units 08/22/2021 08/01/2021 07/03/2021  Glucose 70 - 99 mg/dL 209(H) 281(H) 168(H)  BUN 6 - 20 mg/dL 13 13 10   Creatinine 0.44 - 1.00 mg/dL 0.69 0.73 0.45  Sodium 135 - 145 mmol/L 138 138 138  Potassium 3.5 - 5.1 mmol/L 4.0 3.9 3.8  Chloride 98 - 111 mmol/L 103 104 100  CO2 22 - 32 mmol/L 23 22 25   Calcium 8.9 - 10.3 mg/dL 9.1 9.5 9.0  Total Protein 6.5 - 8.1 g/dL 7.2 7.5 6.8  Total Bilirubin 0.3 - 1.2 mg/dL 0.5 0.3 0.9  Alkaline Phos 38 - 126 U/L 90 95 73  AST 15 - 41 U/L 33 29 31  ALT 0 - 44 U/L 31 31 31        RADIOGRAPHIC STUDIES: I have personally reviewed the radiological images as listed and agreed with the findings in the report. No images are attached to the encounter. No results found.   ASSESSMENT & PLAN: Patient is a 55 y.o. female with history of triple negative breast cancer currently on adjuvant pembrolizumab with concurrent radiation followed by oncologist  Dr. Jana Hakim.  #) Congestion and ear pain- Patient well-appearing, afebrile, hemodynamically stable.  Symptoms suggestive of developing URI versus sinus infection.  Very early in course of illness as symptoms have only been present x3 hours.  Exam without signs of acute otitis media, mastoiditis or otitis externa.  Unlikely to be radiation side effect based on symptoms she is having.  Patient's most recent blood work was x 10 days ago and had leukopenia 2.6 and ANC 1.6. Offered labs today vs symptomatic OTC management. Patient would like to try OTC medications first. Discussed neutropenic precautions. Discussed symptomatic care including Debrox drops, Flonase, Zyrtec and Tylenol for pain. Discussed follow-up precautions as well as ED precautions.  #) Triple negative breast cancer-continue treatment per oncology team   Visit Diagnosis: 1. Nasal sinus congestion   2. Ear pain, bilateral   3. Malignant neoplasm  of lower-inner quadrant of right breast of female, estrogen receptor positive ([place])      No orders of the defined types were placed in this encounter.   All questions were answered. The patient knows to call the clinic with any problems, questions or concerns. No barriers to learning was detected.  I have spent a total of 15 minutes minutes of face-to-face and non-face-to-face time, preparing to see the patient, obtaining and/or reviewing separately obtained history, performing a medically appropriate examination, counseling and educating the patient, documenting clinical information in the electronic health record.    Thank you for allowing me to participate in the care of this patient.    Barrie Folk, PA-C Department of Hematology/Oncology [facility] at [facility] Phone: [phone]  [phone]    09/01/2021 4:12 PM

## 2021-09-01 NOTE — Patient Instructions (Signed)
You can try over the counter medications to help your symptoms. It is possible you are developing a sinus infection.  -Debrox- ear drops to help remove wax buildup  -Flonase to be used 1 spray in each nostril daily.  This medication is used to treat your congestion.  -zyrtec an allergy medicine.   -tylenol to be taken once every 6 hours as needed for pain. Please take this medicine with food as it can cause stomach upset and at worst stomach bleeding.    More Symptomatic Treatments Options: Treatment is directed at relieving symptoms.  Treatment may include:  Increased fluid intake. Sports drinks offer valuable electrolytes, sugars, and fluids.  Breathing heated mist or steam (vaporizer or shower).  Eating chicken soup or other clear broths, and maintaining good nutrition.  Getting plenty of rest.  Using gargles or lozenges for comfort.  Controlling fevers with ibuprofen or acetaminophen as directed by your caregiver.  Increasing usage of your inhaler if you have asthma.  Return to work when your temperature has returned to normal.    You will need to follow-up with your primary care provider in 1 week if your symptoms have not improved.  If you do not have a primary care provider one is provided in your discharge instructions.  Go to the emergency department for any new or worsening symptoms including but not limited to persistent fever for 5 days, difficulty breathing, chest pain, rashes, passing out, or any other concerns.

## 2021-09-01 NOTE — Telephone Encounter (Signed)
Pt called and c/o (R) ear pain, stating it started around 10 AM. Pt denies fever/cough/sore throat. Pt was offered and accepted an appt to be seen [facility] today.

## 2021-09-04 ENCOUNTER — Ambulatory Visit
Admission: RE | Admit: 2021-09-04 | Discharge: 2021-09-04 | Disposition: A | Discharge status: Home / Self Care | Payer: 59 | Source: Ambulatory Visit | Attending: Radiation Oncology | Admitting: Radiation Oncology

## 2021-09-04 ENCOUNTER — Other Ambulatory Visit: Payer: Self-pay

## 2021-09-04 ENCOUNTER — Other Ambulatory Visit: Payer: Self-pay | Admitting: Radiation Oncology

## 2021-09-04 DIAGNOSIS — C50311 Malignant neoplasm of lower-inner quadrant of right female breast: Secondary | ICD-10-CM

## 2021-09-04 DIAGNOSIS — Z510 Encounter for antineoplastic radiation therapy: Secondary | ICD-10-CM | POA: Diagnosis not present

## 2021-09-04 MED ORDER — RADIAPLEXRX EX GEL: Freq: Once | CUTANEOUS | Status: AC

## 2021-09-04 MED ORDER — FLUCONAZOLE 100 MG PO TABS: ORAL_TABLET | ORAL | 0 refills | Status: DC

## 2021-09-05 ENCOUNTER — Ambulatory Visit
Admission: RE | Admit: 2021-09-05 | Discharge: 2021-09-05 | Disposition: A | Discharge status: Home / Self Care | Payer: 59 | Source: Ambulatory Visit | Attending: Radiation Oncology | Admitting: Radiation Oncology

## 2021-09-05 DIAGNOSIS — Z510 Encounter for antineoplastic radiation therapy: Secondary | ICD-10-CM | POA: Diagnosis not present

## 2021-09-06 ENCOUNTER — Other Ambulatory Visit: Payer: Self-pay

## 2021-09-06 ENCOUNTER — Ambulatory Visit
Admission: RE | Admit: 2021-09-06 | Discharge: 2021-09-06 | Disposition: A | Discharge status: Home / Self Care | Payer: 59 | Source: Ambulatory Visit | Attending: Radiation Oncology | Admitting: Radiation Oncology

## 2021-09-06 DIAGNOSIS — Z510 Encounter for antineoplastic radiation therapy: Secondary | ICD-10-CM | POA: Diagnosis not present

## 2021-09-07 ENCOUNTER — Ambulatory Visit
Admission: RE | Admit: 2021-09-07 | Discharge: 2021-09-07 | Disposition: A | Discharge status: Home / Self Care | Payer: 59 | Source: Ambulatory Visit | Attending: Radiation Oncology | Admitting: Radiation Oncology

## 2021-09-07 DIAGNOSIS — Z510 Encounter for antineoplastic radiation therapy: Secondary | ICD-10-CM | POA: Diagnosis not present

## 2021-09-08 ENCOUNTER — Other Ambulatory Visit: Payer: Self-pay

## 2021-09-08 ENCOUNTER — Ambulatory Visit
Admission: RE | Admit: 2021-09-08 | Discharge: 2021-09-08 | Disposition: A | Discharge status: Home / Self Care | Payer: 59 | Source: Ambulatory Visit | Attending: Radiation Oncology | Admitting: Radiation Oncology

## 2021-09-08 DIAGNOSIS — Z510 Encounter for antineoplastic radiation therapy: Secondary | ICD-10-CM | POA: Diagnosis not present

## 2021-09-10 ENCOUNTER — Ambulatory Visit
Admission: RE | Admit: 2021-09-10 | Discharge: 2021-09-10 | Disposition: A | Discharge status: Home / Self Care | Payer: 59 | Source: Ambulatory Visit | Attending: Radiation Oncology | Admitting: Radiation Oncology

## 2021-09-10 DIAGNOSIS — Z510 Encounter for antineoplastic radiation therapy: Secondary | ICD-10-CM | POA: Diagnosis not present

## 2021-09-11 ENCOUNTER — Ambulatory Visit
Admission: RE | Admit: 2021-09-11 | Discharge: 2021-09-11 | Disposition: A | Discharge status: Home / Self Care | Payer: 59 | Source: Ambulatory Visit | Attending: Radiation Oncology | Admitting: Radiation Oncology

## 2021-09-11 ENCOUNTER — Encounter: Payer: Self-pay | Admitting: Internal Medicine

## 2021-09-11 ENCOUNTER — Telehealth: Payer: Self-pay

## 2021-09-11 ENCOUNTER — Other Ambulatory Visit: Payer: Self-pay

## 2021-09-11 ENCOUNTER — Ambulatory Visit: Payer: 59 | Attending: Internal Medicine | Admitting: Internal Medicine

## 2021-09-11 VITALS — BP 121/83 | HR 91 | Resp 16 | Wt 149.8 lb

## 2021-09-11 DIAGNOSIS — H65191 Other acute nonsuppurative otitis media, right ear: Secondary | ICD-10-CM

## 2021-09-11 DIAGNOSIS — R0981 Nasal congestion: Secondary | ICD-10-CM | POA: Diagnosis not present

## 2021-09-11 DIAGNOSIS — Z510 Encounter for antineoplastic radiation therapy: Secondary | ICD-10-CM | POA: Diagnosis not present

## 2021-09-11 DIAGNOSIS — Z170 Estrogen receptor positive status [ER+]: Secondary | ICD-10-CM

## 2021-09-11 MED ORDER — AMOXICILLIN-POT CLAVULANATE 500-125 MG PO TABS: 1.0000 | ORAL_TABLET | Freq: Two times a day (BID) | ORAL | 0 refills | Status: DC

## 2021-09-11 MED ORDER — RADIAPLEXRX EX GEL: Freq: Once | CUTANEOUS | Status: AC

## 2021-09-11 NOTE — Progress Notes (Signed)
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  She is accompanied by an interpreter  She continues on pembrolizumab. We held her last dose for her surgery. She is due for a dose today.  She understands she will continue this treatment through April 2023  She is currently receiving radiation therapy under Dr. Isidore Moos. She began on 08/16/2021 and is scheduled to finish on 09/27/2021.  She says her skin feels hot but otherwise she is tolerating treatment well  We are following her thyroid studies: Lab Results  Component Value Date   TSH 1.404 08/22/2021   TSH 1.176 08/08/2021   TSH 0.930 08/01/2021   TSH 1.204 06/13/2021   TSH 1.053 05/23/2021    REVIEW OF SYSTEMS: Kacelyn tells me her right armpit feels "different".  She wanted me to recheck that.  She tells me she has an infection in her right ear.  She says she saw her primary care physician and was started on an antibiotic.  She says whenever she started on antibiotics she gets the problem in her mouth with take to be thrush and she wanted me to prescribe the medication we use for that.  She also tells me she has not been approved for disability and she does not think she can go back to work yet because she has pain in her  legs, neuropathy in her hands, and she feels weak.  A detailed review of systems today was otherwise noncontributory  COVID 19 VACCINATION STATUS: fully vaccinated [employer]), with booster 12/2020   HISTORY OF CURRENT ILLNESS: From the original intake note:  Sobia A Middleton herself palpated a mass in the right axilla. She underwent bilateral diagnostic mammography with tomography and right breast ultrasonography at [facility] on 01/03/2021 showing: breast density category B; two adjacent suspicious right breast masses at 5 o'clock; markedly abnormal right axillary lymph node; no evidence of malignancy on left.  Accordingly on 01/06/2021 she proceeded to biopsy of the right breast area in question. The pathology from this procedure ([id number]) showed: invasive ductal carcinoma, grade 3. Prognostic indicators significant for: estrogen receptor, 10% positive with weak staining intensity and progesterone receptor, 0% negative. Proliferation marker Ki67 at 90%. HER2 equivocal by immunohistochemistry (2+), but negative by fluorescent in situ hybridization with a signals ratio 1.38 and number per cell 3.63.   Cancer Staging  Malignant neoplasm of lower-inner quadrant of right breast of female, estrogen receptor positive ([place]) Staging form: Breast, AJCC 8th Edition - Clinical: Stage IIIB (cT2, cN1, cM0, G3, ER-, PR-, HER2-) - Signed by Chauncey Cruel, MD on 01/18/2021 Stage prefix: Initial diagnosis Histologic grading system: 3 grade system  [name] is as detailed below.   PAST MEDICAL HISTORY: Past Medical History:  Diagnosis Date   Arthritis    Breast cancer ([place])    Diabetes mellitus    Family history of lymphoma  01/18/2021   Family history of throat cancer 01/18/2021   Hyperlipidemia    Hypertension     PAST SURGICAL HISTORY: Past Surgical History:  Procedure Laterality Date   BREAST LUMPECTOMY WITH RADIOACTIVE SEED AND SENTINEL LYMPH NODE BIOPSY  Right 07/11/2021   Procedure: RIGHT BREAST LUMPECTOMY WITH RADIOACTIVE SEED AND RIGHT SENTINEL LYMPH NODE BIOPSY;  Surgeon: Erroll Luna, MD;  Location: [facility];  Service: General;  Laterality: Right;   CESAREAN SECTION     PORTACATH PLACEMENT N/A 01/26/2021   Procedure: INSERTION PORT-A-CATH;  Surgeon: Erroll Luna, MD;  Location: [facility];  Service: General;  Laterality: N/A;  [place] Right 07/11/2021   Procedure: RADIOACTIVE SEED GUIDED RIGHT AXILLARY LYMPH NODE BIOPSY;  Surgeon: Erroll Luna, MD;  Location: [facility];  Service: General;  Laterality: Right;    FAMILY HISTORY: Family History  Problem Relation Age of Onset   Diabetes Mother    Heart disease Mother    Diabetes Father    Throat cancer Father        d. 38s   Diabetes Sister    Diabetes Brother    Lymphoma Cousin        maternal cousin; d. 51s   Her father died in his 36's and her mother in her 18'Z, both from complications of diabetes. Joanette has 6 brothers and 1 sister. There is no family history of breast or ovarian cancer to her knowledge.   GYNECOLOGIC HISTORY:  No LMP recorded. Patient is perimenopausal. Menarche: 55 years old [place] P 0 LMP 10/31/2020 (before that, 06/2020) Contraceptive used from 2017-2018 HRT n/a  Hysterectomy? no BSO? no   SOCIAL HISTORY: (updated 12/2020)  Marijose is originally from [place]. 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 She is divorced. She lives at home with her nephew [name], who is 26 and works for [employer]. She attends [employer]).    ADVANCED DIRECTIVES: not in place; at the 01/18/2021 visit she stated her ex-husband [name], who is disabled due to past injuries, [phone] would be the person to contact if she became incapacitated [the patient states aside from her nephew she has no other  relatives in [place]: Social History   Tobacco Use   Smoking status: Never    Passive exposure: Yes   Smokeless tobacco: Never  Vaping Use   Vaping Use: Never used  Substance Use Topics   Alcohol use: No   Drug use: No     Colonoscopy: never done, referral in place  PAP: 12/2020, negative  Bone density: never done   Allergies  Allergen Reactions   Lisinopril Swelling    Swelling in throat, coughing, difficulty swallowing, could not sleep. Occurred Dec 2020.    Current Outpatient Medications  Medication Sig Dispense Refill   amLODipine (NORVASC) 10 MG tablet TAKE 1 TABLET BY MOUTH EVERY DAY 90 tablet 0   amoxicillin-clavulanate (AUGMENTIN) 500-125 MG tablet Take 1 tablet (500 mg total) by mouth 2 (two) times daily. 12 tablet 0   Ascorbic Acid (VITAMIN C) 1000 MG tablet Take 1 tablet (1,000 mg total) by mouth daily. 30 tablet 0   atorvastatin (LIPITOR) 80 MG tablet TAKE 1 TABLET BY MOUTH EVERY DAY 90 tablet 1   Blood Glucose Monitoring Suppl (ONETOUCH VERIO REFLECT) w/Device KIT 1 kit by Does not apply route in the morning, at noon,  and at bedtime. Use as directed to test blood sugar up to three times daily.E11.9 1 kit 0   cetirizine (ZYRTEC) 10 MG tablet Take 1 tablet (10 mg total) by mouth daily. 30 tablet 0   dapagliflozin propanediol (FARXIGA) 10 MG TABS tablet Take 1 tablet (10 mg total) by mouth daily with breakfast. 90 tablet 1   ergocalciferol (DRISDOL) 1.25 MG (50000 UT) capsule Take 1 capsule (50,000 Units total) by mouth once a week. 12 capsule 0   gabapentin (NEURONTIN) 100 MG capsule Take 1 capsule (100 mg total) by mouth 2 (two) times daily. Take early in the morning and at midday. 60 capsule 6   gabapentin (NEURONTIN) 300 MG capsule Take 1 capsule (300 mg total) by mouth at bedtime. 90 capsule 4   glipiZIDE (GLUCOTROL) 5 MG tablet TAKE 1 TABLET BY MOUTH DAILY 90 tablet 1   glucose blood (ONETOUCH VERIO) test strip Use as directed to test  blood sugar up to three times daily. E11.9 100 each 6   labetalol (NORMODYNE) 100 MG tablet Take 100 mg by mouth 2 (two) times daily.     Lancets (ONETOUCH DELICA PLUS [id number]) MISC 1 Device by Does not apply route in the morning, at noon, and at bedtime. Use as directed to test blood sugar up to three times daily. E11.9 100 each 6   lidocaine-prilocaine (EMLA) cream APPLY TO AFFECTED AREA AS DIRECTED 30 g 3   metFORMIN (GLUCOPHAGE) 500 MG tablet TAKE 1 TABLET BY MOUTH TWICE DAILY 180 tablet 1   Omega-3 1000 MG CAPS Take 1 capsule (1,000 mg total) by mouth daily. 30 capsule 0   omeprazole (PRILOSEC) 20 MG capsule Take 1 capsule (20 mg total) by mouth daily. 30 capsule 0   vitamin B-12 (CYANOCOBALAMIN) 100 MCG tablet Take 1 tablet (100 mcg total) by mouth daily. 30 tablet 0   No current facility-administered medications for this visit.    OBJECTIVE: [place] woman who appears stated age  55:   09/12/21 0805  BP: 134/77  Pulse: (!) 108  Resp: 18  Temp: 97.9 F (36.6 C)  SpO2: 99%      Body mass index is 23.6 kg/m.   Wt Readings from Last 3 Encounters:  09/12/21 146 lb 3.2 oz (66.3 kg)  09/11/21 149 lb 12.8 oz (67.9 kg)  09/01/21 148 lb 9.6 oz (67.4 kg)      ECOG FS:1 - Symptomatic but completely ambulatory  Her hair is coming in full.  It is hennaed and currently Sclerae unicteric, EOMs intact Wearing a mask No cervical or supraclavicular adenopathy Lungs no rales or rhonchi Heart regular rate and rhythm Abd soft, nontender, positive bowel sounds MSK no focal spinal tenderness, no upper extremity lymphedema Neuro: nonfocal, well oriented, appropriate affect Breasts: The right axilla is status post surgery.  There is no palpable mass or seroma, there is no dehiscence or erythema.  I think what she is feeling is simple postoperative change.  The right breast is slightly duskier than the left due to radiation but there is no desquamation.  The left breast and left axilla  are benign   LAB RESULTS:  CMP     Component Value Date/Time   NA 138 08/22/2021 0755   NA 140 12/27/2020 1123   K 4.0 08/22/2021 0755   CL 103 08/22/2021 0755   CO2 23 08/22/2021 0755   GLUCOSE 209 (H) 08/22/2021 0755   BUN 13 08/22/2021 0755   BUN 11 12/27/2020 1123   CREATININE  0.69 08/22/2021 0755   CREATININE 0.50 11/02/2016 1102   CALCIUM 9.1 08/22/2021 0755   PROT 7.2 08/22/2021 0755   PROT 7.5 12/27/2020 1123   ALBUMIN 4.0 08/22/2021 0755   ALBUMIN 4.7 12/27/2020 1123   AST 33 08/22/2021 0755   ALT 31 08/22/2021 0755   ALKPHOS 90 08/22/2021 0755   BILITOT 0.5 08/22/2021 0755   GFRNONAA >60 08/22/2021 0755   GFRNONAA >89 01/16/2016 1006   GFRAA 118 06/02/2020 0942   GFRAA >89 01/16/2016 1006    No results found for: TOTALPROTELP, ALBUMINELP, A1GS, A2GS, BETS, BETA2SER, GAMS, MSPIKE, SPEI  Lab Results  Component Value Date   WBC 2.9 (L) 08/22/2021   NEUTROABS 1.6 (L) 08/22/2021   HGB 12.5 08/22/2021   HCT 39.6 08/22/2021   MCV 82.3 08/22/2021   PLT 235 08/22/2021    No results found for: LABCA2  No components found for: [id number]  No results for input(s): INR in the last 168 hours.  No results found for: LABCA2  No results found for: BLT903  No results found for: ESP233  No results found for: AQT622  No results found for: CA2729  No components found for: HGQUANT  No results found for: CEA1 / No results found for: CEA1   No results found for: AFPTUMOR  No results found for: CHROMOGRNA  No results found for: KPAFRELGTCHN, LAMBDASER, KAPLAMBRATIO (kappa/lambda light chains)  No results found for: HGBA, HGBA2QUANT, HGBFQUANT, HGBSQUAN (Hemoglobinopathy evaluation)   No results found for: LDH  No results found for: IRON, TIBC, IRONPCTSAT (Iron and TIBC)  No results found for: FERRITIN  Urinalysis    Component Value Date/Time   COLORURINE YELLOW 06/24/2007 0116   APPEARANCEUR CLOUDY (A) 06/24/2007 0116   LABSPEC 1.025 09/18/2020  1125   PHURINE 5.5 09/18/2020 1125   GLUCOSEU 100 (A) 09/18/2020 1125   HGBUR TRACE (A) 09/18/2020 1125   BILIRUBINUR NEGATIVE 09/18/2020 1125   KETONESUR NEGATIVE 09/18/2020 1125   PROTEINUR NEGATIVE 09/18/2020 1125   UROBILINOGEN 0.2 09/18/2020 1125   NITRITE NEGATIVE 09/18/2020 1125   LEUKOCYTESUR NEGATIVE 09/18/2020 1125    STUDIES: No results found.   ELIGIBLE FOR AVAILABLE RESEARCH PROTOCOL: no  ASSESSMENT: 55 y.o. 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, grade 3, with and MIB-1 of 90%  (a) chest CT scan 01/29/2021 shows no evidence of metastatic disease  (b) bone scan 02/01/2021 shows no evidence of metastatic disease  (1) neoadjuvant chemotherapy consisting of pembrolizumab given day 1 together with carboplatin and paclitaxel, with Taxol given on day 8 and 15, and filgrastim on days 16,17 and 18, started 02/07/2021, repeated every 21 days x 2, followed by doxorubicin and cyclophosphamide every 21 days x 4 also given with pembrolizumab on day 1 and PEG filgrastim day 3, started 03/22/2021 and completed 05/23/2021  (a) carboplatin/paclitaxel discontinued after the cycle 2-day 8 dose with neuropathy developing (subsequently resolved)   (2) pembrolizumab continuing to total 1 year  (3) right lumpectomy and sentinel lymph node sampling 07/11/2021 showed a complete pathologic response (ypT0 ypN0)  (a) a total of 3 right axillary lymph nodes were removed  (4) adjuvant radiation to be completed 09/27/2021  (5) genetics testing 01/31/2021 through the [place] [facility] Panel found no deleterious mutations in AIP, ALK, APC, ATM, AXIN2, BAP1, BARD1, BLM, BMPR1A, BRCA1, BRCA2, BRIP1, CDC73, CDH1, CDK4, CDKN1B, CDKN2A, CHEK2, CTNNA1, DICER1, FANCC, FH, FLCN, GALNT12, KIF1B, LZTR1,  MAX, MEN1, MET, MLH1, MSH2, MSH3, MSH6, MUTYH, NBN, NF1, NF2,  NTHL1, PALB2, PHOX2B, PMS2, POT1, PRKAR1A, PTCH1, PTEN, RAD51C, RAD51D, RB1, RECQL, RET, SDHA, SDHAF2, SDHB, SDHC, SDHD, SMAD4, SMARCA4, SMARCB1, SMARCE1, STK11, SUFU, TMEM127, TP53, TSC1, TSC2, VHL and XRCC2 (sequencing and deletion/duplication); EGFR, EGLN1, HOXB13, KIT, MITF, PDGFRA, POLD1, and POLE (sequencing only); EPCAM and GREM1 (deletion/duplication only).   PLAN: Malaia is tolerating radiation well and is continuing with that.  Her last radiation day will be 09/27/2021.  So far she has had no significant problems from the pembrolizumab and she will continue with a dose today.  She will be treated every 21 days through April 2023.  I reassured her that the discomfort she is feeling in the right axilla is expected and normal.  She is concerned she is going to develop thrush because she is on an antibiotic for a right ear infection and I have written for her to receive Diflucan daily for the next 10 days.  She understands I am retiring.  She will see my physicians assistant in 3 weeks and then she will see her new physician Dr. Leone Payor who in early January.  I have sent a note to social work asking them to troubleshoot any paper problems relating to disability or financial support for this patient.  Total encounter time 20 minutes.Sarajane Jews C. Kurk Corniel, MD 09/12/21 8:21 AM Medical Oncology and Hematology [facility] [place], [place] [address] Tel. [phone]    Fax. [phone]   I, Wilburn Mylar, am acting as scribe for Dr. Virgie Dad. Lathen Seal.  I, Lurline Del MD, have reviewed the above documentation for accuracy and completeness, and I agree with the above.   *Total Encounter Time as defined by the Centers for Medicare and Medicaid Services includes, in addition to the face-to-face time of a patient visit (documented in the note above) non-face-to-face time: obtaining and reviewing outside history, ordering and reviewing medications,  tests or procedures, care coordination (communications with other health care professionals or caregivers) and documentation in the medical record.

## 2021-09-11 NOTE — Progress Notes (Signed)
Patient ID: Rebecca [name], female    DOB: [birth year]  MRN: [id number]  CC: Ear Pain (Right ear)   Subjective: Rebecca [name] is a 55 y.o. female who presents for UC visit Her concerns today include:  history of type 2 diabetes mellitus (A1c 7.1), hyperlipidemia, hypertension, stage IIIB R breast ca ( cT2, CN1, cM0 triple neg ckinically but 10%ER+, completed chemo)  Pt c/o nasal congestion.  Breaths better when she uses OTC allergy nasal spray but she does not recall the name.  No fever or pain over the paranasal sinuses. Also c/o feeling like RT ear is stopped up since last wk. Some pain around the outside of the ear.  She has been using a wax softener over the counter.  Used it one time only Patient Active Problem List   Diagnosis Date Noted   Port-A-Cath in place 02/07/2021   Genetic testing 02/02/2021   Family history of throat cancer 01/18/2021   Family history of lymphoma 01/18/2021   Malignant neoplasm of lower-inner quadrant of right breast of female, estrogen receptor positive ([place]) 01/12/2021   Posterior tibial tendinitis, right leg 01/26/2020   Diabetes ([place]) 03/11/2017   Muscle pain 08/06/2013   Tiredness 08/06/2013   Candida vaginitis 08/19/2012   Dental cavities 08/19/2012   Knee pain, bilateral 04/16/2012   Diabetes mellitus type 2, controlled ([place]) 04/16/2012   Hyperlipidemia 04/16/2012   Palpitations 04/16/2012   Back pain 04/16/2012     Current Outpatient Medications on File Prior to Visit  Medication Sig Dispense Refill   amLODipine (NORVASC) 10 MG tablet TAKE 1 TABLET BY MOUTH EVERY DAY 90 tablet 0   Ascorbic Acid (VITAMIN C) 1000 MG tablet Take 1 tablet (1,000 mg total) by mouth daily. 30 tablet 0   atorvastatin (LIPITOR) 80 MG tablet TAKE 1 TABLET BY MOUTH EVERY DAY 90 tablet 1   Blood Glucose Monitoring Suppl (ONETOUCH VERIO REFLECT) w/Device KIT 1 kit by Does not apply route in the morning, at noon, and at bedtime. Use as directed to test  blood sugar up to three times daily.E11.9 1 kit 0   cetirizine (ZYRTEC) 10 MG tablet Take 1 tablet (10 mg total) by mouth daily. 30 tablet 0   dapagliflozin propanediol (FARXIGA) 10 MG TABS tablet Take 1 tablet (10 mg total) by mouth daily with breakfast. 90 tablet 1   ergocalciferol (DRISDOL) 1.25 MG (50000 UT) capsule Take 1 capsule (50,000 Units total) by mouth once a week. 12 capsule 0   fluconazole (DIFLUCAN) 100 MG tablet Take 2 tablets today, then 1 tablet daily x 6 more days. HOLD ATORVASTATIN WHILE ON THIS MEDICATION. 8 tablet 0   gabapentin (NEURONTIN) 100 MG capsule Take 1 capsule (100 mg total) by mouth 2 (two) times daily. Take early in the morning and at midday. 60 capsule 6   gabapentin (NEURONTIN) 300 MG capsule Take 1 capsule (300 mg total) by mouth at bedtime. 90 capsule 4   glipiZIDE (GLUCOTROL) 5 MG tablet TAKE 1 TABLET BY MOUTH DAILY 90 tablet 1   glucose blood (ONETOUCH VERIO) test strip Use as directed to test blood sugar up to three times daily. E11.9 100 each 6   HYDROcodone-acetaminophen (NORCO/VICODIN) 5-325 MG tablet Take 1 tablet by mouth every 6 (six) hours as needed for moderate pain. 15 tablet 0   hydrOXYzine (ATARAX/VISTARIL) 25 MG tablet Take 1 tablet (25 mg total) by mouth 3 (three) times daily as needed. 60 tablet 1   ibuprofen (ADVIL) 800 MG tablet  Take 1 tablet (800 mg total) by mouth every 8 (eight) hours as needed. 30 tablet 0   labetalol (NORMODYNE) 100 MG tablet Take 100 mg by mouth 2 (two) times daily.     Lancets (ONETOUCH DELICA PLUS [id number]) MISC 1 Device by Does not apply route in the morning, at noon, and at bedtime. Use as directed to test blood sugar up to three times daily. E11.9 100 each 6   lidocaine-prilocaine (EMLA) cream APPLY TO AFFECTED AREA AS DIRECTED 30 g 3   magic mouthwash (lidocaine, diphenhydrAMINE, alum & mag hydroxide) suspension Swish and swallow 5 mls by mouth every 4 hours as needed 240 mL 0   magic mouthwash w/lidocaine SOLN  Take 5 mLs by mouth every 4 (four) hours as needed for mouth pain. 240 mL 0   metFORMIN (GLUCOPHAGE) 500 MG tablet TAKE 1 TABLET BY MOUTH TWICE DAILY 180 tablet 1   naproxen (NAPROSYN) 500 MG tablet Take 1 tablet (500 mg total) by mouth 2 (two) times daily with a meal. 30 tablet 1   Omega-3 1000 MG CAPS Take 1 capsule (1,000 mg total) by mouth daily. 30 capsule 0   omeprazole (PRILOSEC) 20 MG capsule Take 1 capsule (20 mg total) by mouth daily. 30 capsule 0   polyethylene glycol (MIRALAX / GLYCOLAX) 17 g packet TAKE 17 G BY MOUTH DAILY. 14 each 0   valACYclovir (VALTREX) 500 MG tablet Take 1 tablet (500 mg total) by mouth daily. 30 tablet 0   vitamin B-12 (CYANOCOBALAMIN) 100 MCG tablet Take 1 tablet (100 mcg total) by mouth daily. 30 tablet 0   No current facility-administered medications on file prior to visit.    Allergies  Allergen Reactions   Lisinopril Swelling    Swelling in throat, coughing, difficulty swallowing, could not sleep. Occurred Dec 2020.    Social History   Socioeconomic History   Marital status: Divorced    Spouse name: Not on file   Number of children: Not on file   Years of education: Not on file   Highest education level: Not on file  Occupational History   Not on file  Tobacco Use   Smoking status: Never    Passive exposure: Yes   Smokeless tobacco: Never  Vaping Use   Vaping Use: Never used  Substance and Sexual Activity   Alcohol use: No   Drug use: No   Sexual activity: Not Currently  Other Topics Concern   Not on file  Social History Narrative   Not on file   Social Determinants of Health   Financial Resource Strain: Not on file  Food Insecurity: Not on file  Transportation Needs: No Transportation Needs   Lack of Transportation (Medical): No   Lack of Transportation (Non-Medical): No  Physical Activity: Not on file  Stress: Not on file  Social Connections: Not on file  Intimate Partner Violence: Not on file    Family History   Problem Relation Age of Onset   Diabetes Mother    Heart disease Mother    Diabetes Father    Throat cancer Father        d. 79s   Diabetes Sister    Diabetes Brother    Lymphoma Cousin        maternal cousin; d. 40s    Past Surgical History:  Procedure Laterality Date   BREAST LUMPECTOMY WITH RADIOACTIVE SEED AND SENTINEL LYMPH NODE BIOPSY Right 07/11/2021   Procedure: RIGHT BREAST LUMPECTOMY WITH RADIOACTIVE SEED AND RIGHT SENTINEL  LYMPH NODE BIOPSY;  Surgeon: Erroll Luna, MD;  Location: [facility];  Service: General;  Laterality: Right;   CESAREAN SECTION     PORTACATH PLACEMENT N/A 01/26/2021   Procedure: INSERTION PORT-A-CATH;  Surgeon: Erroll Luna, MD;  Location: [facility];  Service: General;  Laterality: N/A;  62   RADIOACTIVE SEED [place] Right 07/11/2021   Procedure: RADIOACTIVE SEED GUIDED RIGHT AXILLARY LYMPH NODE BIOPSY;  Surgeon: Erroll Luna, MD;  Location: [facility];  Service: General;  Laterality: Right;    ROS: Review of Systems Negative except as stated above  PHYSICAL EXAM: BP 121/83   Pulse 91   Resp 16   Wt 149 lb 12.8 oz (67.9 kg)   SpO2 98%   BMI 24.18 kg/m   Physical Exam  General appearance - alert, well appearing, and in no distress Mental status - normal mood, behavior, speech, dress, motor activity, and thought processes Ears -mild erythema and dull light reflex of the right tympanic membrane.  Left ear canal and tympanic membrane within normal limits. Nose -mild enlargement of nasal turbinates on the right. Mouth - mucous membranes moist, pharynx normal without lesions Neck - supple, no significant adenopathy   CMP Latest Ref Rng & Units 08/22/2021 08/01/2021 07/03/2021  Glucose 70 - 99 mg/dL 209(H) 281(H) 168(H)  BUN 6 - 20 mg/dL 13 13 10   Creatinine 0.44 - 1.00 mg/dL 0.69 0.73 0.45  Sodium 135 - 145 mmol/L 138 138 138  Potassium 3.5 - 5.1 mmol/L 4.0 3.9 3.8  Chloride 98 -  111 mmol/L 103 104 100  CO2 22 - 32 mmol/L 23 22 25   Calcium 8.9 - 10.3 mg/dL 9.1 9.5 9.0  Total Protein 6.5 - 8.1 g/dL 7.2 7.5 6.8  Total Bilirubin 0.3 - 1.2 mg/dL 0.5 0.3 0.9  Alkaline Phos 38 - 126 U/L 90 95 73  AST 15 - 41 U/L 33 29 31  ALT 0 - 44 U/L 31 31 31    Lipid Panel     Component Value Date/Time   CHOL 116 11/11/2020 0908   TRIG 178 (H) 11/11/2020 0908   HDL 46 11/11/2020 0908   CHOLHDL 2.5 11/11/2020 0908   CHOLHDL 5.5 (H) 11/02/2016 1102   VLDL 80 (H) 11/02/2016 1102   LDLCALC 41 11/11/2020 0908    CBC    Component Value Date/Time   WBC 2.9 (L) 08/22/2021 0755   WBC 4.0 07/03/2021 1254   RBC 4.81 08/22/2021 0755   HGB 12.5 08/22/2021 0755   HCT 39.6 08/22/2021 0755   PLT 235 08/22/2021 0755   MCV 82.3 08/22/2021 0755   MCH 26.0 08/22/2021 0755   MCHC 31.6 08/22/2021 0755   RDW 12.9 08/22/2021 0755   LYMPHSABS 0.9 08/22/2021 0755   MONOABS 0.4 08/22/2021 0755   EOSABS 0.1 08/22/2021 0755   BASOSABS 0.0 08/22/2021 0755    ASSESSMENT AND PLAN: 1. Other acute nonsuppurative otitis media of right ear, recurrence not specified - amoxicillin-clavulanate (AUGMENTIN) 500-125 MG tablet; Take 1 tablet (500 mg total) by mouth 2 (two) times daily.  Dispense: 12 tablet; Refill: 0 Patient reports that she gets sores in her mouth whenever she takes antibiotics.  She tells me that her oncologist prescribes the medication for her so that this does not occur.  She has the bottle at home but does not recall the name.  She will call us back with that information.  2. Sinus congestion Continue OTC nasal spray   Patient was  given the opportunity to ask questions.  Patient verbalized understanding of the plan and was able to repeat key elements of the plan.  [employer] Language interpreter used during this encounter. #[id number], Rabia  No orders of the defined types were placed in this encounter.    Requested Prescriptions    No prescriptions requested or ordered in this  encounter    No follow-ups on file.  Karle Plumber, MD, FACP

## 2021-09-11 NOTE — Telephone Encounter (Signed)
Patient with continued right sided ear pain and sinus congestion. Dr. Isidore Moos recommended patient F/U with PCP since symptoms are not related to current radiation treatment. Patient requested assistance with getting scheduled at PCP's office given language barrier.   Informed that another provider at [facility] had an opening today at 11:10. Called and spoke with patient via interpreter line to confirm she would be able to make appointment. She verified that she would be able to attend appointment, and understood it would be with a different physician given having to be worked into the schedule. No other needs identified at this time.   Called [facility] office again to let staff know patient confirmed attendance

## 2021-09-12 ENCOUNTER — Ambulatory Visit: Payer: 59

## 2021-09-12 ENCOUNTER — Inpatient Hospital Stay: Payer: 59

## 2021-09-12 ENCOUNTER — Other Ambulatory Visit: Payer: Self-pay | Admitting: *Deleted

## 2021-09-12 ENCOUNTER — Inpatient Hospital Stay (HOSPITAL_BASED_OUTPATIENT_CLINIC_OR_DEPARTMENT_OTHER): Payer: 59 | Admitting: Oncology

## 2021-09-12 ENCOUNTER — Encounter: Payer: Self-pay | Admitting: General Practice

## 2021-09-12 ENCOUNTER — Other Ambulatory Visit: Payer: Self-pay | Admitting: Oncology

## 2021-09-12 VITALS — HR 82

## 2021-09-12 VITALS — BP 134/77 | HR 108 | Temp 97.9°F | Resp 18 | Ht 66.0 in | Wt 146.2 lb

## 2021-09-12 DIAGNOSIS — R739 Hyperglycemia, unspecified: Secondary | ICD-10-CM

## 2021-09-12 DIAGNOSIS — Z510 Encounter for antineoplastic radiation therapy: Secondary | ICD-10-CM | POA: Diagnosis not present

## 2021-09-12 DIAGNOSIS — C50311 Malignant neoplasm of lower-inner quadrant of right female breast: Secondary | ICD-10-CM | POA: Diagnosis not present

## 2021-09-12 DIAGNOSIS — Z170 Estrogen receptor positive status [ER+]: Secondary | ICD-10-CM

## 2021-09-12 DIAGNOSIS — Z95828 Presence of other vascular implants and grafts: Secondary | ICD-10-CM

## 2021-09-12 LAB — CBC WITH DIFFERENTIAL (CANCER CENTER ONLY)
Abs Immature Granulocytes: 0.01 10*3/uL (ref 0.00–0.07)
Basophils Absolute: 0 10*3/uL (ref 0.0–0.1)
Basophils Relative: 0 %
Eosinophils Absolute: 0.1 10*3/uL (ref 0.0–0.5)
Eosinophils Relative: 3 %
HCT: 40.3 % (ref 36.0–46.0)
Hemoglobin: 12.9 g/dL (ref 12.0–15.0)
Immature Granulocytes: 0 %
Lymphocytes Relative: 17 %
Lymphs Abs: 0.5 10*3/uL — ABNORMAL LOW (ref 0.7–4.0)
MCH: 26 pg (ref 26.0–34.0)
MCHC: 32 g/dL (ref 30.0–36.0)
MCV: 81.1 fL (ref 80.0–100.0)
Monocytes Absolute: 0.3 10*3/uL (ref 0.1–1.0)
Monocytes Relative: 11 %
Neutro Abs: 1.9 10*3/uL (ref 1.7–7.7)
Neutrophils Relative %: 69 %
Platelet Count: 209 10*3/uL (ref 150–400)
RBC: 4.97 MIL/uL (ref 3.87–5.11)
RDW: 13.2 % (ref 11.5–15.5)
WBC Count: 2.8 10*3/uL — ABNORMAL LOW (ref 4.0–10.5)
nRBC: 0 % (ref 0.0–0.2)

## 2021-09-12 LAB — CMP (CANCER CENTER ONLY)
ALT: 34 U/L (ref 0–44)
AST: 27 U/L (ref 15–41)
Albumin: 3.9 g/dL (ref 3.5–5.0)
Alkaline Phosphatase: 87 U/L (ref 38–126)
Anion gap: 14 (ref 5–15)
BUN: 14 mg/dL (ref 6–20)
CO2: 23 mmol/L (ref 22–32)
Calcium: 9.3 mg/dL (ref 8.9–10.3)
Chloride: 101 mmol/L (ref 98–111)
Creatinine: 0.8 mg/dL (ref 0.44–1.00)
GFR, Estimated: >60 mL/min (ref >60)
Glucose, Bld: 443 mg/dL — ABNORMAL HIGH (ref 70–99)
Potassium: 3.6 mmol/L (ref 3.5–5.1)
Sodium: 138 mmol/L (ref 135–145)
Total Bilirubin: 0.3 mg/dL (ref 0.3–1.2)
Total Protein: 7.3 g/dL (ref 6.5–8.1)

## 2021-09-12 LAB — TSH: TSH: 0.701 u[IU]/mL (ref 0.308–3.960)

## 2021-09-12 MED ORDER — SODIUM CHLORIDE 0.9 % IV SOLN
200.0000 mg | Freq: Once | INTRAVENOUS | Status: AC
Start: 2021-09-12 — End: 2021-09-12
  Administered 2021-09-12: 200 mg via INTRAVENOUS
  Filled 2021-09-12: qty 8

## 2021-09-12 MED ORDER — SODIUM CHLORIDE 0.9% FLUSH
10.0000 mL | Freq: Once | INTRAVENOUS | Status: AC
Start: 2021-09-12 — End: 2021-09-12
  Administered 2021-09-12: 10 mL

## 2021-09-12 MED ORDER — FLUCONAZOLE 100 MG PO TABS: 100.0000 mg | ORAL_TABLET | Freq: Every day | ORAL | 0 refills | Status: DC

## 2021-09-12 MED ORDER — INSULIN ASPART 100 UNIT/ML IJ SOLN
6.0000 [IU] | Freq: Once | INTRAMUSCULAR | Status: AC
  Administered 2021-09-12: 6 [IU] via SUBCUTANEOUS
  Filled 2021-09-12: qty 1

## 2021-09-12 MED ORDER — SODIUM CHLORIDE 0.9% FLUSH
10.0000 mL | INTRAVENOUS | Status: DC | PRN
Start: 2021-09-12 — End: 2021-09-12
  Administered 2021-09-12: 10 mL

## 2021-09-12 MED ORDER — ANTICOAGULANT SODIUM CITRATE LOCK FLUSH 4% (120 MG/3ML) IV SOLN
5.0000 mL | PREFILLED_SYRINGE | Freq: Once | INTRAVENOUS | Status: AC
  Administered 2021-09-12: 5 mL
  Filled 2021-09-12: qty 6

## 2021-09-12 MED ORDER — SODIUM CHLORIDE 0.9 % IV SOLN: Freq: Once | INTRAVENOUS | Status: AC

## 2021-09-12 NOTE — Patient Instructions (Signed)
[facility]  Discharge Instructions: Thank you for choosing [facility] to provide your oncology and hematology care.   If you have a lab appointment with the Cancer Center, please go directly to the Cancer Center and check in at the registration area.   Wear comfortable clothing and clothing appropriate for easy access to any Portacath or PICC line.   We strive to give you quality time with your provider. You may need to reschedule your appointment if you arrive late (15 or more minutes).  Arriving late affects you and other patients whose appointments are after yours.  Also, if you miss three or more appointments without notifying the office, you may be dismissed from the clinic at the provider's discretion.      For prescription refill requests, have your pharmacy contact our office and allow 72 hours for refills to be completed.    Today you received the following chemotherapy and/or immunotherapy agents: keytruda      To help prevent nausea and vomiting after your treatment, we encourage you to take your nausea medication as directed.  BELOW ARE SYMPTOMS THAT SHOULD BE REPORTED IMMEDIATELY: *FEVER GREATER THAN 100.4 F (38 C) OR HIGHER *CHILLS OR SWEATING *NAUSEA AND VOMITING THAT IS NOT CONTROLLED WITH YOUR NAUSEA MEDICATION *UNUSUAL SHORTNESS OF BREATH *UNUSUAL BRUISING OR BLEEDING *URINARY PROBLEMS (pain or burning when urinating, or frequent urination) *BOWEL PROBLEMS (unusual diarrhea, constipation, pain near the anus) TENDERNESS IN MOUTH AND THROAT WITH OR WITHOUT PRESENCE OF ULCERS (sore throat, sores in mouth, or a toothache) UNUSUAL RASH, SWELLING OR PAIN  UNUSUAL VAGINAL DISCHARGE OR ITCHING   Items with * indicate a potential emergency and should be followed up as soon as possible or go to the Emergency Department if any problems should occur.  Please show the CHEMOTHERAPY ALERT CARD or IMMUNOTHERAPY ALERT CARD at check-in to  the Emergency Department and triage nurse.  Should you have questions after your visit or need to cancel or reschedule your appointment, please contact [facility]  Dept: [phone]  and follow the prompts.  Office hours are 8:00 a.m. to 4:30 p.m. Monday - Friday. Please note that voicemails left after 4:00 p.m. may not be returned until the following business day.  We are closed weekends and major holidays. You have access to a nurse at all times for urgent questions. Please call the main number to the clinic Dept: [phone] and follow the prompts.   For any non-urgent questions, you may also contact your provider using MyChart. We now offer e-Visits for anyone 53 and older to request care online for non-urgent symptoms. For details visit [email or web address].   Also download the MyChart app! Go to the app store, search "MyChart", open the app, select , and log in with your MyChart username and password.  Due to Covid, a mask is required upon entering the hospital/clinic. If you do not have a mask, one will be given to you upon arrival. For doctor visits, patients may have 1 support person aged 53 or older with them. For treatment visits, patients cannot have anyone with them due to current Covid guidelines and our immunocompromised population.   

## 2021-09-12 NOTE — Progress Notes (Signed)
  I asked our social worker to find out why the patient's disability application was delayed.  Her very helpful note is below.   ---- Message ----- From: Justice Britain Sent: 09/12/2021  10:45 AM EST To: Kennith Center, LCSW, Chauncey Cruel, MD  Hi Dr Jana Hakim - its only Ander Purpura and myself these days - Sharyn Lull will be back mid December and Vernie Shanks is only PRN....  That said, I can talk w this patient in infusion today.  I did verify that [facility] filed her disability application May 8676.  They told me she has forms to complete on her work history and the impact of her disability  This is not something we can help her with - she will need to find someone to help her complete and return these forms.  [facility] will not assist either...  I imagine she has a friend or family member who speaks enough English to complete these forms.  In any case, that is her next step.  She would have received these forms in the last few days. The disability application process is very slow - taking months.  It looks like she has accessed all the other forms of financial assistance we would have for her both at the [facility] and through groups we know of.    Ill see what she is in need of at this point - and direct her to any possible sources of support in the community while she waits for a decision from [employer].  Webb Silversmith

## 2021-09-12 NOTE — Progress Notes (Signed)
[facility] CS[facility] Progress Notes  Request from Dr Jana Hakim to connect w patient to help her understand the disability process and what she needs to do next.  [facility], they filed her application in May 0141. They just received a copy of mail from DSS sent to patient - requests for work history and function reports.  These reports must be completed by patient and returned as directed to DSS.  Called patient w help of Hayden Melrose Nakayama [phone]).  Patient states she has already returned these forms.  Advised that her application is still in process and she must await a decision from [employer].  Provided number for [facility] so she can follow up w them w any further questions on her application.  Edwyna Shell, LCSW Clinical Social Worker Phone:  [phone]

## 2021-09-13 ENCOUNTER — Ambulatory Visit: Payer: 59

## 2021-09-18 ENCOUNTER — Ambulatory Visit: Payer: 59 | Admitting: Radiation Oncology

## 2021-09-18 ENCOUNTER — Ambulatory Visit
Admission: RE | Admit: 2021-09-18 | Discharge: 2021-09-18 | Disposition: A | Discharge status: Home / Self Care | Payer: 59 | Source: Ambulatory Visit | Attending: Radiation Oncology | Admitting: Radiation Oncology

## 2021-09-18 ENCOUNTER — Other Ambulatory Visit: Payer: Self-pay

## 2021-09-18 DIAGNOSIS — Z510 Encounter for antineoplastic radiation therapy: Secondary | ICD-10-CM | POA: Diagnosis not present

## 2021-09-18 DIAGNOSIS — C50311 Malignant neoplasm of lower-inner quadrant of right female breast: Secondary | ICD-10-CM

## 2021-09-18 MED ORDER — RADIAPLEXRX EX GEL: Freq: Once | CUTANEOUS | Status: AC

## 2021-09-19 ENCOUNTER — Ambulatory Visit
Admission: RE | Admit: 2021-09-19 | Discharge: 2021-09-19 | Disposition: A | Discharge status: Home / Self Care | Payer: 59 | Source: Ambulatory Visit | Attending: Radiation Oncology | Admitting: Radiation Oncology

## 2021-09-19 DIAGNOSIS — Z510 Encounter for antineoplastic radiation therapy: Secondary | ICD-10-CM | POA: Diagnosis not present

## 2021-09-20 ENCOUNTER — Ambulatory Visit
Admission: RE | Admit: 2021-09-20 | Discharge: 2021-09-20 | Disposition: A | Discharge status: Home / Self Care | Payer: 59 | Source: Ambulatory Visit | Attending: Radiation Oncology | Admitting: Radiation Oncology

## 2021-09-20 ENCOUNTER — Other Ambulatory Visit: Payer: Self-pay

## 2021-09-20 DIAGNOSIS — Z510 Encounter for antineoplastic radiation therapy: Secondary | ICD-10-CM | POA: Diagnosis not present

## 2021-09-21 ENCOUNTER — Ambulatory Visit: Payer: 59

## 2021-09-21 DIAGNOSIS — C50311 Malignant neoplasm of lower-inner quadrant of right female breast: Secondary | ICD-10-CM | POA: Insufficient documentation

## 2021-09-21 DIAGNOSIS — Z170 Estrogen receptor positive status [ER+]: Secondary | ICD-10-CM | POA: Diagnosis present

## 2021-09-22 ENCOUNTER — Other Ambulatory Visit: Payer: Self-pay

## 2021-09-22 ENCOUNTER — Ambulatory Visit: Payer: 59 | Admitting: Radiation Oncology

## 2021-09-22 DIAGNOSIS — C50311 Malignant neoplasm of lower-inner quadrant of right female breast: Secondary | ICD-10-CM | POA: Diagnosis not present

## 2021-09-25 ENCOUNTER — Telehealth: Payer: Self-pay

## 2021-09-25 ENCOUNTER — Other Ambulatory Visit: Payer: Self-pay

## 2021-09-25 ENCOUNTER — Ambulatory Visit
Admission: RE | Admit: 2021-09-25 | Discharge: 2021-09-25 | Disposition: A | Discharge status: Home / Self Care | Payer: 59 | Source: Ambulatory Visit | Attending: Radiation Oncology | Admitting: Radiation Oncology

## 2021-09-25 DIAGNOSIS — C50311 Malignant neoplasm of lower-inner quadrant of right female breast: Secondary | ICD-10-CM | POA: Diagnosis not present

## 2021-09-25 DIAGNOSIS — Z170 Estrogen receptor positive status [ER+]: Secondary | ICD-10-CM

## 2021-09-25 MED ORDER — RADIAPLEXRX EX GEL: Freq: Once | CUTANEOUS | Status: AC

## 2021-09-25 NOTE — Telephone Encounter (Signed)
Patient with continued bilateral ear pain and headache despite finishing oral antibiotic. Soonest appointment available with a provider at PCP's office is 10/12/2021. Advised by Dr. Margarita Rana to instruct patient to utilize mobile clinics. Called patient via interpreter line to inform her of times/locations of clinic for today and tomorrow. Patient stated she would try to be seen at clinic today before they close at 16:00, otherwise she will go to next location tomorrow after her radiation treatment. Will try to follow-up with patient tomorrow to see how she fared.

## 2021-09-26 ENCOUNTER — Ambulatory Visit: Payer: 59 | Admitting: Physician Assistant

## 2021-09-26 ENCOUNTER — Ambulatory Visit
Admission: RE | Admit: 2021-09-26 | Discharge: 2021-09-26 | Disposition: A | Discharge status: Home / Self Care | Payer: 59 | Source: Ambulatory Visit | Attending: Radiation Oncology | Admitting: Radiation Oncology

## 2021-09-26 DIAGNOSIS — H65191 Other acute nonsuppurative otitis media, right ear: Secondary | ICD-10-CM

## 2021-09-26 DIAGNOSIS — H6691 Otitis media, unspecified, right ear: Secondary | ICD-10-CM | POA: Insufficient documentation

## 2021-09-26 DIAGNOSIS — C50311 Malignant neoplasm of lower-inner quadrant of right female breast: Secondary | ICD-10-CM | POA: Diagnosis not present

## 2021-09-26 MED ORDER — FLUCONAZOLE 100 MG PO TABS: 100.0000 mg | ORAL_TABLET | Freq: Every day | ORAL | 0 refills | Status: DC

## 2021-09-26 MED ORDER — AMOXICILLIN-POT CLAVULANATE 500-125 MG PO TABS: 1.0000 | ORAL_TABLET | Freq: Two times a day (BID) | ORAL | 0 refills | Status: AC

## 2021-09-26 NOTE — Progress Notes (Signed)
Established Patient Office Visit  Subjective:  Patient ID: Rebecca [name], female    DOB: 1966-08-05  Age: 55 y.o. MRN: [id number]  CC:  Chief Complaint  Patient presents with   Ear Pain    HPI Rebecca [name] reports that she coninues to have right ear pain, depsite finishing the antibiotic that was prescribed by her PCP on 09/11/21.  States that she is not also having pain in her left ear.  States that she is not experiencing any other URI sxs.  Is currently receiving radiation therpay for breast cancer.  States that she does develop thrush when taking antibiotics and states that her oncologist did prescribe diflucan that she took during her last antibiotic prescription with relief.  Due to language barrier, an interpreter was present during the history-taking and subsequent discussion (and for part of the physical exam) with this patient.     Past Medical History:  Diagnosis Date   Arthritis    Breast cancer ([place])    Diabetes mellitus    Family history of lymphoma 01/18/2021   Family history of throat cancer 01/18/2021   Hyperlipidemia    Hypertension     Past Surgical History:  Procedure Laterality Date   BREAST LUMPECTOMY WITH RADIOACTIVE SEED AND SENTINEL LYMPH NODE BIOPSY Right 07/11/2021   Procedure: RIGHT BREAST LUMPECTOMY WITH RADIOACTIVE SEED AND RIGHT SENTINEL LYMPH NODE BIOPSY;  Surgeon: Erroll Luna, MD;  Location: [facility];  Service: General;  Laterality: Right;   CESAREAN SECTION     PORTACATH PLACEMENT N/A 01/26/2021   Procedure: INSERTION PORT-A-CATH;  Surgeon: Erroll Luna, MD;  Location: [facility];  Service: General;  Laterality: N/A;  55   [place] Right 07/11/2021   Procedure: RADIOACTIVE SEED GUIDED RIGHT AXILLARY LYMPH NODE BIOPSY;  Surgeon: Erroll Luna, MD;  Location: [facility];  Service: General;  Laterality: Right;    Family History  Problem Relation Age of  Onset   Diabetes Mother    Heart disease Mother    Diabetes Father    Throat cancer Father        d. 16s   Diabetes Sister    Diabetes Brother    Lymphoma Cousin        maternal cousin; d. 70s    Social History   Socioeconomic History   Marital status: Divorced    Spouse name: Not on file   Number of children: Not on file   Years of education: Not on file   Highest education level: Not on file  Occupational History   Not on file  Tobacco Use   Smoking status: Never    Passive exposure: Yes   Smokeless tobacco: Never  Vaping Use   Vaping Use: Never used  Substance and Sexual Activity   Alcohol use: No   Drug use: No   Sexual activity: Not Currently  Other Topics Concern   Not on file  Social History Narrative   Not on file   Social Determinants of Health   Financial Resource Strain: Not on file  Food Insecurity: Not on file  Transportation Needs: No Transportation Needs   Lack of Transportation (Medical): No   Lack of Transportation (Non-Medical): No  Physical Activity: Not on file  Stress: Not on file  Social Connections: Not on file  Intimate Partner Violence: Not on file    Outpatient Medications Prior to Visit  Medication Sig Dispense Refill   amLODipine (NORVASC) 10 MG  tablet TAKE 1 TABLET BY MOUTH EVERY DAY 90 tablet 0   Ascorbic Acid (VITAMIN C) 1000 MG tablet Take 1 tablet (1,000 mg total) by mouth daily. 30 tablet 0   atorvastatin (LIPITOR) 80 MG tablet TAKE 1 TABLET BY MOUTH EVERY DAY 90 tablet 1   Blood Glucose Monitoring Suppl (ONETOUCH VERIO REFLECT) w/Device KIT 1 kit by Does not apply route in the morning, at noon, and at bedtime. Use as directed to test blood sugar up to three times daily.E11.9 1 kit 0   cetirizine (ZYRTEC) 10 MG tablet Take 1 tablet (10 mg total) by mouth daily. 30 tablet 0   dapagliflozin propanediol (FARXIGA) 10 MG TABS tablet Take 1 tablet (10 mg total) by mouth daily with breakfast. 90 tablet 1   ergocalciferol (DRISDOL)  1.25 MG (50000 UT) capsule Take 1 capsule (50,000 Units total) by mouth once a week. 12 capsule 0   gabapentin (NEURONTIN) 100 MG capsule Take 1 capsule (100 mg total) by mouth 2 (two) times daily. Take early in the morning and at midday. 60 capsule 6   gabapentin (NEURONTIN) 300 MG capsule Take 1 capsule (300 mg total) by mouth at bedtime. 90 capsule 4   glipiZIDE (GLUCOTROL) 5 MG tablet TAKE 1 TABLET BY MOUTH DAILY 90 tablet 1   glucose blood (ONETOUCH VERIO) test strip Use as directed to test blood sugar up to three times daily. E11.9 100 each 6   labetalol (NORMODYNE) 100 MG tablet Take 100 mg by mouth 2 (two) times daily.     Lancets (ONETOUCH DELICA PLUS [id number]) MISC 1 Device by Does not apply route in the morning, at noon, and at bedtime. Use as directed to test blood sugar up to three times daily. E11.9 100 each 6   lidocaine-prilocaine (EMLA) cream APPLY TO AFFECTED AREA AS DIRECTED 30 g 3   metFORMIN (GLUCOPHAGE) 500 MG tablet TAKE 1 TABLET BY MOUTH TWICE DAILY 180 tablet 1   Omega-3 1000 MG CAPS Take 1 capsule (1,000 mg total) by mouth daily. 30 capsule 0   omeprazole (PRILOSEC) 20 MG capsule Take 1 capsule (20 mg total) by mouth daily. 30 capsule 0   vitamin B-12 (CYANOCOBALAMIN) 100 MCG tablet Take 1 tablet (100 mcg total) by mouth daily. 30 tablet 0   amoxicillin-clavulanate (AUGMENTIN) 500-125 MG tablet Take 1 tablet (500 mg total) by mouth 2 (two) times daily. 12 tablet 0   fluconazole (DIFLUCAN) 100 MG tablet Take 1 tablet (100 mg total) by mouth daily. 10 tablet 0   No facility-administered medications prior to visit.    Allergies  Allergen Reactions   Lisinopril Swelling    Swelling in throat, coughing, difficulty swallowing, could not sleep. Occurred Dec 2020.    ROS Review of Systems  Constitutional:  Negative for chills, fatigue and fever.  HENT:  Positive for ear pain. Negative for congestion, ear discharge, sinus pressure, sinus pain and sore throat.   Eyes:  Negative.   Respiratory:  Negative for cough, shortness of breath and wheezing.   Cardiovascular:  Negative for chest pain.  Gastrointestinal: Negative.   Endocrine: Negative.   Genitourinary: Negative.   Musculoskeletal: Negative.   Skin: Negative.   Allergic/Immunologic: Negative.   Neurological:  Negative for headaches.  Hematological: Negative.   Psychiatric/Behavioral: Negative.       Objective:    Physical Exam Vitals and nursing note reviewed.  Constitutional:      Appearance: Normal appearance.  HENT:     Head: Normocephalic and atraumatic.  Right Ear: Tympanic membrane is erythematous and bulging.     Left Ear: Tympanic membrane, ear canal and external ear normal.     Nose: Nose normal.     Mouth/Throat:     Mouth: Mucous membranes are moist.     Pharynx: Oropharynx is clear.  Eyes:     Extraocular Movements: Extraocular movements intact.     Conjunctiva/sclera: Conjunctivae normal.     Pupils: Pupils are equal, round, and reactive to light.  Cardiovascular:     Rate and Rhythm: Normal rate and regular rhythm.     Pulses: Normal pulses.     Heart sounds: Normal heart sounds.  Pulmonary:     Effort: Pulmonary effort is normal.     Breath sounds: Normal breath sounds.  Musculoskeletal:        General: Normal range of motion.     Cervical back: Normal range of motion and neck supple.  Lymphadenopathy:     Head:     Right side of head: Preauricular and posterior auricular adenopathy present.     Left side of head: Preauricular and posterior auricular adenopathy present.  Skin:    General: Skin is warm and dry.  Neurological:     General: No focal deficit present.     Mental Status: She is alert and oriented to person, place, and time.  Psychiatric:        Mood and Affect: Mood normal.        Behavior: Behavior normal.        Thought Content: Thought content normal.        Judgment: Judgment normal.    BP (!) 127/92 (BP Location: Left Arm, Patient  Position: Sitting, Cuff Size: Normal)   Pulse 72   Temp 98.2 F (36.8 C) (Oral)   Resp 18   Ht 5' 5" (1.651 m)   Wt 165 lb (74.8 kg)   SpO2 100%   BMI 27.46 kg/m  Wt Readings from Last 3 Encounters:  09/26/21 165 lb (74.8 kg)  09/12/21 146 lb 3.2 oz (66.3 kg)  09/11/21 149 lb 12.8 oz (67.9 kg)     Health Maintenance Due  Topic Date Due   OPHTHALMOLOGY EXAM  Never done   Hepatitis C Screening  Never done   Zoster Vaccines- Shingrix (1 of 2) Never done   COLONOSCOPY (Pts 45-52yr Insurance coverage will need to be confirmed)  Never done   Pneumococcal Vaccine [id number] old (2 - PCV) 06/13/2019   FOOT EXAM  11/03/2020   COVID-19 Vaccine (4 - Booster for Pfizer series) 03/15/2021   INFLUENZA VACCINE  Never done    There are no preventive care reminders to display for this patient.  Lab Results  Component Value Date   TSH 0.701 09/12/2021   Lab Results  Component Value Date   WBC 2.8 (L) 09/12/2021   HGB 12.9 09/12/2021   HCT 40.3 09/12/2021   MCV 81.1 09/12/2021   PLT 209 09/12/2021   Lab Results  Component Value Date   NA 138 09/12/2021   K 3.6 09/12/2021   CO2 23 09/12/2021   GLUCOSE 443 (H) 09/12/2021   BUN 14 09/12/2021   CREATININE 0.80 09/12/2021   BILITOT 0.3 09/12/2021   ALKPHOS 87 09/12/2021   AST 27 09/12/2021   ALT 34 09/12/2021   PROT 7.3 09/12/2021   ALBUMIN 3.9 09/12/2021   CALCIUM 9.3 09/12/2021   ANIONGAP 14 09/12/2021   EGFR 111 12/27/2020   Lab Results  Component Value  Date   CHOL 116 11/11/2020   Lab Results  Component Value Date   HDL 46 11/11/2020   Lab Results  Component Value Date   LDLCALC 41 11/11/2020   Lab Results  Component Value Date   TRIG 178 (H) 11/11/2020   Lab Results  Component Value Date   CHOLHDL 2.5 11/11/2020   Lab Results  Component Value Date   HGBA1C 7.5 (A) 06/29/2021      Assessment & Plan:   Problem List Items Addressed This Visit   None Visit Diagnoses     Other acute  nonsuppurative otitis media of right ear, recurrence not specified       Relevant Medications   amoxicillin-clavulanate (AUGMENTIN) 500-125 MG tablet   fluconazole (DIFLUCAN) 100 MG tablet       Meds ordered this encounter  Medications   amoxicillin-clavulanate (AUGMENTIN) 500-125 MG tablet    Sig: Take 1 tablet (500 mg total) by mouth 2 (two) times daily for 10 days.    Dispense:  20 tablet    Refill:  0    Order Specific Question:   Supervising Provider    Answer:   Elsie Stain [[id number]]   fluconazole (DIFLUCAN) 100 MG tablet    Sig: Take 1 tablet (100 mg total) by mouth daily.    Dispense:  10 tablet    Refill:  0    Order Specific Question:   Supervising Provider    Answer:   Joya Gaskins, PATRICK E [[id number]]   1. Other acute nonsuppurative otitis media of right ear, recurrence not specified Repeat Augmentin, Diflucan.  Patient education given on supportive care.  Red flags for prompt reevaluation. - amoxicillin-clavulanate (AUGMENTIN) 500-125 MG tablet; Take 1 tablet (500 mg total) by mouth 2 (two) times daily for 10 days.  Dispense: 20 tablet; Refill: 0 - fluconazole (DIFLUCAN) 100 MG tablet; Take 1 tablet (100 mg total) by mouth daily.  Dispense: 10 tablet; Refill: 0   I have reviewed the patient's medical history (PMH, PSH, Social History, Family History, Medications, and allergies) , and have been updated if relevant. I spent 20 minutes reviewing chart and  face to face time with patient.    Follow-up: Return if symptoms worsen or fail to improve.    Loraine Grip Mayers, PA-C

## 2021-09-26 NOTE — Patient Instructions (Signed)
You will take another course of antibiotics, twice a day for 10 days.  I also sent a prescription for you to have Diflucan for 10 days as well to help you with the adverse effects from the antibiotics.  I hope that you feel better soon, please let us know if there is anything else we can do for you.  Kennieth Rad, PA-C Physician Assistant [facility] [email or web address]   ?????? ????? ?????? ?? ???????? Otitis Media, Adult  ???? ?????? ????? ?????? ????? ???? ?????? ?????? ?? ????? ??????? ????? ??? ??????? ?????? ??? ??? ???? ???? ????. ?????? ?????? ???? ?? ????? ????? ??? ???? ????? ????? ??? ?????? ???? ?????? ??? ????? ??????? ??? ????. ?????? ?????? ?????? ???? ???? ?????? ?? ??? ??????? ???? ???? ????? ????? ?? ???? ??? ???? ?? ?????. ???? ???? ???????? ????? ?????? ?????? ?????? ?? ????? (??????? ??????) ????? ?? ?????? ???????? ????? ?????? ??? ????? ??????. ??? ?? ???? ?????? ???? ????????? ???? ???? ?? ?????? ?????? ????? ?????? ???????. ?? ????? ??? ??????? ???? ??? ?????? ???? ?????? ?? ???? ????????. ????? ?? ???? ??? ???? ?????? ?? ???? ??????. ????? ??????? ???? ???? ?? ???? ???????? ?? ???:  ??? ?? ???? ???? ?? ?????? ??????? ??????.  ????????.  ?????? ????? ?????? ??? ???? ?????.  ???? ???????. ??????? ?????? ?? ?????? ????? ??????? ?? ????? ?????? ?? ????? ????? ???? ??? ????? ??? ??? ????. ???? ??? ?? ???? ?????? ??????? ????? (?????? ???????).  ???? ?? ??????? ??????.  ??? ?? ????? ???? ?????? ?? ????? (?????? ????????). ?? ??????? ???? ???? ?? ????? ??????? ???? ??????? ????? ?????? ??????? ???? ?????? ?? ??????? ???????:  ??????? ?? ?????? ????? ?????.  ???? ?? ?? ??? ???? (????? ???????).  ??????? ????????? ?????? ???????.  ??????? ??????? ?? ???? ???????. ?? ?????? ??? ?????? ?? ???????? ???? ????? ??? ?????? ?? ???:  ??? ?? ?????.  ???.  ?????? ?? ????? ?????.  ??????? (??????).  ???? ??????? ??  ?????? ??? ????? ???? ????? ?? ??? ??? ??????.  ???? ?? ?????. ??? ????? ??? ???????  ????? ??? ?????? ?????? ????? ????? ??????. ?? ????? ????? ??????? ????? ??????? ?????? ????? ?? ???? ???? ????? ????? ????? ???? ????? ?????? ?? ???? ?????? ????? ??????. ?????? ?? ?????? ????? ?? ??????? ???? ????? ????. ?? ???? ??? ???? ??????? ?????? ????? ????? ????????? ???:  ????? ????? ???????. ???? ?????? ???? ???? ???? ?????. ????? ??? ???????? ???? ???? ????? ?? ?????? ???? ?????.  ?????? ??????. ???? ??? ????? ??? ????? ???? ???? ????? ??????? ???? ?????? ?? ???? ?????. ????? ??? ????? ????? ???????? ????? ??????? ?????? ????? ?? ????????. ??? ?????? ??? ??????? ???? ?? ????? ??? ?????? ?? ????? ????? ?? ????? ???? 3-5 ????. ??? ??? ???? ??? ?????? ???? ???? ??????? ??? ????? ?? ????? ?????? ?? ????? ??????? ????? ???? ???? ??????? ?????? ????? ?? ??? ???:  ??? ????? ???? ???? ?????? ??????.  ??? ????? ?????? ????? ?? ??????? ???. ???? ??? ????????? ?? ??????:  ????? ??????? ??? ??????? ???? ??????? ??????? ????? ????? ????? ???? ?? ??? ???? ????.  ??? ??? ?? ????? ??????? ?????? ?????? ??????? ???????? ??? ????????. ?? ????? ?? ????? ?????? ??????? ??? ??? ???? ???? ????? ?????.  ????? ????? ?????? ????????. ???? ??? ???. ???? ?????? ??????? ?????? ?? ??????? ???????:  ????? ????? ?? ????.  ???? ???? ?? ?????.  ?? ????? ????? ?? ????? 5 ????.  ????? ????? ???? ????? ?? ?? ?????. ???? ???????? ????? ?? ??????? ???????:  ????? ?? ??? ??? ?? ???? ??????? ???? ????????.  ????? ?? ?????? ?? ???? ?? ??? ?????? ?? ??????? ??????? ??????.  ?????? ????? ?? ?????.  ??? ????? ??? ????? ???? ?? ???? (???).  ?????? ???? ??? ??? ?????? ???????? ??? ????? (???????).  ?????? ????? ???. ????  ?????? ????? ?????? ?? ?????? ????? ????? ?? ????? ??????? ????? ????? ????? ???? ?? ?????.  ???? ?? ????? ??? ?????? ?? ????? ????? ?? ????? ????  3-5 ????.  ??? ?? ??? ??????? ?? ????? 3-5 ????? ??? ??? ?? ????  ??????? ?????? ????? ????? ??????.  ??? ??? ?? ????? ??????? ?????? ?????? ??????? ???????? ??? ????????.  ???? ???? ??????? ???? ??????? ??????. ??? ????? ?? ??? ????????? ?? ???? ?????? ????????? ???? ?????? ???? ??????? ??????. ???? ?? ?????? ??? ????? ???? ?? ???? ?? ???? ??????? ??????.? Document Revised: 02/13/2021 Document Reviewed: 02/13/2021 [employer] Patient Education  2022 [employer].

## 2021-09-26 NOTE — Progress Notes (Signed)
Patient reports ear pain still being present after completing antibiotics for ear infections. Patient denies muffled discomfort that was previously present.

## 2021-09-27 ENCOUNTER — Ambulatory Visit
Admission: RE | Admit: 2021-09-27 | Discharge: 2021-09-27 | Disposition: A | Discharge status: Home / Self Care | Payer: 59 | Source: Ambulatory Visit | Attending: Radiation Oncology | Admitting: Radiation Oncology

## 2021-09-27 ENCOUNTER — Ambulatory Visit: Payer: 59

## 2021-09-27 ENCOUNTER — Other Ambulatory Visit: Payer: Self-pay

## 2021-09-27 DIAGNOSIS — C50311 Malignant neoplasm of lower-inner quadrant of right female breast: Secondary | ICD-10-CM | POA: Diagnosis not present

## 2021-09-28 ENCOUNTER — Encounter: Payer: Self-pay | Admitting: *Deleted

## 2021-09-28 ENCOUNTER — Ambulatory Visit
Admission: RE | Admit: 2021-09-28 | Discharge: 2021-09-28 | Disposition: A | Discharge status: Home / Self Care | Payer: 59 | Source: Ambulatory Visit | Attending: Radiation Oncology | Admitting: Radiation Oncology

## 2021-09-28 ENCOUNTER — Ambulatory Visit: Payer: 59

## 2021-09-28 ENCOUNTER — Other Ambulatory Visit: Payer: Self-pay

## 2021-09-28 DIAGNOSIS — C50311 Malignant neoplasm of lower-inner quadrant of right female breast: Secondary | ICD-10-CM | POA: Diagnosis not present

## 2021-09-29 ENCOUNTER — Encounter: Payer: Self-pay | Admitting: Radiation Oncology

## 2021-09-29 ENCOUNTER — Ambulatory Visit
Admission: RE | Admit: 2021-09-29 | Discharge: 2021-09-29 | Disposition: A | Discharge status: Home / Self Care | Payer: 59 | Source: Ambulatory Visit | Attending: Radiation Oncology | Admitting: Radiation Oncology

## 2021-09-29 DIAGNOSIS — C50311 Malignant neoplasm of lower-inner quadrant of right female breast: Secondary | ICD-10-CM | POA: Diagnosis not present

## 2021-10-02 NOTE — Progress Notes (Signed)
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  She is accompanied by an interpreter Rebecca [name].  Rebecca [name] completed her adjuvant radiation therapy last week.  She is very fatigued and tearful about the level of fatigue she is experiencing.  She also is experiencing right ear pain.  She notes that she has completed a course of augmentin and the ear pain persisted. She wants to know what to do about it.  She also has some generalized arthralgias and is wondering if she can get a muscle relaxer for this.    At her last visit, her blood sugar was 443.  She says that she was told during chemotherapy to eat carbs and proteins, and she says that she started and now she can't stop.  She does not check her blood sugar at home and she has the supplies to do so.    She continues on pembrolizumab. She denies any new concerns related to the medication.    We are following her thyroid studies: Lab Results  Component Value Date   TSH 0.701 09/12/2021   TSH 1.404 08/22/2021   TSH 1.176 08/08/2021   TSH 0.930 08/01/2021   TSH 1.204 06/13/2021    REVIEW OF SYSTEMS: Review of Systems  Constitutional:  Positive for fatigue. Negative for appetite change, chills, fever and unexpected weight change.  HENT:    Negative for hearing loss, lump/mass, mouth sores, sore throat, tinnitus, trouble swallowing and voice change.   Eyes:  Negative for eye problems and icterus.  Respiratory:  Negative for chest tightness, cough and shortness of breath.   Cardiovascular:  Negative for chest pain, leg swelling and palpitations.  Gastrointestinal:  Negative for abdominal distention, abdominal pain, constipation, diarrhea, nausea and vomiting.  Endocrine: Negative for hot flashes.  Genitourinary:  Negative for difficulty urinating.   Musculoskeletal:  Positive for arthralgias.  Skin:  Negative for itching and rash.  Neurological:  Negative for dizziness, extremity weakness, headaches and numbness.  Hematological:  Negative for adenopathy. Does not bruise/bleed easily.  Psychiatric/Behavioral:  Negative for depression. The patient is not nervous/anxious.     COVID 19 VACCINATION STATUS: fully vaccinated [employer]), with booster 12/2020   HISTORY OF CURRENT ILLNESS: From the original intake note:  Rebecca [name] herself palpated a mass in the right axilla. She underwent bilateral diagnostic mammography with tomography and right breast ultrasonography at [facility] on 01/03/2021 showing: breast density category B; two adjacent suspicious right breast masses at 5 o'clock; markedly abnormal right axillary lymph node; no evidence of malignancy on left.  Accordingly on 01/06/2021 she proceeded to biopsy of the right breast area in question. The pathology from this procedure ([id number]) showed: invasive ductal carcinoma, grade 3. Prognostic indicators significant for: estrogen receptor, 10% positive with weak staining intensity and progesterone receptor, 0% negative. Proliferation marker Ki67 at 90%. HER2 equivocal by immunohistochemistry (2+), but negative by fluorescent in  situ hybridization with a signals ratio 1.38 and number per cell 3.63.   Cancer Staging  Malignant neoplasm of lower-inner quadrant of  right breast of female, estrogen receptor positive ([name]) Staging form: Breast, AJCC 8th Edition - Clinical: Stage IIIB (cT2, cN1, cM0, G3, ER-, PR-, HER2-) - Signed by Chauncey Cruel, MD on 01/18/2021 Stage prefix: Initial diagnosis Histologic grading system: 3 grade system  [name] is as detailed below.   PAST MEDICAL HISTORY: Past Medical History:  Diagnosis Date   Arthritis    Breast cancer ([place])    Diabetes mellitus    Family history of lymphoma 01/18/2021   Family history of throat cancer 01/18/2021   Hyperlipidemia    Hypertension     PAST SURGICAL HISTORY: Past Surgical History:  Procedure Laterality Date   BREAST LUMPECTOMY WITH RADIOACTIVE SEED AND SENTINEL LYMPH NODE BIOPSY Right 07/11/2021   Procedure: RIGHT BREAST LUMPECTOMY WITH RADIOACTIVE SEED AND RIGHT SENTINEL LYMPH NODE BIOPSY;  Surgeon: Rebecca Luna, MD;  Location: [facility];  Service: General;  Laterality: Right;   [place] N/A 01/26/2021   Procedure: INSERTION PORT-A-CATH;  Surgeon: Rebecca Luna, MD;  Location: [facility];  Service: General;  Laterality: N/A;  [place] Right 07/11/2021   Procedure: RADIOACTIVE SEED GUIDED RIGHT AXILLARY LYMPH NODE BIOPSY;  Surgeon: Rebecca Luna, MD;  Location: [facility];  Service: General;  Laterality: Right;    FAMILY HISTORY: Family History  Problem Relation Age of Onset   Diabetes Mother    Heart disease Mother    Diabetes Father    Throat cancer Father        d. 43s   Diabetes Sister    Diabetes Brother    Lymphoma Cousin        maternal cousin; d. 56s   Her father died in his 16's and her mother in her 86'V, both from complications of diabetes. Rebecca [name] has 6 brothers and 1 sister. There is no family history of breast or ovarian cancer to her knowledge.   GYNECOLOGIC HISTORY:  No LMP recorded. Patient is  perimenopausal. Menarche: 55 years old [place] P 0 LMP 10/31/2020 (before that, 06/2020) Contraceptive used from 2017-2018 HRT n/a  Hysterectomy? no BSO? no   SOCIAL HISTORY: (updated 12/2020)  Rebecca [name] is originally from [place]. 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 She is divorced. She lives at home with her nephew [name], who is 36 and works for [employer]. She attends [employer]    ADVANCED DIRECTIVES: not in place; at the 01/18/2021 visit she stated her ex-husband [name], who is disabled due to past injuries, ([phone]) would be the person to contact if she became incapacitated [the patient states aside from her nephew she has no other relatives in [place]: Social History   Tobacco Use   Smoking status: Never    Passive exposure: Yes   Smokeless tobacco: Never  Vaping Use   Vaping Use: Never used  Substance Use Topics   Alcohol use: No   Drug use: No     Colonoscopy: never done, referral in place  PAP: 12/2020, negative  Bone density: never done   Allergies  Allergen Reactions   Lisinopril Swelling    Swelling in throat, coughing, difficulty swallowing, could not sleep. Occurred Dec 2020.  Current Outpatient Medications  Medication Sig Dispense Refill   amLODipine (NORVASC) 10 MG tablet TAKE 1 TABLET BY MOUTH EVERY DAY 90 tablet 0   amoxicillin-clavulanate (AUGMENTIN) 500-125 MG tablet Take 1 tablet (500 mg total) by mouth 2 (two) times daily for 10 days. 20 tablet 0   Ascorbic Acid (VITAMIN C) 1000 MG tablet Take 1 tablet (1,000 mg total) by mouth daily. 30 tablet 0   atorvastatin (LIPITOR) 80 MG tablet TAKE 1 TABLET BY MOUTH EVERY DAY 90 tablet 1   Blood Glucose Monitoring Suppl (ONETOUCH VERIO REFLECT) w/Device KIT 1 kit by Does not apply route in the morning, at noon, and at bedtime. Use as directed to test blood sugar up to three times  daily.E11.9 1 kit 0   cetirizine (ZYRTEC) 10 MG tablet Take 1 tablet (10 mg total) by mouth daily. 30 tablet 0   dapagliflozin propanediol (FARXIGA) 10 MG TABS tablet Take 1 tablet (10 mg total) by mouth daily with breakfast. 90 tablet 1   ergocalciferol (DRISDOL) 1.25 MG (50000 UT) capsule Take 1 capsule (50,000 Units total) by mouth once a week. 12 capsule 0   fluconazole (DIFLUCAN) 100 MG tablet Take 1 tablet (100 mg total) by mouth daily. 10 tablet 0   gabapentin (NEURONTIN) 100 MG capsule Take 1 capsule (100 mg total) by mouth 2 (two) times daily. Take early in the morning and at midday. 60 capsule 6   gabapentin (NEURONTIN) 300 MG capsule Take 1 capsule (300 mg total) by mouth at bedtime. 90 capsule 4   glipiZIDE (GLUCOTROL) 5 MG tablet TAKE 1 TABLET BY MOUTH DAILY 90 tablet 1   glucose blood (ONETOUCH VERIO) test strip Use as directed to test blood sugar up to three times daily. E11.9 100 each 6   labetalol (NORMODYNE) 100 MG tablet Take 100 mg by mouth 2 (two) times daily.     Lancets (ONETOUCH DELICA PLUS LOVFIE33I) MISC 1 Device by Does not apply route in the morning, at noon, and at bedtime. Use as directed to test blood sugar up to three times daily. E11.9 100 each 6   lidocaine-prilocaine (EMLA) cream APPLY TO AFFECTED AREA AS DIRECTED 30 g 3   metFORMIN (GLUCOPHAGE) 500 MG tablet TAKE 1 TABLET BY MOUTH TWICE DAILY 180 tablet 1   Omega-3 1000 MG CAPS Take 1 capsule (1,000 mg total) by mouth daily. 30 capsule 0   omeprazole (PRILOSEC) 20 MG capsule Take 1 capsule (20 mg total) by mouth daily. 30 capsule 0   vitamin B-12 (CYANOCOBALAMIN) 100 MCG tablet Take 1 tablet (100 mcg total) by mouth daily. 30 tablet 0   No current facility-administered medications for this visit.    OBJECTIVE: Rebecca [name] woman who appears stated age  There were no vitals filed for this visit.     There is no height or weight on file to calculate BMI.   Wt Readings from Last 3 Encounters:  09/26/21 165 lb  (74.8 kg)  09/12/21 146 lb 3.2 oz (66.3 kg)  09/11/21 149 lb 12.8 oz (67.9 kg)      ECOG FS:1 - Symptomatic but completely ambulatory  GENERAL: Patient is a well appearing female in no acute distress HEENT:  Sclerae anicteric.  Oropharynx clear and moist. No ulcerations or evidence of oropharyngeal candidiasis. Neck is supple. TM bilaterally visible, + fluid, no erythema, BLM visible NODES:  No cervical, supraclavicular, or axillary lymphadenopathy palpated.  BREAST EXAM:  Deferred. LUNGS:  Clear to auscultation bilaterally.  No wheezes or rhonchi.  HEART:  Regular rate and rhythm. No murmur appreciated. ABDOMEN:  Soft, nontender.  Positive, normoactive bowel sounds. No organomegaly palpated. MSK:  No focal spinal tenderness to palpation. Full range of motion bilaterally in the upper extremities. EXTREMITIES:  No peripheral edema.   SKIN:  Clear with no obvious rashes or skin changes. No nail dyscrasia. NEURO:  Nonfocal. Well oriented.  Appropriate affect.  LAB RESULTS:  CMP     Component Value Date/Time   NA 138 09/12/2021 0837   NA 140 12/27/2020 1123   K 3.6 09/12/2021 0837   CL 101 09/12/2021 0837   CO2 23 09/12/2021 0837   GLUCOSE 443 (H) 09/12/2021 0837   BUN 14 09/12/2021 0837   BUN 11 12/27/2020 1123   CREATININE 0.80 09/12/2021 0837   CREATININE 0.50 11/02/2016 1102   CALCIUM 9.3 09/12/2021 0837   PROT 7.3 09/12/2021 0837   PROT 7.5 12/27/2020 1123   ALBUMIN 3.9 09/12/2021 0837   ALBUMIN 4.7 12/27/2020 1123   AST 27 09/12/2021 0837   ALT 34 09/12/2021 0837   ALKPHOS 87 09/12/2021 0837   BILITOT 0.3 09/12/2021 0837   GFRNONAA >60 09/12/2021 0837   GFRNONAA >89 01/16/2016 1006   GFRAA 118 06/02/2020 0942   GFRAA >89 01/16/2016 1006    No results found for: TOTALPROTELP, ALBUMINELP, A1GS, A2GS, BETS, BETA2SER, GAMS, MSPIKE, SPEI  Lab Results  Component Value Date   WBC 2.8 (L) 09/12/2021   NEUTROABS 1.9 09/12/2021   HGB 12.9 09/12/2021   HCT 40.3  09/12/2021   MCV 81.1 09/12/2021   PLT 209 09/12/2021    No results found for: LABCA2  No components found for: [id number]  No results for input(s): INR in the last 168 hours.  No results found for: LABCA2  No results found for: MBW466  No results found for: ZLD357  No results found for: SVX793  No results found for: CA2729  No components found for: HGQUANT  No results found for: CEA1 / No results found for: CEA1   No results found for: AFPTUMOR  No results found for: CHROMOGRNA  No results found for: KPAFRELGTCHN, LAMBDASER, KAPLAMBRATIO (kappa/lambda light chains)  No results found for: HGBA, HGBA2QUANT, HGBFQUANT, HGBSQUAN (Hemoglobinopathy evaluation)   No results found for: LDH  No results found for: IRON, TIBC, IRONPCTSAT (Iron and TIBC)  No results found for: FERRITIN  Urinalysis    Component Value Date/Time   COLORURINE YELLOW 06/24/2007 0116   APPEARANCEUR CLOUDY (A) 06/24/2007 0116   LABSPEC 1.025 09/18/2020 1125   PHURINE 5.5 09/18/2020 1125   GLUCOSEU 100 (A) 09/18/2020 1125   HGBUR TRACE (A) 09/18/2020 1125   BILIRUBINUR NEGATIVE 09/18/2020 1125   KETONESUR NEGATIVE 09/18/2020 1125   PROTEINUR NEGATIVE 09/18/2020 1125   UROBILINOGEN 0.2 09/18/2020 1125   NITRITE NEGATIVE 09/18/2020 1125   LEUKOCYTESUR NEGATIVE 09/18/2020 1125    STUDIES: No results found.   ELIGIBLE FOR AVAILABLE RESEARCH PROTOCOL: no  ASSESSMENT: 55 y.o. 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, grade 3, with and MIB-1 of 90%  (a) chest CT scan 01/29/2021 shows no evidence of metastatic disease  (b) bone scan 02/01/2021 shows no evidence of metastatic disease  (1) neoadjuvant chemotherapy consisting of pembrolizumab given day 1 together with carboplatin and paclitaxel, with Taxol given on day 8 and 15, and filgrastim on days 16,17 and 18,  started 02/07/2021, repeated every 21 days x 2, followed  by doxorubicin and cyclophosphamide every 21 days x 4 also given with pembrolizumab on day 1 and PEG filgrastim day 3, started 03/22/2021 and completed 05/23/2021  (a) carboplatin/paclitaxel discontinued after the cycle 2-day 8 dose with neuropathy developing (subsequently resolved)   (2) pembrolizumab continuing to total 1 year  (3) right lumpectomy and sentinel lymph node sampling 07/11/2021 showed a complete pathologic response (ypT0 ypN0)  (a) a total of 3 right axillary lymph nodes were removed  (4) adjuvant radiation to be completed 09/27/2021  (5) genetics testing 01/31/2021 through the [employer] Panel found no deleterious mutations in AIP, ALK, APC, ATM, AXIN2, BAP1, BARD1, BLM, BMPR1A, BRCA1, BRCA2, BRIP1, CDC73, CDH1, CDK4, CDKN1B, CDKN2A, CHEK2, CTNNA1, DICER1, FANCC, FH, FLCN, GALNT12, KIF1B, LZTR1, MAX, MEN1, MET, MLH1, MSH2, MSH3, MSH6, MUTYH, NBN, NF1, NF2, NTHL1, PALB2, PHOX2B, PMS2, POT1, PRKAR1A, PTCH1, PTEN, RAD51C, RAD51D, RB1, RECQL, RET, SDHA, SDHAF2, SDHB, SDHC, SDHD, SMAD4, SMARCA4, SMARCB1, SMARCE1, STK11, SUFU, TMEM127, TP53, TSC1, TSC2, VHL and XRCC2 (sequencing and deletion/duplication); EGFR, EGLN1, HOXB13, KIT, MITF, PDGFRA, POLD1, and POLE (sequencing only); EPCAM and GREM1 (deletion/duplication only).   PLAN: Trenell is feeling moderately well today.  She has completed her adjuvant radiation and continues on Pebrolizumab.  Her increased fatigue is secondary to the radiation therapy, and should improve slowly with time.  I encouraged small amounts of physical activity to help with her fatigue.    Her ear pain could very well be eustachian tube dysfunction.  Since this is recurrent and accompanied with headaches, and considering the fact that she had stage III cancer when she was diagnosed I have ordered a brain MRI to rule out any malignancy.  I also recommended that she use flonase daily  and astelin bid to help with her sinuses and to decrease inflammation in the eustachian tube.    For her achiness, I recommended that she take one extra strength tylenol 552m and one aleve 2238mtogether as much as TID PRN.    We will see Enza back in 3 weeks for labs, f/u and her next treatment.  She knows to call for any questions that may arise between now and her next appointment.  We are happy to see her sooner if needed.  Total encounter time 30 minutes.* in face to face visit time, chart review, lab review, order entry, care coordination, and documentation of the encounter.   LiWilber BihariNP 10/02/21 11:36 PM Medical Oncology and Hematology [facility]4[place] [id number]. [phone]  Fax. [phone] *Total Encounter Time as defined by the Centers for Medicare and Medicaid Services includes, in addition to the face-to-face time of a patient visit (documented in the note above) non-face-to-face time: obtaining and reviewing outside history, ordering and reviewing medications, tests or procedures, care coordination (communications with other health care professionals or caregivers) and documentation in the medical record.

## 2021-10-03 ENCOUNTER — Other Ambulatory Visit: Payer: Self-pay

## 2021-10-03 ENCOUNTER — Inpatient Hospital Stay (HOSPITAL_BASED_OUTPATIENT_CLINIC_OR_DEPARTMENT_OTHER): Payer: 59 | Admitting: Adult Health

## 2021-10-03 ENCOUNTER — Inpatient Hospital Stay: Payer: 59

## 2021-10-03 ENCOUNTER — Other Ambulatory Visit: Payer: 59

## 2021-10-03 ENCOUNTER — Inpatient Hospital Stay: Payer: 59 | Attending: Oncology

## 2021-10-03 ENCOUNTER — Encounter: Payer: Self-pay | Admitting: Adult Health

## 2021-10-03 VITALS — HR 90

## 2021-10-03 VITALS — BP 131/80 | HR 103 | Temp 97.9°F | Resp 18 | Ht 65.0 in | Wt 146.3 lb

## 2021-10-03 DIAGNOSIS — Z95828 Presence of other vascular implants and grafts: Secondary | ICD-10-CM

## 2021-10-03 DIAGNOSIS — C50311 Malignant neoplasm of lower-inner quadrant of right female breast: Secondary | ICD-10-CM

## 2021-10-03 DIAGNOSIS — Z170 Estrogen receptor positive status [ER+]: Secondary | ICD-10-CM

## 2021-10-03 DIAGNOSIS — Z5112 Encounter for antineoplastic immunotherapy: Secondary | ICD-10-CM | POA: Diagnosis not present

## 2021-10-03 DIAGNOSIS — Z171 Estrogen receptor negative status [ER-]: Secondary | ICD-10-CM | POA: Diagnosis not present

## 2021-10-03 DIAGNOSIS — Z79899 Other long term (current) drug therapy: Secondary | ICD-10-CM | POA: Insufficient documentation

## 2021-10-03 LAB — CBC WITH DIFFERENTIAL (CANCER CENTER ONLY)
Abs Immature Granulocytes: 0.02 10*3/uL (ref 0.00–0.07)
Basophils Absolute: 0 10*3/uL (ref 0.0–0.1)
Basophils Relative: 1 %
Eosinophils Absolute: 0.1 10*3/uL (ref 0.0–0.5)
Eosinophils Relative: 3 %
HCT: 40.1 % (ref 36.0–46.0)
Hemoglobin: 13.2 g/dL (ref 12.0–15.0)
Immature Granulocytes: 1 %
Lymphocytes Relative: 17 %
Lymphs Abs: 0.5 10*3/uL — ABNORMAL LOW (ref 0.7–4.0)
MCH: 26.1 pg (ref 26.0–34.0)
MCHC: 32.9 g/dL (ref 30.0–36.0)
MCV: 79.4 fL — ABNORMAL LOW (ref 80.0–100.0)
Monocytes Absolute: 0.4 10*3/uL (ref 0.1–1.0)
Monocytes Relative: 13 %
Neutro Abs: 2 10*3/uL (ref 1.7–7.7)
Neutrophils Relative %: 65 %
Platelet Count: 213 10*3/uL (ref 150–400)
RBC: 5.05 MIL/uL (ref 3.87–5.11)
RDW: 13.7 % (ref 11.5–15.5)
WBC Count: 3 10*3/uL — ABNORMAL LOW (ref 4.0–10.5)
nRBC: 0 % (ref 0.0–0.2)

## 2021-10-03 LAB — CMP (CANCER CENTER ONLY)
ALT: 37 U/L (ref 0–44)
AST: 31 U/L (ref 15–41)
Albumin: 4 g/dL (ref 3.5–5.0)
Alkaline Phosphatase: 82 U/L (ref 38–126)
Anion gap: 13 (ref 5–15)
BUN: 14 mg/dL (ref 6–20)
CO2: 24 mmol/L (ref 22–32)
Calcium: 9.5 mg/dL (ref 8.9–10.3)
Chloride: 102 mmol/L (ref 98–111)
Creatinine: 0.63 mg/dL (ref 0.44–1.00)
GFR, Estimated: >60 mL/min (ref >60)
Glucose, Bld: 172 mg/dL — ABNORMAL HIGH (ref 70–99)
Potassium: 3.8 mmol/L (ref 3.5–5.1)
Sodium: 139 mmol/L (ref 135–145)
Total Bilirubin: 0.4 mg/dL (ref 0.3–1.2)
Total Protein: 7.5 g/dL (ref 6.5–8.1)

## 2021-10-03 LAB — TSH: TSH: 0.896 u[IU]/mL (ref 0.308–3.960)

## 2021-10-03 MED ORDER — SODIUM CHLORIDE 0.9 % IV SOLN
200.0000 mg | Freq: Once | INTRAVENOUS | Status: AC
  Administered 2021-10-03: 200 mg via INTRAVENOUS
  Filled 2021-10-03: qty 8

## 2021-10-03 MED ORDER — SODIUM CHLORIDE 0.9 % IV SOLN: Freq: Once | INTRAVENOUS | Status: AC

## 2021-10-03 MED ORDER — AZELASTINE HCL 0.1 % NA SOLN: 2.0000 | Freq: Two times a day (BID) | NASAL | 12 refills | Status: DC

## 2021-10-03 MED ORDER — ACETAMINOPHEN 500 MG PO TABS: 500.0000 mg | ORAL_TABLET | Freq: Three times a day (TID) | ORAL | 0 refills | Status: AC | PRN

## 2021-10-03 MED ORDER — SODIUM CHLORIDE 0.9% FLUSH
10.0000 mL | Freq: Once | INTRAVENOUS | Status: AC
  Administered 2021-10-03: 10 mL

## 2021-10-03 MED ORDER — SODIUM CHLORIDE 0.9% FLUSH
10.0000 mL | INTRAVENOUS | Status: DC | PRN
  Administered 2021-10-03: 10 mL

## 2021-10-03 MED ORDER — ANTICOAGULANT SODIUM CITRATE LOCK FLUSH 4% (120 MG/3ML) IV SOLN
5.0000 mL | PREFILLED_SYRINGE | Freq: Once | INTRAVENOUS | Status: AC
  Administered 2021-10-03: 5 mL
  Filled 2021-10-03: qty 6

## 2021-10-03 MED ORDER — NAPROXEN SODIUM 220 MG PO TABS: 220.0000 mg | ORAL_TABLET | Freq: Three times a day (TID) | ORAL | Status: DC | PRN

## 2021-10-03 MED ORDER — FLUTICASONE PROPIONATE 50 MCG/ACT NA SUSP: 2.0000 | Freq: Every day | NASAL | 2 refills | Status: DC

## 2021-10-03 NOTE — Patient Instructions (Signed)
[facility]ICAL [facility]  Discharge Instructions: Thank you for choosing [facility] to provide your oncology and hematology care.   If you have a lab appointment with the Cancer Center, please go directly to the Cancer Center and check in at the registration area.   Wear comfortable clothing and clothing appropriate for easy access to any Portacath or PICC line.   We strive to give you quality time with your provider. You may need to reschedule your appointment if you arrive late (15 or more minutes).  Arriving late affects you and other patients whose appointments are after yours.  Also, if you miss three or more appointments without notifying the office, you may be dismissed from the clinic at the provider's discretion.      For prescription refill requests, have your pharmacy contact our office and allow 72 hours for refills to be completed.    Today you received the following chemotherapy and/or immunotherapy agents: keytruda      To help prevent nausea and vomiting after your treatment, we encourage you to take your nausea medication as directed.  BELOW ARE SYMPTOMS THAT SHOULD BE REPORTED IMMEDIATELY: *FEVER GREATER THAN 100.4 F (38 C) OR HIGHER *CHILLS OR SWEATING *NAUSEA AND VOMITING THAT IS NOT CONTROLLED WITH YOUR NAUSEA MEDICATION *UNUSUAL SHORTNESS OF BREATH *UNUSUAL BRUISING OR BLEEDING *URINARY PROBLEMS (pain or burning when urinating, or frequent urination) *BOWEL PROBLEMS (unusual diarrhea, constipation, pain near the anus) TENDERNESS IN MOUTH AND THROAT WITH OR WITHOUT PRESENCE OF ULCERS (sore throat, sores in mouth, or a toothache) UNUSUAL RASH, SWELLING OR PAIN  UNUSUAL VAGINAL DISCHARGE OR ITCHING   Items with * indicate a potential emergency and should be followed up as soon as possible or go to the Emergency Department if any problems should occur.  Please show the CHEMOTHERAPY ALERT CARD or IMMUNOTHERAPY ALERT CARD at check-in to  the Emergency Department and triage nurse.  Should you have questions after your visit or need to cancel or reschedule your appointment, please contact [facility]  Dept: [phone]  and follow the prompts.  Office hours are 8:00 a.m. to 4:30 p.m. Monday - Friday. Please note that voicemails left after 4:00 p.m. may not be returned until the following business day.  We are closed weekends and major holidays. You have access to a nurse at all times for urgent questions. Please call the main number to the clinic Dept: [phone] and follow the prompts.   For any non-urgent questions, you may also contact your provider using MyChart. We now offer e-Visits for anyone 18 and older to request care online for non-urgent symptoms. For details visit [email or web address].   Also download the MyChart app! Go to the app store, search "MyChart", open the app, select , and log in with your MyChart username and password.  Due to Covid, a mask is required upon entering the hospital/clinic. If you do not have a mask, one will be given to you upon arrival. For doctor visits, patients may have 1 support person aged 18 or older with them. For treatment visits, patients cannot have anyone with them due to current Covid guidelines and our immunocompromised population.   

## 2021-10-08 ENCOUNTER — Other Ambulatory Visit: Payer: Self-pay

## 2021-10-09 ENCOUNTER — Ambulatory Visit: Payer: 59 | Attending: Surgery | Admitting: Physical Therapy

## 2021-10-09 ENCOUNTER — Other Ambulatory Visit: Payer: Self-pay

## 2021-10-09 DIAGNOSIS — Z483 Aftercare following surgery for neoplasm: Secondary | ICD-10-CM | POA: Insufficient documentation

## 2021-10-09 NOTE — Therapy (Signed)
[facility] @ [address] [place] [place], [place], [address] Phone: [phone]   Fax:  [phone]  Physical Therapy Treatment  Patient Details  Name: Rebecca [name] MRN: [id number] Date of Birth: [birth year] Referring Provider (PT): Dr. Erroll Luna   Encounter Date: 10/09/2021   PT End of Session - 10/09/21 0946     Visit Number 2    Number of Visits 2    PT Start Time 0931    PT Stop Time 0942    PT Time Calculation (min) 11 min    Activity Tolerance Patient tolerated treatment well    Behavior During Therapy [facility] for tasks assessed/performed             Past Medical History:  Diagnosis Date   Arthritis    Breast cancer ([place])    Diabetes mellitus    Family history of lymphoma 01/18/2021   Family history of throat cancer 01/18/2021   Hyperlipidemia    Hypertension     Past Surgical History:  Procedure Laterality Date   BREAST LUMPECTOMY WITH RADIOACTIVE SEED AND SENTINEL LYMPH NODE BIOPSY Right 07/11/2021   Procedure: RIGHT BREAST LUMPECTOMY WITH RADIOACTIVE SEED AND RIGHT SENTINEL LYMPH NODE BIOPSY;  Surgeon: Erroll Luna, MD;  Location: [facility];  Service: General;  Laterality: Right;   CESAREAN SECTION     PORTACATH PLACEMENT N/A 01/26/2021   Procedure: INSERTION PORT-A-CATH;  Surgeon: Erroll Luna, MD;  Location: [facility];  Service: General;  Laterality: N/A;  60   RADIOACTIVE SEED GUIDED AXILLARY SENTINEL LYMPH NODE Right 07/11/2021   Procedure: RADIOACTIVE SEED GUIDED RIGHT AXILLARY LYMPH NODE BIOPSY;  Surgeon: Erroll Luna, MD;  Location: [facility];  Service: General;  Laterality: Right;    There were no vitals filed for this visit.   Subjective Assessment - 10/09/21 0945     Subjective Patient reports she is here today for her SOZO screen                    L-DEX FLOWSHEETS - 10/09/21 0900       L-DEX LYMPHEDEMA SCREENING   Measurement Type  Unilateral   Done on 01/18/2021; carrying forward here   L-DEX MEASUREMENT EXTREMITY Upper Extremity    POSITION  Standing    DOMINANT SIDE Left    At Risk Side Right    BASELINE SCORE (UNILATERAL) -0.8    L-DEX SCORE (UNILATERAL) 3.3    VALUE CHANGE (UNILAT) 4.1                                     PT Long Term Goals - 08/10/21 1418       PT LONG TERM GOAL #1   Title Patient will demonstrate she has regained full shoulder ROM and function post operatively compared to baselines.    Time 6    Period Months    Status Achieved                   Plan - 10/09/21 0946     Clinical Impression Statement Patient had a SOZO score change of 4.1 so she will return in 3 months for another screening.    PT Next Visit Plan Continue SOZO screens    Consulted and Agree with Plan of Care Patient;Other (Comment)   Interpreter  Patient will benefit from skilled therapeutic intervention in order to improve the following deficits and impairments:     Visit Diagnosis: Aftercare following surgery for neoplasm     Problem List Patient Active Problem List   Diagnosis Date Noted   Right otitis media 09/26/2021   Port-A-Cath in place 02/07/2021   Genetic testing 02/02/2021   Family history of throat cancer 01/18/2021   Family history of lymphoma 01/18/2021   Malignant neoplasm of lower-inner quadrant of right breast of female, estrogen receptor positive ([place]) 01/12/2021   Posterior tibial tendinitis, right leg 01/26/2020   Diabetes ([place]) 03/11/2017   Muscle pain 08/06/2013   Tiredness 08/06/2013   [place] vaginitis 08/19/2012   Dental cavities 08/19/2012   Knee pain, bilateral 04/16/2012   Diabetes mellitus type 2, controlled ([place]) 04/16/2012   Hyperlipidemia 04/16/2012   Palpitations 04/16/2012   Back pain 04/16/2012   Annia Friendly, PT 10/09/21 9:53 AM   [facility] @ [place]  [place], [place], [address] Phone: [phone]   Fax:  [phone]  Name: Rebecca [name] MRN: [id number] Date of Birth: 1966/10/22

## 2021-10-12 ENCOUNTER — Ambulatory Visit: Payer: 59 | Attending: Physician Assistant | Admitting: Physician Assistant

## 2021-10-12 ENCOUNTER — Other Ambulatory Visit: Payer: Self-pay

## 2021-10-12 VITALS — BP 130/83 | HR 104 | Ht 63.5 in | Wt 149.0 lb

## 2021-10-12 DIAGNOSIS — E119 Type 2 diabetes mellitus without complications: Secondary | ICD-10-CM | POA: Diagnosis not present

## 2021-10-12 DIAGNOSIS — M791 Myalgia, unspecified site: Secondary | ICD-10-CM

## 2021-10-12 DIAGNOSIS — H6981 Other specified disorders of Eustachian tube, right ear: Secondary | ICD-10-CM

## 2021-10-12 DIAGNOSIS — Z789 Other specified health status: Secondary | ICD-10-CM

## 2021-10-12 LAB — GLUCOSE, POCT (MANUAL RESULT ENTRY): POC Glucose: 250 mg/dl — AB (ref 70–99)

## 2021-10-12 MED ORDER — FLUTICASONE PROPIONATE 50 MCG/ACT NA SUSP: 2.0000 | Freq: Every day | NASAL | 2 refills | Status: DC

## 2021-10-12 MED ORDER — METHOCARBAMOL 500 MG PO TABS: 500.0000 mg | ORAL_TABLET | Freq: Three times a day (TID) | ORAL | 0 refills | Status: DC | PRN

## 2021-10-12 NOTE — Progress Notes (Signed)
Patient ID: Rebecca [name], female   DOB: 04/05/1966, 55 y.o.   MRN: [id number] Patient complaining with   Rebecca [name], is a 55 y.o. female  [id number]  [id number]  DOB - Apr 14, 1966  Chief Complaint  Patient presents with   Diabetes       Subjective:   Rebecca [name] is a 55 y.o. female here today for a continued R ear throbbing post antibiotic treatment(x2) for AOM.  Not painful.  She does feel like it throbs and causes her to have HA.  No fever.  Not checking blood sugars.  Sees oncology regularly.  Also having muscle aching  No problems updated.  ALLERGIES: Allergies  Allergen Reactions   Lisinopril Swelling    Swelling in throat, coughing, difficulty swallowing, could not sleep. Occurred Dec 2020.    PAST MEDICAL HISTORY: Past Medical History:  Diagnosis Date   Arthritis    Breast cancer ([place])    Diabetes mellitus    Family history of lymphoma 01/18/2021   Family history of throat cancer 01/18/2021   Hyperlipidemia    Hypertension     MEDICATIONS AT HOME: Prior to Admission medications   Medication Sig Start Date End Date Taking? Authorizing Provider  acetaminophen (TYLENOL) 500 MG tablet Take 1 tablet (500 mg total) by mouth 3 (three) times daily as needed (with one aleve tablet). 10/03/21  Yes Causey, Charlestine Massed, NP  amLODipine (NORVASC) 10 MG tablet TAKE 1 TABLET BY MOUTH EVERY DAY 07/30/21  Yes [name], MD  Ascorbic Acid (VITAMIN C) 1000 MG tablet Take 1 tablet (1,000 mg total) by mouth daily. 03/07/21  Yes Alla Feeling, NP  atorvastatin (LIPITOR) 80 MG tablet TAKE 1 TABLET BY MOUTH EVERY DAY 05/18/21  Yes Newlin, [name], MD  azelastine (ASTELIN) 0.1 % nasal spray Place 2 sprays into both nostrils 2 (two) times daily. Use in each nostril as directed 10/03/21  Yes Causey, Charlestine Massed, NP  Blood Glucose Monitoring Suppl (ONETOUCH VERIO REFLECT) w/Device KIT 1 kit by Does not apply route in the morning, at noon, and at  bedtime. Use as directed to test blood sugar up to three times daily.E11.9 02/02/20  Yes [name], MD  cetirizine (ZYRTEC) 10 MG tablet Take 1 tablet (10 mg total) by mouth daily. 11/21/19  Yes Augusto Gamble B, NP  dapagliflozin propanediol (FARXIGA) 10 MG TABS tablet Take 1 tablet (10 mg total) by mouth daily with breakfast. 06/29/21  Yes Newlin, Enobong, MD  ergocalciferol (DRISDOL) 1.25 MG (50000 UT) capsule Take 1 capsule (50,000 Units total) by mouth once a week. 11/13/20  Yes [name], MD  fluconazole (DIFLUCAN) 100 MG tablet Take 1 tablet (100 mg total) by mouth daily. 09/26/21  Yes Mayers, Cari S, PA-C  gabapentin (NEURONTIN) 100 MG capsule Take 1 capsule (100 mg total) by mouth 2 (two) times daily. Take early in the morning and at midday. 08/21/21  Yes Magrinat, Virgie Dad, MD  gabapentin (NEURONTIN) 300 MG capsule Take 1 capsule (300 mg total) by mouth at bedtime. 08/21/21  Yes Magrinat, Virgie Dad, MD  glipiZIDE (GLUCOTROL) 5 MG tablet TAKE 1 TABLET BY MOUTH DAILY 08/08/21  Yes Newlin, Enobong, MD  glucose blood (ONETOUCH VERIO) test strip Use as directed to test blood sugar up to three times daily. E11.9 02/02/20  Yes [name], MD  labetalol (NORMODYNE) 100 MG tablet Take 100 mg by mouth 2 (two) times daily.   Yes [provider]  Lancets (ONETOUCH DELICA PLUS [id number]) [place] 1 Device by  Does not apply route in the morning, at noon, and at bedtime. Use as directed to test blood sugar up to three times daily. E11.9 02/02/20  Yes [name], MD  lidocaine-prilocaine (EMLA) cream APPLY TO AFFECTED AREA AS DIRECTED 03/31/21 03/31/22 Yes Causey, Charlestine Massed, NP  metFORMIN (GLUCOPHAGE) 500 MG tablet TAKE 1 TABLET BY MOUTH TWICE DAILY 08/19/21  Yes [name], MD  methocarbamol (ROBAXIN) 500 MG tablet Take 1 tablet (500 mg total) by mouth every 8 (eight) hours as needed for muscle spasms. 10/12/21  Yes Freeman Caldron M, PA-C  naproxen sodium (ALEVE) 220 MG tablet  Take 1 tablet (220 mg total) by mouth 3 (three) times daily as needed (with one extra strength tylenol 538m). 10/03/21  Yes Causey, LCharlestine Massed NP  Omega-3 1000 MG CAPS Take 1 capsule (1,000 mg total) by mouth daily. 03/07/21  Yes BAlla Feeling NP  omeprazole (PRILOSEC) 20 MG capsule Take 1 capsule (20 mg total) by mouth daily. 11/21/19  Yes BZigmund Gottron NP  vitamin B-12 (CYANOCOBALAMIN) 100 MCG tablet Take 1 tablet (100 mcg total) by mouth daily. 03/07/21  Yes BAlla Feeling NP  fluticasone (FLONASE) 50 MCG/ACT nasal spray Place 2 sprays into both nostrils daily. 10/12/21   MArgentina Donovan PA-C    ROS: Neg HEENT Neg resp Neg cardiac Neg GI Neg GU Neg MS Neg psych Neg neuro  Objective:   Vitals:   10/12/21 0918  BP: 130/83  Pulse: (!) 104  SpO2: 100%  Weight: 149 lb (67.6 kg)  Height: 5' 3.5" (1.613 m)   Exam General appearance : Awake, alert, not in any distress. Speech Clear. Not toxic looking HEENT: Atraumatic and Normocephalic.  L TM WNL.  R TM with dull light reflex and slightly bulging Neck: Supple, no JVD. No cervical lymphadenopathy.  Chest: Good air entry bilaterally, CTAB.  No rales/rhonchi/wheezing CVS: S1 S2 regular, no murmurs.  Extremities: B/L Lower Ext shows no edema, both legs are warm to touch Neurology: Awake alert, and oriented X 3, CN II-XII intact, Non focal Skin: No Rash  Data Review Lab Results  Component Value Date   HGBA1C 7.5 (A) 06/29/2021   HGBA1C 7.1 (H) 11/11/2020   HGBA1C 7.6 (H) 06/02/2020    Assessment & Plan   1. Type 2 diabetes mellitus without complication, without long-term current use of insulin (HCC) Will titrate meds if needed.  Check blood sugars bid and record - Glucose (CBG) - Hemoglobin A1c  2. Dysfunction of right eustachian tube - fluticasone (FLONASE) 50 MCG/ACT nasal spray; Place 2 sprays into both nostrils daily.  Dispense: 15.8 mL; Refill: 2 - Ambulatory referral to ENT  3. Muscle pain -  methocarbamol (ROBAXIN) 500 MG tablet; Take 1 tablet (500 mg total) by mouth every 8 (eight) hours as needed for muscle spasms.  Dispense: 90 tablet; Refill: 0  4. Language barrier AMN- interpreters ([place])used and additional time performing visit was required.     Patient have been counseled extensively about nutrition and exercise. Other issues discussed during this visit include: low cholesterol diet, weight control and daily exercise, foot care, annual eye examinations at Ophthalmology, importance of adherence with medications and regular follow-up. We also discussed long term complications of uncontrolled diabetes and hypertension.   Return for 6-8 weeks with PCP for chronic conditions.  The patient was given clear instructions to go to ER or return to medical center if symptoms don't improve, worsen or new problems develop. The patient verbalized understanding. The patient  was told to call to get lab results if they haven't heard anything in the next week.      Freeman Caldron, PA-C [facility] [place], [place]   10/12/2021, 9:41 AM R ear pain

## 2021-10-12 NOTE — Progress Notes (Signed)
Pt complain of pain in right ear for over month and its causing headache.

## 2021-10-18 ENCOUNTER — Ambulatory Visit (HOSPITAL_COMMUNITY)
Admission: RE | Admit: 2021-10-18 | Discharge: 2021-10-18 | Disposition: A | Discharge status: Home / Self Care | Payer: 59 | Source: Ambulatory Visit | Attending: Adult Health | Admitting: Adult Health

## 2021-10-18 ENCOUNTER — Telehealth: Payer: Self-pay

## 2021-10-18 ENCOUNTER — Other Ambulatory Visit: Payer: Self-pay

## 2021-10-18 DIAGNOSIS — C50311 Malignant neoplasm of lower-inner quadrant of right female breast: Secondary | ICD-10-CM | POA: Diagnosis present

## 2021-10-18 DIAGNOSIS — Z170 Estrogen receptor positive status [ER+]: Secondary | ICD-10-CM | POA: Diagnosis present

## 2021-10-18 IMAGING — MR MR HEAD WO/W CM
13 series · 48 of 48 positions shown · IV contrast (gadavist)
Comparison: None.

CLINICAL DATA: Invasive breast cancer, stage IV.  Staging.

EXAM:
MRI HEAD WITHOUT AND WITH CONTRAST
TECHNIQUE: Multiplanar, multiecho pulse sequences of the brain and surrounding
structures were obtained without and with intravenous contrast.
CONTRAST:  6mL GADAVIST GADOBUTROL 1 MMOL/ML IV SOLN

[Series 5: DWI · axial · 3.0mm · 1.36mm/px · z∈[-42,+98]mm · 6 of 96 slices shown (1 of 2)]
[im 1/96]
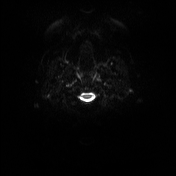
[im 20/96]
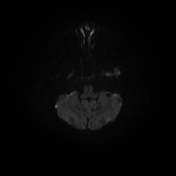
[im 39/96]
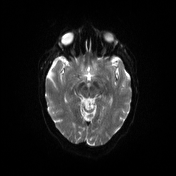
[im 58/96]
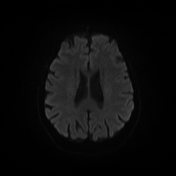
[im 77/96]
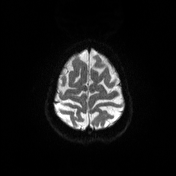
[im 96/96]
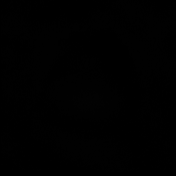

[Series 6: DWI · axial · 3.0mm · 1.36mm/px · z∈[-42,+89]mm · 3 of 45 slices shown (2 of 2)]
[im 1/45]
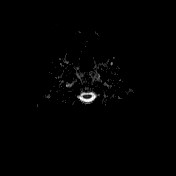
[im 23/45]
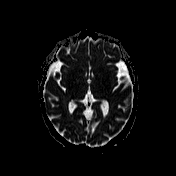
[im 45/45]
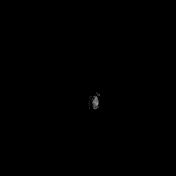

[Series 7: T1 · sagittal · 5.0mm · 0.75mm/px · 1 of 22 slices shown (1 of 2)]
[im 1/22]
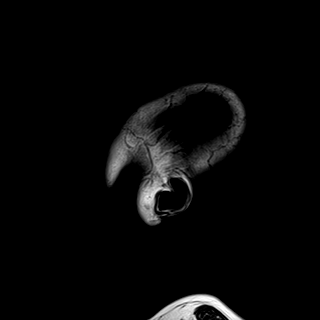

[Series 8: T2 · axial · 5.0mm · 0.62mm/px · z∈[-47,+102]mm · 2 of 24 slices shown]
[im 1/24]
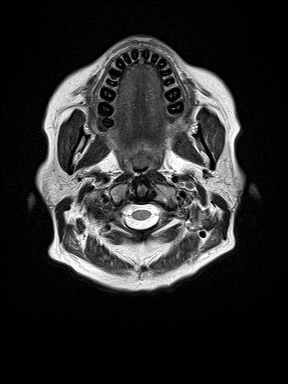
[im 24/24]
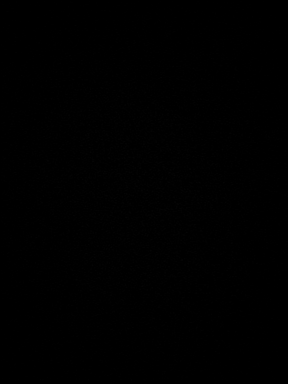

[Series 9: swi_images · axial · 3.0mm · 0.75mm/px · z∈[-49,+104]mm · 3 of 52 slices shown]
[im 1/52]
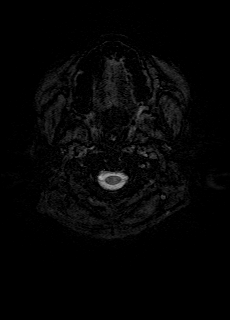
[im 26/52]
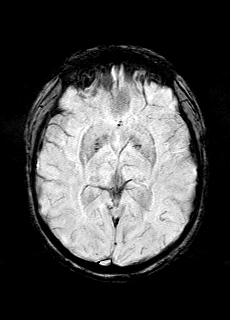
[im 52/52]
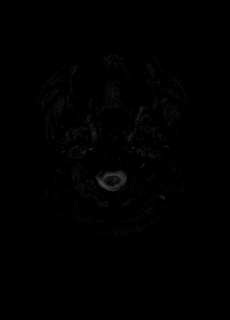

[Series 11: FLAIR · axial · 3.0mm · 0.75mm/px · z∈[-49,+104]mm · 3 of 52 slices shown]
[im 1/52]
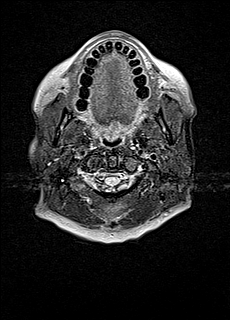
[im 26/52]
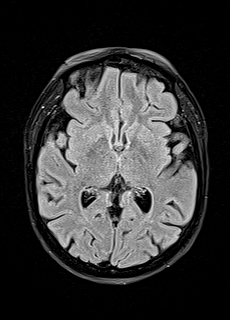
[im 52/52]
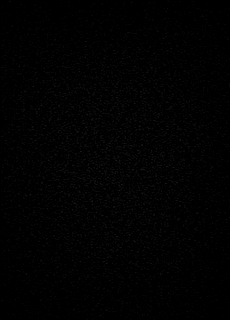

[Series 12: T1 · axial · 1.0mm · 0.94mm/px · z∈[-54,+105]mm · 10 of 160 slices shown (2 of 2)]
[im 1/160]
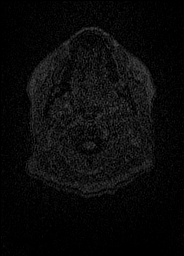
[im 18/160]
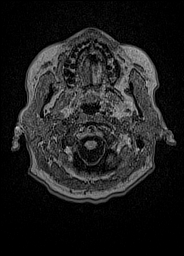
[im 36/160]
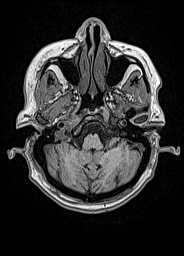
[im 54/160]
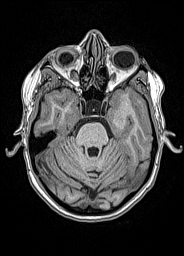
[im 71/160]
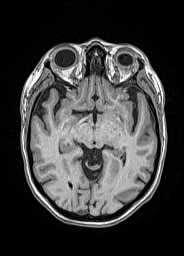
[im 89/160]
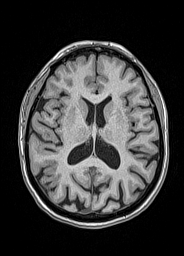
[im 107/160]
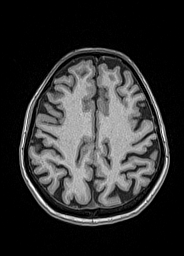
[im 124/160]
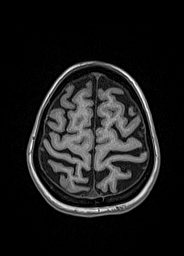
[im 142/160]
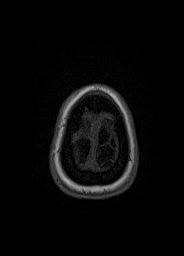
[im 160/160]
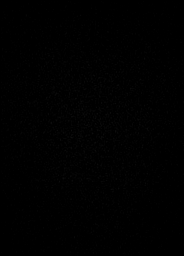

[Series 13: cor dwi_tracew · coronal · 5.0mm · 1.53mm/px · 3 of 54 slices shown]
[im 1/54]
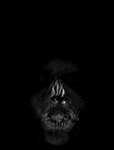
[im 27/54]
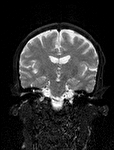
[im 54/54]
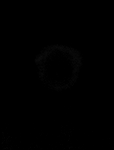

[Series 14: cor dwi_adc · coronal · 5.0mm · 1.53mm/px · 2 of 26 slices shown]
[im 1/26]
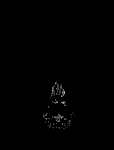
[im 26/26]
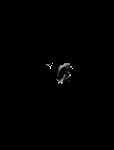

[Series 15: T2 post-contrast · coronal · 5.0mm · 0.57mm/px · 2 of 27 slices shown]
[im 1/27]
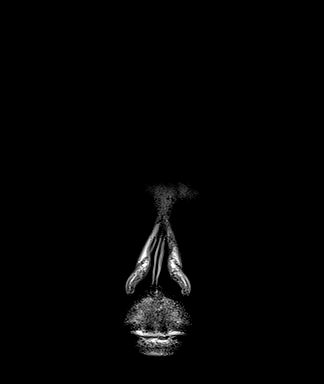
[im 27/27]
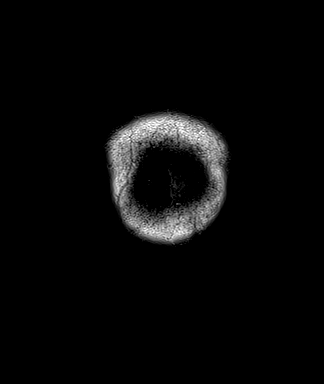

[Series 16: T1 post-contrast · axial · 1.0mm · 0.94mm/px · z∈[-54,+105]mm · 10 of 160 slices shown (1 of 3)]
[im 1/160]
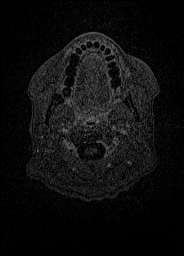
[im 18/160]
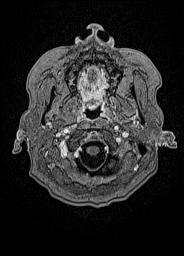
[im 36/160]
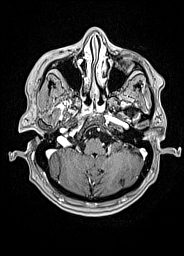
[im 54/160]
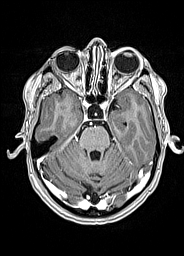
[im 71/160]
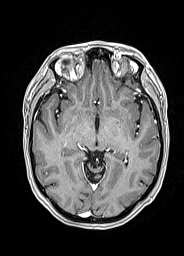
[im 89/160]
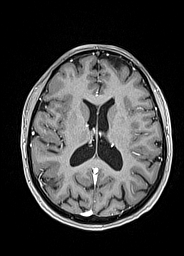
[im 107/160]
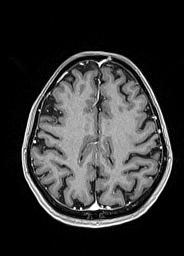
[im 124/160]
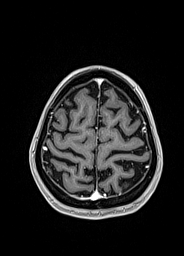
[im 142/160]
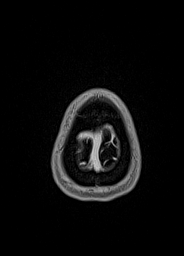
[im 160/160]
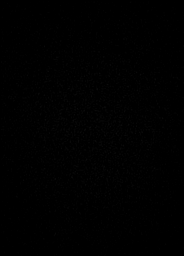

[Series 17: T1 post-contrast · coronal · 5.0mm · 0.43mm/px · 2 of 27 slices shown (2 of 3)]
[im 1/27]
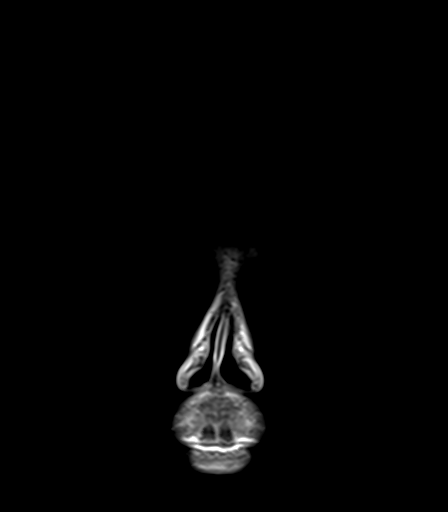
[im 27/27]
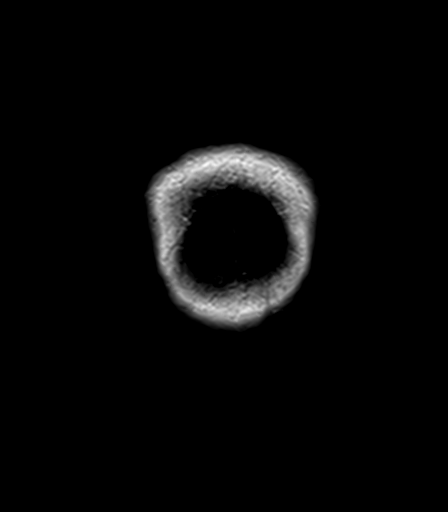

[Series 18: T1 post-contrast · sagittal · 5.0mm · 0.75mm/px · 1 of 22 slices shown (3 of 3)]
[im 1/22]
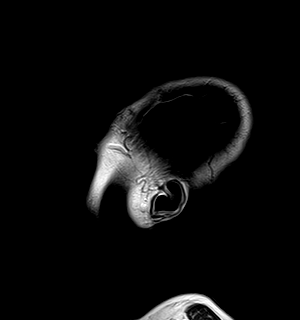

[48 of 48 positions shown; findings below may reference images not displayed]

FINDINGS: Brain: No abnormal enhancement or swelling to suggest metastatic
disease.

Small chronic infarct at the high right postcentral gyrus.

Small remote left cerebellar infarction.

No hemorrhage, hydrocephalus, or collection.

Vascular: Normal flow voids and vascular enhancements.

Skull and upper cervical spine: Normal marrow signal.

Sinuses/Orbits: Negative
IMPRESSION: No evidence of metastatic disease.

Small remote infarcts in the left cerebellum and right parietal
cortex.

## 2021-10-18 MED ORDER — GADOBUTROL 1 MMOL/ML IV SOLN
6.0000 mL | Freq: Once | INTRAVENOUS | Status: AC | PRN
  Administered 2021-10-18: 08:00:00 6 mL via INTRAVENOUS

## 2021-10-18 NOTE — Telephone Encounter (Signed)
-----   Message from Gardenia Phlegm, NP sent at 10/18/2021  1:39 PM EST ----- Please call patient that MRI is negative for cancer in the brain ----- Message ----- From: Interface, Rad Results In Sent: 10/18/2021  11:29 AM EST To: Gardenia Phlegm, NP

## 2021-10-18 NOTE — Telephone Encounter (Signed)
[place] interpreters and spoke with [phone] who translated results to patient. Patient verbalized understanding and thanks in [place]. Knows to call with any concerns

## 2021-10-19 ENCOUNTER — Encounter: Payer: Self-pay | Admitting: Oncology

## 2021-10-24 ENCOUNTER — Other Ambulatory Visit: Payer: 59

## 2021-10-24 ENCOUNTER — Ambulatory Visit: Payer: 59

## 2021-10-24 ENCOUNTER — Ambulatory Visit: Payer: 59 | Admitting: Hematology and Oncology

## 2021-10-25 ENCOUNTER — Encounter: Payer: Self-pay | Admitting: Oncology

## 2021-10-25 NOTE — Progress Notes (Signed)
° °                                                                                                                                                          °  Patient Name: Rebecca [name] MRN: [id number] DOB: 1966/06/18 Referring Physician: Arnoldo Morale (Profile Not Attached) Date of Service: 09/29/2021 [facility], [place]                                                        End Of Treatment Note  Diagnoses: C50.311-Malignant neoplasm of lower-inner quadrant of right female breast  Cancer Staging:  Cancer Staging  Malignant neoplasm of lower-inner quadrant of right breast of female, estrogen receptor positive ([place]) Staging form: Breast, AJCC 8th Edition - Clinical: Stage IIIB (cT2, cN1, cM0, G3, ER-, PR-, HER2-) - Signed by Chauncey Cruel, MD on 01/18/2021 Stage prefix: Initial diagnosis Histologic grading system: 3 grade system   Intent: Curative  Radiation Treatment Dates: 08/16/2021 through 09/29/2021 Site Technique Total Dose (Gy) Dose per Fx (Gy) Completed Fx Beam Energies  Breast, Right: Breast_Rt_IMN 3D 50/50 2 25/25 10X  Breast, Right: Breast_Rt_PAB_SCV 3D 50/50 2 25/25 10X, 15X  Breast, Right: Breast_Rt_Bst 3D 10/10 2 5/5 6X, 10X   Narrative: The patient tolerated radiation therapy relatively well.   Plan: The patient will follow-up with radiation oncology in 72mo. -----------------------------------  SEppie Gibson MD

## 2021-10-27 ENCOUNTER — Encounter: Payer: Self-pay | Admitting: Oncology

## 2021-10-30 ENCOUNTER — Telehealth: Payer: Self-pay | Admitting: *Deleted

## 2021-10-30 ENCOUNTER — Ambulatory Visit: Payer: Self-pay

## 2021-10-30 ENCOUNTER — Inpatient Hospital Stay: Payer: 59 | Attending: Oncology

## 2021-10-30 ENCOUNTER — Inpatient Hospital Stay: Payer: 59 | Admitting: Hematology and Oncology

## 2021-10-30 DIAGNOSIS — Z5112 Encounter for antineoplastic immunotherapy: Secondary | ICD-10-CM | POA: Insufficient documentation

## 2021-10-30 DIAGNOSIS — Z79899 Other long term (current) drug therapy: Secondary | ICD-10-CM | POA: Insufficient documentation

## 2021-10-30 DIAGNOSIS — I890 Lymphedema, not elsewhere classified: Secondary | ICD-10-CM | POA: Insufficient documentation

## 2021-10-30 DIAGNOSIS — C773 Secondary and unspecified malignant neoplasm of axilla and upper limb lymph nodes: Secondary | ICD-10-CM | POA: Insufficient documentation

## 2021-10-30 DIAGNOSIS — Z171 Estrogen receptor negative status [ER-]: Secondary | ICD-10-CM | POA: Insufficient documentation

## 2021-10-30 DIAGNOSIS — H6591 Unspecified nonsuppurative otitis media, right ear: Secondary | ICD-10-CM | POA: Insufficient documentation

## 2021-10-30 DIAGNOSIS — C50311 Malignant neoplasm of lower-inner quadrant of right female breast: Secondary | ICD-10-CM | POA: Insufficient documentation

## 2021-10-30 NOTE — Telephone Encounter (Signed)
This RN attempted to contact pt per appt today with MD and for infusion- obtained number identified VM- message left per above.  Per chart review- noted pt is scheduled for rad onc follow up 11/01/2021.  This RN will inquire with infusion if availability for [facility] 1/11 - and appt with Dr Chryl Heck to be rescheduled appropriately.  This note will be forwarded to Dr Chryl Heck for follow up.

## 2021-10-30 NOTE — Progress Notes (Deleted)
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  She is accompanied by an interpreter Sarah.    She continues on pembrolizumab. She denies any new concerns related to the medication.    We are following her thyroid studies: Lab Results  Component Value Date   TSH 0.896 10/03/2021   TSH 0.701 09/12/2021   TSH 1.404 08/22/2021   TSH 1.176 08/08/2021   TSH 0.930 08/01/2021    REVIEW OF SYSTEMS: Review of Systems  Constitutional:  Positive for fatigue. Negative for appetite change, chills, fever and unexpected weight change.  HENT:   Negative for hearing loss, lump/mass, mouth sores, sore throat, tinnitus, trouble swallowing and voice change.   Eyes:  Negative for eye problems and icterus.  Respiratory:  Negative for chest tightness, cough and shortness of breath.   Cardiovascular:  Negative for chest pain, leg swelling and palpitations.  Gastrointestinal:  Negative for abdominal distention, abdominal pain, constipation, diarrhea, nausea and vomiting.  Endocrine: Negative for hot flashes.  Genitourinary:  Negative for difficulty urinating.   Musculoskeletal:  Positive for arthralgias.  Skin:  Negative for itching and rash.  Neurological:  Negative for dizziness, extremity weakness,  headaches and numbness.  Hematological:  Negative for adenopathy. Does not bruise/bleed easily.  Psychiatric/Behavioral:  Negative for depression. The patient is not nervous/anxious.     COVID 19 VACCINATION STATUS: fully vaccinated [employer]), with booster 12/2020   HISTORY OF CURRENT ILLNESS: From the original intake note:  Emaley A Blouch herself palpated a mass in the right axilla. She underwent bilateral diagnostic mammography with tomography and right breast ultrasonography at [facility] on 01/03/2021 showing: breast density category B; two adjacent suspicious right breast masses at 5 o'clock; markedly abnormal right axillary lymph node; no evidence of malignancy on left.  Accordingly on 01/06/2021 she proceeded to biopsy of the right breast area in question. The pathology from this procedure ([id number]) showed: invasive ductal carcinoma, grade 3. Prognostic indicators significant for: estrogen receptor, 10% positive with weak staining intensity and progesterone receptor, 0% negative. Proliferation marker Ki67 at 90%. HER2 equivocal by immunohistochemistry (2+), but negative by fluorescent in situ hybridization with a signals ratio 1.38 and number per cell 3.63.   Cancer Staging  Malignant neoplasm of lower-inner quadrant of right breast of female, estrogen receptor positive ([place]) Staging form: Breast, AJCC 8th Edition - Clinical: Stage IIIB (cT2, cN1, cM0, G3, ER-, PR-, HER2-) - Signed by Chauncey Cruel, MD on 01/18/2021 Stage prefix: Initial diagnosis Histologic grading system: 3 grade system  [name] is as detailed below.   PAST MEDICAL HISTORY: Past Medical History:  Diagnosis Date   Arthritis    Breast cancer ([place])    Diabetes mellitus    Family history of lymphoma 01/18/2021   Family history of throat cancer 01/18/2021   Hyperlipidemia    Hypertension     PAST SURGICAL HISTORY: Past  Surgical History:  Procedure Laterality Date    BREAST LUMPECTOMY WITH RADIOACTIVE SEED AND SENTINEL LYMPH NODE BIOPSY Right 07/11/2021   Procedure: RIGHT BREAST LUMPECTOMY WITH RADIOACTIVE SEED AND RIGHT SENTINEL LYMPH NODE BIOPSY;  Surgeon: Erroll Luna, MD;  Location: [facility];  Service: General;  Laterality: Right;   CESAREAN SECTION     PORTACATH PLACEMENT N/A 01/26/2021   Procedure: INSERTION PORT-A-CATH;  Surgeon: Erroll Luna, MD;  Location: [facility];  Service: General;  Laterality: N/A;  [place] Right 07/11/2021   Procedure: RADIOACTIVE SEED GUIDED RIGHT AXILLARY LYMPH NODE BIOPSY;  Surgeon: Erroll Luna, MD;  Location: [facility];  Service: General;  Laterality: Right;    FAMILY HISTORY: Family History  Problem Relation Age of Onset   Diabetes Mother    Heart disease Mother    Diabetes Father    Throat cancer Father        d. 49s   Diabetes Sister    Diabetes Brother    Lymphoma Cousin        maternal cousin; d. 7s   Her father died in his 84's and her mother in her 87'G, both from complications of diabetes. Rebecca [name] has 6 brothers and 1 sister. There is no family history of breast or ovarian cancer to her knowledge.   GYNECOLOGIC HISTORY:  No LMP recorded. Patient is perimenopausal. Menarche: 56 years old [place] P 0 LMP 10/31/2020 (before that, 06/2020) Contraceptive used from 2017-2018 HRT n/a  Hysterectomy? no BSO? no   SOCIAL HISTORY: (updated 12/2020)  Kionna is originally from [place]. 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 She is divorced. She lives at home with her nephew [name], who is 4 and works for [employer]. She attends [employer] ([employer]).    ADVANCED DIRECTIVES: not in place; at the 01/18/2021 visit she stated her ex-husband [name], who is disabled due to past injuries, ([phone]) would be the person to contact if she became  incapacitated [the patient states aside from her nephew she has no other relatives in [place]: Social History   Tobacco Use   Smoking status: Never    Passive exposure: Yes   Smokeless tobacco: Never  Vaping Use   Vaping Use: Never used  Substance Use Topics   Alcohol use: No   Drug use: No     Colonoscopy: never done, referral in place  PAP: 12/2020, negative  Bone density: never done   Allergies  Allergen Reactions   Lisinopril Swelling    Swelling in throat, coughing, difficulty swallowing, could not sleep. Occurred Dec 2020.    Current Outpatient Medications  Medication Sig Dispense Refill   acetaminophen (TYLENOL) 500 MG tablet Take 1 tablet (500 mg total) by mouth 3 (three) times daily as needed (with one aleve tablet). 30 tablet 0   amLODipine (NORVASC) 10 MG tablet TAKE 1 TABLET BY MOUTH EVERY DAY 90 tablet 0   Ascorbic Acid (VITAMIN C) 1000 MG tablet Take 1 tablet (1,000 mg total) by mouth daily. 30 tablet 0   atorvastatin (LIPITOR) 80 MG tablet TAKE 1 TABLET BY MOUTH EVERY DAY 90 tablet 1   azelastine (ASTELIN) 0.1 % nasal spray Place 2 sprays into both nostrils 2 (two) times daily. Use in each nostril as directed 30 mL 12   Blood Glucose Monitoring Suppl (ONETOUCH VERIO REFLECT) w/Device KIT 1 kit by  Does not apply route in the morning, at noon, and at bedtime. Use as directed to test blood sugar up to three times daily.E11.9 1 kit 0   cetirizine (ZYRTEC) 10 MG tablet Take 1 tablet (10 mg total) by mouth daily. 30 tablet 0   dapagliflozin propanediol (FARXIGA) 10 MG TABS tablet Take 1 tablet (10 mg total) by mouth daily with breakfast. 90 tablet 1   ergocalciferol (DRISDOL) 1.25 MG (50000 UT) capsule Take 1 capsule (50,000 Units total) by mouth once a week. 12 capsule 0   fluconazole (DIFLUCAN) 100 MG tablet Take 1 tablet (100 mg total) by mouth daily. 10 tablet 0   fluticasone (FLONASE) 50 MCG/ACT nasal spray Place 2 sprays into both nostrils  daily. 15.8 mL 2   gabapentin (NEURONTIN) 100 MG capsule Take 1 capsule (100 mg total) by mouth 2 (two) times daily. Take early in the morning and at midday. 60 capsule 6   gabapentin (NEURONTIN) 300 MG capsule Take 1 capsule (300 mg total) by mouth at bedtime. 90 capsule 4   glipiZIDE (GLUCOTROL) 5 MG tablet TAKE 1 TABLET BY MOUTH DAILY 90 tablet 1   glucose blood (ONETOUCH VERIO) test strip Use as directed to test blood sugar up to three times daily. E11.9 100 each 6   labetalol (NORMODYNE) 100 MG tablet Take 100 mg by mouth 2 (two) times daily.     Lancets (ONETOUCH DELICA PLUS ZYYQMG50I) MISC 1 Device by Does not apply route in the morning, at noon, and at bedtime. Use as directed to test blood sugar up to three times daily. E11.9 100 each 6   lidocaine-prilocaine (EMLA) cream APPLY TO AFFECTED AREA AS DIRECTED 30 g 3   metFORMIN (GLUCOPHAGE) 500 MG tablet TAKE 1 TABLET BY MOUTH TWICE DAILY 180 tablet 1   methocarbamol (ROBAXIN) 500 MG tablet Take 1 tablet (500 mg total) by mouth every 8 (eight) hours as needed for muscle spasms. 90 tablet 0   naproxen sodium (ALEVE) 220 MG tablet Take 1 tablet (220 mg total) by mouth 3 (three) times daily as needed (with one extra strength tylenol $RemoveBeforeDE'500mg'pRhBoOzMzHaCHBi$ ).     Omega-3 1000 MG CAPS Take 1 capsule (1,000 mg total) by mouth daily. 30 capsule 0   omeprazole (PRILOSEC) 20 MG capsule Take 1 capsule (20 mg total) by mouth daily. 30 capsule 0   vitamin B-12 (CYANOCOBALAMIN) 100 MCG tablet Take 1 tablet (100 mcg total) by mouth daily. 30 tablet 0   No current facility-administered medications for this visit.    OBJECTIVE: Rebecca [name] woman who appears stated age  There were no vitals filed for this visit.     There is no height or weight on file to calculate BMI.   Wt Readings from Last 3 Encounters:  10/12/21 149 lb (67.6 kg)  10/03/21 146 lb 4.8 oz (66.4 kg)  09/26/21 165 lb (74.8 kg)      ECOG FS:1 - Symptomatic but completely ambulatory  GENERAL: Patient  is a well appearing female in no acute distress HEENT:  Sclerae anicteric.  Oropharynx clear and moist. No ulcerations or evidence of oropharyngeal candidiasis. Neck is supple. TM bilaterally visible, + fluid, no erythema, BLM visible NODES:  No cervical, supraclavicular, or axillary lymphadenopathy palpated.  BREAST EXAM:  Deferred. LUNGS:  Clear to auscultation bilaterally.  No wheezes or rhonchi. HEART:  Regular rate and rhythm. No murmur appreciated. ABDOMEN:  Soft, nontender.  Positive, normoactive bowel sounds. No organomegaly palpated. MSK:  No focal spinal tenderness to palpation.  Full range of motion bilaterally in the upper extremities. EXTREMITIES:  No peripheral edema.   SKIN:  Clear with no obvious rashes or skin changes. No nail dyscrasia. NEURO:  Nonfocal. Well oriented.  Appropriate affect.  LAB RESULTS:  CMP     Component Value Date/Time   NA 139 10/03/2021 0732   NA 140 12/27/2020 1123   K 3.8 10/03/2021 0732   CL 102 10/03/2021 0732   CO2 24 10/03/2021 0732   GLUCOSE 172 (H) 10/03/2021 0732   BUN 14 10/03/2021 0732   BUN 11 12/27/2020 1123   CREATININE 0.63 10/03/2021 0732   CREATININE 0.50 11/02/2016 1102   CALCIUM 9.5 10/03/2021 0732   PROT 7.5 10/03/2021 0732   PROT 7.5 12/27/2020 1123   ALBUMIN 4.0 10/03/2021 0732   ALBUMIN 4.7 12/27/2020 1123   AST 31 10/03/2021 0732   ALT 37 10/03/2021 0732   ALKPHOS 82 10/03/2021 0732   BILITOT 0.4 10/03/2021 0732   GFRNONAA >60 10/03/2021 0732   GFRNONAA >89 01/16/2016 1006   GFRAA 118 06/02/2020 0942   GFRAA >89 01/16/2016 1006    No results found for: TOTALPROTELP, ALBUMINELP, A1GS, A2GS, BETS, BETA2SER, GAMS, MSPIKE, SPEI  Lab Results  Component Value Date   WBC 3.0 (L) 10/03/2021   NEUTROABS 2.0 10/03/2021   HGB 13.2 10/03/2021   HCT 40.1 10/03/2021   MCV 79.4 (L) 10/03/2021   PLT 213 10/03/2021    No results found for: LABCA2  No components found for: YSAYTK160  No results for input(s): INR in  the last 168 hours.  No results found for: LABCA2  No results found for: FUX323  No results found for: FTD322  No results found for: GUR427  No results found for: CA2729  No components found for: HGQUANT  No results found for: CEA1 / No results found for: CEA1   No results found for: AFPTUMOR  No results found for: CHROMOGRNA  No results found for: KPAFRELGTCHN, LAMBDASER, KAPLAMBRATIO (kappa/lambda light chains)  No results found for: HGBA, HGBA2QUANT, HGBFQUANT, HGBSQUAN (Hemoglobinopathy evaluation)   No results found for: LDH  No results found for: IRON, TIBC, IRONPCTSAT (Iron and TIBC)  No results found for: FERRITIN  Urinalysis    Component Value Date/Time   COLORURINE YELLOW 06/24/2007 0116   APPEARANCEUR CLOUDY (A) 06/24/2007 0116   LABSPEC 1.025 09/18/2020 1125   PHURINE 5.5 09/18/2020 1125   GLUCOSEU 100 (A) 09/18/2020 1125   HGBUR TRACE (A) 09/18/2020 1125   BILIRUBINUR NEGATIVE 09/18/2020 1125   KETONESUR NEGATIVE 09/18/2020 1125   PROTEINUR NEGATIVE 09/18/2020 1125   UROBILINOGEN 0.2 09/18/2020 1125   NITRITE NEGATIVE 09/18/2020 1125   LEUKOCYTESUR NEGATIVE 09/18/2020 1125    STUDIES: MR Brain W Wo Contrast  Result Date: 10/18/2021 CLINICAL DATA:  Invasive breast cancer, stage IV.  Staging. EXAM: MRI HEAD WITHOUT AND WITH CONTRAST TECHNIQUE: Multiplanar, multiecho pulse sequences of the brain and surrounding structures were obtained without and with intravenous contrast. CONTRAST:  72m GADAVIST GADOBUTROL 1 MMOL/ML IV SOLN COMPARISON:  None. FINDINGS: Brain: No abnormal enhancement or swelling to suggest metastatic disease. 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 Small remote left cerebellar infarction. No hemorrhage, hydrocephalus, or collection. Vascular: Normal flow voids and vascular enhancements. Skull and upper cervical spine: Normal marrow signal. Sinuses/Orbits: Negative IMPRESSION: No evidence of metastatic disease. 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 Electronically Signed   By: JJorje GuildM.D.   On: 10/18/2021 11:26  ELIGIBLE FOR AVAILABLE RESEARCH PROTOCOL: no  ASSESSMENT: 56 y.o. [place] woman status post right breast lower inner quadrant biopsy 01/06/2021 for a clinically T2 N1, stage IIIB functionally triple negative invasive ductal carcinoma, grade 3, with and MIB-1 of 90%  (a) chest CT scan 01/29/2021 shows no evidence of metastatic disease  (b) bone scan 02/01/2021 shows no evidence of metastatic disease  (1) neoadjuvant chemotherapy consisting of pembrolizumab given day 1 together with carboplatin and paclitaxel, with Taxol given on day 8 and 15, and filgrastim on days 16,17 and 18, started 02/07/2021, repeated every 21 days x 2, followed by doxorubicin and cyclophosphamide every 21 days x 4 also given with pembrolizumab on day 1 and PEG filgrastim day 3, started 03/22/2021 and completed 05/23/2021 (a) carboplatin/paclitaxel discontinued after the cycle 2-day 8 dose with neuropathy developing (subsequently resolved)   (2) pembrolizumab continuing to total 1 year  (3) right lumpectomy and sentinel lymph node sampling 07/11/2021 showed a complete pathologic response (ypT0 ypN0)  (a) a total of 3 right axillary lymph nodes were removed  (4) adjuvant radiation to be completed 09/27/2021  (5) genetics testing 01/31/2021 through the [facility] Panel found no deleterious mutations in AIP, ALK, APC, ATM, AXIN2, BAP1, BARD1, BLM, BMPR1A, BRCA1, BRCA2, BRIP1, CDC73, CDH1, CDK4, CDKN1B, CDKN2A, CHEK2, CTNNA1, DICER1, FANCC, FH, FLCN, GALNT12, KIF1B, LZTR1, MAX, MEN1, MET, MLH1, MSH2, MSH3, MSH6, MUTYH, NBN, NF1, NF2, NTHL1, PALB2, PHOX2B, PMS2, POT1, PRKAR1A, PTCH1, PTEN, RAD51C, RAD51D, RB1, RECQL, RET, SDHA, SDHAF2, SDHB, SDHC, SDHD, SMAD4, SMARCA4, SMARCB1, SMARCE1, STK11, SUFU, TMEM127, TP53, TSC1, TSC2, VHL and XRCC2 (sequencing and  deletion/duplication); EGFR, EGLN1, HOXB13, KIT, MITF, PDGFRA, POLD1, and POLE (sequencing only); EPCAM and GREM1 (deletion/duplication only).  MRI brain 10/18/2021, no evidence of intracranial metastatic disease.  PLAN:   Total encounter time 30 minutes.* in face to face visit time, chart review, lab review, order entry, care coordination, and documentation of the encounter.    *Total Encounter Time as defined by the Centers for Medicare and Medicaid Services includes, in addition to the face-to-face time of a patient visit (documented in the note above) non-face-to-face time: obtaining and reviewing outside history, ordering and reviewing medications, tests or procedures, care coordination (communications with other health care professionals or caregivers) and documentation in the medical record.

## 2021-11-01 ENCOUNTER — Encounter: Payer: Self-pay | Admitting: Adult Health

## 2021-11-01 ENCOUNTER — Inpatient Hospital Stay: Payer: 59

## 2021-11-01 ENCOUNTER — Ambulatory Visit
Admission: RE | Admit: 2021-11-01 | Discharge: 2021-11-01 | Disposition: A | Discharge status: Home / Self Care | Payer: 59 | Source: Ambulatory Visit | Attending: Radiation Oncology | Admitting: Radiation Oncology

## 2021-11-01 ENCOUNTER — Encounter: Payer: Self-pay | Admitting: Radiation Oncology

## 2021-11-01 ENCOUNTER — Other Ambulatory Visit: Payer: Self-pay

## 2021-11-01 ENCOUNTER — Inpatient Hospital Stay (HOSPITAL_BASED_OUTPATIENT_CLINIC_OR_DEPARTMENT_OTHER): Payer: 59 | Admitting: Adult Health

## 2021-11-01 VITALS — BP 134/92 | HR 92 | Temp 98.2°F | Resp 18 | Wt 145.0 lb

## 2021-11-01 DIAGNOSIS — M79601 Pain in right arm: Secondary | ICD-10-CM | POA: Diagnosis not present

## 2021-11-01 DIAGNOSIS — Z95828 Presence of other vascular implants and grafts: Secondary | ICD-10-CM

## 2021-11-01 DIAGNOSIS — Z79899 Other long term (current) drug therapy: Secondary | ICD-10-CM | POA: Insufficient documentation

## 2021-11-01 DIAGNOSIS — C50311 Malignant neoplasm of lower-inner quadrant of right female breast: Secondary | ICD-10-CM | POA: Insufficient documentation

## 2021-11-01 DIAGNOSIS — C773 Secondary and unspecified malignant neoplasm of axilla and upper limb lymph nodes: Secondary | ICD-10-CM | POA: Insufficient documentation

## 2021-11-01 DIAGNOSIS — Z170 Estrogen receptor positive status [ER+]: Secondary | ICD-10-CM

## 2021-11-01 DIAGNOSIS — M25511 Pain in right shoulder: Secondary | ICD-10-CM | POA: Insufficient documentation

## 2021-11-01 DIAGNOSIS — Z7984 Long term (current) use of oral hypoglycemic drugs: Secondary | ICD-10-CM | POA: Insufficient documentation

## 2021-11-01 DIAGNOSIS — Z923 Personal history of irradiation: Secondary | ICD-10-CM | POA: Diagnosis not present

## 2021-11-01 LAB — CMP (CANCER CENTER ONLY)
ALT: 31 U/L (ref 0–44)
AST: 30 U/L (ref 15–41)
Albumin: 4.2 g/dL (ref 3.5–5.0)
Alkaline Phosphatase: 78 U/L (ref 38–126)
Anion gap: 11 (ref 5–15)
BUN: 14 mg/dL (ref 6–20)
CO2: 26 mmol/L (ref 22–32)
Calcium: 9.5 mg/dL (ref 8.9–10.3)
Chloride: 103 mmol/L (ref 98–111)
Creatinine: 0.52 mg/dL (ref 0.44–1.00)
GFR, Estimated: >60 mL/min (ref >60)
Glucose, Bld: 159 mg/dL — ABNORMAL HIGH (ref 70–99)
Potassium: 3.8 mmol/L (ref 3.5–5.1)
Sodium: 140 mmol/L (ref 135–145)
Total Bilirubin: 0.5 mg/dL (ref 0.3–1.2)
Total Protein: 7.5 g/dL (ref 6.5–8.1)

## 2021-11-01 LAB — CBC WITH DIFFERENTIAL (CANCER CENTER ONLY)
Abs Immature Granulocytes: 0.01 10*3/uL (ref 0.00–0.07)
Basophils Absolute: 0 10*3/uL (ref 0.0–0.1)
Basophils Relative: 1 %
Eosinophils Absolute: 0.1 10*3/uL (ref 0.0–0.5)
Eosinophils Relative: 3 %
HCT: 40.7 % (ref 36.0–46.0)
Hemoglobin: 13.1 g/dL (ref 12.0–15.0)
Immature Granulocytes: 0 %
Lymphocytes Relative: 26 %
Lymphs Abs: 1 10*3/uL (ref 0.7–4.0)
MCH: 25.7 pg — ABNORMAL LOW (ref 26.0–34.0)
MCHC: 32.2 g/dL (ref 30.0–36.0)
MCV: 79.8 fL — ABNORMAL LOW (ref 80.0–100.0)
Monocytes Absolute: 0.4 10*3/uL (ref 0.1–1.0)
Monocytes Relative: 11 %
Neutro Abs: 2.4 10*3/uL (ref 1.7–7.7)
Neutrophils Relative %: 59 %
Platelet Count: 227 10*3/uL (ref 150–400)
RBC: 5.1 MIL/uL (ref 3.87–5.11)
RDW: 14.4 % (ref 11.5–15.5)
WBC Count: 3.9 10*3/uL — ABNORMAL LOW (ref 4.0–10.5)
nRBC: 0 % (ref 0.0–0.2)

## 2021-11-01 LAB — TSH: TSH: 1.083 u[IU]/mL (ref 0.308–3.960)

## 2021-11-01 MED ORDER — SODIUM CHLORIDE 0.9% FLUSH
10.0000 mL | INTRAVENOUS | Status: DC | PRN
  Administered 2021-11-01: 10 mL

## 2021-11-01 MED ORDER — SODIUM CHLORIDE 0.9 % IV SOLN
200.0000 mg | Freq: Once | INTRAVENOUS | Status: AC
  Administered 2021-11-01: 200 mg via INTRAVENOUS
  Filled 2021-11-01: qty 8

## 2021-11-01 MED ORDER — SODIUM CHLORIDE 0.9 % IV SOLN: Freq: Once | INTRAVENOUS | Status: AC

## 2021-11-01 MED ORDER — ALRA NON-METALLIC DEODORANT (RAD-ONC)
1.0000 "application " | Freq: Once | TOPICAL | Status: AC
  Administered 2021-11-01: 1 via TOPICAL

## 2021-11-01 MED ORDER — SODIUM CHLORIDE 0.9% FLUSH
10.0000 mL | Freq: Once | INTRAVENOUS | Status: AC
  Administered 2021-11-01: 10 mL

## 2021-11-01 MED ORDER — ANTICOAGULANT SODIUM CITRATE LOCK FLUSH 4% (120 MG/3ML) IV SOLN
5.0000 mL | PREFILLED_SYRINGE | Freq: Once | INTRAVENOUS | Status: AC
  Administered 2021-11-01: 5 mL
  Filled 2021-11-01: qty 6

## 2021-11-01 MED ORDER — RADIAPLEXRX EX GEL: Freq: Once | CUTANEOUS | Status: AC

## 2021-11-01 NOTE — Progress Notes (Signed)
Began re-enrollment process for [employer] for [place] assistance.  Left physician form on provider desk to be completed and returned to me.  Will have patient sign her portion before she leaves today then fax to [employer] for processing. Josem Kaufmann has already been obtained.

## 2021-11-01 NOTE — Progress Notes (Signed)
Met with patient and interpreter to obtain signature for [employer] copay application for [employer].  Patient had additional concerns which I sent message to social worker for follow up.  She has my card for any additional financial questions or concerns.

## 2021-11-01 NOTE — Patient Instructions (Signed)
[facility]   ?Discharge Instructions: ?Thank you for choosing [name] to provide your oncology and hematology care.  ? ?If you have a lab appointment with the Cancer Center, please go directly to the Cancer Center and check in at the registration area. ?  ?Wear comfortable clothing and clothing appropriate for easy access to any Portacath or PICC line.  ? ?We strive to give you quality time with your provider. You may need to reschedule your appointment if you arrive late (15 or more minutes).  Arriving late affects you and other patients whose appointments are after yours.  Also, if you miss three or more appointments without notifying the office, you may be dismissed from the clinic at the provider?s discretion.    ?  ?For prescription refill requests, have your pharmacy contact our office and allow 72 hours for refills to be completed.   ? ?Today you received the following chemotherapy and/or immunotherapy agents: pembrolizumab    ?  ?To help prevent nausea and vomiting after your treatment, we encourage you to take your nausea medication as directed. ? ?BELOW ARE SYMPTOMS THAT SHOULD BE REPORTED IMMEDIATELY: ?*FEVER GREATER THAN 100.4 F (38 ?C) OR HIGHER ?*CHILLS OR SWEATING ?*NAUSEA AND VOMITING THAT IS NOT CONTROLLED WITH YOUR NAUSEA MEDICATION ?*UNUSUAL SHORTNESS OF BREATH ?*UNUSUAL BRUISING OR BLEEDING ?*URINARY PROBLEMS (pain or burning when urinating, or frequent urination) ?*BOWEL PROBLEMS (unusual diarrhea, constipation, pain near the anus) ?TENDERNESS IN MOUTH AND THROAT WITH OR WITHOUT PRESENCE OF ULCERS (sore throat, sores in mouth, or a toothache) ?UNUSUAL RASH, SWELLING OR PAIN  ?UNUSUAL VAGINAL DISCHARGE OR ITCHING  ? ?Items with * indicate a potential emergency and should be followed up as soon as possible or go to the Emergency Department if any problems should occur. ? ?Please show the CHEMOTHERAPY ALERT CARD or IMMUNOTHERAPY ALERT CARD at  check-in to the Emergency Department and triage nurse. ? ?Should you have questions after your visit or need to cancel or reschedule your appointment, please contact [facility]  Dept: [phone]  and follow the prompts.  Office hours are 8:00 a.m. to 4:30 p.m. Monday - Friday. Please note that voicemails left after 4:00 p.m. may not be returned until the following business day.  We are closed weekends and major holidays. You have access to a nurse at all times for urgent questions. Please call the main number to the clinic Dept: [phone] and follow the prompts. ? ? ?For any non-urgent questions, you may also contact your provider using MyChart. We now offer e-Visits for anyone 18 and older to request care online for non-urgent symptoms. For details visit [email or web address]. ?  ?Also download the MyChart app! Go to the app store, search "MyChart", open the app, select [facility], and log in with your MyChart username and password. ? ?Due to Covid, a mask is required upon entering the hospital/clinic. If you do not have a mask, one will be given to you upon arrival. For doctor visits, patients may have 1 support person aged 18 or older with them. For treatment visits, patients cannot have anyone with them due to current Covid guidelines and our immunocompromised population.  ? ?

## 2021-11-01 NOTE — Progress Notes (Signed)
Radiation Oncology         [phone] ________________________________  Name: Rebecca [name] MRN: [id number]  Date: 11/01/2021  DOB: 02-05-1966  Follow-Up Visit Note  Outpatient  CC: Rebecca Rakes, MD  Rebecca Rakes, MD  Diagnosis and Prior Radiotherapy:    ICD-10-CM   1. Malignant neoplasm of lower-inner quadrant of right breast of female, estrogen receptor positive ([place])  C50.311 hyaluronate sodium (RADIAPLEXRX) gel   S28.3 non-metallic deodorant (ALRA) 1 application    Ambulatory referral to Physical Therapy    2. Right shoulder pain, unspecified chronicity  M25.511 Ambulatory referral to Physical Therapy      Radiation Treatment Dates: 08/16/2021 through 09/29/2021 Site Technique Total Dose (Gy) Dose per Fx (Gy) Completed Fx Beam Energies  Breast, Right: Breast_Rt_IMN 3D 50/50 2 25/25 10X  Breast, Right: Breast_Rt_PAB_SCV 3D 50/50 2 25/25 10X, 15X  Breast, Right: Breast_Rt_Bst 3D 10/10 2 5/5 6X, 10X     CHIEF COMPLAINT: Here for follow-up and surveillance of right breast cancer  Narrative:  The patient returns today for routine follow-up.  Rebecca [name] presents today for follow-up after completing radiation to her right breast on 09/29/2021  Pain: Reports lingering pain to right breast (was evaluated by Rebecca [name] and reassured that pain was not related to her cancer. Possibly side effect from radiation) Skin: Reports continued dry peeling to breast.  ROM: Reports discomfort in her right shoulder when she tries to lift her arm above her head. Also reports pain to whole right arm when she attempts to lift anything or while she's doing chores (such as cleaning the kitchen) Lymphedema: Patient denies MedOnc F/U: Will see Rebecca [name] before next scheduled infusion Other issues of note: Pervious ear pain has improved/lessened but is still present. Otherwise denies any new issues or concerns. Would be interested in a PT referral to help with right  arm/shoulder discomfort  Pt reports Yes No Comments  Tamoxifen _0  _1  N/A   Letrozole _2  _3  N/A   Anastrazole _4  _5  N/A   Mammogram _6  Date: TBD _7                                   ALLERGIES:  is allergic to lisinopril.  Meds: Current Outpatient Medications  Medication Sig Dispense Refill   acetaminophen (TYLENOL) 500 MG tablet Take 1 tablet (500 mg total) by mouth 3 (three) times daily as needed (with one aleve tablet). 30 tablet 0   amLODipine (NORVASC) 10 MG tablet TAKE 1 TABLET BY MOUTH EVERY DAY 90 tablet 0   Ascorbic Acid (VITAMIN C) 1000 MG tablet Take 1 tablet (1,000 mg total) by mouth daily. 30 tablet 0   atorvastatin (LIPITOR) 80 MG tablet TAKE 1 TABLET BY MOUTH EVERY DAY 90 tablet 1   azelastine (ASTELIN) 0.1 % nasal spray Place 2 sprays into both nostrils 2 (two) times daily. Use in each nostril as directed 30 mL 12   Blood Glucose Monitoring Suppl (ONETOUCH VERIO REFLECT) w/Device KIT 1 kit by Does not apply route in the morning, at noon, and at bedtime. Use as directed to test blood sugar up to three times daily.E11.9 1 kit 0   cetirizine (ZYRTEC) 10 MG tablet Take 1 tablet (10 mg total) by mouth daily. 30 tablet 0   dapagliflozin propanediol (FARXIGA) 10 MG TABS tablet Take 1 tablet (10 mg total) by mouth daily with breakfast. 90 tablet 1   ergocalciferol (DRISDOL) 1.25  MG (50000 UT) capsule Take 1 capsule (50,000 Units total) by mouth once a week. 12 capsule 0   fluconazole (DIFLUCAN) 100 MG tablet Take 1 tablet (100 mg total) by mouth daily. 10 tablet 0   fluticasone (FLONASE) 50 MCG/ACT nasal spray Place 2 sprays into both nostrils daily. 15.8 mL 2   gabapentin (NEURONTIN) 100 MG capsule Take 1 capsule (100 mg total) by mouth 2 (two) times daily. Take early in the morning and at midday. 60 capsule 6   gabapentin (NEURONTIN) 300 MG capsule Take 1 capsule (300 mg total) by mouth at bedtime. 90 capsule 4   glipiZIDE (GLUCOTROL) 5 MG tablet TAKE 1 TABLET BY MOUTH DAILY 90  tablet 1   glucose blood (ONETOUCH VERIO) test strip Use as directed to test blood sugar up to three times daily. E11.9 100 each 6   labetalol (NORMODYNE) 100 MG tablet Take 100 mg by mouth 2 (two) times daily.     Lancets (ONETOUCH DELICA PLUS [id number]) MISC 1 Device by Does not apply route in the morning, at noon, and at bedtime. Use as directed to test blood sugar up to three times daily. E11.9 100 each 6   lidocaine-prilocaine (EMLA) cream APPLY TO AFFECTED AREA AS DIRECTED 30 g 3   metFORMIN (GLUCOPHAGE) 500 MG tablet TAKE 1 TABLET BY MOUTH TWICE DAILY 180 tablet 1   methocarbamol (ROBAXIN) 500 MG tablet Take 1 tablet (500 mg total) by mouth every 8 (eight) hours as needed for muscle spasms. 90 tablet 0   naproxen sodium (ALEVE) 220 MG tablet Take 1 tablet (220 mg total) by mouth 3 (three) times daily as needed (with one extra strength tylenol 512m).     Omega-3 1000 MG CAPS Take 1 capsule (1,000 mg total) by mouth daily. 30 capsule 0   omeprazole (PRILOSEC) 20 MG capsule Take 1 capsule (20 mg total) by mouth daily. 30 capsule 0   vitamin B-12 (CYANOCOBALAMIN) 100 MCG tablet Take 1 tablet (100 mcg total) by mouth daily. 30 tablet 0   No current facility-administered medications for this encounter.    Physical Findings: The patient is in no acute distress. Patient is alert and oriented.  vitals were not taken for this visit. .    Satisfactory skin healing in radiotherapy fields. There is residual hyperpigmentation of right torso/ SCV region/breast  Tenderness in right shoulder/upper arm to palpation.   Lab Findings: Lab Results  Component Value Date   WBC 3.9 (L) 11/01/2021   HGB 13.1 11/01/2021   HCT 40.7 11/01/2021   MCV 79.8 (L) 11/01/2021   PLT 227 11/01/2021    Radiographic Findings: MR Brain W Wo Contrast  Result Date: 10/18/2021 CLINICAL DATA:  Invasive breast cancer, stage IV.  Staging. EXAM: MRI HEAD WITHOUT AND WITH CONTRAST TECHNIQUE: Multiplanar, multiecho pulse  sequences of the brain and surrounding structures were obtained without and with intravenous contrast. CONTRAST:  665mGADAVIST GADOBUTROL 1 MMOL/ML IV SOLN COMPARISON:  None. FINDINGS: Brain: No abnormal enhancement or swelling to suggest metastatic disease. 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 Small remote left cerebellar infarction. No hemorrhage, hydrocephalus, or collection. Vascular: Normal flow voids and vascular enhancements. Skull and upper cervical spine: Normal marrow signal. Sinuses/Orbits: Negative IMPRESSION: No evidence of metastatic disease. 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 Electronically Signed   By: JoJorje Guild.D.   On: 10/18/2021 11:26    Impression/Plan: Healing well from radiotherapy to the breast tissue.  We  will refer her to PT for right shoulder/arm pain. She states it started soon after surgery for breast cancer.   Continue skin care with topical Vitamin E Oil and / or lotion for at least 2 more months for further healing.  I encouraged her to continue with yearly mammography as appropriate (for intact breast tissue) and followup with medical oncology. I will see her back on an as-needed basis. I have encouraged her to call if she has any issues or concerns in the future. I wished her the very best.  On date of service, in total, I spent 20 minutes on this encounter. Patient was seen in person.  _____________________________________   Eppie Gibson, MD

## 2021-11-01 NOTE — Progress Notes (Signed)
Rebecca [name] presents today for follow-up after completing radiation to her right breast on 09/29/2021  Pain: Reports lingering pain to right breast (was evaluated by Annabelle Harman and reassured that pain was not related to her cancer. Possibly side effect from radiation) Skin: Reports continued dry peeling to breast.  ROM: Reports discomfort in her right shoulder when she tries to lift her arm above her head. Also reports pain to whole right arm when she attempts to lift anything or while she's doing chores (such as cleaning the kitchen) Lymphedema: Patient denies MedOnc F/U: Will see Dr. Arletha Pili Iruku before next scheduled infusion Other issues of note: Pervious ear pain has improved/lessened but is still present. Otherwise denies any new issues or concerns. Would be interested in a PT referral to help with right arm/shoulder discomfort  Pt reports Yes No Comments  Tamoxifen []  [x]  N/A   Letrozole []  [x]  N/A   Anastrazole []  [x]  N/A   Mammogram [x]  Date: TBD [] 

## 2021-11-03 ENCOUNTER — Encounter: Payer: Self-pay | Admitting: Oncology

## 2021-11-03 ENCOUNTER — Encounter: Payer: Self-pay | Admitting: Adult Health

## 2021-11-03 NOTE — Assessment & Plan Note (Signed)
Rebecca [name] has a history of stage IIIb triple negative breast cancer.  She continues on maintenance pembrolizumab.  1.  Triple negative breast cancer: She will continue on pembrolizumab treatment.  I am concerned about her diffuse pain.  I have ordered a bone scan for restaging.  We will also evaluate inflammatory markers with her next lab draw.  2.  Breast pain: This is likely secondary to some radiation changes in the breast.  Her breast exam is benign.  She is very encouraged about this.  Somehow she has not been scheduled for provider follow-up with treatment appointments.  I have sent a scheduling message to make sure that this happens.  We will see her back in 3 weeks for labs, follow-up, her next pembrolizumab.

## 2021-11-03 NOTE — Progress Notes (Signed)
[facility] Follow up:    Rebecca Rakes, MD [place] [address]   DIAGNOSIS:  Cancer Staging  Malignant neoplasm of lower-inner quadrant of right breast of female, estrogen receptor positive ([place]) Staging form: Breast, AJCC 8th Edition - Clinical: Stage IIIB (cT2, cN1, cM0, G3, ER-, PR-, HER2-) - Signed by Chauncey Cruel, MD on 01/18/2021 Stage prefix: Initial diagnosis Histologic grading system: 3 grade system   SUMMARY OF ONCOLOGIC HISTORY: Oncology History  Malignant neoplasm of lower-inner quadrant of right breast of female, estrogen receptor positive ([place])  01/06/2021 Initial Diagnosis   [place] woman status post right breast lower inner quadrant biopsy 01/06/2021 for a clinically T2 N1, stage IIIB 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  The report date is January 31, 2021.  The CancerNext-Expanded gene panel offered by [facility] and includes sequencing, rearrangement, and RNA analysis for the following 77 genes: AIP, ALK, APC, ATM, AXIN2, BAP1, BARD1, BLM, BMPR1A, BRCA1, BRCA2, BRIP1, CDC73, CDH1, CDK4, CDKN1B, CDKN2A, CHEK2, CTNNA1, DICER1, FANCC, FH, FLCN, GALNT12, KIF1B, LZTR1, MAX, MEN1, MET, MLH1, MSH2, MSH3, MSH6, MUTYH, NBN, NF1, NF2, NTHL1, PALB2, PHOX2B, PMS2, POT1, PRKAR1A, PTCH1, PTEN, RAD51C, RAD51D, RB1, RECQL,  RET, SDHA, SDHAF2, SDHB, SDHC, SDHD, SMAD4, SMARCA4, SMARCB1, SMARCE1, STK11, SUFU, TMEM127, TP53, TSC1, TSC2, VHL and XRCC2 (sequencing and deletion/duplication); EGFR, EGLN1, HOXB13, KIT, MITF, PDGFRA, POLD1, and POLE (sequencing only); EPCAM and GREM1 (deletion/duplication only).    02/07/2021 - 05/23/2021 Neo-Adjuvant Chemotherapy    neoadjuvant chemotherapy consisting of pembrolizumab given day 1 together with carboplatin and paclitaxel, with Taxol given on day 8 and 15, and filgrastim on days 16,17 and 18, started 02/07/2021, repeated every 21 days x 2, followed by doxorubicin and cyclophosphamide every 21 days x 4 also given with pembrolizumab on day 1 and PEG filgrastim day 3, started 03/22/2021 and completed 05/23/2021             (a) carboplatin/paclitaxel discontinued after the cycle 2-day 8 dose with neuropathy developing (subsequently resolved)   06/13/2021 -  Chemotherapy   Patient is on Treatment Plan : HEAD/NECK Pembrolizumab Q21D     07/11/2021 Surgery   right lumpectomy and sentinel lymph node sampling 07/11/2021 showed a complete pathologic response (ypT0 ypN0)             (a) a total of 3 right axillary lymph nodes were removed   08/16/2021 - 09/29/2021 Radiation Therapy   08/16/2021 through 09/29/2021 Site Technique Total Dose (Gy) Dose per Fx (Gy) Completed Fx Beam Energies  Breast, Right: Breast_Rt_IMN 3D 50/50 2 25/25 10X  Breast, Right: Breast_Rt_PAB_SCV 3D 50/50 2 25/25 10X, 15X  Breast, Right: Breast_Rt_Bst 3D 10/10 2 5/5 6X, 10X      CURRENT THERAPY: Pembrolizumab  INTERVAL HISTORY: Rebecca [name] 56 y.o. female is  here for urgent evaluation secondary to right breast pain.  She also notes that she is having diffuse bony pain over her body.  She notes that this is persistent despite her taking Tylenol or Aleve for the pain.     Patient Active Problem List   Diagnosis Date Noted   Right otitis media 09/26/2021   Port-A-Cath in place 02/07/2021   Genetic  testing 02/02/2021   Family history of throat cancer 01/18/2021   Family history of lymphoma 01/18/2021   Malignant neoplasm of lower-inner quadrant of right breast of female, estrogen receptor positive ([place]) 01/12/2021   Posterior tibial tendinitis, right leg 01/26/2020   Diabetes ([place]) 03/11/2017   Muscle pain 08/06/2013   Tiredness 08/06/2013   [place] vaginitis 08/19/2012   Dental cavities 08/19/2012   Knee pain, bilateral 04/16/2012   Diabetes mellitus type 2, controlled ([place]) 04/16/2012   Hyperlipidemia 04/16/2012   Palpitations 04/16/2012   Back pain 04/16/2012    is allergic to lisinopril.  MEDICAL HISTORY: Past Medical History:  Diagnosis Date   Arthritis    Breast cancer ([place])    Diabetes mellitus    Family history of lymphoma 01/18/2021   Family history of throat cancer 01/18/2021   Hyperlipidemia    Hypertension     SURGICAL HISTORY: Past Surgical History:  Procedure Laterality Date   BREAST LUMPECTOMY WITH RADIOACTIVE SEED AND SENTINEL LYMPH NODE BIOPSY Right 07/11/2021   Procedure: RIGHT BREAST LUMPECTOMY WITH RADIOACTIVE SEED AND RIGHT SENTINEL LYMPH NODE BIOPSY;  Surgeon: Erroll Luna, MD;  Location: [facility];  Service: General;  Laterality: Right;   CESAREAN SECTION     PORTACATH PLACEMENT N/A 01/26/2021   Procedure: INSERTION PORT-A-CATH;  Surgeon: Erroll Luna, MD;  Location: [facility];  Service: General;  Laterality: N/A;  69   [place] Right 07/11/2021   Procedure: RADIOACTIVE SEED GUIDED RIGHT AXILLARY LYMPH NODE BIOPSY;  Surgeon: Erroll Luna, MD;  Location: [facility];  Service: General;  Laterality: Right;    SOCIAL HISTORY: Social History   Socioeconomic History   Marital status: Divorced    Spouse name: Not on file   Number of children: Not on file   Years of education: Not on file   Highest education level: Not on file  Occupational History   Not on file   Tobacco Use   Smoking status: Never    Passive exposure: Yes   Smokeless tobacco: Never  Vaping Use   Vaping Use: Never used  Substance and Sexual Activity   Alcohol use: No   Drug use: No   Sexual activity: Not Currently  Other Topics Concern   Not on file  Social History Narrative   Not on file   Social Determinants of Health   Financial Resource Strain: Not on file  Food Insecurity: Not on file  Transportation Needs: No Transportation Needs   Lack of Transportation (Medical): No   Lack of Transportation (Non-Medical): No  Physical Activity: Not on file  Stress: Not on file  Social Connections: Not on file  Intimate Partner Violence: Not on file    FAMILY HISTORY: Family History  Problem Relation Age of Onset   Diabetes Mother    Heart disease Mother    Diabetes Father    Throat cancer Father        d. 60s   Diabetes Sister    Diabetes Brother    Lymphoma Cousin        maternal  cousin; d. 7s    Review of Systems  Constitutional:  Negative for appetite change, chills, fatigue, fever and unexpected weight change.  HENT:   Negative for hearing loss, lump/mass and trouble swallowing.   Eyes:  Negative for eye problems and icterus.  Respiratory:  Negative for chest tightness, cough and shortness of breath.   Cardiovascular:  Negative for chest pain, leg swelling and palpitations.  Gastrointestinal:  Negative for abdominal distention, abdominal pain, constipation, diarrhea, nausea and vomiting.  Endocrine: Negative for hot flashes.  Genitourinary:  Negative for difficulty urinating.   Musculoskeletal:  Positive for arthralgias and myalgias.  Skin:  Negative for itching and rash.  Neurological:  Negative for dizziness, extremity weakness, headaches and numbness.  Hematological:  Negative for adenopathy. Does not bruise/bleed easily.  Psychiatric/Behavioral:  Negative for depression. The patient is not nervous/anxious.      PHYSICAL EXAMINATION  ECOG  PERFORMANCE STATUS: 1 - Symptomatic but completely ambulatory  See [facility] for vitals from infusion today.  Physical Exam Constitutional:      General: She is not in acute distress.    Appearance: Normal appearance. She is not toxic-appearing.  HENT:     Head: Normocephalic and atraumatic.  Eyes:     General: No scleral icterus. Cardiovascular:     Rate and Rhythm: Normal rate and regular rhythm.     Pulses: Normal pulses.     Heart sounds: Normal heart sounds.  Pulmonary:     Effort: Pulmonary effort is normal.     Breath sounds: Normal breath sounds.  Chest:     Comments: Bilateral breast exam is normal.  Right breast without any nodularity.  There are some radiation changes noted however no swelling is present. Abdominal:     General: Abdomen is flat. Bowel sounds are normal. There is no distension.     Palpations: Abdomen is soft.     Tenderness: There is no abdominal tenderness.  Musculoskeletal:        General: No swelling.     Cervical back: Neck supple.  Lymphadenopathy:     Cervical: No cervical adenopathy.  Skin:    General: Skin is warm and dry.     Findings: No rash.  Neurological:     General: No focal deficit present.     Mental Status: She is alert.  Psychiatric:        Mood and Affect: Mood normal.        Behavior: Behavior normal.    LABORATORY DATA:  CBC    Component Value Date/Time   WBC 3.9 (L) 11/01/2021 0827   WBC 4.0 07/03/2021 1254   RBC 5.10 11/01/2021 0827   HGB 13.1 11/01/2021 0827   HCT 40.7 11/01/2021 0827   PLT 227 11/01/2021 0827   MCV 79.8 (L) 11/01/2021 0827   MCH 25.7 (L) 11/01/2021 0827   MCHC 32.2 11/01/2021 0827   RDW 14.4 11/01/2021 0827   LYMPHSABS 1.0 11/01/2021 0827   MONOABS 0.4 11/01/2021 0827   EOSABS 0.1 11/01/2021 0827   BASOSABS 0.0 11/01/2021 0827    CMP     Component Value Date/Time   NA 140 11/01/2021 0827   NA 140 12/27/2020 1123   K 3.8 11/01/2021 0827   CL 103 11/01/2021 0827   CO2 26 11/01/2021 0827    GLUCOSE 159 (H) 11/01/2021 0827   BUN 14 11/01/2021 0827   BUN 11 12/27/2020 1123   CREATININE 0.52 11/01/2021 0827   CREATININE 0.50 11/02/2016 1102   CALCIUM 9.5  11/01/2021 0827   PROT 7.5 11/01/2021 0827   PROT 7.5 12/27/2020 1123   ALBUMIN 4.2 11/01/2021 0827   ALBUMIN 4.7 12/27/2020 1123   AST 30 11/01/2021 0827   ALT 31 11/01/2021 0827   ALKPHOS 78 11/01/2021 0827   BILITOT 0.5 11/01/2021 0827   GFRNONAA >60 11/01/2021 0827   GFRNONAA >89 01/16/2016 1006   GFRAA 118 06/02/2020 0942   GFRAA >89 01/16/2016 1006         ASSESSMENT and THERAPY PLAN:   Malignant neoplasm of lower-inner quadrant of right breast of female, estrogen receptor positive ([place]) Leanna Sato has a history of stage IIIb triple negative breast cancer.  She continues on maintenance pembrolizumab.  1.  Triple negative breast cancer: She will continue on pembrolizumab treatment.  I am concerned about her diffuse pain.  I have ordered a bone scan for restaging.  We will also evaluate inflammatory markers with her next lab draw.  2.  Breast pain: This is likely secondary to some radiation changes in the breast.  Her breast exam is benign.  She is very encouraged about this.  Somehow she has not been scheduled for provider follow-up with treatment appointments.  I have sent a scheduling message to make sure that this happens.  We will see her back in 3 weeks for labs, follow-up, her next pembrolizumab.   Orders Placed This Encounter  Procedures   NM Bone Scan Whole Body    Standing Status:   Future    Standing Expiration Date:   11/03/2022    Order Specific Question:   If indicated for the ordered procedure, I authorize the administration of a radiopharmaceutical per Radiology protocol    Answer:   Yes    Order Specific Question:   Is the patient pregnant?    Answer:   No    Order Specific Question:   Preferred imaging location?    Answer:   [facility]   Sedimentation rate    Standing Status:    Standing    Number of Occurrences:   1    Standing Expiration Date:   11/03/2022   C-reactive protein    Standing Status:   Standing    Number of Occurrences:   1    Standing Expiration Date:   11/03/2022   CK total and CKMB    Standing Status:   Standing    Number of Occurrences:   1    Standing Expiration Date:   11/03/2022    All questions were answered. The patient knows to call the clinic with any problems, questions or concerns. We can certainly see the patient much sooner if necessary.  Total encounter time: 30 minutes in face-to-face visit time, chart review, lab review, care coordination, and documentation of the encounter.  Wilber Bihari, NP 11/03/21 9:27 AM Medical Oncology and Hematology [facility] [place], [place] [address] Tel. [phone]    Fax. [phone]  *Total Encounter Time as defined by the Centers for Medicare and Medicaid Services includes, in addition to the face-to-face time of a patient visit (documented in the note above) non-face-to-face time: obtaining and reviewing outside history, ordering and reviewing medications, tests or procedures, care coordination (communications with other health care professionals or caregivers) and documentation in the medical record.

## 2021-11-06 ENCOUNTER — Encounter: Payer: Self-pay | Admitting: Physical Therapy

## 2021-11-06 ENCOUNTER — Other Ambulatory Visit: Payer: Self-pay

## 2021-11-06 ENCOUNTER — Ambulatory Visit: Payer: 59 | Attending: Radiation Oncology | Admitting: Physical Therapy

## 2021-11-06 DIAGNOSIS — M6281 Muscle weakness (generalized): Secondary | ICD-10-CM | POA: Diagnosis not present

## 2021-11-06 DIAGNOSIS — M25512 Pain in left shoulder: Secondary | ICD-10-CM | POA: Diagnosis not present

## 2021-11-06 DIAGNOSIS — R293 Abnormal posture: Secondary | ICD-10-CM | POA: Diagnosis not present

## 2021-11-06 DIAGNOSIS — M25611 Stiffness of right shoulder, not elsewhere classified: Secondary | ICD-10-CM

## 2021-11-06 DIAGNOSIS — C50311 Malignant neoplasm of lower-inner quadrant of right female breast: Secondary | ICD-10-CM | POA: Diagnosis present

## 2021-11-06 DIAGNOSIS — M25511 Pain in right shoulder: Secondary | ICD-10-CM

## 2021-11-06 DIAGNOSIS — R262 Difficulty in walking, not elsewhere classified: Secondary | ICD-10-CM | POA: Diagnosis not present

## 2021-11-06 DIAGNOSIS — Z170 Estrogen receptor positive status [ER+]: Secondary | ICD-10-CM

## 2021-11-06 NOTE — Therapy (Signed)
[facility] @ [place], [place], [address] Phone: [phone]   Fax:  [phone]  Physical Therapy Evaluation  Patient Details  Name: Rebecca [name] MRN: [id number] Date of Birth: [birth year] Referring Provider (PT): Dr. Isidore Moos   Encounter Date: 11/06/2021   PT End of Session - 11/06/21 0952     Visit Number 1    Number of Visits 9    Date for PT Re-Evaluation 12/04/21    PT Start Time 0910    PT Stop Time 0950    PT Time Calculation (min) 40 min    Activity Tolerance Patient tolerated treatment well    Behavior During Therapy [facility] for tasks assessed/performed             Past Medical History:  Diagnosis Date   Arthritis    Breast cancer ([place])    Diabetes mellitus    Family history of lymphoma 01/18/2021   Family history of throat cancer 01/18/2021   Hyperlipidemia    Hypertension     Past Surgical History:  Procedure Laterality Date   BREAST LUMPECTOMY WITH RADIOACTIVE SEED AND SENTINEL LYMPH NODE BIOPSY Right 07/11/2021   Procedure: RIGHT BREAST LUMPECTOMY WITH RADIOACTIVE SEED AND RIGHT SENTINEL LYMPH NODE BIOPSY;  Surgeon: Erroll Luna, MD;  Location: [facility];  Service: General;  Laterality: Right;   CESAREAN SECTION     PORTACATH PLACEMENT N/A 01/26/2021   Procedure: INSERTION PORT-A-CATH;  Surgeon: Erroll Luna, MD;  Location: [facility];  Service: General;  Laterality: N/A;  60   RADIOACTIVE SEED GUIDED AXILLARY SENTINEL LYMPH NODE Right 07/11/2021   Procedure: RADIOACTIVE SEED GUIDED RIGHT AXILLARY LYMPH NODE BIOPSY;  Surgeon: Erroll Luna, MD;  Location: [facility];  Service: General;  Laterality: Right;    There were no vitals filed for this visit.    Subjective Assessment - 11/06/21 0910     Subjective I am having joint pain. It is hard to stand up if I am sitting. Hard to bend over and stand up for mopping. I started having L shoulder pain after  cleaning last week and I got water on the light and got a little shock. I jerked my arm up and hit the top of the stove and it has been hurting since. I am very weak. I can raise the right shoulder but when I do it starts to hurt. It has been that way since surgery.    Patient is accompained by: Interpreter    Pertinent History Patient was diagnosed on 01/03/2021 with right grade III invasive ductal carcinoma breast cancer. There are 2 areas that measure 1 cm and 1.1 cm and are located in the lower inner quadrant. It is weakly ER positive (10%), PR negative and HER2 negative with a Ki67 of 90%. She has a positive axillary lymph node. Underwent R lumpectomy and SLNB on 07/11/21 (3 nodes), has completed radiation in 09/2021 still receving infusions every 3 weeks    Patient Stated Goals wants to decrease pain to be able to return to work    Currently in Pain? Yes    Pain Score 7     Pain Location Hip   also said it is her back and her shoulder   Pain Orientation Right;Left    Pain Descriptors / Indicators Aching;Dull    Pain Type Surgical pain    Pain Onset More than a month ago    Pain Frequency Constant    Aggravating  Factors  moving, cooking    Pain Relieving Factors lying down    Effect of Pain on Daily Activities makes everything more difficult, difficulty sleeping                [facility] PT Assessment - 11/06/21 0001       Assessment   Medical Diagnosis Right breast cancer    Referring Provider (PT) Dr. Isidore Moos    Onset Date/Surgical Date 07/11/21    Hand Dominance Left    Prior Therapy baselines      Precautions   Precautions Other (comment)    Precaution Comments at risk of lymphedema      Restrictions   Weight Bearing Restrictions No      Balance Screen   Has the patient fallen in the past 6 months No    Has the patient had a decrease in activity level because of a fear of falling?  No    Is the patient reluctant to leave their home because of a fear of falling?  No       Home Environment   Living Environment Private residence    Living Arrangements Other relatives   Nephew   Available Help at Discharge Family      Prior Function   Level of Independence Independent    Vocation Unemployed    Animator    Leisure She does not exercise      Cognition   Overall Cognitive Status Within Functional Limits for tasks assessed      Functional Tests   Functional tests Sit to Stand      Sit to Stand   Comments 30 sec sit to stand: only able to complete 4 reps without use of UEs due to pain and weakness      Posture/Postural Control   Posture/Postural Control Postural limitations    Postural Limitations Rounded Shoulders;Forward head      ROM / Strength   AROM / PROM / Strength Strength      AROM   Right Shoulder Extension 28 Degrees    Right Shoulder Flexion 158 Degrees   with pain in lats and serratus and upper arm   Right Shoulder ABduction 152 Degrees    Right Shoulder Internal Rotation 31 Degrees    Right Shoulder External Rotation 90 Degrees    Left Shoulder Extension 40 Degrees    Left Shoulder Flexion 170 Degrees   p! in shoulder   Left Shoulder ABduction 155 Degrees    Left Shoulder Internal Rotation 70 Degrees    Left Shoulder External Rotation 70 Degrees      Strength   Overall Strength Within functional limits for tasks performed    Strength Assessment Site Hip;Knee    Right/Left Hip Right;Left    Right Hip Flexion 3/5    Left Hip Flexion 3/5    Right/Left Knee Right;Left    Right Knee Flexion 4/5    Right Knee Extension 3/5    Left Knee Flexion 3+/5    Left Knee Extension 3+/5                        Objective measurements completed on examination: See above findings.                     PT Long Term Goals - 11/06/21 0953       PT LONG TERM GOAL #1   Title Pt will demonstrate 165 degrees of  R shoulder abduction to allow her to reach out to the side without pain.     Baseline 152    Time 4    Period Weeks    Status New    Target Date 12/04/21      PT LONG TERM GOAL #2   Title Pt will decrease pain overall by 50% with standing activites to increase comfort    Time 4    Period Weeks    Status New    Target Date 12/04/21      PT LONG TERM GOAL #3   Title Pt will be able to complete 10 sit to stands without use of UEs in 30 sec to decrease fall risk.    Baseline 4    Time 4    Period Weeks    Status New    Target Date 12/04/21      PT LONG TERM GOAL #4   Title Pt will demonstrate 4/5 bilateral hip flexor strength to decrease fall risk    Baseline 3/5 bilaterally    Time 4    Period Weeks    Status New    Target Date 12/04/21      PT LONG TERM GOAL #5   Title Pt will be independent in a home exercise program for continued strengthening and stretching    Time 4    Period Weeks    Status New    Target Date 12/04/21      Additional Long Term Goals   Additional Long Term Goals Yes      PT LONG TERM GOAL #6   Title Pt will demonstrate 4/5 bilaeral quad strength to decrease fall risk.    Baseline R 3/5 L 3+/5    Time 4    Period Weeks    Status New    Target Date 12/04/21                    Plan - 11/06/21 0957     Clinical Impression Statement Pt reports to PT after undergoing a R breast lumpectomy and SLNB on 07/11/21 (0/3) for treatmetn of triple negative breast cancer. She completed radiation and is undergoing infusions every 3 weeks. Pt reports she was cleaning her oven and got shocked when she got water on the light bulb and pulled her arm back quickly and hit to top of the oven and has been having pain for the last week. Her L shoulder ROM is comparable to baseline. Her R shoulder is all painful with nearly all movements and she has lost ROM since her appointment here in Dec 2022. Pt also reports having pain in her hips, knees and shoulders. A full body MRI has been ordered but not scheduled yet. Pt has increased  weakness in bilateral hips and knees and she is only able to complete 4 sit to stands in 30 seconds. Pt would benefit from skilled PT services to improve bilateral LE strength, functional mobility, improve R shoulder ROM and decrease pain.    Examination-Activity Limitations Carry;Dressing;Sleep;Reach Overhead;Bend;Lift;Stairs;Locomotion Level;Stand;Squat;Bathing    Examination-Participation Restrictions Laundry;Cleaning;Shop;Occupation;Meal Prep    Stability/Clinical Decision Making Stable/Uncomplicated    Clinical Decision Making Low    Rehab Potential Good    PT Frequency 2x / week    PT Duration 4 weeks    PT Treatment/Interventions ADLs/Self Care Home Management;Therapeutic exercise;Patient/family education;Therapeutic activities;Neuromuscular re-education;Balance training;Manual techniques;Scar mobilization;Passive range of motion    PT Next Visit Plan ROM to bilateral shoulders, pulleys, ball, STM to  bilateral shoulders esp lats, strengthing LEs    Consulted and Agree with Plan of Care Patient             Patient will benefit from skilled therapeutic intervention in order to improve the following deficits and impairments:  Postural dysfunction, Decreased range of motion, Impaired UE functional use, Pain, Decreased knowledge of precautions, Increased fascial restricitons, Decreased strength, Decreased activity tolerance, Difficulty walking, Decreased endurance  Visit Diagnosis: Acute pain of left shoulder  Acute pain of right shoulder  Stiffness of right shoulder, not elsewhere classified  Muscle weakness (generalized)  Difficulty in walking, not elsewhere classified  Abnormal posture  Malignant neoplasm of lower-inner quadrant of right breast of female, estrogen receptor positive ([place])     Problem List Patient Active Problem List   Diagnosis Date Noted   Right otitis media 09/26/2021   Port-A-Cath in place 02/07/2021   Genetic testing 02/02/2021   Family history of  throat cancer 01/18/2021   Family history of lymphoma 01/18/2021   Malignant neoplasm of lower-inner quadrant of right breast of female, estrogen receptor positive ([place]) 01/12/2021   Posterior tibial tendinitis, right leg 01/26/2020   Diabetes ([place]) 03/11/2017   Muscle pain 08/06/2013   Tiredness 08/06/2013   [place]ida vaginitis 08/19/2012   Dental cavities 08/19/2012   Knee pain, bilateral 04/16/2012   Diabetes mellitus type 2, controlled ([place]) 04/16/2012   Hyperlipidemia 04/16/2012   Palpitations 04/16/2012   Back pain 04/16/2012    Allyson Sabal [place], PT 11/06/2021, 10:06 AM  [facility] @ [place], [place], [address] Phone: [phone]   Fax:  [phone]  Name: ASHYRA CANTIN MRN: [id number] Date of Birth: Oct 29, 1965

## 2021-11-07 ENCOUNTER — Encounter: Payer: Self-pay | Admitting: Oncology

## 2021-11-07 ENCOUNTER — Encounter: Payer: Self-pay | Admitting: Adult Health

## 2021-11-07 NOTE — Progress Notes (Signed)
Second physician form for [employer] for [place] left in provider yellow folder to complete and return to me.

## 2021-11-08 ENCOUNTER — Encounter: Payer: Self-pay | Admitting: Hematology and Oncology

## 2021-11-08 ENCOUNTER — Encounter: Payer: Self-pay | Admitting: Licensed Clinical Social Worker

## 2021-11-08 NOTE — Progress Notes (Signed)
[facility] CSW Progress Note  Clinical Education officer, museum contacted patient by phone with the help of telephonic interpreter to follow-up on request for rent assistance. Pt has utilized most resources already [employer], [employer], Publishing copy in [place], [place]).  CSW shared information on [employer] which may be able to find funding to help with her rent. Gave information on walk-in hours and documentation to bring to prove hardship/need. Pt expressed understanding and will go there.  She is also still waiting on disability decision, stating that she spoke with DDS in December and then received other paperwork but has not done anything since. CSW encouraged pt to continue to work with [facility] as they are helping with the application and can assist with appeal if she is denied.    Christeen Douglas , LCSW

## 2021-11-08 NOTE — Progress Notes (Signed)
Received signed physician form for [employer].  Faxed completed application to [employer] for copay assistance for [employer].  Fax received ok per confirmation sheet.

## 2021-11-09 ENCOUNTER — Other Ambulatory Visit: Payer: Self-pay

## 2021-11-09 ENCOUNTER — Ambulatory Visit: Payer: 59 | Admitting: Rehabilitation

## 2021-11-09 ENCOUNTER — Encounter: Payer: Self-pay | Admitting: Rehabilitation

## 2021-11-09 DIAGNOSIS — M25611 Stiffness of right shoulder, not elsewhere classified: Secondary | ICD-10-CM

## 2021-11-09 DIAGNOSIS — Z170 Estrogen receptor positive status [ER+]: Secondary | ICD-10-CM

## 2021-11-09 DIAGNOSIS — R293 Abnormal posture: Secondary | ICD-10-CM

## 2021-11-09 DIAGNOSIS — C50311 Malignant neoplasm of lower-inner quadrant of right female breast: Secondary | ICD-10-CM

## 2021-11-09 DIAGNOSIS — R262 Difficulty in walking, not elsewhere classified: Secondary | ICD-10-CM

## 2021-11-09 DIAGNOSIS — M25512 Pain in left shoulder: Secondary | ICD-10-CM

## 2021-11-09 DIAGNOSIS — M6281 Muscle weakness (generalized): Secondary | ICD-10-CM

## 2021-11-09 DIAGNOSIS — M25511 Pain in right shoulder: Secondary | ICD-10-CM

## 2021-11-09 NOTE — Therapy (Signed)
[facility] @ [place], [place], [address] Phone: [phone]   Fax:  [phone]  Physical Therapy Treatment  Patient Details  Name: Rebecca [name] MRN: [id number] Date of Birth: 08-Jun-1966 Referring Provider (PT): Dr. Isidore Moos   Encounter Date: 11/09/2021   PT End of Session - 11/09/21 1331     Visit Number 2    Number of Visits 9    Date for PT Re-Evaluation 12/04/21    PT Start Time 1232    PT Stop Time 6226    PT Time Calculation (min) 43 min    Activity Tolerance Patient tolerated treatment well    Behavior During Therapy [facility] for tasks assessed/performed             Past Medical History:  Diagnosis Date   Arthritis    Breast cancer ([place])    Diabetes mellitus    Family history of lymphoma 01/18/2021   Family history of throat cancer 01/18/2021   Hyperlipidemia    Hypertension     Past Surgical History:  Procedure Laterality Date   BREAST LUMPECTOMY WITH RADIOACTIVE SEED AND SENTINEL LYMPH NODE BIOPSY Right 07/11/2021   Procedure: RIGHT BREAST LUMPECTOMY WITH RADIOACTIVE SEED AND RIGHT SENTINEL LYMPH NODE BIOPSY;  Surgeon: Erroll Luna, MD;  Location: [facility];  Service: General;  Laterality: Right;   CESAREAN SECTION     PORTACATH PLACEMENT N/A 01/26/2021   Procedure: INSERTION PORT-A-CATH;  Surgeon: Erroll Luna, MD;  Location: [facility];  Service: General;  Laterality: N/A;  60   RADIOACTIVE SEED GUIDED AXILLARY SENTINEL LYMPH NODE Right 07/11/2021   Procedure: RADIOACTIVE SEED GUIDED RIGHT AXILLARY LYMPH NODE BIOPSY;  Surgeon: Erroll Luna, MD;  Location: [facility];  Service: General;  Laterality: Right;    There were no vitals filed for this visit.   Subjective Assessment - 11/09/21 1228     Subjective I feel okay to exercise    Pertinent History Patient was diagnosed on 01/03/2021 with right grade III invasive ductal carcinoma breast cancer. There  are 2 areas that measure 1 cm and 1.1 cm and are located in the lower inner quadrant. It is weakly ER positive (10%), PR negative and HER2 negative with a Ki67 of 90%. She has a positive axillary lymph node. Underwent R lumpectomy and SLNB on 07/11/21 (3 nodes), has completed radiation in 09/2021 still receving infusions every 3 weeks    Currently in Pain? Yes    Pain Score 6    legs, shoulders, back   Pain Location --   legs, shoulders, back                              [facility] Adult PT Treatment/Exercise - 11/09/21 0001       Exercises   Exercises Other Exercises;Shoulder    Other Exercises  seated on table education on core activation TrA 5" x 5 with vcs for breathing, TrA with slow march x 5 bil, alternating arm flexion x 5 bil, LAQ x 5 bil, scapular retraction x 5 bil, bil abduction stopping due to left shoulder pain, bil ER x 5, seated ball squeezes x 10 purple.sit to stand x 5 without hands.    Then in parallel bars: side steps x 2 lengths, heel raises x 10.  All with vcs and initial instruction.      Shoulder Exercises: Pulleys   Flexion 2 minutes  Flexion Limitations with vcs for relaxation and cueing                          PT Long Term Goals - 11/06/21 0953       PT LONG TERM GOAL #1   Title Pt will demonstrate 165 degrees of R shoulder abduction to allow her to reach out to the side without pain.    Baseline 152    Time 4    Period Weeks    Status New    Target Date 12/04/21      PT LONG TERM GOAL #2   Title Pt will decrease pain overall by 50% with standing activites to increase comfort    Time 4    Period Weeks    Status New    Target Date 12/04/21      PT LONG TERM GOAL #3   Title Pt will be able to complete 10 sit to stands without use of UEs in 30 sec to decrease fall risk.    Baseline 4    Time 4    Period Weeks    Status New    Target Date 12/04/21      PT LONG TERM GOAL #4   Title Pt will demonstrate 4/5 bilateral  hip flexor strength to decrease fall risk    Baseline 3/5 bilaterally    Time 4    Period Weeks    Status New    Target Date 12/04/21      PT LONG TERM GOAL #5   Title Pt will be independent in a home exercise program for continued strengthening and stretching    Time 4    Period Weeks    Status New    Target Date 12/04/21      Additional Long Term Goals   Additional Long Term Goals Yes      PT LONG TERM GOAL #6   Title Pt will demonstrate 4/5 bilaeral quad strength to decrease fall risk.    Baseline R 3/5 L 3+/5    Time 4    Period Weeks    Status New    Target Date 12/04/21                   Plan - 11/09/21 1331     Clinical Impression Statement First TE session today with low tolerance to all activities.  Increased back pain and joint pain and low tolerance to getting SOB/sweaty from TE.  Pt had a hard time activating core with max VCs using breath holding to accomplish.  Also lots of form difficulty with pulleys and TE despite vcs from interpreter.  Focused on TE to increase tolerance to activity and functional activities and pt noted she wasn't sure if she could lie on her back.    PT Frequency 2x / week    PT Duration 4 weeks    PT Treatment/Interventions ADLs/Self Care Home Management;Therapeutic exercise;Patient/family education;Therapeutic activities;Neuromuscular re-education;Balance training;Manual techniques;Scar mobilization;Passive range of motion    PT Next Visit Plan Does pt need a note printed - we tried to print eval but it was not working?   postural TE, core activation and activities, pulleys, STM to bilateral shoulders esp lats, strengthing LEs    Consulted and Agree with Plan of Care Patient             Patient will benefit from skilled therapeutic intervention in order to improve the following deficits and impairments:  Visit Diagnosis: Acute pain of left shoulder  Acute pain of right shoulder  Stiffness of right shoulder, not  elsewhere classified  Muscle weakness (generalized)  Difficulty in walking, not elsewhere classified  Abnormal posture  Malignant neoplasm of lower-inner quadrant of right breast of female, estrogen receptor positive ([place])     Problem List Patient Active Problem List   Diagnosis Date Noted   Right otitis media 09/26/2021   Port-A-Cath in place 02/07/2021   Genetic testing 02/02/2021   Family history of throat cancer 01/18/2021   Family history of lymphoma 01/18/2021   Malignant neoplasm of lower-inner quadrant of right breast of female, estrogen receptor positive ([place]) 01/12/2021   Posterior tibial tendinitis, right leg 01/26/2020   Diabetes ([place]) 03/11/2017   Muscle pain 08/06/2013   Tiredness 08/06/2013   Candida vaginitis 08/19/2012   Dental cavities 08/19/2012   Knee pain, bilateral 04/16/2012   Diabetes mellitus type 2, controlled ([place]) 04/16/2012   Hyperlipidemia 04/16/2012   Palpitations 04/16/2012   Back pain 04/16/2012    Stark Bray, PT 11/09/2021, 1:36 PM  [facility] @ [place], [place], [address] Phone: [phone]   Fax:  [phone]  Name: WYVONNE CARDA MRN: [id number] Date of Birth: 1966/03/11

## 2021-11-13 ENCOUNTER — Telehealth: Payer: Self-pay | Admitting: *Deleted

## 2021-11-13 NOTE — Telephone Encounter (Signed)
This RN returned VM left by pt asking about " do I have an appt tomorrow?"  Obtained identified VM- detailed message left informing she does not have an appt tomorrow with next appt on 11/21/2021 at 815.  This RN left her name for return call if needed.

## 2021-11-14 ENCOUNTER — Other Ambulatory Visit: Payer: Self-pay | Admitting: Family Medicine

## 2021-11-14 ENCOUNTER — Other Ambulatory Visit: Payer: 59

## 2021-11-14 ENCOUNTER — Ambulatory Visit: Payer: 59

## 2021-11-14 ENCOUNTER — Ambulatory Visit (HOSPITAL_COMMUNITY): Payer: 59

## 2021-11-14 ENCOUNTER — Other Ambulatory Visit (HOSPITAL_COMMUNITY): Payer: 59

## 2021-11-14 DIAGNOSIS — E1169 Type 2 diabetes mellitus with other specified complication: Secondary | ICD-10-CM

## 2021-11-14 NOTE — Telephone Encounter (Signed)
Requested medications are due for refill today.  yes  Requested medications are on the active medications list.  yes  Last refill. 05/18/2021  Future visit scheduled.   no  Notes to clinic.  Failed protocol d/t expired labs.    Requested Prescriptions  Pending Prescriptions Disp Refills   atorvastatin (LIPITOR) 80 MG tablet [Pharmacy Med Name: ATORVASTATIN 80MG  TABLETS] 90 tablet 1    Sig: TAKE 1 TABLET BY MOUTH EVERY DAY     Cardiovascular:  Antilipid - Statins Failed - 11/14/2021  3:34 AM      Failed - Total Cholesterol in normal range and within 360 days    Cholesterol, Total  Date Value Ref Range Status  11/11/2020 116 100 - 199 mg/dL Final          Failed - LDL in normal range and within 360 days    LDL Chol Calc (NIH)  Date Value Ref Range Status  11/11/2020 41 0 - 99 mg/dL Final          Failed - HDL in normal range and within 360 days    HDL  Date Value Ref Range Status  11/11/2020 46 >39 mg/dL Final          Failed - Triglycerides in normal range and within 360 days    Triglycerides  Date Value Ref Range Status  11/11/2020 178 (H) 0 - 149 mg/dL Final          Passed - Patient is not pregnant      Passed - Valid encounter within last 12 months    Recent Outpatient Visits           1 month ago Type 2 diabetes mellitus without complication, without long-term current use of insulin [facility])   [place], [place], [place]   2 months ago Other acute nonsuppurative otitis media of right ear, recurrence not specified   [place], Neoma Laming B, MD   4 months ago Type 2 diabetes mellitus without complication, without long-term current use of insulin ([place])   [place], [place]obong, MD   10 months ago Annual physical exam   [place], Enobong, MD   1 year ago Vasomotor symptoms due to menopause   [place], Enobong, MD

## 2021-11-15 ENCOUNTER — Other Ambulatory Visit: Payer: Self-pay

## 2021-11-15 ENCOUNTER — Encounter: Payer: Self-pay | Admitting: Rehabilitation

## 2021-11-15 ENCOUNTER — Ambulatory Visit: Payer: 59 | Admitting: Rehabilitation

## 2021-11-15 DIAGNOSIS — C50311 Malignant neoplasm of lower-inner quadrant of right female breast: Secondary | ICD-10-CM | POA: Diagnosis not present

## 2021-11-15 DIAGNOSIS — M25512 Pain in left shoulder: Secondary | ICD-10-CM

## 2021-11-15 DIAGNOSIS — R262 Difficulty in walking, not elsewhere classified: Secondary | ICD-10-CM

## 2021-11-15 DIAGNOSIS — R293 Abnormal posture: Secondary | ICD-10-CM

## 2021-11-15 DIAGNOSIS — M25511 Pain in right shoulder: Secondary | ICD-10-CM

## 2021-11-15 DIAGNOSIS — M6281 Muscle weakness (generalized): Secondary | ICD-10-CM

## 2021-11-15 DIAGNOSIS — M25611 Stiffness of right shoulder, not elsewhere classified: Secondary | ICD-10-CM

## 2021-11-15 NOTE — Therapy (Signed)
[facility] @ [facility] [place], [place], [address] Phone: [phone]   Fax:  [phone]  Physical Therapy Treatment  Patient Details  Name: Rebecca [name] MRN: [id number] Date of Birth: [birth year] Referring Provider (PT): Dr. Isidore Moos   Encounter Date: 11/15/2021   PT End of Session - 11/15/21 1453     Visit Number 3    Number of Visits 9    Date for PT Re-Evaluation 12/04/21    PT Start Time 6659    PT Stop Time 9357    PT Time Calculation (min) 42 min    Activity Tolerance Patient tolerated treatment well;Patient limited by pain    Behavior During Therapy [facility] for tasks assessed/performed             Past Medical History:  Diagnosis Date   Arthritis    Breast cancer ([place])    Diabetes mellitus    Family history of lymphoma 01/18/2021   Family history of throat cancer 01/18/2021   Hyperlipidemia    Hypertension     Past Surgical History:  Procedure Laterality Date   BREAST LUMPECTOMY WITH RADIOACTIVE SEED AND SENTINEL LYMPH NODE BIOPSY Right 07/11/2021   Procedure: RIGHT BREAST LUMPECTOMY WITH RADIOACTIVE SEED AND RIGHT SENTINEL LYMPH NODE BIOPSY;  Surgeon: Erroll Luna, MD;  Location: [facility];  Service: General;  Laterality: Right;   CESAREAN SECTION     PORTACATH PLACEMENT N/A 01/26/2021   Procedure: INSERTION PORT-A-CATH;  Surgeon: Erroll Luna, MD;  Location: [place];  Service: General;  Laterality: N/A;  60   RADIOACTIVE SEED GUIDED AXILLARY SENTINEL LYMPH NODE Right 07/11/2021   Procedure: RADIOACTIVE SEED GUIDED RIGHT AXILLARY LYMPH NODE BIOPSY;  Surgeon: Erroll Luna, MD;  Location: [facility];  Service: General;  Laterality: Right;    There were no vitals filed for this visit.   Subjective Assessment - 11/15/21 1404     Subjective My back is really hurting. I will try    Pertinent History Patient was diagnosed on 01/03/2021 with right grade III invasive  ductal carcinoma breast cancer. There are 2 areas that measure 1 cm and 1.1 cm and are located in the lower inner quadrant. It is weakly ER positive (10%), PR negative and HER2 negative with a Ki67 of 90%. She has a positive axillary lymph node. Underwent R lumpectomy and SLNB on 07/11/21 (3 nodes), has completed radiation in 09/2021 still receving infusions every 3 weeks    Currently in Pain? Yes    Pain Score 10-Worst pain ever    Pain Location --   mid back and shoulders   Pain Orientation Right;Left;Mid    Pain Descriptors / Indicators Aching;Dull    Pain Type Chronic pain    Pain Onset More than a month ago    Pain Frequency Constant                               [facility] Adult PT Treatment/Exercise - 11/15/21 0001       Exercises   Other Exercises  seated on table:  bil UT stretch 2x20", posterior shoulder rolls x 10, seated rhomboid stretch x 20", Then supine on plinth: LTR x 10, SKTC 2x15",DKTC x 20" pt enjoys [place] and has some pain relief.  TrA with ball squeeze, then seated on table LAQ x 5 bil,  alternating arm flexion x 5 bil, scapular retraction x 5 bil, heel  raises x 10.  All with vcs.  Attempted ball on wall but this was too painful for low back      Shoulder Exercises: Pulleys   Flexion 2 minutes    Flexion Limitations with vcs for relaxation and cueing                          PT Long Term Goals - 11/06/21 0953       PT LONG TERM GOAL #1   Title Pt will demonstrate 165 degrees of R shoulder abduction to allow her to reach out to the side without pain.    Baseline 152    Time 4    Period Weeks    Status New    Target Date 12/04/21      PT LONG TERM GOAL #2   Title Pt will decrease pain overall by 50% with standing activites to increase comfort    Time 4    Period Weeks    Status New    Target Date 12/04/21      PT LONG TERM GOAL #3   Title Pt will be able to complete 10 sit to stands without use of UEs in 30 sec to decrease  fall risk.    Baseline 4    Time 4    Period Weeks    Status New    Target Date 12/04/21      PT LONG TERM GOAL #4   Title Pt will demonstrate 4/5 bilateral hip flexor strength to decrease fall risk    Baseline 3/5 bilaterally    Time 4    Period Weeks    Status New    Target Date 12/04/21      PT LONG TERM GOAL #5   Title Pt will be independent in a home exercise program for continued strengthening and stretching    Time 4    Period Weeks    Status New    Target Date 12/04/21      Additional Long Term Goals   Additional Long Term Goals Yes      PT LONG TERM GOAL #6   Title Pt will demonstrate 4/5 bilaeral quad strength to decrease fall risk.    Baseline R 3/5 L 3+/5    Time 4    Period Weeks    Status New    Target Date 12/04/21                   Plan - 11/15/21 1453     Clinical Impression Statement Reported 10/10 back and neck pain upon arrival but was able to particpate in therapy and did not demonstrate objectively this level of pain overall.  Pt was able to perform supine activities today and did feel some relief with low back stretches.    PT Frequency 2x / week    PT Duration 4 weeks    PT Treatment/Interventions ADLs/Self Care Home Management;Therapeutic exercise;Patient/family education;Therapeutic activities;Neuromuscular re-education;Balance training;Manual techniques;Scar mobilization;Passive range of motion    PT Next Visit Plan Does pt need a note printed - we tried to print eval but it was not working?   postural TE, core activation and activities, pulleys, PROM Rt shoulder, strengthing LEs    Consulted and Agree with Plan of Care Patient             Patient will benefit from skilled therapeutic intervention in order to improve the following deficits and impairments:  Visit Diagnosis: Acute pain of left shoulder  Acute pain of right shoulder  Stiffness of right shoulder, not elsewhere classified  Muscle weakness  (generalized)  Difficulty in walking, not elsewhere classified  Abnormal posture     Problem List Patient Active Problem List   Diagnosis Date Noted   Right otitis media 09/26/2021   Port-A-Cath in place 02/07/2021   Genetic testing 02/02/2021   Family history of throat cancer 01/18/2021   Family history of lymphoma 01/18/2021   Malignant neoplasm of lower-inner quadrant of right breast of female, estrogen receptor positive ([place]) 01/12/2021   Posterior tibial tendinitis, right leg 01/26/2020   Diabetes ([place]) 03/11/2017   Muscle pain 08/06/2013   Tiredness 08/06/2013   [place]ida vaginitis 08/19/2012   Dental cavities 08/19/2012   Knee pain, bilateral 04/16/2012   Diabetes mellitus type 2, controlled ([place]) 04/16/2012   Hyperlipidemia 04/16/2012   Palpitations 04/16/2012   Back pain 04/16/2012    Stark Bray, PT 11/15/2021, 2:56 PM  [facility] @ [facility] [place], [place], [address] Phone: [phone]   Fax:  [phone]  Name: Rebecca [name] MRN: [id number] Date of Birth: 12/04/1965

## 2021-11-15 NOTE — Patient Instructions (Signed)
Access Code: [email or web address] URL: [email or web address]: 01/25/2023Prepared by: Marcene Brawn TevisExercises  Standing Marching - 1 x daily - 7 x weekly - 1 sets - 10 reps  Standing Heel Raise with Support - 1 x daily - 7 x weekly - 1 sets - 10 reps  Sit to Stand - 1-3 x daily - 7 x weekly - 1 sets - 5 reps - no hold  Seated Cervical Sidebending Stretch - 1 x daily - 7 x weekly - 1 sets - 2 reps - 20-30 seconds hold  Supine Single Knee to Chest Stretch - 1 x daily - 7 x weekly - 1 sets - 2 reps - 20-30 seconds hold  Supine Double Knee to Chest - 1 x daily - 7 x weekly - 1 sets - 2 reps - 20-30 seconds hold  Supine Shoulder Flexion AAROM with Hands Clasped - 1 x daily - 7 x weekly - 1-2 sets - 10 reps - 5 second hold

## 2021-11-20 ENCOUNTER — Other Ambulatory Visit: Payer: Self-pay

## 2021-11-20 ENCOUNTER — Ambulatory Visit: Payer: 59

## 2021-11-20 DIAGNOSIS — M25512 Pain in left shoulder: Secondary | ICD-10-CM

## 2021-11-20 DIAGNOSIS — C50311 Malignant neoplasm of lower-inner quadrant of right female breast: Secondary | ICD-10-CM

## 2021-11-20 DIAGNOSIS — M25611 Stiffness of right shoulder, not elsewhere classified: Secondary | ICD-10-CM

## 2021-11-20 DIAGNOSIS — R293 Abnormal posture: Secondary | ICD-10-CM

## 2021-11-20 DIAGNOSIS — M6281 Muscle weakness (generalized): Secondary | ICD-10-CM

## 2021-11-20 DIAGNOSIS — Z170 Estrogen receptor positive status [ER+]: Secondary | ICD-10-CM

## 2021-11-20 DIAGNOSIS — M25511 Pain in right shoulder: Secondary | ICD-10-CM

## 2021-11-20 DIAGNOSIS — R262 Difficulty in walking, not elsewhere classified: Secondary | ICD-10-CM

## 2021-11-20 NOTE — Patient Instructions (Signed)
PELVIC TILT: Posterior    Tighten abdominals, flatten low back. _10__ reps per set, __2_ sets per day  Also:  Hold pelvic tilt and open and close knees        10 reps Hold pelvic tilt and march legs one at a time    10 reps    Hamstring Stretch    Inhale and straighten spine. Exhale and lean forward toward extended leg. Hold position for __3-5_ breaths. Inhale and come back to center. Repeat with other leg extended. Repeat __3_ times, alternating legs. Do _3-5__ times per day.   Cancer Rehab [phone]

## 2021-11-20 NOTE — Progress Notes (Signed)
[facility] Follow up:    [name], MD [place] [address]   DIAGNOSIS:  Cancer Staging  Malignant neoplasm of lower-inner quadrant of right breast of female, estrogen receptor positive ([place]) Staging form: Breast, AJCC 8th Edition - Clinical: Stage IIIB (cT2, cN1, cM0, G3, ER-, PR-, HER2-) - Signed by Chauncey Cruel, MD on 01/18/2021 Stage prefix: Initial diagnosis Histologic grading system: 3 grade system   SUMMARY OF ONCOLOGIC HISTORY: Oncology History  Malignant neoplasm of lower-inner quadrant of right breast of female, estrogen receptor positive ([place])  01/06/2021 Initial Diagnosis   [place] woman status post right breast lower inner quadrant biopsy 01/06/2021 for a clinically T2 N1, stage IIIB 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  The report date is January 31, 2021.  The CancerNext-Expanded gene panel offered by [facility] and includes sequencing, rearrangement, and RNA analysis for the following 77 genes: AIP, ALK, APC, ATM, AXIN2, BAP1, BARD1, BLM, BMPR1A, BRCA1, BRCA2, BRIP1, CDC73, CDH1, CDK4, CDKN1B, CDKN2A, CHEK2, CTNNA1, DICER1, FANCC, FH, FLCN, GALNT12, KIF1B, LZTR1, MAX, MEN1, MET, MLH1, MSH2, MSH3, MSH6, MUTYH, NBN, NF1, NF2, NTHL1, PALB2, PHOX2B, PMS2, POT1, PRKAR1A, PTCH1, PTEN, RAD51C, RAD51D, RB1, RECQL,  RET, SDHA, SDHAF2, SDHB, SDHC, SDHD, SMAD4, SMARCA4, SMARCB1, SMARCE1, STK11, SUFU, TMEM127, TP53, TSC1, TSC2, VHL and XRCC2 (sequencing and deletion/duplication); EGFR, EGLN1, HOXB13, KIT, MITF, PDGFRA, POLD1, and POLE (sequencing only); EPCAM and GREM1 (deletion/duplication only).    02/07/2021 - 05/23/2021 Neo-Adjuvant Chemotherapy    neoadjuvant chemotherapy consisting of pembrolizumab given day 1 together with carboplatin and paclitaxel, with Taxol given on day 8 and 15, and filgrastim on days 16,17 and 18, started 02/07/2021, repeated every 21 days x 2, followed by doxorubicin and cyclophosphamide every 21 days x 4 also given with pembrolizumab on day 1 and PEG filgrastim day 3, started 03/22/2021 and completed 05/23/2021             (a) carboplatin/paclitaxel discontinued after the cycle 2-day 8 dose with neuropathy developing (subsequently resolved)   06/13/2021 -  Chemotherapy   Patient is on Treatment Plan : HEAD/NECK Pembrolizumab Q21D     07/11/2021 Surgery   right lumpectomy and sentinel lymph node sampling 07/11/2021 showed a complete pathologic response (ypT0 ypN0)             (a) a total of 3 right axillary lymph nodes were removed   08/16/2021 - 09/29/2021 Radiation Therapy   08/16/2021 through 09/29/2021 Site Technique Total Dose (Gy) Dose per Fx (Gy) Completed Fx Beam Energies  Breast, Right: Breast_Rt_IMN 3D 50/50 2 25/25 10X  Breast, Right: Breast_Rt_PAB_SCV 3D 50/50 2 25/25 10X, 15X  Breast, Right: Breast_Rt_Bst 3D 10/10 2 5/5 6X, 10X      CURRENT THERAPY: Pembrolizumab  INTERVAL HISTORY: Jenisse A Nowaczyk 56 y.o. female is  here for follow up accompanied by an Arabic interpreter..  She continues on treatment with pembrolizumab every 3 weeks.  She does continue to experience pain and fatigue.  I had placed orders for a bone scan 3 weeks ago, however she says that they called and gave her an appointment time that she could not make and she communicated that with them and was  told she would get a return call and says that she never did.  She also notes that she went to ear nose and throat to have someone look in her ear due to some ear pain she had been experiencing and when she arrived for her appointment there was no one at the office.  She wants to talk to someone in social work and our colleague who completes the disability paperwork as she is struggling to financially afford things and she heard back from the disability paperwork saying that she needed additional physician recommendations from her primary care provider.  Ellen's blood sugar is over 300 this morning.  She says that she has a very difficult time not eating cookies and cakes.  She is struggling very much with restricting herself from sweets.  She continues to see the physical therapy team for her breast pain and lymphedema.   Patient Active Problem List   Diagnosis Date Noted   Right otitis media 09/26/2021   Port-A-Cath in place 02/07/2021   Genetic testing 02/02/2021   Family history of throat cancer 01/18/2021   Family history of lymphoma 01/18/2021   Malignant neoplasm of lower-inner quadrant of right breast of female, estrogen receptor positive ([place]) 01/12/2021   Posterior tibial tendinitis, right leg 01/26/2020   Diabetes ([place]) 03/11/2017   Muscle pain 08/06/2013   Tiredness 08/06/2013   Candida vaginitis 08/19/2012   Dental cavities 08/19/2012   Knee pain, bilateral 04/16/2012   Diabetes mellitus type 2, controlled ([place]) 04/16/2012   Hyperlipidemia 04/16/2012   Palpitations 04/16/2012   Back pain 04/16/2012    is allergic to lisinopril and heparin sod (pork) lock flush.  MEDICAL HISTORY: Past Medical History:  Diagnosis Date   Arthritis    Breast cancer ([place])    Diabetes mellitus    Family history of lymphoma 01/18/2021   Family history of throat cancer 01/18/2021   Hyperlipidemia    Hypertension     SURGICAL HISTORY: Past Surgical History:  Procedure Laterality Date    BREAST LUMPECTOMY WITH RADIOACTIVE SEED AND SENTINEL LYMPH NODE BIOPSY Right 07/11/2021   Procedure: RIGHT BREAST LUMPECTOMY WITH RADIOACTIVE SEED AND RIGHT SENTINEL LYMPH NODE BIOPSY;  Surgeon: Erroll Luna, MD;  Location: [facility];  Service: General;  Laterality: Right;   [facility] N/A 01/26/2021   Procedure: INSERTION PORT-A-CATH;  Surgeon: Erroll Luna, MD;  Location: [facility];  Service: General;  Laterality: N/A;  [place] Right 07/11/2021   Procedure: RADIOACTIVE SEED GUIDED RIGHT AXILLARY LYMPH NODE BIOPSY;  Surgeon: Erroll Luna, MD;  Location: [facility];  Service: General;  Laterality: Right;    SOCIAL HISTORY: Social History   Socioeconomic History   Marital status: Divorced    Spouse name: Not on file   Number of children: Not on file   Years of education: Not on file   Highest education level: Not on file  Occupational History   Not on file  Tobacco Use   Smoking status: Never    Passive exposure: Yes  Smokeless tobacco: Never  Vaping Use   Vaping Use: Never used  Substance and Sexual Activity   Alcohol use: No   Drug use: No   Sexual activity: Not Currently  Other Topics Concern   Not on file  Social History Narrative   Not on file   Social Determinants of Health   Financial Resource Strain: Not on file  Food Insecurity: Not on file  Transportation Needs: No Transportation Needs   Lack of Transportation (Medical): No   Lack of Transportation (Non-Medical): No  Physical Activity: Not on file  Stress: Not on file  Social Connections: Not on file  Intimate Partner Violence: Not on file    FAMILY HISTORY: Family History  Problem Relation Age of Onset   Diabetes Mother    Heart disease Mother    Diabetes Father    Throat cancer Father        d. 84s   Diabetes Sister    Diabetes Brother    Lymphoma Cousin        maternal cousin; d. 55s     Review of Systems  Constitutional:  Positive for fatigue. Negative for appetite change, chills, fever and unexpected weight change.  HENT:   Negative for hearing loss, lump/mass and trouble swallowing.   Eyes:  Negative for eye problems and icterus.  Respiratory:  Negative for chest tightness, cough and shortness of breath.   Cardiovascular:  Negative for chest pain, leg swelling and palpitations.  Gastrointestinal:  Negative for abdominal distention, abdominal pain, constipation, diarrhea, nausea and vomiting.  Endocrine: Negative for hot flashes.  Genitourinary:  Negative for difficulty urinating.   Musculoskeletal:  Positive for arthralgias and myalgias.  Skin:  Negative for itching and rash.  Neurological:  Negative for dizziness, extremity weakness, headaches and numbness.  Hematological:  Negative for adenopathy. Does not bruise/bleed easily.  Psychiatric/Behavioral:  Negative for depression. The patient is not nervous/anxious.      PHYSICAL EXAMINATION  ECOG PERFORMANCE STATUS: 1 - Symptomatic but completely ambulatory  See [facility] for vitals from infusion today.  Physical Exam Constitutional:      General: She is not in acute distress.    Appearance: Normal appearance. She is not toxic-appearing.  HENT:     Head: Normocephalic and atraumatic.  Eyes:     General: No scleral icterus. Cardiovascular:     Rate and Rhythm: Normal rate and regular rhythm.     Pulses: Normal pulses.     Heart sounds: Normal heart sounds.  Pulmonary:     Effort: Pulmonary effort is normal.     Breath sounds: Normal breath sounds.  Chest:     Comments: Breast exam deferred today Abdominal:     General: Abdomen is flat. Bowel sounds are normal. There is no distension.     Palpations: Abdomen is soft.     Tenderness: There is no abdominal tenderness.  Musculoskeletal:        General: No swelling.     Cervical back: Neck supple.  Lymphadenopathy:     Cervical: No cervical adenopathy.   Skin:    General: Skin is warm and dry.     Findings: No rash.  Neurological:     General: No focal deficit present.     Mental Status: She is alert.  Psychiatric:        Mood and Affect: Mood normal.        Behavior: Behavior normal.    LABORATORY DATA:  CBC    Component Value Date/Time  WBC 5.3 11/21/2021 0851   WBC 4.0 07/03/2021 1254   RBC 4.91 11/21/2021 0851   HGB 12.6 11/21/2021 0851   HCT 39.7 11/21/2021 0851   PLT 261 11/21/2021 0851   MCV 80.9 11/21/2021 0851   MCH 25.7 (L) 11/21/2021 0851   MCHC 31.7 11/21/2021 0851   RDW 14.2 11/21/2021 0851   LYMPHSABS 1.1 11/21/2021 0851   MONOABS 0.5 11/21/2021 0851   EOSABS 0.1 11/21/2021 0851   BASOSABS 0.0 11/21/2021 0851    CMP     Component Value Date/Time   NA 137 11/21/2021 0851   NA 140 12/27/2020 1123   K 3.9 11/21/2021 0851   CL 101 11/21/2021 0851   CO2 26 11/21/2021 0851   GLUCOSE 304 (H) 11/21/2021 0851   BUN 13 11/21/2021 0851   BUN 11 12/27/2020 1123   CREATININE 0.55 11/21/2021 0851   CREATININE 0.50 11/02/2016 1102   CALCIUM 9.4 11/21/2021 0851   PROT 7.2 11/21/2021 0851   PROT 7.5 12/27/2020 1123   ALBUMIN 4.1 11/21/2021 0851   ALBUMIN 4.7 12/27/2020 1123   AST 32 11/21/2021 0851   ALT 29 11/21/2021 0851   ALKPHOS 82 11/21/2021 0851   BILITOT 0.4 11/21/2021 0851   GFRNONAA >60 11/21/2021 0851   GFRNONAA >89 01/16/2016 1006   GFRAA 118 06/02/2020 0942   GFRAA >89 01/16/2016 1006    ASSESSMENT and THERAPY PLAN:   Malignant neoplasm of lower-inner quadrant of right breast of female, estrogen receptor positive ([place]) Leanna Sato has a history of stage IIIb triple negative breast cancer.  She continues on maintenance pembrolizumab.  1.  Triple negative breast cancer: She will continue on pembrolizumab treatment.  I have asked my nurse to call radiology scheduling and get her bone scan scheduled for a date and time that she can come.  Asked her to do that while the patient is here so that she can  know about the appointment date and time since there has been difficulty previously in contacting her over the phone.  2.  Breast pain: She will continue to follow-up with our physical therapy team about this.  3.  Ear pain: My nurse is calling the ear nose and throat office to work on getting her appointment rescheduled so she can give her the date and time prior to her departure from clinic due to barriers around contacting the patient over the phone.  4.  Financial concerns: Asked my nurse to reach out to [place] who helps [place] with her disability paperwork so that she can talk to her to better understand what the primary care office might need.  Also I placed a referral to our social worker to talk to the patient about financial resources and strategize to find funding to help with her housing insecurity.  We will see her back in 3 weeks for labs, follow-up, her next pembrolizumab.   Orders Placed This Encounter  Procedures   Ambulatory referral to Social Work    Referral Priority:   Routine    Referral Type:   Consultation    Referral Reason:   Specialty Services Required    Number of Visits Requested:   1    All questions were answered. The patient knows to call the clinic with any problems, questions or concerns. We can certainly see the patient much sooner if necessary.  Total encounter time: 20 minutes in face-to-face visit time, chart review, lab review, care coordination, and documentation of the encounter.  Wilber Bihari, NP 11/21/21 10:49 AM  Medical Oncology and Hematology [facility] [place], [place] [address] Tel. [phone]    Fax. [phone]   *Total Encounter Time as defined by the Centers for Medicare and Medicaid Services includes, in addition to the face-to-face time of a patient visit (documented in the note above) non-face-to-face time: obtaining and reviewing outside history, ordering and reviewing medications, tests or  procedures, care coordination (communications with other health care professionals or caregivers) and documentation in the medical record.

## 2021-11-20 NOTE — Therapy (Signed)
[place] @ [place], [place], [address] Phone: [phone]   Fax:  [phone]  Physical Therapy Treatment  Patient Details  Name: Rebecca [name] MRN: [id number] Date of Birth: 06/05/1966 Referring Provider (PT): Dr. Isidore Moos   Encounter Date: 11/20/2021   PT End of Session - 11/20/21 0922     Visit Number 4    Number of Visits 9    Date for PT Re-Evaluation 12/04/21    PT Start Time 7035   pt arrived late due to transportation   PT Stop Time 1002    PT Time Calculation (min) 49 min    Activity Tolerance Patient tolerated treatment well    Behavior During Therapy [facility] for tasks assessed/performed             Past Medical History:  Diagnosis Date   Arthritis    Breast cancer ([place])    Diabetes mellitus    Family history of lymphoma 01/18/2021   Family history of throat cancer 01/18/2021   Hyperlipidemia    Hypertension     Past Surgical History:  Procedure Laterality Date   BREAST LUMPECTOMY WITH RADIOACTIVE SEED AND SENTINEL LYMPH NODE BIOPSY Right 07/11/2021   Procedure: RIGHT BREAST LUMPECTOMY WITH RADIOACTIVE SEED AND RIGHT SENTINEL LYMPH NODE BIOPSY;  Surgeon: Erroll Luna, MD;  Location: [place];  Service: General;  Laterality: Right;   [place] N/A 01/26/2021   Procedure: INSERTION PORT-A-CATH;  Surgeon: Erroll Luna, MD;  Location: [place];  Service: General;  Laterality: N/A;  60   RADIOACTIVE SEED GUIDED AXILLARY SENTINEL LYMPH NODE Right 07/11/2021   Procedure: RADIOACTIVE SEED GUIDED RIGHT AXILLARY LYMPH NODE BIOPSY;  Surgeon: Erroll Luna, MD;  Location: [place];  Service: General;  Laterality: Right;    There were no vitals filed for this visit.   Subjective Assessment - 11/20/21 0915     Subjective I feel a little better than I did when I was here last.    Pertinent History Patient was diagnosed on 01/03/2021  with right grade III invasive ductal carcinoma breast cancer. There are 2 areas that measure 1 cm and 1.1 cm and are located in the lower inner quadrant. It is weakly ER positive (10%), PR negative and HER2 negative with a Ki67 of 90%. She has a positive axillary lymph node. Underwent R lumpectomy and SLNB on 07/11/21 (3 nodes), has completed radiation in 09/2021 still receving infusions every 3 weeks    Patient Stated Goals wants to decrease pain to be able to return to work    Currently in Pain? Yes    Pain Score 6     Pain Location --   pt just reports whole body hurs   Pain Descriptors / Indicators Aching;Dull    Pain Type Chronic pain    Pain Onset More than a month ago    Pain Frequency Intermittent    Aggravating Factors  moving, cooking    Pain Relieving Factors HEP exercises have been helping some                               [facility] Adult PT Treatment/Exercise - 11/20/21 0001       Self-Care   Self-Care Other Self-Care Comments    Other Self-Care Comments  Spent time educating pt about the importance of incorporating wlaking into daily routine and  trying to be more active with HEP.      Lumbar Exercises: Stretches   Single Knee to Chest Stretch Right;Left;3 reps;20 seconds    Lower Trunk Rotation 3 reps;20 seconds      Lumbar Exercises: Aerobic   Nustep Level 3 x 6 mins with therapist present monitoring pt throughout for fatigue or increased pain, but she did well with this reporting neither      Lumbar Exercises: Supine   Pelvic Tilt 10 reps;5 seconds    Pelvic Tilt Limitations pt returning therapist demo and tactile cues for correct muscle engagement and to relax upper body    Clam Other (comment)   3 sets of 3 reps   Clam Limitations cuing throughout for correct abdominal engagement    Bent Knee Raise Other (comment)   3 sets of 3 reps   Bent Knee Raise Limitations returning therapist demo, pt did better with correct pelvic tilt technique       Knee/Hip Exercises: Stretches   Quad Stretch Right;Left;1 rep;20 seconds    Quad Stretch Limitations tried grabbing ankle but pt reports too much pain so placed foot on mat table behind her with hands on wall for support. She reports this more tolerable      Shoulder Exercises: Pulleys   Flexion 2 minutes    Flexion Limitations Tactile and VCs to relax throughout, also with demo    ABduction 2 minutes    ABduction Limitations Same cuing as with flexion                          PT Long Term Goals - 11/06/21 0953       PT LONG TERM GOAL #1   Title Pt will demonstrate 165 degrees of R shoulder abduction to allow her to reach out to the side without pain.    Baseline 152    Time 4    Period Weeks    Status New    Target Date 12/04/21      PT LONG TERM GOAL #2   Title Pt will decrease pain overall by 50% with standing activites to increase comfort    Time 4    Period Weeks    Status New    Target Date 12/04/21      PT LONG TERM GOAL #3   Title Pt will be able to complete 10 sit to stands without use of UEs in 30 sec to decrease fall risk.    Baseline 4    Time 4    Period Weeks    Status New    Target Date 12/04/21      PT LONG TERM GOAL #4   Title Pt will demonstrate 4/5 bilateral hip flexor strength to decrease fall risk    Baseline 3/5 bilaterally    Time 4    Period Weeks    Status New    Target Date 12/04/21      PT LONG TERM GOAL #5   Title Pt will be independent in a home exercise program for continued strengthening and stretching    Time 4    Period Weeks    Status New    Target Date 12/04/21      Additional Long Term Goals   Additional Long Term Goals Yes      PT LONG TERM GOAL #6   Title Pt will demonstrate 4/5 bilaeral quad strength to decrease fall risk.    Baseline R 3/5 L  3+/5    Time 4    Period Weeks    Status New    Target Date 12/04/21                   Plan - 11/20/21 1006     Clinical Impression Statement Pt  comes in reporting feeling better than at last session as she has been doing the HEP stretches. Was able to progress pt with adding NuStep on light resistance of 3. Continued with bil LE flexibility and added core stability as she did well with this after multiple tactile and VCs along with demo. Issued these to HEP as pt reports she did not have pain when doing correctly. Also encouraged her to work on daily walking routine and trying to be more active during the day to help counteract the tightness and fatigue she has been experiencing more so lately.  Pt, with help of interpreter, able to verbalize good understanding and reports feeling better by end of session with less pain, reduced from 6/10 to 4/10.    Examination-Activity Limitations Carry;Dressing;Sleep;Reach Overhead;Bend;Lift;Stairs;Locomotion Level;Stand;Squat;Bathing    Examination-Participation Restrictions Laundry;Cleaning;Shop;Occupation;Meal Prep    Stability/Clinical Decision Making Stable/Uncomplicated    Rehab Potential Good    PT Frequency 2x / week    PT Duration 4 weeks    PT Treatment/Interventions ADLs/Self Care Home Management;Therapeutic exercise;Patient/family education;Therapeutic activities;Neuromuscular re-education;Balance training;Manual techniques;Scar mobilization;Passive range of motion    PT Next Visit Plan Does pt need a note printed - we tried to print eval but it was not working?   Cont postural TE, core activation and activities, pulleys, PROM Rt shoulder, strengthing LEs and encouraging pt to be more active during day    PT Home Exercise Plan Supine core stability and bil HS stretches    Consulted and Agree with Plan of Care Patient    Family Member Consulted Interpreter             Patient will benefit from skilled therapeutic intervention in order to improve the following deficits and impairments:  Postural dysfunction, Decreased range of motion, Impaired UE functional use, Pain, Decreased knowledge of  precautions, Increased fascial restricitons, Decreased strength, Decreased activity tolerance, Difficulty walking, Decreased endurance  Visit Diagnosis: Acute pain of left shoulder  Acute pain of right shoulder  Stiffness of right shoulder, not elsewhere classified  Muscle weakness (generalized)  Difficulty in walking, not elsewhere classified  Abnormal posture  Malignant neoplasm of lower-inner quadrant of right breast of female, estrogen receptor positive ([place])     Problem List Patient Active Problem List   Diagnosis Date Noted   Right otitis media 09/26/2021   Port-A-Cath in place 02/07/2021   Genetic testing 02/02/2021   Family history of throat cancer 01/18/2021   Family history of lymphoma 01/18/2021   Malignant neoplasm of lower-inner quadrant of right breast of female, estrogen receptor positive ([place]) 01/12/2021   Posterior tibial tendinitis, right leg 01/26/2020   Diabetes ([place]) 03/11/2017   Muscle pain 08/06/2013   Tiredness 08/06/2013   Candida vaginitis 08/19/2012   Dental cavities 08/19/2012   Knee pain, bilateral 04/16/2012   Diabetes mellitus type 2, controlled ([place]) 04/16/2012   Hyperlipidemia 04/16/2012   Palpitations 04/16/2012   Back pain 04/16/2012    Otelia Limes, PTA 11/20/2021, 10:17 AM  [facility] @ [place], [place], [address] Phone: [phone]   Fax:  [phone]  Name: Rebecca [name] MRN: [id number] Date of Birth: [birth year]

## 2021-11-21 ENCOUNTER — Encounter: Payer: Self-pay | Admitting: Licensed Clinical Social Worker

## 2021-11-21 ENCOUNTER — Inpatient Hospital Stay (HOSPITAL_BASED_OUTPATIENT_CLINIC_OR_DEPARTMENT_OTHER): Payer: 59 | Admitting: Adult Health

## 2021-11-21 ENCOUNTER — Encounter: Payer: Self-pay | Admitting: Oncology

## 2021-11-21 ENCOUNTER — Encounter: Payer: Self-pay | Admitting: Adult Health

## 2021-11-21 ENCOUNTER — Inpatient Hospital Stay: Payer: 59

## 2021-11-21 ENCOUNTER — Telehealth: Payer: Self-pay

## 2021-11-21 DIAGNOSIS — C50311 Malignant neoplasm of lower-inner quadrant of right female breast: Secondary | ICD-10-CM | POA: Diagnosis not present

## 2021-11-21 DIAGNOSIS — Z170 Estrogen receptor positive status [ER+]: Secondary | ICD-10-CM

## 2021-11-21 DIAGNOSIS — C773 Secondary and unspecified malignant neoplasm of axilla and upper limb lymph nodes: Secondary | ICD-10-CM | POA: Diagnosis not present

## 2021-11-21 DIAGNOSIS — Z5112 Encounter for antineoplastic immunotherapy: Secondary | ICD-10-CM | POA: Diagnosis not present

## 2021-11-21 DIAGNOSIS — I890 Lymphedema, not elsewhere classified: Secondary | ICD-10-CM | POA: Diagnosis not present

## 2021-11-21 DIAGNOSIS — Z95828 Presence of other vascular implants and grafts: Secondary | ICD-10-CM

## 2021-11-21 DIAGNOSIS — Z171 Estrogen receptor negative status [ER-]: Secondary | ICD-10-CM | POA: Diagnosis not present

## 2021-11-21 DIAGNOSIS — Z79899 Other long term (current) drug therapy: Secondary | ICD-10-CM | POA: Diagnosis not present

## 2021-11-21 DIAGNOSIS — H6591 Unspecified nonsuppurative otitis media, right ear: Secondary | ICD-10-CM | POA: Diagnosis not present

## 2021-11-21 LAB — CMP (CANCER CENTER ONLY)
ALT: 29 U/L (ref 0–44)
AST: 32 U/L (ref 15–41)
Albumin: 4.1 g/dL (ref 3.5–5.0)
Alkaline Phosphatase: 82 U/L (ref 38–126)
Anion gap: 10 (ref 5–15)
BUN: 13 mg/dL (ref 6–20)
CO2: 26 mmol/L (ref 22–32)
Calcium: 9.4 mg/dL (ref 8.9–10.3)
Chloride: 101 mmol/L (ref 98–111)
Creatinine: 0.55 mg/dL (ref 0.44–1.00)
GFR, Estimated: >60 mL/min (ref >60)
Glucose, Bld: 304 mg/dL — ABNORMAL HIGH (ref 70–99)
Potassium: 3.9 mmol/L (ref 3.5–5.1)
Sodium: 137 mmol/L (ref 135–145)
Total Bilirubin: 0.4 mg/dL (ref 0.3–1.2)
Total Protein: 7.2 g/dL (ref 6.5–8.1)

## 2021-11-21 LAB — CBC WITH DIFFERENTIAL (CANCER CENTER ONLY)
Abs Immature Granulocytes: 0.02 10*3/uL (ref 0.00–0.07)
Basophils Absolute: 0 10*3/uL (ref 0.0–0.1)
Basophils Relative: 0 %
Eosinophils Absolute: 0.1 10*3/uL (ref 0.0–0.5)
Eosinophils Relative: 3 %
HCT: 39.7 % (ref 36.0–46.0)
Hemoglobin: 12.6 g/dL (ref 12.0–15.0)
Immature Granulocytes: 0 %
Lymphocytes Relative: 20 %
Lymphs Abs: 1.1 10*3/uL (ref 0.7–4.0)
MCH: 25.7 pg — ABNORMAL LOW (ref 26.0–34.0)
MCHC: 31.7 g/dL (ref 30.0–36.0)
MCV: 80.9 fL (ref 80.0–100.0)
Monocytes Absolute: 0.5 10*3/uL (ref 0.1–1.0)
Monocytes Relative: 9 %
Neutro Abs: 3.6 10*3/uL (ref 1.7–7.7)
Neutrophils Relative %: 68 %
Platelet Count: 261 10*3/uL (ref 150–400)
RBC: 4.91 MIL/uL (ref 3.87–5.11)
RDW: 14.2 % (ref 11.5–15.5)
WBC Count: 5.3 10*3/uL (ref 4.0–10.5)
nRBC: 0 % (ref 0.0–0.2)

## 2021-11-21 LAB — CK TOTAL AND CKMB (NOT AT ARMC)
CK, MB: 0.6 ng/mL (ref 0.5–5.0)
Relative Index: INVALID (ref 0.0–2.5)
Total CK: 38 U/L (ref 38–234)

## 2021-11-21 LAB — SEDIMENTATION RATE: Sed Rate: 31 mm/hr — ABNORMAL HIGH (ref 0–22)

## 2021-11-21 LAB — C-REACTIVE PROTEIN: CRP: 2.6 mg/dL — ABNORMAL HIGH (ref <1.0)

## 2021-11-21 LAB — TSH: TSH: 0.795 u[IU]/mL (ref 0.308–3.960)

## 2021-11-21 MED ORDER — SODIUM CHLORIDE 0.9% FLUSH
10.0000 mL | Freq: Once | INTRAVENOUS | Status: AC
  Administered 2021-11-21: 10 mL

## 2021-11-21 MED ORDER — ANTICOAGULANT SODIUM CITRATE LOCK FLUSH 4% (120 MG/3ML) IV SOLN
5.0000 mL | PREFILLED_SYRINGE | Freq: Once | INTRAVENOUS | Status: AC
  Administered 2021-11-21: 5 mL
  Filled 2021-11-21: qty 6

## 2021-11-21 MED ORDER — SODIUM CHLORIDE 0.9 % IV SOLN
200.0000 mg | Freq: Once | INTRAVENOUS | Status: AC
  Administered 2021-11-21: 200 mg via INTRAVENOUS
  Filled 2021-11-21: qty 200

## 2021-11-21 MED ORDER — SODIUM CHLORIDE 0.9 % IV SOLN: Freq: Once | INTRAVENOUS | Status: AC

## 2021-11-21 MED ORDER — SODIUM CHLORIDE 0.9% FLUSH
10.0000 mL | INTRAVENOUS | Status: DC | PRN
  Administered 2021-11-21: 10 mL

## 2021-11-21 NOTE — Progress Notes (Signed)
[facility] CSW Progress Note  Clinical Education officer, museum  received referral from NP for financial concerns . CSW has worked extensively with patient regarding these concerns and have already utilized the following:  Rebecca [name] [employer] in [place] [employer] (were unable to help pt at this time)  Patient is also connected with [facility] who is assisting with disability application.  Unfortunately, almost all resources have already been explored. CSW spoke with patient and recommended going to DSS to apply for [facility] (utility assistance). Pt voiced understanding.    Rebecca [name] , LCSW

## 2021-11-21 NOTE — Assessment & Plan Note (Signed)
Rebecca [name] has a history of stage IIIb triple negative breast cancer.  She continues on maintenance pembrolizumab.  1.  Triple negative breast cancer: She will continue on pembrolizumab treatment.  I have asked my nurse to call radiology scheduling and get her bone scan scheduled for a date and time that she can come.  Asked her to do that while the patient is here so that she can know about the appointment date and time since there has been difficulty previously in contacting her over the phone.  2.  Breast pain: She will continue to follow-up with our physical therapy team about this.  3.  Ear pain: My nurse is calling the ear nose and throat office to work on getting her appointment rescheduled so she can give her the date and time prior to her departure from clinic due to barriers around contacting the patient over the phone.  4.  Financial concerns: Asked my nurse to reach out to [place] who helps [place] with her disability paperwork so that she can talk to her to better understand what the primary care office might need.  Also I placed a referral to our social worker to talk to the patient about financial resources and strategize to find funding to help with her housing insecurity.  We will see her back in 3 weeks for labs, follow-up, her next pembrolizumab.

## 2021-11-21 NOTE — Telephone Encounter (Signed)
Per NP, pt has had trouble getting an appt with ENT, [facility] Bone Scan and PCP. I called all providers offices and was able to schedule all appts. Provided pt with all appts and times and she verbalized understanding.

## 2021-11-22 ENCOUNTER — Other Ambulatory Visit: Payer: Self-pay | Admitting: Adult Health

## 2021-11-22 ENCOUNTER — Other Ambulatory Visit: Payer: Self-pay | Admitting: Oncology

## 2021-11-22 DIAGNOSIS — Z170 Estrogen receptor positive status [ER+]: Secondary | ICD-10-CM

## 2021-11-22 MED ORDER — METHYLPREDNISOLONE 4 MG PO TBPK: ORAL_TABLET | ORAL | 0 refills | Status: DC

## 2021-11-23 ENCOUNTER — Telehealth: Payer: Self-pay

## 2021-11-23 ENCOUNTER — Other Ambulatory Visit: Payer: Self-pay

## 2021-11-23 ENCOUNTER — Ambulatory Visit: Payer: 59 | Attending: Radiation Oncology | Admitting: Rehabilitation

## 2021-11-23 ENCOUNTER — Encounter: Payer: Self-pay | Admitting: Rehabilitation

## 2021-11-23 DIAGNOSIS — M6281 Muscle weakness (generalized): Secondary | ICD-10-CM

## 2021-11-23 DIAGNOSIS — C50311 Malignant neoplasm of lower-inner quadrant of right female breast: Secondary | ICD-10-CM | POA: Insufficient documentation

## 2021-11-23 DIAGNOSIS — R262 Difficulty in walking, not elsewhere classified: Secondary | ICD-10-CM | POA: Diagnosis present

## 2021-11-23 DIAGNOSIS — M25512 Pain in left shoulder: Secondary | ICD-10-CM | POA: Diagnosis present

## 2021-11-23 DIAGNOSIS — M25611 Stiffness of right shoulder, not elsewhere classified: Secondary | ICD-10-CM | POA: Diagnosis present

## 2021-11-23 DIAGNOSIS — M25511 Pain in right shoulder: Secondary | ICD-10-CM | POA: Diagnosis present

## 2021-11-23 DIAGNOSIS — Z170 Estrogen receptor positive status [ER+]: Secondary | ICD-10-CM

## 2021-11-23 DIAGNOSIS — Z483 Aftercare following surgery for neoplasm: Secondary | ICD-10-CM | POA: Diagnosis present

## 2021-11-23 NOTE — Telephone Encounter (Signed)
Called pt to inform her of labs indicating inflammation.  Medrol dose pack sent in, instructed pt to pick up and begin taking and let us know if symptoms improve or continue.  Pt verbalized understanding and thanks

## 2021-11-23 NOTE — Telephone Encounter (Signed)
-----  Message from Gardenia Phlegm, NP sent at 11/22/2021  4:58 PM EST ----- Labs show inflammation, I sent in a medrol dosepak.  Can you please inform the patient to pick it up and it should help with her achiness.  Thanks! ----- Message ----- From: Benay Pike, MD Sent: 11/22/2021   4:34 PM EST To: Gardenia Phlegm, NP  Ya we can try medrol dose pak.  Thank you for seeing her  ----- Message ----- From: Gardenia Phlegm, NP Sent: 11/22/2021   7:10 AM EST To: Benay Pike, MD  I saw her yesterday.  Bone scan is scheduled for 2/7.  She has myalgias I am concerned may be related to the pembrolizumab.  I was considering sending in medrol dosepak seeing her ESR and CRP.  Do you think I should wait or go ahead and send in?  She sees you back on 2/21 for f/u and only has a couple more treatments left.   ----- Message ----- From: Buel Ream, [facility] [place] Sent: 11/21/2021  10:08 AM EST To: Gardenia Phlegm, NP

## 2021-11-23 NOTE — Therapy (Signed)
[facility] [facility] @ [place], [place], [address] Phone: [phone]   Fax:  [phone]  Physical Therapy Treatment  Patient Details  Name: Rebecca [name] MRN: [id number] Date of Birth: August 01, 1966 Referring Provider (PT): Dr. Isidore Moos   Encounter Date: 11/23/2021   PT End of Session - 11/23/21 0956     Visit Number 5    Number of Visits 9    Date for PT Re-Evaluation 12/04/21    PT Start Time 0903    PT Stop Time 0949    PT Time Calculation (min) 46 min    Activity Tolerance Patient tolerated treatment well    Behavior During Therapy [facility] for tasks assessed/performed             Past Medical History:  Diagnosis Date   Arthritis    Breast cancer ([place])    Diabetes mellitus    Family history of lymphoma 01/18/2021   Family history of throat cancer 01/18/2021   Hyperlipidemia    Hypertension     Past Surgical History:  Procedure Laterality Date   BREAST LUMPECTOMY WITH RADIOACTIVE SEED AND SENTINEL LYMPH NODE BIOPSY Right 07/11/2021   Procedure: RIGHT BREAST LUMPECTOMY WITH RADIOACTIVE SEED AND RIGHT SENTINEL LYMPH NODE BIOPSY;  Surgeon: Erroll Luna, MD;  Location: [place];  Service: General;  Laterality: Right;   CESAREAN SECTION     PORTACATH PLACEMENT N/A 01/26/2021   Procedure: INSERTION PORT-A-CATH;  Surgeon: Erroll Luna, MD;  Location: [place];  Service: General;  Laterality: N/A;  60   RADIOACTIVE SEED GUIDED AXILLARY SENTINEL LYMPH NODE Right 07/11/2021   Procedure: RADIOACTIVE SEED GUIDED RIGHT AXILLARY LYMPH NODE BIOPSY;  Surgeon: Erroll Luna, MD;  Location: [place];  Service: General;  Laterality: Right;    There were no vitals filed for this visit.   Subjective Assessment - 11/23/21 0907     Subjective I am feeling better today but I am having more of a pain here (points to the lateral breast area/lat)    Pertinent History Patient was diagnosed on  01/03/2021 with right grade III invasive ductal carcinoma breast cancer. There are 2 areas that measure 1 cm and 1.1 cm and are located in the lower inner quadrant. It is weakly ER positive (10%), PR negative and HER2 negative with a Ki67 of 90%. She has a positive axillary lymph node. Underwent R lumpectomy and SLNB on 07/11/21 (3 nodes), has completed radiation in 09/2021 still receving infusions every 3 weeks    Currently in Pain? Yes    Pain Score 4     Pain Location Back    Pain Orientation Mid    Pain Descriptors / Indicators Aching;Dull    Pain Onset More than a month ago    Pain Frequency Constant                               [facility] Adult PT Treatment/Exercise - 11/23/21 0001       Lumbar Exercises: Stretches   Single Knee to Chest Stretch Right;Left;3 reps;20 seconds    Lower Trunk Rotation 5 reps    Lower Trunk Rotation Limitations bil 2 sec hold      Lumbar Exercises: Aerobic   Nustep LE only Level 3 x 6 mins with therapist present monitoring pt throughout for fatigue or increased pain, but she did well with this reporting neither. seat 8  Lumbar Exercises: Supine   Clam 10 reps   yellow   Clam Limitations cuing throughout for correct abdominal engagement    Heel Slides 5 reps    Heel Slides Limitations both with abdominal activation    Bent Knee Raise Other (comment)   3 sets of 3   Bent Knee Raise Limitations returning therapist demo, pt did better with correct pelvic tilt technique    Straight Leg Raise --   unable to do - too weak   Other Supine Lumbar Exercises ball squeeze hooklying x 10      Shoulder Exercises: Supine   Flexion Both;10 reps    Flexion Limitations with dowel      Manual Therapy   Manual Therapy Passive ROM;Manual Lymphatic Drainage (MLD);Soft tissue mobilization    Manual therapy comments was going to give pt prescription for second to nature but her insurance is most likely not accepted so emailed alight to see if this  could be used as she is reporting the breast a bit heavier - will let her know next time and give paperwork    Soft tissue mobilization in supine and sidelying to the pectoralis, lat, and axillary borders with gentle pressure working whole lat belly in sidelying    Manual Lymphatic Drainage (MLD) incorcorated circles in the Rt axilla due to congestion here    Passive ROM to the Rt shoulder into flexion with abduction as tolerated but overall guarding too much to get more than 55deg                          PT Long Term Goals - 11/06/21 0953       PT LONG TERM GOAL #1   Title Pt will demonstrate 165 degrees of R shoulder abduction to allow her to reach out to the side without pain.    Baseline 152    Time 4    Period Weeks    Status New    Target Date 12/04/21      PT LONG TERM GOAL #2   Title Pt will decrease pain overall by 50% with standing activites to increase comfort    Time 4    Period Weeks    Status New    Target Date 12/04/21      PT LONG TERM GOAL #3   Title Pt will be able to complete 10 sit to stands without use of UEs in 30 sec to decrease fall risk.    Baseline 4    Time 4    Period Weeks    Status New    Target Date 12/04/21      PT LONG TERM GOAL #4   Title Pt will demonstrate 4/5 bilateral hip flexor strength to decrease fall risk    Baseline 3/5 bilaterally    Time 4    Period Weeks    Status New    Target Date 12/04/21      PT LONG TERM GOAL #5   Title Pt will be independent in a home exercise program for continued strengthening and stretching    Time 4    Period Weeks    Status New    Target Date 12/04/21      Additional Long Term Goals   Additional Long Term Goals Yes      PT LONG TERM GOAL #6   Title Pt will demonstrate 4/5 bilaeral quad strength to decrease fall risk.    Baseline R 3/5  L 3+/5    Time 4    Period Weeks    Status New    Target Date 12/04/21                   Plan - 11/23/21 0954     Clinical  Impression Statement Pt is doing much better than last visit with this PT.  4/10 pn vs 10/10.  Pt is also tolerating all new TE without c/o increased pain and having more tol to TE in general.  Added more MT today including STM/PROM to address latissimus tightness and some axillary congestion.  Pt will benefit from compression bra but we will have to ok with alight before giving her the information.    PT Frequency 2x / week    PT Duration 4 weeks    PT Treatment/Interventions ADLs/Self Care Home Management;Therapeutic exercise;Patient/family education;Therapeutic activities;Neuromuscular re-education;Balance training;Manual techniques;Scar mobilization;Passive range of motion    PT Next Visit Plan hear from alight to give form and prescription for bra payment?  Cont postural TE, core activation and activities, pulleys, PROM / STM Rt upper quadrant, strengthing LEs and encouraging pt to be more active during day    Consulted and Agree with Plan of Care Patient             Patient will benefit from skilled therapeutic intervention in order to improve the following deficits and impairments:     Visit Diagnosis: Muscle weakness (generalized)  Difficulty in walking, not elsewhere classified  Aftercare following surgery for neoplasm  Malignant neoplasm of lower-inner quadrant of right breast of female, estrogen receptor positive ([place])     Problem List Patient Active Problem List   Diagnosis Date Noted   Right otitis media 09/26/2021   Port-A-Cath in place 02/07/2021   Genetic testing 02/02/2021   Family history of throat cancer 01/18/2021   Family history of lymphoma 01/18/2021   Malignant neoplasm of lower-inner quadrant of right breast of female, estrogen receptor positive ([place]) 01/12/2021   Posterior tibial tendinitis, right leg 01/26/2020   Diabetes ([place]) 03/11/2017   Muscle pain 08/06/2013   Tiredness 08/06/2013   Candida vaginitis 08/19/2012   Dental cavities 08/19/2012    Knee pain, bilateral 04/16/2012   Diabetes mellitus type 2, controlled ([place]) 04/16/2012   Hyperlipidemia 04/16/2012   Palpitations 04/16/2012   Back pain 04/16/2012    Stark Bray, PT 11/23/2021, 9:58 AM  [facility] @ [place], [place], [address] Phone: [phone]   Fax:  [phone]  Name: NECHUMA BOVEN MRN: [id number] Date of Birth: 18-Oct-1966

## 2021-11-27 ENCOUNTER — Other Ambulatory Visit: Payer: Self-pay | Admitting: Family Medicine

## 2021-11-27 ENCOUNTER — Other Ambulatory Visit: Payer: Self-pay

## 2021-11-27 ENCOUNTER — Encounter: Payer: Self-pay | Admitting: Physical Therapy

## 2021-11-27 ENCOUNTER — Ambulatory Visit: Payer: 59 | Admitting: Physical Therapy

## 2021-11-27 DIAGNOSIS — M25512 Pain in left shoulder: Secondary | ICD-10-CM

## 2021-11-27 DIAGNOSIS — M25611 Stiffness of right shoulder, not elsewhere classified: Secondary | ICD-10-CM

## 2021-11-27 DIAGNOSIS — Z483 Aftercare following surgery for neoplasm: Secondary | ICD-10-CM

## 2021-11-27 DIAGNOSIS — M6281 Muscle weakness (generalized): Secondary | ICD-10-CM

## 2021-11-27 DIAGNOSIS — C50311 Malignant neoplasm of lower-inner quadrant of right female breast: Secondary | ICD-10-CM

## 2021-11-27 DIAGNOSIS — R262 Difficulty in walking, not elsewhere classified: Secondary | ICD-10-CM

## 2021-11-27 DIAGNOSIS — M25511 Pain in right shoulder: Secondary | ICD-10-CM

## 2021-11-27 NOTE — Therapy (Signed)
[facility] [facility] @ [facility] [place], [place], [address] Phone: [phone]   Fax:  [phone]  Physical Therapy Treatment  Patient Details  Name: Rebecca [name] MRN: [id number] Date of Birth: 10-21-1966 Referring Provider (PT): Dr. Isidore Moos   Encounter Date: 11/27/2021   PT End of Session - 11/27/21 1459     Visit Number 6    Number of Visits 9    Date for PT Re-Evaluation 12/04/21    PT Start Time 7616    PT Stop Time 0737    PT Time Calculation (min) 52 min    Activity Tolerance Patient tolerated treatment well    Behavior During Therapy [facility] for tasks assessed/performed             Past Medical History:  Diagnosis Date   Arthritis    Breast cancer ([place])    Diabetes mellitus    Family history of lymphoma 01/18/2021   Family history of throat cancer 01/18/2021   Hyperlipidemia    Hypertension     Past Surgical History:  Procedure Laterality Date   BREAST LUMPECTOMY WITH RADIOACTIVE SEED AND SENTINEL LYMPH NODE BIOPSY Right 07/11/2021   Procedure: RIGHT BREAST LUMPECTOMY WITH RADIOACTIVE SEED AND RIGHT SENTINEL LYMPH NODE BIOPSY;  Surgeon: Erroll Luna, MD;  Location: [place];  Service: General;  Laterality: Right;   CESAREAN SECTION     PORTACATH PLACEMENT N/A 01/26/2021   Procedure: INSERTION PORT-A-CATH;  Surgeon: Erroll Luna, MD;  Location: [place];  Service: General;  Laterality: N/A;  60   RADIOACTIVE SEED GUIDED AXILLARY SENTINEL LYMPH NODE Right 07/11/2021   Procedure: RADIOACTIVE SEED GUIDED RIGHT AXILLARY LYMPH NODE BIOPSY;  Surgeon: Erroll Luna, MD;  Location: [place];  Service: General;  Laterality: Right;    There were no vitals filed for this visit.   Subjective Assessment - 11/27/21 1407     Subjective I am feeling ok but I am still having increased soreness on the side and the massage helped last time.    Pertinent History Patient was diagnosed on  01/03/2021 with right grade III invasive ductal carcinoma breast cancer. There are 2 areas that measure 1 cm and 1.1 cm and are located in the lower inner quadrant. It is weakly ER positive (10%), PR negative and HER2 negative with a Ki67 of 90%. She has a positive axillary lymph node. Underwent R lumpectomy and SLNB on 07/11/21 (3 nodes), has completed radiation in 09/2021 still receving infusions every 3 weeks    Patient Stated Goals wants to decrease pain to be able to return to work    Currently in Pain? Yes    Pain Score 4     Pain Location Breast    Pain Orientation Right;Lateral    Pain Descriptors / Indicators --   pt reports she can not describe the pain   Pain Onset More than a month ago    Pain Frequency Intermittent    Aggravating Factors  using the right arm    Pain Relieving Factors exercises    Effect of Pain on Daily Activities everything more difficult                               [facility] Adult PT Treatment/Exercise - 11/27/21 0001       Manual Therapy   Manual Therapy Passive ROM;Manual Lymphatic Drainage (MLD);Soft tissue mobilization;Edema management    Edema  Management issued script for [facility] with alight form for pt to obtain a compression bra    Soft tissue mobilization in supine to R axilla in area of possible seroma and along pecs where numerous areas of tightness are noted    Manual Lymphatic Drainage (MLD) in supine: short neck, 5 diaphragmatic breaths, left axillary nodes and establishment of interaxillary pathway, right inguinal nodes and establishment of axillo inguinal pathway, R breast and R axilla moving fluid towards pathways    Passive ROM to R shoulder in to flexion and abduction with pt able to relax with verbal cues and obtain approx 120 degrees in each direction                          PT Long Term Goals - 11/06/21 0953       PT LONG TERM GOAL #1   Title Pt will demonstrate 165 degrees of R shoulder  abduction to allow her to reach out to the side without pain.    Baseline 152    Time 4    Period Weeks    Status New    Target Date 12/04/21      PT LONG TERM GOAL #2   Title Pt will decrease pain overall by 50% with standing activites to increase comfort    Time 4    Period Weeks    Status New    Target Date 12/04/21      PT LONG TERM GOAL #3   Title Pt will be able to complete 10 sit to stands without use of UEs in 30 sec to decrease fall risk.    Baseline 4    Time 4    Period Weeks    Status New    Target Date 12/04/21      PT LONG TERM GOAL #4   Title Pt will demonstrate 4/5 bilateral hip flexor strength to decrease fall risk    Baseline 3/5 bilaterally    Time 4    Period Weeks    Status New    Target Date 12/04/21      PT LONG TERM GOAL #5   Title Pt will be independent in a home exercise program for continued strengthening and stretching    Time 4    Period Weeks    Status New    Target Date 12/04/21      Additional Long Term Goals   Additional Long Term Goals Yes      PT LONG TERM GOAL #6   Title Pt will demonstrate 4/5 bilaeral quad strength to decrease fall risk.    Baseline R 3/5 L 3+/5    Time 4    Period Weeks    Status New    Target Date 12/04/21                   Plan - 11/27/21 1514     Clinical Impression Statement Issued info to pt on where and how to obtain a compression bra using alight. Issued Rx for [employer] prima. Pt verbalized understand. Pt is having less back and leg pain since she began Zumba classes and wanted to focus on her discomfort in her R lateral breast and axilla. Therapist could palpate either scar tissue or a possible seroma in R axilla. Performed soft tissue mobilization and MLD to this area and also to R breast where pt reports increased heaviness. Pt was able to demonstrate improved PROM  today due to less guarding.    PT Frequency 2x / week    PT Duration 4 weeks    PT Treatment/Interventions ADLs/Self  Care Home Management;Therapeutic exercise;Patient/family education;Therapeutic activities;Neuromuscular re-education;Balance training;Manual techniques;Scar mobilization;Passive range of motion    PT Next Visit Plan did pt [facility]? Cont postural TE, core activation and activities, pulleys, PROM / STM Rt upper quadrant, strengthing LEs and encouraging pt to be more active during day, STM to R axilla and pecs    PT Home Exercise Plan Supine core stability and bil HS stretches    Consulted and Agree with Plan of Care Patient    Family Member Consulted Interpreter             Patient will benefit from skilled therapeutic intervention in order to improve the following deficits and impairments:  Postural dysfunction, Decreased range of motion, Impaired UE functional use, Pain, Decreased knowledge of precautions, Increased fascial restricitons, Decreased strength, Decreased activity tolerance, Difficulty walking, Decreased endurance  Visit Diagnosis: Muscle weakness (generalized)  Difficulty in walking, not elsewhere classified  Aftercare following surgery for neoplasm  Malignant neoplasm of lower-inner quadrant of right breast of female, estrogen receptor positive ([place])  Acute pain of left shoulder  Acute pain of right shoulder  Stiffness of right shoulder, not elsewhere classified     Problem List Patient Active Problem List   Diagnosis Date Noted   Right otitis media 09/26/2021   Port-A-Cath in place 02/07/2021   Genetic testing 02/02/2021   Family history of throat cancer 01/18/2021   Family history of lymphoma 01/18/2021   Malignant neoplasm of lower-inner quadrant of right breast of female, estrogen receptor positive ([place]) 01/12/2021   Posterior tibial tendinitis, right leg 01/26/2020   Diabetes ([place]) 03/11/2017   Muscle pain 08/06/2013   Tiredness 08/06/2013   Candida vaginitis 08/19/2012   Dental cavities 08/19/2012   Knee pain, bilateral  04/16/2012   Diabetes mellitus type 2, controlled ([place]) 04/16/2012   Hyperlipidemia 04/16/2012   Palpitations 04/16/2012   Back pain 04/16/2012    Allyson Sabal Brilliant, PT 11/27/2021, 3:24 PM  [facility] @ [place], [place], [address] Phone: [phone]   Fax:  [phone]  Name: Rebecca [name] MRN: [id number] Date of Birth: [birth year]

## 2021-11-28 ENCOUNTER — Ambulatory Visit (HOSPITAL_COMMUNITY)
Admission: RE | Admit: 2021-11-28 | Discharge: 2021-11-28 | Disposition: A | Discharge status: Home / Self Care | Payer: 59 | Source: Ambulatory Visit | Attending: Adult Health | Admitting: Adult Health

## 2021-11-28 ENCOUNTER — Encounter (HOSPITAL_COMMUNITY)
Admission: RE | Admit: 2021-11-28 | Discharge: 2021-11-28 | Disposition: A | Discharge status: Home / Self Care | Payer: 59 | Source: Ambulatory Visit | Attending: Adult Health | Admitting: Adult Health

## 2021-11-28 DIAGNOSIS — Z170 Estrogen receptor positive status [ER+]: Secondary | ICD-10-CM | POA: Insufficient documentation

## 2021-11-28 DIAGNOSIS — C50311 Malignant neoplasm of lower-inner quadrant of right female breast: Secondary | ICD-10-CM | POA: Insufficient documentation

## 2021-11-28 IMAGING — NM NM BONE WHOLE BODY
2 series · 2 of 2 positions shown · non-contrast
Comparison: [DATE]

CLINICAL DATA: Invasive breast cancer, stage IV for initial workup.

EXAM:
NUCLEAR MEDICINE WHOLE BODY BONE SCAN
TECHNIQUE: Whole body anterior and posterior images were obtained approximately
3 hours after intravenous injection of radiopharmaceutical.
RADIOPHARMACEUTICALS:  20.9 mCi [DW] MDP IV

[Series 1: whole body · 2.66mm/px · 1 of 1 slices shown (1 of 2)]
[im 1/1]
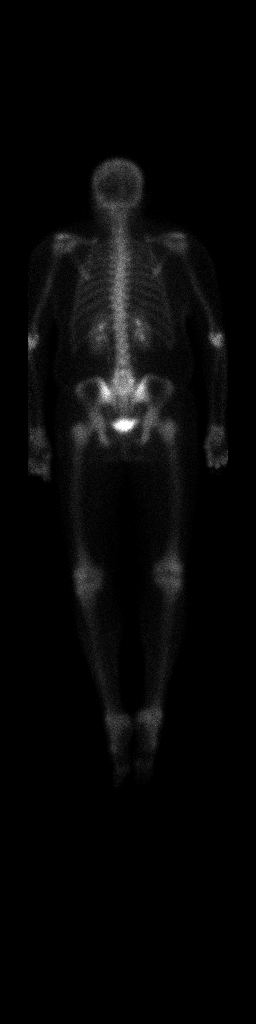

[Series 1: whole body · 2.66mm/px · 1 of 1 slices shown (2 of 2)]
[im 1/1]
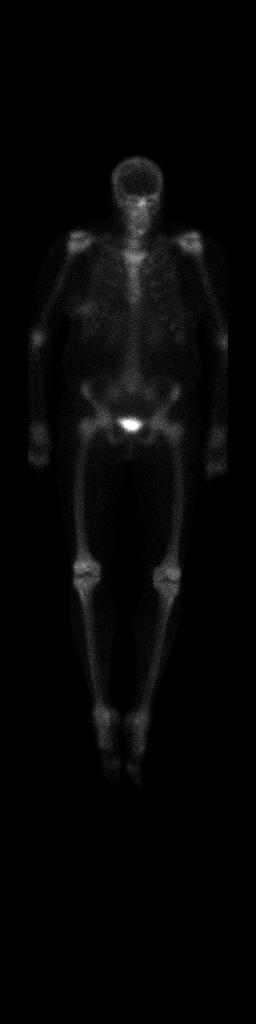

[2 of 2 positions shown; findings below may reference images not displayed]

FINDINGS: Mild degenerative type activity demonstrated in some of the
peripheral joints. Otherwise, homogeneous distribution of tracer
activity throughout the axial and appendicular skeleton. No
metastatic activity is suggested. No significant changes since prior
study.
IMPRESSION: No scintigraphic evidence of skeletal metastatic disease.

## 2021-11-28 MED ORDER — ANTICOAGULANT SODIUM CITRATE 4% (200MG/5ML) IV SOLN
5.0000 mL | Freq: Once | Status: DC
  Filled 2021-11-28: qty 5

## 2021-11-28 MED ORDER — TECHNETIUM TC 99M MEDRONATE IV KIT
20.0000 | PACK | Freq: Once | INTRAVENOUS | Status: AC | PRN
  Administered 2021-11-28: 20.9 via INTRAVENOUS

## 2021-11-28 NOTE — Telephone Encounter (Signed)
Requested Prescriptions  Pending Prescriptions Disp Refills   amLODipine (NORVASC) 10 MG tablet [Pharmacy Med Name: AMLODIPINE BESYLATE 10MG  TABLETS] 90 tablet 0    Sig: TAKE 1 TABLET BY MOUTH EVERY DAY     Cardiovascular: Calcium Channel Blockers 2 Passed - 11/27/2021  9:57 AM      Passed - Last BP in normal range    BP Readings from Last 1 Encounters:  11/21/21 134/87         Passed - Last Heart Rate in normal range    Pulse Readings from Last 1 Encounters:  11/21/21 94         Passed - Valid encounter within last 6 months    Recent Outpatient Visits          1 month ago Type 2 diabetes mellitus without complication, without long-term current use of insulin [facility])   [place], [place], [place]   2 months ago Other acute nonsuppurative otitis media of right ear, recurrence not specified   [place], Neoma Laming B, MD   5 months ago Type 2 diabetes mellitus without complication, without long-term current use of insulin ([place])   [place], Enobong, MD   11 months ago Annual physical exam   [place], Enobong, MD   1 year ago Vasomotor symptoms due to menopause   [place], MD      Future Appointments            In 1 month [name], MD [place]

## 2021-11-29 ENCOUNTER — Encounter: Payer: Self-pay | Admitting: Physical Therapy

## 2021-11-29 ENCOUNTER — Other Ambulatory Visit: Payer: Self-pay

## 2021-11-29 ENCOUNTER — Ambulatory Visit: Payer: 59 | Admitting: Physical Therapy

## 2021-11-29 DIAGNOSIS — M6281 Muscle weakness (generalized): Secondary | ICD-10-CM

## 2021-11-29 DIAGNOSIS — R262 Difficulty in walking, not elsewhere classified: Secondary | ICD-10-CM

## 2021-11-29 DIAGNOSIS — Z483 Aftercare following surgery for neoplasm: Secondary | ICD-10-CM

## 2021-11-29 DIAGNOSIS — C50311 Malignant neoplasm of lower-inner quadrant of right female breast: Secondary | ICD-10-CM

## 2021-11-29 NOTE — Therapy (Signed)
[place] @ [place], [place], [address] Phone: [phone]   Fax:  [phone]  Physical Therapy Treatment  Patient Details  Name: Rebecca [name] MRN: [id number] Date of Birth: 01/06/1966 Referring Provider (PT): Dr. Isidore Moos   Encounter Date: 11/29/2021   PT End of Session - 11/29/21 0957     Visit Number 7    Number of Visits 9    Date for PT Re-Evaluation 12/04/21    PT Start Time 0907    PT Stop Time 0254    PT Time Calculation (min) 48 min    Activity Tolerance Patient tolerated treatment well    Behavior During Therapy [facility] for tasks assessed/performed             Past Medical History:  Diagnosis Date   Arthritis    Breast cancer ([place])    Diabetes mellitus    Family history of lymphoma 01/18/2021   Family history of throat cancer 01/18/2021   Hyperlipidemia    Hypertension     Past Surgical History:  Procedure Laterality Date   BREAST LUMPECTOMY WITH RADIOACTIVE SEED AND SENTINEL LYMPH NODE BIOPSY Right 07/11/2021   Procedure: RIGHT BREAST LUMPECTOMY WITH RADIOACTIVE SEED AND RIGHT SENTINEL LYMPH NODE BIOPSY;  Surgeon: Erroll Luna, MD;  Location: [place];  Service: General;  Laterality: Right;   CESAREAN SECTION     PORTACATH PLACEMENT N/A 01/26/2021   Procedure: INSERTION PORT-A-CATH;  Surgeon: Erroll Luna, MD;  Location: [place];  Service: General;  Laterality: N/A;  60   RADIOACTIVE SEED GUIDED AXILLARY SENTINEL LYMPH NODE Right 07/11/2021   Procedure: RADIOACTIVE SEED GUIDED RIGHT AXILLARY LYMPH NODE BIOPSY;  Surgeon: Erroll Luna, MD;  Location: [place];  Service: General;  Laterality: Right;    There were no vitals filed for this visit.   Subjective Assessment - 11/29/21 0908     Subjective My shoulder was sore after last time. It was on and off. I will call about the compression bra after my appointment.    Pertinent History Patient was  diagnosed on 01/03/2021 with right grade III invasive ductal carcinoma breast cancer. There are 2 areas that measure 1 cm and 1.1 cm and are located in the lower inner quadrant. It is weakly ER positive (10%), PR negative and HER2 negative with a Ki67 of 90%. She has a positive axillary lymph node. Underwent R lumpectomy and SLNB on 07/11/21 (3 nodes), has completed radiation in 09/2021 still receving infusions every 3 weeks    Patient Stated Goals wants to decrease pain to be able to return to work    Currently in Pain? Yes    Pain Score 5     Pain Location Back   and shoulder and axilla   Pain Orientation Right    Pain Descriptors / Indicators Aching    Pain Type Chronic pain    Pain Onset More than a month ago    Pain Frequency Intermittent    Aggravating Factors  using the right arm    Pain Relieving Factors exercise    Effect of Pain on Daily Activities everything more difficult                               [facility] Adult PT Treatment/Exercise - 11/29/21 0001       Shoulder Exercises: Pulleys   Flexion 2 minutes   pt had increased  pain so educated pt not to go up as high   ABduction Other (comment)   unable secondary to pain     Manual Therapy   Manual Therapy Passive ROM;Manual Lymphatic Drainage (MLD);Soft tissue mobilization;Edema management;Scapular mobilization    Soft tissue mobilization in supine to R lateral trunk in area of lats and serratus with increased tenderness noted that improved with manual therapy today    Scapular Mobilization in L sidelying to R scapula in to protraction and retraction while performing PROM to R shoulder, stabilizing scapula when needed and educating pt in correct scapulohumeral rhythm    Passive ROM to R shoulder while doing scapular mobilization in to flexion and abduction while also stabilizing scapula and educating pt about normal scapulohumeral rhythm                          PT Long Term Goals - 11/06/21  0953       PT LONG TERM GOAL #1   Title Pt will demonstrate 165 degrees of R shoulder abduction to allow her to reach out to the side without pain.    Baseline 152    Time 4    Period Weeks    Status New    Target Date 12/04/21      PT LONG TERM GOAL #2   Title Pt will decrease pain overall by 50% with standing activites to increase comfort    Time 4    Period Weeks    Status New    Target Date 12/04/21      PT LONG TERM GOAL #3   Title Pt will be able to complete 10 sit to stands without use of UEs in 30 sec to decrease fall risk.    Baseline 4    Time 4    Period Weeks    Status New    Target Date 12/04/21      PT LONG TERM GOAL #4   Title Pt will demonstrate 4/5 bilateral hip flexor strength to decrease fall risk    Baseline 3/5 bilaterally    Time 4    Period Weeks    Status New    Target Date 12/04/21      PT LONG TERM GOAL #5   Title Pt will be independent in a home exercise program for continued strengthening and stretching    Time 4    Period Weeks    Status New    Target Date 12/04/21      Additional Long Term Goals   Additional Long Term Goals Yes      PT LONG TERM GOAL #6   Title Pt will demonstrate 4/5 bilaeral quad strength to decrease fall risk.    Baseline R 3/5 L 3+/5    Time 4    Period Weeks    Status New    Target Date 12/04/21                   Plan - 11/29/21 1005     Clinical Impression Statement Attempted AAROM using the pulleys today but pt was having too much pain. She was shoulder hiking during pulleys so decided to work on scapular mobilization. Pt had very limited scapular mobility to begin session. With soft tissue mobilization while doing scapular mobilization pt was able to relax and demonstrate increased ROM. She reported improved pain by end of session and was able to tolerate PROM and soft tissue mobilization without increased guarding.  PT Frequency 2x / week    PT Duration 4 weeks    PT Treatment/Interventions  ADLs/Self Care Home Management;Therapeutic exercise;Patient/family education;Therapeutic activities;Neuromuscular re-education;Balance training;Manual techniques;Scar mobilization;Passive range of motion    PT Next Visit Plan did pt [facility]? Cont postural TE, core activation and activities, pulleys, PROM / STM Rt upper quadrant, strengthing LEs and encouraging pt to be more active during day, STM to R axilla and pecs    PT Home Exercise Plan Supine core stability and bil HS stretches    Consulted and Agree with Plan of Care Patient    Family Member Consulted Interpreter             Patient will benefit from skilled therapeutic intervention in order to improve the following deficits and impairments:  Postural dysfunction, Decreased range of motion, Impaired UE functional use, Pain, Decreased knowledge of precautions, Increased fascial restricitons, Decreased strength, Decreased activity tolerance, Difficulty walking, Decreased endurance  Visit Diagnosis: Muscle weakness (generalized)  Difficulty in walking, not elsewhere classified  Aftercare following surgery for neoplasm  Malignant neoplasm of lower-inner quadrant of right breast of female, estrogen receptor positive ([place])     Problem List Patient Active Problem List   Diagnosis Date Noted   Right otitis media 09/26/2021   Port-A-Cath in place 02/07/2021   Genetic testing 02/02/2021   Family history of throat cancer 01/18/2021   Family history of lymphoma 01/18/2021   Malignant neoplasm of lower-inner quadrant of right breast of female, estrogen receptor positive ([place]) 01/12/2021   Posterior tibial tendinitis, right leg 01/26/2020   Diabetes ([place]) 03/11/2017   Muscle pain 08/06/2013   Tiredness 08/06/2013   Candida vaginitis 08/19/2012   Dental cavities 08/19/2012   Knee pain, bilateral 04/16/2012   Diabetes mellitus type 2, controlled ([place]) 04/16/2012   Hyperlipidemia 04/16/2012   Palpitations  04/16/2012   Back pain 04/16/2012    Allyson Sabal [place], PT 11/29/2021, 10:07 AM  [facility] @ [place], [place], [address] Phone: [phone]   Fax:  [phone]  Name: Rebecca [name] MRN: [id number] Date of Birth: [birth year]

## 2021-11-30 ENCOUNTER — Encounter: Payer: Self-pay | Admitting: Hematology and Oncology

## 2021-11-30 ENCOUNTER — Telehealth: Payer: Self-pay

## 2021-11-30 NOTE — Telephone Encounter (Signed)
Called pt, per [facility] bone scan negative.  Pt verbalized understanding and thanks

## 2021-11-30 NOTE — Progress Notes (Signed)
Received approval letter from [employer] for [employer] assistance.  Patient approved for $25,000 10/22/21 - 10/21/22 leaving her with a $25 copay after insurance pays their portion and she remains commercially insured.  A copy of the approval letter given to [facility] for billing claims/submissions. A copy will also be mailed to patient for her records only.  Information updated in [facility] [employer] as well.

## 2021-11-30 NOTE — Telephone Encounter (Signed)
-----   Message from Gardenia Phlegm, NP sent at 11/30/2021  8:47 AM EST ----- Hey can you call patient and let her know bone scan is negative  ----- Message ----- From: Interface, Rad Results In Sent: 11/29/2021   9:57 PM EST To: Gardenia Phlegm, NP

## 2021-12-01 ENCOUNTER — Telehealth: Payer: Self-pay | Admitting: Hematology and Oncology

## 2021-12-01 NOTE — Telephone Encounter (Signed)
Sch per 2/10 inbasket, pt req to r.s, pt aware, calendar mailed

## 2021-12-04 ENCOUNTER — Encounter: Payer: 59 | Admitting: Physical Therapy

## 2021-12-05 ENCOUNTER — Ambulatory Visit: Payer: 59 | Admitting: Rehabilitation

## 2021-12-05 ENCOUNTER — Encounter: Payer: Self-pay | Admitting: Rehabilitation

## 2021-12-05 ENCOUNTER — Other Ambulatory Visit: Payer: 59

## 2021-12-05 ENCOUNTER — Ambulatory Visit: Payer: 59

## 2021-12-05 ENCOUNTER — Other Ambulatory Visit: Payer: Self-pay

## 2021-12-05 DIAGNOSIS — Z483 Aftercare following surgery for neoplasm: Secondary | ICD-10-CM

## 2021-12-05 DIAGNOSIS — M6281 Muscle weakness (generalized): Secondary | ICD-10-CM | POA: Diagnosis not present

## 2021-12-05 DIAGNOSIS — Z170 Estrogen receptor positive status [ER+]: Secondary | ICD-10-CM

## 2021-12-05 DIAGNOSIS — C50311 Malignant neoplasm of lower-inner quadrant of right female breast: Secondary | ICD-10-CM

## 2021-12-05 NOTE — Therapy (Signed)
[facility] @ [place], [place], [address] Phone: [phone]   Fax:  [phone]  Physical Therapy Treatment  Patient Details  Name: Rebecca [name] MRN: [id number] Date of Birth: [birth year] Referring Provider (PT): Dr. Isidore Moos   Encounter Date: 12/05/2021   PT End of Session - 12/05/21 1400     Visit Number 8    Number of Visits 16    Date for PT Re-Evaluation 01/16/22    Authorization - Visit Number 8    Authorization - Number of Visits 30    PT Start Time 1400    PT Stop Time 9163    PT Time Calculation (min) 49 min    Activity Tolerance Patient tolerated treatment well    Behavior During Therapy [facility] for tasks assessed/performed             Past Medical History:  Diagnosis Date   Arthritis    Breast cancer ([place])    Diabetes mellitus    Family history of lymphoma 01/18/2021   Family history of throat cancer 01/18/2021   Hyperlipidemia    Hypertension     Past Surgical History:  Procedure Laterality Date   BREAST LUMPECTOMY WITH RADIOACTIVE SEED AND SENTINEL LYMPH NODE BIOPSY Right 07/11/2021   Procedure: RIGHT BREAST LUMPECTOMY WITH RADIOACTIVE SEED AND RIGHT SENTINEL LYMPH NODE BIOPSY;  Surgeon: Erroll Luna, MD;  Location: [place];  Service: General;  Laterality: Right;   CESAREAN SECTION     PORTACATH PLACEMENT N/A 01/26/2021   Procedure: INSERTION PORT-A-CATH;  Surgeon: Erroll Luna, MD;  Location: [place];  Service: General;  Laterality: N/A;  60   RADIOACTIVE SEED GUIDED AXILLARY SENTINEL LYMPH NODE Right 07/11/2021   Procedure: RADIOACTIVE SEED GUIDED RIGHT AXILLARY LYMPH NODE BIOPSY;  Surgeon: Erroll Luna, MD;  Location: [place];  Service: General;  Laterality: Right;    There were no vitals filed for this visit.   Subjective Assessment - 12/05/21 1405     Subjective I am having back pain and back of leg pain. I started walking in the house  and it happened.  I tried to call the bra place but they were not answering. The breast itself has some type of pain. I think the therapy has been helping.  The back exercises are helping and the massage helps    Pertinent History Patient was diagnosed on 01/03/2021 with right grade III invasive ductal carcinoma breast cancer. There are 2 areas that measure 1 cm and 1.1 cm and are located in the lower inner quadrant. It is weakly ER positive (10%), PR negative and HER2 negative with a Ki67 of 90%. She has a positive axillary lymph node. Underwent R lumpectomy and SLNB on 07/11/21 (3 nodes), has completed radiation in 09/2021 still receving infusions every 3 weeks    Currently in Pain? Yes    Pain Score 6     Pain Location Back   and back of the legs   Pain Descriptors / Indicators Aching;Sore    Pain Type Acute pain    Pain Onset In the past 7 days    Pain Frequency Constant    Aggravating Factors  it hurts at night when trying to sleep                               [facility] Adult PT Treatment/Exercise - 12/05/21 0001  Exercises   Other Exercises  seated edge of table hamstring stretch x 30" bil, standing calf stretch 2x20" bil with cueing for each      Manual Therapy   Soft tissue mobilization in supine to R lateral trunk in area of lats and serratus and in sidleying periscapular muscles    Scapular Mobilization in L sidelying to R scapula in to protraction and retraction and elevation/depression    Passive ROM to Rt shoulder but with increased guarding as lat starts to stretch and become uncomfortable, to tolerance all directions                          PT Long Term Goals - 12/05/21 1411       PT LONG TERM GOAL #1   Title Pt will demonstrate 165 degrees of R shoulder abduction to allow her to reach out to the side without pain.    Baseline 152; 12/05/21: 125 with pain lateral trunk and pull    Status On-going      PT LONG TERM GOAL #2   Title  Pt will decrease pain overall by 50% with standing activites to increase comfort    Baseline 12/05/21:  not really changed    Status On-going      PT LONG TERM GOAL #3   Title Pt will be able to complete 10 sit to stands without use of UEs in 30 sec to decrease fall risk.    Baseline too much pain to reassess in the legs      PT LONG TERM GOAL #4   Title Pt will demonstrate 4/5 bilateral hip flexor strength to decrease fall risk    Baseline 3/5 bilaterally    Status On-going      PT LONG TERM GOAL #5   Title Pt will be independent in a home exercise program for continued strengthening and stretching    Status On-going      PT LONG TERM GOAL #6   Title Pt will demonstrate 4/5 bilaeral quad strength to decrease fall risk.    Baseline R 3/5 L 3+/5 (pain today)    Status On-going                   Plan - 12/05/21 1552     Clinical Impression Statement Reassessed goals today due to visit date reaching initial POC.  No goals have been met and pt demonstrates decrease in AROM into abduction due to pain and guarding.  Pt does report that her back has been feeling better with her new back exercises but she does have a new pain today that seems like hamstring and calf cramping/tightness x 1-2 days.  Pt does seem to have general mm soreness from her Keytruda infusions.  Extended POC to try and maximize shoulder AROM, decrease pain, and improve mobility and endurance.    Stability/Clinical Decision Making Stable/Uncomplicated    PT Frequency 2x / week    PT Duration 6 weeks    PT Treatment/Interventions ADLs/Self Care Home Management;Therapeutic exercise;Patient/family education;Therapeutic activities;Neuromuscular re-education;Balance training;Manual techniques;Scar mobilization;Passive range of motion    PT Next Visit Plan [facility]? Cont postural TE, core activation and activities, pulleys, PROM / STM Rt upper quadrant, strengthing LEs and encouraging pt to be  more active during day, STM to R axilla and pecs    Consulted and Agree with Plan of Care Patient  Patient will benefit from skilled therapeutic intervention in order to improve the following deficits and impairments:  Postural dysfunction, Decreased range of motion, Impaired UE functional use, Pain, Decreased knowledge of precautions, Increased fascial restricitons, Decreased strength, Decreased activity tolerance, Difficulty walking, Decreased endurance  Visit Diagnosis: Malignant neoplasm of lower-inner quadrant of right breast of female, estrogen receptor positive [facility])  Aftercare following surgery for neoplasm     Problem List Patient Active Problem List   Diagnosis Date Noted   Right otitis media 09/26/2021   Port-A-Cath in place 02/07/2021   Genetic testing 02/02/2021   Family history of throat cancer 01/18/2021   Family history of lymphoma 01/18/2021   Malignant neoplasm of lower-inner quadrant of right breast of female, estrogen receptor positive ([place]) 01/12/2021   Posterior tibial tendinitis, right leg 01/26/2020   Diabetes ([place]) 03/11/2017   Muscle pain 08/06/2013   Tiredness 08/06/2013   Candida vaginitis 08/19/2012   Dental cavities 08/19/2012   Knee pain, bilateral 04/16/2012   Diabetes mellitus type 2, controlled ([place]) 04/16/2012   Hyperlipidemia 04/16/2012   Palpitations 04/16/2012   Back pain 04/16/2012    Stark Bray, PT 12/05/2021, 3:54 PM  [place] @ [place], [place], [address] Phone: [phone]   Fax:  [phone]  Name: RIELYN KRUPINSKI MRN: [id number] Date of Birth: [birth year]

## 2021-12-06 ENCOUNTER — Encounter: Payer: Self-pay | Admitting: Rehabilitation

## 2021-12-06 ENCOUNTER — Ambulatory Visit: Payer: 59 | Admitting: Rehabilitation

## 2021-12-06 DIAGNOSIS — Z170 Estrogen receptor positive status [ER+]: Secondary | ICD-10-CM

## 2021-12-06 DIAGNOSIS — M6281 Muscle weakness (generalized): Secondary | ICD-10-CM

## 2021-12-06 DIAGNOSIS — Z483 Aftercare following surgery for neoplasm: Secondary | ICD-10-CM

## 2021-12-06 DIAGNOSIS — R262 Difficulty in walking, not elsewhere classified: Secondary | ICD-10-CM

## 2021-12-06 DIAGNOSIS — C50311 Malignant neoplasm of lower-inner quadrant of right female breast: Secondary | ICD-10-CM

## 2021-12-06 NOTE — Therapy (Signed)
[facility] @ [place], [place], [address] Phone: [phone]   Fax:  [phone]  Physical Therapy Treatment  Patient Details  Name: Rebecca [name] MRN: [id number] Date of Birth: 07-07-1966 Referring Provider (PT): Dr. Isidore Moos   Encounter Date: 12/06/2021   PT End of Session - 12/06/21 1501     Visit Number 9    Number of Visits 16    Date for PT Re-Evaluation 01/16/22    Authorization - Visit Number 9    Authorization - Number of Visits 30    PT Start Time 4196    PT Stop Time 2229    PT Time Calculation (min) 46 min    Activity Tolerance Patient tolerated treatment well    Behavior During Therapy [facility] for tasks assessed/performed             Past Medical History:  Diagnosis Date   Arthritis    Breast cancer ([place])    Diabetes mellitus    Family history of lymphoma 01/18/2021   Family history of throat cancer 01/18/2021   Hyperlipidemia    Hypertension     Past Surgical History:  Procedure Laterality Date   BREAST LUMPECTOMY WITH RADIOACTIVE SEED AND SENTINEL LYMPH NODE BIOPSY Right 07/11/2021   Procedure: RIGHT BREAST LUMPECTOMY WITH RADIOACTIVE SEED AND RIGHT SENTINEL LYMPH NODE BIOPSY;  Surgeon: Erroll Luna, MD;  Location: [place];  Service: General;  Laterality: Right;   [place] N/A 01/26/2021   Procedure: INSERTION PORT-A-CATH;  Surgeon: Erroll Luna, MD;  Location: [place];  Service: General;  Laterality: N/A;  60   RADIOACTIVE SEED GUIDED AXILLARY SENTINEL LYMPH NODE Right 07/11/2021   Procedure: RADIOACTIVE SEED GUIDED RIGHT AXILLARY LYMPH NODE BIOPSY;  Surgeon: Erroll Luna, MD;  Location: [place];  Service: General;  Laterality: Right;    There were no vitals filed for this visit.   Subjective Assessment - 12/06/21 1502     Subjective The legs are still hurting    Pertinent History Patient was diagnosed on  01/03/2021 with right grade III invasive ductal carcinoma breast cancer. There are 2 areas that measure 1 cm and 1.1 cm and are located in the lower inner quadrant. It is weakly ER positive (10%), PR negative and HER2 negative with a Ki67 of 90%. She has a positive axillary lymph node. Underwent R lumpectomy and SLNB on 07/11/21 (3 nodes), has completed radiation in 09/2021 still receving infusions every 3 weeks    Currently in Pain? Yes    Pain Score 6     Pain Location Leg    Pain Orientation Left;Right    Pain Descriptors / Indicators Aching;Sore    Pain Type Acute pain    Pain Onset In the past 7 days    Pain Frequency Constant    Aggravating Factors  at night I get cramps                               [facility] Adult PT Treatment/Exercise - 12/06/21 0001       Shoulder Exercises: Pulleys   Flexion 2 minutes    Scaption 2 minutes    ABduction --    ABduction Limitations switched to scaption due to abduction pain due to guarding      Shoulder Exercises: Therapy Ball   Flexion Both;5 reps  Shoulder Exercises: ROM/Strengthening   Other ROM/Strengthening Exercises in front of mirror abduction dowel x 5 and flexion x 5 with max vcs and demo but pt still having a hard time repeating the correct movement      Manual Therapy   Soft tissue mobilization in supine to R lateral trunk in area of lats and serratus and in sidleying periscapular muscles    Scapular Mobilization in L sidelying to R scapula in to protraction and retraction and elevation/depression    Passive ROM to Rt shoulder but with increased guarding as lat starts to stretch and become uncomfortable, to tolerance all directions                          PT Long Term Goals - 12/05/21 1411       PT LONG TERM GOAL #1   Title Pt will demonstrate 165 degrees of R shoulder abduction to allow her to reach out to the side without pain.    Baseline 152; 12/05/21: 125 with pain lateral trunk and  pull    Status On-going      PT LONG TERM GOAL #2   Title Pt will decrease pain overall by 50% with standing activites to increase comfort    Baseline 12/05/21:  not really changed    Status On-going      PT LONG TERM GOAL #3   Title Pt will be able to complete 10 sit to stands without use of UEs in 30 sec to decrease fall risk.    Baseline too much pain to reassess in the legs      PT LONG TERM GOAL #4   Title Pt will demonstrate 4/5 bilateral hip flexor strength to decrease fall risk    Baseline 3/5 bilaterally    Status On-going      PT LONG TERM GOAL #5   Title Pt will be independent in a home exercise program for continued strengthening and stretching    Status On-going      PT LONG TERM GOAL #6   Title Pt will demonstrate 4/5 bilaeral quad strength to decrease fall risk.    Baseline R 3/5 L 3+/5 (pain today)    Status On-going                   Plan - 12/06/21 1556     Clinical Impression Statement Continued POC today.  Improved tolerance to ROM today but still has guarding with abduction and spasm and pain in the latissimus area.  Improved with relaxation and distraction but this does not last long during PROM.    Examination-Activity Limitations Carry;Dressing;Sleep;Reach Overhead;Bend;Lift;Stairs;Locomotion Level;Stand;Squat;Bathing    Stability/Clinical Decision Making Stable/Uncomplicated    Rehab Potential Good    PT Frequency 2x / week    PT Duration 6 weeks    PT Treatment/Interventions ADLs/Self Care Home Management;Therapeutic exercise;Patient/family education;Therapeutic activities;Neuromuscular re-education;Balance training;Manual techniques;Scar mobilization;Passive range of motion    PT Next Visit Plan [facility]? Cont postural TE, core activation and activities, pulleys, PROM / STM Rt upper quadrant, strengthing LEs and encouraging pt to be more active during day, STM to R axilla and pecs    Consulted and Agree with Plan of Care Patient              Patient will benefit from skilled therapeutic intervention in order to improve the following deficits and impairments:  Postural dysfunction, Decreased range of motion, Impaired UE functional use, Pain, Decreased knowledge of  precautions, Increased fascial restricitons, Decreased strength, Decreased activity tolerance, Difficulty walking, Decreased endurance  Visit Diagnosis: Malignant neoplasm of lower-inner quadrant of right breast of female, estrogen receptor positive ([place])  Aftercare following surgery for neoplasm  Muscle weakness (generalized)  Difficulty in walking, not elsewhere classified     Problem List Patient Active Problem List   Diagnosis Date Noted   Right otitis media 09/26/2021   Port-A-Cath in place 02/07/2021   Genetic testing 02/02/2021   Family history of throat cancer 01/18/2021   Family history of lymphoma 01/18/2021   Malignant neoplasm of lower-inner quadrant of right breast of female, estrogen receptor positive ([place]) 01/12/2021   Posterior tibial tendinitis, right leg 01/26/2020   Diabetes ([place]) 03/11/2017   Muscle pain 08/06/2013   Tiredness 08/06/2013   Candida vaginitis 08/19/2012   Dental cavities 08/19/2012   Knee pain, bilateral 04/16/2012   Diabetes mellitus type 2, controlled ([place]) 04/16/2012   Hyperlipidemia 04/16/2012   Palpitations 04/16/2012   Back pain 04/16/2012    Stark Bray, PT 12/06/2021, 4:02 PM  [facility] @ [place], [place], [address] Phone: [phone]   Fax:  [phone]  Name: ANELIESE BEAUDRY MRN: [id number] Date of Birth: [birth year]

## 2021-12-11 ENCOUNTER — Inpatient Hospital Stay: Payer: 59 | Attending: Oncology | Admitting: Hematology and Oncology

## 2021-12-11 ENCOUNTER — Inpatient Hospital Stay: Payer: 59

## 2021-12-11 ENCOUNTER — Encounter: Payer: Self-pay | Admitting: Hematology and Oncology

## 2021-12-11 ENCOUNTER — Other Ambulatory Visit: Payer: Self-pay

## 2021-12-11 VITALS — HR 96

## 2021-12-11 DIAGNOSIS — Z170 Estrogen receptor positive status [ER+]: Secondary | ICD-10-CM

## 2021-12-11 DIAGNOSIS — C50311 Malignant neoplasm of lower-inner quadrant of right female breast: Secondary | ICD-10-CM

## 2021-12-11 DIAGNOSIS — M2550 Pain in unspecified joint: Secondary | ICD-10-CM

## 2021-12-11 DIAGNOSIS — Z95828 Presence of other vascular implants and grafts: Secondary | ICD-10-CM

## 2021-12-11 DIAGNOSIS — Z5112 Encounter for antineoplastic immunotherapy: Secondary | ICD-10-CM | POA: Diagnosis not present

## 2021-12-11 DIAGNOSIS — M25519 Pain in unspecified shoulder: Secondary | ICD-10-CM | POA: Insufficient documentation

## 2021-12-11 DIAGNOSIS — Z79899 Other long term (current) drug therapy: Secondary | ICD-10-CM | POA: Diagnosis not present

## 2021-12-11 LAB — CMP (CANCER CENTER ONLY)
ALT: 25 U/L (ref 0–44)
AST: 23 U/L (ref 15–41)
Albumin: 4.2 g/dL (ref 3.5–5.0)
Alkaline Phosphatase: 78 U/L (ref 38–126)
Anion gap: 11 (ref 5–15)
BUN: 16 mg/dL (ref 6–20)
CO2: 26 mmol/L (ref 22–32)
Calcium: 9.5 mg/dL (ref 8.9–10.3)
Chloride: 100 mmol/L (ref 98–111)
Creatinine: 0.57 mg/dL (ref 0.44–1.00)
GFR, Estimated: >60 mL/min (ref >60)
Glucose, Bld: 176 mg/dL — ABNORMAL HIGH (ref 70–99)
Potassium: 3.5 mmol/L (ref 3.5–5.1)
Sodium: 137 mmol/L (ref 135–145)
Total Bilirubin: 0.3 mg/dL (ref 0.3–1.2)
Total Protein: 7 g/dL (ref 6.5–8.1)

## 2021-12-11 LAB — CBC WITH DIFFERENTIAL (CANCER CENTER ONLY)
Abs Immature Granulocytes: 0.01 10*3/uL (ref 0.00–0.07)
Basophils Absolute: 0 10*3/uL (ref 0.0–0.1)
Basophils Relative: 0 %
Eosinophils Absolute: 0.1 10*3/uL (ref 0.0–0.5)
Eosinophils Relative: 2 %
HCT: 38.9 % (ref 36.0–46.0)
Hemoglobin: 13 g/dL (ref 12.0–15.0)
Immature Granulocytes: 0 %
Lymphocytes Relative: 24 %
Lymphs Abs: 1.2 10*3/uL (ref 0.7–4.0)
MCH: 26.5 pg (ref 26.0–34.0)
MCHC: 33.4 g/dL (ref 30.0–36.0)
MCV: 79.4 fL — ABNORMAL LOW (ref 80.0–100.0)
Monocytes Absolute: 0.5 10*3/uL (ref 0.1–1.0)
Monocytes Relative: 10 %
Neutro Abs: 3.1 10*3/uL (ref 1.7–7.7)
Neutrophils Relative %: 64 %
Platelet Count: 229 10*3/uL (ref 150–400)
RBC: 4.9 MIL/uL (ref 3.87–5.11)
RDW: 14.2 % (ref 11.5–15.5)
WBC Count: 5 10*3/uL (ref 4.0–10.5)
nRBC: 0 % (ref 0.0–0.2)

## 2021-12-11 LAB — TSH: TSH: 1.295 u[IU]/mL (ref 0.308–3.960)

## 2021-12-11 MED ORDER — SODIUM CHLORIDE 0.9 % IV SOLN
200.0000 mg | Freq: Once | INTRAVENOUS | Status: AC
  Administered 2021-12-11: 200 mg via INTRAVENOUS
  Filled 2021-12-11: qty 200

## 2021-12-11 MED ORDER — ANTICOAGULANT SODIUM CITRATE LOCK FLUSH 4% (120 MG/3ML) IV SOLN
5.0000 mL | PREFILLED_SYRINGE | Freq: Once | INTRAVENOUS | Status: AC
  Administered 2021-12-11: 5 mL
  Filled 2021-12-11: qty 6

## 2021-12-11 MED ORDER — SODIUM CHLORIDE 0.9% FLUSH
10.0000 mL | INTRAVENOUS | Status: DC | PRN
  Administered 2021-12-11: 10 mL

## 2021-12-11 MED ORDER — SODIUM CHLORIDE 0.9% FLUSH
10.0000 mL | INTRAVENOUS | Status: DC | PRN
  Administered 2021-12-11: 10 mL via INTRAVENOUS

## 2021-12-11 MED ORDER — SODIUM CHLORIDE 0.9 % IV SOLN: Freq: Once | INTRAVENOUS | Status: AC

## 2021-12-11 NOTE — Patient Instructions (Signed)
[facility] MEDICAL [facility]COLOG[facility]  Discharge Instructions: Thank you for choosing [facility] to provide your oncology and hematology care.   If you have a lab appointment with the Cancer Center, please go directly to the Cancer Center and check in at the registration area.   Wear comfortable clothing and clothing appropriate for easy access to any Portacath or PICC line.   We strive to give you quality time with your provider. You may need to reschedule your appointment if you arrive late (15 or more minutes).  Arriving late affects you and other patients whose appointments are after yours.  Also, if you miss three or more appointments without notifying the office, you may be dismissed from the clinic at the provider's discretion.      For prescription refill requests, have your pharmacy contact our office and allow 72 hours for refills to be completed.    Today you received the following chemotherapy and/or immunotherapy agents: keytruda      To help prevent nausea and vomiting after your treatment, we encourage you to take your nausea medication as directed.  BELOW ARE SYMPTOMS THAT SHOULD BE REPORTED IMMEDIATELY: *FEVER GREATER THAN 100.4 F (38 C) OR HIGHER *CHILLS OR SWEATING *NAUSEA AND VOMITING THAT IS NOT CONTROLLED WITH YOUR NAUSEA MEDICATION *UNUSUAL SHORTNESS OF BREATH *UNUSUAL BRUISING OR BLEEDING *URINARY PROBLEMS (pain or burning when urinating, or frequent urination) *BOWEL PROBLEMS (unusual diarrhea, constipation, pain near the anus) TENDERNESS IN MOUTH AND THROAT WITH OR WITHOUT PRESENCE OF ULCERS (sore throat, sores in mouth, or a toothache) UNUSUAL RASH, SWELLING OR PAIN  UNUSUAL VAGINAL DISCHARGE OR ITCHING   Items with * indicate a potential emergency and should be followed up as soon as possible or go to the Emergency Department if any problems should occur.  Please show the CHEMOTHERAPY ALERT CARD or IMMUNOTHERAPY ALERT CARD at check-in to  the Emergency Department and triage nurse.  Should you have questions after your visit or need to cancel or reschedule your appointment, please contact [facility]  Dept: [phone]  and follow the prompts.  Office hours are 8:00 a.m. to 4:30 p.m. Monday - Friday. Please note that voicemails left after 4:00 p.m. may not be returned until the following business day.  We are closed weekends and major holidays. You have access to a nurse at all times for urgent questions. Please call the main number to the clinic Dept: [phone] and follow the prompts.   For any non-urgent questions, you may also contact your provider using MyChart. We now offer e-Visits for anyone 18 and older to request care online for non-urgent symptoms. For details visit [email or web address].   Also download the MyChart app! Go to the app store, search "MyChart", open the app, select , and log in with your MyChart username and password.  Due to Covid, a mask is required upon entering the hospital/clinic. If you do not have a mask, one will be given to you upon arrival. For doctor visits, patients may have 1 support person aged 18 or older with them. For treatment visits, patients cannot have anyone with them due to current Covid guidelines and our immunocompromised population.   

## 2021-12-11 NOTE — Assessment & Plan Note (Signed)
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  She is still able to function and do all her activities of daily living although she is slow. Okay to proceed with immunotherapy as planned.

## 2021-12-11 NOTE — Assessment & Plan Note (Signed)
Rebecca [name] has a history of stage IIIb triple negative breast cancer.  She continues on maintenance pembrolizumab. She is tolerating it well except for some generalized arthralgias, myalgias as well as fatigue.  Physical examination today without any major concerns.  Okay to proceed with Keytruda, CBC and CMP reviewed and satisfactory.   She will receive a total of 9 cycles adjuvant, last cycle is likely to happen on 01/22/2022. Post immunotherapy, she will have to continue diagnostic mammograms for both breasts for about 5 years at least and then she can go onto screening mammogram after that.

## 2021-12-11 NOTE — Progress Notes (Signed)
[facility] Follow up:    Rebecca Rakes, MD [place] [address]   DIAGNOSIS:  Cancer Staging  Malignant neoplasm of lower-inner quadrant of right breast of female, estrogen receptor positive ([place]) Staging form: Breast, AJCC 8th Edition - Clinical: Stage IIIB (cT2, cN1, cM0, G3, ER-, PR-, HER2-) - Signed by Chauncey Cruel, MD on 01/18/2021 Stage prefix: Initial diagnosis Histologic grading system: 3 grade system   SUMMARY OF ONCOLOGIC HISTORY: Oncology History  Malignant neoplasm of lower-inner quadrant of right breast of female, estrogen receptor positive ([place])  01/06/2021 Initial Diagnosis   [place] woman status post right breast lower inner quadrant biopsy 01/06/2021 for a clinically T2 N1, stage IIIB 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  The report date is January 31, 2021.  The CancerNext-Expanded gene panel offered by [facility] and includes sequencing, rearrangement, and RNA analysis for the following 77 genes: AIP, ALK, APC, ATM, AXIN2, BAP1, BARD1, BLM, BMPR1A, BRCA1, BRCA2, BRIP1, CDC73, CDH1, CDK4, CDKN1B, CDKN2A, CHEK2, CTNNA1, DICER1, FANCC, FH, FLCN, GALNT12, KIF1B, LZTR1, MAX, MEN1, MET, MLH1, MSH2, MSH3, MSH6, MUTYH, NBN, NF1, NF2, NTHL1, PALB2, PHOX2B, PMS2, POT1, PRKAR1A, PTCH1, PTEN, RAD51C, RAD51D, RB1, RECQL,  RET, SDHA, SDHAF2, SDHB, SDHC, SDHD, SMAD4, SMARCA4, SMARCB1, SMARCE1, STK11, SUFU, TMEM127, TP53, TSC1, TSC2, VHL and XRCC2 (sequencing and deletion/duplication); EGFR, EGLN1, HOXB13, KIT, MITF, PDGFRA, POLD1, and POLE (sequencing only); EPCAM and GREM1 (deletion/duplication only).    02/07/2021 - 05/23/2021 Neo-Adjuvant Chemotherapy    neoadjuvant chemotherapy consisting of pembrolizumab given day 1 together with carboplatin and paclitaxel, with Taxol given on day 8 and 15, and filgrastim on days 16,17 and 18, started 02/07/2021, repeated every 21 days x 2, followed by doxorubicin and cyclophosphamide every 21 days x 4 also given with pembrolizumab on day 1 and PEG filgrastim day 3, started 03/22/2021 and completed 05/23/2021             (a) carboplatin/paclitaxel discontinued after the cycle 2-day 8 dose with neuropathy developing (subsequently resolved)   06/13/2021 -  Chemotherapy   Patient is on Treatment Plan : HEAD/NECK Pembrolizumab Q21D     07/11/2021 Surgery   right lumpectomy and sentinel lymph node sampling 07/11/2021 showed a complete pathologic response (ypT0 ypN0)             (a) a total of 3 right axillary lymph nodes were removed   08/16/2021 - 09/29/2021 Radiation Therapy   08/16/2021 through 09/29/2021 Site Technique Total Dose (Gy) Dose per Fx (Gy) Completed Fx Beam Energies  Breast, Right: Breast_Rt_IMN 3D 50/50 2 25/25 10X  Breast, Right: Breast_Rt_PAB_SCV 3D 50/50 2 25/25 10X, 15X  Breast, Right: Breast_Rt_Bst 3D 10/10 2 5/5 6X, 10X      CURRENT THERAPY: Pembrolizumab  INTERVAL HISTORY:  Rebecca [name] 56 y.o. female  is here for follow up accompanied by an Arabic interpreter She continues on treatment with pembrolizumab every 3 weeks.   She is doing well today.  No concerns except for fatigue.  She feels like every little activity she she does at home is taking a toll on her.  She has a sister at home who helps her.  Besides fatigue, she also reports some ongoing  right ear infection and she has a follow-up with an ENT doctor in a couple weeks.  No change in breathing, bowel habits or urinary habits otherwise.  She is hoping that her disability check tomorrow gets approved. Rest of the pertinent 10 point ROS reviewed and negative.   Patient Active Problem List   Diagnosis Date Noted   Arthralgia 12/11/2021   Right otitis media 09/26/2021   Port-A-Cath in place 02/07/2021   Genetic testing 02/02/2021   Family history of throat cancer 01/18/2021   Family history of lymphoma 01/18/2021   Malignant neoplasm of lower-inner quadrant of right breast of female, estrogen receptor positive ([place]) 01/12/2021   Posterior tibial tendinitis, right leg 01/26/2020   Diabetes ([place]) 03/11/2017   Muscle pain 08/06/2013   Tiredness 08/06/2013   [place] vaginitis 08/19/2012   Dental cavities 08/19/2012   Knee pain, bilateral 04/16/2012   Diabetes mellitus type 2, controlled ([place]) 04/16/2012   Hyperlipidemia 04/16/2012   Palpitations 04/16/2012   Back pain 04/16/2012    is allergic to lisinopril and heparin sod (pork) lock flush.  MEDICAL HISTORY: Past Medical History:  Diagnosis Date   Arthritis    Breast cancer ([place])    Diabetes mellitus    Family history of lymphoma 01/18/2021   Family history of throat cancer 01/18/2021   Hyperlipidemia    Hypertension     SURGICAL HISTORY: Past Surgical History:  Procedure Laterality Date   BREAST LUMPECTOMY WITH RADIOACTIVE SEED AND SENTINEL LYMPH NODE BIOPSY Right 07/11/2021   Procedure: RIGHT BREAST LUMPECTOMY WITH RADIOACTIVE SEED AND RIGHT SENTINEL LYMPH NODE BIOPSY;  Surgeon: Erroll Luna, MD;  Location: [facility];  Service: General;  Laterality: Right;   CESAREAN SECTION     PORTACATH PLACEMENT N/A 01/26/2021   Procedure: INSERTION PORT-A-CATH;  Surgeon: Erroll Luna, MD;  Location: [facility];  Service: General;  Laterality: N/A;  72   [place] Right  07/11/2021   Procedure: RADIOACTIVE SEED GUIDED RIGHT AXILLARY LYMPH NODE BIOPSY;  Surgeon: Erroll Luna, MD;  Location: [facility];  Service: General;  Laterality: Right;    SOCIAL HISTORY: Social History   Socioeconomic History   Marital status: Divorced    Spouse name: Not on file   Number of children: Not on file   Years of education: Not on file   Highest education level: Not on file  Occupational History   Not on file  Tobacco Use   Smoking status: Never    Passive exposure: Yes   Smokeless tobacco: Never  Vaping Use   Vaping Use: Never used  Substance and Sexual Activity   Alcohol use: No   Drug use: No   Sexual activity: Not Currently  Other Topics Concern   Not on file  Social History Narrative   Not on file   Social Determinants of Health   Financial Resource Strain: Not on file  Food Insecurity: Not on file  Transportation Needs: No Transportation Needs   Lack of Transportation (Medical): No   Lack of Transportation (Non-Medical): No  Physical Activity: Not  on file  Stress: Not on file  Social Connections: Not on file  Intimate Partner Violence: Not on file    FAMILY HISTORY: Family History  Problem Relation Age of Onset   Diabetes Mother    Heart disease Mother    Diabetes Father    Throat cancer Father        d. 76s   Diabetes Sister    Diabetes Brother    Lymphoma Cousin        maternal cousin; d. 55s    Review of Systems  Constitutional:  Positive for fatigue. Negative for appetite change, chills, fever and unexpected weight change.  HENT:   Negative for hearing loss, lump/mass and trouble swallowing.   Eyes:  Negative for eye problems and icterus.  Respiratory:  Negative for chest tightness, cough and shortness of breath.   Cardiovascular:  Negative for chest pain, leg swelling and palpitations.  Gastrointestinal:  Negative for abdominal distention, abdominal pain, constipation, diarrhea, nausea and vomiting.  Endocrine:  Negative for hot flashes.  Genitourinary:  Negative for difficulty urinating.   Musculoskeletal:  Positive for arthralgias and myalgias.  Skin:  Negative for itching and rash.  Neurological:  Negative for dizziness, extremity weakness, headaches and numbness.  Hematological:  Negative for adenopathy. Does not bruise/bleed easily.  Psychiatric/Behavioral:  Negative for depression. The patient is not nervous/anxious.      PHYSICAL EXAMINATION  ECOG PERFORMANCE STATUS: 1 - Symptomatic but completely ambulatory  BP (!) 126/91 (BP Location: Left Arm, Patient Position: Sitting)    Pulse (!) 110    Temp 98.1 F (36.7 C) (Temporal)    Resp 16    Ht 5' 3"  (1.6 m)    Wt 151 lb (68.5 kg)    SpO2 100%    BMI 26.75 kg/m    Physical Exam Constitutional:      General: She is not in acute distress.    Appearance: Normal appearance. She is not toxic-appearing.  HENT:     Head: Normocephalic and atraumatic.  Eyes:     General: No scleral icterus. Cardiovascular:     Rate and Rhythm: Normal rate and regular rhythm.     Pulses: Normal pulses.     Heart sounds: Normal heart sounds.  Pulmonary:     Effort: Pulmonary effort is normal.     Breath sounds: Normal breath sounds.  Chest:     Comments: Breast exam deferred today Abdominal:     General: Abdomen is flat. Bowel sounds are normal. There is no distension.     Palpations: Abdomen is soft.     Tenderness: There is no abdominal tenderness.  Musculoskeletal:        General: No swelling.     Cervical back: Neck supple.  Lymphadenopathy:     Cervical: No cervical adenopathy.  Skin:    General: Skin is warm and dry.     Findings: No rash.  Neurological:     General: No focal deficit present.     Mental Status: She is alert.  Psychiatric:        Mood and Affect: Mood normal.        Behavior: Behavior normal.    LABORATORY DATA:  CBC    Component Value Date/Time   WBC 5.0 12/11/2021 1335   WBC 4.0 07/03/2021 1254   RBC 4.90  12/11/2021 1335   HGB 13.0 12/11/2021 1335   HCT 38.9 12/11/2021 1335   PLT 229 12/11/2021 1335   MCV 79.4 (L) 12/11/2021 1335  MCH 26.5 12/11/2021 1335   MCHC 33.4 12/11/2021 1335   RDW 14.2 12/11/2021 1335   LYMPHSABS 1.2 12/11/2021 1335   MONOABS 0.5 12/11/2021 1335   EOSABS 0.1 12/11/2021 1335   BASOSABS 0.0 12/11/2021 1335    CMP     Component Value Date/Time   NA 137 12/11/2021 1335   NA 140 12/27/2020 1123   K 3.5 12/11/2021 1335   CL 100 12/11/2021 1335   CO2 26 12/11/2021 1335   GLUCOSE 176 (H) 12/11/2021 1335   BUN 16 12/11/2021 1335   BUN 11 12/27/2020 1123   CREATININE 0.57 12/11/2021 1335   CREATININE 0.50 11/02/2016 1102   CALCIUM 9.5 12/11/2021 1335   PROT 7.0 12/11/2021 1335   PROT 7.5 12/27/2020 1123   ALBUMIN 4.2 12/11/2021 1335   ALBUMIN 4.7 12/27/2020 1123   AST 23 12/11/2021 1335   ALT 25 12/11/2021 1335   ALKPHOS 78 12/11/2021 1335   BILITOT 0.3 12/11/2021 1335   GFRNONAA >60 12/11/2021 1335   GFRNONAA >89 01/16/2016 1006   GFRAA 118 06/02/2020 0942   GFRAA >89 01/16/2016 1006    ASSESSMENT and THERAPY PLAN:   Malignant neoplasm of lower-inner quadrant of right breast of female, estrogen receptor positive ([facility]) Leanna Sato has a history of stage IIIb triple negative breast cancer.  She continues on maintenance pembrolizumab. She is tolerating it well except for some generalized arthralgias, myalgias as well as fatigue.  Physical examination today without any major concerns.  Okay to proceed with Keytruda, CBC and CMP reviewed and satisfactory.   She will receive a total of 9 cycles adjuvant, last cycle is likely to happen on 01/22/2022. Post immunotherapy, she will have to continue diagnostic mammograms for both breasts for about 5 years at least and then she can go onto screening mammogram after that.  Arthralgia It appears that she has arthralgias and myalgias secondary to immunotherapy, grade 1.  She is still able to function and do all her  activities of daily living although she is slow. Okay to proceed with immunotherapy as planned.   No orders of the defined types were placed in this encounter.   All questions were answered. The patient knows to call the clinic with any problems, questions or concerns. We can certainly see the patient much sooner if necessary.  Total encounter time: 30 minutes in face-to-face visit time, chart review, lab review, care coordination, and documentation of the encounter.  Benay Pike MD    *Total Encounter Time as defined by the Centers for Medicare and Medicaid Services includes, in addition to the face-to-face time of a patient visit (documented in the note above) non-face-to-face time: obtaining and reviewing outside history, ordering and reviewing medications, tests or procedures, care coordination (communications with other health care professionals or caregivers) and documentation in the medical record.

## 2021-12-12 ENCOUNTER — Other Ambulatory Visit: Payer: 59

## 2021-12-12 ENCOUNTER — Ambulatory Visit: Payer: 59 | Admitting: Physical Therapy

## 2021-12-12 ENCOUNTER — Inpatient Hospital Stay: Payer: 59

## 2021-12-12 ENCOUNTER — Ambulatory Visit: Payer: 59 | Admitting: Hematology and Oncology

## 2021-12-19 ENCOUNTER — Encounter: Payer: 59 | Admitting: Physical Therapy

## 2021-12-21 ENCOUNTER — Encounter: Payer: 59 | Admitting: Physical Therapy

## 2021-12-21 ENCOUNTER — Other Ambulatory Visit: Payer: Self-pay | Admitting: Family Medicine

## 2021-12-21 DIAGNOSIS — E119 Type 2 diabetes mellitus without complications: Secondary | ICD-10-CM

## 2021-12-21 NOTE — Telephone Encounter (Signed)
Requested Prescriptions  ?Pending Prescriptions Disp Refills  ?? FARXIGA 10 MG TABS tablet [Pharmacy Med Name: FARXIGA 10MG TABLETS] 90 tablet 0  ?  Sig: TAKE 1 TABLET(10 MG) BY MOUTH DAILY WITH BREAKFAST  ?  ? Endocrinology:  Diabetes - SGLT2 Inhibitors Passed - 12/21/2021  6:52 AM  ?  ?  Passed - Cr in normal range and within 360 days  ?  Creatinine  ?Date Value Ref Range Status  ?12/11/2021 0.57 0.44 - 1.00 mg/dL Final  ? ?Creat  ?Date Value Ref Range Status  ?11/02/2016 0.50 0.50 - 1.10 mg/dL Final  ? ?Creatinine, Urine  ?Date Value Ref Range Status  ?11/02/2016 141 20 - 320 mg/dL Final  ?   ?  ?  Passed - HBA1C is between 0 and 7.9 and within 180 days  ?  HbA1c, POC (controlled diabetic range)  ?Date Value Ref Range Status  ?06/29/2021 7.5 (A) 0.0 - 7.0 % Final  ?   ?  ?  Passed - eGFR in normal range and within 360 days  ?  GFR, Est African American  ?Date Value Ref Range Status  ?01/16/2016 >89 >=60 mL/min Final  ? ?GFR calc Af Wyvonnia Lora  ?Date Value Ref Range Status  ?06/02/2020 118 >59 mL/min/1.73 Final  ?  Comment:  ?  **[facility] currently reports eGFR in compliance with the current** ?  recommendations of the [employer]. [facility] will ?  update reporting as new guidelines are published from the NKF-ASN ?  Task force. ?  ? ?GFR, Est Non African American  ?Date Value Ref Range Status  ?01/16/2016 >89 >=60 mL/min Final  ?  Comment:  ?    ?The estimated GFR is a calculation valid for adults (>=49 years old) ?that uses the CKD-EPI algorithm to adjust for age and sex. It is   ?not to be used for children, pregnant women, hospitalized patients,    ?patients on dialysis, or with rapidly changing kidney function. ?According to the NKDEP, eGFR >89 is normal, 60-89 shows mild ?impairment, 30-59 shows moderate impairment, 15-29 shows severe ?impairment and <15 is ESRD. ?  ?  ? ?GFR, Estimated  ?Date Value Ref Range Status  ?12/11/2021 >60 >60 mL/min Final  ?  Comment:  ?  (NOTE) ?Calculated using the CKD-EPI  Creatinine Equation (2021) ?  ? ?eGFR  ?Date Value Ref Range Status  ?12/27/2020 111 >59 mL/min/1.73 Final  ?   ?  ?  Passed - Valid encounter within last 6 months  ?  Recent Outpatient Visits   ?      ? 2 months ago Type 2 diabetes mellitus without complication, without long-term current use of insulin ([place])  ? [place], [place], [place]  ? 3 months ago Other acute nonsuppurative otitis media of right ear, recurrence not specified  ? [place] Ladell Pier, MD  ? 5 months ago Type 2 diabetes mellitus without complication, without long-term current use of insulin ([place])  ? [place], [place], MD  ? 11 months ago Annual physical exam  ? [place], [name], [place]  ? 1 year ago Vasomotor symptoms due to menopause  ? [place] [name], MD  ?  ?  ?Future Appointments   ?        ? In 3 weeks [name], MD [place]  ?  ? ?  ?  ?  ? ? ?

## 2021-12-26 ENCOUNTER — Encounter: Payer: 59 | Admitting: Rehabilitation

## 2021-12-26 ENCOUNTER — Ambulatory Visit: Payer: 59

## 2021-12-26 ENCOUNTER — Other Ambulatory Visit: Payer: 59

## 2021-12-26 ENCOUNTER — Encounter: Payer: Self-pay | Admitting: *Deleted

## 2021-12-28 ENCOUNTER — Encounter: Payer: 59 | Admitting: Physical Therapy

## 2022-01-01 ENCOUNTER — Ambulatory Visit: Payer: 59 | Attending: Radiation Oncology

## 2022-01-01 DIAGNOSIS — R262 Difficulty in walking, not elsewhere classified: Secondary | ICD-10-CM | POA: Insufficient documentation

## 2022-01-01 DIAGNOSIS — M25512 Pain in left shoulder: Secondary | ICD-10-CM | POA: Insufficient documentation

## 2022-01-01 DIAGNOSIS — M25611 Stiffness of right shoulder, not elsewhere classified: Secondary | ICD-10-CM | POA: Insufficient documentation

## 2022-01-01 DIAGNOSIS — Z170 Estrogen receptor positive status [ER+]: Secondary | ICD-10-CM | POA: Insufficient documentation

## 2022-01-01 DIAGNOSIS — M25511 Pain in right shoulder: Secondary | ICD-10-CM | POA: Insufficient documentation

## 2022-01-01 DIAGNOSIS — M6281 Muscle weakness (generalized): Secondary | ICD-10-CM | POA: Insufficient documentation

## 2022-01-01 DIAGNOSIS — C50311 Malignant neoplasm of lower-inner quadrant of right female breast: Secondary | ICD-10-CM | POA: Insufficient documentation

## 2022-01-01 DIAGNOSIS — Z483 Aftercare following surgery for neoplasm: Secondary | ICD-10-CM | POA: Insufficient documentation

## 2022-01-02 ENCOUNTER — Ambulatory Visit: Payer: Self-pay

## 2022-01-02 ENCOUNTER — Ambulatory Visit: Payer: 59 | Admitting: Hematology and Oncology

## 2022-01-02 ENCOUNTER — Encounter: Payer: 59 | Admitting: Rehabilitation

## 2022-01-02 ENCOUNTER — Other Ambulatory Visit: Payer: 59

## 2022-01-04 ENCOUNTER — Encounter: Payer: 59 | Admitting: Physical Therapy

## 2022-01-05 ENCOUNTER — Other Ambulatory Visit: Payer: Self-pay

## 2022-01-05 DIAGNOSIS — C50311 Malignant neoplasm of lower-inner quadrant of right female breast: Secondary | ICD-10-CM

## 2022-01-08 ENCOUNTER — Other Ambulatory Visit: Payer: Self-pay

## 2022-01-08 ENCOUNTER — Inpatient Hospital Stay: Payer: 59

## 2022-01-08 ENCOUNTER — Encounter: Payer: Self-pay | Admitting: Hematology and Oncology

## 2022-01-08 ENCOUNTER — Inpatient Hospital Stay: Payer: 59 | Attending: Adult Health | Admitting: Adult Health

## 2022-01-08 ENCOUNTER — Encounter: Payer: Self-pay | Admitting: Adult Health

## 2022-01-08 VITALS — BP 136/77 | HR 95 | Temp 97.9°F | Resp 16 | Ht 63.0 in | Wt 149.3 lb

## 2022-01-08 DIAGNOSIS — C50311 Malignant neoplasm of lower-inner quadrant of right female breast: Secondary | ICD-10-CM | POA: Insufficient documentation

## 2022-01-08 DIAGNOSIS — Z95828 Presence of other vascular implants and grafts: Secondary | ICD-10-CM

## 2022-01-08 DIAGNOSIS — Z170 Estrogen receptor positive status [ER+]: Secondary | ICD-10-CM | POA: Diagnosis not present

## 2022-01-08 DIAGNOSIS — Z5112 Encounter for antineoplastic immunotherapy: Secondary | ICD-10-CM | POA: Insufficient documentation

## 2022-01-08 DIAGNOSIS — Z79899 Other long term (current) drug therapy: Secondary | ICD-10-CM | POA: Insufficient documentation

## 2022-01-08 LAB — CMP (CANCER CENTER ONLY)
ALT: 24 U/L (ref 0–44)
AST: 22 U/L (ref 15–41)
Albumin: 4 g/dL (ref 3.5–5.0)
Alkaline Phosphatase: 83 U/L (ref 38–126)
Anion gap: 9 (ref 5–15)
BUN: 14 mg/dL (ref 6–20)
CO2: 27 mmol/L (ref 22–32)
Calcium: 9.4 mg/dL (ref 8.9–10.3)
Chloride: 100 mmol/L (ref 98–111)
Creatinine: 0.49 mg/dL (ref 0.44–1.00)
GFR, Estimated: >60 mL/min (ref >60)
Glucose, Bld: 295 mg/dL — ABNORMAL HIGH (ref 70–99)
Potassium: 3.9 mmol/L (ref 3.5–5.1)
Sodium: 136 mmol/L (ref 135–145)
Total Bilirubin: 0.3 mg/dL (ref 0.3–1.2)
Total Protein: 6.9 g/dL (ref 6.5–8.1)

## 2022-01-08 LAB — CBC WITH DIFFERENTIAL (CANCER CENTER ONLY)
Abs Immature Granulocytes: 0.01 10*3/uL (ref 0.00–0.07)
Basophils Absolute: 0 10*3/uL (ref 0.0–0.1)
Basophils Relative: 1 %
Eosinophils Absolute: 0.1 10*3/uL (ref 0.0–0.5)
Eosinophils Relative: 4 %
HCT: 39.4 % (ref 36.0–46.0)
Hemoglobin: 12.6 g/dL (ref 12.0–15.0)
Immature Granulocytes: 0 %
Lymphocytes Relative: 23 %
Lymphs Abs: 0.9 10*3/uL (ref 0.7–4.0)
MCH: 25.7 pg — ABNORMAL LOW (ref 26.0–34.0)
MCHC: 32 g/dL (ref 30.0–36.0)
MCV: 80.2 fL (ref 80.0–100.0)
Monocytes Absolute: 0.4 10*3/uL (ref 0.1–1.0)
Monocytes Relative: 11 %
Neutro Abs: 2.3 10*3/uL (ref 1.7–7.7)
Neutrophils Relative %: 61 %
Platelet Count: 255 10*3/uL (ref 150–400)
RBC: 4.91 MIL/uL (ref 3.87–5.11)
RDW: 14 % (ref 11.5–15.5)
WBC Count: 3.8 10*3/uL — ABNORMAL LOW (ref 4.0–10.5)
nRBC: 0 % (ref 0.0–0.2)

## 2022-01-08 LAB — TSH: TSH: 2.053 u[IU]/mL (ref 0.308–3.960)

## 2022-01-08 MED ORDER — SODIUM CHLORIDE 0.9% FLUSH
10.0000 mL | INTRAVENOUS | Status: DC | PRN
  Administered 2022-01-08: 10 mL

## 2022-01-08 MED ORDER — SODIUM CHLORIDE 0.9 % IV SOLN
200.0000 mg | Freq: Once | INTRAVENOUS | Status: AC
  Administered 2022-01-08: 200 mg via INTRAVENOUS
  Filled 2022-01-08: qty 200

## 2022-01-08 MED ORDER — SODIUM CHLORIDE 0.9% FLUSH
10.0000 mL | Freq: Once | INTRAVENOUS | Status: AC
  Administered 2022-01-08: 10 mL

## 2022-01-08 MED ORDER — SODIUM CHLORIDE 0.9 % IV SOLN: Freq: Once | INTRAVENOUS | Status: AC

## 2022-01-08 NOTE — Patient Instructions (Signed)
[facility]ICAL [facility]COL[facility]  ° Discharge Instructions: °Thank you for choosing [facility] to provide your oncology and hematology care.  ° °If you have a lab appointment with the Cancer Center, please go directly to the Cancer Center and check in at the registration area. °  °Wear comfortable clothing and clothing appropriate for easy access to any Portacath or PICC line.  ° °We strive to give you quality time with your provider. You may need to reschedule your appointment if you arrive late (15 or more minutes).  Arriving late affects you and other patients whose appointments are after yours.  Also, if you miss three or more appointments without notifying the office, you may be dismissed from the clinic at the provider’s discretion.    °  °For prescription refill requests, have your pharmacy contact our office and allow 72 hours for refills to be completed.   ° °Today you received the following chemotherapy and/or immunotherapy agents: Pembrolizumab (Keytruda) °  °To help prevent nausea and vomiting after your treatment, we encourage you to take your nausea medication as directed. ° °BELOW ARE SYMPTOMS THAT SHOULD BE REPORTED IMMEDIATELY: °*FEVER GREATER THAN 100.4 F (38 °C) OR HIGHER °*CHILLS OR SWEATING °*NAUSEA AND VOMITING THAT IS NOT CONTROLLED WITH YOUR NAUSEA MEDICATION °*UNUSUAL SHORTNESS OF BREATH °*UNUSUAL BRUISING OR BLEEDING °*URINARY PROBLEMS (pain or burning when urinating, or frequent urination) °*BOWEL PROBLEMS (unusual diarrhea, constipation, pain near the anus) °TENDERNESS IN MOUTH AND THROAT WITH OR WITHOUT PRESENCE OF ULCERS (sore throat, sores in mouth, or a toothache) °UNUSUAL RASH, SWELLING OR PAIN  °UNUSUAL VAGINAL DISCHARGE OR ITCHING  ° °Items with * indicate a potential emergency and should be followed up as soon as possible or go to the Emergency Department if any problems should occur. ° °Please show the CHEMOTHERAPY ALERT CARD or IMMUNOTHERAPY ALERT CARD  at check-in to the Emergency Department and triage nurse. ° °Should you have questions after your visit or need to cancel or reschedule your appointment, please contact [facility]  Dept: [phone]  and follow the prompts.  Office hours are 8:00 a.m. to 4:30 p.m. Monday - Friday. Please note that voicemails left after 4:00 p.m. may not be returned until the following business day.  We are closed weekends and major holidays. You have access to a nurse at all times for urgent questions. Please call the main number to the clinic Dept: [phone] and follow the prompts. ° ° °For any non-urgent questions, you may also contact your provider using MyChart. We now offer e-Visits for anyone 18 and older to request care online for non-urgent symptoms. For details visit [email or web address]. °  °Also download the MyChart app! Go to the app store, search "MyChart", open the app, select [facility], and log in with your MyChart username and password. ° °Due to Covid, a mask is required upon entering the hospital/clinic. If you do not have a mask, one will be given to you upon arrival. For doctor visits, patients may have 1 support person aged 18 or older with them. For treatment visits, patients cannot have anyone with them due to current Covid guidelines and our immunocompromised population.  ° °

## 2022-01-08 NOTE — Progress Notes (Signed)
Pt ins no longer accepted  at the breast center.  Orders sent to [facility] via fax at [phone] ?

## 2022-01-08 NOTE — Progress Notes (Signed)
[facility] Follow up: ?  ? ?Rebecca Rakes, MD ?[address] ?[place] [address] ? ? ?DIAGNOSIS:  Cancer Staging  ?Malignant neoplasm of lower-inner quadrant of right breast of female, estrogen receptor positive ([place]) ?Staging form: Breast, AJCC 8th Edition ?- Clinical: Stage IIIB (cT2, cN1, cM0, G3, ER-, PR-, HER2-) - Signed by Chauncey Cruel, MD on 01/18/2021 ?Stage prefix: Initial diagnosis ?Histologic grading system: 3 grade system ? ? ?SUMMARY OF ONCOLOGIC HISTORY: ?Oncology History  ?Malignant neoplasm of lower-inner quadrant of right breast of female, estrogen receptor positive ([name])  ?01/06/2021 Initial Diagnosis  ? 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            (a) chest CT scan 01/29/2021 shows no evidence of metastatic disease ?            (b) bone scan 02/01/2021 shows no evidence of metastatic disease ?  ?01/18/2021 Cancer Staging  ? Staging form: Breast, AJCC 8th Edition ?- Clinical: Stage IIIB (cT2, cN1, cM0, G3, ER-, PR-, HER2-) - Signed by Chauncey Cruel, MD on 01/18/2021 ?Stage prefix: Initial diagnosis ?Histologic grading system: 3 grade system ? ?  ?01/31/2021 Genetic Testing  ? PNegative hereditary cancer genetic testing: no pathogenic variants detected in [facility] CancerNext-Expanded + RNAinsight Panel.  The report date is January 31, 2021.  The CancerNext-Expanded gene panel offered by [facility] and includes sequencing, rearrangement, and RNA analysis for the following 77 genes: AIP, ALK, APC, ATM, AXIN2, BAP1, BARD1, BLM, BMPR1A, BRCA1, BRCA2, BRIP1, CDC73, CDH1, CDK4, CDKN1B, CDKN2A, CHEK2, CTNNA1, DICER1, FANCC, FH, FLCN, GALNT12, KIF1B, LZTR1, MAX, MEN1, MET, MLH1, MSH2, MSH3, MSH6, MUTYH, NBN, NF1, NF2, NTHL1, PALB2, PHOX2B, PMS2, POT1, PRKAR1A, PTCH1, PTEN, RAD51C, RAD51D, RB1,  RECQL, RET, SDHA, SDHAF2, SDHB, SDHC, SDHD, SMAD4, SMARCA4, SMARCB1, SMARCE1, STK11, SUFU, TMEM127, TP53, TSC1, TSC2, VHL and XRCC2 (sequencing and deletion/duplication); EGFR, EGLN1, HOXB13, KIT, MITF, PDGFRA, POLD1, and POLE (sequencing only); EPCAM and GREM1 (deletion/duplication only).  ?  ?02/07/2021 - 05/23/2021 Neo-Adjuvant Chemotherapy  ?  neoadjuvant chemotherapy consisting of pembrolizumab given day 1 together with carboplatin and paclitaxel, with Taxol given on day 8 and 15, and filgrastim on days 16,17 and 18, started 02/07/2021, repeated every 21 days x 2, followed by doxorubicin and cyclophosphamide every 21 days x 4 also given with pembrolizumab on day 1 and PEG filgrastim day 3, started 03/22/2021 and completed 05/23/2021 ?            (a) carboplatin/paclitaxel discontinued after the cycle 2-day 8 dose with neuropathy developing (subsequently resolved) ?  ?06/13/2021 -  Chemotherapy  ? Patient is on Treatment Plan : HEAD/NECK Pembrolizumab Q21D  ?   ?07/11/2021 Surgery  ? right lumpectomy and sentinel lymph node sampling 07/11/2021 showed a complete pathologic response (ypT0 ypN0) ?            (a) a total of 3 right axillary lymph nodes were removed ?  ?08/16/2021 - 09/29/2021 Radiation Therapy  ? 08/16/2021 through 09/29/2021 ?Site Technique Total Dose (Gy) Dose per Fx (Gy) Completed Fx Beam Energies  ?Breast, Right: Breast_Rt_IMN 3D 50/50 2 25/25 10X  ?Breast, Right: Breast_Rt_PAB_SCV 3D 50/50 2 25/25 10X, 15X  ?Breast, Right: Breast_Rt_Bst 3D 10/10 2 5/5 6X, 10X  ?  ? ? ?CURRENT THERAPY: Keytruda ? ?INTERVAL HISTORY: ?Rebecca [name] 56 y.o.  female returns for evaluation prior to receiving maintenance Keytruda.  She is accompanied by arabic interpreter  She is doing moderately well today.  She notes hardness in her right breast that has worsened over the past 2-3 days.  She also has stiffness and pain in her shoulder when she tries to move her right arm.     Her main concern is range of motion  difficulty in her right arm.  Physical therapy has not improved at this point.   ? ?Her blood glucose is 295 today. ? ? ?Patient Active Problem List  ? Diagnosis Date Noted  ? Arthralgia 12/11/2021  ? Right otitis media 09/26/2021  ? Port-A-Cath in place 02/07/2021  ? Genetic testing 02/02/2021  ? Family history of throat cancer 01/18/2021  ? Family history of lymphoma 01/18/2021  ? Malignant neoplasm of lower-inner quadrant of right breast of female, estrogen receptor positive ([place]) 01/12/2021  ? Posterior tibial tendinitis, right leg 01/26/2020  ? Diabetes ([place]) 03/11/2017  ? Muscle pain 08/06/2013  ? Tiredness 08/06/2013  ? Candida vaginitis 08/19/2012  ? Dental cavities 08/19/2012  ? Knee pain, bilateral 04/16/2012  ? Diabetes mellitus type 2, controlled ([place]) 04/16/2012  ? Hyperlipidemia 04/16/2012  ? Palpitations 04/16/2012  ? Back pain 04/16/2012  ? ? ?is allergic to lisinopril and heparin sod (pork) lock flush. ? ?MEDICAL HISTORY: ?Past Medical History:  ?Diagnosis Date  ? Arthritis   ? Breast cancer ([place])   ? Diabetes mellitus   ? Family history of lymphoma 01/18/2021  ? Family history of throat cancer 01/18/2021  ? Hyperlipidemia   ? Hypertension   ? ? ?SURGICAL HISTORY: ?Past Surgical History:  ?Procedure Laterality Date  ? BREAST LUMPECTOMY WITH RADIOACTIVE SEED AND SENTINEL LYMPH NODE BIOPSY Right 07/11/2021  ? Procedure: RIGHT BREAST LUMPECTOMY WITH RADIOACTIVE SEED AND RIGHT SENTINEL LYMPH NODE BIOPSY;  Surgeon: Erroll Luna, MD;  Location: [facility];  Service: General;  Laterality: Right;  ? CESAREAN SECTION    ? PORTACATH PLACEMENT N/A 01/26/2021  ? Procedure: INSERTION PORT-A-CATH;  Surgeon: Erroll Luna, MD;  Location: [facility];  Service: General;  Laterality: N/A;  60  ? RADIOACTIVE SEED GUIDED AXILLARY SENTINEL LYMPH NODE Right 07/11/2021  ? Procedure: RADIOACTIVE SEED GUIDED RIGHT AXILLARY LYMPH NODE BIOPSY;  Surgeon: Erroll Luna, MD;  Location: [facility];   Service: General;  Laterality: Right;  ? ? ?SOCIAL HISTORY: ?Social History  ? ?Socioeconomic History  ? Marital status: Divorced  ?  Spouse name: Not on file  ? Number of children: Not on file  ? Years of education: Not on file  ? Highest education level: Not on file  ?Occupational History  ? Not on file  ?Tobacco Use  ? Smoking status: Never  ?  Passive exposure: Yes  ? Smokeless tobacco: Never  ?Vaping Use  ? Vaping Use: Never used  ?Substance and Sexual Activity  ? Alcohol use: No  ? Drug use: No  ? Sexual activity: Not Currently  ?Other Topics Concern  ? Not on file  ?Social History Narrative  ? Not on file  ? ?Social Determinants of Health  ? ?Financial Resource Strain: Not on file  ?Food Insecurity: Not on file  ?Transportation Needs: No Transportation Needs  ? Lack of Transportation (Medical): No  ? Lack of Transportation (Non-Medical): No  ?Physical Activity: Not on file  ?Stress: Not on file  ?Social Connections: Not on file  ?Intimate Partner Violence: Not on file  ? ? ?FAMILY HISTORY: ?  Family History  ?Problem Relation Age of Onset  ? Diabetes Mother   ? Heart disease Mother   ? Diabetes Father   ? Throat cancer Father   ?     d. 52s  ? Diabetes Sister   ? Diabetes Brother   ? Lymphoma Cousin   ?     maternal cousin; d. 35s  ? ? ?Review of Systems  ?Constitutional:  Negative for appetite change, chills, fatigue, fever and unexpected weight change.  ?HENT:   Negative for hearing loss, lump/mass and trouble swallowing.   ?Eyes:  Negative for eye problems and icterus.  ?Respiratory:  Negative for chest tightness, cough and shortness of breath.   ?Cardiovascular:  Negative for chest pain, leg swelling and palpitations.  ?Gastrointestinal:  Negative for abdominal distention, abdominal pain, constipation, diarrhea, nausea and vomiting.  ?Endocrine: Negative for hot flashes.  ?Genitourinary:  Negative for difficulty urinating.   ?Musculoskeletal:  Negative for arthralgias.  ?Skin:  Negative for itching and  rash.  ?Neurological:  Negative for dizziness, extremity weakness, headaches and numbness.  ?Hematological:  Negative for adenopathy. Does not bruise/bleed easily.  ?Psychiatric/Behavioral:  Negative for depressio

## 2022-01-08 NOTE — Assessment & Plan Note (Addendum)
Rebecca [name] has a history of stage IIIb triple negative breast cancer, s/p neoadjuvant chemotherapy, right lumpectomy, adjuvant radiation, and she continues on maintenance pembrolizumab. ? ?Pembrolizumab planned x 9-10 cycles ? ?Chemo toxicities:  ?1. Arthralgias ?2. Right shoulder pain and ROM difficulty, breast swelling: obtaining mammogram and axillary ultrasound.  If negative will likely refer to sports medicine for further evaluation.   ? ? ? ? ? ? ?

## 2022-01-17 ENCOUNTER — Ambulatory Visit: Payer: 59 | Admitting: Family Medicine

## 2022-01-18 ENCOUNTER — Other Ambulatory Visit: Payer: Self-pay | Admitting: Physician Assistant

## 2022-01-18 DIAGNOSIS — H65191 Other acute nonsuppurative otitis media, right ear: Secondary | ICD-10-CM

## 2022-01-23 ENCOUNTER — Ambulatory Visit: Payer: 59

## 2022-01-23 ENCOUNTER — Telehealth: Payer: Self-pay

## 2022-01-23 ENCOUNTER — Other Ambulatory Visit: Payer: 59

## 2022-01-23 ENCOUNTER — Encounter: Payer: Self-pay | Admitting: *Deleted

## 2022-01-23 NOTE — Telephone Encounter (Signed)
Called pt, left VM regarding her MM - we are working to find a location that will accept her insurance and will notify her once scheduled.  Advised to call back with questions or concerns  ?

## 2022-01-28 NOTE — Assessment & Plan Note (Deleted)
Rebecca [name] has a history of stage IIIb triple negative breast cancer.  She continues on maintenance pembrolizumab. ?She is tolerating it well except for some generalized arthralgias, myalgias as well as fatigue.  Physical examination today without any major concerns.  Okay to proceed with Keytruda, CBC and CMP reviewed and satisfactory.   ?She will receive a total of 9 cycles adjuvant, last cycle is likely to happen on 01/22/2022. ?Post immunotherapy, she will have to continue diagnostic mammograms for both breasts for about 5 years at least and then she can go onto screening mammogram after that. ?

## 2022-01-28 NOTE — Progress Notes (Deleted)
[facility] Follow up: ?  ? ?[name], MD ?[address] ?[place] [address] ? ? ?DIAGNOSIS:  Cancer Staging  ?Malignant neoplasm of lower-inner quadrant of right breast of female, estrogen receptor positive ([place]) ?Staging form: Breast, AJCC 8th Edition ?- Clinical: Stage IIIB (cT2, cN1, cM0, G3, ER-, PR-, HER2-) - Signed by Chauncey Cruel, MD on 01/18/2021 ?Stage prefix: Initial diagnosis ?Histologic grading system: 3 grade system ? ? ?SUMMARY OF ONCOLOGIC HISTORY: ?Oncology History  ?Malignant neoplasm of lower-inner quadrant of right breast of female, estrogen receptor positive ([place])  ?01/06/2021 Initial Diagnosis  ? 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            (a) chest CT scan 01/29/2021 shows no evidence of metastatic disease ?            (b) bone scan 02/01/2021 shows no evidence of metastatic disease ?  ?01/18/2021 Cancer Staging  ? Staging form: Breast, AJCC 8th Edition ?- Clinical: Stage IIIB (cT2, cN1, cM0, G3, ER-, PR-, HER2-) - Signed by Chauncey Cruel, MD on 01/18/2021 ?Stage prefix: Initial diagnosis ?Histologic grading system: 3 grade system ? ?  ?01/31/2021 Genetic Testing  ? PNegative hereditary cancer genetic testing: no pathogenic variants detected in [facility] CancerNext-Expanded + RNAinsight Panel.  The report date is January 31, 2021.  The CancerNext-Expanded gene panel offered by [facility] and includes sequencing, rearrangement, and RNA analysis for the following 77 genes: AIP, ALK, APC, ATM, AXIN2, BAP1, BARD1, BLM, BMPR1A, BRCA1, BRCA2, BRIP1, CDC73, CDH1, CDK4, CDKN1B, CDKN2A, CHEK2, CTNNA1, DICER1, FANCC, FH, FLCN, GALNT12, KIF1B, LZTR1, MAX, MEN1, MET, MLH1, MSH2, MSH3, MSH6, MUTYH, NBN, NF1, NF2, NTHL1, PALB2, PHOX2B, PMS2, POT1, PRKAR1A, PTCH1, PTEN, RAD51C, RAD51D, RB1,  RECQL, RET, SDHA, SDHAF2, SDHB, SDHC, SDHD, SMAD4, SMARCA4, SMARCB1, SMARCE1, STK11, SUFU, TMEM127, TP53, TSC1, TSC2, VHL and XRCC2 (sequencing and deletion/duplication); EGFR, EGLN1, HOXB13, KIT, MITF, PDGFRA, POLD1, and POLE (sequencing only); EPCAM and GREM1 (deletion/duplication only).  ?  ?02/07/2021 - 05/23/2021 Neo-Adjuvant Chemotherapy  ?  neoadjuvant chemotherapy consisting of pembrolizumab given day 1 together with carboplatin and paclitaxel, with Taxol given on day 8 and 15, and filgrastim on days 16,17 and 18, started 02/07/2021, repeated every 21 days x 2, followed by doxorubicin and cyclophosphamide every 21 days x 4 also given with pembrolizumab on day 1 and PEG filgrastim day 3, started 03/22/2021 and completed 05/23/2021 ?            (a) carboplatin/paclitaxel discontinued after the cycle 2-day 8 dose with neuropathy developing (subsequently resolved) ?  ?06/13/2021 -  Chemotherapy  ? Patient is on Treatment Plan : HEAD/NECK Pembrolizumab Q21D  ?   ?07/11/2021 Surgery  ? right lumpectomy and sentinel lymph node sampling 07/11/2021 showed a complete pathologic response (ypT0 ypN0) ?            (a) a total of 3 right axillary lymph nodes were removed ?  ?08/16/2021 - 09/29/2021 Radiation Therapy  ? 08/16/2021 through 09/29/2021 ?Site Technique Total Dose (Gy) Dose per Fx (Gy) Completed Fx Beam Energies  ?Breast, Right: Breast_Rt_IMN 3D 50/50 2 25/25 10X  ?Breast, Right: Breast_Rt_PAB_SCV 3D 50/50 2 25/25 10X, 15X  ?Breast, Right: Breast_Rt_Bst 3D 10/10 2 5/5 6X, 10X  ?  ? ? ?CURRENT THERAPY: Keytruda ? ?INTERVAL HISTORY: ? ?Rebecca [name] 55  y.o. female returns for evaluation prior to receiving maintenance Keytruda.   ? ?Patient Active Problem List  ? Diagnosis Date Noted  ? Arthralgia 12/11/2021  ? Right otitis media 09/26/2021  ? Port-A-Cath in place 02/07/2021  ? Genetic testing 02/02/2021  ? Family history of throat cancer 01/18/2021  ? Family history of lymphoma 01/18/2021  ? Malignant neoplasm of  lower-inner quadrant of right breast of female, estrogen receptor positive ([place]) 01/12/2021  ? Posterior tibial tendinitis, right leg 01/26/2020  ? Diabetes ([place]) 03/11/2017  ? Muscle pain 08/06/2013  ? Tiredness 08/06/2013  ? Candida vaginitis 08/19/2012  ? Dental cavities 08/19/2012  ? Knee pain, bilateral 04/16/2012  ? Diabetes mellitus type 2, controlled ([place]) 04/16/2012  ? Hyperlipidemia 04/16/2012  ? Palpitations 04/16/2012  ? Back pain 04/16/2012  ? ? ?is allergic to lisinopril and heparin sod (pork) lock flush. ? ?MEDICAL HISTORY: ?Past Medical History:  ?Diagnosis Date  ? Arthritis   ? Breast cancer ([place])   ? Diabetes mellitus   ? Family history of lymphoma 01/18/2021  ? Family history of throat cancer 01/18/2021  ? Hyperlipidemia   ? Hypertension   ? ? ?SURGICAL HISTORY: ?Past Surgical History:  ?Procedure Laterality Date  ? BREAST LUMPECTOMY WITH RADIOACTIVE SEED AND SENTINEL LYMPH NODE BIOPSY Right 07/11/2021  ? Procedure: RIGHT BREAST LUMPECTOMY WITH RADIOACTIVE SEED AND RIGHT SENTINEL LYMPH NODE BIOPSY;  Surgeon: Erroll Luna, MD;  Location: [facility];  Service: General;  Laterality: Right;  ? CESAREAN SECTION    ? PORTACATH PLACEMENT N/A 01/26/2021  ? Procedure: INSERTION PORT-A-CATH;  Surgeon: Erroll Luna, MD;  Location: [facility];  Service: General;  Laterality: N/A;  60  ? RADIOACTIVE SEED GUIDED AXILLARY SENTINEL LYMPH NODE Right 07/11/2021  ? Procedure: RADIOACTIVE SEED GUIDED RIGHT AXILLARY LYMPH NODE BIOPSY;  Surgeon: Erroll Luna, MD;  Location: [facility];  Service: General;  Laterality: Right;  ? ? ?SOCIAL HISTORY: ?Social History  ? ?Socioeconomic History  ? Marital status: Divorced  ?  Spouse name: Not on file  ? Number of children: Not on file  ? Years of education: Not on file  ? Highest education level: Not on file  ?Occupational History  ? Not on file  ?Tobacco Use  ? Smoking status: Never  ?  Passive exposure: Yes  ? Smokeless tobacco: Never  ?Vaping Use   ? Vaping Use: Never used  ?Substance and Sexual Activity  ? Alcohol use: No  ? Drug use: No  ? Sexual activity: Not Currently  ?Other Topics Concern  ? Not on file  ?Social History Narrative  ? Not on file  ? ?Social Determinants of Health  ? ?Financial Resource Strain: Not on file  ?Food Insecurity: Not on file  ?Transportation Needs: Not on file  ?Physical Activity: Not on file  ?Stress: Not on file  ?Social Connections: Not on file  ?Intimate Partner Violence: Not on file  ? ? ?FAMILY HISTORY: ?Family History  ?Problem Relation Age of Onset  ? Diabetes Mother   ? Heart disease Mother   ? Diabetes Father   ? Throat cancer Father   ?     d. 70s  ? Diabetes Sister   ? Diabetes Brother   ? Lymphoma Cousin   ?     maternal cousin; d. 69s  ? ? ?Review of Systems  ?Constitutional:  Negative for appetite change, chills, fatigue, fever and unexpected weight change.  ?HENT:   Negative for hearing loss, lump/mass and trouble  swallowing.   ?Eyes:  Negative for eye problems and icterus.  ?Respiratory:  Negative for chest tightness, cough and shortness of breath.   ?Cardiovascular:  Negative for chest pain, leg swelling and palpitations.  ?Gastrointestinal:  Negative for abdominal distention, abdominal pain, constipation, diarrhea, nausea and vomiting.  ?Endocrine: Negative for hot flashes.  ?Genitourinary:  Negative for difficulty urinating.   ?Musculoskeletal:  Negative for arthralgias.  ?Skin:  Negative for itching and rash.  ?Neurological:  Negative for dizziness, extremity weakness, headaches and numbness.  ?Hematological:  Negative for adenopathy. Does not bruise/bleed easily.  ?Psychiatric/Behavioral:  Negative for depression. The patient is not nervous/anxious.    ? ? ?PHYSICAL EXAMINATION ? ?ECOG PERFORMANCE STATUS: 1 - Symptomatic but completely ambulatory ? ?There were no vitals filed for this visit. ? ? ?Physical Exam ?Constitutional:   ?   General: She is not in acute distress. ?   Appearance: Normal appearance.  She is not toxic-appearing.  ?HENT:  ?   Head: Normocephalic and atraumatic.  ?Eyes:  ?   General: No scleral icterus. ?Cardiovascular:  ?   Rate and Rhythm: Normal rate and regular rhythm.  ?   Pul

## 2022-01-29 ENCOUNTER — Inpatient Hospital Stay: Payer: 59

## 2022-01-29 ENCOUNTER — Encounter: Payer: Self-pay | Admitting: *Deleted

## 2022-01-29 ENCOUNTER — Inpatient Hospital Stay: Payer: 59 | Admitting: Hematology and Oncology

## 2022-01-29 DIAGNOSIS — Z170 Estrogen receptor positive status [ER+]: Secondary | ICD-10-CM

## 2022-01-29 DIAGNOSIS — C50311 Malignant neoplasm of lower-inner quadrant of right female breast: Secondary | ICD-10-CM

## 2022-01-30 ENCOUNTER — Inpatient Hospital Stay: Payer: 59

## 2022-01-30 ENCOUNTER — Inpatient Hospital Stay: Payer: 59 | Attending: Hematology and Oncology | Admitting: Hematology and Oncology

## 2022-01-30 ENCOUNTER — Encounter: Payer: Self-pay | Admitting: Hematology and Oncology

## 2022-01-30 VITALS — BP 132/70 | HR 89 | Temp 98.2°F | Resp 18 | Wt 148.6 lb

## 2022-01-30 DIAGNOSIS — R079 Chest pain, unspecified: Secondary | ICD-10-CM | POA: Diagnosis not present

## 2022-01-30 DIAGNOSIS — C50311 Malignant neoplasm of lower-inner quadrant of right female breast: Secondary | ICD-10-CM | POA: Diagnosis not present

## 2022-01-30 DIAGNOSIS — Z170 Estrogen receptor positive status [ER+]: Secondary | ICD-10-CM

## 2022-01-30 DIAGNOSIS — M25519 Pain in unspecified shoulder: Secondary | ICD-10-CM

## 2022-01-30 DIAGNOSIS — Z79899 Other long term (current) drug therapy: Secondary | ICD-10-CM | POA: Diagnosis not present

## 2022-01-30 DIAGNOSIS — Z5112 Encounter for antineoplastic immunotherapy: Secondary | ICD-10-CM | POA: Diagnosis not present

## 2022-01-30 DIAGNOSIS — Z95828 Presence of other vascular implants and grafts: Secondary | ICD-10-CM

## 2022-01-30 LAB — CMP (CANCER CENTER ONLY)
ALT: 25 U/L (ref 0–44)
AST: 26 U/L (ref 15–41)
Albumin: 4.1 g/dL (ref 3.5–5.0)
Alkaline Phosphatase: 79 U/L (ref 38–126)
Anion gap: 8 (ref 5–15)
BUN: 12 mg/dL (ref 6–20)
CO2: 31 mmol/L (ref 22–32)
Calcium: 9.3 mg/dL (ref 8.9–10.3)
Chloride: 99 mmol/L (ref 98–111)
Creatinine: 0.67 mg/dL (ref 0.44–1.00)
GFR, Estimated: >60 mL/min (ref >60)
Glucose, Bld: 245 mg/dL — ABNORMAL HIGH (ref 70–99)
Potassium: 4.1 mmol/L (ref 3.5–5.1)
Sodium: 138 mmol/L (ref 135–145)
Total Bilirubin: 0.3 mg/dL (ref 0.3–1.2)
Total Protein: 7.1 g/dL (ref 6.5–8.1)

## 2022-01-30 LAB — CBC WITH DIFFERENTIAL (CANCER CENTER ONLY)
Abs Immature Granulocytes: 0.02 10*3/uL (ref 0.00–0.07)
Basophils Absolute: 0 10*3/uL (ref 0.0–0.1)
Basophils Relative: 1 %
Eosinophils Absolute: 0.2 10*3/uL (ref 0.0–0.5)
Eosinophils Relative: 4 %
HCT: 42.4 % (ref 36.0–46.0)
Hemoglobin: 13.5 g/dL (ref 12.0–15.0)
Immature Granulocytes: 0 %
Lymphocytes Relative: 24 %
Lymphs Abs: 1.1 10*3/uL (ref 0.7–4.0)
MCH: 25.7 pg — ABNORMAL LOW (ref 26.0–34.0)
MCHC: 31.8 g/dL (ref 30.0–36.0)
MCV: 80.8 fL (ref 80.0–100.0)
Monocytes Absolute: 0.5 10*3/uL (ref 0.1–1.0)
Monocytes Relative: 10 %
Neutro Abs: 2.8 10*3/uL (ref 1.7–7.7)
Neutrophils Relative %: 61 %
Platelet Count: 232 10*3/uL (ref 150–400)
RBC: 5.25 MIL/uL — ABNORMAL HIGH (ref 3.87–5.11)
RDW: 14.5 % (ref 11.5–15.5)
WBC Count: 4.5 10*3/uL (ref 4.0–10.5)
nRBC: 0 % (ref 0.0–0.2)

## 2022-01-30 MED ORDER — ALTEPLASE 2 MG IJ SOLR
2.0000 mg | Freq: Once | INTRAMUSCULAR | Status: AC
  Administered 2022-01-30: 2 mg
  Filled 2022-01-30: qty 2

## 2022-01-30 MED ORDER — SODIUM CHLORIDE 0.9% FLUSH
10.0000 mL | Freq: Once | INTRAVENOUS | Status: AC
  Administered 2022-01-30: 10 mL

## 2022-01-30 MED ORDER — SODIUM CHLORIDE 0.9 % IV SOLN
200.0000 mg | Freq: Once | INTRAVENOUS | Status: AC
  Administered 2022-01-30: 200 mg via INTRAVENOUS
  Filled 2022-01-30: qty 200

## 2022-01-30 MED ORDER — SODIUM CHLORIDE 0.9 % IV SOLN: Freq: Once | INTRAVENOUS | Status: AC

## 2022-01-30 NOTE — Patient Instructions (Signed)
[name]  ° Discharge Instructions: °Thank you for choosing [name] to provide your oncology and hematology care.  ° °If you have a lab appointment with the [name], please go directly to the [name] and check in at the registration area. °  °Wear comfortable clothing and clothing appropriate for easy access to any Portacath or PICC line.  ° °We strive to give you quality time with your provider. You may need to reschedule your appointment if you arrive late (15 or more minutes).  Arriving late affects you and other patients whose appointments are after yours.  Also, if you miss three or more appointments without notifying the office, you may be dismissed from the clinic at the provider’s discretion.    °  °For prescription refill requests, have your pharmacy contact our office and allow 72 hours for refills to be completed.   ° °Today you received the following chemotherapy and/or immunotherapy agents: Pembrolizumab (Keytruda) °  °To help prevent nausea and vomiting after your treatment, we encourage you to take your nausea medication as directed. ° °BELOW ARE SYMPTOMS THAT SHOULD BE REPORTED IMMEDIATELY: °*FEVER GREATER THAN 100.4 F (38 °C) OR HIGHER °*CHILLS OR SWEATING °*NAUSEA AND VOMITING THAT IS NOT CONTROLLED WITH YOUR NAUSEA MEDICATION °*UNUSUAL SHORTNESS OF BREATH °*UNUSUAL BRUISING OR BLEEDING °*URINARY PROBLEMS (pain or burning when urinating, or frequent urination) °*BOWEL PROBLEMS (unusual diarrhea, constipation, pain near the anus) °TENDERNESS IN MOUTH AND THROAT WITH OR WITHOUT PRESENCE OF ULCERS (sore throat, sores in mouth, or a toothache) °UNUSUAL RASH, SWELLING OR PAIN  °UNUSUAL VAGINAL DISCHARGE OR ITCHING  ° °Items with * indicate a potential emergency and should be followed up as soon as possible or go to the Emergency Department if any problems should occur. ° °Please show the CHEMOTHERAPY ALERT CARD or IMMUNOTHERAPY ALERT CARD  at check-in to the Emergency Department and triage nurse. ° °Should you have questions after your visit or need to cancel or reschedule your appointment, please contact [name]  Dept: [phone]  and follow the prompts.  Office hours are 8:00 a.m. to 4:30 p.m. Monday - Friday. Please note that voicemails left after 4:00 p.m. may not be returned until the following business day.  We are closed weekends and major holidays. You have access to a nurse at all times for urgent questions. Please call the main number to the clinic Dept: [phone] and follow the prompts. ° ° °For any non-urgent questions, you may also contact your provider using MyChart. We now offer e-Visits for anyone 18 and older to request care online for non-urgent symptoms. For details visit [email or web address]. °  °Also download the MyChart app! Go to the app store, search "MyChart", open the app, select [facility], and log in with your MyChart username and password. ° °Due to Covid, a mask is required upon entering the hospital/clinic. If you do not have a mask, one will be given to you upon arrival. For doctor visits, patients may have 1 support person aged 18 or older with them. For treatment visits, patients cannot have anyone with them due to current Covid guidelines and our immunocompromised population.  ° °

## 2022-01-30 NOTE — Progress Notes (Signed)
[name]: ?  ? ?Rebecca Rakes, MD ?[place] ?[name] ? ? ?DIAGNOSIS:  Cancer Staging  ?Malignant neoplasm of lower-inner quadrant of right breast of female, estrogen receptor positive ([name]) ?Staging form: Breast, AJCC 8th Edition ?- Clinical: Stage IIIB (cT2, cN1, cM0, G3, ER-, PR-, HER2-) - Signed by Chauncey Cruel, MD on 01/18/2021 ?Stage prefix: Initial diagnosis ?Histologic grading system: 3 grade system ? ? ?SUMMARY OF ONCOLOGIC HISTORY: ?Oncology History  ?Malignant neoplasm of lower-inner quadrant of right breast of female, estrogen receptor positive ([name])  ?01/06/2021 Initial Diagnosis  ? 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            (Rebecca [name]) chest CT scan 01/29/2021 shows no evidence of metastatic disease ?            (b) bone scan 02/01/2021 shows no evidence of metastatic disease ?  ?01/18/2021 Cancer Staging  ? Staging form: Breast, AJCC 8th Edition ?- Clinical: Stage IIIB (cT2, cN1, cM0, G3, ER-, PR-, HER2-) - Signed by Chauncey Cruel, MD on 01/18/2021 ?Stage prefix: Initial diagnosis ?Histologic grading system: 3 grade system ? ?  ?01/31/2021 Genetic Testing  ? PNegative hereditary cancer genetic testing: no pathogenic variants detected in [facility] CancerNext-Expanded + RNAinsight Panel.  The report date is January 31, 2021.  The CancerNext-Expanded gene panel offered by [facility] and includes sequencing, rearrangement, and RNA analysis for the following 77 genes: AIP, ALK, APC, ATM, AXIN2, BAP1, BARD1, BLM, BMPR1A, BRCA1, BRCA2, BRIP1, CDC73, CDH1, CDK4, CDKN1B, CDKN2A, CHEK2, CTNNA1, DICER1, FANCC, FH, FLCN, GALNT12, KIF1B, LZTR1, MAX, MEN1, MET, MLH1, MSH2, MSH3, MSH6, MUTYH, NBN, NF1, NF2, NTHL1, PALB2, PHOX2B, PMS2, POT1, PRKAR1A, PTCH1, PTEN, RAD51C, RAD51D, RB1,  RECQL, RET, SDHA, SDHAF2, SDHB, SDHC, SDHD, SMAD4, SMARCA4, SMARCB1, SMARCE1, STK11, SUFU, TMEM127, TP53, TSC1, TSC2, VHL and XRCC2 (sequencing and deletion/duplication); EGFR, EGLN1, HOXB13, KIT, MITF, PDGFRA, POLD1, and POLE (sequencing only); EPCAM and GREM1 (deletion/duplication only).  ?  ?02/07/2021 - 05/23/2021 Neo-Adjuvant Chemotherapy  ?  neoadjuvant chemotherapy consisting of pembrolizumab given day 1 together with carboplatin and paclitaxel, with Taxol given on day 8 and 15, and filgrastim on days 16,17 and 18, started 02/07/2021, repeated every 21 days x 2, followed by doxorubicin and cyclophosphamide every 21 days x 4 also given with pembrolizumab on day 1 and PEG filgrastim day 3, started 03/22/2021 and completed 05/23/2021 ?            (Rebecca [name]) carboplatin/paclitaxel discontinued after the cycle 2-day 8 dose with neuropathy developing (subsequently resolved) ?  ?06/13/2021 -  Chemotherapy  ? Patient is on Treatment Plan : HEAD/NECK Pembrolizumab Q21D  ?   ?07/11/2021 Surgery  ? right lumpectomy and sentinel lymph node sampling 07/11/2021 showed Rebecca [name] complete pathologic response (ypT0 ypN0) ?            (Rebecca [name]) Rebecca [name] total of 3 right axillary lymph nodes were removed ?  ?08/16/2021 - 09/29/2021 Radiation Therapy  ? 08/16/2021 through 09/29/2021 ?Site Technique Total Dose (Gy) Dose per Fx (Gy) Completed Fx Beam Energies  ?Breast, Right: Breast_Rt_IMN 3D 50/50 2 25/25 10X  ?Breast, Right: Breast_Rt_PAB_SCV 3D 50/50 2 25/25 10X, 15X  ?Breast, Right: Breast_Rt_Bst 3D 10/10 2 5/5 6X, 10X  ?  ? ? ?CURRENT THERAPY: Keytruda ? ?INTERVAL HISTORY: ? ?Rebecca Rebecca [name] 56  y.o. female returns for evaluation prior to receiving maintenance Keytruda.   ?Today is her last cycle of planned immunotherapy.  She missed her physician appointment yesterday, she told me that she forgot about the appointment.  She is upset that she has been forgetting her appointments.  She otherwise also complained of arthralgias mostly in her shoulders and her  knees which have improved.  She has noticed some chest pain associated with some shortness of breath for the past week, lost Rebecca [name] dear friend to cancer and she was sobbing quite Rebecca [name] bit.  She otherwise denies any changes in bowel habits or urinary habits.  No change in breathing.  We used an Fish farm manager for the conversation. ?Rest of the pertinent 10 point ROS reviewed and negative ? ?Patient Active Problem List  ? Diagnosis Date Noted  ? Chest pain 01/30/2022  ? Arthralgia of shoulder 12/11/2021  ? Right otitis media 09/26/2021  ? Port-Rebecca [name]-Cath in place 02/07/2021  ? Genetic testing 02/02/2021  ? Family history of throat cancer 01/18/2021  ? Family history of lymphoma 01/18/2021  ? Malignant neoplasm of lower-inner quadrant of right breast of female, estrogen receptor positive ([place]) 01/12/2021  ? Posterior tibial tendinitis, right leg 01/26/2020  ? Diabetes ([name]) 03/11/2017  ? Muscle pain 08/06/2013  ? Tiredness 08/06/2013  ? Candida vaginitis 08/19/2012  ? Dental cavities 08/19/2012  ? Knee pain, bilateral 04/16/2012  ? Diabetes mellitus type 2, controlled ([name]) 04/16/2012  ? Hyperlipidemia 04/16/2012  ? Palpitations 04/16/2012  ? Back pain 04/16/2012  ? ? ?is allergic to lisinopril and heparin sod (pork) lock flush. ? ?MEDICAL HISTORY: ?Past Medical History:  ?Diagnosis Date  ? Arthritis   ? Breast cancer ([name])   ? Diabetes mellitus   ? Family history of lymphoma 01/18/2021  ? Family history of throat cancer 01/18/2021  ? Hyperlipidemia   ? Hypertension   ? ? ?SURGICAL HISTORY: ?Past Surgical History:  ?Procedure Laterality Date  ? BREAST LUMPECTOMY WITH RADIOACTIVE SEED AND SENTINEL LYMPH NODE BIOPSY Right 07/11/2021  ? Procedure: RIGHT BREAST LUMPECTOMY WITH RADIOACTIVE SEED AND RIGHT SENTINEL LYMPH NODE BIOPSY;  Surgeon: Erroll Luna, MD;  Location: [facility];  Service: General;  Laterality: Right;  ? CESAREAN SECTION    ? PORTACATH PLACEMENT N/Rebecca [name] 01/26/2021  ? Procedure: INSERTION PORT-Rebecca [name]-CATH;   Surgeon: Erroll Luna, MD;  Location: [facility];  Service: General;  Laterality: N/Rebecca [name];  62  ? RADIOACTIVE SEED GUIDED AXILLARY SENTINEL LYMPH NODE Right 07/11/2021  ? Procedure: RADIOACTIVE SEED GUIDED RIGHT AXILLARY LYMPH NODE BIOPSY;  Surgeon: Erroll Luna, MD;  Location: [facility];  Service: General;  Laterality: Right;  ? ? ?SOCIAL HISTORY: ?Social History  ? ?Socioeconomic History  ? Marital status: Divorced  ?  Spouse name: Not on file  ? Number of children: Not on file  ? Years of education: Not on file  ? Highest education level: Not on file  ?Occupational History  ? Not on file  ?Tobacco Use  ? Smoking status: Never  ?  Passive exposure: Yes  ? Smokeless tobacco: Never  ?Vaping Use  ? Vaping Use: Never used  ?Substance and Sexual Activity  ? Alcohol use: No  ? Drug use: No  ? Sexual activity: Not Currently  ?Other Topics Concern  ? Not on file  ?Social History Narrative  ? Not on file  ? ?Social Determinants of Health  ? ?Financial Resource Strain: Not on file  ?Food Insecurity: Not on file  ?Transportation Needs: Not  on file  ?Physical Activity: Not on file  ?Stress: Not on file  ?Social Connections: Not on file  ?Intimate Partner Violence: Not on file  ? ? ?FAMILY HISTORY: ?Family History  ?Problem Relation Age of Onset  ? Diabetes Mother   ? Heart disease Mother   ? Diabetes Father   ? Throat cancer Father   ?     d. 71s  ? Diabetes Sister   ? Diabetes Brother   ? Lymphoma Cousin   ?     maternal cousin; d. 15s  ? ? ?Review of Systems  ?Constitutional:  Negative for appetite change, chills, fatigue, fever and unexpected weight change.  ?HENT:   Negative for hearing loss, lump/mass and trouble swallowing.   ?Eyes:  Negative for eye problems and icterus.  ?Respiratory:  Negative for chest tightness, cough and shortness of breath.   ?Cardiovascular:  Negative for chest pain, leg swelling and palpitations.  ?Gastrointestinal:  Negative for abdominal distention, abdominal pain, constipation,  diarrhea, nausea and vomiting.  ?Endocrine: Negative for hot flashes.  ?Genitourinary:  Negative for difficulty urinating.   ?Musculoskeletal:  Negative for arthralgias.  ?Skin:  Negative for itching an

## 2022-01-30 NOTE — Assessment & Plan Note (Signed)
She describes arthralgias of both her shoulders, this could indeed be related to immunotherapy however appears to be mild.  If this is indeed related to immunotherapy, we expected to be improving after her completion of immunotherapy.  If it does not improve after cessation of immunotherapy, she should be seen by orthopedics for further evaluation ?

## 2022-01-30 NOTE — Assessment & Plan Note (Signed)
Patient has a history of stage IIIb triple negative breast cancer.   ?She continues on maintenance pembrolizumab. ?She is tolerating it well except for some generalized arthralgias, myalgias as well as fatigue.  Physical examination today without any major concerns.  Okay to proceed with Keytruda, CBC and CMP reviewed and satisfactory.   ?She will complete immunotherapy today. ?Post immunotherapy, she will have to continue diagnostic mammograms for both breasts for about 5 years at least and then she can go onto screening mammogram after that. ?She tried to tell me that her mammograms were not covered by her insurance, advised her to call her insurance since this is standard of care after treatment for breast cancer.  She expressed understanding ?

## 2022-01-30 NOTE — Assessment & Plan Note (Signed)
Chest pain, associated with some shortness of breath for the past week.  I encouraged her to talk to her PCP about further evaluation for cardiac chest pain. ?She expressed understanding ?

## 2022-02-07 ENCOUNTER — Encounter (HOSPITAL_COMMUNITY): Payer: Self-pay

## 2022-02-11 ENCOUNTER — Other Ambulatory Visit: Payer: Self-pay | Admitting: Family Medicine

## 2022-02-11 DIAGNOSIS — E119 Type 2 diabetes mellitus without complications: Secondary | ICD-10-CM

## 2022-02-13 ENCOUNTER — Other Ambulatory Visit: Payer: Self-pay | Admitting: Family Medicine

## 2022-02-13 DIAGNOSIS — E119 Type 2 diabetes mellitus without complications: Secondary | ICD-10-CM

## 2022-02-13 NOTE — Telephone Encounter (Signed)
Requested Prescriptions  ?Pending Prescriptions Disp Refills  ?? glipiZIDE (GLUCOTROL) 5 MG tablet [Pharmacy Med Name: GLIPIZIDE 5MG TABLETS] 60 tablet 0  ?  Sig: TAKE 1 TABLET BY MOUTH DAILY  ?  ? Endocrinology:  Diabetes - Sulfonylureas Failed - 02/11/2022 12:03 PM  ?  ?  Failed - HBA1C is between 0 and 7.9 and within 180 days  ?  HbA1c, POC (controlled diabetic range)  ?Date Value Ref Range Status  ?06/29/2021 7.5 (A) 0.0 - 7.0 % Final  ?   ?  ?  Passed - Cr in normal range and within 360 days  ?  Creatinine  ?Date Value Ref Range Status  ?01/30/2022 0.67 0.44 - 1.00 mg/dL Final  ? ?Creat  ?Date Value Ref Range Status  ?11/02/2016 0.50 0.50 - 1.10 mg/dL Final  ? ?Creatinine, Urine  ?Date Value Ref Range Status  ?11/02/2016 141 20 - 320 mg/dL Final  ?   ?  ?  Passed - Valid encounter within last 6 months  ?  Recent Outpatient Visits   ?      ? 4 months ago Type 2 diabetes mellitus without complication, without long-term current use of insulin ([place])  ? [place], [place], [place]  ? 5 months ago Other acute nonsuppurative otitis media of right ear, recurrence not specified  ? [place] Ladell Pier, MD  ? 7 months ago Type 2 diabetes mellitus without complication, without long-term current use of insulin ([place])  ? [place] [name], MD  ? 1 year ago Annual physical exam  ? [place] [name], MD  ? 1 year ago Encounter for screening mammogram for malignant neoplasm of breast  ? Primary Care at [facility], [place] J, NP  ?  ?  ? ?  ?  ?  ?? metFORMIN (GLUCOPHAGE) 500 MG tablet [Pharmacy Med Name: METFORMIN 500MG TABLETS] 120 tablet 1  ?  Sig: TAKE 2 TABLETS(1000 MG) BY MOUTH TWICE DAILY  ?  ? Endocrinology:  Diabetes - Biguanides Failed - 02/11/2022 12:03 PM  ?  ?  Failed - HBA1C is between 0 and 7.9 and within 180 days  ?  HbA1c, POC (controlled diabetic  range)  ?Date Value Ref Range Status  ?06/29/2021 7.5 (A) 0.0 - 7.0 % Final  ?   ?  ?  Failed - B12 Level in normal range and within 720 days  ?  No results found for: VITAMINB12   ?  ?  Passed - Cr in normal range and within 360 days  ?  Creatinine  ?Date Value Ref Range Status  ?01/30/2022 0.67 0.44 - 1.00 mg/dL Final  ? ?Creat  ?Date Value Ref Range Status  ?11/02/2016 0.50 0.50 - 1.10 mg/dL Final  ? ?Creatinine, Urine  ?Date Value Ref Range Status  ?11/02/2016 141 20 - 320 mg/dL Final  ?   ?  ?  Passed - eGFR in normal range and within 360 days  ?  GFR, Est African American  ?Date Value Ref Range Status  ?01/16/2016 >89 >=60 mL/min Final  ? ?GFR calc Af Wyvonnia Lora  ?Date Value Ref Range Status  ?06/02/2020 118 >59 mL/min/1.73 Final  ?  Comment:  ?  **[facility] currently reports eGFR in compliance with the current** ?  recommendations of the [employer]. [facility] will ?  update reporting as new guidelines are published from the  NKF-ASN ?  Task force. ?  ? ?GFR, Est Non African American  ?Date Value Ref Range Status  ?01/16/2016 >89 >=60 mL/min Final  ?  Comment:  ?    ?The estimated GFR is a calculation valid for adults (>=24 years old) ?that uses the CKD-EPI algorithm to adjust for age and sex. It is   ?not to be used for children, pregnant women, hospitalized patients,    ?patients on dialysis, or with rapidly changing kidney function. ?According to the NKDEP, eGFR >89 is normal, 60-89 shows mild ?impairment, 30-59 shows moderate impairment, 15-29 shows severe ?impairment and <15 is ESRD. ?  ?  ? ?GFR, Estimated  ?Date Value Ref Range Status  ?01/30/2022 >60 >60 mL/min Final  ?  Comment:  ?  (NOTE) ?Calculated using the CKD-EPI Creatinine Equation (2021) ?  ? ?eGFR  ?Date Value Ref Range Status  ?12/27/2020 111 >59 mL/min/1.73 Final  ?   ?  ?  Passed - Valid encounter within last 6 months  ?  Recent Outpatient Visits   ?      ? 4 months ago Type 2 diabetes mellitus without complication, without long-term  current use of insulin ([place])  ? [place], [place], [place]  ? 5 months ago Other acute nonsuppurative otitis media of right ear, recurrence not specified  ? [place] Ladell Pier, MD  ? 7 months ago Type 2 diabetes mellitus without complication, without long-term current use of insulin ([place])  ? [place] [name], MD  ? 1 year ago Annual physical exam  ? [place] [name], MD  ? 1 year ago Encounter for screening mammogram for malignant neoplasm of breast  ? Primary Care at [facility], [place] J, NP  ?  ?  ? ?  ?  ?  Passed - CBC within normal limits and completed in the last 12 months  ?  WBC  ?Date Value Ref Range Status  ?07/03/2021 4.0 4.0 - 10.5 K/uL Final  ? ?WBC Count  ?Date Value Ref Range Status  ?01/30/2022 4.5 4.0 - 10.5 K/uL Final  ? ?RBC  ?Date Value Ref Range Status  ?01/30/2022 5.25 (H) 3.87 - 5.11 MIL/uL Final  ? ?Hemoglobin  ?Date Value Ref Range Status  ?01/30/2022 13.5 12.0 - 15.0 g/dL Final  ? ?HCT  ?Date Value Ref Range Status  ?01/30/2022 42.4 36.0 - 46.0 % Final  ? ?MCHC  ?Date Value Ref Range Status  ?01/30/2022 31.8 30.0 - 36.0 g/dL Final  ? ?MCH  ?Date Value Ref Range Status  ?01/30/2022 25.7 (L) 26.0 - 34.0 pg Final  ? ?MCV  ?Date Value Ref Range Status  ?01/30/2022 80.8 80.0 - 100.0 fL Final  ? ?No results found for: PLTCOUNTKUC, LABPLAT, [place] ?RDW  ?Date Value Ref Range Status  ?01/30/2022 14.5 11.5 - 15.5 % Final  ? ?  ?  ?  ? ? ?

## 2022-02-14 ENCOUNTER — Other Ambulatory Visit: Payer: Self-pay | Admitting: Family Medicine

## 2022-02-14 DIAGNOSIS — E1169 Type 2 diabetes mellitus with other specified complication: Secondary | ICD-10-CM

## 2022-02-24 ENCOUNTER — Other Ambulatory Visit: Payer: Self-pay | Admitting: Family Medicine

## 2022-02-26 ENCOUNTER — Other Ambulatory Visit: Payer: Self-pay | Admitting: Family Medicine

## 2022-03-11 NOTE — Progress Notes (Deleted)
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  The report date is January 31, 2021.  The CancerNext-Expanded gene panel offered by [facility] and includes sequencing, rearrangement, and RNA analysis for the following 77 genes: AIP, ALK, APC, ATM, AXIN2, BAP1, BARD1, BLM, BMPR1A, BRCA1, BRCA2, BRIP1, CDC73, CDH1, CDK4, CDKN1B, CDKN2A, CHEK2, CTNNA1, DICER1, FANCC, FH, FLCN, GALNT12, KIF1B, LZTR1, MAX, MEN1, MET, MLH1, MSH2, MSH3, MSH6, MUTYH, NBN, NF1, NF2, NTHL1, PALB2, PHOX2B, PMS2, POT1, PRKAR1A, PTCH1, PTEN, RAD51C, RAD51D, RB1, RECQL, RET, SDHA, SDHAF2, SDHB, SDHC, SDHD, SMAD4, SMARCA4, SMARCB1, SMARCE1, STK11, SUFU, TMEM127, TP53, TSC1, TSC2, VHL and XRCC2 (sequencing and deletion/duplication); EGFR, EGLN1, HOXB13, KIT, MITF, PDGFRA, POLD1, and POLE (sequencing only); EPCAM and GREM1 (deletion/duplication only).    02/07/2021 - 05/23/2021 Neo-Adjuvant Chemotherapy    neoadjuvant chemotherapy consisting of pembrolizumab given day 1 together with carboplatin and paclitaxel, with Taxol  given on day 8 and 15, and filgrastim on days 16,17 and 18, started 02/07/2021, repeated every 21 days x 2, followed by doxorubicin and cyclophosphamide every 21 days x 4 also given with pembrolizumab on day 1 and PEG filgrastim day 3, started 03/22/2021 and completed 05/23/2021             (a) carboplatin/paclitaxel discontinued after the cycle 2-day 8 dose with neuropathy developing (subsequently resolved)   06/13/2021 -  Chemotherapy   Patient is on Treatment Plan : HEAD/NECK Pembrolizumab Q21D      07/11/2021 Surgery   right lumpectomy and sentinel lymph node sampling 07/11/2021 showed a complete pathologic response (ypT0 ypN0)             (a) a total of 3 right axillary lymph nodes were removed   08/16/2021 - 09/29/2021 Radiation Therapy   08/16/2021 through 09/29/2021 Site Technique Total Dose (Gy) Dose per Fx (Gy) Completed Fx Beam Energies  Breast, Right: Breast_Rt_IMN 3D 50/50 2 25/25 10X  Breast, Right: Breast_Rt_PAB_SCV 3D 50/50 2 25/25 10X, 15X  Breast, Right: Breast_Rt_Bst 3D 10/10 2 5/5 6X, 10X      INTERVAL HISTORY:  Ms. Baruch to review her survivorship care plan detailing her treatment course for breast cancer, as well as monitoring long-term side effects of that treatment, education regarding health maintenance, screening, and overall wellness and health promotion.     Overall, Ms. Woolverton reports feeling quite well   REVIEW OF SYSTEMS:  Review of Systems  Constitutional:  Negative for appetite change, chills, fatigue, fever and unexpected weight change.  HENT:   Negative for hearing loss, lump/mass and trouble swallowing.  Eyes:  Negative for eye problems and icterus.  Respiratory:  Negative for chest tightness, cough and shortness of breath.   Cardiovascular:  Negative for chest pain, leg swelling and palpitations.  Gastrointestinal:  Negative for abdominal distention, abdominal pain, constipation, diarrhea, nausea and vomiting.  Endocrine: Negative for  hot flashes.  Genitourinary:  Negative for difficulty urinating.   Musculoskeletal:  Negative for arthralgias.  Skin:  Negative for itching and rash.  Neurological:  Negative for dizziness, extremity weakness, headaches and numbness.  Hematological:  Negative for adenopathy. Does not bruise/bleed easily.  Psychiatric/Behavioral:  Negative for depression. The patient is not nervous/anxious.   Breast: Denies any new nodularity, masses, tenderness, nipple changes, or nipple discharge.      ONCOLOGY TREATMENT TEAM:  1. Surgeon:  Dr. Brantley Stage at [facility] 2. Medical Oncologist: Dr. Chryl Heck  3. Radiation Oncologist: Dr. Isidore Moos    PAST MEDICAL/SURGICAL HISTORY:  Past Medical History:  Diagnosis Date   Arthritis    Breast cancer ([place])    Diabetes mellitus    Family history of lymphoma 01/18/2021   Family history of throat cancer 01/18/2021   Hyperlipidemia    Hypertension    Past Surgical History:  Procedure Laterality Date   BREAST LUMPECTOMY WITH RADIOACTIVE SEED AND SENTINEL LYMPH NODE BIOPSY Right 07/11/2021   Procedure: RIGHT BREAST LUMPECTOMY WITH RADIOACTIVE SEED AND RIGHT SENTINEL LYMPH NODE BIOPSY;  Surgeon: Erroll Luna, MD;  Location: [place];  Service: General;  Laterality: Right;   CESAREAN SECTION     PORTACATH PLACEMENT N/A 01/26/2021   Procedure: INSERTION PORT-A-CATH;  Surgeon: Erroll Luna, MD;  Location: [place];  Service: General;  Laterality: N/A;  Deaf [place] Right 07/11/2021   Procedure: RADIOACTIVE SEED GUIDED RIGHT AXILLARY LYMPH NODE BIOPSY;  Surgeon: Erroll Luna, MD;  Location: [place];  Service: General;  Laterality: Right;     ALLERGIES:  Allergies  Allergen Reactions   Lisinopril Swelling    Swelling in throat, coughing, difficulty swallowing, could not sleep. Occurred Dec 2020.   Heparin Sod Firefighter) Lock Flush Other (See Comments)    Pork derived product      CURRENT MEDICATIONS:  Outpatient Encounter Medications as of 03/12/2022  Medication Sig   amLODipine (NORVASC) 10 MG tablet TAKE 1 TABLET BY MOUTH EVERY DAY   atorvastatin (LIPITOR) 80 MG tablet TAKE 1 TABLET BY MOUTH EVERY DAY   acetaminophen (TYLENOL) 500 MG tablet Take 1 tablet (500 mg total) by mouth 3 (three) times daily as needed (with one aleve tablet).   Ascorbic Acid (VITAMIN C) 1000 MG tablet Take 1 tablet (1,000 mg total) by mouth daily.   azelastine (ASTELIN) 0.1 % nasal spray Place 2 sprays into both nostrils 2 (two) times daily. Use in each nostril as directed   Blood Glucose Monitoring Suppl (ONETOUCH VERIO REFLECT) w/Device KIT 1 kit by Does not apply route in the morning, at noon, and at bedtime. Use as directed to test blood sugar up to three times daily.E11.9   cetirizine (ZYRTEC) 10 MG tablet Take 1 tablet (10 mg total) by mouth daily.   ergocalciferol (DRISDOL) 1.25 MG (50000 UT) capsule Take 1 capsule (50,000 Units total) by mouth once a week.   FARXIGA 10 MG TABS tablet TAKE 1 TABLET(10 MG) BY MOUTH DAILY WITH BREAKFAST   fluconazole (DIFLUCAN) 100 MG tablet Take 1 tablet (100 mg total) by mouth daily.   fluticasone (FLONASE) 50 MCG/ACT nasal spray  Place 2 sprays into both nostrils daily.   gabapentin (NEURONTIN) 100 MG capsule Take 1 capsule (100 mg total) by mouth 2 (two) times daily. Take early in the morning and at midday.   gabapentin (NEURONTIN) 300 MG capsule Take 1 capsule (300 mg total) by mouth at bedtime.   glipiZIDE (GLUCOTROL) 5 MG tablet TAKE 1 TABLET BY MOUTH DAILY   glucose blood (ONETOUCH VERIO) test strip Use as directed to test blood sugar up to three times daily. E11.9   labetalol (NORMODYNE) 100 MG tablet Take 100 mg by mouth 2 (two) times daily.   Lancets (ONETOUCH DELICA PLUS [id number]) MISC 1 Device by Does not apply route in the morning, at noon, and at bedtime. Use as directed to test blood sugar up to three times daily. E11.9    lidocaine-prilocaine (EMLA) cream APPLY TO AFFECTED AREA AS DIRECTED   metFORMIN (GLUCOPHAGE) 500 MG tablet TAKE 2 TABLETS(1000 MG) BY MOUTH TWICE DAILY   methocarbamol (ROBAXIN) 500 MG tablet Take 1 tablet (500 mg total) by mouth every 8 (eight) hours as needed for muscle spasms.   naproxen sodium (ALEVE) 220 MG tablet Take 1 tablet (220 mg total) by mouth 3 (three) times daily as needed (with one extra strength tylenol 5103m).   Omega-3 1000 MG CAPS Take 1 capsule (1,000 mg total) by mouth daily.   omeprazole (PRILOSEC) 20 MG capsule Take 1 capsule (20 mg total) by mouth daily.   vitamin B-12 (CYANOCOBALAMIN) 100 MCG tablet Take 1 tablet (100 mcg total) by mouth daily.   No facility-administered encounter medications on file as of 03/12/2022.     ONCOLOGIC FAMILY HISTORY:  Family History  Problem Relation Age of Onset   Diabetes Mother    Heart disease Mother    Diabetes Father    Throat cancer Father        d. 672s  Diabetes Sister    Diabetes Brother    Lymphoma Cousin        maternal cousin; d. 347s    SOCIAL HISTORY:  Social History   Socioeconomic History   Marital status: Divorced    Spouse name: Not on file   Number of children: Not on file   Years of education: Not on file   Highest education level: Not on file  Occupational History   Not on file  Tobacco Use   Smoking status: Never    Passive exposure: Yes   Smokeless tobacco: Never  Vaping Use   Vaping Use: Never used  Substance and Sexual Activity   Alcohol use: No   Drug use: No   Sexual activity: Not Currently  Other Topics Concern   Not on file  Social History Narrative   Not on file   Social Determinants of Health   Financial Resource Strain: Not on file  Food Insecurity: Not on file  Transportation Needs: Not on file  Physical Activity: Not on file  Stress: Not on file  Social Connections: Not on file  Intimate Partner Violence: Not on file     OBSERVATIONS/OBJECTIVE:  There were no  vitals taken for this visit. GENERAL: Patient is a well appearing female in no acute distress HEENT:  Sclerae anicteric.  Oropharynx clear and moist. No ulcerations or evidence of oropharyngeal candidiasis. Neck is supple.  NODES:  No cervical, supraclavicular, or axillary lymphadenopathy palpated.  BREAST EXAM:  right breast s/p lumpectomy and radiation, no sign of local recurrence. LUNGS:  Clear to auscultation bilaterally.  No wheezes  or rhonchi. HEART:  Regular rate and rhythm. No murmur appreciated. ABDOMEN:  Soft, nontender.  Positive, normoactive bowel sounds. No organomegaly palpated. MSK:  No focal spinal tenderness to palpation. Full range of motion bilaterally in the upper extremities. EXTREMITIES:  No peripheral edema.   SKIN:  Clear with no obvious rashes or skin changes. No nail dyscrasia. NEURO:  Nonfocal. Well oriented.  Appropriate affect.   LABORATORY DATA:  None for this visit.  DIAGNOSTIC IMAGING:  None for this visit.      ASSESSMENT AND PLAN:  Ms.. Rebecca [name] is a pleasant 56 y.o. 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  She presents to the Survivorship Clinic for our initial meeting and routine follow-up post-completion of treatment for breast cancer.    1. Stage IIIB right breast cancer:  Ms. Bridgers is continuing to recover from definitive treatment for breast cancer. She will follow-up with her medical oncologist, Dr. Chryl Heck in 6 months with history and physical exam per surveillance protocol. Her mammogram is due; orders placed today. Today, a comprehensive survivorship care plan and treatment summary was reviewed with the patient today detailing her breast cancer diagnosis, treatment course, potential late/long-term effects of treatment, appropriate follow-up care with recommendations for  the future, and patient education resources.  A copy of this summary, along with a letter will be sent to the patient's primary care provider via mail/fax/In Basket message after today's visit.    #. Problem(s) at Visit______________  #. Bone health:    She was given education on specific activities to promote bone health.  #. Cancer screening:  Due to Ms. Sealy's history and her age, she should receive screening for skin cancers, colon cancer, and gynecologic cancers.  The information and recommendations are listed on the patient's comprehensive care plan/treatment summary and were reviewed in detail with the patient.    #. Health maintenance and wellness promotion: Ms. Polka was encouraged to consume 5-7 servings of fruits and vegetables per day. We reviewed the "Nutrition Rainbow" handout.  She was also encouraged to engage in moderate to vigorous exercise for 30 minutes per day most days of the week. We discussed the [employer] fitness program, which is designed for cancer survivors to help them become more physically fit after cancer treatments.  She was instructed to limit her alcohol consumption and continue to abstain from tobacco use.     #. Support services/counseling: It is not uncommon for this period of the patient's cancer care trajectory to be one of many emotions and stressors.  She was given information regarding our available services and encouraged to contact me with any questions or for help enrolling in any of our support group/programs.    Follow up instructions:    -Return to cancer center in 6 months for f/u with Dr. Chryl Heck  -Mammogram due -Follow up with surgery in one year -She is welcome to return back to the Survivorship Clinic at any time; no additional follow-up needed at this time.  -Consider referral back to survivorship as a long-term survivor for continued surveillance  The patient was provided an opportunity to ask questions and all were  answered. The patient agreed with the plan and demonstrated an understanding of the instructions.   Total encounter time:*** minutes*in face-to-face visit time, chart review, lab review, care coordination, order entry, and documentation of the encounter time.    Wilber Bihari, NP 03/11/22 11:09  PM Medical Oncology and Hematology [facility] [place], [place] [address] Tel. [phone]    Fax. [phone]  *Total Encounter Time as defined by the Centers for Medicare and Medicaid Services includes, in addition to the face-to-face time of a patient visit (documented in the note above) non-face-to-face time: obtaining and reviewing outside history, ordering and reviewing medications, tests or procedures, care coordination (communications with other health care professionals or caregivers) and documentation in the medical record.

## 2022-03-12 ENCOUNTER — Telehealth: Payer: Self-pay | Admitting: *Deleted

## 2022-03-12 ENCOUNTER — Inpatient Hospital Stay: Payer: 59 | Attending: Adult Health | Admitting: Adult Health

## 2022-03-13 ENCOUNTER — Other Ambulatory Visit: Payer: Self-pay | Admitting: Family Medicine

## 2022-03-13 DIAGNOSIS — E119 Type 2 diabetes mellitus without complications: Secondary | ICD-10-CM

## 2022-03-14 NOTE — Telephone Encounter (Signed)
Requested early, sent via Interface. Requested Prescriptions  Pending Prescriptions Disp Refills  . metFORMIN (GLUCOPHAGE) 500 MG tablet [Pharmacy Med Name: METFORMIN 500MG TABLETS] 360 tablet     Sig: TAKE 2 TABLETS(1000 MG) BY MOUTH TWICE DAILY     Endocrinology:  Diabetes - Biguanides Failed - 03/13/2022  3:36 AM      Failed - HBA1C is between 0 and 7.9 and within 180 days    HbA1c, POC (controlled diabetic range)  Date Value Ref Range Status  06/29/2021 7.5 (A) 0.0 - 7.0 % Final         Failed - B12 Level in normal range and within 720 days    No results found for: VITAMINB12       Passed - Cr in normal range and within 360 days    Creatinine  Date Value Ref Range Status  01/30/2022 0.67 0.44 - 1.00 mg/dL Final   Creat  Date Value Ref Range Status  11/02/2016 0.50 0.50 - 1.10 mg/dL Final   Creatinine, Urine  Date Value Ref Range Status  11/02/2016 141 20 - 320 mg/dL Final         Passed - eGFR in normal range and within 360 days    GFR, Est African American  Date Value Ref Range Status  01/16/2016 >89 >=60 mL/min Final   GFR calc Af Amer  Date Value Ref Range Status  06/02/2020 118 >59 mL/min/1.73 Final    Comment:    **[facility] currently reports eGFR in compliance with the current**   recommendations of the [employer]. [facility] will   update reporting as new guidelines are published from the NKF-ASN   Task force.    GFR, Est Non African American  Date Value Ref Range Status  01/16/2016 >89 >=60 mL/min Final    Comment:      The estimated GFR is a calculation valid for adults (>=11 years old) that uses the CKD-EPI algorithm to adjust for age and sex. It is   not to be used for children, pregnant women, hospitalized patients,    patients on dialysis, or with rapidly changing kidney function. According to the NKDEP, eGFR >89 is normal, 60-89 shows mild impairment, 30-59 shows moderate impairment, 15-29 shows severe impairment and <15 is ESRD.       GFR, Estimated  Date Value Ref Range Status  01/30/2022 >60 >60 mL/min Final    Comment:    (NOTE) Calculated using the CKD-EPI Creatinine Equation (2021)    eGFR  Date Value Ref Range Status  12/27/2020 111 >59 mL/min/1.73 Final         Passed - Valid encounter within last 6 months    Recent Outpatient Visits          5 months ago Type 2 diabetes mellitus without complication, without long-term current use of insulin [facility])   [place], [place], [place]   6 months ago Other acute nonsuppurative otitis media of right ear, recurrence not specified   [place], Neoma Laming B, MD   8 months ago Type 2 diabetes mellitus without complication, without long-term current use of insulin ([place])   [facility] [name], MD   1 year ago Annual physical exam   [place], [place], MD   1 year ago Encounter for screening mammogram for malignant neoplasm of breast   [facility] at [facility], Amy J,  NP             Passed - CBC within normal limits and completed in the last 12 months    WBC  Date Value Ref Range Status  07/03/2021 4.0 4.0 - 10.5 K/uL Final   WBC Count  Date Value Ref Range Status  01/30/2022 4.5 4.0 - 10.5 K/uL Final   RBC  Date Value Ref Range Status  01/30/2022 5.25 (H) 3.87 - 5.11 MIL/uL Final   Hemoglobin  Date Value Ref Range Status  01/30/2022 13.5 12.0 - 15.0 g/dL Final   HCT  Date Value Ref Range Status  01/30/2022 42.4 36.0 - 46.0 % Final   MCHC  Date Value Ref Range Status  01/30/2022 31.8 30.0 - 36.0 g/dL Final   [facility]  Date Value Ref Range Status  01/30/2022 25.7 (L) 26.0 - 34.0 pg Final   MCV  Date Value Ref Range Status  01/30/2022 80.8 80.0 - 100.0 fL Final   No results found for: PLTCOUNTKUC, LABPLAT, POCPLA RDW  Date Value Ref Range Status  01/30/2022 14.5 11.5 - 15.5 %  Final         . glipiZIDE (GLUCOTROL) 5 MG tablet [Pharmacy Med Name: GLIPIZIDE 5MG TABLETS] 90 tablet     Sig: TAKE 1 TABLET BY MOUTH DAILY     Endocrinology:  Diabetes - Sulfonylureas Failed - 03/13/2022  3:36 AM      Failed - HBA1C is between 0 and 7.9 and within 180 days    HbA1c, POC (controlled diabetic range)  Date Value Ref Range Status  06/29/2021 7.5 (A) 0.0 - 7.0 % Final         Passed - Cr in normal range and within 360 days    Creatinine  Date Value Ref Range Status  01/30/2022 0.67 0.44 - 1.00 mg/dL Final   Creat  Date Value Ref Range Status  11/02/2016 0.50 0.50 - 1.10 mg/dL Final   Creatinine, Urine  Date Value Ref Range Status  11/02/2016 141 20 - 320 mg/dL Final         Passed - Valid encounter within last 6 months    Recent Outpatient Visits          5 months ago Type 2 diabetes mellitus without complication, without long-term current use of insulin [facility])   [place], [place], [place]   6 months ago Other acute nonsuppurative otitis media of right ear, recurrence not specified   [place], Neoma Laming B, MD   8 months ago Type 2 diabetes mellitus without complication, without long-term current use of insulin ([place])   [facility] [name], MD   1 year ago Annual physical exam   [place], Enobong, MD   1 year ago Encounter for screening mammogram for malignant neoplasm of breast   Primary Care at [facility], Flonnie Hailstone, NP

## 2022-03-15 ENCOUNTER — Other Ambulatory Visit: Payer: Self-pay | Admitting: Family Medicine

## 2022-03-15 DIAGNOSIS — E1169 Type 2 diabetes mellitus with other specified complication: Secondary | ICD-10-CM

## 2022-03-16 ENCOUNTER — Telehealth: Payer: Self-pay | Admitting: Adult Health

## 2022-03-16 ENCOUNTER — Other Ambulatory Visit: Payer: Self-pay | Admitting: *Deleted

## 2022-03-16 DIAGNOSIS — T451X5A Adverse effect of antineoplastic and immunosuppressive drugs, initial encounter: Secondary | ICD-10-CM

## 2022-03-16 MED ORDER — GABAPENTIN 100 MG PO CAPS: 100.0000 mg | ORAL_CAPSULE | Freq: Two times a day (BID) | ORAL | 6 refills | Status: DC

## 2022-03-16 NOTE — Telephone Encounter (Signed)
.  Called patient to schedule appointment per 5/26 inbasket, patient is aware of date and time.   

## 2022-03-26 ENCOUNTER — Inpatient Hospital Stay: Payer: 59 | Attending: Adult Health | Admitting: Physician Assistant

## 2022-03-26 VITALS — BP 131/81 | HR 106 | Temp 98.3°F | Resp 16 | Wt 149.0 lb

## 2022-03-26 DIAGNOSIS — Z9221 Personal history of antineoplastic chemotherapy: Secondary | ICD-10-CM | POA: Insufficient documentation

## 2022-03-26 DIAGNOSIS — Z79899 Other long term (current) drug therapy: Secondary | ICD-10-CM | POA: Diagnosis not present

## 2022-03-26 DIAGNOSIS — M25511 Pain in right shoulder: Secondary | ICD-10-CM | POA: Diagnosis not present

## 2022-03-26 DIAGNOSIS — C50311 Malignant neoplasm of lower-inner quadrant of right female breast: Secondary | ICD-10-CM | POA: Insufficient documentation

## 2022-03-26 DIAGNOSIS — B370 Candidal stomatitis: Secondary | ICD-10-CM | POA: Diagnosis not present

## 2022-03-26 DIAGNOSIS — M79606 Pain in leg, unspecified: Secondary | ICD-10-CM | POA: Insufficient documentation

## 2022-03-26 DIAGNOSIS — Z95828 Presence of other vascular implants and grafts: Secondary | ICD-10-CM | POA: Diagnosis not present

## 2022-03-26 DIAGNOSIS — M25519 Pain in unspecified shoulder: Secondary | ICD-10-CM

## 2022-03-26 DIAGNOSIS — Z923 Personal history of irradiation: Secondary | ICD-10-CM | POA: Diagnosis not present

## 2022-03-26 DIAGNOSIS — Z170 Estrogen receptor positive status [ER+]: Secondary | ICD-10-CM | POA: Diagnosis not present

## 2022-03-26 NOTE — Progress Notes (Signed)
Symptom Management Consult note [place]    Patient Care Team: [name], MD as PCP - General (Family Medicine) Erroll Luna, MD as Consulting Physician (General Surgery) Eppie Gibson, MD as Attending Physician (Radiation Oncology) Benay Pike, MD as Consulting Physician (Hematology and Oncology)    Name of the patient: Rebecca [name]  [id number]  27-Jan-1966   Date of visit: 03/26/2022    Chief complaint/ Reason for visit- port removal  Oncology History  Malignant neoplasm of lower-inner quadrant of right breast of female, estrogen receptor positive ([place])  01/06/2021 Initial Diagnosis   [place] woman status post right breast lower inner quadrant biopsy 01/06/2021 for a clinically T2 N1, stage IIIB 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  The report date is January 31, 2021.  The CancerNext-Expanded gene panel offered by [facility] and includes sequencing, rearrangement, and RNA analysis for the following 77 genes: AIP, ALK, APC, ATM, AXIN2, BAP1, BARD1, BLM, BMPR1A, BRCA1, BRCA2, BRIP1, CDC73, CDH1, CDK4, CDKN1B, CDKN2A, CHEK2, CTNNA1, DICER1, FANCC, FH, FLCN, GALNT12, KIF1B, LZTR1, MAX, MEN1, MET, MLH1, MSH2, MSH3, MSH6, MUTYH, NBN, NF1, NF2, NTHL1, PALB2, PHOX2B, PMS2, POT1, PRKAR1A, PTCH1, PTEN, RAD51C, RAD51D, RB1, RECQL, RET, SDHA,  SDHAF2, SDHB, SDHC, SDHD, SMAD4, SMARCA4, SMARCB1, SMARCE1, STK11, SUFU, TMEM127, TP53, TSC1, TSC2, VHL and XRCC2 (sequencing and deletion/duplication); EGFR, EGLN1, HOXB13, KIT, MITF, PDGFRA, POLD1, and POLE (sequencing only); EPCAM and GREM1 (deletion/duplication only).    02/07/2021 - 05/23/2021 Neo-Adjuvant Chemotherapy    neoadjuvant chemotherapy consisting of pembrolizumab given day 1 together with carboplatin and paclitaxel, with Taxol given on day 8 and 15, and filgrastim on days 16,17 and 18, started 02/07/2021, repeated every 21 days x 2, followed by doxorubicin and cyclophosphamide every 21 days x 4 also given with pembrolizumab on day 1 and PEG filgrastim day 3, started 03/22/2021 and completed 05/23/2021             (a) carboplatin/paclitaxel discontinued after the cycle 2-day 8 dose with neuropathy developing (subsequently resolved)   06/13/2021 -  Chemotherapy   Patient is on Treatment Plan : HEAD/NECK Pembrolizumab Q21D      07/11/2021 Surgery   right lumpectomy and sentinel lymph node sampling 07/11/2021 showed a complete pathologic response (ypT0 ypN0)             (a) a total of 3 right axillary lymph nodes were removed   08/16/2021 - 09/29/2021 Radiation Therapy   08/16/2021 through 09/29/2021 Site Technique Total Dose (Gy) Dose per Fx (Gy) Completed Fx Beam Energies  Breast, Right: Breast_Rt_IMN 3D 50/50 2 25/25 10X  Breast, Right: Breast_Rt_PAB_SCV 3D 50/50 2 25/25 10X, 15X  Breast, Right: Breast_Rt_Bst 3D 10/10 2 5/5 6X, 10X      Current Therapy: Last Keytruda treatment 01/30/22  Interval history- Rebecca [name] is a  56 yo female with oncologic history as above presenting to [facility] today with chief complaint of needing port removal. She informed the receptionist she was having right shoulder pain however tells me the reason for her visit is to have the port taken out. Patient finished treatment 01/30/22 and is scheduled for survivorship appointment 04/23/22. She states the  port is uncomfortable when she lays on her right side which is why is is eager to have it removed. This has been ongoing since it was placed, not a new problem. She denies any infectious symptoms. She is eager to put her cancer treatment behind her now that she completed treatment. When asked about her shoulder pain she states it has been ongoing for months and denies any change in symptoms She states her right shoulder pain is worse when she is doing repetitive motions at work.She takes tylenol for pain as needed. Denies any fever, chills, swelling, numbness, tingling, weakness, rash.    Due to language barrier, a video interpreter was present during the history-taking and subsequent discussion (and for part of the physical exam) with this patient.    ROS  All other systems are reviewed and are negative for acute change except as noted in the HPI.    Allergies  Allergen Reactions   Lisinopril Swelling    Swelling in throat, coughing, difficulty swallowing, could not sleep. Occurred Dec 2020.   Heparin Sod Firefighter) Lock Flush Other (See Comments)    Pork derived product     Past Medical History:  Diagnosis Date   Arthritis    Breast cancer ([place])    Diabetes mellitus    Family history of lymphoma 01/18/2021   Family history of throat cancer 01/18/2021   Hyperlipidemia    Hypertension      Past Surgical History:  Procedure Laterality Date   BREAST LUMPECTOMY WITH RADIOACTIVE SEED AND SENTINEL LYMPH NODE BIOPSY Right 07/11/2021   Procedure: RIGHT BREAST LUMPECTOMY WITH RADIOACTIVE SEED AND RIGHT SENTINEL LYMPH NODE BIOPSY;  Surgeon: Erroll Luna, MD;  Location: [place];  Service: General;  Laterality: Right;   CESAREAN SECTION     PORTACATH PLACEMENT N/A 01/26/2021   Procedure: INSERTION PORT-A-CATH;  Surgeon: Erroll Luna, MD;  Location: [facility];  Service: General;  Laterality: N/A;  21   [place] Right 07/11/2021    Procedure: RADIOACTIVE SEED GUIDED RIGHT AXILLARY LYMPH NODE BIOPSY;  Surgeon: Erroll Luna, MD;  Location: [facility];  Service: General;  Laterality: Right;    Social History   Socioeconomic History   Marital status: Divorced    Spouse name: Not on file   Number of children: Not on file   Years of education: Not on file   Highest education level: Not on file  Occupational History   Not on file  Tobacco Use   Smoking status: Never    Passive exposure: Yes   Smokeless tobacco: Never  Vaping Use   Vaping Use: Never used  Substance and Sexual Activity   Alcohol use: No   Drug use: No   Sexual activity: Not Currently  Other Topics Concern   Not on file  Social History Narrative   Not on file   Social Determinants of Health   Financial Resource Strain: Not on file  Food Insecurity: Not on file  Transportation Needs: Not on file  Physical Activity: Not on file  Stress: Not on file  Social Connections: Not on file  Intimate Partner  Violence: Not on file    Family History  Problem Relation Age of Onset   Diabetes Mother    Heart disease Mother    Diabetes Father    Throat cancer Father        d. 106s   Diabetes Sister    Diabetes Brother    Lymphoma Cousin        maternal cousin; d. 76s     Current Outpatient Medications:    amLODipine (NORVASC) 10 MG tablet, TAKE 1 TABLET BY MOUTH EVERY DAY, Disp: 30 tablet, Rfl: 0   atorvastatin (LIPITOR) 80 MG tablet, TAKE 1 TABLET BY MOUTH EVERY DAY, Disp: 30 tablet, Rfl: 0   acetaminophen (TYLENOL) 500 MG tablet, Take 1 tablet (500 mg total) by mouth 3 (three) times daily as needed (with one aleve tablet)., Disp: 30 tablet, Rfl: 0   Ascorbic Acid (VITAMIN C) 1000 MG tablet, Take 1 tablet (1,000 mg total) by mouth daily., Disp: 30 tablet, Rfl: 0   azelastine (ASTELIN) 0.1 % nasal spray, Place 2 sprays into both nostrils 2 (two) times daily. Use in each nostril as directed, Disp: 30 mL, Rfl: 12   Blood Glucose  Monitoring Suppl (ONETOUCH VERIO REFLECT) w/Device KIT, 1 kit by Does not apply route in the morning, at noon, and at bedtime. Use as directed to test blood sugar up to three times daily.E11.9, Disp: 1 kit, Rfl: 0   cetirizine (ZYRTEC) 10 MG tablet, Take 1 tablet (10 mg total) by mouth daily., Disp: 30 tablet, Rfl: 0   ergocalciferol (DRISDOL) 1.25 MG (50000 UT) capsule, Take 1 capsule (50,000 Units total) by mouth once a week., Disp: 12 capsule, Rfl: 0   FARXIGA 10 MG TABS tablet, TAKE 1 TABLET(10 MG) BY MOUTH DAILY WITH BREAKFAST, Disp: 90 tablet, Rfl: 0   fluconazole (DIFLUCAN) 100 MG tablet, Take 1 tablet (100 mg total) by mouth daily., Disp: 10 tablet, Rfl: 0   fluticasone (FLONASE) 50 MCG/ACT nasal spray, Place 2 sprays into both nostrils daily., Disp: 15.8 mL, Rfl: 2   gabapentin (NEURONTIN) 100 MG capsule, Take 1 capsule (100 mg total) by mouth 2 (two) times daily. Take early in the morning and at midday., Disp: 60 capsule, Rfl: 6   gabapentin (NEURONTIN) 300 MG capsule, Take 1 capsule (300 mg total) by mouth at bedtime., Disp: 90 capsule, Rfl: 4   glipiZIDE (GLUCOTROL) 5 MG tablet, TAKE 1 TABLET BY MOUTH DAILY, Disp: 60 tablet, Rfl: 0   glucose blood (ONETOUCH VERIO) test strip, Use as directed to test blood sugar up to three times daily. E11.9, Disp: 100 each, Rfl: 6   labetalol (NORMODYNE) 100 MG tablet, Take 100 mg by mouth 2 (two) times daily., Disp: , Rfl:    Lancets (ONETOUCH DELICA PLUS [id number]) MISC, 1 Device by Does not apply route in the morning, at noon, and at bedtime. Use as directed to test blood sugar up to three times daily. E11.9, Disp: 100 each, Rfl: 6   lidocaine-prilocaine (EMLA) cream, APPLY TO AFFECTED AREA AS DIRECTED, Disp: 30 g, Rfl: 3   metFORMIN (GLUCOPHAGE) 500 MG tablet, TAKE 2 TABLETS(1000 MG) BY MOUTH TWICE DAILY, Disp: 120 tablet, Rfl: 1   methocarbamol (ROBAXIN) 500 MG tablet, Take 1 tablet (500 mg total) by mouth every 8 (eight) hours as needed for muscle  spasms., Disp: 90 tablet, Rfl: 0   naproxen sodium (ALEVE) 220 MG tablet, Take 1 tablet (220 mg total) by mouth 3 (three) times daily as needed (with one extra  strength tylenol 581m)., Disp: , Rfl:    Omega-3 1000 MG CAPS, Take 1 capsule (1,000 mg total) by mouth daily., Disp: 30 capsule, Rfl: 0   omeprazole (PRILOSEC) 20 MG capsule, Take 1 capsule (20 mg total) by mouth daily., Disp: 30 capsule, Rfl: 0   vitamin B-12 (CYANOCOBALAMIN) 100 MCG tablet, Take 1 tablet (100 mcg total) by mouth daily., Disp: 30 tablet, Rfl: 0  PHYSICAL EXAM: ECOG FS:1 - Symptomatic but completely ambulatory    Vitals:   03/26/22 1101  BP: 131/81  Pulse: (!) 106  Resp: 16  Temp: 98.3 F (36.8 C)  TempSrc: Oral  SpO2: 99%  Weight: 149 lb (67.6 kg)   Physical Exam Vitals and nursing note reviewed.  Constitutional:      Appearance: She is well-developed. She is not ill-appearing or toxic-appearing.  HENT:     Head: Normocephalic and atraumatic.     Nose: Nose normal.  Eyes:     General: No scleral icterus.       Right eye: No discharge.        Left eye: No discharge.     Conjunctiva/sclera: Conjunctivae normal.  Neck:     Vascular: No JVD.     Comments: Full ROM intact without spinous process TTP. No bony stepoffs or deformities, no paraspinous muscle TTP or muscle spasms. No rigidity or meningeal signs. No bruising, erythema, or swelling.   Cardiovascular:     Rate and Rhythm: Normal rate and regular rhythm.     Pulses: Normal pulses.          Radial pulses are 2+ on the right side and 2+ on the left side.     Heart sounds: Normal heart sounds.  Pulmonary:     Effort: Pulmonary effort is normal.     Breath sounds: Normal breath sounds.  Chest:     Comments: Port in right upper chest without signs of infection. Non tender to palpation.  Abdominal:     General: There is no distension.  Musculoskeletal:        General: Normal range of motion.     Cervical back: Normal range of motion.      Comments: Compartments in RUE are soft. RUE is neurovascularly intact. Full ROM of RUE. No overlying skin changes. No swelling.  Skin:    General: Skin is warm and dry.  Neurological:     Mental Status: She is oriented to person, place, and time.     GCS: GCS eye subscore is 4. GCS verbal subscore is 5. GCS motor subscore is 6.     Comments: Fluent speech, no facial droop.  Psychiatric:        Behavior: Behavior normal.       LABORATORY DATA: I have reviewed the data as listed    Latest Ref Rng & Units 01/30/2022    9:21 AM 01/08/2022    8:35 AM 12/11/2021    1:35 PM  CBC  WBC 4.0 - 10.5 K/uL 4.5   3.8   5.0    Hemoglobin 12.0 - 15.0 g/dL 13.5   12.6   13.0    Hematocrit 36.0 - 46.0 % 42.4   39.4   38.9    Platelets 150 - 400 K/uL 232   255   229          Latest Ref Rng & Units 01/30/2022    9:21 AM 01/08/2022    8:35 AM 12/11/2021    1:35 PM  CMP  Glucose  70 - 99 mg/dL 245   295   176    BUN 6 - 20 mg/dL _0 Creatinine 0.44 - 1.00 mg/dL 0.67   0.49   0.57    Sodium 135 - 145 mmol/L 138   136   137    Potassium 3.5 - 5.1 mmol/L 4.1   3.9   3.5    Chloride 98 - 111 mmol/L 99   100   100    CO2 22 - 32 mmol/L _1 Calcium 8.9 - 10.3 mg/dL 9.3   9.4   9.5    Total Protein 6.5 - 8.1 g/dL 7.1   6.9   7.0    Total Bilirubin 0.3 - 1.2 mg/dL 0.3   0.3   0.3    Alkaline Phos 38 - 126 U/L 79   83   78    AST 15 - 41 U/L _2 ALT 0 - 44 U/L _3 RADIOGRAPHIC STUDIES: I have personally reviewed the radiological images as listed and agreed with the findings in the report. No images are attached to the encounter. No results found.   ASSESSMENT & PLAN: Patient is a 56 y.o. female  with oncologic history of malignant neoplasm of lower-inner quadrant of right breast, ER + followed by Dr. Chryl Heck.  I have viewed most recent oncology note and lab work.   #)Port removal- Patient eager to have port removed. Exam is not concerning for  infection. Staff message sent to surgeon Dr. Brantley Stage to let him know she would like to proceed with removal.  #)Right shoulder pain- MSK exam is normal. No recent trauma or concerning symptoms to warrant imaging at this time. Patient is established with [facility] here in [place] therefore encouraged her to follow up with them for further evaluation of this pain. Pain is more suggestive of arthritis as she has pain with repetitive motions at work. Pain did not improve after completing immunotherapy making it less likely to related to cause.  Visit Diagnosis: 1. Arthralgia of shoulder, unspecified laterality   2. Port-A-Cath in place      No orders of the defined types were placed in this encounter.   All questions were answered. The patient knows to call the clinic with any problems, questions or concerns. No barriers to learning was detected.  I have spent a total of 10 minutes minutes of face-to-face and non-face-to-face time, preparing to see the patient, obtaining and/or reviewing separately obtained history, performing a medically appropriate examination, counseling and educating the patient, documenting clinical information in the electronic health record, and care coordination.     Thank you for allowing me to participate in the care of this patient.    Barrie Folk, PA-C Department of Hematology/Oncology [facility] at [facility] Phone: [phone]  [phone]    03/26/2022 1:25 PM

## 2022-03-26 NOTE — Patient Instructions (Addendum)
-  Call the ortho doctors to help with your shoulder and leg pain. [facility] [place] [address] [phone]  -I sent a message to the surgeon to let them know you would like like your port removed. If you do not hear from the office by the end of the week you can call them: [phone]

## 2022-03-27 ENCOUNTER — Other Ambulatory Visit: Payer: Self-pay | Admitting: Family Medicine

## 2022-03-27 NOTE — Telephone Encounter (Signed)
Requested medication (s) are due for refill today: Yes  Requested medication (s) are on the active medication list: Yes  Last refill:  02/26/22  Future visit scheduled: Yes  Notes to clinic:  Pt. Has appointment in September, does PCP want to refill?    Requested Prescriptions  Pending Prescriptions Disp Refills   amLODipine (NORVASC) 10 MG tablet [Pharmacy Med Name: AMLODIPINE BESYLATE '10MG'$  TABLETS] 30 tablet 0    Sig: TAKE 1 TABLET BY MOUTH EVERY DAY     Cardiovascular: Calcium Channel Blockers 2 Passed - 03/27/2022  3:33 AM      Passed - Last BP in normal range    BP Readings from Last 1 Encounters:  03/26/22 131/81         Passed - Last Heart Rate in normal range    Pulse Readings from Last 1 Encounters:  03/26/22 (!) 106         Passed - Valid encounter within last 6 months    Recent Outpatient Visits           5 months ago Type 2 diabetes mellitus without complication, without long-term current use of insulin ([place])   [place], [place], [place]   6 months ago Other acute nonsuppurative otitis media of right ear, recurrence not specified   [place], Neoma Laming B, MD   9 months ago Type 2 diabetes mellitus without complication, without long-term current use of insulin ([place])   [facility] [name], MD   1 year ago Annual physical exam   [place], [place], MD   1 year ago Encounter for screening mammogram for malignant neoplasm of breast   Primary Care at [facility], Flonnie Hailstone, NP       Future Appointments             In 3 months [name], MD [place]

## 2022-04-05 ENCOUNTER — Other Ambulatory Visit: Payer: Self-pay | Admitting: Family Medicine

## 2022-04-05 DIAGNOSIS — E119 Type 2 diabetes mellitus without complications: Secondary | ICD-10-CM

## 2022-04-05 NOTE — Telephone Encounter (Signed)
Requested medication (s) are due for refill today: yes  Requested medication (s) are on the active medication list: yes  Last refill:  12/21/21 #90 0 refills   Future visit scheduled: yes in 3 months   Notes to clinic:  protocol failed last labs 06/29/21 do you want to refill Rx?     Requested Prescriptions  Pending Prescriptions Disp Refills   FARXIGA 10 MG TABS tablet [Pharmacy Med Name: FARXIGA 10MG TABLETS] 90 tablet 0    Sig: TAKE 1 TABLET(10 MG) BY MOUTH DAILY WITH BREAKFAST     Endocrinology:  Diabetes - SGLT2 Inhibitors Failed - 04/05/2022  3:51 AM      Failed - HBA1C is between 0 and 7.9 and within 180 days    HbA1c, POC (controlled diabetic range)  Date Value Ref Range Status  06/29/2021 7.5 (A) 0.0 - 7.0 % Final         Passed - Cr in normal range and within 360 days    Creatinine  Date Value Ref Range Status  01/30/2022 0.67 0.44 - 1.00 mg/dL Final   Creat  Date Value Ref Range Status  11/02/2016 0.50 0.50 - 1.10 mg/dL Final   Creatinine, Urine  Date Value Ref Range Status  11/02/2016 141 20 - 320 mg/dL Final         Passed - eGFR in normal range and within 360 days    GFR, Est African American  Date Value Ref Range Status  01/16/2016 >89 >=60 mL/min Final   GFR calc Af Amer  Date Value Ref Range Status  06/02/2020 118 >59 mL/min/1.73 Final    Comment:    **[facility] currently reports eGFR in compliance with the current**   recommendations of the [employer]. [facility] will   update reporting as new guidelines are published from the NKF-ASN   Task force.    GFR, Est Non African American  Date Value Ref Range Status  01/16/2016 >89 >=60 mL/min Final    Comment:      The estimated GFR is a calculation valid for adults (>=25 years old) that uses the CKD-EPI algorithm to adjust for age and sex. It is   not to be used for children, pregnant women, hospitalized patients,    patients on dialysis, or with rapidly changing kidney  function. According to the NKDEP, eGFR >89 is normal, 60-89 shows mild impairment, 30-59 shows moderate impairment, 15-29 shows severe impairment and <15 is ESRD.      GFR, Estimated  Date Value Ref Range Status  01/30/2022 >60 >60 mL/min Final    Comment:    (NOTE) Calculated using the CKD-EPI Creatinine Equation (2021)    eGFR  Date Value Ref Range Status  12/27/2020 111 >59 mL/min/1.73 Final         Passed - Valid encounter within last 6 months    Recent Outpatient Visits           5 months ago Type 2 diabetes mellitus without complication, without long-term current use of insulin [facility])   [place], [place], [place]   6 months ago Other acute nonsuppurative otitis media of right ear, recurrence not specified   [place], Neoma Laming B, MD   9 months ago Type 2 diabetes mellitus without complication, without long-term current use of insulin ([place])   [place] [facility] [name], MD   1 year ago Annual physical exam   [place]  [facility] [place], [name], MD   1 year ago Encounter for screening mammogram for malignant neoplasm of breast   Primary Care at [facility], Flonnie Hailstone, NP       Future Appointments             In 3 months [name], MD [place]

## 2022-04-15 ENCOUNTER — Emergency Department (HOSPITAL_COMMUNITY)
Admission: EM | Admit: 2022-04-15 | Discharge: 2022-04-15 | Disposition: A | Discharge status: Home / Self Care | Payer: 59 | Attending: Emergency Medicine | Admitting: Emergency Medicine

## 2022-04-15 ENCOUNTER — Other Ambulatory Visit: Payer: Self-pay

## 2022-04-15 ENCOUNTER — Emergency Department (HOSPITAL_COMMUNITY): Payer: 59

## 2022-04-15 ENCOUNTER — Emergency Department (HOSPITAL_BASED_OUTPATIENT_CLINIC_OR_DEPARTMENT_OTHER): Payer: 59

## 2022-04-15 ENCOUNTER — Encounter (HOSPITAL_COMMUNITY): Payer: Self-pay | Admitting: Pharmacy Technician

## 2022-04-15 ENCOUNTER — Other Ambulatory Visit: Payer: Self-pay | Admitting: Family Medicine

## 2022-04-15 DIAGNOSIS — R52 Pain, unspecified: Secondary | ICD-10-CM

## 2022-04-15 DIAGNOSIS — R519 Headache, unspecified: Secondary | ICD-10-CM | POA: Diagnosis present

## 2022-04-15 DIAGNOSIS — Z20822 Contact with and (suspected) exposure to covid-19: Secondary | ICD-10-CM | POA: Insufficient documentation

## 2022-04-15 DIAGNOSIS — R509 Fever, unspecified: Secondary | ICD-10-CM | POA: Diagnosis not present

## 2022-04-15 DIAGNOSIS — I10 Essential (primary) hypertension: Secondary | ICD-10-CM | POA: Diagnosis not present

## 2022-04-15 DIAGNOSIS — M791 Myalgia, unspecified site: Secondary | ICD-10-CM | POA: Diagnosis not present

## 2022-04-15 DIAGNOSIS — Z853 Personal history of malignant neoplasm of breast: Secondary | ICD-10-CM | POA: Insufficient documentation

## 2022-04-15 DIAGNOSIS — Z7984 Long term (current) use of oral hypoglycemic drugs: Secondary | ICD-10-CM | POA: Insufficient documentation

## 2022-04-15 DIAGNOSIS — M79605 Pain in left leg: Secondary | ICD-10-CM | POA: Diagnosis not present

## 2022-04-15 DIAGNOSIS — M79651 Pain in right thigh: Secondary | ICD-10-CM | POA: Diagnosis not present

## 2022-04-15 DIAGNOSIS — E119 Type 2 diabetes mellitus without complications: Secondary | ICD-10-CM

## 2022-04-15 DIAGNOSIS — R739 Hyperglycemia, unspecified: Secondary | ICD-10-CM | POA: Diagnosis not present

## 2022-04-15 DIAGNOSIS — Z79899 Other long term (current) drug therapy: Secondary | ICD-10-CM | POA: Insufficient documentation

## 2022-04-15 DIAGNOSIS — M79604 Pain in right leg: Secondary | ICD-10-CM

## 2022-04-15 DIAGNOSIS — M79652 Pain in left thigh: Secondary | ICD-10-CM | POA: Diagnosis not present

## 2022-04-15 LAB — URINALYSIS, ROUTINE W REFLEX MICROSCOPIC
Bacteria, UA: NONE SEEN
Bilirubin Urine: NEGATIVE
Glucose, UA: >=500 mg/dL — AB
Hgb urine dipstick: NEGATIVE
Ketones, ur: NEGATIVE mg/dL
Leukocytes,Ua: NEGATIVE
Nitrite: NEGATIVE
Protein, ur: NEGATIVE mg/dL
Specific Gravity, Urine: 1.026 (ref 1.005–1.030)
pH: 5 (ref 5.0–8.0)

## 2022-04-15 LAB — CBC
HCT: 44 % (ref 36.0–46.0)
Hemoglobin: 13.8 g/dL (ref 12.0–15.0)
MCH: 25.6 pg — ABNORMAL LOW (ref 26.0–34.0)
MCHC: 31.4 g/dL (ref 30.0–36.0)
MCV: 81.5 fL (ref 80.0–100.0)
Platelets: 247 10*3/uL (ref 150–400)
RBC: 5.4 MIL/uL — ABNORMAL HIGH (ref 3.87–5.11)
RDW: 13.9 % (ref 11.5–15.5)
WBC: 4.8 10*3/uL (ref 4.0–10.5)
nRBC: 0 % (ref 0.0–0.2)

## 2022-04-15 LAB — LACTIC ACID, PLASMA: Lactic Acid, Venous: 1.6 mmol/L (ref 0.5–1.9)

## 2022-04-15 LAB — SARS CORONAVIRUS 2 BY RT PCR: SARS Coronavirus 2 by RT PCR: NEGATIVE

## 2022-04-15 LAB — BASIC METABOLIC PANEL
Anion gap: 13 (ref 5–15)
BUN: 10 mg/dL (ref 6–20)
CO2: 24 mmol/L (ref 22–32)
Calcium: 9.3 mg/dL (ref 8.9–10.3)
Chloride: 100 mmol/L (ref 98–111)
Creatinine, Ser: 0.47 mg/dL (ref 0.44–1.00)
GFR, Estimated: >60 mL/min (ref >60)
Glucose, Bld: 178 mg/dL — ABNORMAL HIGH (ref 70–99)
Potassium: 4 mmol/L (ref 3.5–5.1)
Sodium: 137 mmol/L (ref 135–145)

## 2022-04-15 MED ORDER — OXYCODONE-ACETAMINOPHEN 5-325 MG PO TABS
1.0000 | ORAL_TABLET | Freq: Once | ORAL | Status: AC
  Administered 2022-04-15: 1 via ORAL
  Filled 2022-04-15: qty 1

## 2022-04-17 ENCOUNTER — Other Ambulatory Visit: Payer: Self-pay | Admitting: Family Medicine

## 2022-04-17 DIAGNOSIS — E119 Type 2 diabetes mellitus without complications: Secondary | ICD-10-CM

## 2022-04-20 ENCOUNTER — Encounter: Payer: Self-pay | Admitting: Hematology and Oncology

## 2022-04-20 LAB — CULTURE, BLOOD (SINGLE)
Culture: NO GROWTH
Special Requests: ADEQUATE

## 2022-04-23 ENCOUNTER — Ambulatory Visit (HOSPITAL_COMMUNITY)
Admission: RE | Admit: 2022-04-23 | Discharge: 2022-04-23 | Disposition: A | Discharge status: Home / Self Care | Payer: 59 | Source: Ambulatory Visit | Attending: Adult Health | Admitting: Adult Health

## 2022-04-23 ENCOUNTER — Encounter: Payer: Self-pay | Admitting: Adult Health

## 2022-04-23 ENCOUNTER — Other Ambulatory Visit: Payer: Self-pay

## 2022-04-23 ENCOUNTER — Inpatient Hospital Stay: Payer: 59 | Attending: Adult Health | Admitting: Adult Health

## 2022-04-23 VITALS — BP 124/76 | HR 93 | Temp 97.9°F | Resp 18 | Ht 63.0 in | Wt 145.8 lb

## 2022-04-23 DIAGNOSIS — C50311 Malignant neoplasm of lower-inner quadrant of right female breast: Secondary | ICD-10-CM | POA: Insufficient documentation

## 2022-04-23 DIAGNOSIS — Z171 Estrogen receptor negative status [ER-]: Secondary | ICD-10-CM | POA: Insufficient documentation

## 2022-04-23 DIAGNOSIS — M79606 Pain in leg, unspecified: Secondary | ICD-10-CM | POA: Insufficient documentation

## 2022-04-23 DIAGNOSIS — Z170 Estrogen receptor positive status [ER+]: Secondary | ICD-10-CM | POA: Insufficient documentation

## 2022-04-23 MED ORDER — NYSTATIN 100000 UNIT/ML MT SUSP: 5.0000 mL | Freq: Four times a day (QID) | OROMUCOSAL | 0 refills | Status: DC

## 2022-04-23 NOTE — Progress Notes (Signed)
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  The report date is January 31, 2021.  The CancerNext-Expanded gene panel offered by [facility] and includes sequencing, rearrangement, and RNA analysis for the following 77 genes: AIP, ALK, APC, ATM, AXIN2, BAP1, BARD1, BLM, BMPR1A, BRCA1, BRCA2, BRIP1, CDC73, CDH1, CDK4, CDKN1B, CDKN2A, CHEK2, CTNNA1, DICER1, FANCC, FH, FLCN, GALNT12, KIF1B, LZTR1, MAX, MEN1, MET, MLH1, MSH2, MSH3, MSH6, MUTYH, NBN, NF1, NF2, NTHL1, PALB2, PHOX2B, PMS2, POT1, PRKAR1A, PTCH1, PTEN, RAD51C, RAD51D, RB1, RECQL, RET, SDHA, SDHAF2, SDHB, SDHC, SDHD, SMAD4, SMARCA4, SMARCB1, SMARCE1, STK11, SUFU, TMEM127, TP53, TSC1, TSC2, VHL and XRCC2 (sequencing and deletion/duplication); EGFR, EGLN1, HOXB13, KIT, MITF, PDGFRA, POLD1, and POLE (sequencing only); EPCAM and GREM1 (deletion/duplication only).    02/07/2021 - 05/23/2021 Neo-Adjuvant Chemotherapy    neoadjuvant chemotherapy consisting of pembrolizumab given day 1 together with carboplatin and paclitaxel, with Taxol given  on day 8 and 15, and filgrastim on days 16,17 and 18, started 02/07/2021, repeated every 21 days x 2, followed by doxorubicin and cyclophosphamide every 21 days x 4 also given with pembrolizumab on day 1 and PEG filgrastim day 3, started 03/22/2021 and completed 05/23/2021             (a) carboplatin/paclitaxel discontinued after the cycle 2-day 8 dose with neuropathy developing (subsequently resolved)   06/13/2021 - 01/30/2022 Chemotherapy   Patient is on Treatment Plan : HEAD/NECK Pembrolizumab Q21D     07/11/2021 Surgery   right lumpectomy and sentinel lymph node sampling 07/11/2021 showed a complete pathologic response (ypT0 ypN0)             (a) a total of 3 right axillary lymph nodes were removed   08/16/2021 - 09/29/2021 Radiation Therapy   08/16/2021 through 09/29/2021 Site Technique Total Dose (Gy) Dose per Fx (Gy) Completed Fx Beam Energies  Breast, Right: Breast_Rt_IMN 3D 50/50 2 25/25 10X  Breast, Right: Breast_Rt_PAB_SCV 3D 50/50 2 25/25 10X, 15X  Breast, Right: Breast_Rt_Bst 3D 10/10 2 5/5 6X, 10X      INTERVAL HISTORY:  Ms. Ridinger to review her survivorship care plan detailing her treatment course for breast cancer, as well as monitoring long-term side effects of that treatment, education regarding health maintenance, screening, and overall wellness and health promotion.     Overall, Ms. Donelan reports feeling quite well.  She is accompanied by Arabic interpreter named [place].  Harlynn is struggling with thrush in her mouth.  She says this is intermittent and she is doing the mouth rinses as instructed by her PCP.  She underwent bilateral diagnostic mammogram on 03/15/2022 that showed no evidence of malignancy and breast density category B.  She is scheduled for port removal on 05/22/2022.  She has PCP f/u scheduled in 06/2022.  She misses her appts sometimes because she forgets about them.    Rolanda is experiencing leg pain.  She says this is pain starts in her posterior  thigh and radiates downward.  It gets worse as it gets closer to the knees.  This pain is also worse on her feet and in her leg.  Due to this pain she is unable to work.    She underwent ultrasound in the ER on 04/15/2022 that was negative for DVT.  CT head showed no intracranial abnormality.  She is requesting medication to help her pain.    REVIEW OF SYSTEMS:  Review of Systems  Constitutional:  Negative for appetite change, chills, fatigue, fever and unexpected weight change.  HENT:   Negative for hearing loss, lump/mass and trouble swallowing.   Eyes:  Negative for eye problems and icterus.  Respiratory:  Negative for chest tightness, cough and shortness of breath.   Cardiovascular:  Negative for chest pain, leg swelling and palpitations.  Gastrointestinal:  Negative for abdominal distention, abdominal pain, constipation, diarrhea, nausea and vomiting.  Endocrine: Negative for hot flashes.  Genitourinary:  Negative for difficulty urinating.   Musculoskeletal:  Negative for arthralgias.  Skin:  Negative for itching and rash.  Neurological:  Negative for dizziness, extremity weakness, headaches and numbness.  Hematological:  Negative for adenopathy. Does not bruise/bleed easily.  Psychiatric/Behavioral:  Negative for depression. The patient is not nervous/anxious.   Breast: Denies any new nodularity, masses, tenderness, nipple changes, or nipple discharge.      ONCOLOGY TREATMENT TEAM:  1. Surgeon:  Dr. Brantley Stage at [facility] 2. Medical Oncologist: Dr. Chryl Heck  3. Radiation Oncologist: Dr. Isidore Moos    PAST MEDICAL/SURGICAL HISTORY:  Past Medical History:  Diagnosis Date   Arthritis    Breast cancer ([place])    Diabetes mellitus    Family history of lymphoma 01/18/2021   Family history of throat cancer 01/18/2021   Hyperlipidemia    Hypertension    Past Surgical History:  Procedure Laterality Date   BREAST LUMPECTOMY WITH RADIOACTIVE SEED AND SENTINEL LYMPH NODE BIOPSY  Right 07/11/2021   Procedure: RIGHT BREAST LUMPECTOMY WITH RADIOACTIVE SEED AND RIGHT SENTINEL LYMPH NODE BIOPSY;  Surgeon: Erroll Luna, MD;  Location: [facility];  Service: General;  Laterality: Right;   CESAREAN SECTION     PORTACATH PLACEMENT N/A 01/26/2021   Procedure: INSERTION PORT-A-CATH;  Surgeon: Erroll Luna, MD;  Location: [facility];  Service: General;  Laterality: N/A;  [place] Right 07/11/2021   Procedure: RADIOACTIVE SEED GUIDED RIGHT AXILLARY LYMPH NODE BIOPSY;  Surgeon: Erroll Luna, MD;  Location: [facility];  Service: General;  Laterality: Right;     ALLERGIES:  Allergies  Allergen Reactions   Lisinopril Swelling    Swelling in throat, coughing, difficulty swallowing, could not sleep. Occurred Dec 2020.   Heparin Sod Firefighter) Lock Flush Other (See Comments)    Pork derived product     CURRENT MEDICATIONS:  Outpatient Encounter Medications as of 04/23/2022  Medication Sig   acetaminophen (TYLENOL) 500 MG tablet Take 1 tablet (500 mg total) by mouth 3 (three) times daily as needed (with one aleve tablet).   amLODipine (NORVASC) 10 MG tablet TAKE 1 TABLET BY MOUTH  EVERY DAY   Ascorbic Acid (VITAMIN C) 1000 MG tablet Take 1 tablet (1,000 mg total) by mouth daily.   atorvastatin (LIPITOR) 80 MG tablet TAKE 1 TABLET BY MOUTH EVERY DAY (Patient not taking: Reported on 04/23/2022)   Blood Glucose Monitoring Suppl (ONETOUCH VERIO REFLECT) w/Device KIT 1 kit by Does not apply route in the morning, at noon, and at bedtime. Use as directed to test blood sugar up to three times daily.E11.9   FARXIGA 10 MG TABS tablet TAKE 1 TABLET(10 MG) BY MOUTH DAILY WITH BREAKFAST   gabapentin (NEURONTIN) 100 MG capsule Take 1 capsule (100 mg total) by mouth 2 (two) times daily. Take early in the morning and at midday.   gabapentin (NEURONTIN) 300 MG capsule Take 1 capsule (300 mg total) by mouth at bedtime.   glipiZIDE  (GLUCOTROL) 5 MG tablet TAKE 1 TABLET BY MOUTH DAILY   glucose blood (ONETOUCH VERIO) test strip Use as directed to test blood sugar up to three times daily. E11.9   Lancets (ONETOUCH DELICA PLUS ULAGTX64W) MISC 1 Device by Does not apply route in the morning, at noon, and at bedtime. Use as directed to test blood sugar up to three times daily. E11.9   metFORMIN (GLUCOPHAGE) 500 MG tablet TAKE 2 TABLETS(1000 MG) BY MOUTH TWICE DAILY   methocarbamol (ROBAXIN) 500 MG tablet Take 1 tablet (500 mg total) by mouth every 8 (eight) hours as needed for muscle spasms.   Omega-3 1000 MG CAPS Take 1 capsule (1,000 mg total) by mouth daily.   vitamin B-12 (CYANOCOBALAMIN) 100 MCG tablet Take 1 tablet (100 mcg total) by mouth daily.   azelastine (ASTELIN) 0.1 % nasal spray Place 2 sprays into both nostrils 2 (two) times daily. Use in each nostril as directed (Patient not taking: Reported on 04/23/2022)   cetirizine (ZYRTEC) 10 MG tablet Take 1 tablet (10 mg total) by mouth daily. (Patient not taking: Reported on 04/23/2022)   fluconazole (DIFLUCAN) 100 MG tablet Take 1 tablet (100 mg total) by mouth daily. (Patient not taking: Reported on 04/23/2022)   fluticasone (FLONASE) 50 MCG/ACT nasal spray Place 2 sprays into both nostrils daily. (Patient not taking: Reported on 04/23/2022)   labetalol (NORMODYNE) 100 MG tablet Take 100 mg by mouth 2 (two) times daily. (Patient not taking: Reported on 04/23/2022)   naproxen sodium (ALEVE) 220 MG tablet Take 1 tablet (220 mg total) by mouth 3 (three) times daily as needed (with one extra strength tylenol 561m). (Patient not taking: Reported on 04/23/2022)   omeprazole (PRILOSEC) 20 MG capsule Take 1 capsule (20 mg total) by mouth daily. (Patient not taking: Reported on 04/23/2022)   [DISCONTINUED] ergocalciferol (DRISDOL) 1.25 MG (50000 UT) capsule Take 1 capsule (50,000 Units total) by mouth once a week. (Patient not taking: Reported on 04/23/2022)   [DISCONTINUED] glipiZIDE (GLUCOTROL) 5  MG tablet TAKE 1 TABLET BY MOUTH DAILY   [DISCONTINUED] metFORMIN (GLUCOPHAGE) 500 MG tablet TAKE 2 TABLETS(1000 MG) BY MOUTH TWICE DAILY   No facility-administered encounter medications on file as of 04/23/2022.     ONCOLOGIC FAMILY HISTORY:  Family History  Problem Relation Age of Onset   Diabetes Mother    Heart disease Mother    Diabetes Father    Throat cancer Father        d. 627s  Diabetes Sister    Diabetes Brother    Lymphoma Cousin        maternal cousin; d. 343s     SOCIAL HISTORY:  Social History   Socioeconomic History   Marital status: Divorced    Spouse name: Not on file   Number of children: Not on file   Years of education: Not on file   Highest education level: Not on file  Occupational History   Not on file  Tobacco Use   Smoking status: Never    Passive exposure: Yes   Smokeless tobacco: Never  Vaping Use   Vaping Use: Never used  Substance and Sexual Activity   Alcohol use: No   Drug use: No   Sexual activity: Not Currently  Other Topics Concern   Not on file  Social History Narrative   Not on file   Social Determinants of Health   Financial Resource Strain: Not on file  Food Insecurity: Not on file  Transportation Needs: No Transportation Needs (01/18/2021)   PRAPARE - Transportation    Lack of Transportation (Medical): No    Lack of Transportation (Non-Medical): No  Physical Activity: Not on file  Stress: Not on file  Social Connections: Not on file  Intimate Partner Violence: Not on file     OBSERVATIONS/OBJECTIVE:  BP 124/76 (BP Location: Left Arm, Patient Position: Sitting)   Pulse 93   Temp 97.9 F (36.6 C) (Temporal)   Resp 18   Ht 5' 3"  (1.6 m)   Wt 145 lb 12.8 oz (66.1 kg)   SpO2 99%   BMI 25.83 kg/m  GENERAL: Patient is a well appearing female in no acute distress HEENT:  Sclerae anicteric.  Oropharynx clear and moist. + candida on tongue and in buccal mucosa. Neck is supple.  NODES:  No cervical,  supraclavicular, or axillary lymphadenopathy palpated.  BREAST EXAM:  right breast s/p lumpectomy and radiation, no sign of local recurrence, left breast benign LUNGS:  Clear to auscultation bilaterally.  No wheezes or rhonchi. HEART:  Regular rate and rhythm. No murmur appreciated. ABDOMEN:  Soft, nontender.  Positive, normoactive bowel sounds. No organomegaly palpated. MSK:  No focal spinal tenderness to palpation. Full range of motion bilaterally in the upper extremities. EXTREMITIES:  No peripheral edema.   SKIN:  Clear with no obvious rashes or skin changes. No nail dyscrasia. NEURO:  Nonfocal. Well oriented.  Appropriate affect.   LABORATORY DATA:  None for this visit.  DIAGNOSTIC IMAGING:  None for this visit.      ASSESSMENT AND PLAN:  Ms.. Noa is a pleasant 56 y.o. female with Stage IIIB right breast invasive ductal carcinoma, ER-/PR-/HER2-, diagnosed in 12/2020, treated with neoadjuvant chemotherapy, maintenance pembrolizumab, lumpectomy, and adjuvant radiation therapy.  She presents to the Survivorship Clinic for our initial meeting and routine follow-up post-completion of treatment for breast cancer.    1. Stage IIIB right breast cancer:  Ms. Kneisley is continuing to recover from definitive treatment for breast cancer. She will follow-up with her medical oncologist, Dr. Chryl Heck in 07/2022 with history and physical exam per surveillance protocol.   Her mammogram is due; orders placed today. Today, a comprehensive survivorship care plan and treatment summary was reviewed with the patient today detailing her breast cancer diagnosis, treatment course, potential late/long-term effects of treatment, appropriate follow-up care with recommendations for the future, and patient education resources.  A copy of this summary, along with a letter will be sent to the patient's primary care provider via mail/fax/In Basket message after today's visit.    2. Leg Pain: Will get plain  films today and she was also recommended to f/u with her PCP.  She  has not seen them and had diabetes f/u in an extended period of time.  She is unclear about her medications and dosages, so I recommended she continue one tylenol and one aleve BIDPRN.  I reviewed with her that she needs to take responsibility for her appointments and work to set reminders or put up calendars to attend her appointments when scheduled.    3. Bone health:   She was given education on specific activities to promote bone health.  4. Cancer screening:  Due to Ms. Ciavarella's history and her age, she should receive screening for skin cancers, colon cancer, and gynecologic cancers.  The information and recommendations are listed on the patient's comprehensive care plan/treatment summary and were reviewed in detail with the patient.    5. Health maintenance and wellness promotion: Ms. Derderian was encouraged to consume 5-7 servings of fruits and vegetables per day. We reviewed the "Nutrition Rainbow" handout. She was also encouraged to engage in moderate to vigorous exercise for 30 minutes per day most days of the week. We discussed the [employer] fitness program, which is designed for cancer survivors to help them become more physically fit after cancer treatments.  She was instructed to limit her alcohol consumption and continue to abstain from tobacco use.     6. Support services/counseling: It is not uncommon for this period of the patient's cancer care trajectory to be one of many emotions and stressors.  She was given information regarding our available services and encouraged to contact me with any questions or for help enrolling in any of our support group/programs.   7. Thrush: Nystatin sent to [facility].   Follow up instructions:    -Return to cancer center in 07/2022 for f/u with Dr. Chryl Heck  -Mammogram due 02/2023 -Follow up with surgery 01/2023 -She is welcome to return back to the Survivorship Clinic  at any time; no additional follow-up needed at this time.  -Consider referral back to survivorship as a long-term survivor for continued surveillance  The patient was provided an opportunity to ask questions and all were answered. The patient agreed with the plan and demonstrated an understanding of the instructions.   Total encounter time:40 minutes* in face-to-face visit time, chart review, lab review, care coordination, order entry, and documentation of the encounter time.    Wilber Bihari, NP 04/23/22 10:24 AM Medical Oncology and Hematology [facility] [place], [place] [address] Tel. [phone]    Fax. [phone]  *Total Encounter Time as defined by the Centers for Medicare and Medicaid Services includes, in addition to the face-to-face time of a patient visit (documented in the note above) non-face-to-face time: obtaining and reviewing outside history, ordering and reviewing medications, tests or procedures, care coordination (communications with other health care professionals or caregivers) and documentation in the medical record.

## 2022-04-25 ENCOUNTER — Telehealth: Payer: Self-pay

## 2022-04-25 NOTE — Telephone Encounter (Signed)
Called her thru the pacific interpretors line.  Called and thru the interpretor speaking Arabic. Given message from Wilber Bihari, NP,  that her xray is ok, it doesn't show any concerning lesions and that she should f/u with her PCP. She verbalized understanding and appreciated the call.

## 2022-04-27 ENCOUNTER — Other Ambulatory Visit: Payer: Self-pay | Admitting: Family Medicine

## 2022-04-27 DIAGNOSIS — R936 Abnormal findings on diagnostic imaging of limbs: Secondary | ICD-10-CM

## 2022-04-27 NOTE — Progress Notes (Signed)
Can you please schedule MRI of the femur which I ordered based on abnormal findings on xray forwarded to me by Oncologist.

## 2022-04-30 NOTE — Addendum Note (Signed)
Addended by: [name] on: 04/30/2022 04:38 PM   Modules accepted: Orders

## 2022-04-30 NOTE — Progress Notes (Signed)
Done

## 2022-04-30 NOTE — Progress Notes (Signed)
Pt has [employer] insurance, can order be changed to [facility].

## 2022-05-02 NOTE — Progress Notes (Signed)
Pt was called and VM was left informing patient to return phone call for appointment details. Pt was also sent a mychart message.

## 2022-05-09 ENCOUNTER — Telehealth: Payer: Self-pay | Admitting: Emergency Medicine

## 2022-05-09 NOTE — Telephone Encounter (Signed)
Copied from [place] [phone]. Topic: General - Inquiry >> May 09, 2022  7:59 AM Marcellus Scott wrote: Reason for CRM: Pt stated she is off on Monday, July 24 and the 26th. Pt is asking if the MRI can be rescheduled. Pt stated she can come in on July 27th if she can come in at 3:30 PM.  Pt requesting a call back.

## 2022-05-16 ENCOUNTER — Encounter (HOSPITAL_BASED_OUTPATIENT_CLINIC_OR_DEPARTMENT_OTHER)
Admission: RE | Admit: 2022-05-16 | Discharge: 2022-05-16 | Disposition: A | Discharge status: Home / Self Care | Payer: 59 | Source: Ambulatory Visit | Attending: Surgery | Admitting: Surgery

## 2022-05-16 ENCOUNTER — Encounter (HOSPITAL_BASED_OUTPATIENT_CLINIC_OR_DEPARTMENT_OTHER): Payer: Self-pay | Admitting: Surgery

## 2022-05-16 ENCOUNTER — Other Ambulatory Visit: Payer: Self-pay

## 2022-05-16 DIAGNOSIS — E1121 Type 2 diabetes mellitus with diabetic nephropathy: Secondary | ICD-10-CM | POA: Diagnosis not present

## 2022-05-16 DIAGNOSIS — Z01812 Encounter for preprocedural laboratory examination: Secondary | ICD-10-CM | POA: Insufficient documentation

## 2022-05-16 LAB — BASIC METABOLIC PANEL
Anion gap: 7 (ref 5–15)
BUN: 12 mg/dL (ref 6–20)
CO2: 26 mmol/L (ref 22–32)
Calcium: 9.3 mg/dL (ref 8.9–10.3)
Chloride: 104 mmol/L (ref 98–111)
Creatinine, Ser: 0.46 mg/dL (ref 0.44–1.00)
GFR, Estimated: >60 mL/min (ref >60)
Glucose, Bld: 213 mg/dL — ABNORMAL HIGH (ref 70–99)
Potassium: 4.1 mmol/L (ref 3.5–5.1)
Sodium: 137 mmol/L (ref 135–145)

## 2022-05-17 ENCOUNTER — Ambulatory Visit: Payer: Self-pay | Admitting: Surgery

## 2022-05-17 ENCOUNTER — Ambulatory Visit (HOSPITAL_COMMUNITY): Admission: RE | Admit: 2022-05-17 | Payer: 59 | Source: Ambulatory Visit

## 2022-05-17 NOTE — Telephone Encounter (Signed)
Pt called and rescheduled her appointment to 05/31/2022

## 2022-05-21 ENCOUNTER — Encounter (HOSPITAL_BASED_OUTPATIENT_CLINIC_OR_DEPARTMENT_OTHER): Payer: Self-pay | Admitting: Surgery

## 2022-05-21 NOTE — Anesthesia Preprocedure Evaluation (Signed)
Anesthesia Evaluation  Patient identified by MRN, date of birth, ID band Patient awake    Reviewed: Allergy & Precautions, NPO status , Patient's Chart, lab work & pertinent test results  Airway Mallampati: III  TM Distance: >3 FB Neck ROM: Full    Dental  (+) Teeth Intact   Pulmonary neg pulmonary ROS,    Pulmonary exam normal        Cardiovascular hypertension, Pt. on medications and Pt. on home beta blockers Normal cardiovascular exam  Portacath indwelling   Neuro/Psych negative neurological ROS  negative psych ROS   GI/Hepatic Neg liver ROS, GERD  Medicated,  Endo/Other  diabetes, Well Controlled, Type 2, Oral Hypoglycemic AgentsHyperlipidemia Hx/o right breast Ca S/P chemoRx  Renal/GU negative Renal ROS  negative genitourinary   Musculoskeletal  (+) Arthritis , Osteoarthritis,    Abdominal   Peds  Hematology negative hematology ROS (+)   Anesthesia Other Findings Breast cancer  Reproductive/Obstetrics                           Anesthesia Physical Anesthesia Plan  ASA: 2  Anesthesia Plan: MAC   Post-op Pain Management: Minimal or no pain anticipated, Toradol IV (intra-op)* and Tylenol PO (pre-op)*   Induction: Intravenous  PONV Risk Score and Plan: 2 and Propofol infusion, TIVA and Treatment may vary due to age or medical condition  Airway Management Planned: Natural Airway, Nasal Cannula and Simple Face Mask  Additional Equipment: None  Intra-op Plan:   Post-operative Plan:   Informed Consent: I have reviewed the patients History and Physical, chart, labs and discussed the procedure including the risks, benefits and alternatives for the proposed anesthesia with the patient or authorized representative who has indicated his/her understanding and acceptance.       Plan Discussed with:   Anesthesia Plan Comments:        Anesthesia Quick Evaluation

## 2022-05-22 ENCOUNTER — Other Ambulatory Visit: Payer: Self-pay

## 2022-05-22 ENCOUNTER — Encounter (HOSPITAL_BASED_OUTPATIENT_CLINIC_OR_DEPARTMENT_OTHER): Payer: Self-pay | Admitting: Surgery

## 2022-05-22 ENCOUNTER — Ambulatory Visit (HOSPITAL_BASED_OUTPATIENT_CLINIC_OR_DEPARTMENT_OTHER): Payer: 59 | Admitting: Anesthesiology

## 2022-05-22 ENCOUNTER — Encounter (HOSPITAL_BASED_OUTPATIENT_CLINIC_OR_DEPARTMENT_OTHER)
Admission: RE | Disposition: A | Discharge status: Home / Self Care | Payer: Self-pay | Source: Home / Self Care | Attending: Surgery

## 2022-05-22 ENCOUNTER — Ambulatory Visit (HOSPITAL_BASED_OUTPATIENT_CLINIC_OR_DEPARTMENT_OTHER)
Admission: RE | Admit: 2022-05-22 | Discharge: 2022-05-22 | Disposition: A | Discharge status: Home / Self Care | Payer: 59 | Attending: Surgery | Admitting: Surgery

## 2022-05-22 DIAGNOSIS — E785 Hyperlipidemia, unspecified: Secondary | ICD-10-CM | POA: Insufficient documentation

## 2022-05-22 DIAGNOSIS — E119 Type 2 diabetes mellitus without complications: Secondary | ICD-10-CM

## 2022-05-22 DIAGNOSIS — Z452 Encounter for adjustment and management of vascular access device: Secondary | ICD-10-CM

## 2022-05-22 DIAGNOSIS — Z7984 Long term (current) use of oral hypoglycemic drugs: Secondary | ICD-10-CM | POA: Insufficient documentation

## 2022-05-22 DIAGNOSIS — I10 Essential (primary) hypertension: Secondary | ICD-10-CM | POA: Diagnosis not present

## 2022-05-22 DIAGNOSIS — C50311 Malignant neoplasm of lower-inner quadrant of right female breast: Secondary | ICD-10-CM | POA: Diagnosis not present

## 2022-05-22 DIAGNOSIS — E1121 Type 2 diabetes mellitus with diabetic nephropathy: Secondary | ICD-10-CM

## 2022-05-22 DIAGNOSIS — Z01818 Encounter for other preprocedural examination: Secondary | ICD-10-CM

## 2022-05-22 DIAGNOSIS — K219 Gastro-esophageal reflux disease without esophagitis: Secondary | ICD-10-CM | POA: Diagnosis not present

## 2022-05-22 DIAGNOSIS — Z170 Estrogen receptor positive status [ER+]: Secondary | ICD-10-CM | POA: Diagnosis not present

## 2022-05-22 HISTORY — PX: PORT-A-CATH REMOVAL: SHX5289

## 2022-05-22 LAB — GLUCOSE, CAPILLARY
Glucose-Capillary: 175 mg/dL — ABNORMAL HIGH (ref 70–99)
Glucose-Capillary: 175 mg/dL — ABNORMAL HIGH (ref 70–99)

## 2022-05-22 LAB — POCT PREGNANCY, URINE: Preg Test, Ur: NEGATIVE

## 2022-05-22 SURGERY — REMOVAL PORT-A-CATH
Anesthesia: Monitor Anesthesia Care | Site: Chest | Laterality: Right

## 2022-05-22 MED ORDER — FENTANYL CITRATE (PF) 100 MCG/2ML IJ SOLN
INTRAMUSCULAR | Status: DC | PRN
  Administered 2022-05-22: 50 ug via INTRAVENOUS

## 2022-05-22 MED ORDER — PROPOFOL 500 MG/50ML IV EMUL
INTRAVENOUS | Status: AC
  Filled 2022-05-22: qty 50

## 2022-05-22 MED ORDER — ONDANSETRON HCL 4 MG/2ML IJ SOLN
INTRAMUSCULAR | Status: AC
  Filled 2022-05-22: qty 2

## 2022-05-22 MED ORDER — LACTATED RINGERS IV SOLN: INTRAVENOUS | Status: DC

## 2022-05-22 MED ORDER — ATROPINE SULFATE 0.4 MG/ML IV SOLN
INTRAVENOUS | Status: AC
  Filled 2022-05-22: qty 2

## 2022-05-22 MED ORDER — CHLORHEXIDINE GLUCONATE CLOTH 2 % EX PADS: 6.0000 | MEDICATED_PAD | Freq: Once | CUTANEOUS | Status: DC

## 2022-05-22 MED ORDER — MIDAZOLAM HCL 2 MG/2ML IJ SOLN
INTRAMUSCULAR | Status: DC | PRN
  Administered 2022-05-22: 2 mg via INTRAVENOUS

## 2022-05-22 MED ORDER — BUPIVACAINE-EPINEPHRINE 0.25% -1:200000 IJ SOLN
INTRAMUSCULAR | Status: DC | PRN
  Administered 2022-05-22: 10 mL

## 2022-05-22 MED ORDER — PROPOFOL 500 MG/50ML IV EMUL
INTRAVENOUS | Status: DC | PRN
  Administered 2022-05-22: 100 ug/kg/min via INTRAVENOUS

## 2022-05-22 MED ORDER — ONDANSETRON HCL 4 MG/2ML IJ SOLN
INTRAMUSCULAR | Status: DC | PRN
  Administered 2022-05-22: 4 mg via INTRAVENOUS

## 2022-05-22 MED ORDER — CEFAZOLIN SODIUM-DEXTROSE 2-4 GM/100ML-% IV SOLN
2.0000 g | INTRAVENOUS | Status: AC
  Administered 2022-05-22: 2 g via INTRAVENOUS

## 2022-05-22 MED ORDER — CEFAZOLIN SODIUM-DEXTROSE 2-4 GM/100ML-% IV SOLN
INTRAVENOUS | Status: AC
  Filled 2022-05-22: qty 100

## 2022-05-22 MED ORDER — MIDAZOLAM HCL 2 MG/2ML IJ SOLN
INTRAMUSCULAR | Status: AC
  Filled 2022-05-22: qty 2

## 2022-05-22 MED ORDER — PROPOFOL 10 MG/ML IV BOLUS
INTRAVENOUS | Status: AC
  Filled 2022-05-22: qty 20

## 2022-05-22 MED ORDER — FENTANYL CITRATE (PF) 100 MCG/2ML IJ SOLN
INTRAMUSCULAR | Status: AC
  Filled 2022-05-22: qty 2

## 2022-05-22 SURGICAL SUPPLY — 35 items
ADH SKN CLS APL DERMABOND .7 (GAUZE/BANDAGES/DRESSINGS) ×1
APL PRP STRL LF DISP 70% ISPRP (MISCELLANEOUS) ×1
APL SKNCLS STERI-STRIP NONHPOA (GAUZE/BANDAGES/DRESSINGS)
BENZOIN TINCTURE PRP APPL 2/3 (GAUZE/BANDAGES/DRESSINGS) IMPLANT
BLADE SURG 15 STRL LF DISP TIS (BLADE) ×2 IMPLANT
BLADE SURG 15 STRL SS (BLADE) ×2
CHLORAPREP W/TINT 26 (MISCELLANEOUS) ×3 IMPLANT
COVER BACK TABLE 60X90IN (DRAPES) ×3 IMPLANT
COVER MAYO STAND STRL (DRAPES) ×3 IMPLANT
DERMABOND ADVANCED (GAUZE/BANDAGES/DRESSINGS) ×1
DERMABOND ADVANCED .7 DNX12 (GAUZE/BANDAGES/DRESSINGS) ×2 IMPLANT
DRAPE LAPAROTOMY 100X72 PEDS (DRAPES) ×3 IMPLANT
DRAPE UTILITY XL STRL (DRAPES) ×3 IMPLANT
ELECT REM PT RETURN 9FT ADLT (ELECTROSURGICAL)
ELECTRODE REM PT RTRN 9FT ADLT (ELECTROSURGICAL) ×2 IMPLANT
GLOVE BIOGEL PI IND STRL 8 (GLOVE) ×2 IMPLANT
GLOVE BIOGEL PI INDICATOR 8 (GLOVE) ×1
GLOVE ECLIPSE 8.0 STRL XLNG CF (GLOVE) ×3 IMPLANT
GOWN STRL REUS W/ TWL LRG LVL3 (GOWN DISPOSABLE) ×4 IMPLANT
GOWN STRL REUS W/ TWL XL LVL3 (GOWN DISPOSABLE) ×2 IMPLANT
GOWN STRL REUS W/TWL LRG LVL3 (GOWN DISPOSABLE) ×4
GOWN STRL REUS W/TWL XL LVL3 (GOWN DISPOSABLE) ×2
NDL HYPO 25X1 1.5 SAFETY (NEEDLE) ×2 IMPLANT
NEEDLE HYPO 25X1 1.5 SAFETY (NEEDLE) ×2 IMPLANT
NS IRRIG 1000ML POUR BTL (IV SOLUTION) ×3 IMPLANT
PACK BASIN DAY SURGERY FS (CUSTOM PROCEDURE TRAY) ×3 IMPLANT
PENCIL SMOKE EVACUATOR (MISCELLANEOUS) IMPLANT
SLEEVE SCD COMPRESS KNEE MED (STOCKING) IMPLANT
SPIKE FLUID TRANSFER (MISCELLANEOUS) ×2 IMPLANT
SPONGE T-LAP 4X18 ~~LOC~~+RFID (SPONGE) ×3 IMPLANT
STRIP CLOSURE SKIN 1/2X4 (GAUZE/BANDAGES/DRESSINGS) IMPLANT
SUT MON AB 4-0 PC3 18 (SUTURE) ×3 IMPLANT
SUT VICRYL 3-0 CR8 SH (SUTURE) ×3 IMPLANT
SYR CONTROL 10ML LL (SYRINGE) ×3 IMPLANT
TOWEL GREEN STERILE FF (TOWEL DISPOSABLE) ×3 IMPLANT

## 2022-05-22 NOTE — Transfer of Care (Signed)
Immediate Anesthesia Transfer of Care Note  Patient: Rebecca [name]  Procedure(s) Performed: REMOVAL PORT-A-CATH (Right: Chest)  Patient Location: PACU  Anesthesia Type:MAC  Level of Consciousness: awake, alert  and oriented  Airway & Oxygen Therapy: Patient Spontanous Breathing and Patient connected to face mask oxygen  Post-op Assessment: Report given to RN and Post -op Vital signs reviewed and stable  Post vital signs: Reviewed and stable  Last Vitals:   HR: 75 RR: 14 BP: 102/63 (75) SpO2: 98% Vitals Value Taken Time  BP    Temp    Pulse    Resp    SpO2      Last Pain:  Vitals:   05/22/22 0647  TempSrc: Oral  PainSc: 0-No pain      Patients Stated Pain Goal: 4 (16/38/45 3646)  Complications: No notable events documented.

## 2022-05-22 NOTE — H&P (Signed)
Chief Complaint: Follow-up (Port removal )   History of Present Illness: Rebecca [name] is a 56 y.o. female who is seen today for long-term follow-up after treatment of her right breast cancer with neoadjuvant chemotherapy, breast conserving surgery and postop radiation therapy in 2022. She no longer requires her Port-A-Cath and comes in today to discuss removal. She is doing well..   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 The report date is January 31, 2021. The CancerNext-Expanded gene panel offered by [facility] and includes sequencing, rearrangement, and RNA analysis for the following 77 genes: AIP, ALK, APC, ATM, AXIN2, BAP1, BARD1, BLM, BMPR1A, BRCA1, BRCA2, BRIP1, CDC73, CDH1, CDK4, CDKN1B, CDKN2A, CHEK2, CTNNA1, DICER1, FANCC, FH, FLCN, GALNT12, KIF1B, LZTR1, MAX, MEN1, MET, MLH1, MSH2, MSH3, MSH6, MUTYH, NBN, NF1, NF2, NTHL1, PALB2, PHOX2B, PMS2, POT1, PRKAR1A, PTCH1, PTEN, RAD51C, RAD51D, RB1, RECQL, RET, SDHA, SDHAF2, SDHB, SDHC, SDHD, SMAD4, SMARCA4, SMARCB1, SMARCE1, STK11, SUFU, TMEM127, TP53, TSC1, TSC2, VHL and  XRCC2 (sequencing and deletion/duplication); EGFR, EGLN1, HOXB13, KIT, MITF, PDGFRA, POLD1, and POLE (sequencing only); EPCAM and GREM1 (deletion/duplication only).  02/07/2021 - 05/23/2021 Neo-Adjuvant Chemotherapy neoadjuvant chemotherapy consisting of pembrolizumab given day 1 together with carboplatin and paclitaxel, with Taxol given on day 8 and 15, and filgrastim on days 16,17 and 18, started 02/07/2021, repeated every 21 days x 2, followed by doxorubicin and cyclophosphamide every 21 days x 4 also given with pembrolizumab on day 1 and PEG filgrastim day 3, started 03/22/2021 and completed 05/23/2021 (a) carboplatin/paclitaxel discontinued after the cycle 2-day 8 dose with neuropathy developing (subsequently resolved)  06/13/2021 - Chemotherapy Patient is on Treatment Plan : HEAD/NECK Pembrolizumab Q21D  07/11/2021 Surgery right lumpectomy and sentinel lymph node sampling 07/11/2021 showed a complete pathologic response (ypT0 ypN0) (a) a total of 3 right axillary lymph nodes were removed  08/16/2021 - 09/29/2021 Radiation Therapy 08/16/2021 through 09/29/2021 Site Technique Total Dose (Gy) Dose per Fx (Gy) Completed Fx Beam Energies Breast, Right: Breast_Rt_IMN 3D 50/50 2 25/25 10X Breast, Right: Breast_Rt_PAB_SCV 3D 50/50 2 25/25 10X, 15X Breast, Right: Breast_Rt_Bst 3D 10/10 2 5/5 6X, 10X  Review of Systems: A complete review of systems was obtained from the patient. I have reviewed this information and discussed as appropriate with the patient. See HPI as well for other ROS.    Medical History: No past medical history on file.  There is no problem list on file for this patient.  No past surgical history on file.   Allergies  Allergen Reactions  Lisinopril Swelling  Swelling in throat, coughing, difficulty swallowing, could not sleep. Occurred Dec 2020.   Current Outpatient Medications on File Prior to Visit  Medication Sig Dispense Refill  amLODIPine (NORVASC) 10 MG tablet  Take 1 tablet by mouth once daily  atorvastatin (LIPITOR) 40 MG tablet atorvastatin 40 mg tablet TAKE 1 TABLET (40 MG TOTAL) BY MOUTH DAILY.  chlorhexidine (PERIDEX) 0.12 % solution RINSE WITH 15MLS FOR 30 SECONDS BY MOUTH TWICE DAILY  ciprofloxacin HCl (CIPRO) 500 MG tablet  clomiPHENE (CLOMID) 50 mg tablet clomiphene citrate 50 mg tablet TAKE 1 TABLET BY MOUTH ON DAYS 3-7 OF CYCLE EACH MONTH  dapagliflozin (FARXIGA) 10 mg tablet Farxiga 10 mg tablet TAKE 1 TABLET BY MOUTH DAILY.  dexAMETHasone (DECADRON) 4 MG tablet  glipiZIDE (GLUCOTROL) 5 MG tablet Take 1 tablet by mouth once daily  ibuprofen (MOTRIN) 800 MG tablet ibuprofen 800 mg tablet TAKE 1 TABLET (800 MG TOTAL) BY MOUTH 2 (TWO) TIMES DAILY AS NEEDED.  labetaloL (TRANDATE) 100 MG tablet labetalol 100 mg tablet TAKE 1 TABLET (100 MG TOTAL) BY MOUTH 2 (TWO) TIMES DAILY.  metFORMIN (GLUCOPHAGE) 500 MG tablet metformin 500 mg tablet TAKE 2 TABLETS (1,000 MG TOTAL) BY MOUTH 2 (TWO) TIMES DAILY WITH A MEAL.  terconazole (TERAZOL 7) 0.4 % vaginal cream terconazole 0.4 % vaginal cream INSERT 1 APPLICATORFUL VIA VAGINAL ROUTE NIGHTLY FOR 7 DAYS.  valACYclovir (VALTREX) 500 MG tablet Take by mouth   No current facility-administered medications on file prior to visit.   No family history on file.   Social History   Tobacco Use  Smoking Status Never  Smokeless Tobacco Never    Social History   Socioeconomic History  Marital status: Divorced  Tobacco Use  Smoking status: Never  Smokeless tobacco: Never   Objective:   There were no vitals filed for this visit.  There is no height or weight on file to calculate BMI.  Physical Exam HENT:  Head: Normocephalic.  Cardiovascular:  Rate and Rhythm: Normal rate.  Pulmonary:  Effort: Pulmonary effort is normal.  Chest:  Breasts: Left: Normal.   Comments: Port in place  Incisions intact right breast   Skin: General: Skin is warm.  Neurological:  General: No focal  deficit present.  Mental Status: She is alert.    Assessment and Plan:   Diagnoses and all orders for this visit:  Port-A-Cath in place    Plan port removal  Risk of bleeding, infection, catheter fragmentation, catheter migration, catheter embolization, anesthesia risk discussed. She agrees to proceed.  No follow-ups on file.  Kennieth Francois, MD

## 2022-05-22 NOTE — Discharge Instructions (Addendum)
Post Anesthesia Home Care Instructions  Activity: Get plenty of rest for the remainder of the day. A responsible individual must stay with you for 24 hours following the procedure.  For the next 24 hours, DO NOT: -Drive a car -Paediatric nurse -Drink alcoholic beverages -Take any medication unless instructed by your physician -Make any legal decisions or sign important papers.  Meals: Start with liquid foods such as gelatin or soup. Progress to regular foods as tolerated. Avoid greasy, spicy, heavy foods. If nausea and/or vomiting occur, drink only clear liquids until the nausea and/or vomiting subsides. Call your physician if vomiting continues.  Special Instructions/Symptoms: Your throat may feel dry or sore from the anesthesia or the breathing tube placed in your throat during surgery. If this causes discomfort, gargle with warm salt water. The discomfort should disappear within 24 hours.   Post Anesthesia Home Care Instructions  Activity: Get plenty of rest for the remainder of the day. A responsible individual must stay with you for 24 hours following the procedure.  For the next 24 hours, DO NOT: -Drive a car -Paediatric nurse -Drink alcoholic beverages -Take any medication unless instructed by your physician -Make any legal decisions or sign important papers.  Meals: Start with liquid foods such as gelatin or soup. Progress to regular foods as tolerated. Avoid greasy, spicy, heavy foods. If nausea and/or vomiting occur, drink only clear liquids until the nausea and/or vomiting subsides. Call your physician if vomiting continues.  Special Instructions/Symptoms: Your throat may feel dry or sore from the anesthesia or the breathing tube placed in your throat during surgery. If this causes discomfort, gargle with warm salt water. The discomfort should disappear within 24 hours.  #######################################################  GENERAL SURGERY: POST OP  INSTRUCTIONS  ######################################################################  EAT Gradually transition to a high fiber diet with a fiber supplement over the next few weeks after discharge.  Start with a pureed / full liquid diet (see below)  WALK Walk an hour a day.  Control your pain to do that.    CONTROL PAIN Control pain so that you can walk, sleep, tolerate sneezing/coughing, go up/down stairs.  HAVE A BOWEL MOVEMENT DAILY Keep your bowels regular to avoid problems.  OK to try a laxative to override constipation.  OK to use an antidairrheal to slow down diarrhea.  Call if not better after 2 tries  CALL IF YOU HAVE PROBLEMS/CONCERNS Call if you are still struggling despite following these instructions. Call if you have concerns not answered by these instructions  ######################################################################    DIET: Follow a light bland diet & liquids the first 24 hours after arrival home, such as soup, liquids, starches, etc.  Be sure to drink plenty of fluids.  Quickly advance to a usual solid diet within a few days.  Avoid fast food or heavy meals as your are more likely to get nauseated or have irregular bowels.  A low-fat, high-fiber diet for the rest of your life is ideal.    Take your usually prescribed home medications unless otherwise directed.  PAIN CONTROL: Pain is best controlled by a usual combination of three different methods TOGETHER: Ice/Heat Over the counter pain medication Prescription pain medication Most patients will experience some swelling and bruising around the incisions.  Ice packs or heating pads (30-60 minutes up to 6 times a day) will help. Use ice for the first few days to help decrease swelling and bruising, then switch to heat to help relax tight/sore spots and speed recovery.  Some people  prefer to use ice alone, heat alone, alternating between ice & heat.  Experiment to what works for you.  Swelling and bruising  can take several weeks to resolve.   It is helpful to take an over-the-counter pain medication regularly for the first few weeks.  Choose one of the following that works best for you: Naproxen (Aleve, etc)  Two '220mg'$  tabs twice a day Ibuprofen (Advil, etc) Three '200mg'$  tabs four times a day (every meal & bedtime) Acetaminophen (Tylenol, etc) 500-'650mg'$  four times a day (every meal & bedtime) A  prescription for pain medication (such as oxycodone, hydrocodone, etc) should be given to you upon discharge.  Take your pain medication as prescribed.  If you are having problems/concerns with the prescription medicine (does not control pain, nausea, vomiting, rash, itching, etc), please call us [phone] to see if we need to switch you to a different pain medicine that will work better for you and/or control your side effect better. If you need a refill on your pain medication, please contact your pharmacy.  They will contact our office to request authorization. Prescriptions will not be filled after 5 pm or on week-ends.  Avoid getting constipated.  Between the surgery and the pain medications, it is common to experience some constipation.  Increasing fluid intake and taking a fiber supplement (such as Metamucil, Citrucel, FiberCon, MiraLax, etc) 1-2 times a day regularly will usually help prevent this problem from occurring.  A mild laxative (prune juice, Milk of Magnesia, MiraLax, etc) should be taken according to package directions if there are no bowel movements after 48 hours.   Watch out for diarrhea.  If you have many loose bowel movements, simplify your diet to bland foods & liquids for a few days.  Stop any stool softeners and decrease your fiber supplement.  Switching to mild anti-diarrheal medications (Loperamide/Imodium, Kayopectate, Pepto Bismol) can help.  If this worsens or does not improve, please call us.  Wash / shower every day.  You may shower over the dressings as they are waterproof.   Continue to shower over incision(s) after the dressing is off. Remove your waterproof bandages 5 days after surgery.  You may leave the incision open to air.  You may have skin tapes (Steri Strips) covering the incision(s).  Leave them on until one week, then remove.  You may replace a dressing/Band-Aid to cover the incision for comfort if you wish.   ACTIVITIES as tolerated:   You may resume regular (light) daily activities beginning the next day--such as daily self-care, walking, climbing stairs--gradually increasing activities as tolerated.  If you can walk 30 minutes without difficulty, it is safe to try more intense activity such as jogging, treadmill, bicycling, low-impact aerobics, swimming, etc. Save the most intensive and strenuous activity for last such as sit-ups, heavy lifting, contact sports, etc  Refrain from any heavy lifting or straining until you are off narcotics for pain control.   DO NOT PUSH THROUGH PAIN.  Let pain be your guide: If it hurts to do something, don't do it.  Pain is your body warning you to avoid that activity for another week until the pain goes down. You may drive when you are no longer taking prescription pain medication, you can comfortably wear a seatbelt, and you can safely maneuver your car and apply brakes. You may have sexual intercourse when it is comfortable.   FOLLOW UP in our office Please call [facility] at [phone] to set up an appointment to  see your surgeon in the office for a follow-up appointment approximately 2-3 weeks after your surgery. Make sure that you call for this appointment the day you arrive home to insure a convenient appointment time.  9. IF YOU HAVE DISABILITY OR FAMILY LEAVE FORMS, BRING THEM TO THE OFFICE FOR PROCESSING.  DO NOT GIVE THEM TO YOUR DOCTOR.   WHEN TO CALL us [phone]: Poor pain control Reactions / problems with new medications (rash/itching, nausea, etc)  Fever over 101.5 F (38.5 C) Worsening swelling or  bruising Continued bleeding from incision. Increased pain, redness, or drainage from the incision Difficulty breathing / swallowing   The clinic staff is available to answer your questions during regular business hours (8:30am-5pm).  Please don't hesitate to call and ask to speak to one of our nurses for clinical concerns.   If you have a medical emergency, go to the nearest emergency room or call 911.  A surgeon from [facility] is always on call at the [facility], [place], [place], [place], [place]  [id number] ? MAIN: [phone] ? TOLL FREE: [phone] ?  FAX [phone] [email or web address]  #######################################################

## 2022-05-22 NOTE — Op Note (Signed)

## 2022-05-22 NOTE — Anesthesia Postprocedure Evaluation (Signed)
Anesthesia Post Note  Patient: Turner A Gleaves  Procedure(s) Performed: REMOVAL PORT-A-CATH (Right: Chest)     Patient location during evaluation: PACU Anesthesia Type: MAC Level of consciousness: awake and alert Pain management: pain level controlled Vital Signs Assessment: post-procedure vital signs reviewed and stable Respiratory status: spontaneous breathing, nonlabored ventilation and respiratory function stable Cardiovascular status: blood pressure returned to baseline and stable Postop Assessment: no apparent nausea or vomiting Anesthetic complications: no   No notable events documented.  Last Vitals:  Vitals:   05/22/22 0815 05/22/22 0836  BP: 101/65 119/74  Pulse: 73 79  Resp: 13 14  Temp:  36.4 C  SpO2: 97% 97%    Last Pain:  Vitals:   05/22/22 0836  TempSrc:   PainSc: 0-No pain                 Lidia Collum

## 2022-05-22 NOTE — Interval H&P Note (Signed)
History and Physical Interval Note:  05/22/2022 7:09 AM  Rebecca [name]  has presented today for surgery, with the diagnosis of PORT IN PLACE.  The various methods of treatment have been discussed with the patient and family. After consideration of risks, benefits and other options for treatment, the patient has consented to  Procedure(s): REMOVAL PORT-A-CATH (N/A) as a surgical intervention.  The patient's history has been reviewed, patient examined, no change in status, stable for surgery.  I have reviewed the patient's chart and labs.  Questions were answered to the patient's satisfaction.     [place]

## 2022-05-23 ENCOUNTER — Encounter (HOSPITAL_BASED_OUTPATIENT_CLINIC_OR_DEPARTMENT_OTHER): Payer: Self-pay | Admitting: Surgery

## 2022-05-31 ENCOUNTER — Ambulatory Visit (HOSPITAL_COMMUNITY)
Admission: RE | Admit: 2022-05-31 | Discharge: 2022-05-31 | Disposition: A | Discharge status: Home / Self Care | Payer: 59 | Source: Ambulatory Visit | Attending: Family Medicine | Admitting: Family Medicine

## 2022-05-31 DIAGNOSIS — R936 Abnormal findings on diagnostic imaging of limbs: Secondary | ICD-10-CM | POA: Insufficient documentation

## 2022-05-31 MED ORDER — GADOBUTROL 1 MMOL/ML IV SOLN
6.5000 mL | Freq: Once | INTRAVENOUS | Status: AC | PRN
  Administered 2022-05-31: 6.5 mL via INTRAVENOUS

## 2022-06-25 ENCOUNTER — Other Ambulatory Visit: Payer: Self-pay | Admitting: Family Medicine

## 2022-06-27 ENCOUNTER — Other Ambulatory Visit: Payer: Self-pay | Admitting: Family Medicine

## 2022-07-08 ENCOUNTER — Other Ambulatory Visit: Payer: Self-pay | Admitting: Family Medicine

## 2022-07-08 DIAGNOSIS — E119 Type 2 diabetes mellitus without complications: Secondary | ICD-10-CM

## 2022-07-09 NOTE — Telephone Encounter (Signed)
Patient has future OV scheduled, will refill for 30 days. OV needed for further refills.  Requested Prescriptions  Pending Prescriptions Disp Refills  . FARXIGA 10 MG TABS tablet [Pharmacy Med Name: FARXIGA 10MG TABLETS] 30 tablet 0    Sig: TAKE 1 TABLET(10 MG) BY MOUTH DAILY WITH BREAKFAST     Endocrinology:  Diabetes - SGLT2 Inhibitors Failed - 07/08/2022  3:34 AM      Failed - HBA1C is between 0 and 7.9 and within 180 days    HbA1c, POC (controlled diabetic range)  Date Value Ref Range Status  06/29/2021 7.5 (A) 0.0 - 7.0 % Final         Failed - Valid encounter within last 6 months    Recent Outpatient Visits          9 months ago Type 2 diabetes mellitus without complication, without long-term current use of insulin [facility])   [place], [place], [place]   10 months ago Other acute nonsuppurative otitis media of right ear, recurrence not specified   [place], Neoma Laming B, MD   1 year ago Type 2 diabetes mellitus without complication, without long-term current use of insulin ([place])   [facility] [name], MD   1 year ago Annual physical exam   [place], [place], MD   1 year ago Encounter for screening mammogram for malignant neoplasm of breast   [facility], Flonnie Hailstone, NP      Future Appointments            In 2 days [name], MD [place] in normal range and within 360 days    Creatinine  Date Value Ref Range Status  01/30/2022 0.67 0.44 - 1.00 mg/dL Final   Creat  Date Value Ref Range Status  11/02/2016 0.50 0.50 - 1.10 mg/dL Final   Creatinine, Ser  Date Value Ref Range Status  05/16/2022 0.46 0.44 - 1.00 mg/dL Final   Creatinine, Urine  Date Value Ref Range Status  11/02/2016 141 20 - 320 mg/dL Final         Passed - eGFR in  normal range and within 360 days    GFR, Est African American  Date Value Ref Range Status  01/16/2016 >89 >=60 mL/min Final   GFR calc Af Amer  Date Value Ref Range Status  06/02/2020 118 >59 mL/min/1.73 Final    Comment:    **[facility] currently reports eGFR in compliance with the current**   recommendations of the [employer]. [facility] will   update reporting as new guidelines are published from the NKF-ASN   Task force.    GFR, Est Non African American  Date Value Ref Range Status  01/16/2016 >89 >=60 mL/min Final    Comment:      The estimated GFR is a calculation valid for adults (>=9 years old) that uses the CKD-EPI algorithm to adjust for age and sex. It is   not to be used for children, pregnant women, hospitalized patients,    patients on dialysis, or with rapidly changing kidney function. According to the NKDEP, eGFR >89 is normal, 60-89 shows mild impairment, 30-59 shows moderate impairment, 15-29 shows severe impairment and <15 is ESRD.      GFR, Estimated  Date Value Ref  Range Status  05/16/2022 >60 >60 mL/min Final    Comment:    (NOTE) Calculated using the CKD-EPI Creatinine Equation (2021)   01/30/2022 >60 >60 mL/min Final    Comment:    (NOTE) Calculated using the CKD-EPI Creatinine Equation (2021)    eGFR  Date Value Ref Range Status  12/27/2020 111 >59 mL/min/1.73 Final

## 2022-07-11 ENCOUNTER — Encounter: Payer: Self-pay | Admitting: Family Medicine

## 2022-07-11 ENCOUNTER — Ambulatory Visit: Payer: Commercial Managed Care - HMO | Attending: Family Medicine | Admitting: Family Medicine

## 2022-07-11 VITALS — BP 155/90 | HR 105 | Temp 97.9°F | Ht 63.0 in | Wt 151.2 lb

## 2022-07-11 DIAGNOSIS — Z1159 Encounter for screening for other viral diseases: Secondary | ICD-10-CM

## 2022-07-11 DIAGNOSIS — E1169 Type 2 diabetes mellitus with other specified complication: Secondary | ICD-10-CM | POA: Diagnosis not present

## 2022-07-11 DIAGNOSIS — E119 Type 2 diabetes mellitus without complications: Secondary | ICD-10-CM | POA: Diagnosis not present

## 2022-07-11 DIAGNOSIS — I152 Hypertension secondary to endocrine disorders: Secondary | ICD-10-CM

## 2022-07-11 DIAGNOSIS — G620 Drug-induced polyneuropathy: Secondary | ICD-10-CM

## 2022-07-11 DIAGNOSIS — R9389 Abnormal findings on diagnostic imaging of other specified body structures: Secondary | ICD-10-CM

## 2022-07-11 DIAGNOSIS — T451X5A Adverse effect of antineoplastic and immunosuppressive drugs, initial encounter: Secondary | ICD-10-CM

## 2022-07-11 DIAGNOSIS — E1159 Type 2 diabetes mellitus with other circulatory complications: Secondary | ICD-10-CM

## 2022-07-11 DIAGNOSIS — M160 Bilateral primary osteoarthritis of hip: Secondary | ICD-10-CM

## 2022-07-11 DIAGNOSIS — M84353A Stress fracture, unspecified femur, initial encounter for fracture: Secondary | ICD-10-CM

## 2022-07-11 DIAGNOSIS — E785 Hyperlipidemia, unspecified: Secondary | ICD-10-CM

## 2022-07-11 LAB — POCT GLYCOSYLATED HEMOGLOBIN (HGB A1C): HbA1c, POC (controlled diabetic range): 9.5 % — AB (ref 0.0–7.0)

## 2022-07-11 LAB — GLUCOSE, POCT (MANUAL RESULT ENTRY): POC Glucose: 252 mg/dl — AB (ref 70–99)

## 2022-07-11 MED ORDER — CHLORTHALIDONE 25 MG PO TABS: 25.0000 mg | ORAL_TABLET | Freq: Every day | ORAL | 1 refills | Status: DC

## 2022-07-11 MED ORDER — AMLODIPINE BESYLATE 10 MG PO TABS: 10.0000 mg | ORAL_TABLET | Freq: Every day | ORAL | 1 refills | Status: DC

## 2022-07-11 MED ORDER — DAPAGLIFLOZIN PROPANEDIOL 10 MG PO TABS: ORAL_TABLET | ORAL | 1 refills | Status: DC

## 2022-07-11 MED ORDER — ATORVASTATIN CALCIUM 80 MG PO TABS: 80.0000 mg | ORAL_TABLET | Freq: Every day | ORAL | 1 refills | Status: DC

## 2022-07-11 MED ORDER — GLIPIZIDE 10 MG PO TABS: 10.0000 mg | ORAL_TABLET | Freq: Every day | ORAL | 6 refills | Status: DC

## 2022-07-11 MED ORDER — METFORMIN HCL 500 MG PO TABS: ORAL_TABLET | ORAL | 1 refills | Status: DC

## 2022-07-11 NOTE — Patient Instructions (Signed)

## 2022-07-11 NOTE — Progress Notes (Unsigned)
Subjective:  Patient ID: Rebecca [name], female    DOB: 1966-03-21  Age: 56 y.o. MRN: [id number]  CC: Diabetes   HPI Rebecca [name] is a 56 y.o. year old female with a history of type 2 diabetes mellitus (A1c 7.1), hyperlipidemia, hypertension, stage IIIB R breast ca ( cT2, CN1, cM0 triple neg ckinically but 10%ER+, completed chemo)  Interval History:  She Complains of craving sweets, A1c is 9.5.  She endorses adherence with her medications but has not been checking her blood sugars.  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 She has pain in her joints and legs and uses Tylenol which is ineffective.  This causes her to limp as well.  She points to bilateral anterior hip joints as origin of pain.  She did have MRI of her femur which revealed: IMPRESSION: Chronic stress fractures of the medial basilar femoral necks bilaterally. No associated edema to suggest an acute component. No evidence of focal bone lesion/osseous metastasis.   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   Tendinosis and peritonitis of the right distal gluteus minimus and medius tendons. No acute gluteal tendon tear.   Mild tendinosis and peritendinitis of the right proximal hamstrings.   Thickened endometrium with internal heterogeneity. Correlate with any history of abnormal uterine bleeding and recommend pelvic ultrasound, especially if postmenopausal.   Past Medical History:  Diagnosis Date   Arthritis    Breast cancer ([place])    Diabetes mellitus    Family history of lymphoma 01/18/2021   Family history of throat cancer 01/18/2021   Hyperlipidemia    Hypertension     Past Surgical History:  Procedure Laterality Date   BREAST LUMPECTOMY WITH RADIOACTIVE SEED AND SENTINEL LYMPH NODE BIOPSY Right 07/11/2021   Procedure: RIGHT BREAST LUMPECTOMY WITH RADIOACTIVE  SEED AND RIGHT SENTINEL LYMPH NODE BIOPSY;  Surgeon: Erroll Luna, MD;  Location: [facility];  Service: General;  Laterality: Right;   CESAREAN SECTION     PORT-A-CATH REMOVAL Right 05/22/2022   Procedure: REMOVAL PORT-A-CATH;  Surgeon: Erroll Luna, MD;  Location: [place];  Service: General;  Laterality: Right;   PORTACATH PLACEMENT N/A 01/26/2021   Procedure: INSERTION PORT-A-CATH;  Surgeon: Erroll Luna, MD;  Location: [place];  Service: General;  Laterality: N/A;  [place] Right 07/11/2021   Procedure: RADIOACTIVE SEED GUIDED RIGHT AXILLARY LYMPH NODE BIOPSY;  Surgeon: Erroll Luna, MD;  Location: [facility];  Service: General;  Laterality: Right;    Family History  Problem Relation Age of Onset   Diabetes Mother    Heart disease Mother    Diabetes Father    Throat cancer Father        d. 87s   Diabetes Sister    Diabetes Brother    Lymphoma Cousin        maternal cousin; d. 67s    Social History   Socioeconomic History   Marital status: Divorced    Spouse name: Not on file   Number of children: Not on file   Years of education: Not on file   Highest education level: Not on file  Occupational History   Not on file  Tobacco Use   Smoking status: Never    Passive exposure: Yes   Smokeless tobacco: Never  Vaping Use  Vaping Use: Never used  Substance and Sexual Activity   Alcohol use: No   Drug use: No   Sexual activity: Not Currently  Other Topics Concern   Not on file  Social History Narrative   Not on file   Social Determinants of Health   Financial Resource Strain: Not on file  Food Insecurity: Not on file  Transportation Needs: No Transportation Needs (01/18/2021)   PRAPARE - Transportation    Lack of Transportation (Medical): No    Lack of Transportation (Non-Medical): No  Physical Activity: Not on file  Stress: Not on file  Social Connections: Not on  file    Allergies  Allergen Reactions   Lisinopril Swelling    Swelling in throat, coughing, difficulty swallowing, could not sleep. Occurred Dec 2020.   Heparin Sod Firefighter) Lock Flush Other (See Comments)    Pork derived product    Outpatient Medications Prior to Visit  Medication Sig Dispense Refill   acetaminophen (TYLENOL) 500 MG tablet Take 1 tablet (500 mg total) by mouth 3 (three) times daily as needed (with one aleve tablet). 30 tablet 0   Ascorbic Acid (VITAMIN C) 1000 MG tablet Take 1 tablet (1,000 mg total) by mouth daily. 30 tablet 0   azelastine (ASTELIN) 0.1 % nasal spray Place 2 sprays into both nostrils 2 (two) times daily. Use in each nostril as directed 30 mL 12   Blood Glucose Monitoring Suppl (ONETOUCH VERIO REFLECT) w/Device KIT 1 kit by Does not apply route in the morning, at noon, and at bedtime. Use as directed to test blood sugar up to three times daily.E11.9 1 kit 0   cetirizine (ZYRTEC) 10 MG tablet Take 1 tablet (10 mg total) by mouth daily. 30 tablet 0   fluconazole (DIFLUCAN) 100 MG tablet Take 1 tablet (100 mg total) by mouth daily. 10 tablet 0   fluticasone (FLONASE) 50 MCG/ACT nasal spray Place 2 sprays into both nostrils daily. 15.8 mL 2   gabapentin (NEURONTIN) 100 MG capsule Take 1 capsule (100 mg total) by mouth 2 (two) times daily. Take early in the morning and at midday. 60 capsule 6   gabapentin (NEURONTIN) 300 MG capsule Take 1 capsule (300 mg total) by mouth at bedtime. 90 capsule 4   glucose blood (ONETOUCH VERIO) test strip Use as directed to test blood sugar up to three times daily. E11.9 100 each 6   labetalol (NORMODYNE) 100 MG tablet Take 100 mg by mouth 2 (two) times daily.     Lancets (ONETOUCH DELICA PLUS [id number]) MISC 1 Device by Does not apply route in the morning, at noon, and at bedtime. Use as directed to test blood sugar up to three times daily. E11.9 100 each 6   methocarbamol (ROBAXIN) 500 MG tablet Take 1 tablet (500 mg total) by  mouth every 8 (eight) hours as needed for muscle spasms. 90 tablet 0   naproxen sodium (ALEVE) 220 MG tablet Take 1 tablet (220 mg total) by mouth 3 (three) times daily as needed (with one extra strength tylenol 560m).     nystatin (MYCOSTATIN) 100000 UNIT/ML suspension Take 5 mLs (500,000 Units total) by mouth 4 (four) times daily. 60 mL 0   Omega-3 1000 MG CAPS Take 1 capsule (1,000 mg total) by mouth daily. 30 capsule 0   omeprazole (PRILOSEC) 20 MG capsule Take 1 capsule (20 mg total) by mouth daily. 30 capsule 0   vitamin B-12 (CYANOCOBALAMIN) 100 MCG tablet Take 1 tablet (100 mcg total) by  mouth daily. 30 tablet 0   amLODipine (NORVASC) 10 MG tablet TAKE 1 TABLET BY MOUTH EVERY DAY 30 tablet 0   atorvastatin (LIPITOR) 80 MG tablet TAKE 1 TABLET BY MOUTH EVERY DAY 30 tablet 0   FARXIGA 10 MG TABS tablet TAKE 1 TABLET(10 MG) BY MOUTH DAILY WITH BREAKFAST 30 tablet 0   glipiZIDE (GLUCOTROL) 5 MG tablet TAKE 1 TABLET BY MOUTH DAILY 90 tablet 0   metFORMIN (GLUCOPHAGE) 500 MG tablet TAKE 2 TABLETS(1000 MG) BY MOUTH TWICE DAILY 360 tablet 0   No facility-administered medications prior to visit.     ROS Review of Systems  Constitutional:  Negative for activity change and appetite change.  HENT:  Negative for sinus pressure and sore throat.   Respiratory:  Negative for chest tightness, shortness of breath and wheezing.   Cardiovascular:  Negative for chest pain and palpitations.  Gastrointestinal:  Negative for abdominal distention, abdominal pain and constipation.  Genitourinary: Negative.   Musculoskeletal:        See HPI  Psychiatric/Behavioral:  Negative for behavioral problems and dysphoric mood.     Objective:  BP (!) 155/90   Pulse (!) 105   Temp 97.9 F (36.6 C) (Oral)   Ht 5' 3" (1.6 m)   Wt 151 lb 3.2 oz (68.6 kg)   SpO2 99%   BMI 26.78 kg/m      07/11/2022    3:50 PM 05/22/2022    8:36 AM 05/22/2022    8:15 AM  BP/Weight  Systolic BP 161 096 045  Diastolic BP 90 74  65  Wt. (Lbs) 151.2    BMI 26.78 kg/m2        Physical Exam Constitutional:      Appearance: She is well-developed.  Cardiovascular:     Rate and Rhythm: Normal rate.     Heart sounds: Normal heart sounds. No murmur heard. Pulmonary:     Effort: Pulmonary effort is normal.     Breath sounds: Normal breath sounds. No wheezing or rales.  Chest:     Chest wall: No tenderness.  Abdominal:     General: Bowel sounds are normal. There is no distension.     Palpations: Abdomen is soft. There is no mass.     Tenderness: There is no abdominal tenderness.  Musculoskeletal:        General: Tenderness present.     Right lower leg: No edema.     Left lower leg: No edema.     Comments: Tenderness in bilateral anterior hip joint and range of motion of both lower extremities  Neurological:     Mental Status: She is alert and oriented to person, place, and time.     Gait: Gait abnormal.  Psychiatric:        Mood and Affect: Mood normal.        Latest Ref Rng & Units 05/16/2022   11:30 AM 04/15/2022    9:43 AM 01/30/2022    9:21 AM  CMP  Glucose 70 - 99 mg/dL 213  178  245   BUN 6 - 20 mg/dL _0 Creatinine 0.44 - 1.00 mg/dL 0.46  0.47  0.67   Sodium 135 - 145 mmol/L 137  137  138   Potassium 3.5 - 5.1 mmol/L 4.1  4.0  4.1   Chloride 98 - 111 mmol/L 104  100  99   CO2 22 - 32 mmol/L _1 Calcium 8.9 -  10.3 mg/dL 9.3  9.3  9.3   Total Protein 6.5 - 8.1 g/dL   7.1   Total Bilirubin 0.3 - 1.2 mg/dL   0.3   Alkaline Phos 38 - 126 U/L   79   AST 15 - 41 U/L   26   ALT 0 - 44 U/L   25     Lipid Panel     Component Value Date/Time   CHOL 116 11/11/2020 0908   TRIG 178 (H) 11/11/2020 0908   HDL 46 11/11/2020 0908   CHOLHDL 2.5 11/11/2020 0908   CHOLHDL 5.5 (H) 11/02/2016 1102   VLDL 80 (H) 11/02/2016 1102   LDLCALC 41 11/11/2020 0908    CBC    Component Value Date/Time   WBC 4.8 04/15/2022 0943   RBC 5.40 (H) 04/15/2022 0943   HGB 13.8 04/15/2022 0943   HGB  13.5 01/30/2022 0921   HCT 44.0 04/15/2022 0943   PLT 247 04/15/2022 0943   PLT 232 01/30/2022 0921   MCV 81.5 04/15/2022 0943   MCH 25.6 (L) 04/15/2022 0943   MCHC 31.4 04/15/2022 0943   RDW 13.9 04/15/2022 0943   LYMPHSABS 1.1 01/30/2022 0921   MONOABS 0.5 01/30/2022 0921   EOSABS 0.2 01/30/2022 0921   BASOSABS 0.0 01/30/2022 0921    Lab Results  Component Value Date   HGBA1C 9.5 (A) 07/11/2022    Assessment & Plan:  1. Type 2 diabetes mellitus without complication, without long-term current use of insulin (HCC) Uncontrolled with A1c of 9.5 Increase glipizide dose Counseled on Diabetic diet, my plate method, 761 minutes of moderate intensity exercise/week Blood sugar logs with fasting goals of 80-120 mg/dl, random of less than 180 and in the event of sugars less than 60 mg/dl or greater than 400 mg/dl encouraged to notify the clinic. Advised on the need for annual eye exams, annual foot exams, Pneumonia vaccine. - POCT glucose (manual entry) - POCT glycosylated hemoglobin (Hb A1C) - glipiZIDE (GLUCOTROL) 10 MG tablet; Take 1 tablet (10 mg total) by mouth daily.  Dispense: 60 tablet; Refill: 6 - metFORMIN (GLUCOPHAGE) 500 MG tablet; TAKE 2 TABLETS(1000 MG) BY MOUTH TWICE DAILY  Dispense: 360 tablet; Refill: 1 - dapagliflozin propanediol (FARXIGA) 10 MG TABS tablet; TAKE 1 TABLET(10 MG) BY MOUTH DAILY WITH BREAKFAST  Dispense: 90 tablet; Refill: 1 - Microalbumin / creatinine urine ratio; Future - LP+Non-HDL Cholesterol; Future - CMP14+EGFR; Future  2. Thickened endometrium Incidental finding on MRI femur - US Pelvic Complete With Transvaginal; Future  3. Primary osteoarthritis of both hips With ongoing bilateral hip pain worsened by prolonged standing at work Placed on Mobic - AMB referral to orthopedics - VITAMIN D 25 Hydroxy (Vit-D Deficiency, Fractures); Future  4. Stress fracture of femur, unspecified laterality, initial encounter - AMB referral to orthopedics -  VITAMIN D 25 Hydroxy (Vit-D Deficiency, Fractures); Future  5. Hyperlipidemia associated with type 2 diabetes mellitus (HCC) Controlled Low-cholesterol diet - atorvastatin (LIPITOR) 80 MG tablet; Take 1 tablet (80 mg total) by mouth daily.  Dispense: 90 tablet; Refill: 1  6. Hypertension associated with diabetes ([place]) Uncontrolled Chlorthalidone added to regimen - chlorthalidone (HYGROTON) 25 MG tablet; Take 1 tablet (25 mg total) by mouth daily.  Dispense: 90 tablet; Refill: 1 - amLODipine (NORVASC) 10 MG tablet; Take 1 tablet (10 mg total) by mouth daily.  Dispense: 90 tablet; Refill: 1  7. Peripheral neuropathy due to chemotherapy (HCC) Stable Continue gabapentin  8. Need for hepatitis C screening test - HCV Ab  w Reflex to Quant PCR; Future    Meds ordered this encounter  Medications   glipiZIDE (GLUCOTROL) 10 MG tablet    Sig: Take 1 tablet (10 mg total) by mouth daily.    Dispense:  60 tablet    Refill:  6    Dose increase   chlorthalidone (HYGROTON) 25 MG tablet    Sig: Take 1 tablet (25 mg total) by mouth daily.    Dispense:  90 tablet    Refill:  1   amLODipine (NORVASC) 10 MG tablet    Sig: Take 1 tablet (10 mg total) by mouth daily.    Dispense:  90 tablet    Refill:  1   atorvastatin (LIPITOR) 80 MG tablet    Sig: Take 1 tablet (80 mg total) by mouth daily.    Dispense:  90 tablet    Refill:  1   metFORMIN (GLUCOPHAGE) 500 MG tablet    Sig: TAKE 2 TABLETS(1000 MG) BY MOUTH TWICE DAILY    Dispense:  360 tablet    Refill:  1   dapagliflozin propanediol (FARXIGA) 10 MG TABS tablet    Sig: TAKE 1 TABLET(10 MG) BY MOUTH DAILY WITH BREAKFAST    Dispense:  90 tablet    Refill:  1    Follow-up: Return in about 1 month (around 08/10/2022) for  HTN and DM.       [name], MD, FAAFP. [name] [place], [place]   07/12/2022, 1:53 PM

## 2022-07-12 MED ORDER — MELOXICAM 7.5 MG PO TABS: 7.5000 mg | ORAL_TABLET | Freq: Every day | ORAL | 1 refills | Status: DC

## 2022-07-16 ENCOUNTER — Encounter: Payer: Self-pay | Admitting: Sports Medicine

## 2022-07-16 ENCOUNTER — Ambulatory Visit (INDEPENDENT_AMBULATORY_CARE_PROVIDER_SITE_OTHER): Payer: Commercial Managed Care - HMO | Admitting: Sports Medicine

## 2022-07-16 ENCOUNTER — Ambulatory Visit: Admission: RE | Admit: 2022-07-16 | Payer: Commercial Managed Care - HMO | Source: Ambulatory Visit

## 2022-07-16 ENCOUNTER — Ambulatory Visit: Payer: Commercial Managed Care - HMO | Attending: Family Medicine

## 2022-07-16 DIAGNOSIS — M25551 Pain in right hip: Secondary | ICD-10-CM | POA: Diagnosis not present

## 2022-07-16 DIAGNOSIS — E119 Type 2 diabetes mellitus without complications: Secondary | ICD-10-CM

## 2022-07-16 DIAGNOSIS — M25571 Pain in right ankle and joints of right foot: Secondary | ICD-10-CM

## 2022-07-16 DIAGNOSIS — M160 Bilateral primary osteoarthritis of hip: Secondary | ICD-10-CM

## 2022-07-16 DIAGNOSIS — G8929 Other chronic pain: Secondary | ICD-10-CM

## 2022-07-16 DIAGNOSIS — M84353A Stress fracture, unspecified femur, initial encounter for fracture: Secondary | ICD-10-CM

## 2022-07-16 DIAGNOSIS — M25561 Pain in right knee: Secondary | ICD-10-CM | POA: Diagnosis not present

## 2022-07-16 DIAGNOSIS — M25552 Pain in left hip: Secondary | ICD-10-CM

## 2022-07-16 DIAGNOSIS — Z1159 Encounter for screening for other viral diseases: Secondary | ICD-10-CM

## 2022-07-16 DIAGNOSIS — M25562 Pain in left knee: Secondary | ICD-10-CM

## 2022-07-16 MED ORDER — MELOXICAM 15 MG PO TABS
15.0000 mg | ORAL_TABLET | Freq: Every day | ORAL | 1 refills | Status: DC
Start: 2022-07-16 — End: 2022-09-01

## 2022-07-16 NOTE — Progress Notes (Signed)
Pain when she is standing/walking  Pain is primarily at the knee/ankle Was told she had infection..does not know where.

## 2022-07-16 NOTE — Progress Notes (Signed)
[name] - 56 y.o. female MRN [id number]  Date of birth: 23-Mar-1966  Office Visit Note: Visit Date: 07/16/2022 PCP: [name], MD Referred by: [name], MD  Subjective: Chief Complaint  Patient presents with   Right Hip - Pain   HPI: Rebecca [name] is a pleasant 56 y.o. female who presents today for bilateral hip pain ( R > L), bilateral knee pain.   Patient has had bilateral leg with hip and knee pain for the last 6 years or so.  The pain on the right hip has been bothering her more recently over the coming 3 months.  She did undergo x-ray and MRI which showed stress fracture of the femoral neck, right and left.  She takes Tylenol occasionally without much relief of pain.  He does stand for long peers of time, as she works at [employer] and this does worsen her pain.  She does take over-the-counter supplements such as vitamin D, vitamin B and magnesium although and has never been diagnosed with osteoporosis/osteopenia per her report.  She has never had injection therapy performed.  She denies any radicular pain but states she has pain in both knees as well as pain in the right ankle.  She can occasionally report some swelling but no redness.  She does have type 2 diabetes.  Last A1c on 07/11/22 was 9.5.  She had been treated with chemo and radiation for history of breast cancer.  She underwent nuclear med bone scan of the whole body on 11/28/2021 which did not reveal any evidence of skeletal metastatic disease.  Pertinent ROS were reviewed with the patient and found to be negative unless otherwise specified above in HPI.   Assessment & Plan: Visit Diagnoses:  1. Femoral neck stress fracture, initial encounter   2. Chronic pain of both knees   3. Bilateral hip pain   4. Pain in right ankle and joints of right foot    Plan: Rebecca [name] has pains throughout all joints of her lower extremity, and it is somewhat difficult to ascertain what her main pain driving force is.   I did review her MRI with her and her husband today, which show chronic appearing femoral neck stress fractures.  These do not spend greater than 50% with of the femoral neck.  She has not had any weightbearing restrictions to this point.  Internal and external rotation about the hip does not reproduce her pain much, her pain is more so of bilateral knees.  I did recommend seated work only for the next 3 weeks and recommended use of a cane to help with partial weightbearing.  Given the bilateral nature of the stress fractures and her history of chemo and radiation, I do think it is pertinent to obtain information on her bone density quality.  We will order a bone DEXA scan and have her follow-up for these results.  Given the extent of her pains throughout, will start on meloxicam 50 mg once daily and see her response.  I did discuss we will have to follow her stress fractures to ensure that these are healing and not worsening as there is always a chance these could require operative fixation.  If this continues to bother her, I may have her see one of my surgical partners to follow these.  I will get her started in formalized physical therapy for the chronic stress fractures in her bilateral knee pain as well.  We may consider a trial of corticosteroid injection into one  of the knees at next visit, but I would like her to control her sugar levels as her last A1c was 9.5.  Follow-up: Return for Follow-up 1-2 weeks after DEXA scan (bone imaging).   Meds & Orders:  Meds ordered this encounter  Medications   meloxicam (MOBIC) 15 MG tablet    Sig: Take 1 tablet (15 mg total) by mouth daily.    Dispense:  30 tablet    Refill:  1    Orders Placed This Encounter  Procedures   DG BONE DENSITY (DXA)   Ambulatory referral to Physical Therapy     Procedures: No procedures performed      Clinical History: No specialty comments available.  She reports that she has never smoked. She has been exposed to  tobacco smoke. She has never used smokeless tobacco.  Recent Labs    07/11/22 1553  HGBA1C 9.5*    Objective:    Physical Exam  Gen: Well-appearing, in no acute distress; non-toxic CV: Regular Rate. Well-perfused. Warm.  Resp: Breathing unlabored on room air; no wheezing. Psych: Fluid speech in conversation; appropriate affect; normal thought process Neuro: Sensation intact throughout. No gross coordination deficits.   Ortho Exam -Bilateral hips: She has fluid internal and external rotation without any impingement.  [facility] FADIR testing does reproduce some pain in the right hip, none in the left hip.   -Bilateral knees: Inspection yields no erythema or swelling.  There is some mild crepitation of the right knee compared to the left.  Full range of motion from 0-135 degrees.  There is no ligamental laxity or instability.  There is some mild joint line TTP on the medial side of the right knee.  There is some quadricep weakness bilaterally, although no significant muscle atrophy.  Neurovascular intact distally.  Imaging:  *Independent review of the MRI of the right femur on 05/31/2022 was reviewed independently by myself.  There are chronic stress fractures of bilateral medial femoral neck.  No associated bone marrow edema.  The right hip has about 25-30% femoral neck with involvement, the left is incompletely reviewed from the MRI but does not appear to be more than 40% of femoral neck width.   MR FEMUR RIGHT W WO CONTRAST CLINICAL DATA:  Lucency on right femoral x-ray. History of breast cancer  EXAM: MRI OF THE RIGHT FEMUR WITHOUT AND WITH CONTRAST  TECHNIQUE: Multiplanar, multisequence MR imaging of the right femur was performed both before and after administration of intravenous contrast.  CONTRAST:  6.44m GADAVIST GADOBUTROL 1 MMOL/ML IV SOLN  COMPARISON:  Right femur radiograph 04/23/2022, abdominal radiograph 09/18/2020.  FINDINGS: Bones/Joint/Cartilage  There are  chronic stress fractures of the medial basilar femoral necks. There is no associated marrow edema. There is corresponds to the lucency and surrounding sclerosis a recent right femur radiograph. Sclerosis of the femoral necks is also seen on prior abdominal radiograph November 2021. There is moderate bilateral osteoarthritis with probable degenerative anterior superior labral tearing. No evidence of acute fracture or avascular necrosis.  Muscles and Tendons  There is tendinosis and peritendinitis of the right distal gluteus minimus and to a lesser degree the medius tendon. No acute tendon tear. Mild tendinosis and peritendinitis of the right proximal hamstrings. No significant muscle edema or muscle atrophy.  Soft tissues  No focal fluid collection. No evidence of soft tissue mass. Thickened endometrium with internal heterogeneity. There is a 0.8 cm subserosal fibroid anteriorly.  IMPRESSION: Chronic stress fractures of the medial basilar femoral necks bilaterally. No  associated edema to suggest an acute component. No evidence of focal bone lesion/osseous metastasis.  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  Tendinosis and peritonitis of the right distal gluteus minimus and medius tendons. No acute gluteal tendon tear.  Mild tendinosis and peritendinitis of the right proximal hamstrings.  Thickened endometrium with internal heterogeneity. Correlate with any history of abnormal uterine bleeding and recommend pelvic ultrasound, especially if postmenopausal.  Electronically Signed   By: Maurine Simmering M.D.   On: 06/01/2022 09:53    Past Medical/Family/Surgical/Social History: Medications & Allergies reviewed per EMR, new medications updated. Patient Active Problem List   Diagnosis Date Noted   Chest pain 01/30/2022   Arthralgia of shoulder 12/11/2021   Right otitis media 09/26/2021   Port-A-Cath in place 02/07/2021   Genetic  testing 02/02/2021   Family history of throat cancer 01/18/2021   Family history of lymphoma 01/18/2021   Malignant neoplasm of lower-inner quadrant of right breast of female, estrogen receptor positive ([place]) 01/12/2021   Posterior tibial tendinitis, right leg 01/26/2020   Diabetes ([place]) 03/11/2017   Muscle pain 08/06/2013   Tiredness 08/06/2013   Candida vaginitis 08/19/2012   Dental cavities 08/19/2012   Knee pain, bilateral 04/16/2012   Diabetes mellitus type 2, controlled ([place]) 04/16/2012   Hyperlipidemia 04/16/2012   Palpitations 04/16/2012   Back pain 04/16/2012   Past Medical History:  Diagnosis Date   Arthritis    Breast cancer ([place])    Diabetes mellitus    Family history of lymphoma 01/18/2021   Family history of throat cancer 01/18/2021   Hyperlipidemia    Hypertension    Family History  Problem Relation Age of Onset   Diabetes Mother    Heart disease Mother    Diabetes Father    Throat cancer Father        d. 16s   Diabetes Sister    Diabetes Brother    Lymphoma Cousin        maternal cousin; d. 65s   Past Surgical History:  Procedure Laterality Date   BREAST LUMPECTOMY WITH RADIOACTIVE SEED AND SENTINEL LYMPH NODE BIOPSY Right 07/11/2021   Procedure: RIGHT BREAST LUMPECTOMY WITH RADIOACTIVE SEED AND RIGHT SENTINEL LYMPH NODE BIOPSY;  Surgeon: Erroll Luna, MD;  Location: [facility];  Service: General;  Laterality: Right;   CESAREAN SECTION     PORT-A-CATH REMOVAL Right 05/22/2022   Procedure: REMOVAL PORT-A-CATH;  Surgeon: Erroll Luna, MD;  Location: [place];  Service: General;  Laterality: Right;   PORTACATH PLACEMENT N/A 01/26/2021   Procedure: INSERTION PORT-A-CATH;  Surgeon: Erroll Luna, MD;  Location: [place];  Service: General;  Laterality: N/A;  60   RADIOACTIVE SEED GUIDED AXILLARY SENTINEL LYMPH NODE Right 07/11/2021   Procedure: RADIOACTIVE SEED GUIDED RIGHT AXILLARY LYMPH NODE BIOPSY;  Surgeon: Erroll Luna,  MD;  Location: [facility];  Service: General;  Laterality: Right;   Social History   Occupational History   Not on file  Tobacco Use   Smoking status: Never    Passive exposure: Yes   Smokeless tobacco: Never  Vaping Use   Vaping Use: Never used  Substance and Sexual Activity   Alcohol use: No   Drug use: No   Sexual activity: Not Currently

## 2022-07-17 ENCOUNTER — Other Ambulatory Visit: Payer: Self-pay | Admitting: Family Medicine

## 2022-07-17 LAB — MICROALBUMIN / CREATININE URINE RATIO
Creatinine, Urine: 67.1 mg/dL
Microalb/Creat Ratio: 10 mg/g creat (ref 0–29)
Microalbumin, Urine: 7 ug/mL

## 2022-07-17 LAB — CMP14+EGFR
ALT: 40 IU/L — ABNORMAL HIGH (ref 0–32)
AST: 43 IU/L — ABNORMAL HIGH (ref 0–40)
Albumin/Globulin Ratio: 2 (ref 1.2–2.2)
Albumin: 5.3 g/dL — ABNORMAL HIGH (ref 3.8–4.9)
Alkaline Phosphatase: 105 IU/L (ref 44–121)
BUN/Creatinine Ratio: 30 — ABNORMAL HIGH (ref 9–23)
BUN: 19 mg/dL (ref 6–24)
Bilirubin Total: 0.5 mg/dL (ref 0.0–1.2)
CO2: 26 mmol/L (ref 20–29)
Calcium: 10 mg/dL (ref 8.7–10.2)
Chloride: 88 mmol/L — ABNORMAL LOW (ref 96–106)
Creatinine, Ser: 0.63 mg/dL (ref 0.57–1.00)
Globulin, Total: 2.7 g/dL (ref 1.5–4.5)
Glucose: 196 mg/dL — ABNORMAL HIGH (ref 70–99)
Potassium: 3.2 mmol/L — ABNORMAL LOW (ref 3.5–5.2)
Sodium: 136 mmol/L (ref 134–144)
Total Protein: 8 g/dL (ref 6.0–8.5)
eGFR: 106 mL/min/{1.73_m2} (ref >59)

## 2022-07-17 LAB — HCV AB W REFLEX TO QUANT PCR: HCV Ab: NONREACTIVE

## 2022-07-17 LAB — LP+NON-HDL CHOLESTEROL
Cholesterol, Total: 309 mg/dL — ABNORMAL HIGH (ref 100–199)
HDL: 44 mg/dL (ref >39)
LDL Chol Calc (NIH): 153 mg/dL — ABNORMAL HIGH (ref 0–99)
Total Non-HDL-Chol (LDL+VLDL): 265 mg/dL — ABNORMAL HIGH (ref 0–129)
Triglycerides: 577 mg/dL (ref 0–149)
VLDL Cholesterol Cal: 112 mg/dL — ABNORMAL HIGH (ref 5–40)

## 2022-07-17 LAB — HCV INTERPRETATION

## 2022-07-17 LAB — VITAMIN D 25 HYDROXY (VIT D DEFICIENCY, FRACTURES): Vit D, 25-Hydroxy: 27.2 ng/mL — ABNORMAL LOW (ref 30.0–100.0)

## 2022-07-17 MED ORDER — VALSARTAN 40 MG PO TABS: 40.0000 mg | ORAL_TABLET | Freq: Every day | ORAL | 3 refills | Status: DC

## 2022-07-17 MED ORDER — ERGOCALCIFEROL 1.25 MG (50000 UT) PO CAPS: 50000.0000 [IU] | ORAL_CAPSULE | ORAL | 1 refills | Status: AC

## 2022-07-17 MED ORDER — ROSUVASTATIN CALCIUM 20 MG PO TABS: 20.0000 mg | ORAL_TABLET | Freq: Every day | ORAL | 1 refills | Status: DC

## 2022-07-18 ENCOUNTER — Telehealth: Payer: Self-pay | Admitting: Hematology and Oncology

## 2022-07-18 NOTE — Telephone Encounter (Signed)
Contacted patient to scheduled appointments. Left message with appointment details and a call back number if patient had any questions or could not accommodate the time we provided.   

## 2022-07-23 ENCOUNTER — Ambulatory Visit
Admission: RE | Admit: 2022-07-23 | Discharge: 2022-07-23 | Disposition: A | Discharge status: Home / Self Care | Payer: Commercial Managed Care - HMO | Source: Ambulatory Visit | Attending: Family Medicine | Admitting: Family Medicine

## 2022-07-23 ENCOUNTER — Other Ambulatory Visit: Payer: Self-pay | Admitting: Family Medicine

## 2022-07-23 DIAGNOSIS — D259 Leiomyoma of uterus, unspecified: Secondary | ICD-10-CM

## 2022-07-23 DIAGNOSIS — R9389 Abnormal findings on diagnostic imaging of other specified body structures: Secondary | ICD-10-CM | POA: Insufficient documentation

## 2022-07-24 ENCOUNTER — Other Ambulatory Visit: Payer: 59

## 2022-07-24 ENCOUNTER — Ambulatory Visit: Payer: 59 | Admitting: Hematology and Oncology

## 2022-07-25 ENCOUNTER — Inpatient Hospital Stay: Payer: Commercial Managed Care - HMO | Admitting: Hematology and Oncology

## 2022-07-25 ENCOUNTER — Inpatient Hospital Stay: Payer: Commercial Managed Care - HMO

## 2022-07-30 ENCOUNTER — Telehealth: Payer: Self-pay | Admitting: *Deleted

## 2022-07-30 ENCOUNTER — Encounter: Payer: Self-pay | Admitting: Physical Therapy

## 2022-07-30 ENCOUNTER — Ambulatory Visit (INDEPENDENT_AMBULATORY_CARE_PROVIDER_SITE_OTHER): Payer: Commercial Managed Care - HMO | Admitting: Physical Therapy

## 2022-07-30 DIAGNOSIS — M25562 Pain in left knee: Secondary | ICD-10-CM | POA: Diagnosis not present

## 2022-07-30 DIAGNOSIS — M25552 Pain in left hip: Secondary | ICD-10-CM

## 2022-07-30 DIAGNOSIS — G8929 Other chronic pain: Secondary | ICD-10-CM

## 2022-07-30 DIAGNOSIS — M25561 Pain in right knee: Secondary | ICD-10-CM

## 2022-07-30 DIAGNOSIS — M25571 Pain in right ankle and joints of right foot: Secondary | ICD-10-CM

## 2022-07-30 DIAGNOSIS — M25551 Pain in right hip: Secondary | ICD-10-CM

## 2022-07-30 DIAGNOSIS — M6281 Muscle weakness (generalized): Secondary | ICD-10-CM

## 2022-07-30 NOTE — Telephone Encounter (Signed)
Pt calling regarding several medications. States she was given 3 BP meds at pharmacy and unsure which she is to take. States pharmacist said the meds have the same components as her previous med. but feels unsafe to take. Also states she did not pick up Glipizide but then reports has been taking and not feeling well. Difficult call. Attempted to review meds, assisted by Interpreter  # [phone]. Pt speaking over myself and interpreter.  Requesting "Expedited appt to see doctor."  Assured pt NT would route to practice for PCPs review and final disposition. After hours call. Please advise: [phone]

## 2022-07-30 NOTE — Therapy (Signed)
OUTPATIENT PHYSICAL THERAPY LOWER EXTREMITY EVALUATION   Patient Name: NIKIRA KUSHNIR MRN: [id number] DOB:1966/04/20, 56 y.o., female Today's Date: 07/30/2022   PT End of Session - 07/30/22 1636     Visit Number 1    Number of Visits 8   up to 16 visits for 1-2x/ week as needed   PT Start Time 3154    PT Stop Time 1600    PT Time Calculation (min) 45 min    Behavior During Therapy [facility] for tasks assessed/performed             Past Medical History:  Diagnosis Date   Arthritis    Breast cancer ([place])    Diabetes mellitus    Family history of lymphoma 01/18/2021   Family history of throat cancer 01/18/2021   Hyperlipidemia    Hypertension    Past Surgical History:  Procedure Laterality Date   BREAST LUMPECTOMY WITH RADIOACTIVE SEED AND SENTINEL LYMPH NODE BIOPSY Right 07/11/2021   Procedure: RIGHT BREAST LUMPECTOMY WITH RADIOACTIVE SEED AND RIGHT SENTINEL LYMPH NODE BIOPSY;  Surgeon: Erroll Luna, MD;  Location: [place];  Service: General;  Laterality: Right;   CESAREAN SECTION     PORT-A-CATH REMOVAL Right 05/22/2022   Procedure: REMOVAL PORT-A-CATH;  Surgeon: Erroll Luna, MD;  Location: [place];  Service: General;  Laterality: Right;   PORTACATH PLACEMENT N/A 01/26/2021   Procedure: INSERTION PORT-A-CATH;  Surgeon: Erroll Luna, MD;  Location: [place];  Service: General;  Laterality: N/A;  [place] Right 07/11/2021   Procedure: RADIOACTIVE SEED GUIDED RIGHT AXILLARY LYMPH NODE BIOPSY;  Surgeon: Erroll Luna, MD;  Location: [place];  Service: General;  Laterality: Right;   Patient Active Problem List   Diagnosis Date Noted   Chest pain 01/30/2022   Arthralgia of shoulder 12/11/2021   Right otitis media 09/26/2021   Port-A-Cath in place 02/07/2021   Genetic testing 02/02/2021   Family history of throat cancer 01/18/2021   Family history of lymphoma 01/18/2021    Malignant neoplasm of lower-inner quadrant of right breast of female, estrogen receptor positive ([place]) 01/12/2021   Posterior tibial tendinitis, right leg 01/26/2020   Diabetes ([place]) 03/11/2017   Muscle pain 08/06/2013   Tiredness 08/06/2013   Candida vaginitis 08/19/2012   Dental cavities 08/19/2012   Knee pain, bilateral 04/16/2012   Diabetes mellitus type 2, controlled ([place]) 04/16/2012   Hyperlipidemia 04/16/2012   Palpitations 04/16/2012   Back pain 04/16/2012    PCP: [name], MD  REFERRING PROVIDER: Elba Barman, DO  REFERRING DIAG: [phone] (ICD-10-CM) - Femoral neck stress fracture, initial encounter M25.561,M25.562,G89.29 (ICD-10-CM) - Chronic pain of both knees  THERAPY DIAG:  Right hip pain  Pain in left hip  Chronic pain of right knee  Chronic pain of left knee  Muscle weakness (generalized)  Pain in right ankle and joints of right foot  Rationale for Evaluation and Treatment Rehabilitation  ONSET DATE: Ongoing for the last 6 years  SUBJECTIVE:   SUBJECTIVE STATEMENT: Pt arriving reporting pain in bilateral hips and knees. Pt also reporting pain in Rt foot. Pt stating this pain has been ongoing for about 6 years.   PERTINENT HISTORY: -Bilateral femoral neck stress fractures  - h/o Breast Cancer c lymph node removal  PAIN:  NPRS scale: 8/10 Pain location: bil knees, bil hips, Rt foot, Rt low back Pain description: achy, throbbing, sharp Aggravating factors: standing Relieving factors:   PRECAUTIONS:  None  WEIGHT BEARING RESTRICTIONS No  FALLS:  Has patient fallen in last 6 months? No  LIVING ENVIRONMENT: Lives with: lives with their family and lives with their spouse Lives in: House/apartment  Has following equipment at home: None  OCCUPATION: standing on several hours each day  PLOF: Independent  PATIENT GOALS Get stronger, stop hurting   OBJECTIVE:   DIAGNOSTIC FINDINGS:  IMPRESSION: Chronic stress fractures of the  medial basilar femoral necks bilaterally. No associated edema to suggest an acute component. No evidence of focal bone lesion/osseous metastasis.   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   Tendinosis and peritonitis of the right distal gluteus minimus and medius tendons. No acute gluteal tendon tear.   Mild tendinosis and peritendinitis of the right proximal hamstrings.  PATIENT SURVEYS:  07/30/22: FOTO intake: 41  predicted:  47  COGNITION:  Overall cognitive status: WFL    SENSATION: 07/30/22: WFL    MUSCLE LENGTH: 07/30/22 Hamstrings: Right  58 deg; Left 75 deg   POSTURE:  07/30/22: rounded shoulders and forward head  PALPATION: 07/30/22: TTP, Rt hamstring origin and muscle belly, Rt lateral knee joint line, Left lateral knee joint   LOWER EXTREMITY ROM:  Active ROM Right 07/30/22 Left 07/30/22  Hip flexion 90 c pain 102  Hip extension 10 15  Hip abduction 20 25  Hip adduction    Hip internal rotation    Hip external rotation    Knee flexion 125 134  Knee extension 0 0  Ankle dorsiflexion    Ankle plantarflexion    Ankle inversion    Ankle eversion     (Blank rows = not tested)  LOWER EXTREMITY MMT:  MMT Right 07/30/22 Left 07/30/22  Hip flexion 3/5 3+/5  Hip extension    Hip abduction 3/5 3/5  Hip adduction 3/5 3/5  Hip internal rotation    Hip external rotation    Knee flexion 3/5 4/5  Knee extension 3/5 4/5  Ankle dorsiflexion 3/5 4/5  Ankle plantarflexion 3/5 4/5  Ankle inversion 2/5 4/5  Ankle eversion 2/5 4/5   (Blank rows = not tested)    FUNCTIONAL TESTS:  07/30/22:  5 times sit to stand 22 seconds with UE support  GAIT: Distance walked: 50 feet  Assistive device utilized: None Level of assistance: Complete Independence Comments: pt with step through gait pattern with wide BOS   TODAY'S TREATMENT: 07/30/22:  Therex:    HEP instruction/performance c cues for techniques, handout  provided.  Trial set performed of each for comprehension and symptom assessment.  See below for exercise list    PATIENT EDUCATION:  Education details: HEP, POC Person educated: Patient Education method: Explanation, Demonstration, Verbal cues, and Handouts Education comprehension: verbalized understanding, returned demonstration, and verbal cues required    HOME EXERCISE PROGRAM: Access Code: [email or web address] URL: [email or web address] Date: 07/30/2022 Prepared by: Kearney Hard  Exercises - Supine Quad Set  - 2 x daily - 7 x weekly - 10 reps - 5 seconds hold - Seated Knee Flexion Slide  - 2 x daily - 7 x weekly - 20 reps - Seated Hamstring Stretch  - 2 x daily - 7 x weekly - 3 reps - 30 seconds hold  ASSESSMENT:  CLINICAL IMPRESSION: Patient is a 56 y.o. who comes to clinic with complaints of bilateral knee pain, bilateral hip pain and Rt foot pain with mobility, strength and movement coordination deficits that impair their ability to perform usual daily and recreational functional activities  without increase difficulty/symptoms at this time. Pt was issued a HEP with limited tolerance due to pain. Further assessment may need to be performed as pt progresses with therapy.  Patient to benefit from skilled PT services to address impairments and limitations to improve to previous level of function without restriction secondary to condition.     OBJECTIVE IMPAIRMENTS decreased mobility, difficulty walking, decreased ROM, decreased strength, increased edema, increased muscle spasms, impaired flexibility, and pain.   ACTIVITY LIMITATIONS bending, sitting, standing, squatting, sleeping, and stairs  PARTICIPATION LIMITATIONS: community activity and occupation  PERSONAL FACTORS 3+ comorbidities: breast CA, HTN, DM  are also affecting patient's functional outcome.   REHAB POTENTIAL: fair to good, limited tolerance to gentle ROM exercises this visit  CLINICAL DECISION MAKING:  Evolving/moderate complexity  EVALUATION COMPLEXITY: Moderate   GOALS: Goals reviewed with patient? Yes  Short term PT Goals (target date for Short term goals are 3 weeks 08/24/22) Patient will demonstrate independent use of home exercise program to maintain progress from in clinic treatments. Goal status: New   Long term PT goals (target dates for all long term goals are 10 weeks  09/28/22 )   1. Patient will demonstrate/report pain at worst less than or equal to 2/10 to facilitate minimal limitation in daily activities including job related functions secondary to pain symptoms. Goal status: New   2. Patient will demonstrate independent use of home exercise program to facilitate ability to maintain/progress functional gains from skilled physical therapy services. Goal status: New   3. Patient will demonstrate FOTO outcome > or = 47 % to indicate reduced disability due to condition. Goal status: New   4.  Patient will demonstrate  LE MMT 4/5 throughout to faciltiate usual transfers, stairs, squatting at [facility] for daily life.   Goal status: New   5.  Pt will improve her 5 time sit to stand to </= 14 seconds with no UE support.    Goal status: New  6. Pt will be able to bend over and pick up 5 # object from floor and place on counter with pain </= 2/10 in bilateral LE's/   A. Goal Status:             PLAN:  PT FREQUENCY: 1-2x/week  PT DURATION: 8 weeks  PLANNED INTERVENTIONS: Therapeutic exercises, Therapeutic activity, Neuro Muscular re-education, Balance training, Gait training, Patient/Family education, Joint mobilization, Stair training, DME instructions, Dry Needling, Electrical stimulation, Traction, Cryotherapy, Moist heat, Taping, Ultrasound, Ionotophoresis '4mg'$ /ml Dexamethasone, and Manual therapy.  All included unless contraindicated   PLAN FOR NEXT SESSION: Review HEP knowledge/results. LE ROM and strengthening to pt's tolerance           Oretha Caprice, PT, MPT 07/30/2022, 4:40 PM

## 2022-07-31 NOTE — Telephone Encounter (Signed)
Patient at work. Unable to recall BP readings she had taken. States all 3 medications are making her BP too low.   Will call office at 3:30 when she gets off work.

## 2022-08-02 ENCOUNTER — Other Ambulatory Visit: Payer: Self-pay | Admitting: Family Medicine

## 2022-08-02 DIAGNOSIS — E119 Type 2 diabetes mellitus without complications: Secondary | ICD-10-CM

## 2022-08-02 MED ORDER — GLIPIZIDE 10 MG PO TABS: 10.0000 mg | ORAL_TABLET | Freq: Every day | ORAL | 1 refills | Status: DC

## 2022-08-03 ENCOUNTER — Other Ambulatory Visit: Payer: Self-pay

## 2022-08-03 DIAGNOSIS — C50311 Malignant neoplasm of lower-inner quadrant of right female breast: Secondary | ICD-10-CM

## 2022-08-06 ENCOUNTER — Inpatient Hospital Stay: Payer: Commercial Managed Care - HMO | Attending: Hematology and Oncology

## 2022-08-06 ENCOUNTER — Other Ambulatory Visit: Payer: Self-pay

## 2022-08-06 ENCOUNTER — Inpatient Hospital Stay (HOSPITAL_BASED_OUTPATIENT_CLINIC_OR_DEPARTMENT_OTHER): Payer: Commercial Managed Care - HMO | Admitting: Adult Health

## 2022-08-06 VITALS — BP 124/75 | HR 77 | Temp 97.9°F | Resp 16 | Ht 63.0 in | Wt 145.3 lb

## 2022-08-06 DIAGNOSIS — C50311 Malignant neoplasm of lower-inner quadrant of right female breast: Secondary | ICD-10-CM | POA: Diagnosis not present

## 2022-08-06 DIAGNOSIS — Z923 Personal history of irradiation: Secondary | ICD-10-CM | POA: Insufficient documentation

## 2022-08-06 DIAGNOSIS — Z170 Estrogen receptor positive status [ER+]: Secondary | ICD-10-CM | POA: Diagnosis not present

## 2022-08-06 DIAGNOSIS — Z9221 Personal history of antineoplastic chemotherapy: Secondary | ICD-10-CM | POA: Diagnosis not present

## 2022-08-06 DIAGNOSIS — Z853 Personal history of malignant neoplasm of breast: Secondary | ICD-10-CM | POA: Diagnosis not present

## 2022-08-06 LAB — CBC WITH DIFFERENTIAL (CANCER CENTER ONLY)
Abs Immature Granulocytes: 0.02 10*3/uL (ref 0.00–0.07)
Basophils Absolute: 0 10*3/uL (ref 0.0–0.1)
Basophils Relative: 1 %
Eosinophils Absolute: 0.3 10*3/uL (ref 0.0–0.5)
Eosinophils Relative: 5 %
HCT: 40.1 % (ref 36.0–46.0)
Hemoglobin: 13 g/dL (ref 12.0–15.0)
Immature Granulocytes: 0 %
Lymphocytes Relative: 33 %
Lymphs Abs: 1.6 10*3/uL (ref 0.7–4.0)
MCH: 26.3 pg (ref 26.0–34.0)
MCHC: 32.4 g/dL (ref 30.0–36.0)
MCV: 81.2 fL (ref 80.0–100.0)
Monocytes Absolute: 0.3 10*3/uL (ref 0.1–1.0)
Monocytes Relative: 7 %
Neutro Abs: 2.5 10*3/uL (ref 1.7–7.7)
Neutrophils Relative %: 54 %
Platelet Count: 237 10*3/uL (ref 150–400)
RBC: 4.94 MIL/uL (ref 3.87–5.11)
RDW: 14 % (ref 11.5–15.5)
WBC Count: 4.7 10*3/uL (ref 4.0–10.5)
nRBC: 0 % (ref 0.0–0.2)

## 2022-08-06 LAB — CMP (CANCER CENTER ONLY)
ALT: 22 U/L (ref 0–44)
AST: 19 U/L (ref 15–41)
Albumin: 4.3 g/dL (ref 3.5–5.0)
Alkaline Phosphatase: 71 U/L (ref 38–126)
Anion gap: 6 (ref 5–15)
BUN: 20 mg/dL (ref 6–20)
CO2: 29 mmol/L (ref 22–32)
Calcium: 9.6 mg/dL (ref 8.9–10.3)
Chloride: 106 mmol/L (ref 98–111)
Creatinine: 0.73 mg/dL (ref 0.44–1.00)
GFR, Estimated: >60 mL/min (ref >60)
Glucose, Bld: 246 mg/dL — ABNORMAL HIGH (ref 70–99)
Potassium: 4.4 mmol/L (ref 3.5–5.1)
Sodium: 141 mmol/L (ref 135–145)
Total Bilirubin: 0.3 mg/dL (ref 0.3–1.2)
Total Protein: 7.4 g/dL (ref 6.5–8.1)

## 2022-08-06 NOTE — Progress Notes (Signed)
[place] [facility] Follow up:    Rebecca Rakes, MD [place] [address] [place] [address]   DIAGNOSIS:  Cancer Staging  Malignant neoplasm of lower-inner quadrant of right breast of female, estrogen receptor positive ([place]) Staging form: Breast, AJCC 8th Edition - Clinical: Stage IIIB (cT2, cN1, cM0, G3, ER-, PR-, HER2-) - Signed by Chauncey Cruel, MD on 01/18/2021 Stage prefix: Initial diagnosis Histologic grading system: 3 grade system   SUMMARY OF ONCOLOGIC HISTORY: Oncology History  Malignant neoplasm of lower-inner quadrant of right breast of female, estrogen receptor positive ([place])  01/06/2021 Initial Diagnosis   [place] woman status post right breast lower inner quadrant biopsy 01/06/2021 for a clinically T2 N1, stage IIIB functionally triple negative invasive ductal carcinoma, grade 3, with and MIB-1 of 90%             (a) chest CT scan 01/29/2021 shows no evidence of metastatic disease             (b) bone scan 02/01/2021 shows no evidence of metastatic disease   01/18/2021 Cancer Staging   Staging form: Breast, AJCC 8th Edition - Clinical: Stage IIIB (cT2, cN1, cM0, G3, ER-, PR-, HER2-) - Signed by Chauncey Cruel, MD on 01/18/2021 Stage prefix: Initial diagnosis Histologic grading system: 3 grade system   01/31/2021 Genetic Testing   PNegative hereditary cancer genetic testing: no pathogenic variants detected in [facility] CancerNext-Expanded + RNAinsight Panel.  The report date is January 31, 2021.  The CancerNext-Expanded gene panel offered by [facility] and includes sequencing, rearrangement, and RNA analysis for the following 77 genes: AIP, ALK, APC, ATM, AXIN2, BAP1, BARD1, BLM, BMPR1A, BRCA1, BRCA2, BRIP1, CDC73, CDH1, CDK4, CDKN1B, CDKN2A, CHEK2, CTNNA1, DICER1, FANCC, FH, FLCN, GALNT12, KIF1B, LZTR1, MAX, MEN1, MET, MLH1, MSH2, MSH3, MSH6, MUTYH, NBN, NF1, NF2, NTHL1, PALB2, PHOX2B, PMS2, POT1, PRKAR1A, PTCH1, PTEN, RAD51C, RAD51D, RB1,  RECQL, RET, SDHA, SDHAF2, SDHB, SDHC, SDHD, SMAD4, SMARCA4, SMARCB1, SMARCE1, STK11, SUFU, TMEM127, TP53, TSC1, TSC2, VHL and XRCC2 (sequencing and deletion/duplication); EGFR, EGLN1, HOXB13, KIT, MITF, PDGFRA, POLD1, and POLE (sequencing only); EPCAM and GREM1 (deletion/duplication only).    02/07/2021 - 05/23/2021 Neo-Adjuvant Chemotherapy    neoadjuvant chemotherapy consisting of pembrolizumab given day 1 together with carboplatin and paclitaxel, with Taxol given on day 8 and 15, and filgrastim on days 16,17 and 18, started 02/07/2021, repeated every 21 days x 2, followed by doxorubicin and cyclophosphamide every 21 days x 4 also given with pembrolizumab on day 1 and PEG filgrastim day 3, started 03/22/2021 and completed 05/23/2021             (a) carboplatin/paclitaxel discontinued after the cycle 2-day 8 dose with neuropathy developing (subsequently resolved)   06/13/2021 - 01/30/2022 Chemotherapy   Patient is on Treatment Plan : HEAD/NECK Pembrolizumab Q21D     07/11/2021 Surgery   right lumpectomy and sentinel lymph node sampling 07/11/2021 showed a complete pathologic response (ypT0 ypN0)             (a) a total of 3 right axillary lymph nodes were removed   08/16/2021 - 09/29/2021 Radiation Therapy   08/16/2021 through 09/29/2021 Site Technique Total Dose (Gy) Dose per Fx (Gy) Completed Fx Beam Energies  Breast, Right: Breast_Rt_IMN 3D 50/50 2 25/25 10X  Breast, Right: Breast_Rt_PAB_SCV 3D 50/50 2 25/25 10X, 15X  Breast, Right: Breast_Rt_Bst 3D 10/10 2 5/5 6X, 10X      CURRENT THERAPY: observation  INTERVAL HISTORY: Rebecca [name] 56 y.o. female  returns for follow up of her history of breast cancer.  She is doing moderately well today.  She has noted some right breast fullness and shoulder ROM difficulty.  She underwent mammogram on 03/15/2022 at [facility] that showed no evidence of malignancy and breast density Category B.  She exercises regularly and sees her PCP regularly.      Patient Active Problem List   Diagnosis Date Noted   Chest pain 01/30/2022   Arthralgia of shoulder 12/11/2021   Right otitis media 09/26/2021   Port-A-Cath in place 02/07/2021   Genetic testing 02/02/2021   Family history of throat cancer 01/18/2021   Family history of lymphoma 01/18/2021   Malignant neoplasm of lower-inner quadrant of right breast of female, estrogen receptor positive ([place]) 01/12/2021   Posterior tibial tendinitis, right leg 01/26/2020   Diabetes ([place]) 03/11/2017   Muscle pain 08/06/2013   Tiredness 08/06/2013   Candida vaginitis 08/19/2012   Dental cavities 08/19/2012   Knee pain, bilateral 04/16/2012   Diabetes mellitus type 2, controlled ([place]) 04/16/2012   Hyperlipidemia 04/16/2012   Palpitations 04/16/2012   Back pain 04/16/2012    is allergic to lisinopril and heparin sod (pork) lock flush.  MEDICAL HISTORY: Past Medical History:  Diagnosis Date   Arthritis    Breast cancer ([place])    Diabetes mellitus    Family history of lymphoma 01/18/2021   Family history of throat cancer 01/18/2021   Hyperlipidemia    Hypertension     SURGICAL HISTORY: Past Surgical History:  Procedure Laterality Date   BREAST LUMPECTOMY WITH RADIOACTIVE SEED AND SENTINEL LYMPH NODE BIOPSY Right 07/11/2021   Procedure: RIGHT BREAST LUMPECTOMY WITH RADIOACTIVE SEED AND RIGHT SENTINEL LYMPH NODE BIOPSY;  Surgeon: Erroll Luna, MD;  Location: [place];  Service: General;  Laterality: Right;   CESAREAN SECTION     PORT-A-CATH REMOVAL Right 05/22/2022   Procedure: REMOVAL PORT-A-CATH;  Surgeon: Erroll Luna, MD;  Location: [place];  Service: General;  Laterality: Right;   PORTACATH PLACEMENT N/A 01/26/2021   Procedure: INSERTION PORT-A-CATH;  Surgeon: Erroll Luna, MD;  Location: [place];  Service: General;  Laterality: N/A;  67   [place] Right 07/11/2021   Procedure: RADIOACTIVE SEED GUIDED RIGHT  AXILLARY LYMPH NODE BIOPSY;  Surgeon: Erroll Luna, MD;  Location: [place];  Service: General;  Laterality: Right;    SOCIAL HISTORY: Social History   Socioeconomic History   Marital status: Divorced    Spouse name: Not on file   Number of children: Not on file   Years of education: Not on file   Highest education level: Not on file  Occupational History   Not on file  Tobacco Use   Smoking status: Never    Passive exposure: Yes   Smokeless tobacco: Never  Vaping Use   Vaping Use: Never used  Substance and Sexual Activity   Alcohol use: No   Drug use: No   Sexual activity: Not Currently  Other Topics Concern   Not on file  Social History Narrative   Not on file   Social Determinants of Health   Financial Resource Strain: Not on file  Food Insecurity: Not on file  Transportation Needs: No Transportation Needs (01/18/2021)   PRAPARE - Transportation    Lack of Transportation (Medical): No    Lack of Transportation (Non-Medical): No  Physical Activity: Not on file  Stress: Not on file  Social Connections: Not on file  Intimate Partner Violence: Not on file    FAMILY HISTORY: Family History  Problem Relation Age of Onset   Diabetes Mother    Heart disease Mother    Diabetes Father    Throat cancer Father        d. 45s   Diabetes Sister    Diabetes Brother    Lymphoma Cousin        maternal cousin; d. 85s    Review of Systems  Constitutional:  Negative for appetite change, chills, fatigue, fever and unexpected weight change.  HENT:   Negative for hearing loss, lump/mass and trouble swallowing.   Eyes:  Negative for eye problems and icterus.  Respiratory:  Negative for chest tightness, cough and shortness of breath.   Cardiovascular:  Negative for chest pain, leg swelling and palpitations.  Gastrointestinal:  Negative for abdominal distention, abdominal pain, constipation, diarrhea, nausea and vomiting.  Endocrine: Negative for hot  flashes.  Genitourinary:  Negative for difficulty urinating.   Musculoskeletal:  Negative for arthralgias.  Skin:  Negative for itching and rash.  Neurological:  Negative for dizziness, extremity weakness, headaches and numbness.  Hematological:  Negative for adenopathy. Does not bruise/bleed easily.  Psychiatric/Behavioral:  Negative for depression. The patient is not nervous/anxious.       PHYSICAL EXAMINATION  ECOG PERFORMANCE STATUS: 1 - Symptomatic but completely ambulatory  Vitals:   08/06/22 1331  BP: 124/75  Pulse: 77  Resp: 16  Temp: 97.9 F (36.6 C)  SpO2: 94%    Physical Exam Constitutional:      General: She is not in acute distress.    Appearance: Normal appearance. She is not toxic-appearing.  HENT:     Head: Normocephalic and atraumatic.  Eyes:     General: No scleral icterus. Cardiovascular:     Rate and Rhythm: Normal rate and regular rhythm.     Pulses: Normal pulses.     Heart sounds: Normal heart sounds.  Pulmonary:     Effort: Pulmonary effort is normal.     Breath sounds: Normal breath sounds.  Chest:     Comments: Right breast s/p lumpectomy and radiation, no sign of local recurrence, slight fullness in ROM, left breast benign Abdominal:     General: Abdomen is flat. Bowel sounds are normal. There is no distension.     Palpations: Abdomen is soft.     Tenderness: There is no abdominal tenderness.  Musculoskeletal:        General: No swelling.     Cervical back: Neck supple.  Lymphadenopathy:     Cervical: No cervical adenopathy.  Skin:    General: Skin is warm and dry.     Findings: No rash.  Neurological:     General: No focal deficit present.     Mental Status: She is alert.  Psychiatric:        Mood and Affect: Mood normal.        Behavior: Behavior normal.     LABORATORY DATA: None for this visit   ASSESSMENT and THERAPY PLAN:   Malignant neoplasm of lower-inner quadrant of right breast of female, estrogen receptor  positive ([place]) Rebecca [name] is a 56 year old woman with stage IIIB triple negative right sided breast cancer diagnosed in 12/2020 s/p neoadjuvant chemotherapy, maintenance Keytruda, right lumpectomy, and adjuvant radiation therapy.  Rebecca [name] has no clinical or radiographic sign of breast cancer recurrence.  She will continue to undergo annual mammography at [facility], next due in 02/2022.    Her breast  fullness and tightness with ROM can happen after surgery.  I placed a referral to PT.   I recommended continued healthy diet, exercise, and f/u with her PCP for preventative health.    We will see Rebecca [name] back in 6 months for f/u.  She will see Dr. Chryl Heck.     All questions were answered. The patient knows to call the clinic with any problems, questions or concerns. We can certainly see the patient much sooner if necessary.  Total encounter time:20 minutes*in face-to-face visit time, chart review, lab review, care coordination, order entry, and documentation of the encounter time.    Wilber Bihari, NP 08/11/22 8:51 AM Medical Oncology and Hematology [facility] [place], [place] [address] Tel. [phone]    Fax. [phone]  *Total Encounter Time as defined by the Centers for Medicare and Medicaid Services includes, in addition to the face-to-face time of a patient visit (documented in the note above) non-face-to-face time: obtaining and reviewing outside history, ordering and reviewing medications, tests or procedures, care coordination (communications with other health care professionals or caregivers) and documentation in the medical record.

## 2022-08-08 ENCOUNTER — Other Ambulatory Visit: Payer: Self-pay | Admitting: Family Medicine

## 2022-08-08 DIAGNOSIS — E119 Type 2 diabetes mellitus without complications: Secondary | ICD-10-CM

## 2022-08-11 ENCOUNTER — Encounter: Payer: Self-pay | Admitting: Adult Health

## 2022-08-11 NOTE — Assessment & Plan Note (Signed)
Rebecca [name] is a 56 year old woman with stage IIIB triple negative right sided breast cancer diagnosed in 12/2020 s/p neoadjuvant chemotherapy, maintenance Keytruda, right lumpectomy, and adjuvant radiation therapy.  Rebecca [name] has no clinical or radiographic sign of breast cancer recurrence.  She will continue to undergo annual mammography at [facility], next due in 02/2022.    Her breast fullness and tightness with ROM can happen after surgery.  I placed a referral to PT.   I recommended continued healthy diet, exercise, and f/u with her PCP for preventative health.    We will see Rebecca [name] back in 6 months for f/u.  She will see Dr. Chryl Heck.

## 2022-08-13 ENCOUNTER — Ambulatory Visit (INDEPENDENT_AMBULATORY_CARE_PROVIDER_SITE_OTHER): Payer: Commercial Managed Care - HMO | Admitting: Physical Therapy

## 2022-08-13 ENCOUNTER — Encounter: Payer: Self-pay | Admitting: Physical Therapy

## 2022-08-13 DIAGNOSIS — M6281 Muscle weakness (generalized): Secondary | ICD-10-CM

## 2022-08-13 DIAGNOSIS — M25551 Pain in right hip: Secondary | ICD-10-CM

## 2022-08-13 DIAGNOSIS — M25561 Pain in right knee: Secondary | ICD-10-CM | POA: Diagnosis not present

## 2022-08-13 DIAGNOSIS — M25562 Pain in left knee: Secondary | ICD-10-CM | POA: Diagnosis not present

## 2022-08-13 DIAGNOSIS — M25552 Pain in left hip: Secondary | ICD-10-CM | POA: Diagnosis not present

## 2022-08-13 DIAGNOSIS — M25571 Pain in right ankle and joints of right foot: Secondary | ICD-10-CM

## 2022-08-13 DIAGNOSIS — G8929 Other chronic pain: Secondary | ICD-10-CM

## 2022-08-13 NOTE — Therapy (Signed)
OUTPATIENT PHYSICAL THERAPY TREATMENT NOTE   Patient Name: Rebecca [name] MRN: [id number] DOB:January 15, 1966, 56 y.o., female Today's Date: 08/13/2022  END OF SESSION:   PT End of Session - 08/13/22 1302     Visit Number 2    Number of Visits 8   up to 16 visits for 1-2x/ week as needed   Date for PT Re-Evaluation 09/28/22    Authorization Type [employer] $15 copay; 30 visit limit    PT Start Time 1300    PT Stop Time 1340    PT Time Calculation (min) 40 min    Behavior During Therapy WFL for tasks assessed/performed             Past Medical History:  Diagnosis Date   Arthritis    Breast cancer ([place])    Diabetes mellitus    Family history of lymphoma 01/18/2021   Family history of throat cancer 01/18/2021   Hyperlipidemia    Hypertension    Past Surgical History:  Procedure Laterality Date   BREAST LUMPECTOMY WITH RADIOACTIVE SEED AND SENTINEL LYMPH NODE BIOPSY Right 07/11/2021   Procedure: RIGHT BREAST LUMPECTOMY WITH RADIOACTIVE SEED AND RIGHT SENTINEL LYMPH NODE BIOPSY;  Surgeon: Erroll Luna, MD;  Location: [place];  Service: General;  Laterality: Right;   CESAREAN SECTION     PORT-A-CATH REMOVAL Right 05/22/2022   Procedure: REMOVAL PORT-A-CATH;  Surgeon: Erroll Luna, MD;  Location: [place];  Service: General;  Laterality: Right;   PORTACATH PLACEMENT N/A 01/26/2021   Procedure: INSERTION PORT-A-CATH;  Surgeon: Erroll Luna, MD;  Location: [place];  Service: General;  Laterality: N/A;  [place] Right 07/11/2021   Procedure: RADIOACTIVE SEED GUIDED RIGHT AXILLARY LYMPH NODE BIOPSY;  Surgeon: Erroll Luna, MD;  Location: [place];  Service: General;  Laterality: Right;   Patient Active Problem List   Diagnosis Date Noted   Chest pain 01/30/2022   Arthralgia of shoulder 12/11/2021   Right otitis media 09/26/2021   Port-A-Cath in place 02/07/2021   Genetic  testing 02/02/2021   Family history of throat cancer 01/18/2021   Family history of lymphoma 01/18/2021   Malignant neoplasm of lower-inner quadrant of right breast of female, estrogen receptor positive ([place]) 01/12/2021   Posterior tibial tendinitis, right leg 01/26/2020   Diabetes ([place]) 03/11/2017   Muscle pain 08/06/2013   Tiredness 08/06/2013   [place] vaginitis 08/19/2012   Dental cavities 08/19/2012   Knee pain, bilateral 04/16/2012   Diabetes mellitus type 2, controlled ([place]) 04/16/2012   Hyperlipidemia 04/16/2012   Palpitations 04/16/2012   Back pain 04/16/2012     THERAPY DIAG:  Right hip pain  Pain in left hip  Chronic pain of right knee  Chronic pain of left knee  Muscle weakness (generalized)  Pain in right ankle and joints of right foot   PCP: [name], MD  REFERRING PROVIDER: Elba Barman, DO  REFERRING DIAG: [phone] (ICD-10-CM) - Femoral neck stress fracture, initial encounter M25.561,M25.562,G89.29 (ICD-10-CM) - Chronic pain of both knees  EVAL THERAPY DIAG:  Right hip pain  Pain in left hip  Chronic pain of right knee  Chronic pain of left knee  Muscle weakness (generalized)  Pain in right ankle and joints of right foot  Rationale for Evaluation and Treatment Rehabilitation  ONSET DATE: Ongoing for the last 6 years  SUBJECTIVE:   SUBJECTIVE STATEMENT: Doing well; still having pain  PERTINENT HISTORY: -Bilateral femoral neck stress  fractures  - h/o Breast Cancer c lymph node removal  PAIN:  NPRS scale: 5/10 Pain location: bil knees, bil hips, Rt foot, Rt low back Pain description: achy, throbbing, sharp Aggravating factors: standing Relieving factors:   PRECAUTIONS: None  WEIGHT BEARING RESTRICTIONS No  FALLS:  Has patient fallen in last 6 months? No  LIVING ENVIRONMENT: Lives with: lives with their family and lives with their spouse Lives in: House/apartment  Has following equipment at home: None  OCCUPATION:  standing on several hours each day  PLOF: Independent  PATIENT GOALS Get stronger, stop hurting   OBJECTIVE:   DIAGNOSTIC FINDINGS:  IMPRESSION: Chronic stress fractures of the medial basilar femoral necks bilaterally. No associated edema to suggest an acute component. No evidence of focal bone lesion/osseous metastasis.   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   Tendinosis and peritonitis of the right distal gluteus minimus and medius tendons. No acute gluteal tendon tear.   Mild tendinosis and peritendinitis of the right proximal hamstrings.  PATIENT SURVEYS:  07/30/22: FOTO intake: 41  predicted:  47  COGNITION: Overall cognitive status: WFL    SENSATION: 07/30/22: WFL    MUSCLE LENGTH: 07/30/22 Hamstrings: Right  58 deg; Left 75 deg   POSTURE:  07/30/22: rounded shoulders and forward head  PALPATION: 07/30/22: TTP, Rt hamstring origin and muscle belly, Rt lateral knee joint line, Left lateral knee joint   LOWER EXTREMITY ROM:  Active ROM Right 07/30/22 Left 07/30/22  Hip flexion 90 c pain 102  Hip extension 10 15  Hip abduction 20 25  Hip adduction    Hip internal rotation    Hip external rotation    Knee flexion 125 134  Knee extension 0 0  Ankle dorsiflexion    Ankle plantarflexion    Ankle inversion    Ankle eversion     (Blank rows = not tested)  LOWER EXTREMITY MMT:  MMT Right 07/30/22 Left 07/30/22  Hip flexion 3/5 3+/5  Hip extension    Hip abduction 3/5 3/5  Hip adduction 3/5 3/5  Hip internal rotation    Hip external rotation    Knee flexion 3/5 4/5  Knee extension 3/5 4/5  Ankle dorsiflexion 3/5 4/5  Ankle plantarflexion 3/5 4/5  Ankle inversion 2/5 4/5  Ankle eversion 2/5 4/5   (Blank rows = not tested)    FUNCTIONAL TESTS:  07/30/22:  5 times sit to stand 22 seconds with UE support  GAIT: Distance walked: 50 feet  Assistive device utilized: None Level of assistance:  Complete Independence Comments: pt with step through gait pattern with wide BOS   TODAY'S TREATMENT: 08/13/22 Therex Sci[employer] bike x 5 min; seat 8; L2.5 Seated hamstring slide x 20 reps bil Seated hamstring stretch 2x30 sec bil Long sitting bil quad set x 20 reps Small range SLR supine x 10 reps bil Sidelying hip abduction x 10 reps bil Supine active hamstring stretch 10 x 5 sec hold Prone hip extension x 10 reps bil Sit to/from stand without UE support x 5 reps  Manual IASTM with percussive device to Lt hamstring in prone  07/30/22:  Therex:    HEP instruction/performance c cues for techniques, handout provided.  Trial set performed of each for comprehension and symptom assessment.  See below for exercise list    PATIENT EDUCATION:  Education details: HEP, POC Person educated: Patient Education method: Explanation, Demonstration, Verbal cues, and Handouts Education comprehension: verbalized understanding, returned demonstration, and verbal cues required  HOME EXERCISE PROGRAM: Access Code: [email or web address] URL: [email or web address] Date: 07/30/2022 Prepared by: Kearney Hard  Exercises - Supine Quad Set  - 2 x daily - 7 x weekly - 10 reps - 5 seconds hold - Seated Knee Flexion Slide  - 2 x daily - 7 x weekly - 20 reps - Seated Hamstring Stretch  - 2 x daily - 7 x weekly - 3 reps - 30 seconds hold  ASSESSMENT:  CLINICAL IMPRESSION: Pt tolerated session well today, but does need mod cues for HEP review.  Will continue to benefit from PT to maximize function.    OBJECTIVE IMPAIRMENTS decreased mobility, difficulty walking, decreased ROM, decreased strength, increased edema, increased muscle spasms, impaired flexibility, and pain.   ACTIVITY LIMITATIONS bending, sitting, standing, squatting, sleeping, and stairs  PARTICIPATION LIMITATIONS: community activity and occupation  PERSONAL FACTORS 3+ comorbidities: breast CA, HTN, DM are also affecting patient's  functional outcome.   REHAB POTENTIAL: fair to good, limited tolerance to gentle ROM exercises this visit  CLINICAL DECISION MAKING: Evolving/moderate complexity  EVALUATION COMPLEXITY: Moderate   GOALS: Goals reviewed with patient? Yes  Short term PT Goals (target date for Short term goals are 3 weeks 08/24/22) Patient will demonstrate independent use of home exercise program to maintain progress from in clinic treatments. Goal status: New   Long term PT goals (target dates for all long term goals are 10 weeks  09/28/22 )   1. Patient will demonstrate/report pain at worst less than or equal to 2/10 to facilitate minimal limitation in daily activities including job related functions secondary to pain symptoms. Goal status: New   2. Patient will demonstrate independent use of home exercise program to facilitate ability to maintain/progress functional gains from skilled physical therapy services. Goal status: New   3. Patient will demonstrate FOTO outcome > or = 47 % to indicate reduced disability due to condition. Goal status: New   4.  Patient will demonstrate  LE MMT 4/5 throughout to faciltiate usual transfers, stairs, squatting at [facility] for daily life.   Goal status: New   5.  Pt will improve her 5 time sit to stand to </= 14 seconds with no UE support.    Goal status: New  6. Pt will be able to bend over and pick up 5 # object from floor and place on counter with pain </= 2/10 in bilateral LE's/   A. Goal Status:             PLAN:  PT FREQUENCY: 1-2x/week  PT DURATION: 8 weeks  PLANNED INTERVENTIONS: Therapeutic exercises, Therapeutic activity, Neuro Muscular re-education, Balance training, Gait training, Patient/Family education, Joint mobilization, Stair training, DME instructions, Dry Needling, Electrical stimulation, Traction, Cryotherapy, Moist heat, Taping, Ultrasound, Ionotophoresis '4mg'$ /ml Dexamethasone, and Manual therapy.  All included unless  contraindicated   PLAN FOR NEXT SESSION: Review/update HEP; LE ROM and strengthening to pt's tolerance     Laureen Abrahams, PT, DPT 08/13/22 1:40 PM

## 2022-08-15 ENCOUNTER — Ambulatory Visit: Payer: Commercial Managed Care - HMO | Admitting: Physician Assistant

## 2022-08-16 ENCOUNTER — Encounter: Payer: Self-pay | Admitting: Physician Assistant

## 2022-08-16 ENCOUNTER — Ambulatory Visit: Payer: Commercial Managed Care - HMO | Attending: Physician Assistant | Admitting: Physician Assistant

## 2022-08-16 VITALS — BP 135/85 | HR 96 | Wt 146.6 lb

## 2022-08-16 DIAGNOSIS — E119 Type 2 diabetes mellitus without complications: Secondary | ICD-10-CM

## 2022-08-16 DIAGNOSIS — E1159 Type 2 diabetes mellitus with other circulatory complications: Secondary | ICD-10-CM | POA: Diagnosis not present

## 2022-08-16 DIAGNOSIS — I152 Hypertension secondary to endocrine disorders: Secondary | ICD-10-CM

## 2022-08-16 LAB — GLUCOSE, POCT (MANUAL RESULT ENTRY): POC Glucose: 143 mg/dl — AB (ref 70–99)

## 2022-08-16 MED ORDER — DAPAGLIFLOZIN PROPANEDIOL 10 MG PO TABS: ORAL_TABLET | ORAL | 1 refills | Status: DC

## 2022-08-16 NOTE — Progress Notes (Signed)
Patient ID: Rebecca [name], female   DOB: 1965-10-25, 56 y.o.   MRN: [id number]   Rebecca [name], is a 56 y.o. female  FHQ:[id number]  QDI:[id number]  DOB - 04-21-1966  No chief complaint on file.      Subjective:   Rebecca [name] is a 56 y.o. female here today for recheck diabetes Fasting blood sugars  111-150. Since medication adjustments/last visit.  She is not taking valsatan or chlorthalidone bc she does not want to be on so many meds  Insurance not wanting to cover [place].  She is requesting we resend prescription for PA with new insurance  Hussein translating   No problems updated.  ALLERGIES: Allergies  Allergen Reactions   Lisinopril Swelling    Swelling in throat, coughing, difficulty swallowing, could not sleep. Occurred Dec 2020.   Heparin Sod Firefighter) Lock Flush Other (See Comments)    Pork derived product    PAST MEDICAL HISTORY: Past Medical History:  Diagnosis Date   Arthritis    Breast cancer ([place])    Diabetes mellitus    Family history of lymphoma 01/18/2021   Family history of throat cancer 01/18/2021   Hyperlipidemia    Hypertension     MEDICATIONS AT HOME: Prior to Admission medications   Medication Sig Start Date End Date Taking? Authorizing Provider  acetaminophen (TYLENOL) 500 MG tablet Take 1 tablet (500 mg total) by mouth 3 (three) times daily as needed (with one aleve tablet). 10/03/21   Gardenia Phlegm, NP  amLODipine (NORVASC) 10 MG tablet Take 1 tablet (10 mg total) by mouth daily. 07/11/22   [name], MD  Ascorbic Acid (VITAMIN C) 1000 MG tablet Take 1 tablet (1,000 mg total) by mouth daily. 03/07/21   Alla Feeling, NP  azelastine (ASTELIN) 0.1 % nasal spray Place 2 sprays into both nostrils 2 (two) times daily. Use in each nostril as directed 10/03/21   Gardenia Phlegm, NP  Blood Glucose Monitoring Suppl (ONETOUCH VERIO REFLECT) w/Device KIT 1 kit by Does not apply route in the morning, at noon, and  at bedtime. Use as directed to test blood sugar up to three times daily.E11.9 02/02/20   [name], MD  cetirizine (ZYRTEC) 10 MG tablet Take 1 tablet (10 mg total) by mouth daily. 11/21/19   Zigmund Gottron, NP  dapagliflozin propanediol (FARXIGA) 10 MG TABS tablet TAKE 1 TABLET(10 MG) BY MOUTH DAILY WITH BREAKFAST 08/16/22   Freeman Caldron M, PA-C  ergocalciferol (DRISDOL) 1.25 MG (50000 UT) capsule Take 1 capsule (50,000 Units total) by mouth once a week. 07/17/22   [name], MD  fluconazole (DIFLUCAN) 100 MG tablet Take 1 tablet (100 mg total) by mouth daily. 09/26/21   Mayers, Cari S, PA-C  fluticasone (FLONASE) 50 MCG/ACT nasal spray Place 2 sprays into both nostrils daily. 10/12/21   Argentina Donovan, PA-C  gabapentin (NEURONTIN) 100 MG capsule Take 1 capsule (100 mg total) by mouth 2 (two) times daily. Take early in the morning and at midday. 03/16/22   Benay Pike, MD  gabapentin (NEURONTIN) 300 MG capsule Take 1 capsule (300 mg total) by mouth at bedtime. 08/21/21   Magrinat, Virgie Dad, MD  glipiZIDE (GLUCOTROL) 10 MG tablet Take 1 tablet (10 mg total) by mouth daily. 08/02/22   [name], MD  glucose blood (ONETOUCH VERIO) test strip Use as directed to test blood sugar up to three times daily. E11.9 02/02/20   [name], MD  labetalol (NORMODYNE) 100 MG tablet Take 100  mg by mouth 2 (two) times daily.    [provider]  Lancets [facility] DELICA PLUS [id number]) MISC 1 Device by Does not apply route in the morning, at noon, and at bedtime. Use as directed to test blood sugar up to three times daily. E11.9 02/02/20   [name], MD  meloxicam (MOBIC) 15 MG tablet Take 1 tablet (15 mg total) by mouth daily. 07/16/22   Elba Barman, DO  metFORMIN (GLUCOPHAGE) 500 MG tablet TAKE 2 TABLETS(1000 MG) BY MOUTH TWICE DAILY 07/11/22   [name], MD  methocarbamol (ROBAXIN) 500 MG tablet Take 1 tablet (500 mg total) by mouth every 8 (eight) hours as needed for  muscle spasms. 10/12/21   Argentina Donovan, PA-C  nystatin (MYCOSTATIN) 100000 UNIT/ML suspension Take 5 mLs (500,000 Units total) by mouth 4 (four) times daily. 04/23/22   Causey, Charlestine Massed, NP  Omega-3 1000 MG CAPS Take 1 capsule (1,000 mg total) by mouth daily. 03/07/21   Alla Feeling, NP  omeprazole (PRILOSEC) 20 MG capsule Take 1 capsule (20 mg total) by mouth daily. 11/21/19   Zigmund Gottron, NP  rosuvastatin (CRESTOR) 20 MG tablet Take 1 tablet (20 mg total) by mouth daily. 07/17/22   [name], MD  vitamin B-12 (CYANOCOBALAMIN) 100 MCG tablet Take 1 tablet (100 mcg total) by mouth daily. 03/07/21   Alla Feeling, NP    ROS: Neg HEENT Neg resp Neg cardiac Neg GI Neg GU Neg MS Neg psych Neg neuro  Objective:   Vitals:   08/16/22 1603  BP: 135/85  Pulse: 96  SpO2: 98%  Weight: 146 lb 9.6 oz (66.5 kg)   Exam General appearance : Awake, alert, not in any distress. Speech Clear. Not toxic looking HEENT: Atraumatic and Normocephalic Chest: Good air entry bilaterally, CTAB.  No rales/rhonchi/wheezing CVS: S1 S2 regular, no murmurs.  Extremities: B/L Lower Ext shows no edema, both legs are warm to touch Neurology: Awake alert, and oriented X 3, CN II-XII intact, Non focal Skin: No Rash  Data Review Lab Results  Component Value Date   HGBA1C 9.5 (A) 07/11/2022   HGBA1C 7.5 (A) 06/29/2021   HGBA1C 7.1 (H) 11/11/2020    Assessment & Plan   1. Type 2 diabetes mellitus without complication, without long-term current use of insulin (HCC) Improving based on fasting readings.  Continue current regimen - Glucose (CBG) - dapagliflozin propanediol (FARXIGA) 10 MG TABS tablet; TAKE 1 TABLET(10 MG) BY MOUTH DAILY WITH BREAKFAST  Dispense: 90 tablet; Refill: 1  2. Hypertension associated with diabetes ([place]) For now can continue just amlodipine and adhere to DASH diet    Return in about 2 months (around 10/16/2022) for Dr Evie Lacks for chronic conditions.  The  patient was given clear instructions to go to ER or return to medical center if symptoms don't improve, worsen or new problems develop. The patient verbalized understanding. The patient was told to call to get lab results if they haven't heard anything in the next week.      Freeman Caldron, PA-C [facility] [place], [place]   08/16/2022, 4:18 PM

## 2022-08-20 ENCOUNTER — Emergency Department (HOSPITAL_COMMUNITY)
Admission: EM | Admit: 2022-08-20 | Discharge: 2022-08-20 | Disposition: A | Discharge status: Home / Self Care | Payer: Commercial Managed Care - HMO | Attending: Emergency Medicine | Admitting: Emergency Medicine

## 2022-08-20 ENCOUNTER — Ambulatory Visit: Payer: Commercial Managed Care - HMO | Admitting: Physical Therapy

## 2022-08-20 ENCOUNTER — Encounter (HOSPITAL_COMMUNITY): Payer: Self-pay | Admitting: Emergency Medicine

## 2022-08-20 ENCOUNTER — Other Ambulatory Visit: Payer: Self-pay

## 2022-08-20 ENCOUNTER — Encounter: Payer: Commercial Managed Care - HMO | Admitting: Physical Therapy

## 2022-08-20 DIAGNOSIS — J069 Acute upper respiratory infection, unspecified: Secondary | ICD-10-CM | POA: Diagnosis not present

## 2022-08-20 DIAGNOSIS — Z853 Personal history of malignant neoplasm of breast: Secondary | ICD-10-CM | POA: Insufficient documentation

## 2022-08-20 DIAGNOSIS — Z20822 Contact with and (suspected) exposure to covid-19: Secondary | ICD-10-CM | POA: Insufficient documentation

## 2022-08-20 DIAGNOSIS — R059 Cough, unspecified: Secondary | ICD-10-CM | POA: Diagnosis present

## 2022-08-20 LAB — CBC WITH DIFFERENTIAL/PLATELET
Abs Immature Granulocytes: 0.04 10*3/uL (ref 0.00–0.07)
Basophils Absolute: 0 10*3/uL (ref 0.0–0.1)
Basophils Relative: 0 %
Eosinophils Absolute: 0.2 10*3/uL (ref 0.0–0.5)
Eosinophils Relative: 2 %
HCT: 39.4 % (ref 36.0–46.0)
Hemoglobin: 13 g/dL (ref 12.0–15.0)
Immature Granulocytes: 0 %
Lymphocytes Relative: 10 %
Lymphs Abs: 0.9 10*3/uL (ref 0.7–4.0)
MCH: 26.7 pg (ref 26.0–34.0)
MCHC: 33 g/dL (ref 30.0–36.0)
MCV: 81.1 fL (ref 80.0–100.0)
Monocytes Absolute: 0.7 10*3/uL (ref 0.1–1.0)
Monocytes Relative: 7 %
Neutro Abs: 7.6 10*3/uL (ref 1.7–7.7)
Neutrophils Relative %: 81 %
Platelets: 242 10*3/uL (ref 150–400)
RBC: 4.86 MIL/uL (ref 3.87–5.11)
RDW: 14.1 % (ref 11.5–15.5)
WBC: 9.4 10*3/uL (ref 4.0–10.5)
nRBC: 0 % (ref 0.0–0.2)

## 2022-08-20 LAB — COMPREHENSIVE METABOLIC PANEL
ALT: 19 U/L (ref 0–44)
AST: 22 U/L (ref 15–41)
Albumin: 3.7 g/dL (ref 3.5–5.0)
Alkaline Phosphatase: 67 U/L (ref 38–126)
Anion gap: 12 (ref 5–15)
BUN: 24 mg/dL — ABNORMAL HIGH (ref 6–20)
CO2: 24 mmol/L (ref 22–32)
Calcium: 9.7 mg/dL (ref 8.9–10.3)
Chloride: 104 mmol/L (ref 98–111)
Creatinine, Ser: 0.68 mg/dL (ref 0.44–1.00)
GFR, Estimated: >60 mL/min (ref >60)
Glucose, Bld: 171 mg/dL — ABNORMAL HIGH (ref 70–99)
Potassium: 4 mmol/L (ref 3.5–5.1)
Sodium: 140 mmol/L (ref 135–145)
Total Bilirubin: 0.6 mg/dL (ref 0.3–1.2)
Total Protein: 7.2 g/dL (ref 6.5–8.1)

## 2022-08-20 LAB — RESP PANEL BY RT-PCR (FLU A&B, COVID) ARPGX2
Influenza A by PCR: NEGATIVE
Influenza B by PCR: NEGATIVE
SARS Coronavirus 2 by RT PCR: NEGATIVE

## 2022-08-20 LAB — GROUP A STREP BY PCR: Group A Strep by PCR: NOT DETECTED

## 2022-08-20 MED ORDER — ACETAMINOPHEN 325 MG PO TABS
650.0000 mg | ORAL_TABLET | Freq: Once | ORAL | Status: AC
  Administered 2022-08-20: 650 mg via ORAL
  Filled 2022-08-20: qty 2

## 2022-08-20 MED ORDER — AMOXICILLIN-POT CLAVULANATE 875-125 MG PO TABS: 1.0000 | ORAL_TABLET | Freq: Two times a day (BID) | ORAL | 0 refills | Status: DC

## 2022-08-20 NOTE — ED Provider Notes (Cosign Needed Addendum)
[facility] EMERGENCY DEPARTMENT Provider Note   CSN: [id number] Arrival date & time: 08/20/22  0033     History  Chief Complaint  Patient presents with   Generalized Body Aches   Fever    Rebecca [name] is a 56 y.o. female.  The history is provided by the patient. The history is limited by a language barrier. A language interpreter was used.  Fever    Patient presents due to fever, myalgias, nasal congestion, sore throat x2 days.  Started acutely on Saturday, her symptoms have been constant.  She has been taking Tylenol and Motrin alternating with minimal improvement.  When she has to have a fever she feels nauseated but denies any vomiting, diarrhea, abdominal pain.  Works as a Scientist, water quality at [employer].  Patient has history of breast cancer, states she is no longer under chemotherapy or radiation.  Intermission currently.  Home Medications Prior to Admission medications   Medication Sig Start Date End Date Taking? Authorizing Provider  amoxicillin-clavulanate (AUGMENTIN) 875-125 MG tablet Take 1 tablet by mouth every 12 (twelve) hours. 08/20/22  Yes Sherrill Raring, PA-C  glipiZIDE (GLUCOTROL) 10 MG tablet Take 1 tablet (10 mg total) by mouth daily. 08/02/22  Yes [name], MD  meloxicam (MOBIC) 15 MG tablet Take 1 tablet (15 mg total) by mouth daily. 07/16/22  Yes Elba Barman, DO  metFORMIN (GLUCOPHAGE) 500 MG tablet TAKE 2 TABLETS(1000 MG) BY MOUTH TWICE DAILY 07/11/22  Yes [name], MD  acetaminophen (TYLENOL) 500 MG tablet Take 1 tablet (500 mg total) by mouth 3 (three) times daily as needed (with one aleve tablet). 10/03/21   Gardenia Phlegm, NP  amLODipine (NORVASC) 10 MG tablet Take 1 tablet (10 mg total) by mouth daily. 07/11/22   [name], MD  Ascorbic Acid (VITAMIN C) 1000 MG tablet Take 1 tablet (1,000 mg total) by mouth daily. 03/07/21   Alla Feeling, NP  azelastine (ASTELIN) 0.1 % nasal spray Place 2 sprays into both  nostrils 2 (two) times daily. Use in each nostril as directed 10/03/21   Gardenia Phlegm, NP  Blood Glucose Monitoring Suppl (ONETOUCH VERIO REFLECT) w/Device KIT 1 kit by Does not apply route in the morning, at noon, and at bedtime. Use as directed to test blood sugar up to three times daily.E11.9 02/02/20   [name], MD  cetirizine (ZYRTEC) 10 MG tablet Take 1 tablet (10 mg total) by mouth daily. 11/21/19   Zigmund Gottron, NP  dapagliflozin propanediol (FARXIGA) 10 MG TABS tablet TAKE 1 TABLET(10 MG) BY MOUTH DAILY WITH BREAKFAST 08/16/22   Freeman Caldron M, PA-C  ergocalciferol (DRISDOL) 1.25 MG (50000 UT) capsule Take 1 capsule (50,000 Units total) by mouth once a week. 07/17/22   [name], MD  fluconazole (DIFLUCAN) 100 MG tablet Take 1 tablet (100 mg total) by mouth daily. 09/26/21   Mayers, Cari S, PA-C  fluticasone (FLONASE) 50 MCG/ACT nasal spray Place 2 sprays into both nostrils daily. 10/12/21   Argentina Donovan, PA-C  gabapentin (NEURONTIN) 100 MG capsule Take 1 capsule (100 mg total) by mouth 2 (two) times daily. Take early in the morning and at midday. 03/16/22   Benay Pike, MD  gabapentin (NEURONTIN) 300 MG capsule Take 1 capsule (300 mg total) by mouth at bedtime. 08/21/21   Magrinat, Virgie Dad, MD  glucose blood (ONETOUCH VERIO) test strip Use as directed to test blood sugar up to three times daily. E11.9 02/02/20   [name], MD  labetalol (NORMODYNE) 100 MG tablet Take 100 mg by mouth 2 (two) times daily.    [provider]  Lancets [facility] DELICA PLUS [id number]) MISC 1 Device by Does not apply route in the morning, at noon, and at bedtime. Use as directed to test blood sugar up to three times daily. E11.9 02/02/20   [name], MD  methocarbamol (ROBAXIN) 500 MG tablet Take 1 tablet (500 mg total) by mouth every 8 (eight) hours as needed for muscle spasms. 10/12/21   Argentina Donovan, PA-C  nystatin (MYCOSTATIN) 100000 UNIT/ML suspension  Take 5 mLs (500,000 Units total) by mouth 4 (four) times daily. 04/23/22   Causey, Charlestine Massed, NP  Omega-3 1000 MG CAPS Take 1 capsule (1,000 mg total) by mouth daily. 03/07/21   Alla Feeling, NP  omeprazole (PRILOSEC) 20 MG capsule Take 1 capsule (20 mg total) by mouth daily. 11/21/19   Zigmund Gottron, NP  rosuvastatin (CRESTOR) 20 MG tablet Take 1 tablet (20 mg total) by mouth daily. 07/17/22   [name], MD  vitamin B-12 (CYANOCOBALAMIN) 100 MCG tablet Take 1 tablet (100 mcg total) by mouth daily. 03/07/21   Alla Feeling, NP      Allergies    Lisinopril and Heparin sod (pork) lock flush    Review of Systems   Review of Systems  Constitutional:  Positive for fever.    Physical Exam Updated Vital Signs BP 112/75 (BP Location: Right Arm)   Pulse 86   Temp 98.7 F (37.1 C) (Oral)   Resp 17   SpO2 99%  Physical Exam Vitals and nursing note reviewed. Exam conducted with a chaperone present.  Constitutional:      Appearance: Normal appearance.  HENT:     Head: Normocephalic and atraumatic.     Nose: Congestion present.     Mouth/Throat:     Pharynx: Posterior oropharyngeal erythema present.  Eyes:     General: No scleral icterus.       Right eye: No discharge.        Left eye: No discharge.     Extraocular Movements: Extraocular movements intact.     Pupils: Pupils are equal, round, and reactive to light.  Cardiovascular:     Rate and Rhythm: Normal rate and regular rhythm.     Pulses: Normal pulses.     Heart sounds: Normal heart sounds. No murmur heard.    No friction rub. No gallop.  Pulmonary:     Effort: Pulmonary effort is normal. No respiratory distress.     Breath sounds: Normal breath sounds.  Abdominal:     General: Abdomen is flat. Bowel sounds are normal. There is no distension.     Palpations: Abdomen is soft.     Tenderness: There is no abdominal tenderness.  Skin:    General: Skin is warm and dry.     Coloration: Skin is not jaundiced.   Neurological:     Mental Status: She is alert. Mental status is at baseline.     Coordination: Coordination normal.     ED Results / Procedures / Treatments   Labs (all labs ordered are listed, but only abnormal results are displayed) Labs Reviewed  COMPREHENSIVE METABOLIC PANEL - Abnormal; Notable for the following components:      Result Value   Glucose, Bld 171 (*)    BUN 24 (*)    All other components within normal limits  RESP PANEL BY RT-PCR (FLU A&B, COVID) [id number]  GROUP A  STREP BY PCR  GROUP A STREP BY PCR  CBC WITH DIFFERENTIAL/PLATELET    EKG None  Radiology No results found.  Procedures Procedures    Medications Ordered in ED Medications  acetaminophen (TYLENOL) tablet 650 mg (650 mg Oral Given 08/20/22 0157)    ED Course/ Medical Decision Making/ A&P                           Medical Decision Making Risk Prescription drug management.   Patient presents due to viral symptoms x2 days.  Differential includes but limited to virus, COVID, flu, strep, sepsis.  On exam patient is well-appearing.  Heart rate is regular, afebrile currently but was given Tylenol during triage. -BP 112/75 (BP Location: Right Arm)   Pulse 86   Temp 98.7 F (37.1 C) (Oral)   Resp 17   SpO2 99%   I reviewed external medical records including her oncology note.  She is currently in remission.  I ordered and viewed laboratory work-up.  CBC without leukocytosis or anemia.  No gross electrolyte derangement or AKI.  Patient is COVID and flu negative.  Strep test was collected but lost after patient was triage.  Will recollect strep test.   I suspect patient likely has a viral URI.  Fever improved after the Tylenol and her symptoms also feel slightly better now that the fever is resolved.  I have encouraged to continue taking the Tylenol Motrin.  Patient will check MyChart with results of her strep test, if positive she will take the Augmentin twice daily for strep coverage.   Strict return precautions were discussed with the patient who verbalized understanding.  Work note provided, patient will follow-up with her PCP and return if things change or worsen.   Final Clinical Impression(s) / ED Diagnoses Final diagnoses:  Upper respiratory tract infection, unspecified type    Rx / DC Orders ED Discharge Orders          Ordered    amoxicillin-clavulanate (AUGMENTIN) 875-125 MG tablet  Every 12 hours        08/20/22 1434              Sherrill Raring, PA-C 08/20/22 1410    Sherrill Raring, PA-C 08/20/22 1437    Dorie Rank, MD 08/21/22 [phone]

## 2022-08-20 NOTE — ED Provider Triage Note (Signed)
Emergency Medicine Provider Triage Evaluation Note  Rebecca [name] , a 56 y.o. female  was evaluated in triage.  Pt complains of fever for the past 1 to 2 days.  Having associated sore throat, headache, generalized body aches, and nausea.  Taking Motrin/Tylenol at home.  Denies congestion, ear pain, cough, dyspnea, chest pain, abdominal pain, vomiting, diarrhea, or dysuria.  Denies sick contacts with similar symptoms.  Review of Systems  Per above  Physical Exam  BP 100/77 (BP Location: Right Arm)   Pulse (!) 126   Temp (!) 100.5 F (38.1 C) (Oral)   Resp 18   SpO2 97%  Gen:   Awake, no distress   Resp:  Normal effort  MSK:   Moves extremities without difficulty  Other:  Tachycardic.   Medical Decision Making  Medically screening exam initiated at 1:38 AM.  Appropriate orders placed.  Rebecca [name] was informed that the remainder of the evaluation will be completed by another provider, this initial triage assessment does not replace that evaluation, and the importance of remaining in the ED until their evaluation is complete.  Fever   Sabin Gibeault, Glynda Jaeger, PA-C 08/20/22 0141

## 2022-08-20 NOTE — ED Triage Notes (Signed)
Pt here for generalized body aches, nasal congestion, sore throat and fever since yesterday. Pt has taken ibuprofen and tylenol w/o relief.

## 2022-08-20 NOTE — ED Notes (Signed)
Pt notified staff that she was walking out to the lobby to look for her cell phone.

## 2022-08-20 NOTE — Discharge Instructions (Addendum)
Continue taking Tylenol and Motrin for the fever at home.  With your primary for reevaluation later this week if you are not feeling better.  Return to the ED if things change or worsen. If your strep test is positive on mychart take the augmentin twice daily.    ??????? ????? ????? ?? ??????. ?? ????? ??????? ??????? ?? ??? ???? ?? ??? ??????? ??? ??? ?? ???? ???????. ???? ??? ??? ??????? ??? ????? ?????? ?? ????. ??? ???? ????? ?????? ????????? ??????? ???? ??????? ??? ???? mychart? ????? ?????? ??????? ????? ??????. aistamara fi tanawul taylinul wamutrin lieilaj alhumaa fi almanzili. mae 'iieadat altaqyim al'asasii fi waqt lahiq min hadha al'usbue 'iidha kunt la tasheur bialtahasuni. airjie 'iilaa qism altawari 'iidha taghayarat al'umur 'aw sa'at. 'iidha kanat natijat aikhtibar albaktirya aleaqdiat ladayk 'iijabiat ealaa mawqie mychart, tanawul aljureat almueazazat maratayn ywmyan.

## 2022-08-20 NOTE — Therapy (Incomplete)
OUTPATIENT PHYSICAL THERAPY TREATMENT NOTE   Patient Name: DESIRAY ORCHARD MRN: [id number] DOB:August 18, 1966, 56 y.o., female Today's Date: 08/20/2022  END OF SESSION:     Past Medical History:  Diagnosis Date   Arthritis    Breast cancer ([place])    Diabetes mellitus    Family history of lymphoma 01/18/2021   Family history of throat cancer 01/18/2021   Hyperlipidemia    Hypertension    Past Surgical History:  Procedure Laterality Date   BREAST LUMPECTOMY WITH RADIOACTIVE SEED AND SENTINEL LYMPH NODE BIOPSY Right 07/11/2021   Procedure: RIGHT BREAST LUMPECTOMY WITH RADIOACTIVE SEED AND RIGHT SENTINEL LYMPH NODE BIOPSY;  Surgeon: Erroll Luna, MD;  Location: [place];  Service: General;  Laterality: Right;   CESAREAN SECTION     PORT-A-CATH REMOVAL Right 05/22/2022   Procedure: REMOVAL PORT-A-CATH;  Surgeon: Erroll Luna, MD;  Location: [place];  Service: General;  Laterality: Right;   PORTACATH PLACEMENT N/A 01/26/2021   Procedure: INSERTION PORT-A-CATH;  Surgeon: Erroll Luna, MD;  Location: [place];  Service: General;  Laterality: N/A;  [place] Right 07/11/2021   Procedure: RADIOACTIVE SEED GUIDED RIGHT AXILLARY LYMPH NODE BIOPSY;  Surgeon: Erroll Luna, MD;  Location: [place];  Service: General;  Laterality: Right;   Patient Active Problem List   Diagnosis Date Noted   Chest pain 01/30/2022   Arthralgia of shoulder 12/11/2021   Right otitis media 09/26/2021   Port-A-Cath in place 02/07/2021   Genetic testing 02/02/2021   Family history of throat cancer 01/18/2021   Family history of lymphoma 01/18/2021   Malignant neoplasm of lower-inner quadrant of right breast of female, estrogen receptor positive ([place]) 01/12/2021   Posterior tibial tendinitis, right leg 01/26/2020   Diabetes ([place]) 03/11/2017   Muscle pain 08/06/2013   Tiredness 08/06/2013   Candida vaginitis  08/19/2012   Dental cavities 08/19/2012   Knee pain, bilateral 04/16/2012   Diabetes mellitus type 2, controlled ([place]) 04/16/2012   Hyperlipidemia 04/16/2012   Palpitations 04/16/2012   Back pain 04/16/2012     THERAPY DIAG:  No diagnosis found.   PCP: [name], MD  REFERRING PROVIDER: Elba Barman, DO  REFERRING DIAG: [id number] (ICD-10-CM) - Femoral neck stress fracture, initial encounter M25.561,M25.562,G89.29 (ICD-10-CM) - Chronic pain of both knees  EVAL THERAPY DIAG:  Right hip pain  Pain in left hip  Chronic pain of right knee  Chronic pain of left knee  Muscle weakness (generalized)  Pain in right ankle and joints of right foot  Rationale for Evaluation and Treatment Rehabilitation  ONSET DATE: Ongoing for the last 6 years  SUBJECTIVE:   SUBJECTIVE STATEMENT: *** Doing well; still having pain  PERTINENT HISTORY: -Bilateral femoral neck stress fractures  - h/o Breast Cancer c lymph node removal  PAIN:  NPRS scale: 5/10 Pain location: bil knees, bil hips, Rt foot, Rt low back Pain description: achy, throbbing, sharp Aggravating factors: standing Relieving factors:   PRECAUTIONS: None  WEIGHT BEARING RESTRICTIONS No  FALLS:  Has patient fallen in last 6 months? No  LIVING ENVIRONMENT: Lives with: lives with their family and lives with their spouse Lives in: House/apartment  Has following equipment at home: None  OCCUPATION: standing on several hours each day  PLOF: Independent  PATIENT GOALS Get stronger, stop hurting   OBJECTIVE:   DIAGNOSTIC FINDINGS:  IMPRESSION: Chronic stress fractures of the medial basilar femoral necks bilaterally. No associated edema to suggest  an acute component. No evidence of focal bone lesion/osseous metastasis.   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   Tendinosis and peritonitis of the right distal gluteus minimus and medius tendons. No acute  gluteal tendon tear.   Mild tendinosis and peritendinitis of the right proximal hamstrings.  PATIENT SURVEYS:  07/30/22: FOTO intake: 41  predicted:  47  COGNITION: Overall cognitive status: WFL    SENSATION: 07/30/22: WFL    MUSCLE LENGTH: 07/30/22 Hamstrings: Right  58 deg; Left 75 deg   POSTURE:  07/30/22: rounded shoulders and forward head  PALPATION: 07/30/22: TTP, Rt hamstring origin and muscle belly, Rt lateral knee joint line, Left lateral knee joint   LOWER EXTREMITY ROM:  Active ROM Right 07/30/22 Left 07/30/22  Hip flexion 90 c pain 102  Hip extension 10 15  Hip abduction 20 25  Hip adduction    Hip internal rotation    Hip external rotation    Knee flexion 125 134  Knee extension 0 0  Ankle dorsiflexion    Ankle plantarflexion    Ankle inversion    Ankle eversion     (Blank rows = not tested)  LOWER EXTREMITY MMT:  MMT Right 07/30/22 Left 07/30/22  Hip flexion 3/5 3+/5  Hip extension    Hip abduction 3/5 3/5  Hip adduction 3/5 3/5  Hip internal rotation    Hip external rotation    Knee flexion 3/5 4/5  Knee extension 3/5 4/5  Ankle dorsiflexion 3/5 4/5  Ankle plantarflexion 3/5 4/5  Ankle inversion 2/5 4/5  Ankle eversion 2/5 4/5   (Blank rows = not tested)    FUNCTIONAL TESTS:  07/30/22:  5 times sit to stand 22 seconds with UE support  GAIT: Distance walked: 50 feet  Assistive device utilized: None Level of assistance: Complete Independence Comments: pt with step through gait pattern with wide BOS   TODAY'S TREATMENT: 08/20/22 ***  08/13/22 Therex SciFit bike x 5 min; seat 8; L2.5 Seated hamstring slide x 20 reps bil Seated hamstring stretch 2x30 sec bil Long sitting bil quad set x 20 reps Small range SLR supine x 10 reps bil Sidelying hip abduction x 10 reps bil Supine active hamstring stretch 10 x 5 sec hold Prone hip extension x 10 reps bil Sit to/from stand without UE support x 5 reps  Manual IASTM with percussive  device to Lt hamstring in prone  07/30/22:  Therex:    HEP instruction/performance c cues for techniques, handout provided.  Trial set performed of each for comprehension and symptom assessment.  See below for exercise list    PATIENT EDUCATION:  Education details: HEP, POC Person educated: Patient Education method: Explanation, Demonstration, Verbal cues, and Handouts Education comprehension: verbalized understanding, returned demonstration, and verbal cues required    HOME EXERCISE PROGRAM: Access Code: [email or web address] URL: [email or web address] Date: 07/30/2022 Prepared by: Kearney Hard  Exercises - Supine Quad Set  - 2 x daily - 7 x weekly - 10 reps - 5 seconds hold - Seated Knee Flexion Slide  - 2 x daily - 7 x weekly - 20 reps - Seated Hamstring Stretch  - 2 x daily - 7 x weekly - 3 reps - 30 seconds hold  ASSESSMENT:  CLINICAL IMPRESSION: *** Pt tolerated session well today, but does need mod cues for HEP review.  Will continue to benefit from PT to maximize function.    OBJECTIVE IMPAIRMENTS decreased mobility, difficulty walking, decreased ROM, decreased strength, increased edema,  increased muscle spasms, impaired flexibility, and pain.   ACTIVITY LIMITATIONS bending, sitting, standing, squatting, sleeping, and stairs  PARTICIPATION LIMITATIONS: community activity and occupation  PERSONAL FACTORS 3+ comorbidities: breast CA, HTN, DM are also affecting patient's functional outcome.   REHAB POTENTIAL: fair to good, limited tolerance to gentle ROM exercises this visit  CLINICAL DECISION MAKING: Evolving/moderate complexity  EVALUATION COMPLEXITY: Moderate   GOALS: Goals reviewed with patient? Yes  Short term PT Goals (target date for Short term goals are 3 weeks 08/24/22) Patient will demonstrate independent use of home exercise program to maintain progress from in clinic treatments. Goal status: New   Long term PT goals (target dates for all long  term goals are 10 weeks  09/28/22 )   1. Patient will demonstrate/report pain at worst less than or equal to 2/10 to facilitate minimal limitation in daily activities including job related functions secondary to pain symptoms. Goal status: New   2. Patient will demonstrate independent use of home exercise program to facilitate ability to maintain/progress functional gains from skilled physical therapy services. Goal status: New   3. Patient will demonstrate FOTO outcome > or = 47 % to indicate reduced disability due to condition. Goal status: New   4.  Patient will demonstrate  LE MMT 4/5 throughout to faciltiate usual transfers, stairs, squatting at [facility] for daily life.   Goal status: New   5.  Pt will improve her 5 time sit to stand to </= 14 seconds with no UE support.    Goal status: New  6. Pt will be able to bend over and pick up 5 # object from floor and place on counter with pain </= 2/10 in bilateral LE's/   A. Goal Status:             PLAN:  PT FREQUENCY: 1-2x/week  PT DURATION: 8 weeks  PLANNED INTERVENTIONS: Therapeutic exercises, Therapeutic activity, Neuro Muscular re-education, Balance training, Gait training, Patient/Family education, Joint mobilization, Stair training, DME instructions, Dry Needling, Electrical stimulation, Traction, Cryotherapy, Moist heat, Taping, Ultrasound, Ionotophoresis '4mg'$ /ml Dexamethasone, and Manual therapy.  All included unless contraindicated   PLAN FOR NEXT SESSION: *** Review/update HEP; LE ROM and strengthening to pt's tolerance     Laureen Abrahams, PT, DPT 08/20/22 7:45 AM

## 2022-08-27 ENCOUNTER — Ambulatory Visit (INDEPENDENT_AMBULATORY_CARE_PROVIDER_SITE_OTHER): Payer: Commercial Managed Care - HMO | Admitting: Physical Therapy

## 2022-08-27 ENCOUNTER — Encounter: Payer: Self-pay | Admitting: Physical Therapy

## 2022-08-27 DIAGNOSIS — M25552 Pain in left hip: Secondary | ICD-10-CM

## 2022-08-27 DIAGNOSIS — M25561 Pain in right knee: Secondary | ICD-10-CM

## 2022-08-27 DIAGNOSIS — M25562 Pain in left knee: Secondary | ICD-10-CM

## 2022-08-27 DIAGNOSIS — M6281 Muscle weakness (generalized): Secondary | ICD-10-CM

## 2022-08-27 DIAGNOSIS — M25551 Pain in right hip: Secondary | ICD-10-CM

## 2022-08-27 DIAGNOSIS — M25571 Pain in right ankle and joints of right foot: Secondary | ICD-10-CM

## 2022-08-27 DIAGNOSIS — G8929 Other chronic pain: Secondary | ICD-10-CM

## 2022-08-27 NOTE — Therapy (Signed)
OUTPATIENT PHYSICAL THERAPY TREATMENT NOTE   Patient Name: Rebecca [name] MRN: [id number] DOB:[birth year], 56 y.o., female Today's Date: 08/27/2022  END OF SESSION:   PT End of Session - 08/27/22 1344     Visit Number 3    Number of Visits 8   up to 16 visits for 1-2x/ week as needed   Date for PT Re-Evaluation 09/28/22    Authorization Type [employer] $15 copay; 30 visit limit    PT Start Time 1344    PT Stop Time 1428    PT Time Calculation (min) 44 min    Behavior During Therapy WFL for tasks assessed/performed              Past Medical History:  Diagnosis Date   Arthritis    Breast cancer ([place])    Diabetes mellitus    Family history of lymphoma 01/18/2021   Family history of throat cancer 01/18/2021   Hyperlipidemia    Hypertension    Past Surgical History:  Procedure Laterality Date   BREAST LUMPECTOMY WITH RADIOACTIVE SEED AND SENTINEL LYMPH NODE BIOPSY Right 07/11/2021   Procedure: RIGHT BREAST LUMPECTOMY WITH RADIOACTIVE SEED AND RIGHT SENTINEL LYMPH NODE BIOPSY;  Surgeon: Erroll Luna, MD;  Location: [place];  Service: General;  Laterality: Right;   CESAREAN SECTION     PORT-A-CATH REMOVAL Right 05/22/2022   Procedure: REMOVAL PORT-A-CATH;  Surgeon: Erroll Luna, MD;  Location: [place];  Service: General;  Laterality: Right;   PORTACATH PLACEMENT N/A 01/26/2021   Procedure: INSERTION PORT-A-CATH;  Surgeon: Erroll Luna, MD;  Location: [place];  Service: General;  Laterality: N/A;  [place] Right 07/11/2021   Procedure: RADIOACTIVE SEED GUIDED RIGHT AXILLARY LYMPH NODE BIOPSY;  Surgeon: Erroll Luna, MD;  Location: [place];  Service: General;  Laterality: Right;   Patient Active Problem List   Diagnosis Date Noted   Chest pain 01/30/2022   Arthralgia of shoulder 12/11/2021   Right otitis media 09/26/2021   Port-A-Cath in place 02/07/2021   Genetic  testing 02/02/2021   Family history of throat cancer 01/18/2021   Family history of lymphoma 01/18/2021   Malignant neoplasm of lower-inner quadrant of right breast of female, estrogen receptor positive ([place]) 01/12/2021   Posterior tibial tendinitis, right leg 01/26/2020   Diabetes ([place]) 03/11/2017   Muscle pain 08/06/2013   Tiredness 08/06/2013   Candida vaginitis 08/19/2012   Dental cavities 08/19/2012   Knee pain, bilateral 04/16/2012   Diabetes mellitus type 2, controlled ([place]) 04/16/2012   Hyperlipidemia 04/16/2012   Palpitations 04/16/2012   Back pain 04/16/2012     THERAPY DIAG:  Right hip pain  Pain in left hip  Chronic pain of right knee  Chronic pain of left knee  Muscle weakness (generalized)  Pain in right ankle and joints of right foot   PCP: [name], MD  REFERRING PROVIDER: Elba Barman, DO  REFERRING DIAG: [phone] (ICD-10-CM) - Femoral neck stress fracture, initial encounter M25.561,M25.562,G89.29 (ICD-10-CM) - Chronic pain of both knees  EVAL THERAPY DIAG:  Right hip pain  Pain in left hip  Chronic pain of right knee  Chronic pain of left knee  Muscle weakness (generalized)  Pain in right ankle and joints of right foot  Rationale for Evaluation and Treatment Rehabilitation  ONSET DATE: Ongoing for the last 6 years  SUBJECTIVE:   SUBJECTIVE STATEMENT: She went to ED with fever. They ruled out Covid, flu &  strep.  She has taken all of meds prescribed.  She took an allergy med last night to help her sleep better.   PERTINENT HISTORY: -Bilateral femoral neck stress fractures  - h/o Breast Cancer c lymph node removal  PAIN:  NPRS scale: today 5/10 & over last week ranging 5/10 - 10/10 Pain location: bil knees, bil hips, Rt foot, Rt low back Pain description: achy, throbbing, sharp Aggravating factors: standing Relieving factors: tylenol,   PRECAUTIONS: None  WEIGHT BEARING RESTRICTIONS No  FALLS:  Has patient fallen in  last 6 months? No  LIVING ENVIRONMENT: Lives with: lives with their family and lives with their spouse Lives in: House/apartment  Has following equipment at home: None  OCCUPATION: standing on several hours each day  PLOF: Independent  PATIENT GOALS Get stronger, stop hurting   OBJECTIVE:   DIAGNOSTIC FINDINGS:  IMPRESSION: Chronic stress fractures of the medial basilar femoral necks bilaterally. No associated edema to suggest an acute component. No evidence of focal bone lesion/osseous metastasis.   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   Tendinosis and peritonitis of the right distal gluteus minimus and medius tendons. No acute gluteal tendon tear.   Mild tendinosis and peritendinitis of the right proximal hamstrings.  PATIENT SURVEYS:  07/30/22: FOTO intake: 41  predicted:  47  COGNITION: Overall cognitive status: WFL    SENSATION: 07/30/22: WFL    MUSCLE LENGTH: 07/30/22 Hamstrings: Right  58 deg; Left 75 deg   POSTURE:  07/30/22: rounded shoulders and forward head  PALPATION: 07/30/22: TTP, Rt hamstring origin and muscle belly, Rt lateral knee joint line, Left lateral knee joint   LOWER EXTREMITY ROM:  Active ROM Right 07/30/22 Left 07/30/22  Hip flexion 90 c pain 102  Hip extension 10 15  Hip abduction 20 25  Hip adduction    Hip internal rotation    Hip external rotation    Knee flexion 125 134  Knee extension 0 0  Ankle dorsiflexion    Ankle plantarflexion    Ankle inversion    Ankle eversion     (Blank rows = not tested)  LOWER EXTREMITY MMT:  MMT Right 07/30/22 Left 07/30/22  Hip flexion 3/5 3+/5  Hip extension    Hip abduction 3/5 3/5  Hip adduction 3/5 3/5  Hip internal rotation    Hip external rotation    Knee flexion 3/5 4/5  Knee extension 3/5 4/5  Ankle dorsiflexion 3/5 4/5  Ankle plantarflexion 3/5 4/5  Ankle inversion 2/5 4/5  Ankle eversion 2/5 4/5   (Blank rows = not  tested)    FUNCTIONAL TESTS:  07/30/22:  5 times sit to stand 22 seconds with UE support  GAIT: Distance walked: 50 feet  Assistive device utilized: None Level of assistance: Complete Independence Comments: pt with step through gait pattern with wide BOS   TODAY'S TREATMENT: 08/27/2022 Therex SciFit bike LEs only  x 8 min; seat 8; level 1 Seated hamstring stretch 2x30 sec bil Seated LAQ BLEs 10 reps bil Seated hamstring curl green theraband controlled eccentric 10 reps ea LE  Standing alternating hip abd 10 reps ea LE 2 sets  Standing alternating hip ext 10 reps ea LE 2 sets  Therapeutic Activities: Sit to/from stand without UE support x 10 reps. PT demo & verbal cues on proper technique.  PT demo & verbal cues on supine <>sit via sidelying for proper mechanics. Pt verbalized & return demo understanding. PT demo & verbal cues on  positioning sidelying with pillows. Pt verbalized understanding.    08/13/22 Therex SciFit bike x 5 min; seat 8; L2.5 Seated hamstring slide x 20 reps bil Seated hamstring stretch 2x30 sec bil Long sitting bil quad set x 20 reps Small range SLR supine x 10 reps bil Sidelying hip abduction x 10 reps bil Supine active hamstring stretch 10 x 5 sec hold Prone hip extension x 10 reps bil Sit to/from stand without UE support x 5 reps  Manual IASTM with percussive device to Lt hamstring in prone  07/30/22:  Therex:    HEP instruction/performance c cues for techniques, handout provided.  Trial set performed of each for comprehension and symptom assessment.  See below for exercise list    PATIENT EDUCATION:  Education details: HEP, POC Person educated: Patient Education method: Explanation, Demonstration, Verbal cues, and Handouts Education comprehension: verbalized understanding, returned demonstration, and verbal cues required    HOME EXERCISE PROGRAM: Access Code: [email or web address] URL: [email or web address] Date:  07/30/2022 Prepared by: Kearney Hard  Exercises - Supine Quad Set  - 2 x daily - 7 x weekly - 10 reps - 5 seconds hold - Seated Knee Flexion Slide  - 2 x daily - 7 x weekly - 20 reps - Seated Hamstring Stretch  - 2 x daily - 7 x weekly - 3 reps - 30 seconds hold  ASSESSMENT:  CLINICAL IMPRESSION: PT instructed in proper sit/stand, supine <>sit and positioning in bed which she appears to understand. Hopefully this will improve her ability to sleep better.  PT instructed in standing hip exercises to work on both balance & strength which she appears to understand.   OBJECTIVE IMPAIRMENTS decreased mobility, difficulty walking, decreased ROM, decreased strength, increased edema, increased muscle spasms, impaired flexibility, and pain.   ACTIVITY LIMITATIONS bending, sitting, standing, squatting, sleeping, and stairs  PARTICIPATION LIMITATIONS: community activity and occupation  PERSONAL FACTORS 3+ comorbidities: breast CA, HTN, DM are also affecting patient's functional outcome.   REHAB POTENTIAL: fair to good, limited tolerance to gentle ROM exercises this visit  CLINICAL DECISION MAKING: Evolving/moderate complexity  EVALUATION COMPLEXITY: Moderate   GOALS: Goals reviewed with patient? Yes  Short term PT Goals (target date for Short term goals are 3 weeks 08/24/22) Patient will demonstrate independent use of home exercise program to maintain progress from in clinic treatments. Goal status: New   Long term PT goals (target dates for all long term goals are 10 weeks  09/28/22 )   1. Patient will demonstrate/report pain at worst less than or equal to 2/10 to facilitate minimal limitation in daily activities including job related functions secondary to pain symptoms. Goal status: New   2. Patient will demonstrate independent use of home exercise program to facilitate ability to maintain/progress functional gains from skilled physical therapy services. Goal status: New   3.  Patient will demonstrate FOTO outcome > or = 47 % to indicate reduced disability due to condition. Goal status: New   4.  Patient will demonstrate  LE MMT 4/5 throughout to faciltiate usual transfers, stairs, squatting at [facility] for daily life.   Goal status: New   5.  Pt will improve her 5 time sit to stand to </= 14 seconds with no UE support.    Goal status: New  6. Pt will be able to bend over and pick up 5 # object from floor and place on counter with pain </= 2/10 in bilateral LE's/   A. Goal Status:  PLAN:  PT FREQUENCY: 1-2x/week  PT DURATION: 8 weeks  PLANNED INTERVENTIONS: Therapeutic exercises, Therapeutic activity, Neuro Muscular re-education, Balance training, Gait training, Patient/Family education, Joint mobilization, Stair training, DME instructions, Dry Needling, Electrical stimulation, Traction, Cryotherapy, Moist heat, Taping, Ultrasound, Ionotophoresis '4mg'$ /ml Dexamethasone, and Manual therapy.  All included unless contraindicated   PLAN FOR NEXT SESSION: check if PT recommendations for bed mobility & positioning helped. continue LE ROM and strengthening to pt's tolerance    Jamey Reas, PT, DPT 08/27/2022, 4:55 PM

## 2022-09-01 ENCOUNTER — Encounter (HOSPITAL_COMMUNITY): Payer: Self-pay

## 2022-09-01 ENCOUNTER — Ambulatory Visit (HOSPITAL_COMMUNITY)
Admission: EM | Admit: 2022-09-01 | Discharge: 2022-09-01 | Disposition: A | Discharge status: Home / Self Care | Payer: Commercial Managed Care - HMO | Attending: Emergency Medicine | Admitting: Emergency Medicine

## 2022-09-01 DIAGNOSIS — M722 Plantar fascial fibromatosis: Secondary | ICD-10-CM | POA: Diagnosis not present

## 2022-09-01 MED ORDER — KETOROLAC TROMETHAMINE 30 MG/ML IJ SOLN
INTRAMUSCULAR | Status: AC
  Filled 2022-09-01: qty 1

## 2022-09-01 MED ORDER — MELOXICAM 15 MG PO TABS: 15.0000 mg | ORAL_TABLET | Freq: Every day | ORAL | 0 refills | Status: DC

## 2022-09-01 MED ORDER — KETOROLAC TROMETHAMINE 30 MG/ML IJ SOLN
30.0000 mg | Freq: Once | INTRAMUSCULAR | Status: AC
  Administered 2022-09-01: 30 mg via INTRAMUSCULAR

## 2022-09-01 NOTE — ED Provider Notes (Signed)
[facility]    CSN: [id number] Arrival date & time: 09/01/22  1546      History   Chief Complaint No chief complaint on file.   HPI Rebecca [name] is a 56 y.o. female.   Patient presents with right foot pain beginning 1 day ago.  Pain has been constant and worsened by standing.  Described as an aching and is primarily over the ball in the arch of the foot.  Has attempted use of ibuprofen which has been ineffective.  Denies numbness, tingling or injury.  Endorses that she stands for 8 hours while at work with minimal breaks to sit.  Has not occurred prior.  Past Medical History:  Diagnosis Date   Arthritis    Breast cancer ([place])    Diabetes mellitus    Family history of lymphoma 01/18/2021   Family history of throat cancer 01/18/2021   Hyperlipidemia    Hypertension     Patient Active Problem List   Diagnosis Date Noted   Chest pain 01/30/2022   Arthralgia of shoulder 12/11/2021   Right otitis media 09/26/2021   Port-A-Cath in place 02/07/2021   Genetic testing 02/02/2021   Family history of throat cancer 01/18/2021   Family history of lymphoma 01/18/2021   Malignant neoplasm of lower-inner quadrant of right breast of female, estrogen receptor positive ([place]) 01/12/2021   Posterior tibial tendinitis, right leg 01/26/2020   Diabetes ([place]) 03/11/2017   Muscle pain 08/06/2013   Tiredness 08/06/2013   Candida vaginitis 08/19/2012   Dental cavities 08/19/2012   Knee pain, bilateral 04/16/2012   Diabetes mellitus type 2, controlled ([place]) 04/16/2012   Hyperlipidemia 04/16/2012   Palpitations 04/16/2012   Back pain 04/16/2012    Past Surgical History:  Procedure Laterality Date   BREAST LUMPECTOMY WITH RADIOACTIVE SEED AND SENTINEL LYMPH NODE BIOPSY Right 07/11/2021   Procedure: RIGHT BREAST LUMPECTOMY WITH RADIOACTIVE SEED AND RIGHT SENTINEL LYMPH NODE BIOPSY;  Surgeon: Erroll Luna, MD;  Location: [place];  Service: General;   Laterality: Right;   CESAREAN SECTION     PORT-A-CATH REMOVAL Right 05/22/2022   Procedure: REMOVAL PORT-A-CATH;  Surgeon: Erroll Luna, MD;  Location: [facility];  Service: General;  Laterality: Right;   PORTACATH PLACEMENT N/A 01/26/2021   Procedure: INSERTION PORT-A-CATH;  Surgeon: Erroll Luna, MD;  Location: [facility];  Service: General;  Laterality: N/A;  60   RADIOACTIVE SEED GUIDED AXILLARY SENTINEL LYMPH NODE Right 07/11/2021   Procedure: RADIOACTIVE SEED GUIDED RIGHT AXILLARY LYMPH NODE BIOPSY;  Surgeon: Erroll Luna, MD;  Location: [facility];  Service: General;  Laterality: Right;    OB History   No obstetric history on file.      Home Medications    Prior to Admission medications   Medication Sig Start Date End Date Taking? Authorizing Provider  acetaminophen (TYLENOL) 500 MG tablet Take 1 tablet (500 mg total) by mouth 3 (three) times daily as needed (with one aleve tablet). 10/03/21   Gardenia Phlegm, NP  amLODipine (NORVASC) 10 MG tablet Take 1 tablet (10 mg total) by mouth daily. 07/11/22   [name], MD  amoxicillin-clavulanate (AUGMENTIN) 875-125 MG tablet Take 1 tablet by mouth every 12 (twelve) hours. 08/20/22   Sherrill Raring, PA-C  Ascorbic Acid (VITAMIN C) 1000 MG tablet Take 1 tablet (1,000 mg total) by mouth daily. 03/07/21   Alla Feeling, NP  azelastine (ASTELIN) 0.1 % nasal spray Place 2 sprays into both nostrils 2 (two)  times daily. Use in each nostril as directed 10/03/21   Gardenia Phlegm, NP  Blood Glucose Monitoring Suppl (ONETOUCH VERIO REFLECT) w/Device KIT 1 kit by Does not apply route in the morning, at noon, and at bedtime. Use as directed to test blood sugar up to three times daily.E11.9 02/02/20   [name], MD  cetirizine (ZYRTEC) 10 MG tablet Take 1 tablet (10 mg total) by mouth daily. 11/21/19   Zigmund Gottron, NP  dapagliflozin propanediol (FARXIGA) 10 MG TABS tablet TAKE 1 TABLET(10 MG)  BY MOUTH DAILY WITH BREAKFAST 08/16/22   Freeman Caldron M, PA-C  ergocalciferol (DRISDOL) 1.25 MG (50000 UT) capsule Take 1 capsule (50,000 Units total) by mouth once a week. 07/17/22   [name], MD  fluconazole (DIFLUCAN) 100 MG tablet Take 1 tablet (100 mg total) by mouth daily. 09/26/21   Mayers, Cari S, PA-C  fluticasone (FLONASE) 50 MCG/ACT nasal spray Place 2 sprays into both nostrils daily. 10/12/21   Argentina Donovan, PA-C  gabapentin (NEURONTIN) 100 MG capsule Take 1 capsule (100 mg total) by mouth 2 (two) times daily. Take early in the morning and at midday. 03/16/22   Benay Pike, MD  gabapentin (NEURONTIN) 300 MG capsule Take 1 capsule (300 mg total) by mouth at bedtime. 08/21/21   Magrinat, Virgie Dad, MD  glipiZIDE (GLUCOTROL) 10 MG tablet Take 1 tablet (10 mg total) by mouth daily. 08/02/22   [name], MD  glucose blood (ONETOUCH VERIO) test strip Use as directed to test blood sugar up to three times daily. E11.9 02/02/20   [name], MD  labetalol (NORMODYNE) 100 MG tablet Take 100 mg by mouth 2 (two) times daily.    [provider]  Lancets [facility] DELICA PLUS [id number]) MISC 1 Device by Does not apply route in the morning, at noon, and at bedtime. Use as directed to test blood sugar up to three times daily. E11.9 02/02/20   [name], MD  meloxicam (MOBIC) 15 MG tablet Take 1 tablet (15 mg total) by mouth daily. 07/16/22   Elba Barman, DO  metFORMIN (GLUCOPHAGE) 500 MG tablet TAKE 2 TABLETS(1000 MG) BY MOUTH TWICE DAILY 07/11/22   [name], MD  methocarbamol (ROBAXIN) 500 MG tablet Take 1 tablet (500 mg total) by mouth every 8 (eight) hours as needed for muscle spasms. 10/12/21   Argentina Donovan, PA-C  nystatin (MYCOSTATIN) 100000 UNIT/ML suspension Take 5 mLs (500,000 Units total) by mouth 4 (four) times daily. 04/23/22   Causey, Charlestine Massed, NP  Omega-3 1000 MG CAPS Take 1 capsule (1,000 mg total) by mouth daily. 03/07/21   Alla Feeling, NP  omeprazole (PRILOSEC) 20 MG capsule Take 1 capsule (20 mg total) by mouth daily. 11/21/19   Zigmund Gottron, NP  rosuvastatin (CRESTOR) 20 MG tablet Take 1 tablet (20 mg total) by mouth daily. 07/17/22   [name], MD  vitamin B-12 (CYANOCOBALAMIN) 100 MCG tablet Take 1 tablet (100 mcg total) by mouth daily. 03/07/21   Alla Feeling, NP    Family History Family History  Problem Relation Age of Onset   Diabetes Mother    Heart disease Mother    Diabetes Father    Throat cancer Father        d. 33s   Diabetes Sister    Diabetes Brother    Lymphoma Cousin        maternal cousin; d. 100s    Social History Social History   Tobacco Use  Smoking status: Never    Passive exposure: Yes   Smokeless tobacco: Never  Vaping Use   Vaping Use: Never used  Substance Use Topics   Alcohol use: No   Drug use: No     Allergies   Lisinopril and Heparin sod (pork) lock flush   Review of Systems Review of Systems Defer to HPI    Physical Exam Triage Vital Signs ED Triage Vitals  Enc Vitals Group     BP 09/01/22 1643 135/88     Pulse Rate 09/01/22 1643 82     Resp 09/01/22 1643 16     Temp 09/01/22 1643 98 F (36.7 C)     Temp Source 09/01/22 1643 Oral     SpO2 09/01/22 1643 96 %     Weight --      Height --      Head Circumference --      Peak Flow --      Pain Score 09/01/22 1638 6     Pain Loc --      Pain Edu? --      Excl. in [place]? --    No data found.  Updated Vital Signs BP 135/88 (BP Location: Right Arm)   Pulse 82   Temp 98 F (36.7 C) (Oral)   Resp 16   LMP  (LMP Unknown)   SpO2 96%   Visual Acuity Right Eye Distance:   Left Eye Distance:   Bilateral Distance:    Right Eye Near:   Left Eye Near:    Bilateral Near:     Physical Exam Constitutional:      Appearance: Normal appearance.  HENT:     Head: Normocephalic.  Pulmonary:     Effort: Pulmonary effort is normal.  Feet:     Comments: Tenderness is present to the midfoot and  the ball of foot along the plantar aspect, no swelling noted, range of motion of the foot/ankle is intact, 2+ pedal pulse, sensation intact, callus present along the medial aspect of the foot adjacent to the ball and along the medial aspect of the left great toe, able to bear weight Neurological:     Mental Status: She is alert and oriented to person, place, and time. Mental status is at baseline.  Psychiatric:        Mood and Affect: Mood normal.        Behavior: Behavior normal.      UC Treatments / Results  Labs (all labs ordered are listed, but only abnormal results are displayed) Labs Reviewed - No data to display  EKG   Radiology No results found.  Procedures Procedures (including critical care time)  Medications Ordered in UC Medications - No data to display  Initial Impression / Assessment and Plan / UC Course  I have reviewed the triage vital signs and the nursing notes.  Pertinent labs & imaging results that were available during my care of the patient were reviewed by me and considered in my medical decision making (see chart for details).  Plantar fasciitis of right foot  Symptomology and presentation is consistent from both diagnoses, low suspicion of bone involvement due to lack of injury , imaging has been deferred, discussed this with patient, Toradol injection given in office and prescribed meloxicam for outpatient use, recommended RICE, heat, massage, stretching and activity as tolerated for additional supportive care, advised follow-up if symptoms persist or worsen Final Clinical Impressions(s) / UC Diagnoses   Final diagnoses:  None  Discharge Instructions   None    ED Prescriptions   None    PDMP not reviewed this encounter.   Hans Eden, NP 09/01/22 1825

## 2022-09-01 NOTE — Discharge Instructions (Addendum)
Today we are treating for your foot pain, which I believe is plantar fasciitis, inside your packet is more information on this condition  You have been given an injection of Toradol today in the office to help reduce inflammation that occurs with this type of injury, ideally your pain will begin to lessen in the next 30 minutes  Starting tomorrow take meloxicam every morning with food for 5 days then you may use medication as needed to continue the above process, you may take Tylenol in addition to this  May apply ice or heat over the affected area in 10 to 15-minute intervals  May massage and stretch area as tolerated  May follow-up with urgent care if symptoms persist or worsen

## 2022-09-01 NOTE — ED Triage Notes (Signed)
Pt is here for right foot pain x 1day

## 2022-09-03 ENCOUNTER — Ambulatory Visit: Payer: Commercial Managed Care - HMO | Admitting: Physical Therapy

## 2022-09-03 ENCOUNTER — Encounter: Payer: Self-pay | Admitting: Physical Therapy

## 2022-09-03 ENCOUNTER — Ambulatory Visit (INDEPENDENT_AMBULATORY_CARE_PROVIDER_SITE_OTHER): Payer: Commercial Managed Care - HMO | Admitting: Physical Therapy

## 2022-09-03 DIAGNOSIS — M6281 Muscle weakness (generalized): Secondary | ICD-10-CM

## 2022-09-03 DIAGNOSIS — M25551 Pain in right hip: Secondary | ICD-10-CM | POA: Diagnosis not present

## 2022-09-03 DIAGNOSIS — M25561 Pain in right knee: Secondary | ICD-10-CM | POA: Diagnosis not present

## 2022-09-03 DIAGNOSIS — M25562 Pain in left knee: Secondary | ICD-10-CM | POA: Diagnosis not present

## 2022-09-03 DIAGNOSIS — M25552 Pain in left hip: Secondary | ICD-10-CM

## 2022-09-03 DIAGNOSIS — G8929 Other chronic pain: Secondary | ICD-10-CM

## 2022-09-03 NOTE — Therapy (Incomplete)
OUTPATIENT PHYSICAL THERAPY ONCOLOGY EVALUATION  Patient Name: Rebecca [name] MRN: [id number] DOB:05/09/1966, 56 y.o., female Today's Date: 09/03/2022    Past Medical History:  Diagnosis Date   Arthritis    Breast cancer ([place])    Diabetes mellitus    Family history of lymphoma 01/18/2021   Family history of throat cancer 01/18/2021   Hyperlipidemia    Hypertension    Past Surgical History:  Procedure Laterality Date   BREAST LUMPECTOMY WITH RADIOACTIVE SEED AND SENTINEL LYMPH NODE BIOPSY Right 07/11/2021   Procedure: RIGHT BREAST LUMPECTOMY WITH RADIOACTIVE SEED AND RIGHT SENTINEL LYMPH NODE BIOPSY;  Surgeon: Erroll Luna, MD;  Location: [facility];  Service: General;  Laterality: Right;   CESAREAN SECTION     PORT-A-CATH REMOVAL Right 05/22/2022   Procedure: REMOVAL PORT-A-CATH;  Surgeon: Erroll Luna, MD;  Location: [facility];  Service: General;  Laterality: Right;   PORTACATH PLACEMENT N/A 01/26/2021   Procedure: INSERTION PORT-A-CATH;  Surgeon: Erroll Luna, MD;  Location: [facility];  Service: General;  Laterality: N/A;  [place] Right 07/11/2021   Procedure: RADIOACTIVE SEED GUIDED RIGHT AXILLARY LYMPH NODE BIOPSY;  Surgeon: Erroll Luna, MD;  Location: [place];  Service: General;  Laterality: Right;   Patient Active Problem List   Diagnosis Date Noted   Chest pain 01/30/2022   Arthralgia of shoulder 12/11/2021   Right otitis media 09/26/2021   Port-A-Cath in place 02/07/2021   Genetic testing 02/02/2021   Family history of throat cancer 01/18/2021   Family history of lymphoma 01/18/2021   Malignant neoplasm of lower-inner quadrant of right breast of female, estrogen receptor positive ([place]) 01/12/2021   Posterior tibial tendinitis, right leg 01/26/2020   Diabetes ([place]) 03/11/2017   Muscle pain 08/06/2013   Tiredness 08/06/2013   Candida vaginitis 08/19/2012    Dental cavities 08/19/2012   Knee pain, bilateral 04/16/2012   Diabetes mellitus type 2, controlled ([place]) 04/16/2012   Hyperlipidemia 04/16/2012   Palpitations 04/16/2012   Back pain 04/16/2012    PCP: [name], MD  REFERRING PROVIDER: Gardenia Phlegm, NP   REFERRING DIAG: C50.311,Z17.0 (ICD-10-CM) - Malignant neoplasm of lower-inner quadrant of right breast of female, estrogen receptor positive ([place])   THERAPY DIAG:  No diagnosis found.  ONSET DATE: ***  Rationale for Evaluation and Treatment: Rehabilitation  SUBJECTIVE:                                                                                                                                                                                           SUBJECTIVE STATEMENT:  PERTINENT HISTORY:  Patient was diagnosed on 01/03/2021 with right grade III invasive ductal carcinoma breast cancer. There are 2 areas that measure 1 cm and 1.1 cm and are located in the lower inner quadrant. It is weakly ER positive (10%), PR negative and HER2 negative with a Ki67 of 90%. She has a positive axillary lymph node. Underwent R lumpectomy and SLNB on 07/11/21 (3 nodes), has completed radiation in 09/2021 still receving infusions every 3 weeks  PAIN:  Are you having pain? {yes/no:20286} NPRS scale: ***/10 Pain location: *** Pain orientation: {Pain Orientation:25161}  PAIN TYPE: {type:313116} Pain description: {PAIN DESCRIPTION:21022940}  Aggravating factors: *** Relieving factors: ***  PRECAUTIONS: {Therapy precautions:24002}  WEIGHT BEARING RESTRICTIONS: {Yes ***/No:24003}  FALLS:  Has patient fallen in last 6 months? {fallsyesno:27318}  LIVING ENVIRONMENT: Lives with: {[facility] lives with:25569::"lives with their family"} Lives in: {Lives in:25570} Stairs: {yes/no:20286}; {Stairs:24000} Has following equipment at home: {Assistive devices:23999}  OCCUPATION: ***  LEISURE: ***  HAND DOMINANCE: {RIGHT/LEFT:21944}    PRIOR LEVEL OF FUNCTION: {PLOF:24004}  PATIENT GOALS: ***   OBJECTIVE:  COGNITION: Overall cognitive status: {cognition:24006}   PALPATION: ***  OBSERVATIONS / OTHER ASSESSMENTS: ***  SENSATION: Light touch: {intact/deficits:24005} Stereognosis: {intact/deficits:24005} Hot/Cold: {intact/deficits:24005} Proprioception: {intact/deficits:24005}  POSTURE: ***  UPPER EXTREMITY AROM/PROM:  A/PROM RIGHT   eval   Shoulder extension   Shoulder flexion   Shoulder abduction   Shoulder internal rotation   Shoulder external rotation     (Blank rows = not tested)  A/PROM LEFT   eval  Shoulder extension   Shoulder flexion   Shoulder abduction   Shoulder internal rotation   Shoulder external rotation     (Blank rows = not tested)  CERVICAL AROM: All within normal limits:    Percent limited  Flexion   Extension   Right lateral flexion   Left lateral flexion   Right rotation   Left rotation     UPPER EXTREMITY STRENGTH: ***    LYMPHEDEMA ASSESSMENTS:   SURGERY TYPE/DATE: ***  NUMBER OF LYMPH NODES REMOVED: ***  CHEMOTHERAPY: ***  RADIATION:***  HORMONE TREATMENT: ***  INFECTIONS: ***  LYMPHEDEMA ASSESSMENTS:   LANDMARK RIGHT  eval  10 cm proximal to olecranon process   Olecranon process   10 cm proximal to ulnar styloid process   Just proximal to ulnar styloid process   Across hand at thumb web space   At base of 2nd digit   (Blank rows = not tested)  LANDMARK LEFT  eval  10 cm proximal to olecranon process   Olecranon process   10 cm proximal to ulnar styloid process   Just proximal to ulnar styloid process   Across hand at thumb web space   At base of 2nd digit   (Blank rows = not tested)    FUNCTIONAL TESTS:  {Functional tests:24029}    QUICK DASH SURVEY: ***   TODAY'S TREATMENT:  DATE:  ***  PATIENT EDUCATION:  Education details: *** Person educated: {Person educated:25204} Education method: {Education Method:25205} Education comprehension: {Education Comprehension:25206}  HOME EXERCISE PROGRAM: ***  ASSESSMENT:  CLINICAL IMPRESSION: Patient is a *** y.o. *** who was seen today for physical therapy evaluation and treatment for ***.   OBJECTIVE IMPAIRMENTS: {opptimpairments:25111}.   ACTIVITY LIMITATIONS: {activitylimitations:27494}  PARTICIPATION LIMITATIONS: {participationrestrictions:25113}  PERSONAL FACTORS: {Personal factors:25162} are also affecting patient's functional outcome.   REHAB POTENTIAL: {rehabpotential:25112}  CLINICAL DECISION MAKING: {clinical decision making:25114}  EVALUATION COMPLEXITY: {Evaluation complexity:25115}  GOALS: Goals reviewed with patient? {yes/no:20286}  SHORT TERM GOALS: Target date: {follow up:25551}    *** Baseline: Goal status: {GOALSTATUS:25110}  2.  *** Baseline:  Goal status: {GOALSTATUS:25110}  3.  *** Baseline:  Goal status: {GOALSTATUS:25110}  4.  *** Baseline:  Goal status: {GOALSTATUS:25110}  5.  *** Baseline:  Goal status: {GOALSTATUS:25110}  6.  *** Baseline:  Goal status: {GOALSTATUS:25110}  LONG TERM GOALS: Target date: {follow up:25551}    *** Baseline:  Goal status: {GOALSTATUS:25110}  2.  *** Baseline:  Goal status: {GOALSTATUS:25110}  3.  *** Baseline:  Goal status: {GOALSTATUS:25110}  4.  *** Baseline:  Goal status: {GOALSTATUS:25110}  5.  *** Baseline:  Goal status: {GOALSTATUS:25110}  6.  *** Baseline:  Goal status: {GOALSTATUS:25110}  PLAN:  PT FREQUENCY: {rehab frequency:25116}  PT DURATION: {rehab duration:25117}  PLANNED INTERVENTIONS: {rehab planned interventions:25118::"Therapeutic exercises","Therapeutic activity","Neuromuscular re-education","Balance training","Gait training","Patient/Family education","Self Care","Joint mobilization"}  PLAN  FOR NEXT SESSION: ***   Allyson Sabal Blue, PT 09/03/2022, 9:06 AM

## 2022-09-03 NOTE — Therapy (Signed)
OUTPATIENT PHYSICAL THERAPY TREATMENT NOTE   Patient Name: COURTENY EGLER MRN: [id number] DOB:1966-10-05, 56 y.o., female Today's Date: 09/03/2022  END OF SESSION:   PT End of Session - 09/03/22 1338     Visit Number 4    Number of Visits 8    Date for PT Re-Evaluation 09/28/22    Authorization Type [employer] $15 copay; 30 visit limit    PT Start Time 1340    PT Stop Time 1420    PT Time Calculation (min) 40 min    Activity Tolerance Patient tolerated treatment well    Behavior During Therapy WFL for tasks assessed/performed               Past Medical History:  Diagnosis Date   Arthritis    Breast cancer ([place])    Diabetes mellitus    Family history of lymphoma 01/18/2021   Family history of throat cancer 01/18/2021   Hyperlipidemia    Hypertension    Past Surgical History:  Procedure Laterality Date   BREAST LUMPECTOMY WITH RADIOACTIVE SEED AND SENTINEL LYMPH NODE BIOPSY Right 07/11/2021   Procedure: RIGHT BREAST LUMPECTOMY WITH RADIOACTIVE SEED AND RIGHT SENTINEL LYMPH NODE BIOPSY;  Surgeon: Erroll Luna, MD;  Location: [place];  Service: General;  Laterality: Right;   CESAREAN SECTION     PORT-A-CATH REMOVAL Right 05/22/2022   Procedure: REMOVAL PORT-A-CATH;  Surgeon: Erroll Luna, MD;  Location: [facility];  Service: General;  Laterality: Right;   PORTACATH PLACEMENT N/A 01/26/2021   Procedure: INSERTION PORT-A-CATH;  Surgeon: Erroll Luna, MD;  Location: [place];  Service: General;  Laterality: N/A;  [place] Right 07/11/2021   Procedure: RADIOACTIVE SEED GUIDED RIGHT AXILLARY LYMPH NODE BIOPSY;  Surgeon: Erroll Luna, MD;  Location: [place];  Service: General;  Laterality: Right;   Patient Active Problem List   Diagnosis Date Noted   Chest pain 01/30/2022   Arthralgia of shoulder 12/11/2021   Right otitis media 09/26/2021   Port-A-Cath in place  02/07/2021   Genetic testing 02/02/2021   Family history of throat cancer 01/18/2021   Family history of lymphoma 01/18/2021   Malignant neoplasm of lower-inner quadrant of right breast of female, estrogen receptor positive ([place]) 01/12/2021   Posterior tibial tendinitis, right leg 01/26/2020   Diabetes ([place]) 03/11/2017   Muscle pain 08/06/2013   Tiredness 08/06/2013   Candida vaginitis 08/19/2012   Dental cavities 08/19/2012   Knee pain, bilateral 04/16/2012   Diabetes mellitus type 2, controlled ([place]) 04/16/2012   Hyperlipidemia 04/16/2012   Palpitations 04/16/2012   Back pain 04/16/2012     THERAPY DIAG:  Right hip pain  Pain in left hip  Chronic pain of right knee  Chronic pain of left knee  Muscle weakness (generalized)   PCP: [name], MD  REFERRING PROVIDER: Elba Barman, DO  REFERRING DIAG: [phone] (ICD-10-CM) - Femoral neck stress fracture, initial encounter M25.561,M25.562,G89.29 (ICD-10-CM) - Chronic pain of both knees  EVAL THERAPY DIAG:  Right hip pain  Pain in left hip  Chronic pain of right knee  Chronic pain of left knee  Muscle weakness (generalized)  Pain in right ankle and joints of right foot  Rationale for Evaluation and Treatment Rehabilitation  ONSET DATE: Ongoing for the last 6 years  SUBJECTIVE:   SUBJECTIVE STATEMENT: Pt arriving stating she has new onset of Rt foot pain of 4-5/10. Pt unable to recall any trauma or event  that may have caused the pain. Pt was seen in Urgent Care on 09/01/22.  Pt stating her new onset in pain may have come from new ankle exercise added at last visit.  Pt stating she received an injection in her Rt hip which feels has helped some. Pt also stating she has been trying to use the bed mobility techniques discussed at last visit.   PERTINENT HISTORY: -Bilateral femoral neck stress fractures  - h/o Breast Cancer c lymph node removal  PAIN:  NPRS scale: today 5/10  Pain location: bil knees, bil  hips, Rt foot, Rt low back Pain description: achy, throbbing, sharp Aggravating factors: standing Relieving factors: tylenol,   PRECAUTIONS: None  WEIGHT BEARING RESTRICTIONS No  FALLS:  Has patient fallen in last 6 months? No  LIVING ENVIRONMENT: Lives with: lives with their family and lives with their spouse Lives in: House/apartment  Has following equipment at home: None  OCCUPATION: standing on several hours each day  PLOF: Independent  PATIENT GOALS Get stronger, stop hurting   OBJECTIVE:   DIAGNOSTIC FINDINGS:  IMPRESSION: Chronic stress fractures of the medial basilar femoral necks bilaterally. No associated edema to suggest an acute component. No evidence of focal bone lesion/osseous metastasis.   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   Tendinosis and peritonitis of the right distal gluteus minimus and medius tendons. No acute gluteal tendon tear.   Mild tendinosis and peritendinitis of the right proximal hamstrings.  PATIENT SURVEYS:  07/30/22: FOTO intake: 41  predicted:  47  COGNITION: Overall cognitive status: WFL    SENSATION: 07/30/22: WFL    MUSCLE LENGTH: 07/30/22 Hamstrings: Right  58 deg; Left 75 deg   POSTURE:  07/30/22: rounded shoulders and forward head  PALPATION: 07/30/22: TTP, Rt hamstring origin and muscle belly, Rt lateral knee joint line, Left lateral knee joint   LOWER EXTREMITY ROM:  Active ROM Right 07/30/22 Left 07/30/22  Hip flexion 90 c pain 102  Hip extension 10 15  Hip abduction 20 25  Hip adduction    Hip internal rotation    Hip external rotation    Knee flexion 125 134  Knee extension 0 0  Ankle dorsiflexion    Ankle plantarflexion    Ankle inversion    Ankle eversion     (Blank rows = not tested)  LOWER EXTREMITY MMT:  MMT Right 07/30/22 Left 07/30/22  Hip flexion 3/5 3+/5  Hip extension    Hip abduction 3/5 3/5  Hip adduction 3/5 3/5  Hip internal  rotation    Hip external rotation    Knee flexion 3/5 4/5  Knee extension 3/5 4/5  Ankle dorsiflexion 3/5 4/5  Ankle plantarflexion 3/5 4/5  Ankle inversion 2/5 4/5  Ankle eversion 2/5 4/5   (Blank rows = not tested)    FUNCTIONAL TESTS:  07/30/22:  5 times sit to stand 22 seconds with UE support  GAIT: Distance walked: 50 feet  Assistive device utilized: None Level of assistance: Complete Independence Comments: pt with step through gait pattern with wide BOS   TODAY'S TREATMENT: 09/03/2022 Therex SciFit bike LEs only  x 8 min; seat 8; level 1 Calf stretch on slant board x 2 holding 30 sec Standing hip abd x 20 each LE c UE support Standing hamstring curs x 15 c UE support  Leg Press: 50# bil LE 2 x 15  Seated hamstring stretch 2x30 sec bil Ankle ABC's     08/27/2022 Therex SciFit bike  LEs only  x 8 min; seat 8; level 1 Seated hamstring stretch 2x30 sec bil Seated LAQ BLEs 10 reps bil Seated hamstring curl green theraband controlled eccentric 10 reps ea LE  Standing alternating hip abd 10 reps ea LE 2 sets  Standing alternating hip ext 10 reps ea LE 2 sets  Therapeutic Activities: Sit to/from stand without UE support x 10 reps. PT demo & verbal cues on proper technique.  PT demo & verbal cues on supine <>sit via sidelying for proper mechanics. Pt verbalized & return demo understanding. PT demo & verbal cues on positioning sidelying with pillows. Pt verbalized understanding.    08/13/22 Therex SciFit bike x 5 min; seat 8; L2.5 Seated hamstring slide x 20 reps bil Seated hamstring stretch 2x30 sec bil Long sitting bil quad set x 20 reps Small range SLR supine x 10 reps bil Sidelying hip abduction x 10 reps bil Supine active hamstring stretch 10 x 5 sec hold Prone hip extension x 10 reps bil Sit to/from stand without UE support x 5 reps  Manual IASTM with percussive device to Lt hamstring in prone  07/30/22:  Therex:    HEP instruction/performance c cues  for techniques, handout provided.  Trial set performed of each for comprehension and symptom assessment.  See below for exercise list    PATIENT EDUCATION:  Education details: HEP, POC Person educated: Patient Education method: Explanation, Demonstration, Verbal cues, and Handouts Education comprehension: verbalized understanding, returned demonstration, and verbal cues required    HOME EXERCISE PROGRAM: Access Code: [email or web address] URL: [email or web address] Date: 07/30/2022 Prepared by: Kearney Hard  Exercises - Supine Quad Set  - 2 x daily - 7 x weekly - 10 reps - 5 seconds hold - Seated Knee Flexion Slide  - 2 x daily - 7 x weekly - 20 reps - Seated Hamstring Stretch  - 2 x daily - 7 x weekly - 3 reps - 30 seconds hold  (Medbridge is down and unable to add to pt's HEP) Ankle ABC's Rolling Plantar fascia with tennis ball, frozen water bottle DF stretch with hamstring stretch  ASSESSMENT:  CLINICAL IMPRESSION: Pt with new onset of Rt foot pain which she was dx in Urgent Care with plantar fascitis. Pt still progressing LE strengthening exercises as well as new exercises issued for her dx of plantar fascitis. Continue skilled PT to progress toward goals set to maximize pt's function.    OBJECTIVE IMPAIRMENTS decreased mobility, difficulty walking, decreased ROM, decreased strength, increased edema, increased muscle spasms, impaired flexibility, and pain.   ACTIVITY LIMITATIONS bending, sitting, standing, squatting, sleeping, and stairs  PARTICIPATION LIMITATIONS: community activity and occupation  PERSONAL FACTORS 3+ comorbidities: breast CA, HTN, DM are also affecting patient's functional outcome.   REHAB POTENTIAL: fair to good, limited tolerance to gentle ROM exercises this visit  CLINICAL DECISION MAKING: Evolving/moderate complexity  EVALUATION COMPLEXITY: Moderate   GOALS: Goals reviewed with patient? Yes  Short term PT Goals (target date for Short  term goals are 3 weeks 08/24/22) Patient will demonstrate independent use of home exercise program to maintain progress from in clinic treatments. Goal status: New   Long term PT goals (target dates for all long term goals are 10 weeks  09/28/22 )   1. Patient will demonstrate/report pain at worst less than or equal to 2/10 to facilitate minimal limitation in daily activities including job related functions secondary to pain symptoms. Goal status: New   2. Patient will demonstrate independent use of home exercise program  to facilitate ability to maintain/progress functional gains from skilled physical therapy services. Goal status: New   3. Patient will demonstrate FOTO outcome > or = 47 % to indicate reduced disability due to condition. Goal status: New   4.  Patient will demonstrate  LE MMT 4/5 throughout to faciltiate usual transfers, stairs, squatting at [facility] for daily life.   Goal status: New   5.  Pt will improve her 5 time sit to stand to </= 14 seconds with no UE support.    Goal status: New  6. Pt will be able to bend over and pick up 5 # object from floor and place on counter with pain </= 2/10 in bilateral LE's/   A. Goal Status:             PLAN:  PT FREQUENCY: 1-2x/week  PT DURATION: 8 weeks  PLANNED INTERVENTIONS: Therapeutic exercises, Therapeutic activity, Neuro Muscular re-education, Balance training, Gait training, Patient/Family education, Joint mobilization, Stair training, DME instructions, Dry Needling, Electrical stimulation, Traction, Cryotherapy, Moist heat, Taping, Ultrasound, Ionotophoresis '4mg'$ /ml Dexamethasone, and Manual therapy.  All included unless contraindicated   PLAN FOR NEXT SESSION: Continue LE ROM and strengthening to pt's tolerance    Oretha Caprice, PT, MPT 09/03/2022, 2:21 PM

## 2022-09-07 NOTE — Therapy (Unsigned)
OUTPATIENT PHYSICAL THERAPY TREATMENT NOTE   Patient Name: Rebecca [name] MRN: [id number] DOB:05-May-1966, 56 y.o., female Today's Date: 09/07/2022  END OF SESSION:       Past Medical History:  Diagnosis Date   Arthritis    Breast cancer ([place])    Diabetes mellitus    Family history of lymphoma 01/18/2021   Family history of throat cancer 01/18/2021   Hyperlipidemia    Hypertension    Past Surgical History:  Procedure Laterality Date   BREAST LUMPECTOMY WITH RADIOACTIVE SEED AND SENTINEL LYMPH NODE BIOPSY Right 07/11/2021   Procedure: RIGHT BREAST LUMPECTOMY WITH RADIOACTIVE SEED AND RIGHT SENTINEL LYMPH NODE BIOPSY;  Surgeon: Erroll Luna, MD;  Location: [place];  Service: General;  Laterality: Right;   CESAREAN SECTION     PORT-A-CATH REMOVAL Right 05/22/2022   Procedure: REMOVAL PORT-A-CATH;  Surgeon: Erroll Luna, MD;  Location: [place];  Service: General;  Laterality: Right;   PORTACATH PLACEMENT N/A 01/26/2021   Procedure: INSERTION PORT-A-CATH;  Surgeon: Erroll Luna, MD;  Location: [place];  Service: General;  Laterality: N/A;  [place] Right 07/11/2021   Procedure: RADIOACTIVE SEED GUIDED RIGHT AXILLARY LYMPH NODE BIOPSY;  Surgeon: Erroll Luna, MD;  Location: [place];  Service: General;  Laterality: Right;   Patient Active Problem List   Diagnosis Date Noted   Chest pain 01/30/2022   Arthralgia of shoulder 12/11/2021   Right otitis media 09/26/2021   Port-A-Cath in place 02/07/2021   Genetic testing 02/02/2021   Family history of throat cancer 01/18/2021   Family history of lymphoma 01/18/2021   Malignant neoplasm of lower-inner quadrant of right breast of female, estrogen receptor positive ([place]) 01/12/2021   Posterior tibial tendinitis, right leg 01/26/2020   Diabetes ([place]) 03/11/2017   Muscle pain 08/06/2013   Tiredness 08/06/2013   Candida  vaginitis 08/19/2012   Dental cavities 08/19/2012   Knee pain, bilateral 04/16/2012   Diabetes mellitus type 2, controlled ([place]) 04/16/2012   Hyperlipidemia 04/16/2012   Palpitations 04/16/2012   Back pain 04/16/2012     THERAPY DIAG:  No diagnosis found.   PCP: [name], MD  REFERRING PROVIDER: Elba Barman, DO  REFERRING DIAG: [phone] (ICD-10-CM) - Femoral neck stress fracture, initial encounter M25.561,M25.562,G89.29 (ICD-10-CM) - Chronic pain of both knees  EVAL THERAPY DIAG:  Right hip pain  Pain in left hip  Chronic pain of right knee  Chronic pain of left knee  Muscle weakness (generalized)  Pain in right ankle and joints of right foot  Rationale for Evaluation and Treatment Rehabilitation  ONSET DATE: Ongoing for the last 6 years  SUBJECTIVE:   SUBJECTIVE STATEMENT: Pt arriving stating she has new onset of Rt foot pain of 4-5/10. Pt unable to recall any trauma or event that may have caused the pain. Pt was seen in Urgent Care on 09/01/22.  Pt stating her new onset in pain may have come from new ankle exercise added at last visit.  Pt stating she received an injection in her Rt hip which feels has helped some. Pt also stating she has been trying to use the bed mobility techniques discussed at last visit.   PERTINENT HISTORY: -Bilateral femoral neck stress fractures  - h/o Breast Cancer c lymph node removal  PAIN:  NPRS scale: today 5/10  Pain location: bil knees, bil hips, Rt foot, Rt low back Pain description: achy, throbbing, sharp Aggravating factors: standing Relieving factors:  tylenol,   PRECAUTIONS: None  WEIGHT BEARING RESTRICTIONS No  FALLS:  Has patient fallen in last 6 months? No  LIVING ENVIRONMENT: Lives with: lives with their family and lives with their spouse Lives in: House/apartment  Has following equipment at home: None  OCCUPATION: standing on several hours each day  PLOF: Independent  PATIENT GOALS Get stronger,  stop hurting   OBJECTIVE:   DIAGNOSTIC FINDINGS:  IMPRESSION: Chronic stress fractures of the medial basilar femoral necks bilaterally. No associated edema to suggest an acute component. No evidence of focal bone lesion/osseous metastasis.   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   Tendinosis and peritonitis of the right distal gluteus minimus and medius tendons. No acute gluteal tendon tear.   Mild tendinosis and peritendinitis of the right proximal hamstrings.  PATIENT SURVEYS:  07/30/22: FOTO intake: 41  predicted:  47  COGNITION: Overall cognitive status: WFL    SENSATION: 07/30/22: WFL    MUSCLE LENGTH: 07/30/22 Hamstrings: Right  58 deg; Left 75 deg   POSTURE:  07/30/22: rounded shoulders and forward head  PALPATION: 07/30/22: TTP, Rt hamstring origin and muscle belly, Rt lateral knee joint line, Left lateral knee joint   LOWER EXTREMITY ROM:  Active ROM Right 07/30/22 Left 07/30/22  Hip flexion 90 c pain 102  Hip extension 10 15  Hip abduction 20 25  Hip adduction    Hip internal rotation    Hip external rotation    Knee flexion 125 134  Knee extension 0 0  Ankle dorsiflexion    Ankle plantarflexion    Ankle inversion    Ankle eversion     (Blank rows = not tested)  LOWER EXTREMITY MMT:  MMT Right 07/30/22 Left 07/30/22  Hip flexion 3/5 3+/5  Hip extension    Hip abduction 3/5 3/5  Hip adduction 3/5 3/5  Hip internal rotation    Hip external rotation    Knee flexion 3/5 4/5  Knee extension 3/5 4/5  Ankle dorsiflexion 3/5 4/5  Ankle plantarflexion 3/5 4/5  Ankle inversion 2/5 4/5  Ankle eversion 2/5 4/5   (Blank rows = not tested)    FUNCTIONAL TESTS:  07/30/22:  5 times sit to stand 22 seconds with UE support  GAIT: Distance walked: 50 feet  Assistive device utilized: None Level of assistance: Complete Independence Comments: pt with step through gait pattern with wide  BOS   TODAY'S TREATMENT: 09/03/2022 Therex Sci[employer] bike LEs only  x 8 min; seat 8; level 1 Calf stretch on slant board x 2 holding 30 sec Standing hip abd x 20 each LE c UE support Standing hamstring curs x 15 c UE support  Leg Press: 50# bil LE 2 x 15  Seated hamstring stretch 2x30 sec bil Ankle ABC's     08/27/2022 Therex SciFit bike LEs only  x 8 min; seat 8; level 1 Seated hamstring stretch 2x30 sec bil Seated LAQ BLEs 10 reps bil Seated hamstring curl green theraband controlled eccentric 10 reps ea LE  Standing alternating hip abd 10 reps ea LE 2 sets  Standing alternating hip ext 10 reps ea LE 2 sets  Therapeutic Activities: Sit to/from stand without UE support x 10 reps. PT demo & verbal cues on proper technique.  PT demo & verbal cues on supine <>sit via sidelying for proper mechanics. Pt verbalized & return demo understanding. PT demo & verbal cues on positioning sidelying with pillows. Pt verbalized understanding.    08/13/22 Therex  SciFit bike x 5 min; seat 8; L2.5 Seated hamstring slide x 20 reps bil Seated hamstring stretch 2x30 sec bil Long sitting bil quad set x 20 reps Small range SLR supine x 10 reps bil Sidelying hip abduction x 10 reps bil Supine active hamstring stretch 10 x 5 sec hold Prone hip extension x 10 reps bil Sit to/from stand without UE support x 5 reps  Manual IASTM with percussive device to Lt hamstring in prone  07/30/22:  Therex:    HEP instruction/performance c cues for techniques, handout provided.  Trial set performed of each for comprehension and symptom assessment.  See below for exercise list    PATIENT EDUCATION:  Education details: HEP, POC Person educated: Patient Education method: Explanation, Demonstration, Verbal cues, and Handouts Education comprehension: verbalized understanding, returned demonstration, and verbal cues required    HOME EXERCISE PROGRAM: Access Code: [email or web address] URL:  [email or web address] Date: 07/30/2022 Prepared by: Kearney Hard  Exercises - Supine Quad Set  - 2 x daily - 7 x weekly - 10 reps - 5 seconds hold - Seated Knee Flexion Slide  - 2 x daily - 7 x weekly - 20 reps - Seated Hamstring Stretch  - 2 x daily - 7 x weekly - 3 reps - 30 seconds hold  (Medbridge is down and unable to add to pt's HEP) Ankle ABC's Rolling Plantar fascia with tennis ball, frozen water bottle DF stretch with hamstring stretch  ASSESSMENT:  CLINICAL IMPRESSION: Pt with new onset of Rt foot pain which she was dx in Urgent Care with plantar fascitis. Pt still progressing LE strengthening exercises as well as new exercises issued for her dx of plantar fascitis. Continue skilled PT to progress toward goals set to maximize pt's function.    OBJECTIVE IMPAIRMENTS decreased mobility, difficulty walking, decreased ROM, decreased strength, increased edema, increased muscle spasms, impaired flexibility, and pain.   ACTIVITY LIMITATIONS bending, sitting, standing, squatting, sleeping, and stairs  PARTICIPATION LIMITATIONS: community activity and occupation  PERSONAL FACTORS 3+ comorbidities: breast CA, HTN, DM are also affecting patient's functional outcome.   REHAB POTENTIAL: fair to good, limited tolerance to gentle ROM exercises this visit  CLINICAL DECISION MAKING: Evolving/moderate complexity  EVALUATION COMPLEXITY: Moderate   GOALS: Goals reviewed with patient? Yes  Short term PT Goals (target date for Short term goals are 3 weeks 08/24/22) Patient will demonstrate independent use of home exercise program to maintain progress from in clinic treatments. Goal status: New   Long term PT goals (target dates for all long term goals are 10 weeks  09/28/22 )   1. Patient will demonstrate/report pain at worst less than or equal to 2/10 to facilitate minimal limitation in daily activities including job related functions secondary to pain symptoms. Goal  status: New   2. Patient will demonstrate independent use of home exercise program to facilitate ability to maintain/progress functional gains from skilled physical therapy services. Goal status: New   3. Patient will demonstrate FOTO outcome > or = 47 % to indicate reduced disability due to condition. Goal status: New   4.  Patient will demonstrate  LE MMT 4/5 throughout to faciltiate usual transfers, stairs, squatting at [facility] for daily life.   Goal status: New   5.  Pt will improve her 5 time sit to stand to </= 14 seconds with no UE support.    Goal status: New  6. Pt will be able to bend over and pick up 5 # object from floor and place on  counter with pain </= 2/10 in bilateral LE's/   A. Goal Status:             PLAN:  PT FREQUENCY: 1-2x/week  PT DURATION: 8 weeks  PLANNED INTERVENTIONS: Therapeutic exercises, Therapeutic activity, Neuro Muscular re-education, Balance training, Gait training, Patient/Family education, Joint mobilization, Stair training, DME instructions, Dry Needling, Electrical stimulation, Traction, Cryotherapy, Moist heat, Taping, Ultrasound, Ionotophoresis '4mg'$ /ml Dexamethasone, and Manual therapy.  All included unless contraindicated   PLAN FOR NEXT SESSION: Continue LE ROM and strengthening to pt's tolerance    Lynden Ang, PT, MPT 09/07/2022, 8:29 AM

## 2022-09-10 ENCOUNTER — Ambulatory Visit (INDEPENDENT_AMBULATORY_CARE_PROVIDER_SITE_OTHER): Payer: Commercial Managed Care - HMO | Admitting: Physical Therapy

## 2022-09-10 ENCOUNTER — Encounter: Payer: Self-pay | Admitting: Physical Therapy

## 2022-09-10 ENCOUNTER — Ambulatory Visit (INDEPENDENT_AMBULATORY_CARE_PROVIDER_SITE_OTHER): Payer: Commercial Managed Care - HMO

## 2022-09-10 ENCOUNTER — Encounter: Payer: Self-pay | Admitting: Sports Medicine

## 2022-09-10 ENCOUNTER — Ambulatory Visit (INDEPENDENT_AMBULATORY_CARE_PROVIDER_SITE_OTHER): Payer: Commercial Managed Care - HMO | Admitting: Sports Medicine

## 2022-09-10 DIAGNOSIS — G8929 Other chronic pain: Secondary | ICD-10-CM

## 2022-09-10 DIAGNOSIS — M25562 Pain in left knee: Secondary | ICD-10-CM

## 2022-09-10 DIAGNOSIS — M6281 Muscle weakness (generalized): Secondary | ICD-10-CM

## 2022-09-10 DIAGNOSIS — M25561 Pain in right knee: Secondary | ICD-10-CM

## 2022-09-10 DIAGNOSIS — M25571 Pain in right ankle and joints of right foot: Secondary | ICD-10-CM

## 2022-09-10 DIAGNOSIS — M21612 Bunion of left foot: Secondary | ICD-10-CM

## 2022-09-10 DIAGNOSIS — M84353S Stress fracture, unspecified femur, sequela: Secondary | ICD-10-CM | POA: Diagnosis not present

## 2022-09-10 DIAGNOSIS — M25551 Pain in right hip: Secondary | ICD-10-CM

## 2022-09-10 DIAGNOSIS — M79672 Pain in left foot: Secondary | ICD-10-CM

## 2022-09-10 DIAGNOSIS — M25552 Pain in left hip: Secondary | ICD-10-CM

## 2022-09-10 DIAGNOSIS — M79671 Pain in right foot: Secondary | ICD-10-CM

## 2022-09-10 MED ORDER — MELOXICAM 15 MG PO TABS: 15.0000 mg | ORAL_TABLET | Freq: Every day | ORAL | 0 refills | Status: DC

## 2022-09-10 MED ORDER — MELOXICAM 7.5 MG PO TABS: 7.5000 mg | ORAL_TABLET | Freq: Two times a day (BID) | ORAL | 0 refills | Status: AC

## 2022-09-10 NOTE — Progress Notes (Signed)
Rebecca [name] - 56 y.o. female MRN [id number]  Date of birth: Oct 08, 1966  Office Visit Note: Visit Date: 09/10/2022 PCP: [name], MD Referred by: [name], MD  Subjective: Chief Complaint  Patient presents with   Right Foot - Pain   HPI: Rebecca [name] is a pleasant 56 y.o. female who presents today for acute right foot pain.  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  Right foot pain - has pain over the first great toe.  She does have a bunion on the left side.  She was concerned about fractures today.  She actually came over from physical therapy today.  Occasional get some swelling, no redness or warmth.  She denies any injury.  Bilateral leg and hip pain -this has been improving with formalized physical therapy.  Of note this has been present for the last 6 years or more.  She does stand for longer periods of time as she works at [employer] and this will exacerbate pain in her feet as well.  At our last visit, I did order a DEXA scan.  She has been unable to have this performed.  Pertinent ROS were reviewed with the patient and found to be negative unless otherwise specified above in HPI.   Assessment & Plan: Visit Diagnoses:  1. Pain in right ankle and joints of right foot   2. Femoral neck stress fracture, sequela   3. Bilateral foot pain   4. Bunion of great toe of left foot    Plan: Discussed with Davanee today that I do not see any fracture of the big toe.  I discussed with her as well that the left great toe is not fractured, this is a bunion deformity.  Did write this down so she can look up information about this.  She gets pain in both of the feet with prolonged standing, I think this is more so mechanical with her significant pes planus and hindfoot valgus.  I did fit her for some orthotic insoles today to help support her arch, she found these to be comfortable.  She will continue  her formalized therapy as this is certainly helping her chronic femoral neck stress fractures.  Did recommend that she get her DEXA scan, I have my office staff track this order down so that they can help her get this scheduled so we can evaluate her bone quality.  The meloxicam is helping with her pain, I did reorder this in decrease this to 7.5 mg so she can take this twice daily as she prefers.  We will follow-up with me after her DEXA scan to discuss next steps.   Follow-up: Return if symptoms worsen or fail to improve, for F/u after DEXA scan completed.   Meds & Orders:  Meds ordered this encounter  Medications   DISCONTD: meloxicam (MOBIC) 15 MG tablet    Sig: Take 1 tablet (15 mg total) by mouth daily.    Dispense:  30 tablet    Refill:  0   meloxicam (MOBIC) 7.5 MG tablet    Sig: Take 1 tablet (7.5 mg total) by mouth 2 (two) times daily.    Dispense:  60 tablet    Refill:  0    Orders Placed This Encounter  Procedures   XR Foot Complete Right     Procedures: No procedures performed      Clinical History: No specialty comments available.  She reports  that she has never smoked. She has been exposed to tobacco smoke. She has never used smokeless tobacco.  Recent Labs    07/11/22 1553  HGBA1C 9.5*    Objective:   Vital Signs: LMP  (LMP Unknown)   Physical Exam  Gen: Well-appearing, in no acute distress; non-toxic CV: Regular Rate. Well-perfused. Warm.  Resp: Breathing unlabored on room air; no wheezing. Psych: Fluid speech in conversation; appropriate affect; normal thought process Neuro: Sensation intact throughout. No gross coordination deficits.   Ortho Exam - Right foot: There is some TTP over the dorsal and plantar aspect of the great toe globally.  Some mild pain that extends into the arch.  There is some mild rigidity of the great toe at the IP and MTP joint.  No redness or swelling nor warmth.  - Left foot: + Mild to moderate bunion deformity.  No TTP over  the IP joint.  No redness or swelling.   There is notable significant flexible pes planus with hindfoot valgus and hyperpronation upon standing and walking phase.  Improved range of motion about the hips.  Nontender over the greater trochanters today.  Imaging: XR Foot Complete Right  Result Date: 09/10/2022 3 views of the right foot including AP, oblique and lateral femoral ordered and reviewed by myself.  X-rays demonstrate no acute fracture.  There are some mild OA with some medial sided spurring of the first MTP joint.  Notable flatfoot without significant midfoot arthritis.   MR FEMUR RIGHT W WO CONTRAST CLINICAL DATA:  Lucency on right femoral x-ray. History of breast cancer   EXAM: MRI OF THE RIGHT FEMUR WITHOUT AND WITH CONTRAST   TECHNIQUE: Multiplanar, multisequence MR imaging of the right femur was performed both before and after administration of intravenous contrast.   CONTRAST:  6.66m GADAVIST GADOBUTROL 1 MMOL/ML IV SOLN   COMPARISON:  Right femur radiograph 04/23/2022, abdominal radiograph 09/18/2020.   FINDINGS: Bones/Joint/Cartilage   There are chronic stress fractures of the medial basilar femoral necks. There is no associated marrow edema. There is corresponds to the lucency and surrounding sclerosis a recent right femur radiograph. Sclerosis of the femoral necks is also seen on prior abdominal radiograph November 2021. There is moderate bilateral osteoarthritis with probable degenerative anterior superior labral tearing. No evidence of acute fracture or avascular necrosis.   Muscles and Tendons   There is tendinosis and peritendinitis of the right distal gluteus minimus and to a lesser degree the medius tendon. No acute tendon tear. Mild tendinosis and peritendinitis of the right proximal hamstrings. No significant muscle edema or muscle atrophy.   Soft tissues   No focal fluid collection. No evidence of soft tissue mass. Thickened endometrium  with internal heterogeneity. There is a 0.8 cm subserosal fibroid anteriorly.   IMPRESSION: Chronic stress fractures of the medial basilar femoral necks bilaterally. No associated edema to suggest an acute component. No evidence of focal bone lesion/osseous metastasis.   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   Tendinosis and peritonitis of the right distal gluteus minimus and medius tendons. No acute gluteal tendon tear.   Mild tendinosis and peritendinitis of the right proximal hamstrings.   Thickened endometrium with internal heterogeneity. Correlate with any history of abnormal uterine bleeding and recommend pelvic ultrasound, especially if postmenopausal.   Electronically Signed   By: JMaurine SimmeringM.D.   On: 06/01/2022 09:53  Past Medical/Family/Surgical/Social History: Medications & Allergies reviewed per EMR, new medications updated. Patient Active Problem List  Diagnosis Date Noted   Chest pain 01/30/2022   Arthralgia of shoulder 12/11/2021   Right otitis media 09/26/2021   Port-A-Cath in place 02/07/2021   Genetic testing 02/02/2021   Family history of throat cancer 01/18/2021   Family history of lymphoma 01/18/2021   Malignant neoplasm of lower-inner quadrant of right breast of female, estrogen receptor positive ([place]) 01/12/2021   Posterior tibial tendinitis, right leg 01/26/2020   Diabetes ([place]) 03/11/2017   Muscle pain 08/06/2013   Tiredness 08/06/2013   Candida vaginitis 08/19/2012   Dental cavities 08/19/2012   Knee pain, bilateral 04/16/2012   Diabetes mellitus type 2, controlled ([place]) 04/16/2012   Hyperlipidemia 04/16/2012   Palpitations 04/16/2012   Back pain 04/16/2012   Past Medical History:  Diagnosis Date   Arthritis    Breast cancer ([place])    Diabetes mellitus    Family history of lymphoma 01/18/2021   Family history of throat cancer 01/18/2021   Hyperlipidemia    Hypertension    Family  History  Problem Relation Age of Onset   Diabetes Mother    Heart disease Mother    Diabetes Father    Throat cancer Father        d. 71s   Diabetes Sister    Diabetes Brother    Lymphoma Cousin        maternal cousin; d. 69s   Past Surgical History:  Procedure Laterality Date   BREAST LUMPECTOMY WITH RADIOACTIVE SEED AND SENTINEL LYMPH NODE BIOPSY Right 07/11/2021   Procedure: RIGHT BREAST LUMPECTOMY WITH RADIOACTIVE SEED AND RIGHT SENTINEL LYMPH NODE BIOPSY;  Surgeon: Erroll Luna, MD;  Location: [facility];  Service: General;  Laterality: Right;   CESAREAN SECTION     PORT-A-CATH REMOVAL Right 05/22/2022   Procedure: REMOVAL PORT-A-CATH;  Surgeon: Erroll Luna, MD;  Location: [facility];  Service: General;  Laterality: Right;   PORTACATH PLACEMENT N/A 01/26/2021   Procedure: INSERTION PORT-A-CATH;  Surgeon: Erroll Luna, MD;  Location: [facility];  Service: General;  Laterality: N/A;  60   RADIOACTIVE SEED GUIDED AXILLARY SENTINEL LYMPH NODE Right 07/11/2021   Procedure: RADIOACTIVE SEED GUIDED RIGHT AXILLARY LYMPH NODE BIOPSY;  Surgeon: Erroll Luna, MD;  Location: [facility];  Service: General;  Laterality: Right;   Social History   Occupational History   Not on file  Tobacco Use   Smoking status: Never    Passive exposure: Yes   Smokeless tobacco: Never  Vaping Use   Vaping Use: Never used  Substance and Sexual Activity   Alcohol use: No   Drug use: No   Sexual activity: Not Currently

## 2022-09-10 NOTE — Therapy (Signed)
Note created in error. Pt arrived and stated that she think's she broke her foot. She was sent to get an x-ray.

## 2022-09-10 NOTE — Progress Notes (Signed)
Right great toe pain Couple days of pain; no injury History of bunion on the left  States the toe swells, and goes into the arch

## 2022-09-17 ENCOUNTER — Encounter: Payer: Self-pay | Admitting: Physical Therapy

## 2022-09-17 ENCOUNTER — Ambulatory Visit (INDEPENDENT_AMBULATORY_CARE_PROVIDER_SITE_OTHER): Payer: Commercial Managed Care - HMO | Admitting: Physical Therapy

## 2022-09-17 DIAGNOSIS — M25551 Pain in right hip: Secondary | ICD-10-CM | POA: Diagnosis not present

## 2022-09-17 DIAGNOSIS — M25552 Pain in left hip: Secondary | ICD-10-CM

## 2022-09-17 DIAGNOSIS — M25562 Pain in left knee: Secondary | ICD-10-CM

## 2022-09-17 DIAGNOSIS — G8929 Other chronic pain: Secondary | ICD-10-CM

## 2022-09-17 DIAGNOSIS — M25561 Pain in right knee: Secondary | ICD-10-CM | POA: Diagnosis not present

## 2022-09-17 DIAGNOSIS — M6281 Muscle weakness (generalized): Secondary | ICD-10-CM

## 2022-09-17 NOTE — Therapy (Addendum)
OUTPATIENT PHYSICAL THERAPY TREATMENT NOTE [facility]   Patient Name: Rebecca [name] MRN: [id number] DOB:1966-04-19, 56 y.o., female Today's Date: 09/17/2022  END OF SESSION:   PT End of Session - 09/17/22 1325     Visit Number 5    Number of Visits 8    Date for PT Re-Evaluation 09/28/22    Authorization Type [employer] $15 copay; 30 visit limit    PT Start Time 7619    PT Stop Time 1345    PT Time Calculation (min) 40 min    Activity Tolerance Patient tolerated treatment well    Behavior During Therapy WFL for tasks assessed/performed               Past Medical History:  Diagnosis Date   Arthritis    Breast cancer ([place])    Diabetes mellitus    Family history of lymphoma 01/18/2021   Family history of throat cancer 01/18/2021   Hyperlipidemia    Hypertension    Past Surgical History:  Procedure Laterality Date   BREAST LUMPECTOMY WITH RADIOACTIVE SEED AND SENTINEL LYMPH NODE BIOPSY Right 07/11/2021   Procedure: RIGHT BREAST LUMPECTOMY WITH RADIOACTIVE SEED AND RIGHT SENTINEL LYMPH NODE BIOPSY;  Surgeon: Erroll Luna, MD;  Location: [facility];  Service: General;  Laterality: Right;   CESAREAN SECTION     PORT-A-CATH REMOVAL Right 05/22/2022   Procedure: REMOVAL PORT-A-CATH;  Surgeon: Erroll Luna, MD;  Location: [facility];  Service: General;  Laterality: Right;   PORTACATH PLACEMENT N/A 01/26/2021   Procedure: INSERTION PORT-A-CATH;  Surgeon: Erroll Luna, MD;  Location: [facility];  Service: General;  Laterality: N/A;  [place] Right 07/11/2021   Procedure: RADIOACTIVE SEED GUIDED RIGHT AXILLARY LYMPH NODE BIOPSY;  Surgeon: Erroll Luna, MD;  Location: [facility];  Service: General;  Laterality: Right;   Patient Active Problem List   Diagnosis Date Noted   Chest pain 01/30/2022   Arthralgia of shoulder 12/11/2021   Right otitis media 09/26/2021   Port-A-Cath in  place 02/07/2021   Genetic testing 02/02/2021   Family history of throat cancer 01/18/2021   Family history of lymphoma 01/18/2021   Malignant neoplasm of lower-inner quadrant of right breast of female, estrogen receptor positive ([place]) 01/12/2021   Posterior tibial tendinitis, right leg 01/26/2020   Diabetes ([place]) 03/11/2017   Muscle pain 08/06/2013   Tiredness 08/06/2013   Candida vaginitis 08/19/2012   Dental cavities 08/19/2012   Knee pain, bilateral 04/16/2012   Diabetes mellitus type 2, controlled ([place]) 04/16/2012   Hyperlipidemia 04/16/2012   Palpitations 04/16/2012   Back pain 04/16/2012     THERAPY DIAG:  Right hip pain  Pain in left hip  Chronic pain of right knee  Chronic pain of left knee  Muscle weakness (generalized)   PCP: [name], MD  REFERRING PROVIDER: Elba Barman, DO  REFERRING DIAG: [phone] (ICD-10-CM) - Femoral neck stress fracture, initial encounter M25.561,M25.562,G89.29 (ICD-10-CM) - Chronic pain of both knees  EVAL THERAPY DIAG:  Right hip pain  Pain in left hip  Chronic pain of right knee  Chronic pain of left knee  Muscle weakness (generalized)  Pain in right ankle and joints of right foot  Rationale for Evaluation and Treatment Rehabilitation  ONSET DATE: Ongoing for the last 6 years  SUBJECTIVE:   SUBJECTIVE STATEMENT: Pt arriving after seeing Dr. Rolena Infante on 09/10/22 to rule of ankle fracture. Images were negative. Pt stating she was  issued shoe inserts and they have helped her foot pain.    PERTINENT HISTORY: -Bilateral femoral neck stress fractures  - h/o Breast Cancer c lymph node removal  PAIN:  NPRS scale: 6/10 pain in Rt lower leg Rt great toe Pain description: achy, throbbing, sharp Aggravating factors: standing Relieving factors: tylenol,   PRECAUTIONS: None  WEIGHT BEARING RESTRICTIONS No  FALLS:  Has patient fallen in last 6 months? No  LIVING ENVIRONMENT: Lives with: lives with their family  and lives with their spouse Lives in: House/apartment  Has following equipment at home: None  OCCUPATION: standing on several hours each day  PLOF: Independent  PATIENT GOALS Get stronger, stop hurting   OBJECTIVE:   DIAGNOSTIC FINDINGS:  IMPRESSION: Chronic stress fractures of the medial basilar femoral necks bilaterally. No associated edema to suggest an acute component. No evidence of focal bone lesion/osseous metastasis.   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   Tendinosis and peritonitis of the right distal gluteus minimus and medius tendons. No acute gluteal tendon tear.   Mild tendinosis and peritendinitis of the right proximal hamstrings.  PATIENT SURVEYS:  07/30/22: FOTO intake: 41  predicted:  47  COGNITION: Overall cognitive status: WFL    SENSATION: 07/30/22: WFL    MUSCLE LENGTH: 07/30/22 Hamstrings: Right  58 deg; Left 75 deg   POSTURE:  07/30/22: rounded shoulders and forward head  PALPATION: 07/30/22: TTP, Rt hamstring origin and muscle belly, Rt lateral knee joint line, Left lateral knee joint   LOWER EXTREMITY ROM:  Active ROM Right 07/30/22 Left 07/30/22  Hip flexion 90 c pain 102  Hip extension 10 15  Hip abduction 20 25  Hip adduction    Hip internal rotation    Hip external rotation    Knee flexion 125 134  Knee extension 0 0  Ankle dorsiflexion    Ankle plantarflexion    Ankle inversion    Ankle eversion     (Blank rows = not tested)  LOWER EXTREMITY MMT:  MMT Right 07/30/22 Left 07/30/22  Hip flexion 3/5 3+/5  Hip extension    Hip abduction 3/5 3/5  Hip adduction 3/5 3/5  Hip internal rotation    Hip external rotation    Knee flexion 3/5 4/5  Knee extension 3/5 4/5  Ankle dorsiflexion 3/5 4/5  Ankle plantarflexion 3/5 4/5  Ankle inversion 2/5 4/5  Ankle eversion 2/5 4/5   (Blank rows = not tested)    FUNCTIONAL TESTS:  07/30/22:  5 times sit to stand 22 seconds  with UE support 09/17/22:  5 time sit to stand: 16 seconds c UE support  GAIT: Distance walked: 50 feet  Assistive device utilized: None Level of assistance: Complete Independence Comments: pt with step through gait pattern with wide BOS   TODAY'S TREATMENT: 09/17/22:  TherEx: Calf stretch: x 2 holding 30 sec LAQ: 3# 2 x 10 holding 3 sec Sit to stand: 2 x 5 Heel/toe lifts: x 20 c UE support Hamstring curls: level 2 band 2 x 10 each LE Bridges:  2 x 10 holding 3 sec SLR: 2 x 10 each LE Manual: Percussion to bil quads, bil gastroc/soleus, bil hamstrings, and Rt piriformis    09/03/2022 Therex [employer] bike LEs only  x 8 min; seat 8; level 1 Calf stretch on slant board x 2 holding 30 sec Standing hip abd x 20 each LE c UE support Standing hamstring curs x 15 c UE support  Leg Press: 50#  bil LE 2 x 15  Seated hamstring stretch 2x30 sec bil Ankle ABC's     08/27/2022 Therex [employer] bike LEs only  x 8 min; seat 8; level 1 Seated hamstring stretch 2x30 sec bil Seated LAQ BLEs 10 reps bil Seated hamstring curl green theraband controlled eccentric 10 reps ea LE  Standing alternating hip abd 10 reps ea LE 2 sets  Standing alternating hip ext 10 reps ea LE 2 sets  Therapeutic Activities: Sit to/from stand without UE support x 10 reps. PT demo & verbal cues on proper technique.  PT demo & verbal cues on supine <>sit via sidelying for proper mechanics. Pt verbalized & return demo understanding. PT demo & verbal cues on positioning sidelying with pillows. Pt verbalized understanding.    08/13/22 Therex [employer] bike x 5 min; seat 8; L2.5 Seated hamstring slide x 20 reps bil Seated hamstring stretch 2x30 sec bil Long sitting bil quad set x 20 reps Small range SLR supine x 10 reps bil Sidelying hip abduction x 10 reps bil Supine active hamstring stretch 10 x 5 sec hold Prone hip extension x 10 reps bil Sit to/from stand without UE support x 5 reps  Manual IASTM with  percussive device to Lt hamstring in prone      PATIENT EDUCATION:  Education details: HEP, POC Person educated: Patient Education method: Consulting civil engineer, Demonstration, Verbal cues, and Handouts Education comprehension: verbalized understanding, returned demonstration, and verbal cues required    HOME EXERCISE PROGRAM: Access Code: [email or web address] URL: [email or web address] Date: 07/30/2022 Prepared by: Kearney Hard  Exercises - Supine Quad Set  - 2 x daily - 7 x weekly - 10 reps - 5 seconds hold - Seated Knee Flexion Slide  - 2 x daily - 7 x weekly - 20 reps - Seated Hamstring Stretch  - 2 x daily - 7 x weekly - 3 reps - 30 seconds hold  (Medbridge is down and unable to add to pt's HEP) Ankle ABC's Rolling Plantar fascia with tennis ball, frozen water bottle DF stretch with hamstring stretch  ASSESSMENT:  CLINICAL IMPRESSION: Pt arriving to therapy a few minutes late with interpreter. Pt reporting muscle fatigue throughout session. Pt reporting increased pain in bilateral LE's. Percussion performed to bil quads, hamstrings, right piriformis, and calves. Pt with a good response. Continue skilled PT progressing pt toward LTG's set.   OBJECTIVE IMPAIRMENTS decreased mobility, difficulty walking, decreased ROM, decreased strength, increased edema, increased muscle spasms, impaired flexibility, and pain.   ACTIVITY LIMITATIONS bending, sitting, standing, squatting, sleeping, and stairs  PARTICIPATION LIMITATIONS: community activity and occupation  PERSONAL FACTORS 3+ comorbidities: breast CA, HTN, DM are also affecting patient's functional outcome.   REHAB POTENTIAL: fair to good, limited tolerance to gentle ROM exercises this visit  CLINICAL DECISION MAKING: Evolving/moderate complexity  EVALUATION COMPLEXITY: Moderate   GOALS: Goals reviewed with patient? Yes  Short term PT Goals (target date for Short term goals are 3 weeks 08/24/22) Patient will  demonstrate independent use of home exercise program to maintain progress from in clinic treatments. Goal status: New   Long term PT goals (target dates for all long term goals are 10 weeks  09/28/22 )   1. Patient will demonstrate/report pain at worst less than or equal to 2/10 to facilitate minimal limitation in daily activities including job related functions secondary to pain symptoms. Goal status: New   2. Patient will demonstrate independent use of home exercise program to facilitate ability to maintain/progress functional gains from skilled physical therapy  services. Goal status: New   3. Patient will demonstrate FOTO outcome > or = 47 % to indicate reduced disability due to condition. Goal status: New   4.  Patient will demonstrate  LE MMT 4/5 throughout to faciltiate usual transfers, stairs, squatting at [facility] for daily life.   Goal status: New   5.  Pt will improve her 5 time sit to stand to </= 14 seconds with no UE support.    Goal status: New  6. Pt will be able to bend over and pick up 5 # object from floor and place on counter with pain </= 2/10 in bilateral LE's/   A. Goal Status:             PLAN:  PT FREQUENCY: 1-2x/week  PT DURATION: 8 weeks  PLANNED INTERVENTIONS: Therapeutic exercises, Therapeutic activity, Neuro Muscular re-education, Balance training, Gait training, Patient/Family education, Joint mobilization, Stair training, DME instructions, Dry Needling, Electrical stimulation, Traction, Cryotherapy, Moist heat, Taping, Ultrasound, Ionotophoresis '4mg'$ /ml Dexamethasone, and Manual therapy.  All included unless contraindicated   PLAN FOR NEXT SESSION: Continue LE ROM and strengthening to pt's tolerance    Oretha Caprice, PT, MPT 09/17/2022, 1:48 PM     PHYSICAL THERAPY DISCHARGE SUMMARY  Visits from Start of Care: 5  Current functional level related to goals / functional outcomes: See note   Remaining deficits: See note   Education /  Equipment: HEP  Patient goals were partially met. Patient is being discharged due to not returning since the last visit.  Scot Jun, PT, DPT, OCS, ATC 10/31/22  3:30 PM

## 2022-09-18 ENCOUNTER — Telehealth: Payer: Self-pay | Admitting: Family Medicine

## 2022-09-18 NOTE — Telephone Encounter (Signed)
Copied from [place] [id number]. Topic: General - Other >> Sep 18, 2022  2:23 PM Ludger Nutting wrote: Anderson Malta, Nurse Case Manager with [facility] called and stated that patient is not taking dapagliflozin propanediol (FARXIGA) 10 MG TABS tablet due to cost. Is there any samples or an alternative that can be given.

## 2022-09-19 ENCOUNTER — Other Ambulatory Visit: Payer: Self-pay

## 2022-09-19 NOTE — Telephone Encounter (Signed)
Anegam friend. The original Farxiga rx was sent to [facility] on [address]. Per Claiborne Billings in our pharmacy, Wilder Glade is $15 for a 30 day supply. Can we contact her with this information? If she can afford that, I recommend to have the [place] filled here at our pharmacy.Let me know if she is amenable, and I will send the rx here.

## 2022-09-24 ENCOUNTER — Encounter: Payer: Commercial Managed Care - HMO | Admitting: Physical Therapy

## 2022-09-24 ENCOUNTER — Telehealth: Payer: Self-pay | Admitting: Physical Therapy

## 2022-09-24 NOTE — Telephone Encounter (Signed)
Pt did not show for her 1:00 pm PT appointment today. I had the interpreter call pt and leave a message to call and reschedule her missed visit and to make any further appointments. Currently pt does not have any scheduled.  Our clinic number [phone] was left.   Kearney Hard, PT, MPT 09/24/22 1:21 PM

## 2022-10-01 ENCOUNTER — Ambulatory Visit: Payer: Commercial Managed Care - HMO | Admitting: Obstetrics and Gynecology

## 2022-10-09 ENCOUNTER — Ambulatory Visit: Payer: Commercial Managed Care - HMO | Attending: Family Medicine | Admitting: Family Medicine

## 2022-10-09 ENCOUNTER — Encounter: Payer: Self-pay | Admitting: Family Medicine

## 2022-10-09 VITALS — BP 123/77 | HR 95 | Temp 98.5°F | Ht 63.0 in | Wt 158.4 lb

## 2022-10-09 DIAGNOSIS — G620 Drug-induced polyneuropathy: Secondary | ICD-10-CM

## 2022-10-09 DIAGNOSIS — E785 Hyperlipidemia, unspecified: Secondary | ICD-10-CM

## 2022-10-09 DIAGNOSIS — E1159 Type 2 diabetes mellitus with other circulatory complications: Secondary | ICD-10-CM | POA: Diagnosis not present

## 2022-10-09 DIAGNOSIS — T451X5A Adverse effect of antineoplastic and immunosuppressive drugs, initial encounter: Secondary | ICD-10-CM

## 2022-10-09 DIAGNOSIS — E119 Type 2 diabetes mellitus without complications: Secondary | ICD-10-CM | POA: Diagnosis not present

## 2022-10-09 DIAGNOSIS — E1169 Type 2 diabetes mellitus with other specified complication: Secondary | ICD-10-CM | POA: Diagnosis not present

## 2022-10-09 DIAGNOSIS — M79671 Pain in right foot: Secondary | ICD-10-CM

## 2022-10-09 DIAGNOSIS — I152 Hypertension secondary to endocrine disorders: Secondary | ICD-10-CM

## 2022-10-09 DIAGNOSIS — Z1211 Encounter for screening for malignant neoplasm of colon: Secondary | ICD-10-CM

## 2022-10-09 LAB — GLUCOSE, POCT (MANUAL RESULT ENTRY): POC Glucose: 150 mg/dl — AB (ref 70–99)

## 2022-10-09 LAB — POCT GLYCOSYLATED HEMOGLOBIN (HGB A1C): HbA1c, POC (controlled diabetic range): 7.8 % — AB (ref 0.0–7.0)

## 2022-10-09 MED ORDER — GABAPENTIN 300 MG PO CAPS: 300.0000 mg | ORAL_CAPSULE | Freq: Every day | ORAL | 4 refills | Status: DC

## 2022-10-09 NOTE — Progress Notes (Signed)
Pain in right leg.

## 2022-10-09 NOTE — Progress Notes (Signed)
Subjective:  Patient ID: Rebecca [name], female    DOB: Mar 01, 1966  Age: 56 y.o. MRN: [id number]  CC: Diabetes   HPI Rebecca [name] is a 56 y.o. year old female with a history of type 2 diabetes mellitus (A1c 7.8), hyperlipidemia, hypertension, stage IIIB R breast ca ( cT2, CN1, cM0 triple neg ckinically but 10%ER+, status post lumpectomy, neoadjuvant chemotherapy, adjuvant radiation.)   Interval History:  She Complains of pain on the medial aspect of her right foot and medial aspect of right big toe. She saw Ortho last month for this and was prescribed Meloxicam.  X-rays were negative for fracture. Now she experiences tingling and prolonged standing worsens the pain which is rated as 9/10. Gabapentin appears on her med list but she states she has not been taking it.  A1c is 7.8 down from 9.5 and she endorses adherence with her diabetes regimen.  Denies presence of hypoglycemia, blurry vision. Also adherent with her statin and antihypertensive.  I had referred her to OB/GYN due to findings of fibroid on her imaging studies she missed that appointment and is requesting a number to reschedule her appointment. Last seen at the oncology clinic in 07/2022 and remains on maintenance with Keytruda.  Per notes no evidence of clinical or radiologic recurrence Past Medical History:  Diagnosis Date   Arthritis    Breast cancer ([place])    Diabetes mellitus    Family history of lymphoma 01/18/2021   Family history of throat cancer 01/18/2021   Hyperlipidemia    Hypertension     Past Surgical History:  Procedure Laterality Date   BREAST LUMPECTOMY WITH RADIOACTIVE SEED AND SENTINEL LYMPH NODE BIOPSY Right 07/11/2021   Procedure: RIGHT BREAST LUMPECTOMY WITH RADIOACTIVE SEED AND RIGHT SENTINEL LYMPH NODE BIOPSY;  Surgeon: Erroll Luna, MD;  Location: [place];  Service: General;  Laterality: Right;   CESAREAN SECTION     PORT-A-CATH REMOVAL Right 05/22/2022    Procedure: REMOVAL PORT-A-CATH;  Surgeon: Erroll Luna, MD;  Location: [place];  Service: General;  Laterality: Right;   PORTACATH PLACEMENT N/A 01/26/2021   Procedure: INSERTION PORT-A-CATH;  Surgeon: Erroll Luna, MD;  Location: [place];  Service: General;  Laterality: N/A;  9   [place] Right 07/11/2021   Procedure: RADIOACTIVE SEED GUIDED RIGHT AXILLARY LYMPH NODE BIOPSY;  Surgeon: Erroll Luna, MD;  Location: [facility];  Service: General;  Laterality: Right;    Family History  Problem Relation Age of Onset   Diabetes Mother    Heart disease Mother    Diabetes Father    Throat cancer Father        d. 51s   Diabetes Sister    Diabetes Brother    Lymphoma Cousin        maternal cousin; d. 40s    Social History   Socioeconomic History   Marital status: Divorced    Spouse name: Not on file   Number of children: Not on file   Years of education: Not on file   Highest education level: Not on file  Occupational History   Not on file  Tobacco Use   Smoking status: Never    Passive exposure: Yes   Smokeless tobacco: Never  Vaping Use   Vaping Use: Never used  Substance and Sexual Activity   Alcohol use: No   Drug use: No   Sexual activity: Not Currently  Other Topics Concern   Not  on file  Social History Narrative   Not on file   Social Determinants of Health   Financial Resource Strain: Not on file  Food Insecurity: Not on file  Transportation Needs: No Transportation Needs (01/18/2021)   PRAPARE - Hydrologist (Medical): No    Lack of Transportation (Non-Medical): No  Physical Activity: Not on file  Stress: Not on file  Social Connections: Not on file    Allergies  Allergen Reactions   Lisinopril Swelling    Swelling in throat, coughing, difficulty swallowing, could not sleep. Occurred Dec 2020.   Heparin Sod Firefighter) Lock Flush Other (See Comments)     Pork derived product    Outpatient Medications Prior to Visit  Medication Sig Dispense Refill   acetaminophen (TYLENOL) 500 MG tablet Take 1 tablet (500 mg total) by mouth 3 (three) times daily as needed (with one aleve tablet). 30 tablet 0   amLODipine (NORVASC) 10 MG tablet Take 1 tablet (10 mg total) by mouth daily. 90 tablet 1   Ascorbic Acid (VITAMIN C) 1000 MG tablet Take 1 tablet (1,000 mg total) by mouth daily. 30 tablet 0   azelastine (ASTELIN) 0.1 % nasal spray Place 2 sprays into both nostrils 2 (two) times daily. Use in each nostril as directed 30 mL 12   Blood Glucose Monitoring Suppl (ONETOUCH VERIO REFLECT) w/Device KIT 1 kit by Does not apply route in the morning, at noon, and at bedtime. Use as directed to test blood sugar up to three times daily.E11.9 1 kit 0   cetirizine (ZYRTEC) 10 MG tablet Take 1 tablet (10 mg total) by mouth daily. 30 tablet 0   dapagliflozin propanediol (FARXIGA) 10 MG TABS tablet TAKE 1 TABLET(10 MG) BY MOUTH DAILY WITH BREAKFAST 90 tablet 1   ergocalciferol (DRISDOL) 1.25 MG (50000 UT) capsule Take 1 capsule (50,000 Units total) by mouth once a week. 12 capsule 1   fluticasone (FLONASE) 50 MCG/ACT nasal spray Place 2 sprays into both nostrils daily. 15.8 mL 2   glipiZIDE (GLUCOTROL) 10 MG tablet Take 1 tablet (10 mg total) by mouth daily. 90 tablet 1   glucose blood (ONETOUCH VERIO) test strip Use as directed to test blood sugar up to three times daily. E11.9 100 each 6   labetalol (NORMODYNE) 100 MG tablet Take 100 mg by mouth 2 (two) times daily.     Lancets (ONETOUCH DELICA PLUS [id number]) MISC 1 Device by Does not apply route in the morning, at noon, and at bedtime. Use as directed to test blood sugar up to three times daily. E11.9 100 each 6   meloxicam (MOBIC) 7.5 MG tablet Take 1 tablet (7.5 mg total) by mouth 2 (two) times daily. 60 tablet 0   metFORMIN (GLUCOPHAGE) 500 MG tablet TAKE 2 TABLETS(1000 MG) BY MOUTH TWICE DAILY 360 tablet 1    methocarbamol (ROBAXIN) 500 MG tablet Take 1 tablet (500 mg total) by mouth every 8 (eight) hours as needed for muscle spasms. 90 tablet 0   Omega-3 1000 MG CAPS Take 1 capsule (1,000 mg total) by mouth daily. 30 capsule 0   omeprazole (PRILOSEC) 20 MG capsule Take 1 capsule (20 mg total) by mouth daily. 30 capsule 0   rosuvastatin (CRESTOR) 20 MG tablet Take 1 tablet (20 mg total) by mouth daily. 90 tablet 1   vitamin B-12 (CYANOCOBALAMIN) 100 MCG tablet Take 1 tablet (100 mcg total) by mouth daily. 30 tablet 0   gabapentin (NEURONTIN) 100 MG capsule  Take 1 capsule (100 mg total) by mouth 2 (two) times daily. Take early in the morning and at midday. 60 capsule 6   gabapentin (NEURONTIN) 300 MG capsule Take 1 capsule (300 mg total) by mouth at bedtime. 90 capsule 4   amoxicillin-clavulanate (AUGMENTIN) 875-125 MG tablet Take 1 tablet by mouth every 12 (twelve) hours. (Patient not taking: Reported on 10/09/2022) 14 tablet 0   fluconazole (DIFLUCAN) 100 MG tablet Take 1 tablet (100 mg total) by mouth daily. (Patient not taking: Reported on 10/09/2022) 10 tablet 0   nystatin (MYCOSTATIN) 100000 UNIT/ML suspension Take 5 mLs (500,000 Units total) by mouth 4 (four) times daily. (Patient not taking: Reported on 10/09/2022) 60 mL 0   No facility-administered medications prior to visit.     ROS Review of Systems  Constitutional:  Negative for activity change and appetite change.  HENT:  Negative for sinus pressure and sore throat.   Respiratory:  Negative for chest tightness, shortness of breath and wheezing.   Cardiovascular:  Negative for chest pain and palpitations.  Gastrointestinal:  Negative for abdominal distention, abdominal pain and constipation.  Genitourinary: Negative.   Musculoskeletal:        See HPI  Psychiatric/Behavioral:  Negative for behavioral problems and dysphoric mood.     Objective:  BP 123/77   Pulse 95   Temp 98.5 F (36.9 C) (Oral)   Ht _0  (1.6 m)   Wt 158 lb  6.4 oz (71.8 kg)   SpO2 98%   BMI 28.06 kg/m      10/09/2022   11:01 AM 09/01/2022    4:43 PM 08/20/2022    3:11 PM  BP/Weight  Systolic BP 244 975 300  Diastolic BP 77 88 65  Wt. (Lbs) 158.4    BMI 28.06 kg/m2        Physical Exam Constitutional:      Appearance: She is well-developed.  Cardiovascular:     Rate and Rhythm: Normal rate.     Heart sounds: Normal heart sounds. No murmur heard. Pulmonary:     Effort: Pulmonary effort is normal.     Breath sounds: Normal breath sounds. No wheezing or rales.  Chest:     Chest wall: No tenderness.  Abdominal:     General: Bowel sounds are normal. There is no distension.     Palpations: Abdomen is soft. There is no mass.     Tenderness: There is no abdominal tenderness.  Musculoskeletal:     Right lower leg: Edema present.     Left lower leg: No edema.     Comments: See foot exam below  Neurological:     Mental Status: She is alert and oriented to person, place, and time.  Psychiatric:        Mood and Affect: Mood normal.    Diabetic Foot Exam - Simple   Simple Foot Form Diabetic Foot exam was performed with the following findings: Yes 10/09/2022 11:31 AM  Visual Inspection See comments: Yes Sensation Testing Intact to touch and monofilament testing bilaterally: Yes Pulse Check Posterior Tibialis and Dorsalis pulse intact bilaterally: Yes Comments Slight edema and TTP of right bunion        Latest Ref Rng & Units 08/20/2022    2:04 AM 08/06/2022    1:16 PM 07/16/2022    8:45 AM  CMP  Glucose 70 - 99 mg/dL 171  246  196   BUN 6 - 20 mg/dL _1 Creatinine 0.44 -  1.00 mg/dL 0.68  0.73  0.63   Sodium 135 - 145 mmol/L 140  141  136   Potassium 3.5 - 5.1 mmol/L 4.0  4.4  3.2   Chloride 98 - 111 mmol/L 104  106  88   CO2 22 - 32 mmol/L _0 Calcium 8.9 - 10.3 mg/dL 9.7  9.6  10.0   Total Protein 6.5 - 8.1 g/dL 7.2  7.4  8.0   Total Bilirubin 0.3 - 1.2 mg/dL 0.6  0.3  0.5   Alkaline Phos 38 -  126 U/L 67  71  105   AST 15 - 41 U/L 22  19  43   ALT 0 - 44 U/L 19  22  40     Lipid Panel     Component Value Date/Time   CHOL 309 (H) 07/16/2022 0845   TRIG 577 (HH) 07/16/2022 0845   HDL 44 07/16/2022 0845   CHOLHDL 2.5 11/11/2020 0908   CHOLHDL 5.5 (H) 11/02/2016 1102   VLDL 80 (H) 11/02/2016 1102   LDLCALC 153 (H) 07/16/2022 0845    CBC    Component Value Date/Time   WBC 9.4 08/20/2022 0204   RBC 4.86 08/20/2022 0204   HGB 13.0 08/20/2022 0204   HGB 13.0 08/06/2022 1316   HCT 39.4 08/20/2022 0204   PLT 242 08/20/2022 0204   PLT 237 08/06/2022 1316   MCV 81.1 08/20/2022 0204   MCH 26.7 08/20/2022 0204   MCHC 33.0 08/20/2022 0204   RDW 14.1 08/20/2022 0204   LYMPHSABS 0.9 08/20/2022 0204   MONOABS 0.7 08/20/2022 0204   EOSABS 0.2 08/20/2022 0204   BASOSABS 0.0 08/20/2022 0204    Lab Results  Component Value Date   HGBA1C 7.8 (A) 10/09/2022    Assessment & Plan:  1. Type 2 diabetes mellitus without complication, without long-term current use of insulin (HCC) Suboptimal control with A1c of 7.8 but this has improved from 9.5 previously Continue metformin, glipizide and Farxiga If A1c remains above goal of less than 7 at next visit consider regimen adjustment Counseled on Diabetic diet, my plate method, 466 minutes of moderate intensity exercise/week Blood sugar logs with fasting goals of 80-120 mg/dl, random of less than 180 and in the event of sugars less than 60 mg/dl or greater than 400 mg/dl encouraged to notify the clinic. Advised on the need for annual eye exams, annual foot exams, Pneumonia vaccine. - POCT glucose (manual entry) - POCT glycosylated hemoglobin (Hb A1C)  2. Hypertension associated with diabetes ([place]) Controlled Continue current antihypertensive regimen Counseled on blood pressure goal of less than 130/80, low-sodium, DASH diet, medication compliance, 150 minutes of moderate intensity exercise per week. Discussed medication compliance,  adverse effects.  3. Hyperlipidemia associated with type 2 diabetes mellitus ([place]) Controlled Continue Crestor Low-cholesterol diet - LP+Non-HDL Cholesterol; Future  4. Peripheral neuropathy due to chemotherapy [facility]) Uncontrolled She has diabetic neuropathy coupled with peripheral neuropathy due to chemo Will restart gabapentin - gabapentin (NEURONTIN) 300 MG capsule; Take 1 capsule (300 mg total) by mouth at bedtime.  Dispense: 90 capsule; Refill: 4  5. Right foot pain She does have some slight inflammation of her right medial foot Will need to exclude gout Continue meloxicam Followed by orthopedics - Uric Acid; Future  6. Screening for colon cancer - Ambulatory referral to Gastroenterology    Meds ordered this encounter  Medications   gabapentin (NEURONTIN) 300 MG capsule    Sig: Take 1 capsule (300  mg total) by mouth at bedtime.    Dispense:  90 capsule    Refill:  4    Follow-up: Return in about 3 months (around 01/08/2023) for Chronic medical conditions.       [name], MD, FAAFP. [facility] [place], [place]   10/09/2022, 1:28 PM

## 2022-10-09 NOTE — Patient Instructions (Signed)
Please call for your OB/GYN appointment: Placed in [place] [address] [place], [place] [address] Phone [phone]

## 2022-10-10 ENCOUNTER — Ambulatory Visit: Payer: Commercial Managed Care - HMO | Attending: Family Medicine

## 2022-10-10 DIAGNOSIS — M79671 Pain in right foot: Secondary | ICD-10-CM

## 2022-10-10 DIAGNOSIS — E1169 Type 2 diabetes mellitus with other specified complication: Secondary | ICD-10-CM

## 2022-10-11 LAB — LP+NON-HDL CHOLESTEROL
Cholesterol, Total: 185 mg/dL (ref 100–199)
HDL: 51 mg/dL (ref >39)
LDL Chol Calc (NIH): 92 mg/dL (ref 0–99)
Total Non-HDL-Chol (LDL+VLDL): 134 mg/dL — ABNORMAL HIGH (ref 0–129)
Triglycerides: 250 mg/dL — ABNORMAL HIGH (ref 0–149)
VLDL Cholesterol Cal: 42 mg/dL — ABNORMAL HIGH (ref 5–40)

## 2022-10-11 LAB — URIC ACID: Uric Acid: 5.3 mg/dL (ref 3.0–7.2)

## 2022-10-16 ENCOUNTER — Other Ambulatory Visit: Payer: Self-pay

## 2022-10-17 ENCOUNTER — Ambulatory Visit: Payer: Commercial Managed Care - HMO | Admitting: Family Medicine

## 2022-11-03 ENCOUNTER — Other Ambulatory Visit: Payer: Self-pay | Admitting: Sports Medicine

## 2022-11-17 ENCOUNTER — Other Ambulatory Visit: Payer: Self-pay | Admitting: Family Medicine

## 2022-11-19 NOTE — Telephone Encounter (Signed)
No longer on current medication list Requested Prescriptions  Pending Prescriptions Disp Refills   valsartan (DIOVAN) 40 MG tablet [Pharmacy Med Name: VALSARTAN '40MG'$  TABLETS] 30 tablet 3    Sig: TAKE 1 TABLET(40 MG) BY MOUTH DAILY     Cardiovascular:  Angiotensin Receptor Blockers Passed - 11/17/2022  3:32 AM      Passed - Cr in normal range and within 180 days    Creatinine  Date Value Ref Range Status  08/06/2022 0.73 0.44 - 1.00 mg/dL Final   Creat  Date Value Ref Range Status  11/02/2016 0.50 0.50 - 1.10 mg/dL Final   Creatinine, Ser  Date Value Ref Range Status  08/20/2022 0.68 0.44 - 1.00 mg/dL Final   Creatinine, Urine  Date Value Ref Range Status  11/02/2016 141 20 - 320 mg/dL Final         Passed - K in normal range and within 180 days    Potassium  Date Value Ref Range Status  08/20/2022 4.0 3.5 - 5.1 mmol/L Final         Passed - Patient is not pregnant      Passed - Last BP in normal range    BP Readings from Last 1 Encounters:  10/09/22 123/77         Passed - Valid encounter within last 6 months    Recent Outpatient Visits           1 month ago Type 2 diabetes mellitus without complication, without long-term current use of insulin ([place])   [place], [place], MD   3 months ago Type 2 diabetes mellitus without complication, without long-term current use of insulin [facility])   [place], [place], [place]   4 months ago Type 2 diabetes mellitus without complication, without long-term current use of insulin ([place])   [place], [place], MD   1 year ago Type 2 diabetes mellitus without complication, without long-term current use of insulin [facility])   [place], [place], [place]   1 year ago Other acute nonsuppurative otitis media of right ear, recurrence not specified   [place], MD       Future Appointments             In 1 month [name], MD [place]

## 2022-12-17 ENCOUNTER — Other Ambulatory Visit: Payer: Self-pay

## 2022-12-17 ENCOUNTER — Encounter (HOSPITAL_COMMUNITY): Payer: Self-pay | Admitting: *Deleted

## 2022-12-17 ENCOUNTER — Ambulatory Visit (HOSPITAL_COMMUNITY)
Admission: EM | Admit: 2022-12-17 | Discharge: 2022-12-17 | Disposition: A | Discharge status: Home / Self Care | Payer: BLUE CROSS/BLUE SHIELD | Attending: Emergency Medicine | Admitting: Emergency Medicine

## 2022-12-17 DIAGNOSIS — R42 Dizziness and giddiness: Secondary | ICD-10-CM | POA: Diagnosis not present

## 2022-12-17 LAB — POCT URINALYSIS DIPSTICK, ED / UC
Bilirubin Urine: NEGATIVE
Glucose, UA: >=1000 mg/dL — AB
Hgb urine dipstick: NEGATIVE
Leukocytes,Ua: NEGATIVE
Nitrite: NEGATIVE
Protein, ur: NEGATIVE mg/dL
Specific Gravity, Urine: 1.025 (ref 1.005–1.030)
Urobilinogen, UA: 0.2 mg/dL (ref 0.0–1.0)
pH: 5 (ref 5.0–8.0)

## 2022-12-17 LAB — CBG MONITORING, ED: Glucose-Capillary: 225 mg/dL — ABNORMAL HIGH (ref 70–99)

## 2022-12-17 MED ORDER — MECLIZINE HCL 25 MG PO TABS: 25.0000 mg | ORAL_TABLET | Freq: Three times a day (TID) | ORAL | 0 refills | Status: DC | PRN

## 2022-12-17 NOTE — Discharge Instructions (Signed)
Follow up with your regular health care provider if you are not feeling well in 2-3 days. Be careful moving around because of the dizziness.

## 2022-12-17 NOTE — ED Triage Notes (Signed)
Pt reports she has been dizzy since yesterday. Pt reports she ha sfallen due to felling dizzy.

## 2022-12-17 NOTE — ED Triage Notes (Signed)
PT now reports she has had diarrhea.

## 2022-12-21 NOTE — ED Provider Notes (Addendum)
[place]    CSN: [id number] Arrival date & time: 12/17/22  1524      History   Chief Complaint Chief Complaint  Patient presents with   Dizziness   Fall   Diarrhea    HPI Rebecca [name] is a 57 y.o. female.   Pt states yesterday she was dizzy - she describes this as the room is spinning - and when she was at a friend's house and tried to stand up, her legs felt weak and the room was spinning so she sat back down in the chair. She did not fall to the ground and was not injured by this. She has not been sick lately, denies congestion. Did check her blood sugar earlier today and it was 290 even though she hadn't had any thing to eat prior to that. Is taking her DM medicines as prescribed. Still feels room is spining today. Denies abd pain but does report some loose stools today.    Dizziness Associated symptoms: diarrhea   Fall  Diarrhea   Past Medical History:  Diagnosis Date   Arthritis    Breast cancer ([place])    Diabetes mellitus    Family history of lymphoma 01/18/2021   Family history of throat cancer 01/18/2021   Hyperlipidemia    Hypertension     Patient Active Problem List   Diagnosis Date Noted   Chest pain 01/30/2022   Arthralgia of shoulder 12/11/2021   Right otitis media 09/26/2021   Port-A-Cath in place 02/07/2021   Genetic testing 02/02/2021   Family history of throat cancer 01/18/2021   Family history of lymphoma 01/18/2021   Malignant neoplasm of lower-inner quadrant of right breast of female, estrogen receptor positive ([place]) 01/12/2021   Posterior tibial tendinitis, right leg 01/26/2020   Diabetes ([place]) 03/11/2017   Muscle pain 08/06/2013   Tiredness 08/06/2013   Candida vaginitis 08/19/2012   Dental cavities 08/19/2012   Knee pain, bilateral 04/16/2012   Diabetes mellitus type 2, controlled ([place]) 04/16/2012   Hyperlipidemia 04/16/2012   Palpitations 04/16/2012   Back pain 04/16/2012    Past Surgical History:  Procedure  Laterality Date   BREAST LUMPECTOMY WITH RADIOACTIVE SEED AND SENTINEL LYMPH NODE BIOPSY Right 07/11/2021   Procedure: RIGHT BREAST LUMPECTOMY WITH RADIOACTIVE SEED AND RIGHT SENTINEL LYMPH NODE BIOPSY;  Surgeon: Erroll Luna, MD;  Location: [place];  Service: General;  Laterality: Right;   CESAREAN SECTION     PORT-A-CATH REMOVAL Right 05/22/2022   Procedure: REMOVAL PORT-A-CATH;  Surgeon: Erroll Luna, MD;  Location: [place];  Service: General;  Laterality: Right;   PORTACATH PLACEMENT N/A 01/26/2021   Procedure: INSERTION PORT-A-CATH;  Surgeon: Erroll Luna, MD;  Location: [place];  Service: General;  Laterality: N/A;  60   RADIOACTIVE SEED GUIDED AXILLARY SENTINEL LYMPH NODE Right 07/11/2021   Procedure: RADIOACTIVE SEED GUIDED RIGHT AXILLARY LYMPH NODE BIOPSY;  Surgeon: Erroll Luna, MD;  Location: [place];  Service: General;  Laterality: Right;    OB History   No obstetric history on file.      Home Medications    Prior to Admission medications   Medication Sig Start Date End Date Taking? Authorizing Provider  meclizine (ANTIVERT) 25 MG tablet Take 1 tablet (25 mg total) by mouth 3 (three) times daily as needed for dizziness. 12/17/22  Yes Carvel Getting, NP  acetaminophen (TYLENOL) 500 MG tablet Take 1 tablet (500 mg total) by mouth 3 (three) times daily as  needed (with one aleve tablet). 10/03/21   Gardenia Phlegm, NP  amLODipine (NORVASC) 10 MG tablet Take 1 tablet (10 mg total) by mouth daily. 07/11/22   [name], MD  amoxicillin-clavulanate (AUGMENTIN) 875-125 MG tablet Take 1 tablet by mouth every 12 (twelve) hours. Patient not taking: Reported on 10/09/2022 08/20/22   Sherrill Raring, PA-C  Ascorbic Acid (VITAMIN C) 1000 MG tablet Take 1 tablet (1,000 mg total) by mouth daily. 03/07/21   Alla Feeling, NP  azelastine (ASTELIN) 0.1 % nasal spray Place 2 sprays into both nostrils 2 (two) times daily. Use in  each nostril as directed 10/03/21   Gardenia Phlegm, NP  Blood Glucose Monitoring Suppl (ONETOUCH VERIO REFLECT) w/Device KIT 1 kit by Does not apply route in the morning, at noon, and at bedtime. Use as directed to test blood sugar up to three times daily.E11.9 02/02/20   [name], MD  cetirizine (ZYRTEC) 10 MG tablet Take 1 tablet (10 mg total) by mouth daily. 11/21/19   Zigmund Gottron, NP  dapagliflozin propanediol (FARXIGA) 10 MG TABS tablet TAKE 1 TABLET(10 MG) BY MOUTH DAILY WITH BREAKFAST 08/16/22   Freeman Caldron M, PA-C  ergocalciferol (DRISDOL) 1.25 MG (50000 UT) capsule Take 1 capsule (50,000 Units total) by mouth once a week. 07/17/22   [name], MD  fluconazole (DIFLUCAN) 100 MG tablet Take 1 tablet (100 mg total) by mouth daily. Patient not taking: Reported on 10/09/2022 09/26/21   Mayers, Cari S, PA-C  fluticasone (FLONASE) 50 MCG/ACT nasal spray Place 2 sprays into both nostrils daily. 10/12/21   Argentina Donovan, PA-C  gabapentin (NEURONTIN) 300 MG capsule Take 1 capsule (300 mg total) by mouth at bedtime. 10/09/22   [name], MD  glipiZIDE (GLUCOTROL) 10 MG tablet Take 1 tablet (10 mg total) by mouth daily. 08/02/22   [name], MD  glucose blood (ONETOUCH VERIO) test strip Use as directed to test blood sugar up to three times daily. E11.9 02/02/20   [name], MD  labetalol (NORMODYNE) 100 MG tablet Take 100 mg by mouth 2 (two) times daily.    [provider]  Lancets [facility] DELICA PLUS [id number]) MISC 1 Device by Does not apply route in the morning, at noon, and at bedtime. Use as directed to test blood sugar up to three times daily. E11.9 02/02/20   [name], MD  metFORMIN (GLUCOPHAGE) 500 MG tablet TAKE 2 TABLETS(1000 MG) BY MOUTH TWICE DAILY 07/11/22   [name], MD  methocarbamol (ROBAXIN) 500 MG tablet Take 1 tablet (500 mg total) by mouth every 8 (eight) hours as needed for muscle spasms. 10/12/21   Argentina Donovan, PA-C  nystatin (MYCOSTATIN) 100000 UNIT/ML suspension Take 5 mLs (500,000 Units total) by mouth 4 (four) times daily. Patient not taking: Reported on 10/09/2022 04/23/22   Gardenia Phlegm, NP  Omega-3 1000 MG CAPS Take 1 capsule (1,000 mg total) by mouth daily. 03/07/21   Alla Feeling, NP  omeprazole (PRILOSEC) 20 MG capsule Take 1 capsule (20 mg total) by mouth daily. 11/21/19   Zigmund Gottron, NP  rosuvastatin (CRESTOR) 20 MG tablet Take 1 tablet (20 mg total) by mouth daily. 07/17/22   [name], MD  vitamin B-12 (CYANOCOBALAMIN) 100 MCG tablet Take 1 tablet (100 mcg total) by mouth daily. 03/07/21   Alla Feeling, NP    Family History Family History  Problem Relation Age of Onset   Diabetes Mother    Heart disease  Mother    Diabetes Father    Throat cancer Father        d. 77s   Diabetes Sister    Diabetes Brother    Lymphoma Cousin        maternal cousin; d. 69s    Social History Social History   Tobacco Use   Smoking status: Never    Passive exposure: Yes   Smokeless tobacco: Never  Vaping Use   Vaping Use: Never used  Substance Use Topics   Alcohol use: No   Drug use: No     Allergies   Lisinopril and Heparin sod (pork) lock flush   Review of Systems Review of Systems   Physical Exam Triage Vital Signs ED Triage Vitals  Enc Vitals Group     BP 12/17/22 1742 124/86     Pulse Rate 12/17/22 1742 (!) 101     Resp 12/17/22 1742 18     Temp 12/17/22 1742 98 F (36.7 C)     Temp src --      SpO2 12/17/22 1742 97 %     Weight --      Height --      Head Circumference --      Peak Flow --      Pain Score 12/17/22 1739 0     Pain Loc --      Pain Edu? --      Excl. in [place]? --    No data found.  Updated Vital Signs BP 124/86   Pulse (!) 101   Temp 98 F (36.7 C)   Resp 18   SpO2 97%   Visual Acuity Right Eye Distance:   Left Eye Distance:   Bilateral Distance:    Right Eye Near:   Left Eye Near:    Bilateral  Near:     Physical Exam Constitutional:      General: She is not in acute distress.    Appearance: Normal appearance. She is not ill-appearing.  HENT:     Head: Normocephalic and atraumatic.     Right Ear: Tympanic membrane, ear canal and external ear normal.     Left Ear: Tympanic membrane, ear canal and external ear normal.     Nose: No congestion.     Mouth/Throat:     Mouth: Mucous membranes are moist.     Pharynx: Oropharynx is clear.  Eyes:     Extraocular Movements:     Right eye: No nystagmus.     Left eye: No nystagmus.     Comments: No nystagmus either with maintaining position or changing position.   Cardiovascular:     Rate and Rhythm: Normal rate and regular rhythm.  Pulmonary:     Effort: Pulmonary effort is normal.     Breath sounds: Normal breath sounds.  Abdominal:     General: Abdomen is flat. Bowel sounds are normal.     Palpations: Abdomen is soft.     Tenderness: There is no abdominal tenderness. There is no guarding or rebound.  Neurological:     Mental Status: She is alert and oriented to person, place, and time.     Motor: No pronator drift.     Gait: Gait normal.     Comments: No apparent loss of balance with position changes.       UC Treatments / Results  Labs (all labs ordered are listed, but only abnormal results are displayed) Labs Reviewed  CBG MONITORING, ED - Abnormal; Notable for  the following components:      Result Value   Glucose-Capillary 225 (*)    All other components within normal limits  POCT URINALYSIS DIPSTICK, ED / UC - Abnormal; Notable for the following components:   Glucose, UA >=1000 (*)    Ketones, ur TRACE (*)    All other components within normal limits    EKG   Radiology No results found.  Procedures Procedures (including critical care time)  Medications Ordered in UC Medications - No data to display  Initial Impression / Assessment and Plan / UC Course  I have reviewed the triage vital signs and the  nursing notes.  Pertinent labs & imaging results that were available during my care of the patient were reviewed by me and considered in my medical decision making (see chart for details).    Pt does not appear to have vertigo but may have something like labyrinthitis. She appears well and does not seem to be acutely ill. She laughs and jokes during visit. We discussed why blood sugar can be high even without eating anything.   Will try meclizine for her dizziness. She will f/u with pcp if not improving with time. No driving and be careful walking/changing positions.   Final Clinical Impressions(s) / UC Diagnoses   Final diagnoses:  Dizziness     Discharge Instructions      Follow up with your regular health care provider if you are not feeling well in 2-3 days. Be careful moving around because of the dizziness.    ED Prescriptions     Medication Sig Dispense Auth. Provider   meclizine (ANTIVERT) 25 MG tablet Take 1 tablet (25 mg total) by mouth 3 (three) times daily as needed for dizziness. 30 tablet Carvel Getting, NP      PDMP not reviewed this encounter.   Carvel Getting, NP 12/21/22 1152

## 2023-01-02 ENCOUNTER — Other Ambulatory Visit: Payer: Self-pay | Admitting: Family Medicine

## 2023-01-02 DIAGNOSIS — I152 Hypertension secondary to endocrine disorders: Secondary | ICD-10-CM

## 2023-01-02 DIAGNOSIS — E1159 Type 2 diabetes mellitus with other circulatory complications: Secondary | ICD-10-CM

## 2023-01-03 ENCOUNTER — Other Ambulatory Visit: Payer: Self-pay | Admitting: Family Medicine

## 2023-01-03 DIAGNOSIS — E119 Type 2 diabetes mellitus without complications: Secondary | ICD-10-CM

## 2023-01-03 DIAGNOSIS — E1169 Type 2 diabetes mellitus with other specified complication: Secondary | ICD-10-CM

## 2023-01-04 ENCOUNTER — Other Ambulatory Visit: Payer: Self-pay | Admitting: Family Medicine

## 2023-01-04 DIAGNOSIS — E1159 Type 2 diabetes mellitus with other circulatory complications: Secondary | ICD-10-CM

## 2023-01-08 ENCOUNTER — Ambulatory Visit: Payer: Commercial Managed Care - HMO | Admitting: Family Medicine

## 2023-02-05 ENCOUNTER — Inpatient Hospital Stay: Payer: BLUE CROSS/BLUE SHIELD | Attending: Adult Health | Admitting: Adult Health

## 2023-03-25 ENCOUNTER — Ambulatory Visit: Payer: BLUE CROSS/BLUE SHIELD | Attending: Family Medicine | Admitting: Family Medicine

## 2023-03-25 ENCOUNTER — Telehealth: Payer: Self-pay

## 2023-03-25 VITALS — BP 128/81 | HR 97 | Ht 63.0 in | Wt 152.2 lb

## 2023-03-25 DIAGNOSIS — E119 Type 2 diabetes mellitus without complications: Secondary | ICD-10-CM | POA: Diagnosis not present

## 2023-03-25 DIAGNOSIS — Z170 Estrogen receptor positive status [ER+]: Secondary | ICD-10-CM

## 2023-03-25 DIAGNOSIS — M19072 Primary osteoarthritis, left ankle and foot: Secondary | ICD-10-CM

## 2023-03-25 DIAGNOSIS — E1159 Type 2 diabetes mellitus with other circulatory complications: Secondary | ICD-10-CM | POA: Diagnosis not present

## 2023-03-25 DIAGNOSIS — M19071 Primary osteoarthritis, right ankle and foot: Secondary | ICD-10-CM | POA: Diagnosis not present

## 2023-03-25 DIAGNOSIS — I152 Hypertension secondary to endocrine disorders: Secondary | ICD-10-CM

## 2023-03-25 DIAGNOSIS — T451X5A Adverse effect of antineoplastic and immunosuppressive drugs, initial encounter: Secondary | ICD-10-CM

## 2023-03-25 DIAGNOSIS — C50311 Malignant neoplasm of lower-inner quadrant of right female breast: Secondary | ICD-10-CM

## 2023-03-25 DIAGNOSIS — G620 Drug-induced polyneuropathy: Secondary | ICD-10-CM | POA: Diagnosis not present

## 2023-03-25 DIAGNOSIS — E785 Hyperlipidemia, unspecified: Secondary | ICD-10-CM

## 2023-03-25 DIAGNOSIS — E1169 Type 2 diabetes mellitus with other specified complication: Secondary | ICD-10-CM

## 2023-03-25 LAB — POCT GLYCOSYLATED HEMOGLOBIN (HGB A1C): HbA1c, POC (controlled diabetic range): 9.1 % — AB (ref 0.0–7.0)

## 2023-03-25 MED ORDER — OZEMPIC (0.25 OR 0.5 MG/DOSE) 2 MG/1.5ML ~~LOC~~ SOPN: 0.2500 mg | PEN_INJECTOR | SUBCUTANEOUS | 6 refills | Status: DC

## 2023-03-25 MED ORDER — GLIPIZIDE 10 MG PO TABS: 10.0000 mg | ORAL_TABLET | Freq: Every day | ORAL | 1 refills | Status: DC

## 2023-03-25 MED ORDER — AMLODIPINE BESYLATE 10 MG PO TABS: 10.0000 mg | ORAL_TABLET | Freq: Every day | ORAL | 1 refills | Status: DC

## 2023-03-25 MED ORDER — GABAPENTIN 300 MG PO CAPS: 300.0000 mg | ORAL_CAPSULE | Freq: Every day | ORAL | 1 refills | Status: DC

## 2023-03-25 MED ORDER — DAPAGLIFLOZIN PROPANEDIOL 10 MG PO TABS: ORAL_TABLET | ORAL | 1 refills | Status: DC

## 2023-03-25 MED ORDER — OMEPRAZOLE 20 MG PO CPDR: 20.0000 mg | DELAYED_RELEASE_CAPSULE | Freq: Every day | ORAL | 1 refills | Status: DC

## 2023-03-25 MED ORDER — ROSUVASTATIN CALCIUM 20 MG PO TABS: 20.0000 mg | ORAL_TABLET | Freq: Every day | ORAL | 1 refills | Status: DC

## 2023-03-25 MED ORDER — NAPROXEN 500 MG PO TABS: 500.0000 mg | ORAL_TABLET | Freq: Two times a day (BID) | ORAL | 1 refills | Status: AC

## 2023-03-25 MED ORDER — CHLORTHALIDONE 25 MG PO TABS: 25.0000 mg | ORAL_TABLET | Freq: Every day | ORAL | 1 refills | Status: DC

## 2023-03-25 MED ORDER — METFORMIN HCL 500 MG PO TABS: 1000.0000 mg | ORAL_TABLET | Freq: Two times a day (BID) | ORAL | 1 refills | Status: DC

## 2023-03-25 NOTE — Progress Notes (Signed)
Pain in right big toe.

## 2023-03-25 NOTE — Patient Instructions (Addendum)
Please call to schedule your appointment:  Placed in [facility] [address] [place], [place] [address] PH# [phone]

## 2023-03-25 NOTE — Telephone Encounter (Signed)
At the request of Dr Alvis Lemmings, I met with the patient when she was in the clinic today. Arabic interpreter: [id number]/[employer] assisted.   The patient explained that all of her providers, especially PCP and oncologist are out of network with her [employer] plan  She said she purchased the plan from a private insurance agent, it is not through an employer.  She went on to say that the other option for insurance was Rosann Auerbach but that was too expensive.  She works at [employer] and her income is about $935 every 2 weeks. She said she is over income for Medicaid but I explained to her that we have DSS Medicaid eligibility caseworkers on the 4th floor of our building and I can take her to meet them if she is interested, They can check to see if there is any chance she would be eligible.  She was interested and I was able to take her to the [facility] office when when completed her appointment with Dr Alvis Lemmings.  I also offered to make a referral to [employer] [facility]) for possible assistance with accessing the  [employer] to see if there are any other insurance options that are available with [facility] in network. I said that there is no guarantee that they will be able to help; but we can try.  She was in agreement to placing that referral and I sent it to [facility] via Epic.

## 2023-03-25 NOTE — Progress Notes (Signed)
Subjective:  Patient ID: Rebecca [name], female    DOB: 1966-03-20  Age: 57 y.o. MRN: [id number]  CC: Diabetes   HPI Rebecca [name] is a 57 y.o. year old female with a history of type 2 diabetes mellitus (A1c 9.1), hyperlipidemia, hypertension, stage IIIB R breast ca ( cT2, CN1, cM0 triple neg clinically but 10%ER+, status post lumpectomy, neoadjuvant chemotherapy, adjuvant radiation.)   Interval History:  She has had difficulty with her insurance as she was informed her insurance would not be accepted at [facility] but she would like to keep me as her PCP and keep all her specialist with [facility]. She has noticed slight edema of her right upper extremity. Review of her chart indicates she needed to follow-up with the cancer center 6 months from 07/2022 but is yet to do so.  She is also due for mammogram.   A1c is 9.1 up from 7.8 previously. She endorses eating a lot at her job which is stressful.  She endorses adherence with her medications.  She denies presence of visual symptoms or hypoglycemia.  Neuropathy is stable on her current regimen.  She Complains of pain in her right medial MTP. Xray foot ordered by podiatry shows no fracture but presence of OA, medial sided spurring of first MTP, flatfoot.  She stands on her feet all day at work at [employer]. Past Medical History:  Diagnosis Date   Arthritis    Breast cancer (HCC)    Diabetes mellitus    Family history of lymphoma 01/18/2021   Family history of throat cancer 01/18/2021   Hyperlipidemia    Hypertension     Past Surgical History:  Procedure Laterality Date   BREAST LUMPECTOMY WITH RADIOACTIVE SEED AND SENTINEL LYMPH NODE BIOPSY Right 07/11/2021   Procedure: RIGHT BREAST LUMPECTOMY WITH RADIOACTIVE SEED AND RIGHT SENTINEL LYMPH NODE BIOPSY;  Surgeon: Harriette Bouillon, MD;  Location: [facility];  Service: General;  Laterality: Right;   CESAREAN SECTION     PORT-A-CATH REMOVAL Right 05/22/2022    Procedure: REMOVAL PORT-A-CATH;  Surgeon: Harriette Bouillon, MD;  Location: [facility];  Service: General;  Laterality: Right;   PORTACATH PLACEMENT N/A 01/26/2021   Procedure: INSERTION PORT-A-CATH;  Surgeon: Harriette Bouillon, MD;  Location: [facility];  Service: General;  Laterality: N/A;  60   RADIOACTIVE SEED GUIDED AXILLARY SENTINEL LYMPH NODE Right 07/11/2021   Procedure: RADIOACTIVE SEED GUIDED RIGHT AXILLARY LYMPH NODE BIOPSY;  Surgeon: Harriette Bouillon, MD;  Location: [facility];  Service: General;  Laterality: Right;    Family History  Problem Relation Age of Onset   Diabetes Mother    Heart disease Mother    Diabetes Father    Throat cancer Father        d. 87s   Diabetes Sister    Diabetes Brother    Lymphoma Cousin        maternal cousin; d. 30s    Social History   Socioeconomic History   Marital status: Divorced    Spouse name: Not on file   Number of children: Not on file   Years of education: Not on file   Highest education level: Not on file  Occupational History   Not on file  Tobacco Use   Smoking status: Never    Passive exposure: Yes   Smokeless tobacco: Never  Vaping Use   Vaping Use: Never used  Substance and Sexual Activity   Alcohol use: No   Drug use:  No   Sexual activity: Not Currently  Other Topics Concern   Not on file  Social History Narrative   Not on file   Social Determinants of Health   Financial Resource Strain: Not on file  Food Insecurity: Not on file  Transportation Needs: No Transportation Needs (01/18/2021)   PRAPARE - Transportation    Lack of Transportation (Medical): No    Lack of Transportation (Non-Medical): No  Physical Activity: Not on file  Stress: Not on file  Social Connections: Not on file    Allergies  Allergen Reactions   Lisinopril Swelling    Swelling in throat, coughing, difficulty swallowing, could not sleep. Occurred Dec 2020.   Heparin Sod Chief [employer]) Lock Flush Other (See Comments)     Pork derived product    Outpatient Medications Prior to Visit  Medication Sig Dispense Refill   acetaminophen (TYLENOL) 500 MG tablet Take 1 tablet (500 mg total) by mouth 3 (three) times daily as needed (with one aleve tablet). 30 tablet 0   amoxicillin-clavulanate (AUGMENTIN) 875-125 MG tablet Take 1 tablet by mouth every 12 (twelve) hours. (Patient not taking: Reported on 10/09/2022) 14 tablet 0   Ascorbic Acid (VITAMIN C) 1000 MG tablet Take 1 tablet (1,000 mg total) by mouth daily. 30 tablet 0   azelastine (ASTELIN) 0.1 % nasal spray Place 2 sprays into both nostrils 2 (two) times daily. Use in each nostril as directed 30 mL 12   Blood Glucose Monitoring Suppl (ONETOUCH VERIO REFLECT) w/Device KIT 1 kit by Does not apply route in the morning, at noon, and at bedtime. Use as directed to test blood sugar up to three times daily.E11.9 1 kit 0   cetirizine (ZYRTEC) 10 MG tablet Take 1 tablet (10 mg total) by mouth daily. 30 tablet 0   ergocalciferol (DRISDOL) 1.25 MG (50000 UT) capsule Take 1 capsule (50,000 Units total) by mouth once a week. 12 capsule 1   fluconazole (DIFLUCAN) 100 MG tablet Take 1 tablet (100 mg total) by mouth daily. (Patient not taking: Reported on 10/09/2022) 10 tablet 0   fluticasone (FLONASE) 50 MCG/ACT nasal spray Place 2 sprays into both nostrils daily. 15.8 mL 2   glucose blood (ONETOUCH VERIO) test strip Use as directed to test blood sugar up to three times daily. E11.9 100 each 6   labetalol (NORMODYNE) 100 MG tablet Take 100 mg by mouth 2 (two) times daily.     Lancets (ONETOUCH DELICA PLUS LANCET33G) MISC 1 Device by Does not apply route in the morning, at noon, and at bedtime. Use as directed to test blood sugar up to three times daily. E11.9 100 each 6   meclizine (ANTIVERT) 25 MG tablet Take 1 tablet (25 mg total) by mouth 3 (three) times daily as needed for dizziness. 30 tablet 0   methocarbamol (ROBAXIN) 500 MG tablet Take 1 tablet (500 mg total) by mouth  every 8 (eight) hours as needed for muscle spasms. 90 tablet 0   nystatin (MYCOSTATIN) 100000 UNIT/ML suspension Take 5 mLs (500,000 Units total) by mouth 4 (four) times daily. (Patient not taking: Reported on 10/09/2022) 60 mL 0   Omega-3 1000 MG CAPS Take 1 capsule (1,000 mg total) by mouth daily. 30 capsule 0   vitamin B-12 (CYANOCOBALAMIN) 100 MCG tablet Take 1 tablet (100 mcg total) by mouth daily. 30 tablet 0   amLODipine (NORVASC) 10 MG tablet TAKE 1 TABLET(10 MG) BY MOUTH DAILY 90 tablet 1   chlorthalidone (HYGROTON) 25 MG tablet TAKE 1  TABLET(25 MG) BY MOUTH DAILY 90 tablet 1   dapagliflozin propanediol (FARXIGA) 10 MG TABS tablet TAKE 1 TABLET(10 MG) BY MOUTH DAILY WITH BREAKFAST 90 tablet 1   gabapentin (NEURONTIN) 300 MG capsule Take 1 capsule (300 mg total) by mouth at bedtime. 90 capsule 4   glipiZIDE (GLUCOTROL) 10 MG tablet Take 1 tablet (10 mg total) by mouth daily. 90 tablet 1   metFORMIN (GLUCOPHAGE) 500 MG tablet TAKE 2 TABLETS(1000 MG) BY MOUTH TWICE DAILY 120 tablet 0   omeprazole (PRILOSEC) 20 MG capsule Take 1 capsule (20 mg total) by mouth daily. 30 capsule 0   rosuvastatin (CRESTOR) 20 MG tablet TAKE 1 TABLET(20 MG) BY MOUTH DAILY 90 tablet 1   No facility-administered medications prior to visit.     ROS Review of Systems  Constitutional:  Negative for activity change and appetite change.  HENT:  Negative for sinus pressure and sore throat.   Respiratory:  Negative for chest tightness, shortness of breath and wheezing.   Cardiovascular:  Negative for chest pain and palpitations.  Gastrointestinal:  Negative for abdominal distention, abdominal pain and constipation.  Genitourinary: Negative.   Musculoskeletal:        See HPI  Psychiatric/Behavioral:  Negative for behavioral problems and dysphoric mood.     Objective:  BP 128/81   Pulse 97   Ht 5\' 3"  (1.6 m)   Wt 152 lb 3.2 oz (69 kg)   SpO2 97%   BMI 26.96 kg/m      03/25/2023    3:35 PM 12/17/2022     5:42 PM 10/09/2022   11:01 AM  BP/Weight  Systolic BP 128 124 123  Diastolic BP 81 86 77  Wt. (Lbs) 152.2  158.4  BMI 26.96 kg/m2  28.06 kg/m2      Physical Exam Constitutional:      Appearance: She is well-developed.  Cardiovascular:     Rate and Rhythm: Normal rate.     Heart sounds: Normal heart sounds. No murmur heard. Pulmonary:     Effort: Pulmonary effort is normal.     Breath sounds: Normal breath sounds. No wheezing or rales.  Chest:     Chest wall: No tenderness.  Abdominal:     General: Bowel sounds are normal. There is no distension.     Palpations: Abdomen is soft. There is no mass.     Tenderness: There is no abdominal tenderness.  Musculoskeletal:        General: Normal range of motion.     Right lower leg: No edema.     Left lower leg: No edema.     Comments: Bunion more prominent and left great toe Slight tenderness on palpation of right first MTP joint  Neurological:     Mental Status: She is alert and oriented to person, place, and time.  Psychiatric:        Mood and Affect: Mood normal.        Latest Ref Rng & Units 08/20/2022    2:04 AM 08/06/2022    1:16 PM 07/16/2022    8:45 AM  CMP  Glucose 70 - 99 mg/dL 098  119  147   BUN 6 - 20 mg/dL 24  20  19    Creatinine 0.44 - 1.00 mg/dL 8.29  5.62  1.30   Sodium 135 - 145 mmol/L 140  141  136   Potassium 3.5 - 5.1 mmol/L 4.0  4.4  3.2   Chloride 98 - 111 mmol/L 104  106  88   CO2 22 - 32 mmol/L 24  29  26    Calcium 8.9 - 10.3 mg/dL 9.7  9.6  16.1   Total Protein 6.5 - 8.1 g/dL 7.2  7.4  8.0   Total Bilirubin 0.3 - 1.2 mg/dL 0.6  0.3  0.5   Alkaline Phos 38 - 126 U/L 67  71  105   AST 15 - 41 U/L 22  19  43   ALT 0 - 44 U/L 19  22  40     Lipid Panel     Component Value Date/Time   CHOL 185 10/10/2022 1520   TRIG 250 (H) 10/10/2022 1520   HDL 51 10/10/2022 1520   CHOLHDL 2.5 11/11/2020 0908   CHOLHDL 5.5 (H) 11/02/2016 1102   VLDL 80 (H) 11/02/2016 1102   LDLCALC 92 10/10/2022 1520     CBC    Component Value Date/Time   WBC 9.4 08/20/2022 0204   RBC 4.86 08/20/2022 0204   HGB 13.0 08/20/2022 0204   HGB 13.0 08/06/2022 1316   HCT 39.4 08/20/2022 0204   PLT 242 08/20/2022 0204   PLT 237 08/06/2022 1316   MCV 81.1 08/20/2022 0204   MCH 26.7 08/20/2022 0204   MCHC 33.0 08/20/2022 0204   RDW 14.1 08/20/2022 0204   LYMPHSABS 0.9 08/20/2022 0204   MONOABS 0.7 08/20/2022 0204   EOSABS 0.2 08/20/2022 0204   BASOSABS 0.0 08/20/2022 0204    Lab Results  Component Value Date   HGBA1C 9.1 (A) 03/25/2023    Assessment & Plan:  1. Type 2 diabetes mellitus without complication, without long-term current use of insulin (HCC) Uncontrolled with A1c of 9.1; goal is less than 7.0 GLP-1 RA initiated She will be working on reducing her snacking Counseled on Diabetic diet, my plate method, 096 minutes of moderate intensity exercise/week Blood sugar logs with fasting goals of 80-120 mg/dl, random of less than 045 and in the event of sugars less than 60 mg/dl or greater than 409 mg/dl encouraged to notify the clinic. Advised on the need for annual eye exams, annual foot exams, Pneumonia vaccine. - POCT glycosylated hemoglobin (Hb A1C) - Semaglutide,0.25 or 0.5MG /DOS, (OZEMPIC, 0.25 OR 0.5 MG/DOSE,) 2 MG/1.5ML SOPN; Inject 0.25 mg into the skin once a week.  Dispense: 2 mL; Refill: 6 - dapagliflozin propanediol (FARXIGA) 10 MG TABS tablet; TAKE 1 TABLET(10 MG) BY MOUTH DAILY WITH BREAKFAST  Dispense: 90 tablet; Refill: 1 - glipiZIDE (GLUCOTROL) 10 MG tablet; Take 1 tablet (10 mg total) by mouth daily.  Dispense: 90 tablet; Refill: 1 - metFORMIN (GLUCOPHAGE) 500 MG tablet; Take 2 tablets (1,000 mg total) by mouth 2 (two) times daily with a meal.  Dispense: 360 tablet; Refill: 1 - AMB Referral to Pharmacy Medication Management  2. 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  Dispense: 60 tablet; Refill: 1  3. Hypertension associated with diabetes (HCC) Controlled Continue current regimen Counseled on blood pressure goal of less than 130/80, low-sodium, DASH diet, medication compliance, 150 minutes of moderate intensity exercise per week. Discussed medication compliance, adverse effects. - amLODipine (NORVASC) 10 MG tablet; Take 1 tablet (10 mg total) by mouth daily.  Dispense: 90 tablet; Refill: 1 - chlorthalidone (HYGROTON) 25 MG tablet; Take 1  tablet (25 mg total) by mouth daily.  Dispense: 90 tablet; Refill: 1  4. Peripheral neuropathy due to chemotherapy (HCC) - gabapentin (NEURONTIN) 300 MG capsule; Take 1 capsule (300 mg total) by mouth at bedtime.  Dispense: 90 capsule; Refill: 1  5. Hyperlipidemia associated with type 2 diabetes mellitus (HCC) Stable - rosuvastatin (CRESTOR) 20 MG tablet; Take 1 tablet (20 mg total) by mouth daily.  Dispense: 90 tablet; Refill: 1  6. Malignant neoplasm of lower-inner quadrant of right breast of female, estrogen receptor positive (HCC) stage IIIB R breast ca ( cT2, CN1, cM0 triple neg clinically but 10%ER+, status post lumpectomy, neoadjuvant chemotherapy, adjuvant radiation) Is in the breast cancer survivorship program She is due for mammogram I have sent a message to Lillard Anes with the breast cancer survivorship program to assist her in securing an appointment I have had the case manager meet with her to discuss insurance options given the challenges she is experiencing  She declines labs today since this clinic is out of network for her.     Meds ordered this encounter  Medications   Semaglutide,0.25 or 0.5MG /DOS, (OZEMPIC, 0.25 OR 0.5 MG/DOSE,) 2 MG/1.5ML SOPN    Sig: Inject 0.25 mg into the skin once a week.    Dispense:  2 mL    Refill:  6   naproxen  (NAPROSYN) 500 MG tablet    Sig: Take 1 tablet (500 mg total) by mouth 2 (two) times daily with a meal.    Dispense:  60 tablet    Refill:  1   amLODipine (NORVASC) 10 MG tablet    Sig: Take 1 tablet (10 mg total) by mouth daily.    Dispense:  90 tablet    Refill:  1   chlorthalidone (HYGROTON) 25 MG tablet    Sig: Take 1 tablet (25 mg total) by mouth daily.    Dispense:  90 tablet    Refill:  1   dapagliflozin propanediol (FARXIGA) 10 MG TABS tablet    Sig: TAKE 1 TABLET(10 MG) BY MOUTH DAILY WITH BREAKFAST    Dispense:  90 tablet    Refill:  1   gabapentin (NEURONTIN) 300 MG capsule    Sig: Take 1 capsule (300 mg total) by mouth at bedtime.    Dispense:  90 capsule    Refill:  1   glipiZIDE (GLUCOTROL) 10 MG tablet    Sig: Take 1 tablet (10 mg total) by mouth daily.    Dispense:  90 tablet    Refill:  1    Dose increase   metFORMIN (GLUCOPHAGE) 500 MG tablet    Sig: Take 2 tablets (1,000 mg total) by mouth 2 (two) times daily with a meal.    Dispense:  360 tablet    Refill:  1   omeprazole (PRILOSEC) 20 MG capsule    Sig: Take 1 capsule (20 mg total) by mouth daily.    Dispense:  90 capsule    Refill:  1   rosuvastatin (CRESTOR) 20 MG tablet    Sig: Take 1 tablet (20 mg total) by mouth daily.    Dispense:  90 tablet    Refill:  1    **Patient requests 90 days supply**    Follow-up: Return in about 3 months (around 06/25/2023) for Chronic medical conditions.       [name], MD, FAAFP. [facility] [place], [place] [phone]   03/25/2023, 6:00 PM

## 2023-03-26 ENCOUNTER — Telehealth: Payer: Self-pay

## 2023-03-26 NOTE — Telephone Encounter (Signed)
Called Pt using [employer] with below message. Pt scheduled for 04/15/23 (Pt requested an appt after 3pm on a Monday). Pt verbalized understanding.

## 2023-03-26 NOTE — Telephone Encounter (Signed)
-----   Message from Loa Socks, NP sent at 03/26/2023  9:39 AM EDT ----- Regarding: RE: Lost to follow up Hey Eliza--patient did not show for her 4/16 appt, can you please reach out and get her rescheduled.    Thanks, LC ----- Message ----- From: [name], MD Sent: 03/25/2023   5:50 PM EDT To: Loa Socks, NP Subject: Lost to follow up                              Hello, This patient is unsure if she has a follow-up and is due for a mammogram.  Can you please get her into the cancer center? Thanks, Dr. Alvis Lemmings.

## 2023-03-27 ENCOUNTER — Telehealth: Payer: Self-pay | Admitting: Pharmacist

## 2023-03-27 NOTE — Telephone Encounter (Signed)
LIBERATE Study  Received referral for patient participation in the LIBERATE CGM Study. Contacted patient to discuss study and confirmed HIPAA identifiers. Confirmed patient was provided the LIBERATE Study Information Sheet and any questions were answered.   Confirmed that patient meets study criteria by having a diagnosis of Type 2 Diabetes, is not currently on insulin, and most recent A1c is >8%.  Patient provided verbal consent to participate in the study. Consent documented in electronic medical record.   - Confirmed that patient has a compatible smart phone to download [employer] 3 app. ([email or web address]) - Asked to download and create a [facility] account prior to first study visit.  - Scheduled first study visit on 04/12/2023. Confirmed patient has transportation to this appointment.  - Discussed use of MyChart in this study. Confirmed patient has an active MyChart account and is aware of their log in information.  Patient aware of pre-visit questionnaire that will be sent 2 days prior to their scheduled study visit and they will plan to complete before the visit.   Butch Penny, PharmD, P[place]y [place], CPP Clinical Pharmacist [employer] [facility] [phone]

## 2023-03-28 ENCOUNTER — Other Ambulatory Visit: Payer: Self-pay | Admitting: Family Medicine

## 2023-03-28 DIAGNOSIS — E119 Type 2 diabetes mellitus without complications: Secondary | ICD-10-CM

## 2023-03-28 NOTE — Telephone Encounter (Signed)
Unable to refill per protocol, Rx request is too soon. Last refill 03/25/23 for 90 days.  Requested Prescriptions  Pending Prescriptions Disp Refills   FARXIGA 10 MG TABS tablet [Pharmacy Med Name: FARXIGA 10MG  TABLETS] 90 tablet 1    Sig: TAKE 1 TABLET(10 MG) BY MOUTH DAILY WITH BREAKFAST     Endocrinology:  Diabetes - SGLT2 Inhibitors Failed - 03/28/2023  3:51 AM      Failed - HBA1C is between 0 and 7.9 and within 180 days    HbA1c, POC (controlled diabetic range)  Date Value Ref Range Status  03/25/2023 9.1 (A) 0.0 - 7.0 % Final         Passed - Cr in normal range and within 360 days    Creatinine  Date Value Ref Range Status  08/06/2022 0.73 0.44 - 1.00 mg/dL Final   Creat  Date Value Ref Range Status  11/02/2016 0.50 0.50 - 1.10 mg/dL Final   Creatinine, Ser  Date Value Ref Range Status  08/20/2022 0.68 0.44 - 1.00 mg/dL Final   Creatinine, Urine  Date Value Ref Range Status  11/02/2016 141 20 - 320 mg/dL Final         Passed - eGFR in normal range and within 360 days    GFR, Est African American  Date Value Ref Range Status  01/16/2016 >89 >=60 mL/min Final   GFR calc Af Amer  Date Value Ref Range Status  06/02/2020 118 >59 mL/min/1.73 Final    Comment:    **[facility] currently reports eGFR in compliance with the current**   recommendations of the [employer]. [facility] will   update reporting as new guidelines are published from the NKF-ASN   Task force.    GFR, Est Non African American  Date Value Ref Range Status  01/16/2016 >89 >=60 mL/min Final    Comment:      The estimated GFR is a calculation valid for adults (>=76 years old) that uses the CKD-EPI algorithm to adjust for age and sex. It is   not to be used for children, pregnant women, hospitalized patients,    patients on dialysis, or with rapidly changing kidney function. According to the NKDEP, eGFR >89 is normal, 60-89 shows mild impairment, 30-59 shows moderate impairment, 15-29  shows severe impairment and <15 is ESRD.      GFR, Estimated  Date Value Ref Range Status  08/20/2022 >60 >60 mL/min Final    Comment:    (NOTE) Calculated using the CKD-EPI Creatinine Equation (2021)   08/06/2022 >60 >60 mL/min Final    Comment:    (NOTE) Calculated using the CKD-EPI Creatinine Equation (2021)    eGFR  Date Value Ref Range Status  07/16/2022 106 >59 mL/min/1.73 Final         Passed - Valid encounter within last 6 months    Recent Outpatient Visits           3 days ago Type 2 diabetes mellitus without complication, without long-term current use of insulin (HCC)   [facility] [place] [place], [place], [place]   5 months ago Type 2 diabetes mellitus without complication, without long-term current use of insulin (HCC)   [facility] [place], [place], [place]   7 months ago Type 2 diabetes mellitus without complication, without long-term current use of insulin [facility])   [facility] [place], [place], [place]   8 months ago Type 2 diabetes mellitus  without complication, without long-term current use of insulin [facility])   [facility] [place], [place], [place]   1 year ago Type 2 diabetes mellitus without complication, without long-term current use of insulin [facility])   [facility] [place], [place], [place]

## 2023-03-29 ENCOUNTER — Encounter (HOSPITAL_COMMUNITY): Payer: Self-pay | Admitting: Emergency Medicine

## 2023-03-29 ENCOUNTER — Ambulatory Visit (HOSPITAL_COMMUNITY)
Admission: EM | Admit: 2023-03-29 | Discharge: 2023-03-29 | Disposition: A | Discharge status: Home / Self Care | Payer: BLUE CROSS/BLUE SHIELD | Attending: Internal Medicine | Admitting: Internal Medicine

## 2023-03-29 ENCOUNTER — Ambulatory Visit (INDEPENDENT_AMBULATORY_CARE_PROVIDER_SITE_OTHER): Payer: BLUE CROSS/BLUE SHIELD

## 2023-03-29 DIAGNOSIS — W19XXXA Unspecified fall, initial encounter: Secondary | ICD-10-CM

## 2023-03-29 DIAGNOSIS — M25521 Pain in right elbow: Secondary | ICD-10-CM

## 2023-03-29 MED ORDER — IBUPROFEN 600 MG PO TABS: 600.0000 mg | ORAL_TABLET | Freq: Four times a day (QID) | ORAL | 0 refills | Status: DC | PRN

## 2023-03-29 NOTE — ED Triage Notes (Signed)
Reports slipped in new bath tub this morning and hurt right elbow. Took ibuporfen twice.

## 2023-03-29 NOTE — ED Provider Notes (Signed)
[facility]    CSN: [id number] Arrival date & time: 03/29/23  1546      History   Chief Complaint Chief Complaint  Patient presents with   Fall    HPI Rebecca [name] is a 57 y.o. female.   Patient presents to urgent care for evaluation of pain to the right lateral elbow as a result of a fall that happened this morning while she was taking a shower in the bathtub.  She states that she accidentally slipped, fell, and landed on her buttocks striking her right arm on the tub wall at the elbow joint.  Denies numbness and tingling distally to injury.  She was able to stand up without assistance immediately and did not hit her head/pass out during the fall.  She does not take blood thinners.  Denies dizziness, headache, vision changes, and arm weakness.  States she is very sore but has taken ibuprofen with some relief of the pain before coming to urgent care.  No laceration/abrasion.  No pain to the right wrist, right shoulder, or right hand.   Fall    Past Medical History:  Diagnosis Date   Arthritis    Breast cancer (HCC)    Diabetes mellitus    Family history of lymphoma 01/18/2021   Family history of throat cancer 01/18/2021   Hyperlipidemia    Hypertension     Patient Active Problem List   Diagnosis Date Noted   Chest pain 01/30/2022   Arthralgia of shoulder 12/11/2021   Right otitis media 09/26/2021   Port-A-Cath in place 02/07/2021   Genetic testing 02/02/2021   Family history of throat cancer 01/18/2021   Family history of lymphoma 01/18/2021   Malignant neoplasm of lower-inner quadrant of right breast of female, estrogen receptor positive (HCC) 01/12/2021   Posterior tibial tendinitis, right leg 01/26/2020   Diabetes (HCC) 03/11/2017   Muscle pain 08/06/2013   Tiredness 08/06/2013   Candida vaginitis 08/19/2012   Dental cavities 08/19/2012   Knee pain, bilateral 04/16/2012   Diabetes mellitus type 2, controlled (HCC) 04/16/2012   Hyperlipidemia  04/16/2012   Palpitations 04/16/2012   Back pain 04/16/2012    Past Surgical History:  Procedure Laterality Date   BREAST LUMPECTOMY WITH RADIOACTIVE SEED AND SENTINEL LYMPH NODE BIOPSY Right 07/11/2021   Procedure: RIGHT BREAST LUMPECTOMY WITH RADIOACTIVE SEED AND RIGHT SENTINEL LYMPH NODE BIOPSY;  Surgeon: Harriette Bouillon, MD;  Location: [facility];  Service: General;  Laterality: Right;   CESAREAN SECTION     PORT-A-CATH REMOVAL Right 05/22/2022   Procedure: REMOVAL PORT-A-CATH;  Surgeon: Harriette Bouillon, MD;  Location: [facility];  Service: General;  Laterality: Right;   PORTACATH PLACEMENT N/A 01/26/2021   Procedure: INSERTION PORT-A-CATH;  Surgeon: Harriette Bouillon, MD;  Location: [facility];  Service: General;  Laterality: N/A;  60   RADIOACTIVE SEED GUIDED AXILLARY SENTINEL LYMPH NODE Right 07/11/2021   Procedure: RADIOACTIVE SEED GUIDED RIGHT AXILLARY LYMPH NODE BIOPSY;  Surgeon: Harriette Bouillon, MD;  Location: [facility];  Service: General;  Laterality: Right;    OB History   No obstetric history on file.      Home Medications    Prior to Admission medications   Medication Sig Start Date End Date Taking? Authorizing Provider  acetaminophen (TYLENOL) 500 MG tablet Take 1 tablet (500 mg total) by mouth 3 (three) times daily as needed (with one aleve tablet). 10/03/21   Loa Socks, NP  amLODipine (NORVASC) 10 MG tablet  Take 1 tablet (10 mg total) by mouth daily. 03/25/23   [name], MD  amoxicillin-clavulanate (AUGMENTIN) 875-125 MG tablet Take 1 tablet by mouth every 12 (twelve) hours. Patient not taking: Reported on 10/09/2022 08/20/22   Theron Arista, PA-C  Ascorbic Acid (VITAMIN C) 1000 MG tablet Take 1 tablet (1,000 mg total) by mouth daily. 03/07/21   Pollyann Samples, NP  azelastine (ASTELIN) 0.1 % nasal spray Place 2 sprays into both nostrils 2 (two) times daily. Use in each nostril as directed 10/03/21   Loa Socks, NP  Blood Glucose Monitoring Suppl (ONETOUCH VERIO REFLECT) w/Device KIT 1 kit by Does not apply route in the morning, at noon, and at bedtime. Use as directed to test blood sugar up to three times daily.E11.9 02/02/20   [name], MD  cetirizine (ZYRTEC) 10 MG tablet Take 1 tablet (10 mg total) by mouth daily. 11/21/19   Georgetta Haber, NP  chlorthalidone (HYGROTON) 25 MG tablet Take 1 tablet (25 mg total) by mouth daily. 03/25/23   [name], MD  dapagliflozin propanediol (FARXIGA) 10 MG TABS tablet TAKE 1 TABLET(10 MG) BY MOUTH DAILY WITH BREAKFAST 03/25/23   [name], MD  ergocalciferol (DRISDOL) 1.25 MG (50000 UT) capsule Take 1 capsule (50,000 Units total) by mouth once a week. 07/17/22   [name], MD  fluconazole (DIFLUCAN) 100 MG tablet Take 1 tablet (100 mg total) by mouth daily. Patient not taking: Reported on 10/09/2022 09/26/21   Mayers, Cari S, PA-C  fluticasone (FLONASE) 50 MCG/ACT nasal spray Place 2 sprays into both nostrils daily. 10/12/21   Anders Simmonds, PA-C  gabapentin (NEURONTIN) 300 MG capsule Take 1 capsule (300 mg total) by mouth at bedtime. 03/25/23   [name], MD  glipiZIDE (GLUCOTROL) 10 MG tablet Take 1 tablet (10 mg total) by mouth daily. 03/25/23   [name], MD  glucose blood (ONETOUCH VERIO) test strip Use as directed to test blood sugar up to three times daily. E11.9 02/02/20   [name], MD  labetalol (NORMODYNE) 100 MG tablet Take 100 mg by mouth 2 (two) times daily.    [provider]  Lancets [facility] DELICA PLUS [id number]) MISC 1 Device by Does not apply route in the morning, at noon, and at bedtime. Use as directed to test blood sugar up to three times daily. E11.9 02/02/20   [name], MD  meclizine (ANTIVERT) 25 MG tablet Take 1 tablet (25 mg total) by mouth 3 (three) times daily as needed for dizziness. 12/17/22   Cathlyn Parsons, NP  metFORMIN (GLUCOPHAGE) 500 MG tablet Take 2 tablets (1,000 mg  total) by mouth 2 (two) times daily with a meal. 03/25/23   [name], MD  methocarbamol (ROBAXIN) 500 MG tablet Take 1 tablet (500 mg total) by mouth every 8 (eight) hours as needed for muscle spasms. 10/12/21   Anders Simmonds, PA-C  naproxen (NAPROSYN) 500 MG tablet Take 1 tablet (500 mg total) by mouth 2 (two) times daily with a meal. 03/25/23   [name], MD  nystatin (MYCOSTATIN) 100000 UNIT/ML suspension Take 5 mLs (500,000 Units total) by mouth 4 (four) times daily. Patient not taking: Reported on 10/09/2022 04/23/22   Loa Socks, NP  Omega-3 1000 MG CAPS Take 1 capsule (1,000 mg total) by mouth daily. 03/07/21   Pollyann Samples, NP  omeprazole (PRILOSEC) 20 MG capsule Take 1 capsule (20 mg total) by mouth daily. 03/25/23   [name], MD  rosuvastatin (  CRESTOR) 20 MG tablet Take 1 tablet (20 mg total) by mouth daily. 03/25/23   [name], MD  Semaglutide,0.25 or 0.5MG /DOS, (OZEMPIC, 0.25 OR 0.5 MG/DOSE,) 2 MG/1.5ML SOPN Inject 0.25 mg into the skin once a week. 03/25/23   [name], MD  vitamin B-12 (CYANOCOBALAMIN) 100 MCG tablet Take 1 tablet (100 mcg total) by mouth daily. 03/07/21   Pollyann Samples, NP    Family History Family History  Problem Relation Age of Onset   Diabetes Mother    Heart disease Mother    Diabetes Father    Throat cancer Father        d. 66s   Diabetes Sister    Diabetes Brother    Lymphoma Cousin        maternal cousin; d. 35s    Social History Social History   Tobacco Use   Smoking status: Never    Passive exposure: Yes   Smokeless tobacco: Never  Vaping Use   Vaping Use: Never used  Substance Use Topics   Alcohol use: No   Drug use: No     Allergies   Lisinopril and Heparin sod (pork) lock flush   Review of Systems Review of Systems Per HPI  Physical Exam Triage Vital Signs ED Triage Vitals  Enc Vitals Group     BP 03/29/23 1600 117/75     Pulse Rate 03/29/23 1600 86     Resp 03/29/23 1600 17      Temp 03/29/23 1600 97.8 F (36.6 C)     Temp Source 03/29/23 1600 Oral     SpO2 03/29/23 1600 96 %     Weight --      Height --      Head Circumference --      Peak Flow --      Pain Score 03/29/23 1558 6     Pain Loc --      Pain Edu? --      Excl. in GC? --    No data found.  Updated Vital Signs BP 117/75 (BP Location: Left Arm)   Pulse 86   Temp 97.8 F (36.6 C) (Oral)   Resp 17   SpO2 96%   Visual Acuity Right Eye Distance:   Left Eye Distance:   Bilateral Distance:    Right Eye Near:   Left Eye Near:    Bilateral Near:     Physical Exam Vitals and nursing note reviewed.  Constitutional:      Appearance: She is not ill-appearing or toxic-appearing.  HENT:     Head: Normocephalic and atraumatic.     Right Ear: Hearing and external ear normal.     Left Ear: Hearing and external ear normal.     Nose: Nose normal.     Mouth/Throat:     Lips: Pink.  Eyes:     General: Lids are normal. Vision grossly intact. Gaze aligned appropriately.     Extraocular Movements: Extraocular movements intact.     Conjunctiva/sclera: Conjunctivae normal.  Pulmonary:     Effort: Pulmonary effort is normal.  Musculoskeletal:     Right upper arm: Normal.     Left upper arm: Normal.     Right elbow: No swelling, deformity, effusion or lacerations. Normal range of motion. Tenderness present in lateral epicondyle. No radial head, medial epicondyle or olecranon process tenderness.     Left elbow: Normal.     Right forearm: Normal.     Left forearm: Normal.  Right wrist: Normal.     Left wrist: Normal.     Right hand: Normal.     Left hand: Normal.     Cervical back: Neck supple.     Comments: Normal strength and sensation distally to injury to bilateral upper extremities.  5/5 grip strength bilaterally.  +2 right radial pulse.  Skin:    General: Skin is warm and dry.     Capillary Refill: Capillary refill takes less than 2 seconds.     Findings: No rash.  Neurological:      General: No focal deficit present.     Mental Status: She is alert and oriented to person, place, and time. Mental status is at baseline.     Cranial Nerves: No dysarthria or facial asymmetry.  Psychiatric:        Mood and Affect: Mood normal.        Speech: Speech normal.        Behavior: Behavior normal.        Thought Content: Thought content normal.        Judgment: Judgment normal.      UC Treatments / Results  Labs (all labs ordered are listed, but only abnormal results are displayed) Labs Reviewed - No data to display  EKG   Radiology DG Elbow Complete Right  Result Date: 03/29/2023 CLINICAL DATA:  Fall in bathtub this morning with right elbow pain. EXAM: RIGHT ELBOW - COMPLETE 3+ VIEW COMPARISON:  None Available. FINDINGS: There is no evidence of fracture, dislocation, or joint effusion. There is no evidence of arthropathy or other focal bone abnormality. Soft tissues are unremarkable. IMPRESSION: Negative. Electronically Signed   By: Elberta Fortis M.D.   On: 03/29/2023 16:46    Procedures Procedures (including critical care time)  Medications Ordered in UC Medications - No data to display  Initial Impression / Assessment and Plan / UC Course  I have reviewed the triage vital signs and the nursing notes.  Pertinent labs & imaging results that were available during my care of the patient were reviewed by me and considered in my medical decision making (see chart for details).   1.  Fall, right elbow pain X-rays negative for signs of acute bony abnormality.  Will manage this with conservative care with RICE and ibuprofen as needed.  Walking referral to [facility] orthopedics to be used as needed should symptoms fail to improve in the next 3 to 5 days.  She is neurovascularly intact distally to injury and without red flag signs or symptoms indicating need for referral to the emergency department.  She is agreeable plan.  Discussed physical exam and available lab work  findings in clinic with patient.  Counseled patient regarding appropriate use of medications and potential side effects for all medications recommended or prescribed today. Discussed red flag signs and symptoms of worsening condition,when to call the PCP office, return to urgent care, and when to seek higher level of care in the emergency department. Patient verbalizes understanding and agreement with plan. All questions answered. Patient discharged in stable condition.    Final Clinical Impressions(s) / UC Diagnoses   Final diagnoses:  Fall, initial encounter  Right elbow pain     Discharge Instructions      X-rays are negative for fracture/broken bones. Take ibuprofen 600 mg every 6 hours as needed for elbow pain and inflammation. Take this medicine with food to avoid stomach upset. Apply ice to the elbow 20 minutes on 20 minutes off as  needed to reduce pain and inflammation. Schedule an appointment with the orthopedic provider listed on your paperwork for follow-up as needed.  If you develop any new or worsening symptoms or do not improve in the next 2 to 3 days, please return.  If your symptoms are severe, please go to the emergency room.  Follow-up with your primary care provider for further evaluation and management of your symptoms as well as ongoing wellness visits.  I hope you feel better!     ED Prescriptions   None    PDMP not reviewed this encounter.   Carlisle Beers, [place] 03/29/23 1651

## 2023-03-29 NOTE — Discharge Instructions (Signed)
X-rays are negative for fracture/broken bones. Take ibuprofen 600 mg every 6 hours as needed for elbow pain and inflammation. Take this medicine with food to avoid stomach upset. Apply ice to the elbow 20 minutes on 20 minutes off as needed to reduce pain and inflammation. Schedule an appointment with the orthopedic provider listed on your paperwork for follow-up as needed.  If you develop any new or worsening symptoms or do not improve in the next 2 to 3 days, please return.  If your symptoms are severe, please go to the emergency room.  Follow-up with your primary care provider for further evaluation and management of your symptoms as well as ongoing wellness visits.  I hope you feel better!

## 2023-04-09 ENCOUNTER — Ambulatory Visit: Payer: BLUE CROSS/BLUE SHIELD | Admitting: Adult Health

## 2023-04-12 ENCOUNTER — Ambulatory Visit: Payer: BLUE CROSS/BLUE SHIELD | Attending: Family Medicine | Admitting: Pharmacist

## 2023-04-12 DIAGNOSIS — Z79899 Other long term (current) drug therapy: Secondary | ICD-10-CM

## 2023-04-12 LAB — POCT GLYCOSYLATED HEMOGLOBIN (HGB A1C): HbA1c, POC (controlled diabetic range): 7.9 % — AB (ref 0.0–7.0)

## 2023-04-12 NOTE — Research (Signed)
    S:     No chief complaint on file.  57 y.o. female who presents for diabetes evaluation, education, and management in the context of the LIBERATE Study. We obtained referral and consent on 6/3 and [id number]/52024 respectfully. However, pt's A1c today at baseline is under 8, therefore, making her ineligible for LIBERATE.    O:   Lab Results  Component Value Date   HGBA1C 7.9 (A) 04/12/2023      A/P:  LIBERATE Study:  -Does not qualify given today's A1c.  Written patient instructions provided. Patient verbalized understanding of treatment plan.  Total time in face to face counseling 20 minutes.    Follow-up:  Pharmacist prn. PCP clinic visit next week.   Butch Penny, PharmD, Patsy [place], CPP Clinical Pharmacist [facility] [phone]

## 2023-04-15 ENCOUNTER — Other Ambulatory Visit: Payer: Self-pay

## 2023-04-15 ENCOUNTER — Inpatient Hospital Stay: Payer: BLUE CROSS/BLUE SHIELD | Attending: Adult Health | Admitting: Adult Health

## 2023-04-15 ENCOUNTER — Encounter: Payer: Self-pay | Admitting: Adult Health

## 2023-04-15 VITALS — BP 127/77 | HR 95 | Temp 97.9°F | Resp 16 | Wt 150.1 lb

## 2023-04-15 DIAGNOSIS — Z170 Estrogen receptor positive status [ER+]: Secondary | ICD-10-CM

## 2023-04-15 DIAGNOSIS — C50311 Malignant neoplasm of lower-inner quadrant of right female breast: Secondary | ICD-10-CM

## 2023-04-15 DIAGNOSIS — Z853 Personal history of malignant neoplasm of breast: Secondary | ICD-10-CM | POA: Insufficient documentation

## 2023-04-15 DIAGNOSIS — Z08 Encounter for follow-up examination after completed treatment for malignant neoplasm: Secondary | ICD-10-CM | POA: Diagnosis present

## 2023-04-15 NOTE — Assessment & Plan Note (Signed)
Rebecca [name] is a 57 year old woman with stage IIIB triple negative right sided breast cancer diagnosed in 12/2020 s/p neoadjuvant chemotherapy, maintenance Keytruda, right lumpectomy, and adjuvant radiation therapy.  Stage IIIb triple negative right breast cancer: She has no clinical or radiographic signs of breast cancer recurrence.  She has not yet undergone annual mammogram and I placed orders for this to occur.  She normally has this completed at [facility].  I placed orders and asked my nurse to help with getting it scheduled. Bone health: She was given education about calcium, vitamin D, and weightbearing exercises. Health maintenance: I congratulated her on her 8 pound weight loss.  I recommended continued healthy diet and exercise 150 minutes a week.  RTC in 6 months for follow-up.

## 2023-04-15 NOTE — Progress Notes (Signed)
S/w pt regarding MM orders. She states she has a new insurance since going to [facility] for last MM. She ie unsure where her insurance will cover for her to go. Advised pt to call insurance to find out where she could go to have MM. She agreed and states she will call us to let us know.

## 2023-04-15 NOTE — Progress Notes (Unsigned)
[facility] Cancer Follow up:    Rebecca Register, MD [address] [place] [address] [place] [address]   DIAGNOSIS:  Cancer Staging  Malignant neoplasm of lower-inner quadrant of right breast of female, estrogen receptor positive (HCC) Staging form: Breast, AJCC 8th Edition - Clinical: Stage IIIB (cT2, cN1, cM0, G3, ER-, PR-, HER2-) - Signed by Lowella Dell, MD on 01/18/2021 Stage prefix: Initial diagnosis Histologic grading system: 3 grade system   SUMMARY OF ONCOLOGIC HISTORY: Oncology History  Malignant neoplasm of lower-inner quadrant of right breast of female, estrogen receptor positive (HCC)  01/06/2021 Initial Diagnosis   [place] woman status post right breast lower inner quadrant biopsy 01/06/2021 for a clinically T2 N1, stage 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  The report date is January 31, 2021.  The CancerNext-Expanded gene panel offered by [facility] and includes sequencing, rearrangement, and RNA analysis for the following 77 genes: AIP, ALK, APC, ATM, AXIN2, BAP1, BARD1, BLM, BMPR1A, BRCA1, BRCA2, BRIP1, CDC73, CDH1, CDK4, CDKN1B, CDKN2A, CHEK2, CTNNA1, DICER1, FANCC, FH, FLCN, GALNT12, KIF1B, LZTR1, MAX, MEN1, MET, MLH1, MSH2, MSH3, MSH6, MUTYH, NBN, NF1, NF2, NTHL1, PALB2, PHOX2B, PMS2, POT1, PRKAR1A, PTCH1, PTEN, RAD51C, RAD51D, RB1,  RECQL, RET, SDHA, SDHAF2, SDHB, SDHC, SDHD, SMAD4, SMARCA4, SMARCB1, SMARCE1, STK11, SUFU, TMEM127, TP53, TSC1, TSC2, VHL and XRCC2 (sequencing and deletion/duplication); EGFR, EGLN1, HOXB13, KIT, MITF, PDGFRA, POLD1, and POLE (sequencing only); EPCAM and GREM1 (deletion/duplication only).    02/07/2021 - 05/23/2021 Neo-Adjuvant Chemotherapy    neoadjuvant chemotherapy consisting of pembrolizumab given day 1 together with carboplatin and paclitaxel, with Taxol given on day 8 and 15, and filgrastim on days 16,17 and 18, started 02/07/2021, repeated every 21 days x 2, followed by doxorubicin and cyclophosphamide every 21 days x 4 also given with pembrolizumab on day 1 and PEG filgrastim day 3, started 03/22/2021 and completed 05/23/2021             (a) carboplatin/paclitaxel discontinued after the cycle 2-day 8 dose with neuropathy developing (subsequently resolved)   06/13/2021 - 01/30/2022 Chemotherapy   Patient is on Treatment Plan : HEAD/NECK Pembrolizumab Q21D     07/11/2021 Surgery   right lumpectomy and sentinel lymph node sampling 07/11/2021 showed a complete pathologic response (ypT0 ypN0)             (a) a total of 3 right axillary lymph nodes were removed   08/16/2021 - 09/29/2021 Radiation Therapy   08/16/2021 through 09/29/2021 Site Technique Total Dose (Gy) Dose per Fx (Gy) Completed Fx Beam Energies  Breast, Right: Breast_Rt_IMN 3D 50/50 2 25/25 10X  Breast, Right: Breast_Rt_PAB_SCV 3D 50/50 2 25/25 10X, 15X  Breast, Right: Breast_Rt_Bst 3D 10/10 2 5/5 6X, 10X      CURRENT THERAPY: Observation  INTERVAL HISTORY: Rebecca [name] 57 y.o. female  returns for follow-up of her history of triple negative breast cancer.  She is doing well.  She tells me that she is working on dietary measures.  She has stopped eating carbohydrates and is down 8 pounds.  She works as a Holiday representative at [employer], she does not exercise outside of her job because it requires her to stand all day.  He has not yet  undergone her annual mammogram.  She is unsure where this needs to occur because she had to go to several different places last year before she found somewhere her insurance would approve.   Patient Active Problem List   Diagnosis Date Noted   Chest pain 01/30/2022   Arthralgia of shoulder 12/11/2021   Right otitis media 09/26/2021   Port-A-Cath in place 02/07/2021   Genetic testing 02/02/2021   Family history of throat cancer 01/18/2021   Family history of lymphoma 01/18/2021   Malignant neoplasm of lower-inner quadrant of right breast of female, estrogen receptor positive (HCC) 01/12/2021   Posterior tibial tendinitis, right leg 01/26/2020   Diabetes (HCC) 03/11/2017   Muscle pain 08/06/2013   Tiredness 08/06/2013   Candida vaginitis 08/19/2012   Dental cavities 08/19/2012   Knee pain, bilateral 04/16/2012   Diabetes mellitus type 2, controlled (HCC) 04/16/2012   Hyperlipidemia 04/16/2012   Palpitations 04/16/2012   Back pain 04/16/2012    is allergic to lisinopril and heparin sod (pork) lock flush.  MEDICAL HISTORY: Past Medical History:  Diagnosis Date   Arthritis    Breast cancer (HCC)    Diabetes mellitus    Family history of lymphoma 01/18/2021   Family history of throat cancer 01/18/2021   Hyperlipidemia    Hypertension     SURGICAL HISTORY: Past Surgical History:  Procedure Laterality Date   BREAST LUMPECTOMY WITH RADIOACTIVE SEED AND SENTINEL LYMPH NODE BIOPSY Right 07/11/2021   Procedure: RIGHT BREAST LUMPECTOMY WITH RADIOACTIVE SEED AND RIGHT SENTINEL LYMPH NODE BIOPSY;  Surgeon: Harriette Bouillon, MD;  Location: [facility];  Service: General;  Laterality: Right;   CESAREAN SECTION     PORT-A-CATH REMOVAL Right 05/22/2022   Procedure: REMOVAL PORT-A-CATH;  Surgeon: Harriette Bouillon, MD;  Location: [facility];  Service: General;  Laterality: Right;   PORTACATH PLACEMENT N/A 01/26/2021   Procedure: INSERTION PORT-A-CATH;  Surgeon:  Harriette Bouillon, MD;  Location: [facility];  Service: General;  Laterality: N/A;  60   RADIOACTIVE SEED GUIDED AXILLARY SENTINEL LYMPH NODE Right 07/11/2021   Procedure: RADIOACTIVE SEED GUIDED RIGHT AXILLARY LYMPH NODE BIOPSY;  Surgeon: Harriette Bouillon, MD;  Location: [facility];  Service: General;  Laterality: Right;    SOCIAL HISTORY: Social History   Socioeconomic History   Marital status: Divorced    Spouse name: Not on file   Number of children: Not on file   Years of education: Not on file   Highest education level: Not on file  Occupational History   Not on file  Tobacco Use   Smoking status: Never    Passive exposure: Yes   Smokeless tobacco: Never  Vaping Use   Vaping Use: Never used  Substance and Sexual Activity   Alcohol use: No   Drug use: No   Sexual activity: Not Currently  Other Topics Concern   Not on file  Social History Narrative   Not on file   Social Determinants of Health   Financial Resource Strain: Not on file  Food Insecurity: Not on file  Transportation Needs: No Transportation Needs (  01/18/2021)   PRAPARE - Administrator, Civil Service (Medical): No    Lack of Transportation (Non-Medical): No  Physical Activity: Not on file  Stress: Not on file  Social Connections: Not on file  Intimate Partner Violence: Not on file    FAMILY HISTORY: Family History  Problem Relation Age of Onset   Diabetes Mother    Heart disease Mother    Diabetes Father    Throat cancer Father        d. 8s   Diabetes Sister    Diabetes Brother    Lymphoma Cousin        maternal cousin; d. 30s    Review of Systems  Constitutional:  Negative for appetite change, chills, fatigue, fever and unexpected weight change.  HENT:   Negative for hearing loss, lump/mass and trouble swallowing.   Eyes:  Negative for eye problems and icterus.  Respiratory:  Negative for chest tightness, cough and shortness of breath.   Cardiovascular:  Negative for  chest pain, leg swelling and palpitations.  Gastrointestinal:  Negative for abdominal distention, abdominal pain, constipation, diarrhea, nausea and vomiting.  Endocrine: Negative for hot flashes.  Genitourinary:  Negative for difficulty urinating.   Musculoskeletal:  Negative for arthralgias.  Skin:  Negative for itching and rash.  Neurological:  Negative for dizziness, extremity weakness, headaches and numbness.  Hematological:  Negative for adenopathy. Does not bruise/bleed easily.  Psychiatric/Behavioral:  Negative for depression. The patient is not nervous/anxious.       PHYSICAL EXAMINATION    Vitals:   04/15/23 1535  BP: 127/77  Pulse: 95  Resp: 16  Temp: 97.9 F (36.6 C)  SpO2: 95%    Physical Exam Constitutional:      General: She is not in acute distress.    Appearance: Normal appearance. She is not toxic-appearing.  HENT:     Head: Normocephalic and atraumatic.     Mouth/Throat:     Mouth: Mucous membranes are moist.     Pharynx: Oropharynx is clear. No oropharyngeal exudate or posterior oropharyngeal erythema.  Eyes:     General: No scleral icterus. Cardiovascular:     Rate and Rhythm: Normal rate and regular rhythm.     Pulses: Normal pulses.     Heart sounds: Normal heart sounds.  Pulmonary:     Effort: Pulmonary effort is normal.     Breath sounds: Normal breath sounds.  Chest:     Comments: Right breast status postlumpectomy and radiation no sign of local recurrence left breast is benign. Abdominal:     General: Abdomen is flat. Bowel sounds are normal. There is no distension.     Palpations: Abdomen is soft.     Tenderness: There is no abdominal tenderness.  Musculoskeletal:        General: No swelling.     Cervical back: Neck supple.  Lymphadenopathy:     Cervical: No cervical adenopathy.  Skin:    General: Skin is warm and dry.     Findings: No rash.  Neurological:     General: No focal deficit present.     Mental Status: She is alert.   Psychiatric:        Mood and Affect: Mood normal.        Behavior: Behavior normal.     LABORATORY DATA:  CBC    Component Value Date/Time   WBC 9.4 08/20/2022 0204   RBC 4.86 08/20/2022 0204   HGB 13.0 08/20/2022 0204   HGB  13.0 08/06/2022 1316   HCT 39.4 08/20/2022 0204   PLT 242 08/20/2022 0204   PLT 237 08/06/2022 1316   MCV 81.1 08/20/2022 0204   MCH 26.7 08/20/2022 0204   MCHC 33.0 08/20/2022 0204   RDW 14.1 08/20/2022 0204   LYMPHSABS 0.9 08/20/2022 0204   MONOABS 0.7 08/20/2022 0204   EOSABS 0.2 08/20/2022 0204   BASOSABS 0.0 08/20/2022 0204    CMP     Component Value Date/Time   NA 140 08/20/2022 0204   NA 136 07/16/2022 0845   K 4.0 08/20/2022 0204   CL 104 08/20/2022 0204   CO2 24 08/20/2022 0204   GLUCOSE 171 (H) 08/20/2022 0204   BUN 24 (H) 08/20/2022 0204   BUN 19 07/16/2022 0845   CREATININE 0.68 08/20/2022 0204   CREATININE 0.73 08/06/2022 1316   CREATININE 0.50 11/02/2016 1102   CALCIUM 9.7 08/20/2022 0204   PROT 7.2 08/20/2022 0204   PROT 8.0 07/16/2022 0845   ALBUMIN 3.7 08/20/2022 0204   ALBUMIN 5.3 (H) 07/16/2022 0845   AST 22 08/20/2022 0204   AST 19 08/06/2022 1316   ALT 19 08/20/2022 0204   ALT 22 08/06/2022 1316   ALKPHOS 67 08/20/2022 0204   BILITOT 0.6 08/20/2022 0204   BILITOT 0.3 08/06/2022 1316   GFRNONAA >60 08/20/2022 0204   GFRNONAA >60 08/06/2022 1316   GFRNONAA >89 01/16/2016 1006   GFRAA 118 06/02/2020 0942   GFRAA >89 01/16/2016 1006     ASSESSMENT and THERAPY PLAN:   Malignant neoplasm of lower-inner quadrant of right breast of female, estrogen receptor positive (HCC) Rebecca [name] is a 57 year old woman with stage IIIB triple negative right sided breast cancer diagnosed in 12/2020 s/p neoadjuvant chemotherapy, maintenance Keytruda, right lumpectomy, and adjuvant radiation therapy.  Stage IIIb triple negative right breast cancer: She has no clinical or radiographic signs of breast cancer recurrence.  She has not yet  undergone annual mammogram and I placed orders for this to occur.  She normally has this completed at [facility].  I placed orders and asked my nurse to help with getting it scheduled. Bone health: She was given education about calcium, vitamin D, and weightbearing exercises. Health maintenance: I congratulated her on her 8 pound weight loss.  I recommended continued healthy diet and exercise 150 minutes a week.  RTC in 6 months for follow-up.    All questions were answered. The patient knows to call the clinic with any problems, questions or concerns. We can certainly see the patient much sooner if necessary.  Total encounter time:20 minutes*in face-to-face visit time, chart review, lab review, care coordination, order entry, and documentation of the encounter time.    Lillard Anes, NP 04/15/23 3:56 PM Medical Oncology and Hematology [facility] [address] [place], [place] [address] Tel. [phone]    Fax. [phone]  *Total Encounter Time as defined by the Centers for Medicare and Medicaid Services includes, in addition to the face-to-face time of a patient visit (documented in the note above) non-face-to-face time: obtaining and reviewing outside history, ordering and reviewing medications, tests or procedures, care coordination (communications with other health care professionals or caregivers) and documentation in the medical record.

## 2023-07-26 ENCOUNTER — Other Ambulatory Visit: Payer: Self-pay | Admitting: Family Medicine

## 2023-07-26 DIAGNOSIS — Z1211 Encounter for screening for malignant neoplasm of colon: Secondary | ICD-10-CM

## 2023-07-26 DIAGNOSIS — Z1212 Encounter for screening for malignant neoplasm of rectum: Secondary | ICD-10-CM

## 2023-07-29 ENCOUNTER — Telehealth: Payer: Self-pay | Admitting: *Deleted

## 2023-07-29 NOTE — Telephone Encounter (Addendum)
Pt was advised that caller following up from Dr. Baxter Flattery office to let her know a colon cancer screening test named Cologuard from [facility] had been ordered by Dr. Alvis Lemmings for her so that she could complete a colon cancer screening test that would arrive in a box. Dr. Alvis Lemmings ordred this test because she wants her to be as healthy as possible, and completing this test can sometimes catch a condition BEFORE the pt actually has cancer, so it is for her own safety and health.  Pt was asked if she had any questions or concerns, reasons she might not be able to complete the test. Pt said she would completed the test asked about directions for completing the test. Pt was told the box would have pictures about how to complete the specimen properly and how to send it back. The instructions would be in English but she would get the number of [employer] and could call and ask for an Arabic interpreter or even call Dr. Baxter Flattery office, and ask for an Arabic interpreter, IF needed. Pt stated she would complete the test and asked about mailing it back. Caller assured pt the instructions were in the box and that she would just be mailing the box back to the [facility] address; it would be pre-addressed and postage was already paid so it would not cost her anything to mail it and the test should be covered by her insurance.  Chart review indicates that pt has had concerns about her ACA [employer] not covering some [facility] services and Dr. Baxter Flattery office RN Care Manager, Robyne Peers, had offered in the past to connect the pt with a Medicaid DHHS adviser for f/u. During phone call today, pt said she forgot to f/u with the Medicaid adviser or her ACA and forgot she needed a mmg also (for which chart review indicates pt was also concerned about insurance coverage.  Letter sent in English on 07/29/23 - will be sent in Arabic once translated.  Pt agreed that this caller could get [facility]-based DHHS Medicaid enroller to  call pt to see if she would qualify for Medicaid now, so email sent to Cathlyn Parsons. DHHS.  Info from [facility], 07/29/23 10:30AM , Cathlyn Parsons "I spoke with her a few minutes ago.  She has an active Family Planning Medicaid case so we are not permitted to take a new application, we have to evaluate for a change in circumstance.  I think the Special Enrollment Period that opens in the ACA due to a Medicaid denial gives people a 60-day window.  I just tried to start a new [employer] application as a test and one of the qualifying events was "denied for Medicaid since 05/30/23".  She was denied in June, so she would need to wait until Open Enrollment.  I evaluated for a change in circumstances now but unfortunately, her household size is the same, income has increased, etc.  She remains eligible for [facility] Planning only.  I did explain that she could report a change in circumstance (i.e. less income) to [place] if that ever were to happen, and potentially be re-evaluated for full Medicaid.    But - we also discussed that she is close to full-time, and she is going to try to get insurance through [facility].  I think that is the best option for her."   08/12/23 Letter sent in Arabic to pt today. Trixie Dredge, 7:49A

## 2023-10-14 ENCOUNTER — Other Ambulatory Visit: Payer: Self-pay | Admitting: Family Medicine

## 2023-10-14 DIAGNOSIS — T451X5A Adverse effect of antineoplastic and immunosuppressive drugs, initial encounter: Secondary | ICD-10-CM

## 2023-10-14 DIAGNOSIS — E1159 Type 2 diabetes mellitus with other circulatory complications: Secondary | ICD-10-CM

## 2023-10-21 ENCOUNTER — Telehealth: Payer: Self-pay

## 2023-10-21 ENCOUNTER — Inpatient Hospital Stay: Payer: BLUE CROSS/BLUE SHIELD | Attending: Adult Health | Admitting: Adult Health

## 2023-10-21 ENCOUNTER — Ambulatory Visit: Payer: BLUE CROSS/BLUE SHIELD | Admitting: Adult Health

## 2023-10-21 VITALS — BP 154/92 | HR 105 | Temp 97.9°F | Resp 18 | Ht 63.0 in | Wt 148.6 lb

## 2023-10-21 DIAGNOSIS — N644 Mastodynia: Secondary | ICD-10-CM | POA: Diagnosis present

## 2023-10-21 DIAGNOSIS — C50311 Malignant neoplasm of lower-inner quadrant of right female breast: Secondary | ICD-10-CM | POA: Insufficient documentation

## 2023-10-21 DIAGNOSIS — Z170 Estrogen receptor positive status [ER+]: Secondary | ICD-10-CM | POA: Diagnosis not present

## 2023-10-21 DIAGNOSIS — Z79899 Other long term (current) drug therapy: Secondary | ICD-10-CM | POA: Insufficient documentation

## 2023-10-21 NOTE — Progress Notes (Unsigned)
[facility] Cancer Follow up:    Rebecca Register, MD [address] [place] [address]   DIAGNOSIS: Cancer Staging  Malignant neoplasm of lower-inner quadrant of right breast of female, estrogen receptor positive (HCC) Staging form: Breast, AJCC 8th Edition - Clinical: Stage IIIB (cT2, cN1, cM0, G3, ER-, PR-, HER2-) - Signed by Lowella Dell, MD on 01/18/2021 Stage prefix: Initial diagnosis Histologic grading system: 3 grade system   SUMMARY OF ONCOLOGIC HISTORY: Oncology History  Malignant neoplasm of lower-inner quadrant of right breast of female, estrogen receptor positive (HCC)  01/06/2021 Initial Diagnosis   [place] woman status post right breast lower inner quadrant biopsy 01/06/2021 for a clinically T2 N1, stage 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  The report date is January 31, 2021.  The CancerNext-Expanded gene panel offered by [facility] and includes sequencing, rearrangement, and RNA analysis for the following 77 genes: AIP, ALK, APC, ATM, AXIN2, BAP1, BARD1, BLM, BMPR1A, BRCA1, BRCA2, BRIP1, CDC73, CDH1, CDK4, CDKN1B, CDKN2A, CHEK2, CTNNA1, DICER1, FANCC, FH, FLCN, GALNT12, KIF1B, LZTR1, MAX, MEN1, MET, MLH1, MSH2, MSH3, MSH6, MUTYH, NBN, NF1, NF2, NTHL1, PALB2, PHOX2B, PMS2, POT1, PRKAR1A, PTCH1, PTEN, RAD51C, RAD51D, RB1,  RECQL, RET, SDHA, SDHAF2, SDHB, SDHC, SDHD, SMAD4, SMARCA4, SMARCB1, SMARCE1, STK11, SUFU, TMEM127, TP53, TSC1, TSC2, VHL and XRCC2 (sequencing and deletion/duplication); EGFR, EGLN1, HOXB13, KIT, MITF, PDGFRA, POLD1, and POLE (sequencing only); EPCAM and GREM1 (deletion/duplication only).    02/07/2021 - 05/23/2021 Neo-Adjuvant Chemotherapy    neoadjuvant chemotherapy consisting of pembrolizumab given day 1 together with carboplatin and paclitaxel, with Taxol given on day 8 and 15, and filgrastim on days 16,17 and 18, started 02/07/2021, repeated every 21 days x 2, followed by doxorubicin and cyclophosphamide every 21 days x 4 also given with pembrolizumab on day 1 and PEG filgrastim day 3, started 03/22/2021 and completed 05/23/2021             (a) carboplatin/paclitaxel discontinued after the cycle 2-day 8 dose with neuropathy developing (subsequently resolved)   06/13/2021 - 01/30/2022 Chemotherapy   Patient is on Treatment Plan : HEAD/NECK Pembrolizumab Q21D     07/11/2021 Surgery   right lumpectomy and sentinel lymph node sampling 07/11/2021 showed a complete pathologic response (ypT0 ypN0)             (a) a total of 3 right axillary lymph nodes were removed   08/16/2021 - 09/29/2021 Radiation Therapy   08/16/2021 through 09/29/2021 Site Technique Total Dose (Gy) Dose per Fx (Gy) Completed Fx Beam Energies  Breast, Right: Breast_Rt_IMN 3D 50/50 2 25/25 10X  Breast, Right: Breast_Rt_PAB_SCV 3D 50/50 2 25/25 10X, 15X  Breast, Right: Breast_Rt_Bst 3D 10/10 2 5/5 6X, 10X       CURRENT THERAPY:  INTERVAL HISTORY: Rebecca [name] 57 y.o. female returns  for    Patient Active Problem List   Diagnosis Date Noted  . Chest pain 01/30/2022  . Arthralgia of shoulder 12/11/2021  . Right otitis media 09/26/2021  . Port-A-Cath in place 02/07/2021  . Genetic testing 02/02/2021  . Family history of throat cancer 01/18/2021  . Family history of lymphoma 01/18/2021  . Malignant neoplasm of  lower-inner quadrant of right breast of female, estrogen receptor positive (HCC) 01/12/2021  . Posterior tibial tendinitis, right leg 01/26/2020  . Diabetes (HCC) 03/11/2017  . Muscle pain 08/06/2013  . Tiredness 08/06/2013  . Candida vaginitis 08/19/2012  . Dental cavities 08/19/2012  . Knee pain, bilateral 04/16/2012  . Diabetes mellitus type 2, controlled (HCC) 04/16/2012  . Hyperlipidemia 04/16/2012  . Palpitations 04/16/2012  . Back pain 04/16/2012    is allergic to lisinopril and heparin sod (pork) lock flush.  MEDICAL HISTORY: Past Medical History:  Diagnosis Date  . Arthritis   . Breast cancer (HCC)   . Diabetes mellitus   . Family history of lymphoma 01/18/2021  . Family history of throat cancer 01/18/2021  . Hyperlipidemia   . Hypertension     SURGICAL HISTORY: Past Surgical History:  Procedure Laterality Date  . BREAST LUMPECTOMY WITH RADIOACTIVE SEED AND SENTINEL LYMPH NODE BIOPSY Right 07/11/2021   Procedure: RIGHT BREAST LUMPECTOMY WITH RADIOACTIVE SEED AND RIGHT SENTINEL LYMPH NODE BIOPSY;  Surgeon: Harriette Bouillon, MD;  Location: [facility];  Service: General;  Laterality: Right;  . CESAREAN SECTION    . PORT-A-CATH REMOVAL Right 05/22/2022   Procedure: REMOVAL PORT-A-CATH;  Surgeon: Harriette Bouillon, MD;  Location: [facility];  Service: General;  Laterality: Right;  . PORTACATH PLACEMENT N/A 01/26/2021   Procedure: INSERTION PORT-A-CATH;  Surgeon: Harriette Bouillon, MD;  Location: [facility];  Service: General;  Laterality: N/A;  60  . RADIOACTIVE SEED GUIDED AXILLARY SENTINEL LYMPH NODE Right 07/11/2021   Procedure: RADIOACTIVE SEED GUIDED RIGHT AXILLARY LYMPH NODE BIOPSY;  Surgeon: Harriette Bouillon, MD;  Location: [facility];  Service: General;  Laterality: Right;    SOCIAL HISTORY: Social History   Socioeconomic History  . Marital status: Divorced    Spouse name: Not on file  . Number of children: Not on file  . Years of  education: Not on file  . Highest education level: Not on file  Occupational History  . Not on file  Tobacco Use  . Smoking status: Never    Passive exposure: Yes  . Smokeless tobacco: Never  Vaping Use  . Vaping status: Never Used  Substance and Sexual Activity  . Alcohol use: No  . Drug use: No  . Sexual activity: Not Currently  Other Topics Concern  . Not on file  Social History Narrative  . Not on file   Social Drivers of Health   Financial Resource Strain: Not on file  Food Insecurity: Not on file  Transportation Needs: No Transportation Needs (01/18/2021)   PRAPARE - Transportation   . Lack of Transportation (Medical): No   . Lack of Transportation (Non-Medical): No  Physical Activity: Not on file  Stress: Not on file  Social Connections: Not on file  Intimate Partner Violence: Not on file    FAMILY HISTORY: Family History  Problem Relation Age of Onset  . Diabetes Mother   . Heart disease Mother   . Diabetes Father   . Throat cancer Father        d. 19s  . Diabetes Sister   . Diabetes  Brother   . Lymphoma Cousin        maternal cousin; d. 45s    Review of Systems - Oncology    PHYSICAL EXAMINATION   Onc Performance Status - 10/21/23 1200       KPS SCALE   KPS % SCORE Normal activity with effort, some s/s of disease             Vitals:   10/21/23 1242  BP: (!) 154/92  Pulse: (!) 105  Resp: 18  Temp: 97.9 F (36.6 C)  SpO2: 99%    Physical Exam  LABORATORY DATA:  CBC    Component Value Date/Time   WBC 9.4 08/20/2022 0204   RBC 4.86 08/20/2022 0204   HGB 13.0 08/20/2022 0204   HGB 13.0 08/06/2022 1316   HCT 39.4 08/20/2022 0204   PLT 242 08/20/2022 0204   PLT 237 08/06/2022 1316   MCV 81.1 08/20/2022 0204   MCH 26.7 08/20/2022 0204   MCHC 33.0 08/20/2022 0204   RDW 14.1 08/20/2022 0204   LYMPHSABS 0.9 08/20/2022 0204   MONOABS 0.7 08/20/2022 0204   EOSABS 0.2 08/20/2022 0204   BASOSABS 0.0 08/20/2022 0204    CMP      Component Value Date/Time   NA 140 08/20/2022 0204   NA 136 07/16/2022 0845   K 4.0 08/20/2022 0204   CL 104 08/20/2022 0204   CO2 24 08/20/2022 0204   GLUCOSE 171 (H) 08/20/2022 0204   BUN 24 (H) 08/20/2022 0204   BUN 19 07/16/2022 0845   CREATININE 0.68 08/20/2022 0204   CREATININE 0.73 08/06/2022 1316   CREATININE 0.50 11/02/2016 1102   CALCIUM 9.7 08/20/2022 0204   PROT 7.2 08/20/2022 0204   PROT 8.0 07/16/2022 0845   ALBUMIN 3.7 08/20/2022 0204   ALBUMIN 5.3 (H) 07/16/2022 0845   AST 22 08/20/2022 0204   AST 19 08/06/2022 1316   ALT 19 08/20/2022 0204   ALT 22 08/06/2022 1316   ALKPHOS 67 08/20/2022 0204   BILITOT 0.6 08/20/2022 0204   BILITOT 0.3 08/06/2022 1316   GFRNONAA >60 08/20/2022 0204   GFRNONAA >60 08/06/2022 1316   GFRNONAA >89 01/16/2016 1006   GFRAA 118 06/02/2020 0942   GFRAA >89 01/16/2016 1006       PENDING LABS:   RADIOGRAPHIC STUDIES:  No results found.   PATHOLOGY:     ASSESSMENT and THERAPY PLAN:   No problem-specific Assessment & Plan notes found for this encounter.   No orders of the defined types were placed in this encounter.   All questions were answered. The patient knows to call the clinic with any problems, questions or concerns. We can certainly see the patient much sooner if necessary. This note was electronically signed. Noreene Filbert, NP 10/21/2023

## 2023-10-21 NOTE — Telephone Encounter (Signed)
Called Gi per Mardella Layman to see if they accept [employer] [facility] waiting on a phone call back from the insurance for clarification. Pt needs a scheduled mammo and ultrasound.

## 2023-10-24 NOTE — Assessment & Plan Note (Signed)
 Rebecca [name] is a 58 year old woman with stage IIIB triple negative right sided breast cancer diagnosed in 12/2020 s/p neoadjuvant chemotherapy, maintenance Keytruda , right lumpectomy, and adjuvant radiation therapy.  We reviewed the assessment and plan with the help of an Arabic interpreter.  Stage IIIb triple negative right breast cancer: She is experiencing breast pain and swelling.  She has not undergone mammogram in the past 3 years.  We discussed the importance of regular mammograms and the limitations and physical exam alone in diagnosing breast conditions.  -Order diagnostic mammogram and ultrasound. -Advise over-the-counter Tylenol  for pain as needed. -If no contact from imaging center by Thursday afternoon, patient to call the office.  RTC in 6 months for follow-up.

## 2023-10-25 ENCOUNTER — Telehealth: Payer: Self-pay | Admitting: *Deleted

## 2023-10-25 NOTE — Telephone Encounter (Signed)
 Orders for mammogram and [place] faxed to [facility] per patient request

## 2023-11-04 ENCOUNTER — Telehealth: Payer: Self-pay | Admitting: Family Medicine

## 2023-11-04 ENCOUNTER — Telehealth: Payer: Self-pay | Admitting: *Deleted

## 2023-11-04 NOTE — Telephone Encounter (Signed)
 Pt

## 2023-11-04 NOTE — Telephone Encounter (Signed)
 Error

## 2023-11-20 NOTE — Telephone Encounter (Signed)
No entry

## 2023-12-23 ENCOUNTER — Encounter: Payer: Self-pay | Admitting: Family Medicine

## 2023-12-23 ENCOUNTER — Ambulatory Visit: Payer: BLUE CROSS/BLUE SHIELD | Attending: Family Medicine | Admitting: Family Medicine

## 2023-12-23 VITALS — BP 132/85 | HR 102 | Ht 63.0 in | Wt 144.6 lb

## 2023-12-23 DIAGNOSIS — I10 Essential (primary) hypertension: Secondary | ICD-10-CM | POA: Diagnosis not present

## 2023-12-23 DIAGNOSIS — E119 Type 2 diabetes mellitus without complications: Secondary | ICD-10-CM | POA: Diagnosis not present

## 2023-12-23 DIAGNOSIS — M21611 Bunion of right foot: Secondary | ICD-10-CM

## 2023-12-23 DIAGNOSIS — G620 Drug-induced polyneuropathy: Secondary | ICD-10-CM

## 2023-12-23 DIAGNOSIS — E1159 Type 2 diabetes mellitus with other circulatory complications: Secondary | ICD-10-CM

## 2023-12-23 DIAGNOSIS — M25562 Pain in left knee: Secondary | ICD-10-CM | POA: Diagnosis not present

## 2023-12-23 DIAGNOSIS — E785 Hyperlipidemia, unspecified: Secondary | ICD-10-CM

## 2023-12-23 DIAGNOSIS — M21619 Bunion of unspecified foot: Secondary | ICD-10-CM

## 2023-12-23 DIAGNOSIS — Z7984 Long term (current) use of oral hypoglycemic drugs: Secondary | ICD-10-CM

## 2023-12-23 DIAGNOSIS — M791 Myalgia, unspecified site: Secondary | ICD-10-CM

## 2023-12-23 LAB — POCT GLYCOSYLATED HEMOGLOBIN (HGB A1C): HbA1c, POC (controlled diabetic range): 8.6 % — AB (ref 0.0–7.0)

## 2023-12-23 MED ORDER — METFORMIN HCL 500 MG PO TABS
ORAL_TABLET | ORAL | 1 refills | Status: DC
Start: 2023-12-23 — End: 2024-08-10

## 2023-12-23 MED ORDER — GLIPIZIDE 10 MG PO TABS: 10.0000 mg | ORAL_TABLET | Freq: Every day | ORAL | 1 refills | Status: DC

## 2023-12-23 MED ORDER — DAPAGLIFLOZIN PROPANEDIOL 10 MG PO TABS: ORAL_TABLET | ORAL | 1 refills | Status: DC

## 2023-12-23 MED ORDER — ROSUVASTATIN CALCIUM 20 MG PO TABS
20.0000 mg | ORAL_TABLET | Freq: Every day | ORAL | 1 refills | Status: DC
Start: 2023-12-23 — End: 2023-12-27

## 2023-12-23 MED ORDER — AMLODIPINE BESYLATE 10 MG PO TABS: 10.0000 mg | ORAL_TABLET | Freq: Every day | ORAL | 1 refills | Status: DC

## 2023-12-23 MED ORDER — GABAPENTIN 300 MG PO CAPS: 300.0000 mg | ORAL_CAPSULE | Freq: Every day | ORAL | 1 refills | Status: DC

## 2023-12-23 MED ORDER — METHOCARBAMOL 500 MG PO TABS
500.0000 mg | ORAL_TABLET | Freq: Three times a day (TID) | ORAL | 1 refills | Status: DC | PRN
Start: 2023-12-23 — End: 2024-06-17

## 2023-12-23 NOTE — Patient Instructions (Signed)
 VISIT SUMMARY:  Today, we discussed several health concerns including your leg pain, diabetes management, knee pain, and follow-up for your breast cancer treatment. We also reviewed your current medications and made some adjustments to better manage your conditions.  YOUR PLAN:  -BUNION: A bunion is a bony bump that forms on the joint at the base of your big toe. It can cause significant pain, especially when standing or walking. We will refer you to a podiatrist for further evaluation and management.  -KNEE PAIN: Your knee pain is likely due to a previous fall. To help support your knee, we recommend using an over-the-counter knee brace.  -TYPE 2 DIABETES MELLITUS: Type 2 diabetes is a condition where your body does not use insulin properly, leading to high blood sugar levels. Your diabetes is currently poorly controlled, so we will increase your metformin dosage to three tablets in the morning and two in the evening. Additionally, we discussed lifestyle changes to help manage stress-related eating.  -HYPERTENSION: Hypertension, or high blood pressure, is well controlled with your current medications. No changes are needed at this time.  -HYPERLIPIDEMIA: Hyperlipidemia is a condition where you have high levels of fats (lipids) in your blood. We need to check your cholesterol levels, so please schedule a fasting blood test.  -BREAST CANCER (STAGE 3B, TRIPLE NEGATIVE WITH 10% ER POSITIVE): You have been treated for stage 3B breast cancer with lumpectomy, chemotherapy, and radiation. Please continue your follow-up appointments with your oncology team.  INSTRUCTIONS:  Please schedule a fasting blood test to check your cholesterol levels. Continue your follow-up appointments with your oncology team for breast cancer. Additionally, follow up with a podiatrist for your bunion pain.

## 2023-12-23 NOTE — Progress Notes (Signed)
 Subjective:  Patient ID: Rebecca [name], female    DOB: [birth year]  Age: 58 y.o. MRN: [id number]  CC: Medical Management of Chronic Issues (Bilateral leg pain)     Discussed the use of AI scribe software for clinical note transcription with the patient, who gave verbal consent to proceed.  History of Present Illness The patient is a 58 year old female with a history of type two diabetes mellitus, hyperlipidemia, hypertension, and stage three B right breast cancer (status post neoadjuvant chemotherapy, maintenance Keytruda, right lumpectomy and adjuvant radiation).    She presents with pain in her feet, specifically in her bunion. The pain has been present for some time and has been increasing in severity.   The patient's diabetes has been poorly controlled recently, with her A1c increasing from 7.9 to 8.6. She attributes this increase to emotional stress and overeating when she is angry.  She endorses adherence with her Marcelline Deist, metformin and glipizide.   The patient also mentions that she has been seeing a cancer doctor for her breast cancer treatment. Last seen at oncology clinic in 09/2023 at which time diagnostic mammogram and ultrasound were ordered.  She also takes medication for her hypertension and hyperlipidemia.  The patient also reports a fall and subsequent pain in her left knee.    Past Medical History:  Diagnosis Date   Arthritis    Breast cancer (HCC)    Diabetes mellitus    Family history of lymphoma 01/18/2021   Family history of throat cancer 01/18/2021   Hyperlipidemia    Hypertension     Past Surgical History:  Procedure Laterality Date   BREAST LUMPECTOMY WITH RADIOACTIVE SEED AND SENTINEL LYMPH NODE BIOPSY Right 07/11/2021   Procedure: RIGHT BREAST LUMPECTOMY WITH RADIOACTIVE SEED AND RIGHT SENTINEL LYMPH NODE BIOPSY;  Surgeon: Harriette Bouillon, MD;  Location: [facility];  Service: General;  Laterality: Right;   CESAREAN SECTION      PORT-A-CATH REMOVAL Right 05/22/2022   Procedure: REMOVAL PORT-A-CATH;  Surgeon: Harriette Bouillon, MD;  Location: [facility];  Service: General;  Laterality: Right;   PORTACATH PLACEMENT N/A 01/26/2021   Procedure: INSERTION PORT-A-CATH;  Surgeon: Harriette Bouillon, MD;  Location: [facility];  Service: General;  Laterality: N/A;  60   RADIOACTIVE SEED GUIDED AXILLARY SENTINEL LYMPH NODE Right 07/11/2021   Procedure: RADIOACTIVE SEED GUIDED RIGHT AXILLARY LYMPH NODE BIOPSY;  Surgeon: Harriette Bouillon, MD;  Location: [facility];  Service: General;  Laterality: Right;    Family History  Problem Relation Age of Onset   Diabetes Mother    Heart disease Mother    Diabetes Father    Throat cancer Father        d. 44s   Diabetes Sister    Diabetes Brother    Lymphoma Cousin        maternal cousin; d. 30s    Social History   Socioeconomic History   Marital status: Divorced    Spouse name: Not on file   Number of children: Not on file   Years of education: Not on file   Highest education level: Not on file  Occupational History   Not on file  Tobacco Use   Smoking status: Never    Passive exposure: Yes   Smokeless tobacco: Never  Vaping Use   Vaping status: Never Used  Substance and Sexual Activity   Alcohol use: No   Drug use: No   Sexual activity: Not Currently  Other Topics Concern  Not on file  Social History Narrative   Not on file   Social Drivers of Health   Financial Resource Strain: Not on file  Food Insecurity: Not on file  Transportation Needs: No Transportation Needs (01/18/2021)   PRAPARE - Administrator, Civil Service (Medical): No    Lack of Transportation (Non-Medical): No  Physical Activity: Not on file  Stress: Not on file  Social Connections: Not on file    Allergies  Allergen Reactions   Lisinopril Swelling    Swelling in throat, coughing, difficulty swallowing, could not sleep. Occurred Dec 2020.   Heparin Sod  Chief Technology Officer) Lock Flush Other (See Comments)    Pork derived product    Outpatient Medications Prior to Visit  Medication Sig Dispense Refill   acetaminophen (TYLENOL) 500 MG tablet Take 1 tablet (500 mg total) by mouth 3 (three) times daily as needed (with one aleve tablet). 30 tablet 0   Ascorbic Acid (VITAMIN C) 1000 MG tablet Take 1 tablet (1,000 mg total) by mouth daily. 30 tablet 0   Blood Glucose Monitoring Suppl (ONETOUCH VERIO REFLECT) w/Device KIT 1 kit by Does not apply route in the morning, at noon, and at bedtime. Use as directed to test blood sugar up to three times daily.E11.9 1 kit 0   ergocalciferol (DRISDOL) 1.25 MG (50000 UT) capsule Take 1 capsule (50,000 Units total) by mouth once a week. 12 capsule 1   glucose blood (ONETOUCH VERIO) test strip Use as directed to test blood sugar up to three times daily. E11.9 100 each 6   Glucose Blood (TRUE METRIX BLOOD GLUCOSE TEST VI) See admin instructions.     glucose blood test strip See admin instructions.     labetalol (NORMODYNE) 100 MG tablet Take 100 mg by mouth 2 (two) times daily.     Lancets (ONETOUCH DELICA PLUS LANCET33G) MISC 1 Device by Does not apply route in the morning, at noon, and at bedtime. Use as directed to test blood sugar up to three times daily. E11.9 100 each 6   Omega-3 1000 MG CAPS Take 1 capsule (1,000 mg total) by mouth daily. 30 capsule 0   vitamin B-12 (CYANOCOBALAMIN) 100 MCG tablet Take 1 tablet (100 mcg total) by mouth daily. 30 tablet 0   amLODipine (NORVASC) 10 MG tablet Take 1 tablet (10 mg total) by mouth daily. 90 tablet 1   dapagliflozin propanediol (FARXIGA) 10 MG TABS tablet TAKE 1 TABLET(10 MG) BY MOUTH DAILY WITH BREAKFAST 90 tablet 1   gabapentin (NEURONTIN) 300 MG capsule TAKE 1 CAPSULE(300 MG) BY MOUTH AT BEDTIME 90 capsule 1   glipiZIDE (GLUCOTROL) 10 MG tablet Take 1 tablet (10 mg total) by mouth daily. 90 tablet 1   metFORMIN (GLUCOPHAGE) 500 MG tablet Take 2 tablets (1,000 mg total) by  mouth 2 (two) times daily with a meal. 360 tablet 1   rosuvastatin (CRESTOR) 20 MG tablet Take 1 tablet (20 mg total) by mouth daily. 90 tablet 1   ibuprofen (ADVIL) 600 MG tablet Take 1 tablet (600 mg total) by mouth every 6 (six) hours as needed. (Patient not taking: Reported on 12/23/2023) 30 tablet 0   naproxen (NAPROSYN) 500 MG tablet Take 1 tablet (500 mg total) by mouth 2 (two) times daily with a meal. (Patient not taking: Reported on 12/23/2023) 60 tablet 1   methocarbamol (ROBAXIN) 500 MG tablet Take 1 tablet (500 mg total) by mouth every 8 (eight) hours as needed for muscle spasms. (Patient not  taking: Reported on 12/23/2023) 90 tablet 0   No facility-administered medications prior to visit.     ROS Review of Systems  Constitutional:  Negative for activity change and appetite change.  HENT:  Negative for sinus pressure and sore throat.   Respiratory:  Negative for chest tightness, shortness of breath and wheezing.   Cardiovascular:  Negative for chest pain and palpitations.  Gastrointestinal:  Negative for abdominal distention, abdominal pain and constipation.  Genitourinary: Negative.   Musculoskeletal:        See HPI  Psychiatric/Behavioral:  Negative for behavioral problems and dysphoric mood.     Objective:  BP 132/85   Pulse (!) 102   Ht 5\' 3"  (1.6 m)   Wt 144 lb 9.6 oz (65.6 kg)   SpO2 97%   BMI 25.61 kg/m      12/23/2023    2:25 PM 10/21/2023   12:42 PM 04/15/2023    3:35 PM  BP/Weight  Systolic BP 132 154 127  Diastolic BP 85 92 77  Wt. (Lbs) 144.6 148.6 150.1  BMI 25.61 kg/m2 26.32 kg/m2 26.59 kg/m2      Physical Exam Constitutional:      Appearance: She is well-developed.  Cardiovascular:     Rate and Rhythm: Tachycardia present.     Heart sounds: Normal heart sounds. No murmur heard. Pulmonary:     Effort: Pulmonary effort is normal.     Breath sounds: Normal breath sounds. No wheezing or rales.  Chest:     Chest wall: No tenderness.  Abdominal:      General: Bowel sounds are normal. There is no distension.     Palpations: Abdomen is soft. There is no mass.     Tenderness: There is no abdominal tenderness.  Musculoskeletal:     Right lower leg: No edema.     Left lower leg: No edema.     Comments: Slightly tender right bunion.  Nontender left bunion No tenderness on range of motion of both knees  Neurological:     Mental Status: She is alert and oriented to person, place, and time.  Psychiatric:        Mood and Affect: Mood normal.        Latest Ref Rng & Units 08/20/2022    2:04 AM 08/06/2022    1:16 PM 07/16/2022    8:45 AM  CMP  Glucose 70 - 99 mg/dL 161  096  045   BUN 6 - 20 mg/dL 24  20  19    Creatinine 0.44 - 1.00 mg/dL 4.09  8.11  9.14   Sodium 135 - 145 mmol/L 140  141  136   Potassium 3.5 - 5.1 mmol/L 4.0  4.4  3.2   Chloride 98 - 111 mmol/L 104  106  88   CO2 22 - 32 mmol/L 24  29  26    Calcium 8.9 - 10.3 mg/dL 9.7  9.6  78.2   Total Protein 6.5 - 8.1 g/dL 7.2  7.4  8.0   Total Bilirubin 0.3 - 1.2 mg/dL 0.6  0.3  0.5   Alkaline Phos 38 - 126 U/L 67  71  105   AST 15 - 41 U/L 22  19  43   ALT 0 - 44 U/L 19  22  40     Lipid Panel     Component Value Date/Time   CHOL 185 10/10/2022 1520   TRIG 250 (H) 10/10/2022 1520   HDL 51 10/10/2022 1520   CHOLHDL  2.5 11/11/2020 0908   CHOLHDL 5.5 (H) 11/02/2016 1102   VLDL 80 (H) 11/02/2016 1102   LDLCALC 92 10/10/2022 1520    CBC    Component Value Date/Time   WBC 9.4 08/20/2022 0204   RBC 4.86 08/20/2022 0204   HGB 13.0 08/20/2022 0204   HGB 13.0 08/06/2022 1316   HCT 39.4 08/20/2022 0204   PLT 242 08/20/2022 0204   PLT 237 08/06/2022 1316   MCV 81.1 08/20/2022 0204   MCH 26.7 08/20/2022 0204   MCHC 33.0 08/20/2022 0204   RDW 14.1 08/20/2022 0204   LYMPHSABS 0.9 08/20/2022 0204   MONOABS 0.7 08/20/2022 0204   EOSABS 0.2 08/20/2022 0204   BASOSABS 0.0 08/20/2022 0204    Lab Results  Component Value Date   HGBA1C 8.6 (A) 12/23/2023        Assessment & Plan Bunion Significant pain in the right bunion exacerbated by standing and walking. - Refer to podiatrist for evaluation and management.  Knee Pain Left knee pain, possibly due to previous fall. - Recommend over-the-counter knee brace for support.  Type 2 Diabetes Mellitus Diabetes uncontrolled with HbA1c of 8.6%. - Increase metformin to three tablets in the morning and two in the evening, continue Farxiga and glipizide - Advise on lifestyle modifications to manage stress-related eating.  Hypertension Blood pressure well controlled with current medications. - Continue current antihypertensive medications. -Counseled on blood pressure goal of less than 130/80, low-sodium, DASH diet, medication compliance, 150 minutes of moderate intensity exercise per week. Discussed medication compliance, adverse effects.   Hyperlipidemia Cholesterol levels need checking; fasting required. - Schedule fasting blood test for cholesterol levels.  Breast Cancer (Stage 3B, Triple Negative with 10% ER Positive) Status post lumpectomy, neoadjuvant chemotherapy, and radiation. - Continue follow-up with oncology. -Mammogram and ultrasound ordered by oncology is pending.      Meds ordered this encounter  Medications   metFORMIN (GLUCOPHAGE) 500 MG tablet    Sig: Take orally 3 tabs (1500mg ) in the morning and 2 tabs (1000mg ) in the evening    Dispense:  360 tablet    Refill:  1   amLODipine (NORVASC) 10 MG tablet    Sig: Take 1 tablet (10 mg total) by mouth daily.    Dispense:  90 tablet    Refill:  1   dapagliflozin propanediol (FARXIGA) 10 MG TABS tablet    Sig: TAKE 1 TABLET(10 MG) BY MOUTH DAILY WITH BREAKFAST    Dispense:  90 tablet    Refill:  1   gabapentin (NEURONTIN) 300 MG capsule    Sig: Take 1 capsule (300 mg total) by mouth at bedtime.    Dispense:  90 capsule    Refill:  1   glipiZIDE (GLUCOTROL) 10 MG tablet    Sig: Take 1 tablet (10 mg total) by mouth  daily.    Dispense:  90 tablet    Refill:  1    Dose increase   methocarbamol (ROBAXIN) 500 MG tablet    Sig: Take 1 tablet (500 mg total) by mouth every 8 (eight) hours as needed for muscle spasms.    Dispense:  90 tablet    Refill:  1   rosuvastatin (CRESTOR) 20 MG tablet    Sig: Take 1 tablet (20 mg total) by mouth daily.    Dispense:  90 tablet    Refill:  1    **Patient requests 90 days supply**    Follow-up: Return in about 3 months (around 03/24/2024) for Chronic medical  conditions.       [name], MD, FAAFP. [facility] [place], [place] [phone]   12/23/2023, 5:35 PM

## 2023-12-24 ENCOUNTER — Ambulatory Visit: Attending: Family Medicine

## 2023-12-24 DIAGNOSIS — E119 Type 2 diabetes mellitus without complications: Secondary | ICD-10-CM

## 2023-12-26 LAB — CMP14+EGFR
ALT: 27 IU/L (ref 0–32)
AST: 23 IU/L (ref 0–40)
Albumin: 4.8 g/dL (ref 3.8–4.9)
Alkaline Phosphatase: 89 IU/L (ref 44–121)
BUN/Creatinine Ratio: 26 — ABNORMAL HIGH (ref 9–23)
BUN: 19 mg/dL (ref 6–24)
Bilirubin Total: 0.4 mg/dL (ref 0.0–1.2)
CO2: 26 mmol/L (ref 20–29)
Calcium: 9.8 mg/dL (ref 8.7–10.2)
Chloride: 91 mmol/L — ABNORMAL LOW (ref 96–106)
Creatinine, Ser: 0.72 mg/dL (ref 0.57–1.00)
Globulin, Total: 2.7 g/dL (ref 1.5–4.5)
Glucose: 151 mg/dL — ABNORMAL HIGH (ref 70–99)
Potassium: 3.5 mmol/L (ref 3.5–5.2)
Sodium: 137 mmol/L (ref 134–144)
Total Protein: 7.5 g/dL (ref 6.0–8.5)
eGFR: 99 mL/min/{1.73_m2} (ref >59)

## 2023-12-26 LAB — LP+NON-HDL CHOLESTEROL
Cholesterol, Total: 284 mg/dL — ABNORMAL HIGH (ref 100–199)
HDL: 46 mg/dL (ref >39)
LDL Chol Calc (NIH): 160 mg/dL — ABNORMAL HIGH (ref 0–99)
Total Non-HDL-Chol (LDL+VLDL): 238 mg/dL — ABNORMAL HIGH (ref 0–129)
Triglycerides: 406 mg/dL — ABNORMAL HIGH (ref 0–149)
VLDL Cholesterol Cal: 78 mg/dL — ABNORMAL HIGH (ref 5–40)

## 2023-12-26 LAB — MICROALBUMIN / CREATININE URINE RATIO
Creatinine, Urine: 68.7 mg/dL
Microalb/Creat Ratio: 14 mg/g{creat} (ref 0–29)
Microalbumin, Urine: 9.8 ug/mL

## 2023-12-27 ENCOUNTER — Other Ambulatory Visit: Payer: Self-pay | Admitting: Family Medicine

## 2023-12-27 DIAGNOSIS — E1169 Type 2 diabetes mellitus with other specified complication: Secondary | ICD-10-CM

## 2023-12-27 MED ORDER — ROSUVASTATIN CALCIUM 40 MG PO TABS: 40.0000 mg | ORAL_TABLET | Freq: Every day | ORAL | 1 refills | Status: DC

## 2024-01-06 ENCOUNTER — Ambulatory Visit (INDEPENDENT_AMBULATORY_CARE_PROVIDER_SITE_OTHER): Admitting: Podiatry

## 2024-01-06 ENCOUNTER — Ambulatory Visit (INDEPENDENT_AMBULATORY_CARE_PROVIDER_SITE_OTHER)

## 2024-01-06 ENCOUNTER — Encounter: Payer: Self-pay | Admitting: Podiatry

## 2024-01-06 DIAGNOSIS — M79671 Pain in right foot: Secondary | ICD-10-CM

## 2024-01-06 DIAGNOSIS — M722 Plantar fascial fibromatosis: Secondary | ICD-10-CM | POA: Diagnosis not present

## 2024-01-06 DIAGNOSIS — M21619 Bunion of unspecified foot: Secondary | ICD-10-CM

## 2024-01-06 DIAGNOSIS — M79672 Pain in left foot: Secondary | ICD-10-CM

## 2024-01-06 MED ORDER — MELOXICAM 7.5 MG PO TABS: 7.5000 mg | ORAL_TABLET | Freq: Every day | ORAL | 0 refills | Status: DC | PRN

## 2024-01-06 NOTE — Patient Instructions (Signed)
 ????? ??????? ??? ?????? ??????? ????????  Plantar Fasciitis [facility] ????? ???? ??????? ?????? ?? ???????? ?????? ??????? ??. ???? ???? ????????? ???????? ??? ??????? ???? ??????? ?????? ??????? ?????? ??? ?????????. ???? ??? ??????? ?? ???? ???? ???? ?? ?????? ?? ????? ?? ???????? ?? ??? ?????? ?? ????? ?????? ??? ????????. ??? ???? ????? ??? ???? ???? ????? ?? ?????? ??? ????. ?? ???? ?? ?????? ??? ???????? ??? ?? ????? ???? ??????? ?????? ??? ?????? ???. ??????? ?????? ???????? ????? ?????? ???? ??? ???????? ??? ????? ??????? ???????? ?????? ????? ??? ????? ???? ????????. ??? ???? ????? ?? ????? ?????. ????? ??????? ????????  1. ???? ?? ??? ????? ?????? / ?????? ??? ?????? ????????. 2. ???? ???? ??? ????? ??? ?? ???? ????? ??? ???? ?????? ?? ????? ?????? ?? ???? ?????. ????? ??????? ???? ????? ????? ??????? ???? ????? ??? ???? ????. ?? ??????? ?? ???? ????? ?? ????? (????? ??????) ????? ????? ?? ????? ?????? ?? ???? (??????? ????????) ?? ??????. 3. ???? ??? ??? ?????? ??? ???? _________ (?????) ?????. 4. ???? ????? ???? ???? ?? ??? ??? ??? ?????. ??? ??????? ___________ (???) ????. ??? ??? ??????? ____________ (???) ???? ??????. ????? ???? ????? ?? ??? ??????  ????? ??? ??????? ????? ????? ???? ?????. ???? ??????? ???? ??? ????? ????? ????? ???? ????? ?? ????. 1. ?? ??????? ????? ??? ??????. 2. ?? ???? ?????? / ?????? ???? ?? ??? ?????? ?? ?????? ??????. 3. ???? ????? ??? ????? ?? ?????? ????? ????? ?????? ???????? ?????? ?? ????? ?????? ??? ???? ?????? ?? ??? ??????. ?? ???? ???? ?????. ?? ??????? ?? ???? ????? ???? ?? ???? ???? ?????. 4. ???? ??? ??? ????? ???? ____ (?????) ?????. ??? ??????? ___________ (???) ????. ??? ??? ??????? ____________ (???) ???? ??????. ????? ?????? ???????? ?? ??? ?????? ????? ??? ??????? ????? ????? ???? ????? (?????? ???????). ???? ??????? ???? ??? ????? ????? ????? ???? ????? ?? ????. 1. ?? ??????? ????? ??? ??????. 2. ?? ???? ?????? / ?????? ???? ?? ???  ?????? ?? ?????? ??????. 3. ???? ????? ??? ????? ?? ?????? ????? ????? ??????? ?? ???? ????? ?? ????? ?????? ??? ???? ?????? ?????? ?? ??? ???? ?? ?????. ??? ?? ???? ????? ???? ?? ???? ???? ?????. 4. ???? ??? ??? ????? ???? ____ (?????) ?????. ??? ??????? ___________ (???) ????. ??? ??? ??????? ____________ (???) ???? ??????. ????? ???? ????? ????????? ??????? ??? ??? ?? ??? ?????? ???? ??? ??????? ????? ???? ????? ?? ?????. ????? ??? ????? ???? ???? ????? (???? ?????) ??????? (?????? ???????). 1. ?? ??????? ?????? ???? ?????? / ?????? ??? ???? ???? ????? ?????. ?????? ????? ?? ??? ?????? ???? ???? ???? ?? ???? ???? ???? ???? ???? ????? ???? ??????. 2. ??? ???? ?????? ??? ???? ????? ?????. 3. ???? ??????? ?? ????????? ?????? ??? ??????. 4. ???? ???? ?????? ???? ?? ?????? ???? ???? ?????? ??? ????? ??? ???? ???? ????? ?????. ???? ??? ??????? ?????? ???? ?????. ?? ??????? ?? ???? ????? ???? ?? ???? ?????. 5. ???? ??? ??? ????? ???? ____ (?????) ?????. 6. ??? ??? ??????? ??? ???? ?????. 7. ??? ??? ??????? ?? ??? ????? ?????? / ?????? ??????. ??? __________ ???? ??????? ?????? / ?????? ?? ??? ?????? ?__________ ???? ?? ??? ?????? ?????? / ??????. ??? ??? ??????? ____________ (???) ???? ??????. ????? ??????? ????? ??? ??????? ?????? ??? ??????? ????? ????? ?? ????? ?????? ?? ???? ????? ???????? ?? ????? ????? ??? ??????? ????????. ?????? ?? ???????? ??? ??? ??????? ??? ??? ??? ??????? ????? ???? ????? ?????? ?????? ?? ????? ????? ?? ?????? ??? ?????.  1. ?? ?????? ?????? ?? ??????? ?? ?? ????. ?????? ??????? ?????????? ?? ???? ????? ??? ??????. 2. ?? ??? ???? ?????? / ??????. ????? ?? ????? ????? ????? ?????? ????? ??? ????? ?? ?????? ??? ??? ???? ?????. ?? ??????? ?? ???? ????? ?? ???? ????? ?????? ?????? ????. ?? ??? ???? ????? ??? ??????. 3. ???? ??? ??? ????? ???? ____ (?????) ?????. ??? ??????? ___________ (???) ????. ??? ??? ??????? ____________ (???) ???? ??????. ??? ????? ?? ??? ????????? ??  ???? ?????? ????????? ???? ?????? ???? ??????? ??????. ???? ?? ?????? ??? ????? ???? ?? ???? ?? ???? ??????? ??????.? Document Revised: 08/19/2020 Document Reviewed: 08/19/2020 Elsevier Patient Education  2024 [employer]   --  You can try a B-COMPLEX VITAMIN and ALPHA-LIPOIC ACID

## 2024-01-06 NOTE — Progress Notes (Unsigned)
 Subjective:   Patient ID: Rebecca [name], female   DOB: 58 y.o.   MRN: [id number]   HPI Chief Complaint  Patient presents with   Foot Pain    RM#14 Bilateral foot pain for 6 months around bunion area.   58 year old female presents the office today with above concerns.  She gets pain to both of her feet for the last 6 months and over the bunion area as well.  She feels that she gets a hot sensation in the arch of the foot.  She is currently on gabapentin.  She does do a lot of standing at work and hurts for the last several hours of work with standing.  Right side is worse than the left nailbed abrasion started in the left foot.  She did have any recent injuries.  No other treatment except for what is listed.  Last A1c was 8.6 on December 23, 2023   Review of Systems  All other systems reviewed and are negative.  Past Medical History:  Diagnosis Date   Arthritis    Breast cancer (HCC)    Diabetes mellitus    Family history of lymphoma 01/18/2021   Family history of throat cancer 01/18/2021   Hyperlipidemia    Hypertension     Past Surgical History:  Procedure Laterality Date   BREAST LUMPECTOMY WITH RADIOACTIVE SEED AND SENTINEL LYMPH NODE BIOPSY Right 07/11/2021   Procedure: RIGHT BREAST LUMPECTOMY WITH RADIOACTIVE SEED AND RIGHT SENTINEL LYMPH NODE BIOPSY;  Surgeon: Harriette Bouillon, MD;  Location: [facility];  Service: General;  Laterality: Right;   CESAREAN SECTION     PORT-A-CATH REMOVAL Right 05/22/2022   Procedure: REMOVAL PORT-A-CATH;  Surgeon: Harriette Bouillon, MD;  Location: [facility];  Service: General;  Laterality: Right;   PORTACATH PLACEMENT N/A 01/26/2021   Procedure: INSERTION PORT-A-CATH;  Surgeon: Harriette Bouillon, MD;  Location: [facility];  Service: General;  Laterality: N/A;  60   RADIOACTIVE SEED GUIDED AXILLARY SENTINEL LYMPH NODE Right 07/11/2021   Procedure: RADIOACTIVE SEED GUIDED RIGHT AXILLARY LYMPH NODE BIOPSY;  Surgeon: Harriette Bouillon, MD;  Location: [facility];  Service: General;  Laterality: Right;     Current Outpatient Medications:    acetaminophen (TYLENOL) 500 MG tablet, Take 1 tablet (500 mg total) by mouth 3 (three) times daily as needed (with one aleve tablet)., Disp: 30 tablet, Rfl: 0   amLODipine (NORVASC) 10 MG tablet, Take 1 tablet (10 mg total) by mouth daily., Disp: 90 tablet, Rfl: 1   Ascorbic Acid (VITAMIN C) 1000 MG tablet, Take 1 tablet (1,000 mg total) by mouth daily., Disp: 30 tablet, Rfl: 0   Blood Glucose Monitoring Suppl (ONETOUCH VERIO REFLECT) w/Device KIT, 1 kit by Does not apply route in the morning, at noon, and at bedtime. Use as directed to test blood sugar up to three times daily.E11.9, Disp: 1 kit, Rfl: 0   dapagliflozin propanediol (FARXIGA) 10 MG TABS tablet, TAKE 1 TABLET(10 MG) BY MOUTH DAILY WITH BREAKFAST, Disp: 90 tablet, Rfl: 1   ergocalciferol (DRISDOL) 1.25 MG (50000 UT) capsule, Take 1 capsule (50,000 Units total) by mouth once a week., Disp: 12 capsule, Rfl: 1   gabapentin (NEURONTIN) 300 MG capsule, Take 1 capsule (300 mg total) by mouth at bedtime., Disp: 90 capsule, Rfl: 1   glipiZIDE (GLUCOTROL) 10 MG tablet, Take 1 tablet (10 mg total) by mouth daily., Disp: 90 tablet, Rfl: 1   glucose blood (ONETOUCH VERIO) test strip, Use as  directed to test blood sugar up to three times daily. E11.9, Disp: 100 each, Rfl: 6   Glucose Blood (TRUE METRIX BLOOD GLUCOSE TEST VI), See admin instructions., Disp: , Rfl:    glucose blood test strip, See admin instructions., Disp: , Rfl:    labetalol (NORMODYNE) 100 MG tablet, Take 100 mg by mouth 2 (two) times daily., Disp: , Rfl:    Lancets (ONETOUCH DELICA PLUS LANCET33G) MISC, 1 Device by Does not apply route in the morning, at noon, and at bedtime. Use as directed to test blood sugar up to three times daily. E11.9, Disp: 100 each, Rfl: 6   meloxicam (MOBIC) 7.5 MG tablet, Take 1 tablet (7.5 mg total) by mouth daily as needed  for pain., Disp: 30 tablet, Rfl: 0   metFORMIN (GLUCOPHAGE) 500 MG tablet, Take orally 3 tabs (1500mg ) in the morning and 2 tabs (1000mg ) in the evening, Disp: 360 tablet, Rfl: 1   methocarbamol (ROBAXIN) 500 MG tablet, Take 1 tablet (500 mg total) by mouth every 8 (eight) hours as needed for muscle spasms., Disp: 90 tablet, Rfl: 1   naproxen (NAPROSYN) 500 MG tablet, Take 1 tablet (500 mg total) by mouth 2 (two) times daily with a meal., Disp: 60 tablet, Rfl: 1   Omega-3 1000 MG CAPS, Take 1 capsule (1,000 mg total) by mouth daily., Disp: 30 capsule, Rfl: 0   rosuvastatin (CRESTOR) 40 MG tablet, Take 1 tablet (40 mg total) by mouth daily., Disp: 90 tablet, Rfl: 1   vitamin B-12 (CYANOCOBALAMIN) 100 MCG tablet, Take 1 tablet (100 mcg total) by mouth daily., Disp: 30 tablet, Rfl: 0  Allergies  Allergen Reactions   Lisinopril Swelling    Swelling in throat, coughing, difficulty swallowing, could not sleep. Occurred Dec 2020.   Heparin Sod Chief Technology Officer) Lock Flush Other (See Comments)    Pork derived product           Objective:  Physical Exam  General: AAO x3, NAD  Dermatological: Skin is warm, dry and supple bilateral. There are no open sores, no preulcerative lesions, no rash or signs of infection present.  Vascular: Dorsalis Pedis artery and Posterior Tibial artery pedal pulses are 2/4 bilateral with immedate capillary fill time. There is no pain with calf compression, swelling, warmth, erythema.   Neruologic: Grossly intact via light touch bilateral.   Musculoskeletal: She has tenderness palpation of the arch of the foot on the [place] plantar fascia.  Not able to appreciate any area pinpoint tenderness.  Bunion deformity noted bilaterally with mild discomfort in the area.  Also, extensor tendons appear to be intact.  MMT 5/5.  Decreased medial arch height.  Gait: Unassisted, Nonantalgic.       Assessment:   58 year old female bilateral foot pain likely result of plantar fasciitis,  bunion also complains of neuropathy     Plan:  -Treatment options discussed including all alternatives, risks, and complications -Etiology of symptoms were discussed. -X-rays were obtained and reviewed with the patient.  3 views bilateral feet were obtained.  Significant bunion deformities present.  Calcaneal spurring is present.  No evidence of acute fracture. -I discussed the bunion discussed the conservative as well as surgical options.  She will continue conservative management for now.  Dispensed offloading pads. -Discussed traction, icing daily.  Dispensed power step inserts that provide better support.  Discussed shoes with good support as well. -Prescribed mobic. Discussed side effects of the medication and directed to stop if any are to occur and call the office.  -  Continue gabapentin for neuropathy.     Return in about 2 months (around 03/07/2024) for foot pain.  Vivi Barrack DPM

## 2024-02-02 ENCOUNTER — Other Ambulatory Visit: Payer: Self-pay | Admitting: Family Medicine

## 2024-02-02 ENCOUNTER — Other Ambulatory Visit: Payer: Self-pay | Admitting: Podiatry

## 2024-03-09 ENCOUNTER — Ambulatory Visit: Admitting: Podiatry

## 2024-03-23 ENCOUNTER — Ambulatory Visit: Admitting: Family Medicine

## 2024-03-23 DIAGNOSIS — E119 Type 2 diabetes mellitus without complications: Secondary | ICD-10-CM

## 2024-03-25 ENCOUNTER — Other Ambulatory Visit

## 2024-04-21 ENCOUNTER — Telehealth: Payer: Self-pay

## 2024-04-21 NOTE — Telephone Encounter (Signed)
 Left message confirming appt for 7/2

## 2024-04-22 ENCOUNTER — Inpatient Hospital Stay: Payer: BLUE CROSS/BLUE SHIELD | Attending: Hematology and Oncology | Admitting: Hematology and Oncology

## 2024-06-17 ENCOUNTER — Other Ambulatory Visit: Payer: Self-pay | Admitting: Family Medicine

## 2024-06-17 DIAGNOSIS — E1159 Type 2 diabetes mellitus with other circulatory complications: Secondary | ICD-10-CM

## 2024-06-17 DIAGNOSIS — M791 Myalgia, unspecified site: Secondary | ICD-10-CM

## 2024-06-17 DIAGNOSIS — E119 Type 2 diabetes mellitus without complications: Secondary | ICD-10-CM

## 2024-06-20 ENCOUNTER — Other Ambulatory Visit: Payer: Self-pay | Admitting: Family Medicine

## 2024-06-20 DIAGNOSIS — E1169 Type 2 diabetes mellitus with other specified complication: Secondary | ICD-10-CM

## 2024-08-02 ENCOUNTER — Other Ambulatory Visit: Payer: Self-pay | Admitting: Family Medicine

## 2024-08-02 DIAGNOSIS — E119 Type 2 diabetes mellitus without complications: Secondary | ICD-10-CM

## 2024-08-10 ENCOUNTER — Ambulatory Visit (INDEPENDENT_AMBULATORY_CARE_PROVIDER_SITE_OTHER): Payer: Self-pay | Admitting: Nurse Practitioner

## 2024-08-10 ENCOUNTER — Encounter: Payer: Self-pay | Admitting: Nurse Practitioner

## 2024-08-10 VITALS — BP 135/78 | HR 82 | Ht 63.0 in | Wt 141.0 lb

## 2024-08-10 DIAGNOSIS — M2550 Pain in unspecified joint: Secondary | ICD-10-CM | POA: Diagnosis not present

## 2024-08-10 DIAGNOSIS — M79672 Pain in left foot: Secondary | ICD-10-CM

## 2024-08-10 DIAGNOSIS — E119 Type 2 diabetes mellitus without complications: Secondary | ICD-10-CM

## 2024-08-10 DIAGNOSIS — M791 Myalgia, unspecified site: Secondary | ICD-10-CM

## 2024-08-10 DIAGNOSIS — Z853 Personal history of malignant neoplasm of breast: Secondary | ICD-10-CM

## 2024-08-10 DIAGNOSIS — N644 Mastodynia: Secondary | ICD-10-CM

## 2024-08-10 DIAGNOSIS — M79671 Pain in right foot: Secondary | ICD-10-CM

## 2024-08-10 LAB — POCT GLYCOSYLATED HEMOGLOBIN (HGB A1C): Hemoglobin A1C: 8.3 % — AB (ref 4.0–5.6)

## 2024-08-10 MED ORDER — METHOCARBAMOL 500 MG PO TABS: 500.0000 mg | ORAL_TABLET | Freq: Three times a day (TID) | ORAL | 0 refills | Status: AC | PRN

## 2024-08-10 MED ORDER — PREDNISONE 20 MG PO TABS: 20.0000 mg | ORAL_TABLET | Freq: Every day | ORAL | 0 refills | Status: AC

## 2024-08-10 MED ORDER — GLIPIZIDE 10 MG PO TABS: 10.0000 mg | ORAL_TABLET | Freq: Every day | ORAL | 0 refills | Status: DC

## 2024-08-10 MED ORDER — METFORMIN HCL 500 MG PO TABS: ORAL_TABLET | ORAL | 1 refills | Status: DC

## 2024-08-10 NOTE — Progress Notes (Signed)
 Subjective   Patient ID: Rebecca [name], female    DOB: 23-May-1966, 58 y.o.   MRN: [id number]  Chief Complaint  Patient presents with   Medical Management of Chronic Issues   Breast Pain    Right side, present a week    Referring provider: Delbert Clam, MD  Rebecca [name] is a 58 y.o. female with Past Medical History: No date: Arthritis No date: Breast cancer (HCC) No date: Diabetes mellitus 01/18/2021: Family history of lymphoma 01/18/2021: Family history of throat cancer No date: Hyperlipidemia No date: Hypertension   HPI  Patient presents today for an acute visit.  This is a previous patient of Dr. Newlin.  Patient has a history of breast cancer.  She is concerned because she has been having right sided breast pain and states that she has felt a lump.  Upon breast exam today there was no lump noted.  But we will order diagnostic mammogram due to history and pain.  We discussed that she does need to follow-up with oncology as well.  Patient states that she does have chronic pain throughout her body with multiple joint pain.  We will place a referral for her to pain management and check autoimmune markers today.  A1c in office was 8.3.  This is elevated but is improved from last check. Denies f/c/s, n/v/d, hemoptysis, PND, leg swelling Denies chest pain or edema        Allergies  Allergen Reactions   Lisinopril  Swelling    Swelling in throat, coughing, difficulty swallowing, could not sleep. Occurred Dec 2020.   Heparin  Sod [employer]) Lock Flush Other (See Comments)    Pork derived product    Immunization History  Administered Date(s) Administered   PFIZER Comirnaty(Gray Top)Covid-19 Tri-Sucrose Vaccine 01/18/2021   PFIZER(Purple Top)SARS-COV-2 Vaccination 07/23/2020, 08/18/2020   Pneumococcal Polysaccharide-23 06/12/2018    Tobacco History: Social History   Tobacco Use  Smoking Status Never   Passive exposure: Yes  Smokeless Tobacco Never    Counseling given: Not Answered   Outpatient Encounter Medications as of 08/10/2024  Medication Sig   acetaminophen  (TYLENOL ) 500 MG tablet Take 1 tablet (500 mg total) by mouth 3 (three) times daily as needed (with one aleve  tablet).   amLODipine  (NORVASC ) 10 MG tablet TAKE 1 TABLET(10 MG) BY MOUTH DAILY   Ascorbic Acid (VITAMIN C ) 1000 MG tablet Take 1 tablet (1,000 mg total) by mouth daily.   Blood Glucose Monitoring Suppl (ONETOUCH VERIO REFLECT) w/Device KIT 1 kit by Does not apply route in the morning, at noon, and at bedtime. Use as directed to test blood sugar up to three times daily.E11.9   dapagliflozin  propanediol (FARXIGA ) 10 MG TABS tablet TAKE 1 TABLET(10 MG) BY MOUTH DAILY WITH BREAKFAST   ergocalciferol  (DRISDOL ) 1.25 MG (50000 UT) capsule Take 1 capsule (50,000 Units total) by mouth once a week.   gabapentin  (NEURONTIN ) 300 MG capsule Take 1 capsule (300 mg total) by mouth at bedtime.   glucose blood (ONETOUCH VERIO) test strip Use as directed to test blood sugar up to three times daily. E11.9   Glucose Blood (TRUE METRIX BLOOD GLUCOSE TEST VI) See admin instructions.   glucose blood test strip See admin instructions.   labetalol  (NORMODYNE ) 100 MG tablet Take 100 mg by mouth 2 (two) times daily.   Lancets (ONETOUCH DELICA PLUS LANCET33G) MISC 1 Device by Does not apply route in the morning, at noon, and at bedtime. Use as directed to test blood sugar up to three  times daily. E11.9   meloxicam  (MOBIC ) 7.5 MG tablet TAKE 1 TABLET(7.5 MG) BY MOUTH DAILY AS NEEDED FOR PAIN   naproxen  (NAPROSYN ) 500 MG tablet Take 1 tablet (500 mg total) by mouth 2 (two) times daily with a meal.   Omega-3 1000 MG CAPS Take 1 capsule (1,000 mg total) by mouth daily.   predniSONE  (DELTASONE ) 20 MG tablet Take 1 tablet (20 mg total) by mouth daily with breakfast.   rosuvastatin  (CRESTOR ) 40 MG tablet TAKE 1 TABLET(40 MG) BY MOUTH DAILY   vitamin B-12 (CYANOCOBALAMIN) 100 MCG tablet Take 1 tablet  (100 mcg total) by mouth daily.   [DISCONTINUED] glipiZIDE  (GLUCOTROL ) 10 MG tablet TAKE 1 TABLET(10 MG) BY MOUTH DAILY   [DISCONTINUED] metFORMIN  (GLUCOPHAGE ) 500 MG tablet Take orally 3 tabs (1500mg ) in the morning and 2 tabs (1000mg ) in the evening   [DISCONTINUED] methocarbamol  (ROBAXIN ) 500 MG tablet TAKE 1 TABLET(500 MG) BY MOUTH EVERY 8 HOURS AS NEEDED FOR MUSCLE SPASMS   glipiZIDE  (GLUCOTROL ) 10 MG tablet Take 1 tablet (10 mg total) by mouth daily before breakfast. TAKE 1 TABLET(10 MG) BY MOUTH DAILY   metFORMIN  (GLUCOPHAGE ) 500 MG tablet Take orally 3 tabs (1500mg ) in the morning and 2 tabs (1000mg ) in the evening   methocarbamol  (ROBAXIN ) 500 MG tablet Take 1 tablet (500 mg total) by mouth every 8 (eight) hours as needed for muscle spasms.   No facility-administered encounter medications on file as of 08/10/2024.    Review of Systems  Review of Systems  Constitutional: Negative.   HENT: Negative.    Cardiovascular: Negative.   Gastrointestinal: Negative.   Allergic/Immunologic: Negative.   Neurological: Negative.   Psychiatric/Behavioral: Negative.       Objective:   BP 135/78   Pulse 82   Ht 5' 3 (1.6 m)   Wt 141 lb (64 kg)   BMI 24.98 kg/m   Wt Readings from Last 5 Encounters:  08/10/24 141 lb (64 kg)  12/23/23 144 lb 9.6 oz (65.6 kg)  10/21/23 148 lb 9.6 oz (67.4 kg)  04/15/23 150 lb 1.6 oz (68.1 kg)  03/25/23 152 lb 3.2 oz (69 kg)     Physical Exam Vitals and nursing note reviewed. Exam conducted with a chaperone present.  Constitutional:      General: She is not in acute distress.    Appearance: She is well-developed.  Cardiovascular:     Rate and Rhythm: Normal rate and regular rhythm.  Pulmonary:     Effort: Pulmonary effort is normal.     Breath sounds: Normal breath sounds.  Chest:  Breasts:    Right: Tenderness present.     Left: Normal.  Neurological:     Mental Status: She is alert and oriented to person, place, and time.        Assessment & Plan:   Breast pain, right -     MM 3D DIAGNOSTIC MAMMOGRAM UNILATERAL RIGHT BREAST  Type 2 diabetes mellitus without complication, without long-term current use of insulin  (HCC) -     glipiZIDE ; Take 1 tablet (10 mg total) by mouth daily before breakfast. TAKE 1 TABLET(10 MG) BY MOUTH DAILY  Dispense: 30 tablet; Refill: 0 -     POCT glycosylated hemoglobin (Hb A1C) -     metFORMIN  HCl; Take orally 3 tabs (1500mg ) in the morning and 2 tabs (1000mg ) in the evening  Dispense: 360 tablet; Refill: 1  History of breast cancer -     MM 3D DIAGNOSTIC MAMMOGRAM UNILATERAL RIGHT BREAST  Multiple joint pain -     ANA -     C-reactive protein -     Rheumatoid factor -     Sedimentation rate -     Ambulatory referral to Pain Clinic -     predniSONE ; Take 1 tablet (20 mg total) by mouth daily with breakfast.  Dispense: 5 tablet; Refill: 0  Bilateral foot pain -     Ambulatory referral to Podiatry  Muscle pain -     Methocarbamol ; Take 1 tablet (500 mg total) by mouth every 8 (eight) hours as needed for muscle spasms.  Dispense: 90 tablet; Refill: 0     Return if symptoms worsen or fail to improve.   Bascom GORMAN Borer, NP 08/10/2024

## 2024-08-11 LAB — RHEUMATOID FACTOR: Rheumatoid fact SerPl-aCnc: <10 [IU]/mL (ref <14.0)

## 2024-08-11 LAB — ANA: Anti Nuclear Antibody (ANA): NEGATIVE

## 2024-08-11 LAB — SEDIMENTATION RATE: Sed Rate: 25 mm/h (ref 0–40)

## 2024-08-11 LAB — C-REACTIVE PROTEIN: CRP: 6 mg/L (ref 0–10)

## 2024-08-12 ENCOUNTER — Ambulatory Visit: Payer: Self-pay | Admitting: Nurse Practitioner

## 2024-08-19 ENCOUNTER — Encounter

## 2024-08-24 ENCOUNTER — Telehealth: Payer: Self-pay

## 2024-08-24 DIAGNOSIS — N644 Mastodynia: Secondary | ICD-10-CM

## 2024-08-24 DIAGNOSIS — Z853 Personal history of malignant neoplasm of breast: Secondary | ICD-10-CM

## 2024-08-24 NOTE — Telephone Encounter (Signed)
 Patient walked in stating they are needing the mammogram referral sent to [facility]. Phone #[phone] and Fax#: [phone].  [facility] does not accept the patients insurance but [facility] does.

## 2024-08-25 NOTE — Telephone Encounter (Signed)
 Order has been placed.

## 2024-08-26 ENCOUNTER — Other Ambulatory Visit: Payer: Self-pay

## 2024-08-26 DIAGNOSIS — N644 Mastodynia: Secondary | ICD-10-CM

## 2024-08-26 NOTE — Telephone Encounter (Signed)
 Done

## 2024-08-26 NOTE — Telephone Encounter (Signed)
 Maybe in't is under the PEC since the call was transferred from the [facility] . Feel free to place a new order and I can sign it when I come into Clinic tomorrow. Thanks

## 2024-08-26 NOTE — Telephone Encounter (Signed)
 The order that has been placed is not under your name, I'm not sure if that will interrupt the results.

## 2024-08-27 NOTE — Telephone Encounter (Signed)
 Order has been faxed to number provided .

## 2024-09-08 ENCOUNTER — Other Ambulatory Visit: Payer: Self-pay | Admitting: Nurse Practitioner

## 2024-09-08 DIAGNOSIS — E119 Type 2 diabetes mellitus without complications: Secondary | ICD-10-CM

## 2024-09-11 ENCOUNTER — Telehealth: Payer: Self-pay | Admitting: Family Medicine

## 2024-09-11 ENCOUNTER — Telehealth: Payer: Self-pay

## 2024-09-11 DIAGNOSIS — E1169 Type 2 diabetes mellitus with other specified complication: Secondary | ICD-10-CM

## 2024-09-11 DIAGNOSIS — E119 Type 2 diabetes mellitus without complications: Secondary | ICD-10-CM

## 2024-09-11 DIAGNOSIS — E1159 Type 2 diabetes mellitus with other circulatory complications: Secondary | ICD-10-CM

## 2024-09-11 DIAGNOSIS — G620 Drug-induced polyneuropathy: Secondary | ICD-10-CM

## 2024-09-11 MED ORDER — ROSUVASTATIN CALCIUM 40 MG PO TABS: 40.0000 mg | ORAL_TABLET | Freq: Every day | ORAL | 1 refills | Status: AC

## 2024-09-11 MED ORDER — DAPAGLIFLOZIN PROPANEDIOL 10 MG PO TABS: ORAL_TABLET | ORAL | 1 refills | Status: AC

## 2024-09-11 MED ORDER — GABAPENTIN 300 MG PO CAPS: 300.0000 mg | ORAL_CAPSULE | Freq: Every day | ORAL | 1 refills | Status: AC

## 2024-09-11 MED ORDER — METFORMIN HCL 500 MG PO TABS: ORAL_TABLET | ORAL | 1 refills | Status: AC

## 2024-09-11 MED ORDER — AMLODIPINE BESYLATE 10 MG PO TABS: 10.0000 mg | ORAL_TABLET | Freq: Every day | ORAL | 1 refills | Status: AC

## 2024-09-11 MED ORDER — GLIPIZIDE ER 10 MG PO TB24
10.0000 mg | ORAL_TABLET | Freq: Every day | ORAL | 1 refills | Status: AC
Start: 2024-09-11 — End: ?

## 2024-09-11 NOTE — Telephone Encounter (Signed)
 Noted

## 2024-09-11 NOTE — Telephone Encounter (Signed)
 Noted has already been sent to PCP

## 2024-09-11 NOTE — Telephone Encounter (Signed)
 Prescriptions have been sent to her pharmacy on file.

## 2024-09-11 NOTE — Telephone Encounter (Addendum)
 Patient came in the office today requesting medication refills for all her medication including glipiZIDE  (GLUCOTROL ) 10 MG tablet and high blood pressure  medication. Please advise.

## 2024-09-11 NOTE — Telephone Encounter (Signed)
 Patient presented to the office today requesting refills for all current medications.Patient was last seen by Bascom Borer on 08/10/2024. I advised the patient that, since refills are needed for all medications, I will need to contact her PCP to confirm that all required labs for medication renewal have been completed. If labs are not up to date, we will need to schedule an appointment for refills. Patient verbalized understanding but requested that this matter be addressed as soon as possible, as she is currently out of several medications. Routing to provider for advisement.

## 2024-10-29 ENCOUNTER — Telehealth: Payer: Self-pay | Admitting: Family Medicine

## 2024-10-29 NOTE — Telephone Encounter (Signed)
 Thank you for making me aware.  She did not show up for an appointment with Dr. Loretha in 04/2024.  Is she willing to return?  I am happy to help arrange getting her rescheduled if so.

## 2024-10-29 NOTE — Telephone Encounter (Signed)
 It looks at this patient might have been lost to oncology follow-up.  Please see mammogram from [facility] scanned under the media tab with recommendation for repeat diagnostic imaging in 10/2024.  Thanks.

## 2024-10-30 NOTE — Telephone Encounter (Signed)
Attempted to contact patient and no answer  

## 2024-10-30 NOTE — Telephone Encounter (Signed)
 Please advise her to schedule an appointment with the Cancer center to follow up on her breast cancer. Thanks

## 2024-12-21 ENCOUNTER — Ambulatory Visit: Payer: Self-pay | Admitting: Family Medicine
